# Patient Record
Sex: Male | Born: 1950 | Race: White | Hispanic: No | Marital: Married | Smoking: Current every day smoker
Health system: Southern US, Community
[De-identification: ages and names within clinical notes are randomized; demographics above are authoritative.]

## PROBLEM LIST (undated history)

## (undated) DIAGNOSIS — I219 Acute myocardial infarction, unspecified: Secondary | ICD-10-CM

## (undated) DIAGNOSIS — I639 Cerebral infarction, unspecified: Secondary | ICD-10-CM

## (undated) DIAGNOSIS — I779 Disorder of arteries and arterioles, unspecified: Secondary | ICD-10-CM

## (undated) DIAGNOSIS — I10 Essential (primary) hypertension: Secondary | ICD-10-CM

## (undated) DIAGNOSIS — G4733 Obstructive sleep apnea (adult) (pediatric): Secondary | ICD-10-CM

## (undated) DIAGNOSIS — N200 Calculus of kidney: Secondary | ICD-10-CM

## (undated) DIAGNOSIS — M549 Dorsalgia, unspecified: Secondary | ICD-10-CM

## (undated) DIAGNOSIS — G629 Polyneuropathy, unspecified: Secondary | ICD-10-CM

## (undated) DIAGNOSIS — I4892 Unspecified atrial flutter: Secondary | ICD-10-CM

## (undated) DIAGNOSIS — G8929 Other chronic pain: Secondary | ICD-10-CM

## (undated) DIAGNOSIS — I739 Peripheral vascular disease, unspecified: Secondary | ICD-10-CM

## (undated) DIAGNOSIS — S069XAA Unspecified intracranial injury with loss of consciousness status unknown, initial encounter: Secondary | ICD-10-CM

## (undated) DIAGNOSIS — E785 Hyperlipidemia, unspecified: Secondary | ICD-10-CM

## (undated) DIAGNOSIS — S329XXA Fracture of unspecified parts of lumbosacral spine and pelvis, initial encounter for closed fracture: Secondary | ICD-10-CM

## (undated) DIAGNOSIS — S065X9A Traumatic subdural hemorrhage with loss of consciousness of unspecified duration, initial encounter: Secondary | ICD-10-CM

## (undated) DIAGNOSIS — S42309A Unspecified fracture of shaft of humerus, unspecified arm, initial encounter for closed fracture: Secondary | ICD-10-CM

## (undated) DIAGNOSIS — S069X9A Unspecified intracranial injury with loss of consciousness of unspecified duration, initial encounter: Secondary | ICD-10-CM

## (undated) DIAGNOSIS — S065XAA Traumatic subdural hemorrhage with loss of consciousness status unknown, initial encounter: Secondary | ICD-10-CM

## (undated) DIAGNOSIS — I2510 Atherosclerotic heart disease of native coronary artery without angina pectoris: Secondary | ICD-10-CM

## (undated) HISTORY — DX: Atherosclerotic heart disease of native coronary artery without angina pectoris: I25.10

## (undated) HISTORY — DX: Unspecified atrial flutter: I48.92

## (undated) HISTORY — DX: Acute myocardial infarction, unspecified: I21.9

---

## 2003-07-11 ENCOUNTER — Encounter: Payer: Self-pay | Admitting: *Deleted

## 2003-07-11 ENCOUNTER — Observation Stay (HOSPITAL_COMMUNITY): Admission: EM | Admit: 2003-07-11 | Discharge: 2003-07-12 | Payer: Self-pay | Admitting: Emergency Medicine

## 2003-07-12 ENCOUNTER — Encounter: Payer: Self-pay | Admitting: Family Medicine

## 2003-07-20 ENCOUNTER — Encounter: Payer: Self-pay | Admitting: General Surgery

## 2003-07-20 ENCOUNTER — Ambulatory Visit (HOSPITAL_COMMUNITY): Admission: RE | Admit: 2003-07-20 | Discharge: 2003-07-20 | Payer: Self-pay | Admitting: General Surgery

## 2006-01-14 ENCOUNTER — Ambulatory Visit (HOSPITAL_COMMUNITY): Admission: RE | Admit: 2006-01-14 | Discharge: 2006-01-14 | Payer: Self-pay | Admitting: General Surgery

## 2006-02-10 ENCOUNTER — Emergency Department (HOSPITAL_COMMUNITY): Admission: EM | Admit: 2006-02-10 | Discharge: 2006-02-10 | Payer: Self-pay | Admitting: Emergency Medicine

## 2006-08-26 ENCOUNTER — Ambulatory Visit (HOSPITAL_COMMUNITY): Admission: RE | Admit: 2006-08-26 | Discharge: 2006-08-26 | Payer: Self-pay | Admitting: Family Medicine

## 2006-10-25 ENCOUNTER — Emergency Department (HOSPITAL_COMMUNITY): Admission: EM | Admit: 2006-10-25 | Discharge: 2006-10-25 | Payer: Self-pay | Admitting: *Deleted

## 2006-10-31 ENCOUNTER — Inpatient Hospital Stay (HOSPITAL_COMMUNITY): Admission: EM | Admit: 2006-10-31 | Discharge: 2006-11-05 | Payer: Self-pay | Admitting: Emergency Medicine

## 2006-12-22 ENCOUNTER — Ambulatory Visit (HOSPITAL_COMMUNITY): Admission: RE | Admit: 2006-12-22 | Discharge: 2006-12-22 | Payer: Self-pay | Admitting: Internal Medicine

## 2007-01-29 ENCOUNTER — Emergency Department (HOSPITAL_COMMUNITY): Admission: EM | Admit: 2007-01-29 | Discharge: 2007-01-29 | Payer: Self-pay | Admitting: Emergency Medicine

## 2007-05-11 ENCOUNTER — Ambulatory Visit (HOSPITAL_COMMUNITY): Admission: RE | Admit: 2007-05-11 | Discharge: 2007-05-11 | Payer: Self-pay | Admitting: Internal Medicine

## 2007-06-08 ENCOUNTER — Ambulatory Visit (HOSPITAL_COMMUNITY): Admission: RE | Admit: 2007-06-08 | Discharge: 2007-06-08 | Payer: Self-pay | Admitting: Urology

## 2007-06-15 ENCOUNTER — Ambulatory Visit (HOSPITAL_COMMUNITY): Admission: RE | Admit: 2007-06-15 | Discharge: 2007-06-15 | Payer: Self-pay | Admitting: Urology

## 2008-03-27 ENCOUNTER — Ambulatory Visit (HOSPITAL_COMMUNITY): Admission: RE | Admit: 2008-03-27 | Discharge: 2008-03-27 | Payer: Self-pay | Admitting: Urology

## 2008-04-27 ENCOUNTER — Encounter
Admission: RE | Admit: 2008-04-27 | Discharge: 2008-05-29 | Payer: Self-pay | Admitting: Physical Medicine & Rehabilitation

## 2008-04-30 ENCOUNTER — Ambulatory Visit: Payer: Self-pay | Admitting: Physical Medicine & Rehabilitation

## 2008-05-29 ENCOUNTER — Ambulatory Visit: Payer: Self-pay | Admitting: Physical Medicine & Rehabilitation

## 2008-07-23 ENCOUNTER — Encounter
Admission: RE | Admit: 2008-07-23 | Discharge: 2008-10-21 | Payer: Self-pay | Admitting: Physical Medicine & Rehabilitation

## 2008-07-24 ENCOUNTER — Ambulatory Visit: Payer: Self-pay | Admitting: Physical Medicine & Rehabilitation

## 2008-08-20 ENCOUNTER — Ambulatory Visit: Payer: Self-pay | Admitting: Physical Medicine & Rehabilitation

## 2008-09-24 ENCOUNTER — Ambulatory Visit: Payer: Self-pay | Admitting: Physical Medicine & Rehabilitation

## 2008-11-15 ENCOUNTER — Encounter
Admission: RE | Admit: 2008-11-15 | Discharge: 2009-02-13 | Payer: Self-pay | Admitting: Physical Medicine & Rehabilitation

## 2008-11-19 ENCOUNTER — Ambulatory Visit: Payer: Self-pay | Admitting: Physical Medicine & Rehabilitation

## 2009-01-02 ENCOUNTER — Ambulatory Visit: Payer: Self-pay | Admitting: Physical Medicine & Rehabilitation

## 2009-02-01 ENCOUNTER — Ambulatory Visit: Payer: Self-pay | Admitting: Physical Medicine & Rehabilitation

## 2009-02-19 ENCOUNTER — Encounter
Admission: RE | Admit: 2009-02-19 | Discharge: 2009-05-20 | Payer: Self-pay | Admitting: Physical Medicine & Rehabilitation

## 2009-03-01 ENCOUNTER — Ambulatory Visit: Payer: Self-pay | Admitting: Physical Medicine & Rehabilitation

## 2009-03-29 ENCOUNTER — Ambulatory Visit: Payer: Self-pay | Admitting: Physical Medicine & Rehabilitation

## 2009-04-26 ENCOUNTER — Ambulatory Visit: Payer: Self-pay | Admitting: Physical Medicine & Rehabilitation

## 2009-05-14 ENCOUNTER — Encounter
Admission: RE | Admit: 2009-05-14 | Discharge: 2009-08-12 | Payer: Self-pay | Admitting: Physical Medicine & Rehabilitation

## 2009-05-29 ENCOUNTER — Ambulatory Visit: Payer: Self-pay | Admitting: Physical Medicine & Rehabilitation

## 2009-06-26 ENCOUNTER — Ambulatory Visit: Payer: Self-pay | Admitting: Physical Medicine & Rehabilitation

## 2009-07-26 ENCOUNTER — Ambulatory Visit: Payer: Self-pay | Admitting: Physical Medicine & Rehabilitation

## 2010-06-18 ENCOUNTER — Ambulatory Visit: Payer: Self-pay | Admitting: Vascular Surgery

## 2010-11-02 ENCOUNTER — Encounter: Payer: Self-pay | Admitting: Family Medicine

## 2011-01-28 ENCOUNTER — Other Ambulatory Visit (HOSPITAL_COMMUNITY): Payer: Self-pay | Admitting: Urology

## 2011-01-28 ENCOUNTER — Ambulatory Visit (HOSPITAL_COMMUNITY)
Admission: RE | Admit: 2011-01-28 | Discharge: 2011-01-28 | Disposition: A | Discharge status: Home / Self Care | Payer: Medicaid Other | Source: Ambulatory Visit | Attending: Urology | Admitting: Urology

## 2011-01-28 DIAGNOSIS — N201 Calculus of ureter: Secondary | ICD-10-CM

## 2011-01-28 DIAGNOSIS — R109 Unspecified abdominal pain: Secondary | ICD-10-CM | POA: Insufficient documentation

## 2011-01-28 DIAGNOSIS — Z87442 Personal history of urinary calculi: Secondary | ICD-10-CM | POA: Insufficient documentation

## 2011-02-24 NOTE — Assessment & Plan Note (Signed)
[name] is here regarding his chronic gait deficits and left-sided  pain.  We started Keppra and Flector patches.  He reported swelling and  stopped both of them.  He also states he has problems swallowing Keppra.  He brought his bottle with him today and the pharmacist mistakenly  prescribed 750 mg instead of the 250 I wrote for.  He also reports some  muscle spasms with the medication and received Valium from his family  doctor.  Currently, he is rating his pain 7-9/10 and describes it as  sharp, burning, stabbing, aching, tingling, and constant.  Pain  interferes with general activity, relations with others, and enjoyment  of life on a moderate to severe level.  Sleep is poor.   REVIEW OF SYSTEMS:  Notable for multiple issues, all listed in the  health and history section of the chart.  Mood has been stable.  He has  had no recent falls.   SOCIAL HISTORY:  The patient lives alone.  He is still smoking a pack of  cigarettes per day.  His ex-wife is with him here today.   PHYSICAL EXAMINATION:  VITAL SIGNS:  Blood pressure is 130/71, pulse 73,  respiratory rate 18, and sating 96% on room air.  GENERAL:  The patient is pleasant, alert and oriented x3.  He is alert  and appropriate today.  He walks with a shuffling gait.  He has  difficulty advancing the left leg.  He has ongoing tightness of the knee  and ankle with tone rated at 2/4.  Strength was 1-2/5 at the knee and  ankle.  He is 2/5 at the hip, perhaps.  EXTREMITIES:  Right lower extremity is 4-4+/5.  Left wrist and hand tone  was 2-3/4.  He has some tightness as well of his shoulder.  Proximal  muscle movement in the shoulders is 4/5.  He had decreased distal  movement in the left.  Balance is poor.  HEART:  Regular.  CHEST:  Clear.  ABDOMEN:  Soft and nontender.   The patient continues to lack insight and awareness for the most part.  He likes to joke about his issues and make excuses for problems he is   experiencing.   ASSESSMENT:  1. Persistent left lower extremity pain most consistent with      neurological/neuropathic pain from his strokes and brain injury      plus or minus related to his back.  He also has peripheral      neuropathy.  2. Spastic left hemiparesis.  3. Gait disorder.  4. Obesity.  5. Anxiety disorder.  6. Poor safety judgment.   PLAN:  1. We will increase his Neurontin which he restarted to 100 mg b.i.d.  2. Begin slow titration of Baclofen going up to 10 mg b.i.d. after 3      weeks.  3. Send him for PT and OT at [facility] to work on balance,      strengthening, range of motion, spasticity management, and edema      control.  4. Consider Botox injections.  5. I will see him back in about 2 months.      Ranelle Oyster, M.D.  Electronically Signed     ZTS/MedQ  D:  05/29/2008 11:15:30  T:  05/30/2008 03:09:46  Job #:  [id number]   cc:   Prescott Parma  Fax: [phone]

## 2011-02-24 NOTE — Assessment & Plan Note (Signed)
[name] is back regarding his left side spasticity and low back pain.  He  had good results with Botox and he has had increased use of his left  upper extremity.  His left arm pain seems to be diminished.  He is  complaining of spasms down his left leg and pain down his right buttock  going down the leg.  He has had epidural injections through [facility], but states that these were not helpful.  I do not have  details of his most recent lumbar film.  The patient rates his pain a 7-  8/10.  He is still taking 6 or 7 Norco a day for pain.  The patient  states the pain is constant and interferes with his general activity and  enjoyment of life on a moderate-to-severe level.  Pain is usually worse  with walking, bending, sitting, some time standing.  He uses a cane for  balance.  He has a left shoulder harness for his arm support.   REVIEW OF SYSTEMS:  Notable for numbness, tremor, tingling, trouble  walking, weight gain, night sweats, high sugars, limb swelling,  shortness of breath, sleep apnea.  Full review is in the written health  and history section in the chart.   SOCIAL HISTORY:  The patient is married, but living alone.  He smokes  about 1 pack a day.   PHYSICAL EXAMINATION:  VITAL SIGNS:  Blood pressure 111/63, pulse is  106, respiratory rate 20, sating 96% on room air.  GENERAL:  The patient is pleasant, alert, and oriented x3.  Speech is  slightly dysarthric.  MUSCULOSKELETAL:  Pain over palpation in the right piriformis area  worsened with hip range of motion as well as direct pressure sitting on  the area.  The hip flexion caused some discomfort.  He is able to bend  in the seated position to about 30-40 degrees before pain set in.  Symptoms are overall fairly vague there.  Left upper extremity  definitely had improved from a spasticity standpoint with 1-2/5 tone at  the fingers and wrist, 1-1+/4 tone at the left elbow.  Tone in the left  lower extremity  remains 1-2/4.  Strength in the left upper extremity is  2+/5 proximal to 1+/5 distally.  Left lower extremity is 2-3/5.  HEART:  Regular.  CHEST:  Clear.  ABDOMEN:  Soft, nontender.  Remains is a extremely abdominally obese.  He is 1+ extremity edema in the left lower extremity today.   ASSESSMENT:  1. Spastic left-sided hemiparesis.  2. Peripheral neuropathy.  3. Chronic low back pain with questionable degenerative disk disease      and spondylosis.  4. Questionable right piriformis syndrome.  5. Gait disorder.  6. Obesity.  7. Anxiety.   PLAN:  1. Continue low-dose Neurontin.  2. We will start low-dose fentanyl patch 12 mcg q.72 h.  3. I feel that his Norco 10/325, #180 today.  I explained to him that      he will need to decrease the number.  He is using each month.  That      is the purpose of starting on the patch to get better baseline pain      control.  4. The patient is not anxious to pursue any injections to the prior      elevation of his blood sugars in the past.  5. I will see the patient back in about a month for Botox injections  to the left biceps, FDP, and FDS muscles totaling 300 units.      Ranelle Oyster, M.D.  Electronically Signed     ZTS/MedQ  D:  11/19/2008 14:45:24  T:  11/20/2008 04:03:24  Job #:  [id number]   cc:   Prescott Parma  Fax: [phone]

## 2011-02-24 NOTE — Procedures (Signed)
NAMEJHALEN, [name] NO.:  [id number]   MEDICAL RECORD NO.:  [id number]           PATIENT TYPE:   LOCATION:                                 FACILITY:   PHYSICIAN:  Ranelle Oyster, M.D.DATE OF BIRTH:  [birth year]   DATE OF PROCEDURE:  DATE OF DISCHARGE:                               OPERATIVE REPORT   PROCEDURE:  Botox injection, diagnostic code is 342.12, spastic  hemiparesis, nondominant limb.   After extensive discussion and description of procedure, benefits, and  side effects, the patient agreed to have Botox injection today.  He  signed informed consent.  We utilized EMG guidance to inject 3 different  muscles today, the left FDS, left FDP, and biceps brachii.  We used  approximately 100 units of botulinum toxin A in each muscle.  Each 100  unit was diluted in 1 mL of preservative-free normal saline.  The  patient tolerated without issues.  He was given post-injection  instructions.  We will send him to outpatient OT to work on aggressive  left upper extremity range of motion, modality, splinting, etc.   He can continue with Norco for breakthrough pain as well as Neurontin at  night.  Incidentally, he had recent kidney stone problems, which are  slowly improving.   I will see him back in 2 months' time.      Ranelle Oyster, M.D.  Electronically Signed     ZTS/MEDQ  D:  09/24/2008 15:26:47  T:  09/25/2008 06:05:07  Job:  [id number]   cc:   Prescott Parma  Fax: [phone]

## 2011-02-24 NOTE — Procedures (Signed)
CAROTID DUPLEX EXAM   INDICATION:  Carotid artery disease.   HISTORY:  Diabetes:  Yes.  Cardiac:  No.  Hypertension:  Yes.  Smoking:  Yes.  Previous Surgery:  No.  CV History:  Asymptomatic.  Amaurosis Fugax , Paresthesias , Hemiparesis                                       RIGHT             LEFT  Brachial systolic pressure:                           162  Brachial Doppler waveforms:                           Within normal  limits  Vertebral direction of flow:                          Antegrade  DUPLEX VELOCITIES (cm/sec)  CCA peak systolic                                     174  ECA peak systolic                                     276  ICA peak systolic                                     143  ICA end diastolic                                     51  PLAQUE MORPHOLOGY:                                    Heterogenous  PLAQUE AMOUNT:                                        Moderate  PLAQUE LOCATION:                                      CCA, ICA, ECA   IMPRESSION:  1. Known right internal carotid artery occlusion.  2. Left internal carotid artery velocities show evidence of a 40% to      59% stenosis.  3. Left common carotid artery stenosis noted.   ___________________________________________  Di Kindle. Edilia Bo, M.D.   EM/MEDQ  D:  06/18/2010  T:  06/18/2010  Job:  [id number]

## 2011-02-24 NOTE — Assessment & Plan Note (Signed)
[name] is back regarding his chronic gait deficit and left-sided pain.  He is unable to tolerate the baclofen as he stated they increased his  spasms rather than decrease them.  He did not go to outpatient therapy  due to transportation issues and kidney stone.  He started the  Neurontin at 100 mg at bedtime and it seems to have helped his burning  pain.  He rates the pain at 7/10 on average.  He frequently complains of  a lot of pain in the left shoulder as well as the hip and leg after a  fall.  Pain interferes with general activity, relations with others,  enjoyment of life in a mild-to-severe level.  Sleep is fair now.   REVIEW OF SYSTEMS:  Notable for numbness and spasms in the left side,  tingling, night sweats, urinary retention, wheezing, and cough with  recent bronchitis apparently.  Other pertinent positives are above and  full review is in the written health and history section in the chart.   SOCIAL HISTORY:  The patient is married but lives separately and smokes  half a pack of cigarettes per day.   PHYSICAL EXAMINATION:  VITAL SIGNS:  Blood pressure is 111/68, pulse is  75, respiratory rate 18 and he is saturating 97% on room air.  The patient walks with shuffling gait and is somewhat unstable.  Left  leg is difficult to advance.  It has continued tightness in left knee  and ankle, rated at 2/4.  Left wrist and elbow also 2-3/4.  Strength is  1/2 in the left knee and ankle and 2- to 2 at the hip with flexion.  Left upper extremity is 1-2/5 strength.  The patient had pain with  rotational movements and impingement maneuvers in the left shoulder  today.  Speech remains dysarthric.  The patient has poor insight and  awareness overall.  He can be a little bit irritable at times.  HEART:  Regular.  CHEST:  Clear.  ABDOMEN:  Soft and nontender.  EXTREMITIES:  The patient had bruises down the left side and leg today.   ASSESSMENT:  1. Persistent left lower extremity pain and  spasticity related to      prior strokes and brain injury.  2. Peripheral neuropathy.  3. Dysplastic left hemiparesis.  4. Gait disorder.  5. Obesity.  6. Anxiety disorder.   PLAN:  1. Continue Neurontin 100 mg nightly.  2. Stop baclofen.  3. We will set the patient up for Botox injections in left biceps,      pronator teres, and flexor digitorum sublimis and flexor digitorum      profundus.  4. After an informed consent, we injected the left shoulder via      lateral approach, 40 mg Kenalog and 3 mL of 1% lidocaine.  5. We look at home health therapies after Botox injections.  6. I gave him 120 of Norco 10/325 today.      Ranelle Oyster, M.D.  Electronically Signed     ZTS/MedQ  D:  07/24/2008 12:56:02  T:  07/25/2008 03:34:57  Job #:  [id number]   cc:   Prescott Parma  Fax: [phone]

## 2011-02-24 NOTE — Assessment & Plan Note (Signed)
OFFICE VISIT   [name], [name]  DOB:  1950/12/01                                       06/18/2010  CHART#:[id number]   This patient has a known right internal carotid artery occlusion and we  have been following a moderate left carotid stenosis.  I have not seen  him since February of 2007.  He had a previous right hemispheric stroke  associated with significant left-sided weakness.  He has had no new  change in his symptoms.  He continues to have weakness on the left upper  extremity and left lower extremity.  He is ambulatory with a cane.  He  has had no expressive or receptive aphasia, no amaurosis fugax.   His past medical history is significant for adult onset diabetes,  hypertension and hypercholesterolemia.  He denies any history of  previous myocardial infarction, history of congestive heart failure or  history of COPD.   SOCIAL HISTORY:  He is separated.  He does smoke a pack per day of  cigarettes or less.   REVIEW OF SYSTEMS:  CARDIOVASCULAR:  He has had no chest pain, chest  pressure, palpitations or arrhythmias.  He does admit to some  claudication.  He has had no history of DVT.  PULMONARY:  He does have a history of sleep apnea and is on CPAP.  GU:  He does have some urinary frequency and nocturia.  MUSCULOSKELETAL:  He does have joint pain and muscle pain.   PHYSICAL EXAMINATION:  General:  This is a pleasant 60 year old  gentleman who appears his stated age.  Vital signs:  Blood pressure is  134/65, heart rate is 77 and temperature is 97.8.  Lungs:  Are clear  bilaterally to auscultation without rales, rhonchi or wheezing.  Cardiovascular:  I do not detect any carotid bruits.  He has a regular  rate and rhythm.  He has no significant peripheral edema.  Both feet are  warm and well-perfused.  Abdomen:  Soft with normal pitched bowel  sounds.  Neurologic:  He has profound left upper extremity weakness and  also left lower extremity  weakness but he is ambulatory with a brace on  the left leg.  Skin:  There are no ulcers or rashes.   I have independently interpreted his carotid duplex scan today which  shows a 40%-59% left carotid stenosis.  I compared the velocities to his  previous study from 2 years ago and there has been no significant  change.   Will continue to obtain yearly followup duplex scans.  I plan on seeing  him back in 2 years.  He understands we would not consider left carotid  endarterectomy unless the stenosis progressed to greater than 80%.  He  does know to continue taking his aspirin.     Di Kindle. Edilia Bo, M.D.  Electronically Signed   CSD/MEDQ  D:  06/18/2010  T:  06/19/2010  Job:  [id number]

## 2011-02-24 NOTE — Assessment & Plan Note (Signed)
[name] is back regarding his chronic back and left-sided pain as related  to his neurological disorders.  He decided that he does not want Botox.  He has spoken to many friends and read the information about the  medication and now has concerns about side effects.  He has had recent  surgery for kidney stones.  He has been doing some therapy at home and  is moving better.  He is requesting 8 Norco per day.  Left shoulder  injection may have helped him at last visit.  He rates his pain 7/10 in  general, describes as constant aching.  Pain interferes with his general  activity, relations with others, and enjoyment of life on a moderate  level.   REVIEW OF SYSTEMS:  This patient reports multiple issues such as  numbness, tingling, some weight gain, urinary retention, lower extremity  swelling, occasional dizziness, and spasms.  Full review is in the  written health and history section.   SOCIAL HISTORY:  The patient is married, but lives separately.  He is  smoking 1/2-1 pack of cigarettes per day.   PHYSICAL EXAMINATION:  VITAL SIGNS:  Blood pressure is 136/70, pulse 81,  respiratory rate 20, and he is sating 94% on room air.  GENERAL:  The patient is pleasant, alert and oriented x3.  The patient  is generally alert.  His speech is dysarthric.  He walks with fair  balance although he tends to rotate the left leg externally to help  clear it.  He has his left lower upright AFO in place.  Left upper  extremity is moving a bit better with 2/5 strength proximal to 1/5 to  1+/5 distally.  Tone is generally 2-3/4 in the left upper extremity.  It  is 1-2 in the left lower extremity.  The patient has poor insight and  awareness.  He can still be quite irritable and cranky at times although  he is a bit more pleasant overall today.  HEART:  Regular.  CHEST:  Clear.  ABDOMEN:  Soft and nontender.  EXTREMITIES:  1+ edema in the left lower extremity.   ASSESSMENT:  1. Persistent left lower  extremity pain spasticities related to stroke      and brain injury.  2. Peripheral neuropathy.  3. Low back pain.  4. Gait disorder.  5. Obesity.  6. Anxiety disorder.   PLAN:  1. Continue Neurontin 100 mg nightly.  2. Discussed Botox injections today.  The patient is still hesitant to      go forward with these.  In lieu of this, we will try some tendon      stretching at home.  He would like to go to outpatient therapy, but      wants to have his kidney stones resolved first before proceeding.  3. Agreed to write for 6 Norco 10/325 per day.  We will try to slowly      wean him down.  4. Overall, options are limited due to patient's intolerance and      refusal to use particular treatments and medications.  We will do      the best we can with conservative measures.  I will see him back in      2 months.      Ranelle Oyster, M.D.  Electronically Signed     ZTS/MedQ  D:  08/20/2008 13:14:40  T:  08/21/2008 02:14:06  Job #:  [id number]   cc:   Prescott Parma  Fax:  [phone] 

## 2011-02-24 NOTE — H&P (Signed)
[name], [name]                ACCOUNT NO.:  [id number]   MEDICAL RECORD NO.:  [id number]          PATIENT TYPE:  AMB   LOCATION:  DAY                           FACILITY:  [facility]   PHYSICIAN:  Dennie Maizes, M.D.   DATE OF BIRTH:  04-17-1951   DATE OF ADMISSION:  06/08/2007  DATE OF DISCHARGE:  LH                              HISTORY & PHYSICAL   CHIEF COMPLAINT:  Left lower quadrant abdominal pain, large left renal  calculus.   HISTORY OF PRESENT ILLNESS:  This 60 year old male was referred to me  from the emergency room at [facility].  He had left lower  quadrant abdominal pain and left flank pain about three months ago.  He  also had mild hematuria.  A noncontrast CT scan of the abdomen and  pelvis revealed a 9 mm sized left renal calculus located in the  ureteropelvic junction with obstruction.  Moderate left hydronephrosis  also noted.  Multiple nonobstructing renal calculi are noted in the left  kidney.  The largest stone measured about 13 mm in size.  Patient has  undergone ESWL of the left obstructing ureteral calculus.  The stone was  fragmented, and he has passed stone fragments.  He also has passed  several stones from the left kidney.  He still has a 13 x 9 mm sized  stone in the left kidney.  He is brought to the hospital day for  extracorporeal shock wave lithotripsy of the large left renal calculus.  The patient denied having any fevers, chills, sweating, difficulty,  adverse hematuria at present.  Urinary frequency x3-4 and nocturia x3-4.   PAST MEDICAL HISTORY:  1. History of recurrent ureterolithiasis, status post ESWL of left      ureterolithiasis in April, 2008.  2. Diabetes mellitus.  3. Hypertension.  4. Hypercholesterolemia.  5. Status post CVA x3 in 1999.  6. History of pelvic fracture.   MEDICATIONS:  1. Hydrocodone with APAP p.r.n. pain.  2. Diltiazem 240 mg p.o. daily.  3. Xanax 1 mg 1 p.o. 3 times a day.  4. Glucophage 500 mg 1 p.o.  daily.  5. Crestor 1 p.o. daily.  6. Actos 40 mg 1 p.o. daily.  7. Nexium 40 mg 1 p.o. daily.  8. Paxil 10 mg 1 p.o. daily.  9. Plavix 75 mg 1 p.o. daily, which had been discontinued for the      lithotripsy.   ALLERGIES:  MORPHINE, FLOMAX.   PHYSICAL EXAMINATION:  HEENT:  Normal.  Patient has left hemiparesis.  LUNGS:  Clear to auscultation.  HEART:  Regular rate and rhythm.  No murmurs.  ABDOMEN:  Soft.  No palpable flank mass or costovertebral angle  tenderness.  The liver is not palpable.  Penis and testes are normal.  RECTAL:  Prostate 40 gm size.   IMPRESSION:  Left renal calculus with left flank pain.   PLAN:  Extracorporeal shock wave lithotripsy to the left renal calculus  with  iv sedation in the hospital.  I informed the patient and his  family regarding the diagnosis, operative details, alternative  treatments, outcome, possible risks and complications.  He has agreed  for the procedure to be done.  Dennie Maizes, M.D.  Electronically Signed     SK/MEDQ  D:  06/08/2007  T:  06/08/2007  Job:  [id number]   cc:   Prescott Parma  Fax: [phone]

## 2011-02-24 NOTE — Assessment & Plan Note (Signed)
[name] is back regarding his spastic left hemiparesis and pain syndrome.  He has done well with the 15 mg of Opana ER, but for whatever reason he  was switched back to the 10.  I think it may have been an error by my  nursing staff which I did not catch before one of his nursing visits.  He liked the 15 and was doing better with this.  He is able to use a bit  less hydrocodone as well.  He rates his pain 7-8/10.  He is working on  some stretching at home but not over aggressively.   REVIEW OF SYSTEMS:  Notable for numbness, trouble walking, muscle  spasms, weight loss, occasional sugar increases, and limb swelling in  left leg.  Full 14-point review is in the written health and history  section.  Other pertinent positives are above.   SOCIAL HISTORY:  Unchanged.  He is separated, but wife lives with him in  his house.  He smokes a pack of cigarettes per day.   PHYSICAL EXAMINATION:  Blood pressure is 131/72, pulse is 76,  respiratory rate 18.  He is sating 96% on room air.  The patient is  pleasant, alert and oriented x3.  He is jovial today.  Speech is  intelligible but dysarthric still.  Strength is still 1-2/5 proximal  greater than distal in the left upper extremity where it is 2-3/5 left  lower extremity.  He still has tone at 1+ to 2 in the left upper and 1/4  left lower today.  Reflexes are 3+.  There is trace edema seen in the  left arm and trace to 1+ left leg today.  Heart is regular rate.  Chest  is clear.  Abdomen is soft, nontender.  Cognitively, he is intact.  The  patient's left AFOs is in place.   ASSESSMENT:  1. Spastic left hemiparesis.  2. Peripheral neuropathy.  3. Chronic low back pain related to spondylosis and disk disease.  4. Right piriformis and greater trochanter syndrome.  5. Gait disorder.  6. Obesity.   PLAN:  1. Resume Opana ER at 15 mg q.12 h.  2. To reduced Norco to 10/325 one q.4 h. p.r.n. #150.  3. Encourage stretching and better health  hygiene.  4. We will see him back in 3 months with me, in 1 month in the nursing      clinic.      Ranelle Oyster, M.D.  Electronically Signed     ZTS/MedQ  D:  05/29/2009 11:19:16  T:  05/30/2009 00:42:33  Job #:  [id number]

## 2011-02-24 NOTE — Procedures (Signed)
NAMEJORDIN, [name]                ACCOUNT NO.:  [id number]   MEDICAL RECORD NO.:  [id number]          PATIENT TYPE:  REC   LOCATION:  [facility]                          FACILITY:  [facility]   PHYSICIAN:  Ranelle Oyster, M.D.DATE OF BIRTH:  Jun 20, 1951   DATE OF PROCEDURE:  01/02/2009  DATE OF DISCHARGE:                               OPERATIVE REPORT   PROCEDURE:  Botox injection, diagnostic code 342.12.   DESCRIPTION OF PROCEDURE:  After informed consent, preparation of skin  with isopropyl alcohol.  We utilized EMG stimulation to localize the FDP  and FDS musculature as well as the biceps.  The patient had substantial  involuntary activity of these muscle groups and we injected a  approximately 150 units into the forearm flexor bed, as well as 150  units into the biceps bed.  The patient tolerated well without side  effects and agreed for therapy in about 10-14 days.   PLAN:  1. Refill Norco 10/325 #180 and we started OPANA ER 5 mg q.12 h.      discussing potential side effects.  2. I will see him back in about 2 months with 1 month in Nurse Clinic.      Ranelle Oyster, M.D.  Electronically Signed     ZTS/MEDQ  D:  01/02/2009 13:22:46  T:  01/03/2009 02:24:05  Job:  [id number]   cc:   Prescott Parma  Fax: [phone]

## 2011-02-24 NOTE — Assessment & Plan Note (Signed)
[name] is back regarding his multiple pain issues.  His left arm has  responded somewhat to the Botox.  However, he is not actively stretching  his arm on a regular basis.  His right leg and hip given him some  problems with pain radiating down the leg.  It seems to bother him more  when he sits for a prolonged periods of time.  He rates his pain as 7/10  today, described it as aching.  He is on the Opana ER 10 mg q.12 h.  currently with the Norco for breakthrough pain.  He is still using 6 of  these a day.  He does feel that he is using less somehow.   REVIEW OF SYSTEMS:  Notable for numbness, confusion, loss of taste and  dizziness, weight gain with sugars, limb swelling still on the left arm  and leg.  In fact, he is seeing Dr. Lajoyce Corners regarding his left foot in the  next week or two.  Full review is in the written health and history  section of the chart.   SOCIAL HISTORY:  Unchanged.  The patient's wife is with him today, but  he is separated.  He is still smoking a pack of cigarettes per day.   PHYSICAL EXAMINATION:  VITAL SIGNS:  Blood pressure is 123/73, pulse is  61, respiratory rate 18, he is sating 95% on room air.  GENERAL:  The patient is pleasant, alert, and oriented x3.  Affect is  generally appropriate.  Speech is clear today.  Strength is stable in  the left upper extremity at 2+/5 proximally and 1/5 distally.  Tone  underlying is 1-2/4 still, but closer to 1.  Left lower extremity  strength is 2-3/5 with some tone at 1/4 underlying.  NEURO:  Sensory exam is unchanged.  Cranial nerve exam is stable.  HEART:  Regular.  CHEST:  Clear.  ABDOMEN:  Soft, nontender.  EXTREMITIES:  He has 1+ edema in the left lower extremity and trace  edema in the left upper extremity today.  Right hip is painful with  palpation of the greater trochanter just posteriorly.  The patient also  has pain there with straight leg raising and rotation of the hip.  The  patient walks with his left AFO  and straight cane and is fairly stable.  He needs extra time to stand from a sitting position.   ASSESSMENT:  1. Spastic left hemiparesis.  2. Peripheral neuropathy.  3. Chronic low back pain related to spondylosis and disk disease.  4. Right piriformis/greater trochanter syndrome.  5. Gait disorder.  6. Obesity.   PLAN:  1. We will stay with Opana ER, but increase to 15 mg q.12 h.  2. I want to see his Norco continue to decrease, I wanted to see him      down to 3-4 a day in the near future for breakthrough pain.  3. He needs to stay after his exercises for stretching, particularly      in the left upper extremities if he hopes to maintain some of the      progress he has made.  4. Recommended ice and stretching to the right hip as well.  I gave      the patient samples of Flector patches.  Could      consider injection to greater trochanter, but they are concerned      with elevating his sugars obviously.  5. I will see him back in 2 months with  52-month nurse followup.      Ranelle Oyster, M.D.  Electronically Signed     ZTS/MedQ  D:  03/29/2009 12:01:53  T:  03/30/2009 02:59:37  Job #:  [id number]   cc:   Prescott Parma  Fax: [phone]

## 2011-02-24 NOTE — H&P (Signed)
[name], [name]                ACCOUNT NO.:  [id number]   MEDICAL RECORD NO.:  [id number]          PATIENT TYPE:  AMB   LOCATION:  DAY                           FACILITY:  [facility]   PHYSICIAN:  Dennie Maizes, M.D.   DATE OF BIRTH:  1950-11-25   DATE OF ADMISSION:  05/10/2007  DATE OF DISCHARGE:  LH                              HISTORY & PHYSICAL   CHIEF COMPLAINT:  Left lower quadrant abdominal pain, large left renal  calculus.   HISTORY OF PRESENT ILLNESS:  This 60 year old male is referred to me  from the ER at [facility].  He had left lower quadrant  abdominal pain and left flank pain about 3 months ago.  He also had mild  hematuria.  A noncontrast CT scan of the abdomen and pelvis revealed a 9-  mm-size left renal calculus located in the ureterovesical junction with  obstruction.  Moderate left hydronephrosis was noted.  Multiple  nonobstructive renal stones were noted in the left kidney; the largest  one measured about 13 mm in size.   The patient has undergone ESL of the left obstructing ureteral calculus.  Stone was fragmented and he has passed the stone fragments.  He also has  passed several stones from the left kidney, but he still has the 13 x 9-  mm stone in the left kidney.  He is brought to the [facility] today  for extracorporeal shock wave lithotripsy of the large left renal  calculus.   The patient denied having any fevers, chills, voiding difficulty or  gross hematuria present.  He has urinary frequency x3-4 and nocturia x3-  4.   PAST MEDICAL HISTORY:  1. Status post ESL of left ureteral calculus in April 2008.  2. Diabetes mellitus.  3. Hypertension.  4. Hypercholesterolemia.  5. Status post CVA x2 in 1999.  6. History of pelvic fracture.   MEDICATIONS:  1. Hydrocodone with APAP.  2. Diltiazem 240 mg one p.o. daily.  3. Xanax 1 mg one p.o. t.i.d.  4. Glucophage 500 mg one p.o. daily.  5. Crestor one p.o. daily.  6. Actos 40 mg one p.o.  daily.  7. Nexium 40 mg one p.o. daily.  8. Ticlid 150 mg one p.o. daily.  9. Paxil 10 mg one p.o. daily.  10.Plavix 75 mg one p.o. daily, which has been discontinued for the      lithotripsy.   ALLERGIES:  MORPHINE and FLOMAX.   FAMILY HISTORY:  Positive for diabetes mellitus.   PHYSICAL EXAMINATION:  HEENT:  Normal.  The patient has left  hemiparesis.  LUNGS:  Clear to auscultation.  HEART:  Regular rate and rhythm, no murmurs.  ABDOMEN:  Soft.  No palpable flank mass or costovertebral angle  tenderness.  Bladder is not palpable.  GU:  Penis and testicles are normal.  RECTAL:  Forty-gram-size benign prostate.   IMPRESSION:  Large left renal calculus with left flank pain.   PLAN:  Extracorporeal shock wave lithotripsy of left renal calculus with  [facility]  I have informed the patient and  his  family regarding the diagnosis, operative details, alternative  treatment, the outcome, possible risks and complications and he has  agreed for the procedure to be done.      Dennie Maizes, M.D.  Electronically Signed     SK/MEDQ  D:  05/11/2007  T:  05/11/2007  Job:  [id number]   cc:   Dr. Louretta Shorten   [facility]
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
## 2011-02-27 NOTE — H&P (Signed)
[name], [name]                ACCOUNT NO.:  [id number]   MEDICAL RECORD NO.:  [id number]          PATIENT TYPE:  INP   LOCATION:  A314                          FACILITY:  [facility]   PHYSICIAN:  Kingsley Callander. Ouida Sills, MD       DATE OF BIRTH:  Jun 14, 1951   DATE OF ADMISSION:  10/31/2006  DATE OF DISCHARGE:  LH                              HISTORY & PHYSICAL   CHIEF COMPLAINT:  Left hip pain.   HISTORY OF PRESENT ILLNESS:  This patient is an 60 year old white male  patient of Dr. Delbert Harness who fell last Monday and has had pain in the  left hip since that time.  He was evaluated initially and found to have  no fracture by x-ray.  He has not been able to walk though and came back  to the emergency room by ambulance because of increased difficulty with  ambulation and with pain.  A CT scan was obtained which revealed  avascular necrosis of the femoral head.  No fracture was identified.   PAST MEDICAL HISTORY:  1. Stroke.  2. Hypertension.  3. Diabetes for 19 years.  4. Closed head injury in 2004.  5. Hyperlipidemia.  6. Left ankle surgery.   MEDICATIONS:  1. Hydrocodone.  2. Acetaminophen 10/325, typically four per day.  3. Cardizem CD 240 mg daily.  4. Xanax 1 mg t.i.d.  5. Glucophage 500 mg daily.  6. Flomax 0.4 mg every other day.  7. Crestor 10 mg daily.  8. Actos 45 mg daily.  9. Plavix 75 mg daily.  10.Triglide 160 mg daily.   ALLERGIES:  MORPHINE.   SOCIAL HISTORY:  He does not smoke, drink, or use drugs.   FAMILY HISTORY:  His mother is alive and has diabetes.  His father died  of a self-inflicted gunshot wound.   REVIEW OF SYSTEMS:  He has been constipated.  He denied vomiting, chest  pain, abdominal pain.  He has intermittent nocturia and urinary  frequency which he self-adjusts his Flomax dose for.   PHYSICAL EXAMINATION:  VITAL SIGNS:  Temperature 98.2, blood pressure  122/74, pulse 67, respirations 24, oxygen saturation 99%.  GENERAL:  Alert.  He does not appear  uncomfortable.  HEENT:  No scleral icterus.  Pharynx is unremarkable.  NECK:  No JVD or thyromegaly.  LUNGS:  Clear.  HEART:  Regular with no murmurs.  ABDOMEN:  Overweight, nontender.  No hepatosplenomegaly.  EXTREMITIES:  He has marked tenderness with minimal internal or external  rotation of the left hip.  He has very limited flexion and extension of  the hip.  No cyanosis, clubbing or edema.  NEUROLOGIC:  He has a partial left hemiparesis and some mild slurring of  his speech.  SKIN:  Intact.   LABORATORY DATA:  White count 6.8, hemoglobin 13.7, platelets 197,000.  Sodium 138, potassium 3.8, bicarb 27, glucose 111, BUN 16, creatinine  0.9, calcium 8.7.   An x-ray of the left hip has revealed no fracture.  A CT scan reveals  avascular necrosis of the left femoral head.  A CT scan  of the brain  revealed an old right middle cerebral artery infarct, an old right basal  ganglia infarct, and a probable old fracture in the medial wall of the  left orbit.   IMPRESSION:  1. Avascular necrosis of the left femoral head.  He will require      hospitalization for further management.  An orthopedic consultation      will be obtained.  2. Diabetes.  Continue metformin and Actos.  3. Hypertension.  Continue diltiazem.  4. History of stroke.  Continue Plavix.      Kingsley Callander. Ouida Sills, MD  Electronically Signed     ROF/MEDQ  D:  11/01/2006  T:  11/01/2006  Job:  [id number]   cc:   Melvyn Novas, MD  Fax: [phone]
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
## 2011-02-27 NOTE — Consult Note (Signed)
[name], [name]                ACCOUNT NO.:  [id number]   MEDICAL RECORD NO.:  [id number]          PATIENT TYPE:  INP   LOCATION:  A314                          FACILITY:  [facility]   PHYSICIAN:  J. Darreld Mclean, M.D. DATE OF BIRTH:  06-25-1951   DATE OF CONSULTATION:  11/02/2006  DATE OF DISCHARGE:                                 CONSULTATION   The patient's MRI shows fractured superior and inferior pubic rami on  the left.  This accounts for his pain.  He does have focal avascular  necrosis of the hip on the left post abductor muscle strain.   PLAN:  Continue his medication.  Continue his PT/OT with walker.  He can  go home when he is able to ambulate.  Very slow progress yesterday with  PT.  He has had a stroke, and it has affected his ambulation.   I can see him in the office approximately a week after discharge.  Call  and make arrangements.           ______________________________  J. Darreld Mclean, M.D.     JWK/MEDQ  D:  11/02/2006  T:  11/02/2006  Job:  [id number]

## 2011-02-27 NOTE — Discharge Summary (Signed)
NAMECORDELRO, GAUTREAU NO.:  [id number]   MEDICAL RECORD NO.:  [id number]          PATIENT TYPE:  INP   LOCATION:  A314                          [facility]ACILITY:  [facility]   PHYSICIAN:  Melvyn Novas, MDDATE OF BIRTH:  16-Jan-1951   DATE OF ADMISSION:  DATE OF DISCHARGE:  LH                               DISCHARGE SUMMARY   The patient is a 60 year old white male who has a history of  hypertension status post CVA, type 2 diabetes, hyperlipidemia and left  ankle surgery with chronic pain, who was admitted complaining of a 5-day  history of left hip pain. An MRI was done considering cervical  radiculopathy; LS spine showed no significant cause for this lesion. X-  rays of his hip were also done which were essentially negative. The  patient presented 3 days later to the ER and an MRI was performed of his  hip which showed fracture of the superior and inferior pubic rami on the  left which probably accounted for his pain, and a focal area of  avascular necrosis of the left hip with a left posterior abductor muscle  strain. The patient was admitted, given IV analgesia, also given  prophylaxis for DVT. Diet was adjusted. He was continued on his  diabetic, hypertensive and antilipidemic medicines. He was seen in  consultation with orthopedic surgery who recommended rehab short term  stay at [facility] center. Patient and wife agreed. Risks and  benefit of proposed plan explained to them.   DISCHARGE MEDICATIONS:  1. Vicodin 7.5/500 x4 daily p.r.n.  2. Cardizem CD 240.  3. Xanax 1 mg p.o. t.i.d.  4. Glucophage 500 mg b.i.d.  5. Flomax 0.4 daily.  6. Crestor 10 mg daily.  7. Actos 45 mg daily.  8. Plavix 75 mg daily.  9. Triad 160 mg daily.   The patient should probably have long term anticoagulation with  Coumadin. Will defer to the attending physician, depending on patient's  level of mobility or immobility in the rehab phase.      Melvyn Novas, MD  Electronically Signed     RMD/MEDQ  D:  11/04/2006  T:  11/04/2006  Job:  [id number]

## 2011-06-17 ENCOUNTER — Other Ambulatory Visit (INDEPENDENT_AMBULATORY_CARE_PROVIDER_SITE_OTHER): Payer: Medicaid Other | Admitting: Vascular Surgery

## 2011-06-17 DIAGNOSIS — I6529 Occlusion and stenosis of unspecified carotid artery: Secondary | ICD-10-CM

## 2011-06-24 ENCOUNTER — Encounter: Payer: Self-pay | Admitting: Vascular Surgery

## 2011-06-24 NOTE — Procedures (Unsigned)
CAROTID DUPLEX EXAM  INDICATION:  Follow up carotid stenosis.  HISTORY: Diabetes:  Yes. Cardiac:  No. Hypertension:  Yes. Smoking:  Currently. Previous Surgery:  No. CV History:  History of CVA with residual left-sided weakness, no new symptoms today. Amaurosis Fugax No, Paresthesias Yes, Hemiparesis Yes.                                      RIGHT             LEFT Brachial systolic pressure:         128               158 Brachial Doppler waveforms:         WNL               WNL Vertebral direction of flow:        Not evaluated     Antegrade DUPLEX VELOCITIES (cm/sec) CCA peak systolic                                     169 ECA peak systolic                                     207 ICA peak systolic                                     112 ICA end diastolic                                     41 PLAQUE MORPHOLOGY:                                    Heterogenous PLAQUE AMOUNT:                                        Moderate PLAQUE LOCATION:                                      CCA/ICA/ECA  IMPRESSION: 1. Right carotid artery system not evaluated on today's examination     with known history of right internal carotid artery occlusion. 2. Left common carotid artery disease present. 3. Left external carotid artery stenosis present. 4. Left internal carotid artery stenosis in the 40% to 59% range. 5. Essentially unchanged since the previous study on 06/18/2010.  ___________________________________________ Di Kindle. Edilia Bo, M.D.  SH/MEDQ  D:  06/17/2011  T:  06/17/2011  Job:  [id number]

## 2011-07-02 ENCOUNTER — Other Ambulatory Visit: Payer: Self-pay | Admitting: Vascular Surgery

## 2011-07-02 DIAGNOSIS — I6529 Occlusion and stenosis of unspecified carotid artery: Secondary | ICD-10-CM

## 2011-07-08 ENCOUNTER — Encounter: Payer: Self-pay | Admitting: Cardiology

## 2011-07-24 LAB — BASIC METABOLIC PANEL
BUN: 14
Chloride: 107
Creatinine, Ser: 0.95
GFR calc non Af Amer: >60
Potassium: 4.1
Sodium: 138

## 2011-07-27 LAB — BASIC METABOLIC PANEL
Calcium: 8.5
Potassium: 3.7

## 2011-07-30 ENCOUNTER — Ambulatory Visit (HOSPITAL_COMMUNITY)
Admission: RE | Admit: 2011-07-30 | Discharge: 2011-07-30 | Disposition: A | Discharge status: Home / Self Care | Payer: Medicaid Other | Source: Ambulatory Visit | Attending: Urology | Admitting: Urology

## 2011-07-30 ENCOUNTER — Other Ambulatory Visit (HOSPITAL_COMMUNITY): Payer: Self-pay | Admitting: Urology

## 2011-07-30 DIAGNOSIS — N200 Calculus of kidney: Secondary | ICD-10-CM

## 2011-07-30 DIAGNOSIS — R109 Unspecified abdominal pain: Secondary | ICD-10-CM | POA: Insufficient documentation

## 2011-10-23 ENCOUNTER — Encounter: Payer: Self-pay | Admitting: Cardiology

## 2011-12-29 ENCOUNTER — Other Ambulatory Visit: Payer: Medicaid Other

## 2012-03-11 ENCOUNTER — Emergency Department (HOSPITAL_COMMUNITY): Payer: Medicaid Other

## 2012-03-11 ENCOUNTER — Encounter (HOSPITAL_COMMUNITY): Payer: Self-pay | Admitting: *Deleted

## 2012-03-11 ENCOUNTER — Emergency Department (HOSPITAL_COMMUNITY)
Admission: EM | Admit: 2012-03-11 | Discharge: 2012-03-11 | Disposition: A | Discharge status: Home / Self Care | Payer: Medicaid Other | Attending: Emergency Medicine | Admitting: Emergency Medicine

## 2012-03-11 DIAGNOSIS — M25519 Pain in unspecified shoulder: Secondary | ICD-10-CM | POA: Insufficient documentation

## 2012-03-11 DIAGNOSIS — F17200 Nicotine dependence, unspecified, uncomplicated: Secondary | ICD-10-CM | POA: Insufficient documentation

## 2012-03-11 DIAGNOSIS — I10 Essential (primary) hypertension: Secondary | ICD-10-CM | POA: Insufficient documentation

## 2012-03-11 DIAGNOSIS — S0100XA Unspecified open wound of scalp, initial encounter: Secondary | ICD-10-CM | POA: Insufficient documentation

## 2012-03-11 DIAGNOSIS — S40019A Contusion of unspecified shoulder, initial encounter: Secondary | ICD-10-CM | POA: Insufficient documentation

## 2012-03-11 DIAGNOSIS — E119 Type 2 diabetes mellitus without complications: Secondary | ICD-10-CM | POA: Insufficient documentation

## 2012-03-11 DIAGNOSIS — S40012A Contusion of left shoulder, initial encounter: Secondary | ICD-10-CM

## 2012-03-11 DIAGNOSIS — S0101XA Laceration without foreign body of scalp, initial encounter: Secondary | ICD-10-CM

## 2012-03-11 HISTORY — DX: Cerebral infarction, unspecified: I63.9

## 2012-03-11 HISTORY — DX: Essential (primary) hypertension: I10

## 2012-03-11 MED ORDER — LIDOCAINE HCL (PF) 2 % IJ SOLN
10.0000 mL | Freq: Once | INTRAMUSCULAR | Status: AC
  Administered 2012-03-11: 10 mL

## 2012-03-11 MED ORDER — LIDOCAINE HCL (PF) 2 % IJ SOLN
INTRAMUSCULAR | Status: AC
  Administered 2012-03-11: 10 mL
  Filled 2012-03-11: qty 10

## 2012-03-11 MED ORDER — HYDROCODONE-ACETAMINOPHEN 5-325 MG PO TABS
1.0000 | ORAL_TABLET | Freq: Once | ORAL | Status: AC
  Administered 2012-03-11: 1 via ORAL
  Filled 2012-03-11: qty 1

## 2012-03-11 MED ORDER — TETANUS-DIPHTH-ACELL PERTUSSIS 5-2.5-18.5 LF-MCG/0.5 IM SUSP
0.5000 mL | Freq: Once | INTRAMUSCULAR | Status: AC
  Administered 2012-03-11: 0.5 mL via INTRAMUSCULAR
  Filled 2012-03-11: qty 0.5

## 2012-03-11 NOTE — ED Notes (Signed)
Pt in xray

## 2012-03-11 NOTE — Discharge Instructions (Signed)
Staples will need to be removed in 7-10 days. Continue to take your regular medicines.

## 2012-03-11 NOTE — ED Notes (Signed)
Pain med given for body aches
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
## 2012-03-11 NOTE — ED Notes (Signed)
Tet given to R deltoid, not Left

## 2012-03-12 HISTORY — PX: FRACTURE SURGERY: SHX138

## 2012-05-29 ENCOUNTER — Encounter (HOSPITAL_COMMUNITY): Payer: Self-pay | Admitting: *Deleted

## 2012-05-29 ENCOUNTER — Emergency Department (HOSPITAL_COMMUNITY): Payer: Medicaid Other

## 2012-05-29 ENCOUNTER — Emergency Department (HOSPITAL_COMMUNITY)
Admission: EM | Admit: 2012-05-29 | Discharge: 2012-05-29 | Disposition: A | Discharge status: Home / Self Care | Payer: Medicaid Other | Attending: Emergency Medicine | Admitting: Emergency Medicine

## 2012-05-29 DIAGNOSIS — M545 Low back pain, unspecified: Secondary | ICD-10-CM | POA: Insufficient documentation

## 2012-05-29 DIAGNOSIS — E119 Type 2 diabetes mellitus without complications: Secondary | ICD-10-CM | POA: Insufficient documentation

## 2012-05-29 DIAGNOSIS — R209 Unspecified disturbances of skin sensation: Secondary | ICD-10-CM | POA: Insufficient documentation

## 2012-05-29 DIAGNOSIS — I10 Essential (primary) hypertension: Secondary | ICD-10-CM | POA: Insufficient documentation

## 2012-05-29 DIAGNOSIS — Z79899 Other long term (current) drug therapy: Secondary | ICD-10-CM | POA: Insufficient documentation

## 2012-05-29 DIAGNOSIS — R109 Unspecified abdominal pain: Secondary | ICD-10-CM | POA: Insufficient documentation

## 2012-05-29 DIAGNOSIS — F17200 Nicotine dependence, unspecified, uncomplicated: Secondary | ICD-10-CM | POA: Insufficient documentation

## 2012-05-29 DIAGNOSIS — Z8673 Personal history of transient ischemic attack (TIA), and cerebral infarction without residual deficits: Secondary | ICD-10-CM | POA: Insufficient documentation

## 2012-05-29 LAB — COMPREHENSIVE METABOLIC PANEL
AST: 13 U/L (ref 0–37)
Albumin: 3.8 g/dL (ref 3.5–5.2)
BUN: 7 mg/dL (ref 6–23)
CO2: 26 mEq/L (ref 19–32)
Calcium: 9.6 mg/dL (ref 8.4–10.5)
Creatinine, Ser: 0.7 mg/dL (ref 0.50–1.35)
GFR calc non Af Amer: >90 mL/min (ref >90)

## 2012-05-29 LAB — CBC WITH DIFFERENTIAL/PLATELET
Basophils Absolute: 0 10*3/uL (ref 0.0–0.1)
Basophils Relative: 0 % (ref 0–1)
Eosinophils Relative: 2 % (ref 0–5)
HCT: 43.5 % (ref 39.0–52.0)
MCHC: 32.9 g/dL (ref 30.0–36.0)
MCV: 96.5 fL (ref 78.0–100.0)
Monocytes Absolute: 0.9 10*3/uL (ref 0.1–1.0)
Monocytes Relative: 6 % (ref 3–12)
RDW: 14.8 % (ref 11.5–15.5)

## 2012-05-29 LAB — URINALYSIS, ROUTINE W REFLEX MICROSCOPIC
Glucose, UA: NEGATIVE mg/dL
Leukocytes, UA: NEGATIVE
Nitrite: NEGATIVE
Protein, ur: NEGATIVE mg/dL

## 2012-05-29 LAB — LIPASE, BLOOD: Lipase: 18 U/L (ref 11–59)

## 2012-05-29 MED ORDER — HYDROCODONE-ACETAMINOPHEN 10-325 MG PO TABS: 1.0000 | ORAL_TABLET | Freq: Four times a day (QID) | ORAL | Status: AC | PRN

## 2012-05-29 MED ORDER — IOHEXOL 300 MG/ML  SOLN
100.0000 mL | Freq: Once | INTRAMUSCULAR | Status: AC | PRN
  Administered 2012-05-29: 100 mL via INTRAVENOUS

## 2012-05-29 MED ORDER — SODIUM CHLORIDE 0.9 % IV SOLN
INTRAVENOUS | Status: DC
  Administered 2012-05-29: 20:00:00 via INTRAVENOUS

## 2012-05-29 NOTE — ED Notes (Signed)
Pt reporting falling about 1 week ago and hurting back.  Also reports that about that time he began having abdominal pain.  Reports sharp pain in abdomen, "when I try to sit up".  Denies any nausea or vomiting at present time.  No distress noted.

## 2012-05-29 NOTE — ED Notes (Signed)
Pt c/o lower abdominal pain and lower back pain since falling 1 week ago. Denies nausea, vomiting or diarrhea.

## 2012-05-29 NOTE — ED Notes (Signed)
Discharge instructions given and reviewed with patient.  Prescription given for Hydrocodone; effects and use explained.  Patient verbalized understanding of sedating effects; states he currently uses this medication for chronic pain.  Patient discharge home in good condition via wheelchair.  Family member accompanied discharge to drive home.

## 2012-05-29 NOTE — ED Provider Notes (Signed)
History  This chart was scribed for [name] Cooper III, MD by Ladona Ridgel Day. This patient was seen in room APA11/APA11 and the patient's care was started at 1716.   CSN: [id number]  Arrival date & time 05/29/12  1716   First MD Initiated Contact with Patient 05/29/12 1844      Chief Complaint  Patient presents with  . Abdominal Pain   Patient is a 61 y.o. male presenting with abdominal pain. The history is provided by the patient. No language interpreter was used.  Abdominal Pain The primary symptoms of the illness include abdominal pain. The primary symptoms of the illness do not include fever, shortness of breath, nausea, vomiting or diarrhea. The current episode started more than 2 days ago. The onset of the illness was gradual. The problem has been gradually worsening.  The patient has not had a change in bowel habit. Risk factors for an acute abdominal problem include being elderly. Additional symptoms associated with the illness include back pain. Symptoms associated with the illness do not include chills.   [name] is a 61 y.o. male who presents to the Emergency Department complaining of constant lower back and lower abdominal pain since he fell one week ago. He fell on a hardwood floor and also has a prior right ankle injury that was operated on 2 months ago and now has a walking boot on. He experiences intermittent right leg numbness and has had two previous CVAs. He denies any emesis, abnormal BMs, or other injuries/illnesses at this time. He is a diabetic who uses metformin injections. Has had no abdominal operations and is allergic to morphine and flomax.   Past Medical History  Diagnosis Date  . Diabetes mellitus   . Hypertension   . Stroke     Past Surgical History  Procedure Date  . Fracture surgery     History reviewed. No pertinent family history.  History  Substance Use Topics  . Smoking status: Current Everyday Smoker -- 1.0 packs/day    Types: Cigarettes    . Smokeless tobacco: Not on file  . Alcohol Use: No      Review of Systems  Constitutional: Negative for fever and chills.  HENT: Negative for congestion.   Respiratory: Negative for chest tightness and shortness of breath.   Cardiovascular: Negative for chest pain.  Gastrointestinal: Positive for abdominal pain. Negative for nausea, vomiting and diarrhea.  Genitourinary: Negative for difficulty urinating.  Musculoskeletal: Positive for back pain.  Skin: Negative for color change.  Neurological: Negative for weakness.  All other systems reviewed and are negative.    Allergies  Other; Flomax; and Morphine and related  Home Medications   Current Outpatient Rx  Name Route Sig Dispense Refill  . ALPRAZOLAM ER 1 MG PO TB24 Oral Take 1 mg by mouth 4 (four) times daily as needed. For anxiety    . DICLOFENAC SODIUM 1 % TD GEL Topical Apply 1 application topically 2 (two) times daily. Apply to left shoulder    . DILTIAZEM HCL ER COATED BEADS 120 MG PO CP24 Oral Take 120 mg by mouth daily.    Marland Kitchen ESOMEPRAZOLE MAGNESIUM 40 MG PO CPDR Oral Take 40 mg by mouth daily before breakfast.    . GABAPENTIN 100 MG PO CAPS Oral Take 100 mg by mouth 3 (three) times daily.    Marland Kitchen HYDROCODONE-ACETAMINOPHEN 10-500 MG PO TABS Oral Take 1 tablet by mouth every 6 (six) hours as needed. pain    . LORATADINE 10 MG  PO TABS Oral Take 10 mg by mouth daily.    Marland Kitchen METFORMIN HCL 1000 MG PO TABS Oral Take 1,000 mg by mouth 2 (two) times daily with a meal.    . PAROXETINE HCL 10 MG PO TABS Oral Take 10 mg by mouth every morning.    Marland Kitchen ROPINIROLE HCL 0.25 MG PO TABS Oral Take 0.25 mg by mouth at bedtime.    Marland Kitchen SIMVASTATIN 40 MG PO TABS Oral Take 40 mg by mouth every evening.      Triage Vitals: BP 100/57  Pulse 63  Temp 97.9 F (36.6 C) (Oral)  Resp 18  Ht 5\' 9"  (1.753 m)  Wt 276 lb (125.193 kg)  BMI 40.76 kg/m2  SpO2 98%  Physical Exam  Nursing note and vitals reviewed. Constitutional: He is oriented to  person, place, and time. He appears well-developed and well-nourished. No distress.  HENT:  Head: Normocephalic and atraumatic.  Eyes: EOM are normal.  Neck: Neck supple. No tracheal deviation present.  Cardiovascular: Normal rate.   Pulmonary/Chest: Effort normal. No respiratory distress.  Musculoskeletal: Normal range of motion.  Neurological: He is alert and oriented to person, place, and time.  Skin: Skin is warm and dry.  Psychiatric: He has a normal mood and affect. His behavior is normal.    ED Course  Procedures (including critical care time) DIAGNOSTIC STUDIES: Oxygen Saturation is 98% on room air, normal by my interpretation.    COORDINATION OF CARE: At 720 PM Discussed treatment plan with patient which includes IV fluids, abdominal CT, blood work, and UA. Patient agrees.   1040 PM Discussed normal lab/urine tests as well as normal abdominal X-ray. Discusses that he is cleared to go home and patient agrees/understands.   Results for orders placed during the hospital encounter of 05/29/12  CBC WITH DIFFERENTIAL      Component Value Range   WBC 13.3 (*) 4.0 - 10.5 K/uL   RBC 4.51  4.22 - 5.81 MIL/uL   Hemoglobin 14.3  13.0 - 17.0 g/dL   HCT 11.9  14.7 - 82.9 %   MCV 96.5  78.0 - 100.0 fL   MCH 31.7  26.0 - 34.0 pg   MCHC 32.9  30.0 - 36.0 g/dL   RDW 56.2  13.0 - 86.5 %   Platelets 216  150 - 400 K/uL   Neutrophils Relative 55  43 - 77 %   Neutro Abs 7.3  1.7 - 7.7 K/uL   Lymphocytes Relative 37  12 - 46 %   Lymphs Abs 5.0 (*) 0.7 - 4.0 K/uL   Monocytes Relative 6  3 - 12 %   Monocytes Absolute 0.9  0.1 - 1.0 K/uL   Eosinophils Relative 2  0 - 5 %   Eosinophils Absolute 0.2  0.0 - 0.7 K/uL   Basophils Relative 0  0 - 1 %   Basophils Absolute 0.0  0.0 - 0.1 K/uL  COMPREHENSIVE METABOLIC PANEL      Component Value Range   Sodium 139  135 - 145 mEq/L   Potassium 4.1  3.5 - 5.1 mEq/L   Chloride 102  96 - 112 mEq/L   CO2 26  19 - 32 mEq/L   Glucose, Bld 94  70 -  99 mg/dL   BUN 7  6 - 23 mg/dL   Creatinine, Ser 7.84  0.50 - 1.35 mg/dL   Calcium 9.6  8.4 - 69.6 mg/dL   Total Protein 7.8  6.0 - 8.3 g/dL  Albumin 3.8  3.5 - 5.2 g/dL   AST 13  0 - 37 U/L   ALT 10  0 - 53 U/L   Alkaline Phosphatase 89  39 - 117 U/L   Total Bilirubin 0.3  0.3 - 1.2 mg/dL   GFR calc non Af Amer >90  >90 mL/min   GFR calc Af Amer >90  >90 mL/min  LIPASE, BLOOD      Component Value Range   Lipase 18  11 - 59 U/L  URINALYSIS, ROUTINE W REFLEX MICROSCOPIC      Component Value Range   Color, Urine YELLOW  YELLOW   APPearance CLEAR  CLEAR   Specific Gravity, Urine <1.005 (*) 1.005 - 1.030   pH 6.0  5.0 - 8.0   Glucose, UA NEGATIVE  NEGATIVE mg/dL   Hgb urine dipstick NEGATIVE  NEGATIVE   Bilirubin Urine NEGATIVE  NEGATIVE   Ketones, ur NEGATIVE  NEGATIVE mg/dL   Protein, ur NEGATIVE  NEGATIVE mg/dL   Urobilinogen, UA 0.2  0.0 - 1.0 mg/dL   Nitrite NEGATIVE  NEGATIVE   Leukocytes, UA NEGATIVE  NEGATIVE   Ct Abdomen Pelvis W Contrast  05/29/2012  *RADIOLOGY REPORT*  Clinical Data: Abdominal pain  CT ABDOMEN AND PELVIS WITH CONTRAST  Technique:  Multidetector CT imaging of the abdomen and pelvis was performed following the standard protocol during bolus administration of intravenous contrast.  Contrast: OMNIPAQUE IOHEXOL 300 MG/ML  SOLN  Comparison: CT abdomen 08/31/2011  Findings: Lung bases are clear aside from mild scarring in the lingula.  Liver gallbladder and bile ducts are normal.  Pancreas and spleen are normal.  Dilated extrarenal pelvis on the left is unchanged. No hydronephrosis.  Cortical renal cysts bilaterally are unchanged from the prior study.  No kidney stone.  Negative for bowel obstruction or bowel thickening.  Appendix is normal.  Negative for bowel thickening.  No free fluid or adenopathy.  IMPRESSION: No acute abnormality and no change from the prior study.  Original Report Authenticated By: Camelia Phenes, M.D.    Lab workup is essentially  negative.  He has chronic pain, Rx for hydrocodone-acetaminophen 10/325.  F/U with Dr. Felecia Shelling.    1. Abdominal  pain, other specified site   2. Low back pain      I personally performed the services described in this documentation, which was scribed in my presence. The recorded information has been reviewed and considered.  Osvaldo Human, MD      [name] Cooper III, MD 05/29/12 2252

## 2012-06-01 ENCOUNTER — Inpatient Hospital Stay (HOSPITAL_COMMUNITY)
Admission: EM | Admit: 2012-06-01 | Discharge: 2012-06-07 | DRG: 313 | Disposition: A | Discharge status: Skilled Nursing Facility | Payer: Medicaid Other | Attending: Internal Medicine | Admitting: Internal Medicine

## 2012-06-01 ENCOUNTER — Emergency Department (HOSPITAL_COMMUNITY): Payer: Medicaid Other

## 2012-06-01 ENCOUNTER — Encounter (HOSPITAL_COMMUNITY): Payer: Self-pay | Admitting: *Deleted

## 2012-06-01 DIAGNOSIS — I69928 Other speech and language deficits following unspecified cerebrovascular disease: Secondary | ICD-10-CM

## 2012-06-01 DIAGNOSIS — Z7902 Long term (current) use of antithrombotics/antiplatelets: Secondary | ICD-10-CM

## 2012-06-01 DIAGNOSIS — Z888 Allergy status to other drugs, medicaments and biological substances status: Secondary | ICD-10-CM

## 2012-06-01 DIAGNOSIS — G8929 Other chronic pain: Secondary | ICD-10-CM | POA: Diagnosis present

## 2012-06-01 DIAGNOSIS — I6529 Occlusion and stenosis of unspecified carotid artery: Secondary | ICD-10-CM | POA: Diagnosis present

## 2012-06-01 DIAGNOSIS — R0789 Other chest pain: Principal | ICD-10-CM

## 2012-06-01 DIAGNOSIS — R9431 Abnormal electrocardiogram [ECG] [EKG]: Secondary | ICD-10-CM

## 2012-06-01 DIAGNOSIS — I658 Occlusion and stenosis of other precerebral arteries: Secondary | ICD-10-CM | POA: Diagnosis present

## 2012-06-01 DIAGNOSIS — R404 Transient alteration of awareness: Secondary | ICD-10-CM | POA: Diagnosis not present

## 2012-06-01 DIAGNOSIS — Z885 Allergy status to narcotic agent status: Secondary | ICD-10-CM

## 2012-06-01 DIAGNOSIS — E785 Hyperlipidemia, unspecified: Secondary | ICD-10-CM | POA: Diagnosis present

## 2012-06-01 DIAGNOSIS — E1159 Type 2 diabetes mellitus with other circulatory complications: Secondary | ICD-10-CM

## 2012-06-01 DIAGNOSIS — E1149 Type 2 diabetes mellitus with other diabetic neurological complication: Secondary | ICD-10-CM | POA: Diagnosis present

## 2012-06-01 DIAGNOSIS — Z87442 Personal history of urinary calculi: Secondary | ICD-10-CM

## 2012-06-01 DIAGNOSIS — I639 Cerebral infarction, unspecified: Secondary | ICD-10-CM

## 2012-06-01 DIAGNOSIS — Z9181 History of falling: Secondary | ICD-10-CM

## 2012-06-01 DIAGNOSIS — I959 Hypotension, unspecified: Secondary | ICD-10-CM

## 2012-06-01 DIAGNOSIS — M62838 Other muscle spasm: Secondary | ICD-10-CM | POA: Diagnosis present

## 2012-06-01 DIAGNOSIS — W19XXXA Unspecified fall, initial encounter: Secondary | ICD-10-CM

## 2012-06-01 DIAGNOSIS — R443 Hallucinations, unspecified: Secondary | ICD-10-CM | POA: Diagnosis not present

## 2012-06-01 DIAGNOSIS — M549 Dorsalgia, unspecified: Secondary | ICD-10-CM | POA: Diagnosis present

## 2012-06-01 DIAGNOSIS — I10 Essential (primary) hypertension: Secondary | ICD-10-CM

## 2012-06-01 DIAGNOSIS — Z6841 Body Mass Index (BMI) 40.0 and over, adult: Secondary | ICD-10-CM

## 2012-06-01 DIAGNOSIS — R269 Unspecified abnormalities of gait and mobility: Secondary | ICD-10-CM | POA: Diagnosis present

## 2012-06-01 DIAGNOSIS — I69998 Other sequelae following unspecified cerebrovascular disease: Secondary | ICD-10-CM

## 2012-06-01 DIAGNOSIS — I739 Peripheral vascular disease, unspecified: Secondary | ICD-10-CM | POA: Diagnosis present

## 2012-06-01 DIAGNOSIS — F17200 Nicotine dependence, unspecified, uncomplicated: Secondary | ICD-10-CM | POA: Diagnosis present

## 2012-06-01 DIAGNOSIS — E1142 Type 2 diabetes mellitus with diabetic polyneuropathy: Secondary | ICD-10-CM | POA: Diagnosis present

## 2012-06-01 DIAGNOSIS — E669 Obesity, unspecified: Secondary | ICD-10-CM | POA: Diagnosis present

## 2012-06-01 HISTORY — DX: Dorsalgia, unspecified: M54.9

## 2012-06-01 HISTORY — DX: Polyneuropathy, unspecified: G62.9

## 2012-06-01 HISTORY — DX: Calculus of kidney: N20.0

## 2012-06-01 HISTORY — DX: Other chronic pain: G89.29

## 2012-06-01 HISTORY — DX: Hyperlipidemia, unspecified: E78.5

## 2012-06-01 LAB — CBC WITH DIFFERENTIAL/PLATELET
Basophils Absolute: 0 10*3/uL (ref 0.0–0.1)
Basophils Relative: 0 % (ref 0–1)
Eosinophils Absolute: 0.2 10*3/uL (ref 0.0–0.7)
Hemoglobin: 12.7 g/dL — ABNORMAL LOW (ref 13.0–17.0)
MCH: 31.4 pg (ref 26.0–34.0)
MCHC: 33.4 g/dL (ref 30.0–36.0)
Monocytes Relative: 7 % (ref 3–12)
Neutro Abs: 6.2 10*3/uL (ref 1.7–7.7)
Neutrophils Relative %: 58 % (ref 43–77)
Platelets: 202 10*3/uL (ref 150–400)
RDW: 14.5 % (ref 11.5–15.5)

## 2012-06-01 LAB — URINALYSIS, ROUTINE W REFLEX MICROSCOPIC
Bilirubin Urine: NEGATIVE
Leukocytes, UA: NEGATIVE
Nitrite: NEGATIVE
Specific Gravity, Urine: 1.02 (ref 1.005–1.030)
Urobilinogen, UA: 0.2 mg/dL (ref 0.0–1.0)
pH: 5.5 (ref 5.0–8.0)

## 2012-06-01 LAB — BASIC METABOLIC PANEL
Chloride: 104 mEq/L (ref 96–112)
GFR calc Af Amer: >90 mL/min (ref >90)
GFR calc non Af Amer: >90 mL/min (ref >90)
Potassium: 4 mEq/L (ref 3.5–5.1)
Sodium: 138 mEq/L (ref 135–145)

## 2012-06-01 LAB — CARDIAC PANEL(CRET KIN+CKTOT+MB+TROPI)
Relative Index: INVALID (ref 0.0–2.5)
Total CK: 56 U/L (ref 7–232)

## 2012-06-01 LAB — TROPONIN I: Troponin I: <0.3 ng/mL (ref <0.30)

## 2012-06-01 MED ORDER — ROPINIROLE HCL 0.25 MG PO TABS
0.2500 mg | ORAL_TABLET | Freq: Every day | ORAL | Status: DC
  Administered 2012-06-01 – 2012-06-06 (×6): 0.25 mg via ORAL
  Filled 2012-06-01 (×7): qty 1

## 2012-06-01 MED ORDER — ENOXAPARIN SODIUM 40 MG/0.4ML ~~LOC~~ SOLN: 40.0000 mg | SUBCUTANEOUS | Status: DC

## 2012-06-01 MED ORDER — PANTOPRAZOLE SODIUM 40 MG PO TBEC
40.0000 mg | DELAYED_RELEASE_TABLET | Freq: Every day | ORAL | Status: DC
  Administered 2012-06-02 – 2012-06-07 (×6): 40 mg via ORAL
  Filled 2012-06-01 (×6): qty 1

## 2012-06-01 MED ORDER — DOCUSATE SODIUM 100 MG PO CAPS
200.0000 mg | ORAL_CAPSULE | Freq: Every day | ORAL | Status: DC
  Administered 2012-06-02 – 2012-06-07 (×6): 200 mg via ORAL
  Filled 2012-06-01: qty 1
  Filled 2012-06-01: qty 2
  Filled 2012-06-01: qty 1
  Filled 2012-06-01 (×4): qty 2

## 2012-06-01 MED ORDER — HYDROCODONE-ACETAMINOPHEN 10-325 MG PO TABS
1.0000 | ORAL_TABLET | Freq: Four times a day (QID) | ORAL | Status: DC | PRN
  Administered 2012-06-01 – 2012-06-04 (×9): 1 via ORAL
  Filled 2012-06-01 (×9): qty 1

## 2012-06-01 MED ORDER — VITAMIN D (ERGOCALCIFEROL) 1.25 MG (50000 UNIT) PO CAPS
50000.0000 [IU] | ORAL_CAPSULE | ORAL | Status: DC
  Administered 2012-06-02: 50000 [IU] via ORAL
  Filled 2012-06-01: qty 1

## 2012-06-01 MED ORDER — ENOXAPARIN SODIUM 60 MG/0.6ML ~~LOC~~ SOLN
60.0000 mg | SUBCUTANEOUS | Status: DC
  Administered 2012-06-01 – 2012-06-06 (×6): 60 mg via SUBCUTANEOUS
  Filled 2012-06-01 (×6): qty 0.6

## 2012-06-01 MED ORDER — FINASTERIDE 5 MG PO TABS
5.0000 mg | ORAL_TABLET | Freq: Every day | ORAL | Status: DC
  Administered 2012-06-02 – 2012-06-07 (×6): 5 mg via ORAL
  Filled 2012-06-01 (×7): qty 1

## 2012-06-01 MED ORDER — SODIUM CHLORIDE 0.9 % IV SOLN: INTRAVENOUS | Status: AC

## 2012-06-01 MED ORDER — SODIUM CHLORIDE 0.9 % IV SOLN
INTRAVENOUS | Status: DC
  Administered 2012-06-01 – 2012-06-03 (×4): via INTRAVENOUS

## 2012-06-01 MED ORDER — ONDANSETRON HCL 4 MG/2ML IJ SOLN: 4.0000 mg | Freq: Three times a day (TID) | INTRAMUSCULAR | Status: AC | PRN

## 2012-06-01 MED ORDER — GABAPENTIN 100 MG PO CAPS
100.0000 mg | ORAL_CAPSULE | Freq: Three times a day (TID) | ORAL | Status: DC
Start: 2012-06-01 — End: 2012-06-07
  Administered 2012-06-01 – 2012-06-07 (×17): 100 mg via ORAL
  Filled 2012-06-01 (×17): qty 1

## 2012-06-01 MED ORDER — METFORMIN HCL 500 MG PO TABS
2000.0000 mg | ORAL_TABLET | Freq: Two times a day (BID) | ORAL | Status: DC
  Administered 2012-06-02 – 2012-06-07 (×11): 2000 mg via ORAL
  Filled 2012-06-01 (×11): qty 4

## 2012-06-01 MED ORDER — SODIUM CHLORIDE 0.9 % IV BOLUS (SEPSIS)
250.0000 mL | Freq: Once | INTRAVENOUS | Status: AC
  Administered 2012-06-01: 250 mL via INTRAVENOUS

## 2012-06-01 MED ORDER — SIMVASTATIN 20 MG PO TABS: 40.0000 mg | ORAL_TABLET | Freq: Every evening | ORAL | Status: DC

## 2012-06-01 MED ORDER — PAROXETINE HCL 20 MG PO TABS
20.0000 mg | ORAL_TABLET | Freq: Every day | ORAL | Status: DC
  Administered 2012-06-02 – 2012-06-07 (×6): 20 mg via ORAL
  Filled 2012-06-01 (×6): qty 1

## 2012-06-01 MED ORDER — LORATADINE 10 MG PO TABS
10.0000 mg | ORAL_TABLET | Freq: Every day | ORAL | Status: DC
  Administered 2012-06-02 – 2012-06-07 (×6): 10 mg via ORAL
  Filled 2012-06-01 (×6): qty 1

## 2012-06-01 MED ORDER — DILTIAZEM HCL ER COATED BEADS 120 MG PO CP24
120.0000 mg | ORAL_CAPSULE | Freq: Every day | ORAL | Status: DC
  Administered 2012-06-02 – 2012-06-07 (×6): 120 mg via ORAL
  Filled 2012-06-01 (×6): qty 1

## 2012-06-01 NOTE — ED Notes (Signed)
Pt arrived via ems from home dt c/o left sided rib pain. Pt states he got up this am and fell to the floor on his left side. Pt states his legs gave out from under him.

## 2012-06-01 NOTE — ED Notes (Signed)
Patient states he is ready to go home at this time. RN made aware.

## 2012-06-01 NOTE — ED Notes (Signed)
Lab at bedside

## 2012-06-01 NOTE — ED Notes (Signed)
Patient is getting upset about the wait to be admitted. Patient was told as soon as report was called he would be admitted.

## 2012-06-01 NOTE — ED Provider Notes (Signed)
History     CSN: [id number]  Arrival date & time 06/01/12  1333   First MD Initiated Contact with Patient 06/01/12 1343      Chief Complaint  Patient presents with  . Chest Pain    HPI Pt was seen at 1345.  Per pt, c/o gradual onset and worsening of constant generalized weakness and fatigue since yesterday.  Pt states today he got up from bed "and next thing I knew I was on the floor." States he fell onto the floor on his left side and is c/o left sided CP.  Pt endorses he "falls a lot" since his previous CVA.  Pt does not believe he had a syncopal episode.  Denies prodromal symptoms before fall, no palpitations, no SOB/cough, no abd pain, no N/V/D, no new focal motor weakness or tingling/numbness in extremities.       Past Medical History  Diagnosis Date  . Diabetes mellitus   . Hypertension   . Stroke   . Hyperlipidemia   . Internal carotid artery occlusion     right  . Left carotid artery stenosis   . Peripheral neuropathy   . Chronic back pain   . Kidney stones     Past Surgical History  Procedure Date  . Fracture surgery     History  Substance Use Topics  . Smoking status: Current Everyday Smoker -- 1.0 packs/day    Types: Cigarettes  . Smokeless tobacco: Not on file  . Alcohol Use: No    Review of Systems ROS: Statement: All systems negative except as marked or noted in the HPI; Constitutional: Negative for fever and chills. ; ; Eyes: Negative for eye pain, redness and discharge. ; ; ENMT: Negative for ear pain, hoarseness, nasal congestion, sinus pressure and sore throat. ; ; Cardiovascular: +CP. Negative for palpitations, diaphoresis, dyspnea and peripheral edema. ; ; Respiratory: Negative for cough, wheezing and stridor. ; ; Gastrointestinal: Negative for nausea, vomiting, diarrhea, abdominal pain, blood in stool, hematemesis, jaundice and rectal bleeding. . ; ; Genitourinary: Negative for dysuria, flank pain and hematuria. ; ; Musculoskeletal: Negative for back  pain and neck pain. Negative for swelling and trauma.; ; Skin: Negative for pruritus, rash, abrasions, blisters, bruising and skin lesion.; ; Neuro: +generalized weakness. Negative for headache, lightheadedness and neck stiffness. Negative for altered level of consciousness , altered mental status, extremity weakness, paresthesias, involuntary movement, seizure and syncope.     Allergies  Other; Flomax; and Morphine and related  Home Medications   Current Outpatient Rx  Name Route Sig Dispense Refill  . ALPRAZOLAM ER 1 MG PO TB24 Oral Take 1 mg by mouth 5 (five) times daily. For anxiety    . CLOPIDOGREL BISULFATE 75 MG PO TABS Oral Take 75 mg by mouth daily.    Marland Kitchen DICLOFENAC SODIUM 1 % TD GEL Topical Apply 1 application topically 2 (two) times daily. Apply to left shoulder    . DILTIAZEM HCL ER COATED BEADS 120 MG PO CP24 Oral Take 120 mg by mouth daily.    Marland Kitchen DOCUSATE SODIUM 100 MG PO CAPS Oral Take 200 mg by mouth daily.    Marland Kitchen ESOMEPRAZOLE MAGNESIUM 40 MG PO CPDR Oral Take 40 mg by mouth daily before breakfast.    . FINASTERIDE 5 MG PO TABS Oral Take 5 mg by mouth daily.    . FUROSEMIDE 40 MG PO TABS Oral Take 40 mg by mouth daily.    Marland Kitchen GABAPENTIN 100 MG PO CAPS Oral Take 100  mg by mouth 3 (three) times daily.    Marland Kitchen HYDROCODONE-ACETAMINOPHEN 10-325 MG PO TABS Oral Take 1 tablet by mouth every 6 (six) hours as needed for pain. 30 tablet 0  . LORATADINE 10 MG PO TABS Oral Take 10 mg by mouth daily.    Marland Kitchen METFORMIN HCL 1000 MG PO TABS Oral Take 2,000 mg by mouth 2 (two) times daily with a meal.     . PAROXETINE HCL 20 MG PO TABS Oral Take 20 mg by mouth daily.    Marland Kitchen ROPINIROLE HCL 0.25 MG PO TABS Oral Take 0.25 mg by mouth at bedtime.    Marland Kitchen VITAMIN D (ERGOCALCIFEROL) 50000 UNITS PO CAPS Oral Take 50,000 Units by mouth every 7 (seven) days. Provider told patient he did not need to take in the summer    . SIMVASTATIN 40 MG PO TABS Oral Take 40 mg by mouth every evening.      BP 112/64  Pulse 77   Temp 98 F (36.7 C) (Oral)  Resp 19  Ht 5' 8.5" (1.74 m)  Wt 276 lb (125.193 kg)  BMI 41.36 kg/m2  SpO2 95%  Physical Exam 1350: Physical examination:  Nursing notes reviewed; Vital signs and O2 SAT reviewed;  Constitutional: Well developed, Well nourished, disheveled appearing. In no acute distress; Head:  Normocephalic, atraumatic; Eyes: EOMI, PERRL, No scleral icterus; ENMT: Mouth and pharynx normal, Mucous membranes dry; Neck: Supple, Full range of motion, No lymphadenopathy; Cardiovascular: Regular rate and rhythm, No gallop; Respiratory: Breath sounds clear & equal bilaterally, No wheezes.  Speaking full sentences with ease, Normal respiratory effort/excursion; Chest: Nontender, Movement normal; Abdomen: Soft, Nontender, Nondistended, Normal bowel sounds;; Extremities: Pulses normal, No tenderness, RLE in walking boot, LLE in metal brace.; Neuro: AA&Ox3, Major CN grossly intact.  Speech slurred.  No facial droop.  +LUE and LLE paresis per hx previous CVA.; Skin: Color normal, Warm, Dry.   ED Course  Procedures    MDM  MDM Reviewed: nursing note, vitals and previous chart Reviewed previous: ECG Interpretation: ECG, labs, x-ray and CT scan    Date: 06/01/2012  Rate: 73  Rhythm: normal sinus rhythm  QRS Axis: normal  Intervals: normal  ST/T Wave abnormalities: ST depressions inferiorly and ST depressions laterally  Conduction Disutrbances:none  Narrative Interpretation:   Old EKG Reviewed: changes noted; new ST depressions compared to previous EKG dated 05/04/2007.   Results for orders placed during the hospital encounter of 06/01/12  TROPONIN I      Component Value Range   Troponin I <0.30  <0.30 ng/mL  CBC WITH DIFFERENTIAL      Component Value Range   WBC 10.7 (*) 4.0 - 10.5 K/uL   RBC 4.05 (*) 4.22 - 5.81 MIL/uL   Hemoglobin 12.7 (*) 13.0 - 17.0 g/dL   HCT 16.1 (*) 09.6 - 04.5 %   MCV 93.8  78.0 - 100.0 fL   MCH 31.4  26.0 - 34.0 pg   MCHC 33.4  30.0 - 36.0 g/dL     RDW 40.9  81.1 - 91.4 %   Platelets 202  150 - 400 K/uL   Neutrophils Relative 58  43 - 77 %   Neutro Abs 6.2  1.7 - 7.7 K/uL   Lymphocytes Relative 34  12 - 46 %   Lymphs Abs 3.6  0.7 - 4.0 K/uL   Monocytes Relative 7  3 - 12 %   Monocytes Absolute 0.7  0.1 - 1.0 K/uL   Eosinophils Relative 2  0 - 5 %   Eosinophils Absolute 0.2  0.0 - 0.7 K/uL   Basophils Relative 0  0 - 1 %   Basophils Absolute 0.0  0.0 - 0.1 K/uL  BASIC METABOLIC PANEL      Component Value Range   Sodium 138  135 - 145 mEq/L   Potassium 4.0  3.5 - 5.1 mEq/L   Chloride 104  96 - 112 mEq/L   CO2 25  19 - 32 mEq/L   Glucose, Bld 74  70 - 99 mg/dL   BUN 7  6 - 23 mg/dL   Creatinine, Ser 1.61  0.50 - 1.35 mg/dL   Calcium 9.2  8.4 - 09.6 mg/dL   GFR calc non Af Amer >90  >90 mL/min   GFR calc Af Amer >90  >90 mL/min  URINALYSIS, ROUTINE W REFLEX MICROSCOPIC      Component Value Range   Color, Urine YELLOW  YELLOW   APPearance CLEAR  CLEAR   Specific Gravity, Urine 1.020  1.005 - 1.030   pH 5.5  5.0 - 8.0   Glucose, UA NEGATIVE  NEGATIVE mg/dL   Hgb urine dipstick NEGATIVE  NEGATIVE   Bilirubin Urine NEGATIVE  NEGATIVE   Ketones, ur NEGATIVE  NEGATIVE mg/dL   Protein, ur NEGATIVE  NEGATIVE mg/dL   Urobilinogen, UA 0.2  0.0 - 1.0 mg/dL   Nitrite NEGATIVE  NEGATIVE   Leukocytes, UA NEGATIVE  NEGATIVE   Dg Ribs Unilateral W/chest Left 06/01/2012  *RADIOLOGY REPORT*  Clinical Data: Left anterior chest pain status post fall.  History of stroke.  LEFT RIBS AND CHEST - 3+ VIEW  Comparison: Chest radiographs 03/14/2012 and 06/10/2011.  Findings: There is mild cortical irregularity of several of the inferior left ribs anteriorly, most consistent with old healed fractures.  No definite acute displaced rib fracture is demonstrated.  There is no pleural effusion or pneumothorax.  Heart size and mediastinal contours are stable.  The lungs are clear. Left arm contractures noted.  IMPRESSION: Suspected old rib fractures on the  left.  No definite acute rib fracture, pleural effusion or pneumothorax.   Original Report Authenticated By: Gerrianne Scale, M.D.    Ct Head Wo Contrast 06/01/2012  *RADIOLOGY REPORT*  Clinical Data: Status post fall.  History of stroke.  CT HEAD WITHOUT CONTRAST  Technique:  Contiguous axial images were obtained from the base of the skull through the vertex without contrast.  Comparison: Head CT 03/14/2012 and 03/11/2012.  Findings: There is stable chronic encephalomalacia within the right MCA distribution with associated ex vacuo ventricular dilatation. There is no evidence of acute intracranial hemorrhage, mass lesion, brain edema or extra-axial fluid collection.  There is no evidence of acute infarct.  Mild nasal bone deformity appears stable.  There is no evidence of acute calvarial fracture.  Visualized paranasal sinuses and mastoid air cells are clear.  Intracranial vascular calcifications are noted.  IMPRESSION:  1.  No acute intracranial or calvarial findings. 2.  Stable remote right MCA infarct.   Original Report Authenticated By: Gerrianne Scale, M.D.      1530:  EKG changed from previous.  Troponin negative.  +orthostatic.  Question if pt is able to care for himself/family can care for him safely at home.  Dx testing d/w pt.  Questions answered.  Verb understanding, agreeable to admit.  T/C to Dr. Felecia Shelling, case discussed, including:  HPI, pertinent PM/SHx, VS/PE, dx testing, ED course and treatment:  Agreeable to observation admit, requests to  write temporary orders, obtain tele bed.   Laray Anger, DO 06/02/12 2128

## 2012-06-02 LAB — URINE CULTURE

## 2012-06-02 LAB — CARDIAC PANEL(CRET KIN+CKTOT+MB+TROPI)
CK, MB: 1.9 ng/mL (ref 0.3–4.0)
CK, MB: 1.8 ng/mL (ref 0.3–4.0)
Relative Index: INVALID (ref 0.0–2.5)
Total CK: 46 U/L (ref 7–232)
Troponin I: <0.3 ng/mL (ref <0.30)

## 2012-06-02 MED ORDER — ATORVASTATIN CALCIUM 20 MG PO TABS
20.0000 mg | ORAL_TABLET | Freq: Every day | ORAL | Status: DC
  Administered 2012-06-02 – 2012-06-06 (×5): 20 mg via ORAL
  Filled 2012-06-02 (×5): qty 1

## 2012-06-02 NOTE — Clinical Social Work Note (Signed)
Patient information submitted for PAS[facility] screen for ALF placement, [facility] assigned, will require face to face evaluation.  Clovis Cao Clinical Social Worker [phone])
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
## 2012-06-02 NOTE — Clinical Social Work Note (Signed)
CSW received call from pt's wife requesting that he be placed at "[address] [facility]). CSW informed her that pt has requested for family to not have information regarding his care. She is aware that he will have to be placed at the appropriate level of care and it is up to him to notify family if he chooses to. She was concerned that pt would miss his court date if family was not aware of where he went and she wanted to arrange transport for this. Pt's wife was reminded once again that pt has the responsibility to contact who he chooses and no further information would be discussed.   Derenda Fennel, [place] [phone]
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
## 2012-06-02 NOTE — Care Management Note (Signed)
    Page 1 of 2   06/07/2012     10:45:05 AM   CARE MANAGEMENT NOTE 06/07/2012  Patient:  [name], [name]   Account Number:  [id number]  Date Initiated:  06/02/2012  Documentation initiated by:  Sharrie Rothman  Subjective/Objective Assessment:   Pt admitted from home with CP. Pt is currently estranged from his wife and is living with his son, niece, and her boyfriend. Per PT pt needs ALF at discharge. Pt has a wheelchair and hospital bed at the home he shared with his wife.     Action/Plan:   CSW is aware of PT recommendations and is actively searching for an ALF bed. CM will follow for any HH needs.   Anticipated DC Date:  06/03/2012   Anticipated DC Plan:  ASSISTED LIVING / REST HOME  In-house referral  Clinical Social Worker      DC Planning Services  CM consult      Choice offered to / List presented to:             Status of service:  Completed, signed off Medicare Important Message given?   (If response is "NO", the following Medicare IM given date fields will be blank) Date Medicare IM given:   Date Additional Medicare IM given:    Discharge Disposition:  ASSISTED LIVING  Per UR Regulation:    If discussed at Long Length of Stay Meetings, dates discussed:   06/07/2012    Comments:  06/07/12 1036 Arlyss Queen, RN BSN CM Pt discharged to [facility]F today. CSW will arrange discharge to facility. Pt will receive TB skin test prior to discharge and nurse at facility will read in 48 hours. No CM/HH needs at this time.  06/06/12 1148 Arlyss Queen, RN BSN CM CM spoke with pts niece [name],, who pt was living with prior pt hospitalization, about possibility of returning to her house at discharge. Pts niece stated that she is disabled and unable to provide the level of care that the pt needs. Aurther Loft stated that pt needs total care and she just cannot provide that at this time. She would like the pt to go get rehab and then possibly return to her house at  discharge. CSW is actively searching for beds and pt is level 2 pasarr. Pt is ready for discharge once bed found and level 2 pasarr available. 06/02/12 1525 Arlyss Queen, RN BSN CM

## 2012-06-02 NOTE — Consult Note (Signed)
ANTICOAGULATION CONSULT NOTE - Initial Consult  Pharmacy Consult for Lovenox Indication: VTE prophylaxis  Allergies  Allergen Reactions  . Other Anaphylaxis    Muscle relaxers  . Flomax (Tamsulosin Hcl) Other (See Comments)    Patient cant remember  . Morphine And Related Itching   Patient Measurements: Height: 5' 8.5" (174 cm) Weight: 279 lb (126.554 kg) IBW/kg (Calculated) : 69.55   Vital Signs: Temp: 97.8 F (36.6 C) (08/21 2309) Temp src: Oral (08/21 2309) BP: 149/81 mmHg (08/21 2309) Pulse Rate: 97  (08/21 2309)  Labs:  Basename 06/02/12 0200 06/01/12 1906 06/01/12 1427  HGB -- -- 12.7*  HCT -- -- 38.0*  PLT -- -- 202  APTT -- -- --  LABPROT -- -- --  INR -- -- --  HEPARINUNFRC -- -- --  CREATININE -- -- 0.66  CKTOTAL 46 56 --  CKMB 1.9 2.2 --  TROPONINI <0.30 <0.30 <0.30    Estimated Creatinine Clearance: 128.3 ml/min (by C-G formula based on Cr of 0.66).  Medical History: Past Medical History  Diagnosis Date  . Diabetes mellitus   . Hypertension   . Stroke   . Hyperlipidemia   . Internal carotid artery occlusion     right  . Left carotid artery stenosis   . Peripheral neuropathy   . Chronic back pain   . Kidney stones    Medications:  Scheduled:    . sodium chloride   Intravenous STAT  . diltiazem  120 mg Oral Daily  . docusate sodium  200 mg Oral Daily  . enoxaparin (LOVENOX) injection  60 mg Subcutaneous Q24H  . finasteride  5 mg Oral Daily  . gabapentin  100 mg Oral TID  . loratadine  10 mg Oral Daily  . metFORMIN  2,000 mg Oral BID WC  . pantoprazole  40 mg Oral Daily  . PARoxetine  20 mg Oral Daily  . rOPINIRole  0.25 mg Oral QHS  . simvastatin  40 mg Oral QPM  . sodium chloride  250 mL Intravenous Once  . Vitamin D (Ergocalciferol)  50,000 Units Oral Q7 days  . DISCONTD: enoxaparin (LOVENOX) injection  40 mg Subcutaneous Q24H   Assessment: 61yo obese male with good renal fxn.  Goal of Therapy:  VTE prophylaxis Monitor  platelets by anticoagulation protocol: Yes   Plan: Lovenox 0.5mg /Kg sq q24hrs CBC per protocol  Valrie Hart A 06/02/2012,8:29 AM

## 2012-06-02 NOTE — Progress Notes (Signed)
Pt observed having delusional thoughts, states, " I just want to die." Told social worker, " you can just take me outside and shoot me with a gun." States, " I have my burial site already."  MD notified, new orders for ACT consult. Will continue to monitor.

## 2012-06-02 NOTE — BH Assessment (Signed)
Assessment Note   [name] is an 61 y.o. male. Patient has history of several strokes which has lead to a decrease in his ability to independently care for himself. He has also had an increase in falls which has lead to increased hospital visits. Patient made a statement of "You might as well take me out back and shot me like a horse." to staff today. Patient stated that it just made since to him but that he was not suicidal and has never been. He states that it was a response to his feeling useless and his loss of independence. He has been very independent all his live and this is a major adjustment for him having to need help with just going to the bathroom.  Patient denies suicidality, homicidality and Auditory and visual hallucination. He has no prior history of prior or of hospitalization. Patient does not meet criteria for inpatient psychiatric hospitalization and will continue services with Social Work in placement in skilled nursing facility.  Axis I: Adjustment Disorder NOS Axis II: Deferred Axis III:  Past Medical History  Diagnosis Date  . Diabetes mellitus   . Hypertension   . Stroke   . Hyperlipidemia   . Internal carotid artery occlusion     right  . Left carotid artery stenosis   . Peripheral neuropathy   . Chronic back pain   . Kidney stones    Axis IV: housing problems, other psychosocial or environmental problems, problems related to social environment and problems with primary support group Axis V: 61-70 mild symptoms  Past Medical History:  Past Medical History  Diagnosis Date  . Diabetes mellitus   . Hypertension   . Stroke   . Hyperlipidemia   . Internal carotid artery occlusion     right  . Left carotid artery stenosis   . Peripheral neuropathy   . Chronic back pain   . Kidney stones     Past Surgical History  Procedure Date  . Fracture surgery     Family History: History reviewed. No pertinent family history.  Social History:  reports that  he has been smoking Cigarettes.  He has been smoking about 1 pack per day. He does not have any smokeless tobacco history on file. He reports that he does not drink alcohol or use illicit drugs.  Additional Social History:  Alcohol / Drug Use History of alcohol / drug use?: No history of alcohol / drug abuse  CIWA: CIWA-Ar BP: 149/81 mmHg Pulse Rate: 97  COWS:    Allergies:  Allergies  Allergen Reactions  . Other Anaphylaxis    Muscle relaxers  . Flomax (Tamsulosin Hcl) Other (See Comments)    Patient cant remember  . Morphine And Related Itching    Home Medications:  Medications Prior to Admission  Medication Sig Dispense Refill  . ALPRAZolam (XANAX XR) 1 MG 24 hr tablet Take 1 mg by mouth 5 (five) times daily. For anxiety      . clopidogrel (PLAVIX) 75 MG tablet Take 75 mg by mouth daily.      . diclofenac sodium (VOLTAREN) 1 % GEL Apply 1 application topically 2 (two) times daily. Apply to left shoulder      . diltiazem (CARDIZEM CD) 120 MG 24 hr capsule Take 120 mg by mouth daily.      Marland Kitchen docusate sodium (COLACE) 100 MG capsule Take 200 mg by mouth daily.      Marland Kitchen esomeprazole (NEXIUM) 40 MG capsule Take 40 mg by  mouth daily before breakfast.      . finasteride (PROSCAR) 5 MG tablet Take 5 mg by mouth daily.      . furosemide (LASIX) 40 MG tablet Take 40 mg by mouth daily.      Marland Kitchen gabapentin (NEURONTIN) 100 MG capsule Take 100 mg by mouth 3 (three) times daily.      Marland Kitchen HYDROcodone-acetaminophen (NORCO) 10-325 MG per tablet Take 1 tablet by mouth every 6 (six) hours as needed for pain.  30 tablet  0  . loratadine (CLARITIN) 10 MG tablet Take 10 mg by mouth daily.      . metFORMIN (GLUCOPHAGE) 1000 MG tablet Take 2,000 mg by mouth 2 (two) times daily with a meal.       . PARoxetine (PAXIL) 20 MG tablet Take 20 mg by mouth daily.      Marland Kitchen rOPINIRole (REQUIP) 0.25 MG tablet Take 0.25 mg by mouth at bedtime.      . Vitamin D, Ergocalciferol, (DRISDOL) 50000 UNITS CAPS Take 50,000 Units  by mouth every 7 (seven) days. Provider told patient he did not need to take in the summer      . simvastatin (ZOCOR) 40 MG tablet Take 40 mg by mouth every evening.        OB/GYN Status:  No LMP for male patient.  General Assessment Data Location of Assessment: [facility] ACT Assessment: Yes Living Arrangements: Other relatives;Children Can pt return to current living arrangement?: No Admission Status: Voluntary Is patient capable of signing voluntary admission?: Yes Transfer from: Acute Hospital Referral Source: MD  Education Status Is patient currently in school?: No  Risk to self Suicidal Ideation: No Suicidal Intent: No Is patient at risk for suicide?: No Suicidal Plan?: No Access to Means: No What has been your use of drugs/alcohol within the last 12 months?:  (Denies) Previous Attempts/Gestures: No How many times?:  (None) Other Self Harm Risks:  (None identified) Triggers for Past Attempts:  (None) Intentional Self Injurious Behavior: None Family Suicide History: No Recent stressful life event(s):  (Recurrent falls) Persecutory voices/beliefs?: No Depression: No Depression Symptoms:  (None identified) Substance abuse history and/or treatment for substance abuse?: No Suicide prevention information given to non-admitted patients: Not applicable  Risk to Others Homicidal Ideation: No Thoughts of Harm to Others: No Current Homicidal Intent: No Current Homicidal Plan: No Access to Homicidal Means: No History of harm to others?: No Assessment of Violence: None Noted Does patient have access to weapons?: No Criminal Charges Pending?: No Does patient have a court date: Yes Court Date:  (Technical sales engineer)  Psychosis Hallucinations: None noted Delusions: None noted  Mental Status Report Appear/Hygiene:  (WNL) Eye Contact: Good Motor Activity: Unsteady Speech: Logical/coherent Level of Consciousness: Alert Mood:  (WNL) Affect: Appropriate to circumstance Anxiety  Level: None Thought Processes: Coherent;Relevant Judgement: Unimpaired Orientation: Person;Place;Time;Situation Obsessive Compulsive Thoughts/Behaviors: None  Cognitive Functioning Concentration: Normal Memory: Recent Intact;Remote Intact IQ: Average Insight: Good Impulse Control: Good Appetite: Fair Sleep: No Change Total Hours of Sleep:  (4-6 hours broken) Vegetative Symptoms: None  ADLScreening [facility] Assessment Services) Patient's cognitive ability adequate to safely complete daily activities?: Yes Patient able to express need for assistance with ADLs?: Yes Independently performs ADLs?: No  Abuse/Neglect [facility]) Physical Abuse: Denies Verbal Abuse: Denies Sexual Abuse: Denies  Prior Inpatient Therapy Prior Inpatient Therapy: No  Prior Outpatient Therapy Prior Outpatient Therapy: No  ADL Screening (condition at time of admission) Patient's cognitive ability adequate to safely complete daily activities?: Yes Patient able  to express need for assistance with ADLs?: Yes Independently performs ADLs?: No Communication: Independent Dressing (OT): Needs assistance Is this a change from baseline?: Change from baseline, expected to last <3days Grooming: Needs assistance Is this a change from baseline?: Change from baseline, expected to last <3 days Feeding: Independent Bathing: Needs assistance Is this a change from baseline?: Change from baseline, expected to last >3 days Toileting: Needs assistance Is this a change from baseline?: Change from baseline, expected to last <3 days In/Out Bed: Needs assistance Is this a change from baseline?: Change from baseline, expected to last <3 days Walks in Home: Independent Is this a change from baseline?: Pre-admission baseline Weakness of Legs: Both Weakness of Arms/Hands: Both  Home Assistive Devices/Equipment Home Assistive Devices/Equipment: Cane (specify quad or straight) (braces x2 BLE diabetic shoes)  Therapy Consults  (therapy consults require a physician order) PT Evaluation Needed: Yes (Comment) OT Evalulation Needed: No SLP Evaluation Needed: No Abuse/Neglect Assessment (Assessment to be complete while patient is alone) Physical Abuse: Denies Verbal Abuse: Denies Sexual Abuse: Denies Exploitation of patient/patient's resources: Denies Self-Neglect: Denies Values / Beliefs Cultural Requests During Hospitalization: None Spiritual Requests During Hospitalization: None Consults Spiritual Care Consult Needed: No Social Work Consult Needed: No Merchant navy officer (For Healthcare) Advance Directive: Patient does not have advance directive Pre-existing out of facility DNR order (yellow form or pink MOST form): No Nutrition Screen- [facility] Adult/[facility] Patient's home diet: Carb modified Have you recently lost weight without trying?: No Have you been eating poorly because of a decreased appetite?: No Malnutrition Screening Tool Score: 0   Additional Information 1:1 In Past 12 Months?: No CIRT Risk: No Elopement Risk: No Does patient have medical clearance?: Yes     Disposition:  Disposition Disposition of Patient: Other dispositions Other disposition(s):  (Skilled Nursing Facility)  On Site Evaluation by:   Reviewed with Physician:     Rudi Coco 06/02/2012 1:06 PM

## 2012-06-02 NOTE — Progress Notes (Signed)
Subjective: Feels better today. His chest pain is less. He is resting.  Objective: Vital signs in last 24 hours: Temp:  [97.5 F (36.4 C)-98 F (36.7 C)] 97.8 F (36.6 C) (08/21 2309) Pulse Rate:  [63-97] 97  (08/21 2309) Resp:  [18-20] 20  (08/21 2309) BP: (104-149)/(48-81) 149/81 mmHg (08/21 2309) SpO2:  [93 %-100 %] 93 % (08/21 2309) Weight:  [125.193 kg (276 lb)-126.554 kg (279 lb)] 126.554 kg (279 lb) (08/21 1825) Weight change:  Last BM Date: 05/29/12  Intake/Output from previous day: 08/21 0701 - 08/22 0700 In: 1000 [I.V.:1000] Out: 2300 [Urine:2300]  PHYSICAL EXAM General appearance: alert and no distress Resp: clear to auscultation bilaterally Cardio: S1, S2 normal GI: soft, non-tender; bowel sounds normal; no masses,  no organomegaly Extremities: extremities normal, atraumatic, no cyanosis or edema  Lab Results:    @labtest @ [facility] No results found for this basename: PHART,PCO2,PO2ART,TCO2,HCO3 in the last 72 hours CULTURES No results found for this or any previous visit (from the past 240 hour(s)). Studies/Results: Dg Ribs Unilateral W/chest Left  06/01/2012  *RADIOLOGY REPORT*  Clinical Data: Left anterior chest pain status post fall.  History of stroke.  LEFT RIBS AND CHEST - 3+ VIEW  Comparison: Chest radiographs 03/14/2012 and 06/10/2011.  Findings: There is mild cortical irregularity of several of the inferior left ribs anteriorly, most consistent with old healed fractures.  No definite acute displaced rib fracture is demonstrated.  There is no pleural effusion or pneumothorax.  Heart size and mediastinal contours are stable.  The lungs are clear. Left arm contractures noted.  IMPRESSION: Suspected old rib fractures on the left.  No definite acute rib fracture, pleural effusion or pneumothorax.   Original Report Authenticated By: Gerrianne Scale, M.D.    Ct Head Wo Contrast  06/01/2012  *RADIOLOGY REPORT*  Clinical Data: Status post fall.  History of stroke.   CT HEAD WITHOUT CONTRAST  Technique:  Contiguous axial images were obtained from the base of the skull through the vertex without contrast.  Comparison: Head CT 03/14/2012 and 03/11/2012.  Findings: There is stable chronic encephalomalacia within the right MCA distribution with associated ex vacuo ventricular dilatation. There is no evidence of acute intracranial hemorrhage, mass lesion, brain edema or extra-axial fluid collection.  There is no evidence of acute infarct.  Mild nasal bone deformity appears stable.  There is no evidence of acute calvarial fracture.  Visualized paranasal sinuses and mastoid air cells are clear.  Intracranial vascular calcifications are noted.  IMPRESSION:  1.  No acute intracranial or calvarial findings. 2.  Stable remote right MCA infarct.   Original Report Authenticated By: Gerrianne Scale, M.D.     Medications: I have reviewed the patient's current medications.  Assesment: 1. Chest pain  2.Recurrent fall  3. S/P CVA  4. DM type II  5. Hypertension  6. Hyperlipedemia  Active Problems:  * No active hospital problems. *     Plan: Cardiology consult Physical therapy Continue telemetry Diuretics on hold.    LOS: 1 day   Steffani Dionisio 06/02/2012, 7:58 AM

## 2012-06-02 NOTE — Progress Notes (Signed)
Pt's spouse [name] called concerned about pt, unable to give spouse information due to pt request. Took number from [place] and explained social worker will call her.

## 2012-06-02 NOTE — Progress Notes (Signed)
UR Chart Review Completed  

## 2012-06-02 NOTE — Clinical Social Work Placement (Signed)
    Clinical Social Work Department CLINICAL SOCIAL WORK PLACEMENT NOTE 06/07/2012  Patient:  [name], [name]  Account Number:  [id number] Admit date:  06/01/2012  Clinical Social Worker:  Santa Genera, CLINICAL SOCIAL WORKER  Date/time:  06/02/2012 12:00 N  Clinical Social Work is seeking post-discharge placement for this patient at the following level of care:   ASSISTED LIVING/REST HOME   (*CSW will update this form in Epic as items are completed)   06/02/2012  Patient/family provided with [facility] Department of Clinical Social Work's list of facilities offering this level of care within the geographic area requested by the patient (or if unable, by the patient's family).  06/02/2012  Patient/family informed of their freedom to choose among providers that offer the needed level of care, that participate in Medicare, Medicaid or managed care program needed by the patient, have an available bed and are willing to accept the patient.    Patient/family informed of [facility]' ownership interest in [facility], as well as of the fact that they are under no obligation to receive care at this facility.  PASARR submitted to EDS on 06/02/2012 PASARR number received from EDS on   FL2 transmitted to all facilities in geographic area requested by pt/family on  06/02/2012 FL2 transmitted to all facilities within larger geographic area on   Patient informed that his/her managed care company has contracts with or will negotiate with  certain facilities, including the following:     Patient/family informed of bed offers received:  06/03/2012 Patient chooses bed at [facility] Physician recommends and patient chooses bed at    Patient to be transferred to [facility] on  06/07/2012 Patient to be transferred to facility by Facility transport Zenaida Niece  The following physician request were entered in Epic:   Additional Comments: Level II PASARR  received for ALF.  MUST ID [id number]  [facility] Tracking form given to facility to complete within 5 days as instructed.  Clovis Cao Clinical Social Worker [phone])

## 2012-06-02 NOTE — H&P (Signed)
[name] MRN: [id number] DOB/AGE: 60/04/1951 60 y.o. Primary Care Physician:Marykate Heuberger, MD Admit date: 06/01/2012 Chief Complaint:  Chest pain and recurrent fall HPI:  This is a 61 years old male patient with history of multiple medical illness who came to ER with above complaint. He was seen in Er several times due to accidental fall. He has unstable gait due to his previous stroke. He is ambulating with cane. Patient had previous rib fracture. He is complaining of lt side chest pain. His EKG showed new ST depression but his cardiac enzyme were negative. He was admitted for further evaluation.  Past Medical History  Diagnosis Date  . Diabetes mellitus   . Hypertension   . Stroke   . Hyperlipidemia   . Internal carotid artery occlusion     right  . Left carotid artery stenosis   . Peripheral neuropathy   . Chronic back pain   . Kidney stones    Past Surgical History  Procedure Date  . Fracture surgery         History reviewed. No pertinent family history.  Social History:  reports that he has been smoking Cigarettes.  He has been smoking about 1 pack per day. He does not have any smokeless tobacco history on file. He reports that he does not drink alcohol or use illicit drugs.   Allergies:  Allergies  Allergen Reactions  . Other Anaphylaxis    Muscle relaxers  . Flomax (Tamsulosin Hcl) Other (See Comments)    Patient cant remember  . Morphine And Related Itching    Medications Prior to Admission  Medication Sig Dispense Refill  . ALPRAZolam (XANAX XR) 1 MG 24 hr tablet Take 1 mg by mouth 5 (five) times daily. For anxiety      . clopidogrel (PLAVIX) 75 MG tablet Take 75 mg by mouth daily.      . diclofenac sodium (VOLTAREN) 1 % GEL Apply 1 application topically 2 (two) times daily. Apply to left shoulder      . diltiazem (CARDIZEM CD) 120 MG 24 hr capsule Take 120 mg by mouth daily.      Marland Kitchen docusate sodium (COLACE) 100 MG capsule Take 200 mg by mouth daily.       Marland Kitchen esomeprazole (NEXIUM) 40 MG capsule Take 40 mg by mouth daily before breakfast.      . finasteride (PROSCAR) 5 MG tablet Take 5 mg by mouth daily.      . furosemide (LASIX) 40 MG tablet Take 40 mg by mouth daily.      Marland Kitchen gabapentin (NEURONTIN) 100 MG capsule Take 100 mg by mouth 3 (three) times daily.      Marland Kitchen HYDROcodone-acetaminophen (NORCO) 10-325 MG per tablet Take 1 tablet by mouth every 6 (six) hours as needed for pain.  30 tablet  0  . loratadine (CLARITIN) 10 MG tablet Take 10 mg by mouth daily.      . metFORMIN (GLUCOPHAGE) 1000 MG tablet Take 2,000 mg by mouth 2 (two) times daily with a meal.       . PARoxetine (PAXIL) 20 MG tablet Take 20 mg by mouth daily.      Marland Kitchen rOPINIRole (REQUIP) 0.25 MG tablet Take 0.25 mg by mouth at bedtime.      . Vitamin D, Ergocalciferol, (DRISDOL) 50000 UNITS CAPS Take 50,000 Units by mouth every 7 (seven) days. Provider told patient he did not need to take in the summer      . simvastatin (ZOCOR) 40 MG tablet  Take 40 mg by mouth every evening.           ZOX:WRUEA from the symptoms mentioned above,there are no other symptoms referable to all systems reviewed.  Physical Exam: Blood pressure 149/81, pulse 97, temperature 97.8 F (36.6 C), temperature source Oral, resp. rate 20, height 5' 8.5" (1.74 m), weight 126.554 kg (279 lb), SpO2 93.00%. HE ENT - pupils are equal and reactive Respiratory- decreased air entry, bilateral rhonchi CVS- S1 & S2 heard regular Abdome- soft and lax, bowel sound + Ext- no leg edema     Basename 06/01/12 1427  WBC 10.7*  NEUTROABS 6.2  HGB 12.7*  HCT 38.0*  MCV 93.8  PLT 202    Basename 06/01/12 1427  NA 138  K 4.0  CL 104  CO2 25  GLUCOSE 74  BUN 7  CREATININE 0.66  CALCIUM 9.2  MG --  lablast2(ast:2,ALT:2,alkphos:2,bilitot:2,prot:2,albumin:2)@    No results found for this or any previous visit (from the past 240 hour(s)).   Dg Ribs Unilateral W/chest Left  06/01/2012  *RADIOLOGY REPORT*   Clinical Data: Left anterior chest pain status post fall.  History of stroke.  LEFT RIBS AND CHEST - 3+ VIEW  Comparison: Chest radiographs 03/14/2012 and 06/10/2011.  Findings: There is mild cortical irregularity of several of the inferior left ribs anteriorly, most consistent with old healed fractures.  No definite acute displaced rib fracture is demonstrated.  There is no pleural effusion or pneumothorax.  Heart size and mediastinal contours are stable.  The lungs are clear. Left arm contractures noted.  IMPRESSION: Suspected old rib fractures on the left.  No definite acute rib fracture, pleural effusion or pneumothorax.   Original Report Authenticated By: Gerrianne Scale, M.D.    Ct Head Wo Contrast  06/01/2012  *RADIOLOGY REPORT*  Clinical Data: Status post fall.  History of stroke.  CT HEAD WITHOUT CONTRAST  Technique:  Contiguous axial images were obtained from the base of the skull through the vertex without contrast.  Comparison: Head CT 03/14/2012 and 03/11/2012.  Findings: There is stable chronic encephalomalacia within the right MCA distribution with associated ex vacuo ventricular dilatation. There is no evidence of acute intracranial hemorrhage, mass lesion, brain edema or extra-axial fluid collection.  There is no evidence of acute infarct.  Mild nasal bone deformity appears stable.  There is no evidence of acute calvarial fracture.  Visualized paranasal sinuses and mastoid air cells are clear.  Intracranial vascular calcifications are noted.  IMPRESSION:  1.  No acute intracranial or calvarial findings. 2.  Stable remote right MCA infarct.   Original Report Authenticated By: Gerrianne Scale, M.D.    Ct Abdomen Pelvis W Contrast  05/29/2012  *RADIOLOGY REPORT*  Clinical Data: Abdominal pain  CT ABDOMEN AND PELVIS WITH CONTRAST  Technique:  Multidetector CT imaging of the abdomen and pelvis was performed following the standard protocol during bolus administration of intravenous contrast.   Contrast: OMNIPAQUE IOHEXOL 300 MG/ML  SOLN  Comparison: CT abdomen 08/31/2011  Findings: Lung bases are clear aside from mild scarring in the lingula.  Liver gallbladder and bile ducts are normal.  Pancreas and spleen are normal.  Dilated extrarenal pelvis on the left is unchanged. No hydronephrosis.  Cortical renal cysts bilaterally are unchanged from the prior study.  No kidney stone.  Negative for bowel obstruction or bowel thickening.  Appendix is normal.  Negative for bowel thickening.  No free fluid or adenopathy.  IMPRESSION: No acute abnormality and no change from  the prior study.  Original Report Authenticated By: Camelia Phenes, M.D.   Impression: 1. Chest pain 2.Recurrent fall 3. S/P CVA 4. DM type II 5. Hypertension 6. Hyperlipedemia Active Problems:  * No active hospital problems. *      Plan: 1. Serial Ekg and cardiac enzyme 2. Monitor Orthostatic B/p  3. Cardiology consult 4. Physical therapy 5. Continue current treatment.     Anwar Sakata Pager [phone]  06/02/2012, 7:42 AM

## 2012-06-03 ENCOUNTER — Encounter (HOSPITAL_COMMUNITY): Payer: Self-pay | Admitting: Adult Health

## 2012-06-03 DIAGNOSIS — I639 Cerebral infarction, unspecified: Secondary | ICD-10-CM

## 2012-06-03 DIAGNOSIS — E1159 Type 2 diabetes mellitus with other circulatory complications: Secondary | ICD-10-CM

## 2012-06-03 DIAGNOSIS — R0789 Other chest pain: Principal | ICD-10-CM

## 2012-06-03 DIAGNOSIS — I517 Cardiomegaly: Secondary | ICD-10-CM

## 2012-06-03 DIAGNOSIS — I10 Essential (primary) hypertension: Secondary | ICD-10-CM

## 2012-06-03 DIAGNOSIS — R9431 Abnormal electrocardiogram [ECG] [EKG]: Secondary | ICD-10-CM

## 2012-06-03 LAB — CBC
HCT: 36.4 % — ABNORMAL LOW (ref 39.0–52.0)
MCH: 31.2 pg (ref 26.0–34.0)
MCHC: 33.2 g/dL (ref 30.0–36.0)
RDW: 14.4 % (ref 11.5–15.5)

## 2012-06-03 LAB — BASIC METABOLIC PANEL
BUN: 6 mg/dL (ref 6–23)
Creatinine, Ser: 0.6 mg/dL (ref 0.50–1.35)
GFR calc Af Amer: >90 mL/min (ref >90)
GFR calc non Af Amer: >90 mL/min (ref >90)
Glucose, Bld: 119 mg/dL — ABNORMAL HIGH (ref 70–99)

## 2012-06-03 MED ORDER — LISINOPRIL 5 MG PO TABS
2.5000 mg | ORAL_TABLET | Freq: Every day | ORAL | Status: DC
  Administered 2012-06-03 – 2012-06-07 (×5): 2.5 mg via ORAL
  Filled 2012-06-03 (×5): qty 1

## 2012-06-03 MED ORDER — NAPROXEN 250 MG PO TABS
500.0000 mg | ORAL_TABLET | Freq: Two times a day (BID) | ORAL | Status: DC
  Administered 2012-06-03 – 2012-06-06 (×7): 500 mg via ORAL
  Filled 2012-06-03 (×4): qty 2
  Filled 2012-06-03 (×2): qty 1
  Filled 2012-06-03 (×3): qty 2

## 2012-06-03 NOTE — Consult Note (Signed)
CARDIOLOGY CONSULT NOTE  Patient ID: [name] MRN: [id number] DOB/AGE: [birth year] 60 y.o.  Admit date: 06/01/2012 Referring Physician: Tonny Bollman, MD Primary Cardiologist: [name] Reason for Consultation: Chest Pain Active Problems:  CVA (cerebral infarction)  Atypical chest pain  Diabetes mellitus  Hypertension  HPI: Mr. [name] is a 61 year old obese patient of Dr. Felecia [name] we are asked to see secondary to left-sided chest pain after falling at home. Patient states that the pain has been unrelenting lasting for 3 days worse with inspiration and coughing. The patient states that he was walking back from using the bathroom and lost his step and fell. No presyncope or LOC. He began having trouble breathing and called EMS who brought him to the emergency room. He has a history of carotid artery disease, CVA, hypertension, diabetes, and a peripheral neuropathy. He has a brace he wears on his left leg for stabilization. On arrival,  the patient's blood pressure is 112/64 heart rate 77 he was afebrile. Chest x-ray did not reveal any rib fractures or pleural effusion. He had a CT scan of his head which showed no acute intracranial or calvarial findings. His cardiac enzymes are negative x4. EKG reveals sinus rhythm with frequent PACs with no acute ischemia.     The patient has slurred speech related to his CVA but is able to communicate. He has contractures of his left arm as well. Review of notes also states that he has been having hallucinations, and behavioral issues during this hospitalization. He states he has never been treated for any heart disease or having had a history of same. He states he had treadmill test many years ago that apparently was normal and does not remember who did that test. We are asked for recommendations concerning his chest pain.  Review of systems complete and found to be negative unless listed above   Past Medical History  Diagnosis Date    . Diabetes mellitus   . Hypertension   . Stroke   . Hyperlipidemia   . Internal carotid artery occlusion     right  . Left carotid artery stenosis   . Peripheral neuropathy   . Chronic back pain   . Kidney stones     Family History: This has been asked of patient: Has no idea of family history. Parents deceased.   History   Social History  . Marital Status: Married    Spouse Name: N/A    Number of Children: N/A  . Years of Education: N/A   Occupational History  . Not on file.   Social History Main Topics  . Smoking status: Current Everyday Smoker -- 1.0 packs/day    Types: Cigarettes  . Smokeless tobacco: Not on file  . Alcohol Use: No  . Drug Use: No  . Sexually Active:    Other Topics Concern  . Not on file   Social History Narrative   Lives with [name], nieces and nephew. Unemployed    Past Surgical History  Procedure Date  . Fracture surgery      Prescriptions prior to admission  Medication Sig Dispense Refill  . ALPRAZolam (XANAX XR) 1 MG 24 hr tablet Take 1 mg by mouth 5 (five) times daily. For anxiety      . clopidogrel (PLAVIX) 75 MG tablet Take 75 mg by mouth daily.      . diclofenac sodium (VOLTAREN) 1 % GEL Apply 1 application topically 2 (two) times daily. Apply to left shoulder      .  diltiazem (CARDIZEM CD) 120 MG 24 hr capsule Take 120 mg by mouth daily.      Marland Kitchen docusate sodium (COLACE) 100 MG capsule Take 200 mg by mouth daily.      Marland Kitchen esomeprazole (NEXIUM) 40 MG capsule Take 40 mg by mouth daily before breakfast.      . finasteride (PROSCAR) 5 MG tablet Take 5 mg by mouth daily.      . furosemide (LASIX) 40 MG tablet Take 40 mg by mouth daily.      Marland Kitchen gabapentin (NEURONTIN) 100 MG capsule Take 100 mg by mouth 3 (three) times daily.      Marland Kitchen HYDROcodone-acetaminophen (NORCO) 10-325 MG per tablet Take 1 tablet by mouth every 6 (six) hours as needed for pain.  30 tablet  0  . loratadine (CLARITIN) 10 MG tablet Take 10 mg by mouth daily.      .  metFORMIN (GLUCOPHAGE) 1000 MG tablet Take 2,000 mg by mouth 2 (two) times daily with a meal.       . PARoxetine (PAXIL) 20 MG tablet Take 20 mg by mouth daily.      Marland Kitchen rOPINIRole (REQUIP) 0.25 MG tablet Take 0.25 mg by mouth at bedtime.      . Vitamin D, Ergocalciferol, (DRISDOL) 50000 UNITS CAPS Take 50,000 Units by mouth every 7 (seven) days. Provider told patient he did not need to take in the summer      . simvastatin (ZOCOR) 40 MG tablet Take 40 mg by mouth every evening.        Physical Exam: Blood pressure 131/83, pulse 62, temperature 97.5 F (36.4 C), temperature source Oral, resp. rate 20, height 5' 8.5" (1.74 m), weight 279 lb (126.554 kg), SpO2 100.00%.    General: Well developed, well nourished, in no acute distress, obese complaining of left-sided chest pain. Head: Eyes PERRLA, No xanthomas.   Normal cephalic and atraumatic. Edentulous  Lungs: Clear bilaterally to auscultation and percussion occasional crackles in the bases but poor inspiratory effort secondary to chest discomfort.  3 cm bruise over the left lateral chest where pain is centered Heart: HRRR S1 S2, without MRG.  Pulses are 2+ & equal.            No carotid bruit. No JVD.  No abdominal bruits. No femoral bruits. Abdomen: Bowel sounds are positive, abdomen soft and non-tender without masses or hernia's noted. Msk:  Back normal, left arm contracted, diminished strength in the left leg, right ankle with new well-healed suture scar. Generalized deconditioning.  Extremities: No clubbing, cyanosis or edema.  DP +1 Neuro: Alert and oriented X 3. Slurred speech with difficult expression of thought.  Psych:  Good affect, responds appropriately  Labs:   Lab Results  Component Value Date   WBC 8.8 06/03/2012   HGB 12.1* 06/03/2012   HCT 36.4* 06/03/2012   MCV 93.8 06/03/2012   PLT 188 06/03/2012     Lab 06/03/12 0443 05/29/12 1935  NA 140 --  K 3.6 --  CL 106 --  CO2 25 --  BUN 6 --  CREATININE 0.60 --  CALCIUM 8.7  --  PROT -- 7.8  BILITOT -- 0.3  ALKPHOS -- 89  ALT -- 10  AST -- 13  GLUCOSE 119* --  Radiology: Dg Ribs Unilateral W/chest Left 06/01/12-IMPRESSION: Suspected old rib fractures on the left.  No definite acute rib fracture, pleural effusion or pneumothorax.  Ct Head Wo Contrast- No acute intracranial or calvarial findings. 2.  Stable remote right  MCA infarct.    EKG: Normal sinus rhythm, poor lead voltage, rate 69 beats per minute.  ASSESSMENT AND PLAN:   1. Atypical chest pain: The patient fell after walking back from the bathroom landing on his left side. The pain is reproducible with palpation, taking deep breaths, or movement of the trunk. Cardiac enzymes are negative, EKG shows no evidence of ACS. He does have multiple cardiovascular risk factors to include PAD, diabetes, hypertension, hypercholesterolemia, and ongoing tobacco abuse. Will have echocardiogram completed for LV function. He may benefit from an outpatient stress Myoview for prognostic purposes. Anti-inflammatories are recommended for musculoskeletal pain.  2. Hypertension: Blood pressure is well-controlled on current medication regimen. Despite complaints of recurrent pain on the left side blood pressure has not been elevated. He should continue on diltiazem as directed. Recommendation for ACE inhibitor in the setting of diabetes and peripheral arterial disease for cardiorenal protection. Creatinine 0.66. Will evaluate echocardiogram for LV function, with recommendations for changes in medication should his ejection fraction be diminished.  3. Hypercholesterolemia: He is currently on atorvastatin 20 mg daily.  Bettey Mare. Lyman Bishop NP [facility] 06/03/2012, 8:19 AM  Cardiology Attending Patient interviewed and examined. Discussed with Joni Reining, NP.  Above note annotated and modified based upon my findings.  Apparent musculoskeletal pain secondary to trauma. CT scan of the chest could be performed to rule out  a nondisplaced rib fracture, but this would not change therapy.  Nonsteroidal added to current management. No diagnostic testing is necessary.  Algodones Bing, MD 06/03/2012, 3:52 PM

## 2012-06-03 NOTE — Progress Notes (Signed)
Physical Therapy Treatment Patient Details Name: [name] MRN: [id number] DOB: 03/07/1951 Today's Date: 06/03/2012 Time: 4098-1191 PT Time Calculation (min): 35 min  PT Assessment / Plan / Recommendation Comments on Treatment Session  Pt is very cooperative but does not seem to understand just how unsafe and impaired his mobility is.He states that his CAM walker can come off in 1 week.  If this is true, hopefully his gait might be more stable.  His ankle will, however, be weak and therefore he may never have the stability he needs for safe gait.    Follow Up Recommendations       Barriers to Discharge        Equipment Recommendations       Recommendations for Other Services    Frequency     Plan Discharge plan remains appropriate;Frequency remains appropriate    Precautions / Restrictions Restrictions Weight Bearing Restrictions: No   Pertinent Vitals/Pain     Mobility  Bed Mobility Rolling Right: 5: Supervision Right Sidelying to Sit: 4: Min assist Sitting - Scoot to Edge of Bed: 4: Min guard Details for Bed Mobility Assistance: Pt states that he would transfer better if his rib pain was better Transfers Stand Pivot Transfers: 3: Mod assist Details for Transfer Assistance: Pt is unsafe with transfer as he tends to "throw" his trunk laterally in order to transfer rather than actual weight bearing on LEs...he was instructed in correct technique Ambulation/Gait Ambulation/Gait Assistance: Not tested (comment)    Exercises     PT Diagnosis:    PT Problem List:   PT Treatment Interventions:     PT Goals Acute Rehab PT Goals Pt will go Supine/Side to Sit: with modified independence;with HOB 0 degrees PT Goal: Supine/Side to Sit - Progress: Updated due to goal met Pt will Transfer Bed to Chair/Chair to Bed: with min assist PT Transfer Goal: Bed to Chair/Chair to Bed - Progress: Goal set today  Visit Information  Last PT Received On: 06/03/12    Subjective Data  Subjective: I feel about the same   Cognition       Balance     End of Session PT - End of Session Equipment Utilized During Treatment: Gait belt Activity Tolerance: Patient tolerated treatment well;Patient limited by pain Patient left: in bed;with chair alarm set;with call bell/phone within reach Nurse Communication: Mobility status   GP     [name] 06/03/2012, 11:47 AM

## 2012-06-03 NOTE — Clinical Social Work Note (Signed)
Patient informed of ALF bed offers made by [facility] ([place]), [facility] [place]).  Patient wanted to contact his niece as he had "already paid my month's rent" and felt he should be able to return there.  Assisted patient in contacting niece and her husband Perlie Gold.  Neither answered phone when called from CSW's phone and patient phone.  Patient stated that he would choose a facility in [place], [place] "if I had to go somewhere."  CSW still waiting on PASARR screener, but abovementioned facilities are willing to accept patient at discharge.  Clovis Cao Clinical Social Worker [phone])

## 2012-06-03 NOTE — Progress Notes (Signed)
UR Chart Review Completed  

## 2012-06-03 NOTE — Progress Notes (Signed)
*  PRELIMINARY RESULTS* Echocardiogram 2D Echocardiogram has been performed.  [name] Jane 06/03/2012, 11:12 AM 

## 2012-06-03 NOTE — Progress Notes (Signed)
Subjective Nursing team reports that he is confused and hallucinating. Behavioral consult has been requested. He is still complaining  Of chest discomfort. Objective: Vital signs in last 24 hours: Temp:  [97.4 F (36.3 C)-98 F (36.7 C)] 97.5 F (36.4 C) (08/23 0523) Pulse Rate:  [57-62] 62  (08/23 0523) Resp:  [20] 20  (08/23 0523) BP: (103-131)/(56-83) 131/83 mmHg (08/23 0523) SpO2:  [96 %-100 %] 100 % (08/23 0523) Weight change:  Last BM Date: 05/29/12  Intake/Output from previous day: 08/22 0701 - 08/23 0700 In: 682.5 [P.O.:360; I.V.:322.5] Out: 1850 [Urine:1850]  PHYSICAL EXAM General appearance: alert and no distress Resp: clear to auscultation bilaterally Cardio: S1, S2 normal GI: soft, non-tender; bowel sounds normal; no masses,  no organomegaly Extremities: extremities normal, atraumatic, no cyanosis or edema  Lab Results:    @labtest @ [facility] No results found for this basename: PHART,PCO2,PO2ART,TCO2,HCO3 in the last 72 hours CULTURES Recent Results (from the past 240 hour(s))  URINE CULTURE     Status: Normal   Collection Time   06/01/12  2:34 PM      Component Value Range Status Comment   Specimen Description URINE, CLEAN CATCH   Final    Special Requests NONE   Final    Culture  Setup Time 06/02/2012 01:54   Final    Colony Count 2,000 COLONIES/ML   Final    Culture INSIGNIFICANT GROWTH   Final    Report Status 06/02/2012 FINAL   Final    Studies/Results: Dg Ribs Unilateral W/chest Left  06/01/2012  *RADIOLOGY REPORT*  Clinical Data: Left anterior chest pain status post fall.  History of stroke.  LEFT RIBS AND CHEST - 3+ VIEW  Comparison: Chest radiographs 03/14/2012 and 06/10/2011.  Findings: There is mild cortical irregularity of several of the inferior left ribs anteriorly, most consistent with old healed fractures.  No definite acute displaced rib fracture is demonstrated.  There is no pleural effusion or pneumothorax.  Heart size and mediastinal contours  are stable.  The lungs are clear. Left arm contractures noted.  IMPRESSION: Suspected old rib fractures on the left.  No definite acute rib fracture, pleural effusion or pneumothorax.   Original Report Authenticated By: Gerrianne Scale, M.D.    Ct Head Wo Contrast  06/01/2012  *RADIOLOGY REPORT*  Clinical Data: Status post fall.  History of stroke.  CT HEAD WITHOUT CONTRAST  Technique:  Contiguous axial images were obtained from the base of the skull through the vertex without contrast.  Comparison: Head CT 03/14/2012 and 03/11/2012.  Findings: There is stable chronic encephalomalacia within the right MCA distribution with associated ex vacuo ventricular dilatation. There is no evidence of acute intracranial hemorrhage, mass lesion, brain edema or extra-axial fluid collection.  There is no evidence of acute infarct.  Mild nasal bone deformity appears stable.  There is no evidence of acute calvarial fracture.  Visualized paranasal sinuses and mastoid air cells are clear.  Intracranial vascular calcifications are noted.  IMPRESSION:  1.  No acute intracranial or calvarial findings. 2.  Stable remote right MCA infarct.   Original Report Authenticated By: Gerrianne Scale, M.D.     Medications: I have reviewed the patient's current medications.  Assesment: 1. Chest pain  2.Recurrent fall  3. S/P CVA  4. DM type II  5. Hypertension  6. Hyperlipidemia 7. Confusion and hallucinatio   Active Problems:  * No active hospital problems. *     Plan: Cardiology consult pending Behavioral medicine consult Physical therapy Continue  telemetry Diuretics on hold.    LOS: 2 days   Lateya Dauria 06/03/2012, 7:39 AM

## 2012-06-04 LAB — GLUCOSE, CAPILLARY: Glucose-Capillary: 181 mg/dL — ABNORMAL HIGH (ref 70–99)

## 2012-06-04 MED ORDER — HYDROCODONE-ACETAMINOPHEN 10-325 MG PO TABS
1.0000 | ORAL_TABLET | ORAL | Status: DC | PRN
  Administered 2012-06-04 – 2012-06-07 (×17): 1 via ORAL
  Filled 2012-06-04 (×17): qty 1

## 2012-06-04 NOTE — Progress Notes (Signed)
Subjective Patient feels better today. He is less confused. Urine culture is negative and no sign of sepsis. No fever or chills. Objective: Vital signs in last 24 hours: Temp:  [97.5 F (36.4 C)-98.1 F (36.7 C)] 97.5 F (36.4 C) (08/24 0443) Pulse Rate:  [70-76] 74  (08/24 0443) Resp:  [18-20] 20  (08/24 0443) BP: (126-135)/(70-82) 126/70 mmHg (08/24 0443) SpO2:  [92 %-95 %] 92 % (08/24 0443) Weight change:  Last BM Date: 06/02/12  Intake/Output from previous day: 08/23 0701 - 08/24 0700 In: 887.7 [P.O.:440; I.V.:447.7] Out: 1375 [Urine:1375]  PHYSICAL EXAM General appearance: alert and no distress Resp: clear to auscultation bilaterally Cardio: S1, S2 normal GI: soft, non-tender; bowel sounds normal; no masses,  no organomegaly Extremities: extremities normal, atraumatic, no cyanosis or edema  Lab Results:    @labtest @ [facility] No results found for this basename: PHART,PCO2,PO2ART,TCO2,HCO3 in the last 72 hours CULTURES Recent Results (from the past 240 hour(s))  URINE CULTURE     Status: Normal   Collection Time   06/01/12  2:34 PM      Component Value Range Status Comment   Specimen Description URINE, CLEAN CATCH   Final    Special Requests NONE   Final    Culture  Setup Time 06/02/2012 01:54   Final    Colony Count 2,000 COLONIES/ML   Final    Culture INSIGNIFICANT GROWTH   Final    Report Status 06/02/2012 FINAL   Final    Studies/Results: No results found.  Medications: I have reviewed the patient's current medications.  Assesment: 1. Chest pain  Probably non cardiac 2. Delirium and hallucination has improved 3.Recurrent fall  4. S/P CVA  5. DM type II     Active Problems:  CVA (cerebral infarction)  Atypical chest pain  Diabetes mellitus  Hypertension    Plan: Cardiology consult appreciated We will continue to monitor his mental status Patient may need placement    LOS: 3 days   Candler Ginsberg 06/04/2012, 7:59 AM

## 2012-06-05 LAB — GLUCOSE, CAPILLARY: Glucose-Capillary: 128 mg/dL — ABNORMAL HIGH (ref 70–99)

## 2012-06-05 MED ORDER — SODIUM CHLORIDE 0.9 % IJ SOLN
INTRAMUSCULAR | Status: AC
  Administered 2012-06-05: 10 mL
  Filled 2012-06-05: qty 3

## 2012-06-05 NOTE — Progress Notes (Signed)
Received a call from the Operator asking CSW to contact "Verdis Koval" at [phone].  Contacted Mrs. Cederberg and was informed that she is Pt's wife.  Mrs. Makarewicz was asking about Pt's d/c plans whereupon CSW reiterated CSW Kara's stance of releasing Pt's information.  Mrs. Utz continued to ask about Pt and CSW suggested that she contact Pt directly.  Mrs. Mazo stated that she had Pt's number.  CSW thanked Mrs. Mccleese for her time.  Providence Crosby, LCSWA Clinical Social Work [phone]

## 2012-06-05 NOTE — Progress Notes (Signed)
Subjective Patient feels better. His appetite is improving. No new complaint Objective: Vital signs in last 24 hours: Temp:  [97.3 F (36.3 C)-97.8 F (36.6 C)] 97.8 F (36.6 C) (08/25 1442) Pulse Rate:  [68-78] 68  (08/25 1442) Resp:  [18-20] 18  (08/25 1442) BP: (112-117)/(71-77) 117/71 mmHg (08/25 1442) SpO2:  [95 %-96 %] 96 % (08/25 1442) Weight change:  Last BM Date: 06/04/12  Intake/Output from previous day: 08/24 0701 - 08/25 0700 In: 732.5 [P.O.:620; I.V.:112.5] Out: 1575 [Urine:1575]  PHYSICAL EXAM General appearance: alert and no distress Resp: clear to auscultation bilaterally Cardio: S1, S2 normal GI: soft, non-tender; bowel sounds normal; no masses,  no organomegaly Extremities: extremities normal, atraumatic, no cyanosis or edema  Lab Results:    @labtest @ [facility] No results found for this basename: PHART,PCO2,PO2ART,TCO2,HCO3 in the last 72 hours CULTURES Recent Results (from the past 240 hour(s))  URINE CULTURE     Status: Normal   Collection Time   06/01/12  2:34 PM      Component Value Range Status Comment   Specimen Description URINE, CLEAN CATCH   Final    Special Requests NONE   Final    Culture  Setup Time 06/02/2012 01:54   Final    Colony Count 2,000 COLONIES/ML   Final    Culture INSIGNIFICANT GROWTH   Final    Report Status 06/02/2012 FINAL   Final    Studies/Results: No results found.  Medications: I have reviewed the patient's current medications.  Assessment:  Active Problems:  CVA (cerebral infarction)  Atypical chest pain  Diabetes mellitus  Hypertension delirium - improving  Plan: Medications reviewed Will continue to monitor his mental status Continue supportive care.    LOS: 4 days   Evie Croston 06/05/2012, 7:12 PM

## 2012-06-06 LAB — CBC
HCT: 39.7 % (ref 39.0–52.0)
Hemoglobin: 13.3 g/dL (ref 13.0–17.0)
MCH: 31.1 pg (ref 26.0–34.0)
MCHC: 33.5 g/dL (ref 30.0–36.0)
RDW: 14.1 % (ref 11.5–15.5)

## 2012-06-06 NOTE — Clinical Social Work Note (Signed)
Per RN CM, family is not able to meet patient's needs and will not take him back into their home at discharge.  Presented bed offers to patient, who chose [facility] in [place].  Spoke w Reggie, Production designer, theatre/television/film at [facility], who stated facility is willing to accept patient and can transport patient to their facility at discharge.  Patient will be ready to go when ALF Level 2 PASARR number is issued.  Clovis Cao Clinical Social Worker [phone])

## 2012-06-06 NOTE — Clinical Social Work Note (Signed)
[name], PASARR screener, called and says she will be here to complete screen 11:30 AM today.  Requested updated progress notes, packet prepared and placed in [facility] for her review.  Clovis Cao Clinical Social Worker [phone])

## 2012-06-06 NOTE — Discharge Summary (Signed)
Physician Discharge Summary  Patient ID: [name] MRN: [id number] DOB/AGE: 1951/03/12 61 y.o. Primary Care Physician:Bryor Rami, MD Admit date: 06/01/2012 Discharge date: 06/06/2012    Discharge Diagnoses:   Active Problems:  CVA (cerebral infarction)  Atypical chest pain  Diabetes mellitus  Hypertension Delirium  Medication List  As of 06/06/2012  7:33 AM   TAKE these medications         ALPRAZolam 1 MG 24 hr tablet   Commonly known as: XANAX XR   Take 1 mg by mouth 5 (five) times daily. For anxiety      clopidogrel 75 MG tablet   Commonly known as: PLAVIX   Take 75 mg by mouth daily.      diclofenac sodium 1 % Gel   Commonly known as: VOLTAREN   Apply 1 application topically 2 (two) times daily. Apply to left shoulder      diltiazem 120 MG 24 hr capsule   Commonly known as: CARDIZEM CD   Take 120 mg by mouth daily.      docusate sodium 100 MG capsule   Commonly known as: COLACE   Take 200 mg by mouth daily.      esomeprazole 40 MG capsule   Commonly known as: NEXIUM   Take 40 mg by mouth daily before breakfast.      finasteride 5 MG tablet   Commonly known as: PROSCAR   Take 5 mg by mouth daily.      furosemide 40 MG tablet   Commonly known as: LASIX   Take 40 mg by mouth daily.      gabapentin 100 MG capsule   Commonly known as: NEURONTIN   Take 100 mg by mouth 3 (three) times daily.      HYDROcodone-acetaminophen 10-325 MG per tablet   Commonly known as: NORCO   Take 1 tablet by mouth every 6 (six) hours as needed for pain.      loratadine 10 MG tablet   Commonly known as: CLARITIN   Take 10 mg by mouth daily.      metFORMIN 1000 MG tablet   Commonly known as: GLUCOPHAGE   Take 2,000 mg by mouth 2 (two) times daily with a meal.      PARoxetine 20 MG tablet   Commonly known as: PAXIL   Take 20 mg by mouth daily.      rOPINIRole 0.25 MG tablet   Commonly known as: REQUIP   Take 0.25 mg by mouth at bedtime.      simvastatin 40 MG  tablet   Commonly known as: ZOCOR   Take 40 mg by mouth every evening.      Vitamin D (Ergocalciferol) 50000 UNITS Caps   Commonly known as: DRISDOL   Take 50,000 Units by mouth every 7 (seven) days. Provider told patient he did not need to take in the summer            Discharged Condition: stable    Consults: Cardiology consult  Significant Diagnostic Studies: Dg Ribs Unilateral W/chest Left  06/01/2012  *RADIOLOGY REPORT*  Clinical Data: Left anterior chest pain status post fall.  History of stroke.  LEFT RIBS AND CHEST - 3+ VIEW  Comparison: Chest radiographs 03/14/2012 and 06/10/2011.  Findings: There is mild cortical irregularity of several of the inferior left ribs anteriorly, most consistent with old healed fractures.  No definite acute displaced rib fracture is demonstrated.  There is no pleural effusion or pneumothorax.  Heart size and mediastinal contours are  stable.  The lungs are clear. Left arm contractures noted.  IMPRESSION: Suspected old rib fractures on the left.  No definite acute rib fracture, pleural effusion or pneumothorax.   Original Report Authenticated By: Gerrianne Scale, M.D.    Ct Head Wo Contrast  06/01/2012  *RADIOLOGY REPORT*  Clinical Data: Status post fall.  History of stroke.  CT HEAD WITHOUT CONTRAST  Technique:  Contiguous axial images were obtained from the base of the skull through the vertex without contrast.  Comparison: Head CT 03/14/2012 and 03/11/2012.  Findings: There is stable chronic encephalomalacia within the right MCA distribution with associated ex vacuo ventricular dilatation. There is no evidence of acute intracranial hemorrhage, mass lesion, brain edema or extra-axial fluid collection.  There is no evidence of acute infarct.  Mild nasal bone deformity appears stable.  There is no evidence of acute calvarial fracture.  Visualized paranasal sinuses and mastoid air cells are clear.  Intracranial vascular calcifications are noted.   IMPRESSION:  1.  No acute intracranial or calvarial findings. 2.  Stable remote right MCA infarct.   Original Report Authenticated By: Gerrianne Scale, M.D.    Ct Abdomen Pelvis W Contrast  05/29/2012  *RADIOLOGY REPORT*  Clinical Data: Abdominal pain  CT ABDOMEN AND PELVIS WITH CONTRAST  Technique:  Multidetector CT imaging of the abdomen and pelvis was performed following the standard protocol during bolus administration of intravenous contrast.  Contrast: OMNIPAQUE IOHEXOL 300 MG/ML  SOLN  Comparison: CT abdomen 08/31/2011  Findings: Lung bases are clear aside from mild scarring in the lingula.  Liver gallbladder and bile ducts are normal.  Pancreas and spleen are normal.  Dilated extrarenal pelvis on the left is unchanged. No hydronephrosis.  Cortical renal cysts bilaterally are unchanged from the prior study.  No kidney stone.  Negative for bowel obstruction or bowel thickening.  Appendix is normal.  Negative for bowel thickening.  No free fluid or adenopathy.  IMPRESSION: No acute abnormality and no change from the prior study.  Original Report Authenticated By: Camelia Phenes, M.D.    Lab Results: Basic Metabolic Panel: No results found for this basename: NA:2,K:2,CL:2,CO2:2,GLUCOSE:2,BUN:2,CREATININE:2,CALCIUM:2,MG:2,PHOS:2 in the last 72 hours Liver Function Tests: No results found for this basename: AST:2,ALT:2,ALKPHOS:2,BILITOT:2,PROT:2,ALBUMIN:2 in the last 72 hours   CBC:  Basename 06/06/12 0457  WBC 10.0  NEUTROABS --  HGB 13.3  HCT 39.7  MCV 93.0  PLT 227    Recent Results (from the past 240 hour(s))  URINE CULTURE     Status: Normal   Collection Time   06/01/12  2:34 PM      Component Value Range Status Comment   Specimen Description URINE, CLEAN CATCH   Final    Special Requests NONE   Final    Culture  Setup Time 06/02/2012 01:54   Final    Colony Count 2,000 COLONIES/ML   Final    Culture INSIGNIFICANT GROWTH   Final    Report Status 06/02/2012 FINAL    Final      Hospital Course:  This is 61 years old male patient with history of multiple medical illness admitted due to chest pain. He has history of recurrent fall. He was admitted under telemetry and serial EKG and cardiac enzyme was done. Cardiology consult was done and the chest pain was determined to be noncardiac. During the hospital stay he had acute delirium which gradually improved. Patient is planned to be placed in nursing home.  Discharge Exam: Blood pressure 116/69, pulse 87,  temperature 97.5 F (36.4 C), temperature source Oral, resp. rate 20, height 5' 8.5" (1.74 m), weight 126.554 kg (279 lb), SpO2 92.00%.   Disposition: stable      Signed: Carl Butner   06/06/2012, 7:33 AM

## 2012-06-06 NOTE — Consult Note (Signed)
ANTICOAGULATION CONSULT NOTE  Pharmacy Consult for Lovenox Indication: VTE prophylaxis  Allergies  Allergen Reactions  . Other Anaphylaxis    Muscle relaxers  . Flomax (Tamsulosin Hcl) Other (See Comments)    Patient cant remember  . Morphine And Related Itching   Patient Measurements: Height: 5' 8.5" (174 cm) Weight: 279 lb (126.554 kg) IBW/kg (Calculated) : 69.55   Vital Signs: Temp: 97.5 F (36.4 C) (08/26 0500) Temp src: Oral (08/26 0500) BP: 116/69 mmHg (08/26 0500) Pulse Rate: 87  (08/26 0500)  Labs:  Basename 06/06/12 0457  HGB 13.3  HCT 39.7  PLT 227  APTT --  LABPROT --  INR --  HEPARINUNFRC --  CREATININE --  CKTOTAL --  CKMB --  TROPONINI --    Estimated Creatinine Clearance: 128.3 ml/min (by C-G formula based on Cr of 0.6).  Medical History: Past Medical History  Diagnosis Date  . Diabetes mellitus   . Hypertension   . Stroke   . Hyperlipidemia   . Internal carotid artery occlusion     right  . Left carotid artery stenosis   . Peripheral neuropathy   . Chronic back pain   . Kidney stones    Medications:  Scheduled:     . atorvastatin  20 mg Oral q1800  . diltiazem  120 mg Oral Daily  . docusate sodium  200 mg Oral Daily  . enoxaparin (LOVENOX) injection  60 mg Subcutaneous Q24H  . finasteride  5 mg Oral Daily  . gabapentin  100 mg Oral TID  . lisinopril  2.5 mg Oral Daily  . loratadine  10 mg Oral Daily  . metFORMIN  2,000 mg Oral BID WC  . naproxen  500 mg Oral BID WC  . pantoprazole  40 mg Oral Daily  . PARoxetine  20 mg Oral Daily  . rOPINIRole  0.25 mg Oral QHS  . sodium chloride      . Vitamin D (Ergocalciferol)  50,000 Units Oral Q7 days   Assessment: 61yo obese male with good renal fxn. Renal function stable and CBC improved since admission.   Goal of Therapy:  VTE prophylaxis Monitor platelets by anticoagulation protocol: Yes   Plan: Lovenox 0.5mg /Kg sq q24hrs CBC per protocol Pharmacy to sign off- Please  re-consult as needed.   Elson Clan 06/06/2012,10:31 AM

## 2012-06-07 LAB — GLUCOSE, CAPILLARY: Glucose-Capillary: 174 mg/dL — ABNORMAL HIGH (ref 70–99)

## 2012-06-07 MED ORDER — TUBERCULIN PPD 5 UNIT/0.1ML ID SOLN
5.0000 [IU] | Freq: Once | INTRADERMAL | Status: AC
  Administered 2012-06-07: 5 [IU] via INTRADERMAL
  Filled 2012-06-07: qty 0.1

## 2012-06-07 NOTE — Progress Notes (Signed)
Subjective Patient is resting. He is doing better/ No new complaint. He is waiting for placement Objective: Vital signs in last 24 hours: Temp:  [97.7 F (36.5 C)-98.1 F (36.7 C)] 97.7 F (36.5 C) (08/27 0604) Pulse Rate:  [77-103] 88  (08/27 0604) Resp:  [20-24] 20  (08/27 0604) BP: (119-155)/(71-80) 119/80 mmHg (08/27 0604) SpO2:  [95 %-99 %] 95 % (08/27 0604) Weight change:  Last BM Date: 06/06/12  Intake/Output from previous day: 08/26 0701 - 08/27 0700 In: 965.3 [P.O.:300; I.V.:665.3] Out: 1300 [Urine:1300]  PHYSICAL EXAM General appearance: alert and no distress Resp: clear to auscultation bilaterally Cardio: S1, S2 normal GI: soft, non-tender; bowel sounds normal; no masses,  no organomegaly Extremities: extremities normal, atraumatic, no cyanosis or edema  Lab Results:    @labtest @ [facility] No results found for this basename: PHART,PCO2,PO2ART,TCO2,HCO3 in the last 72 hours CULTURES Recent Results (from the past 240 hour(s))  URINE CULTURE     Status: Normal   Collection Time   06/01/12  2:34 PM      Component Value Range Status Comment   Specimen Description URINE, CLEAN CATCH   Final    Special Requests NONE   Final    Culture  Setup Time 06/02/2012 01:54   Final    Colony Count 2,000 COLONIES/ML   Final    Culture INSIGNIFICANT GROWTH   Final    Report Status 06/02/2012 FINAL   Final    Studies/Results: No results found.  Medications: I have reviewed the patient's current medications.  Assessment:  Active Problems:  CVA (cerebral infarction)  Atypical chest pain  Diabetes mellitus  Hypertension delirium - improving  Plan: Medications reviewed Discharge to nursing home as soon as placement is available. Continue supportive care.    LOS: 6 days   Kimbely Whiteaker 06/07/2012, 7:35 AM

## 2012-06-07 NOTE — Clinical Social Work Note (Signed)
Level 2 PASARR received, [facility] agreeable to patient admitting.  Will come to [facility] to transport to facility.  Discharge packet prepared and placed for facility to pick up.    Clovis Cao Clinical Social Worker [phone])

## 2012-06-07 NOTE — Progress Notes (Signed)
Patient received TB test to R anterior FA - circled with black marker.  T be read by nurse at [facility].  Report called and given to Darel Hong at facility.  Meds from home in blue bag at bedside to be transported with patient.  Patient stable to discharge when facility arrives to pick up.

## 2012-06-07 NOTE — Progress Notes (Signed)
Pts transportation arrived to transport pt back to ALF. Pt stable for discharge. Taken out via w/c by staff at this time.

## 2012-06-10 ENCOUNTER — Emergency Department (HOSPITAL_COMMUNITY)
Admission: EM | Admit: 2012-06-10 | Discharge: 2012-06-10 | Disposition: A | Discharge status: Home / Self Care | Payer: Medicaid Other | Attending: Emergency Medicine | Admitting: Emergency Medicine

## 2012-06-10 ENCOUNTER — Emergency Department (HOSPITAL_COMMUNITY): Payer: Medicaid Other

## 2012-06-10 ENCOUNTER — Encounter (HOSPITAL_COMMUNITY): Payer: Self-pay | Admitting: Emergency Medicine

## 2012-06-10 DIAGNOSIS — W19XXXA Unspecified fall, initial encounter: Secondary | ICD-10-CM | POA: Insufficient documentation

## 2012-06-10 DIAGNOSIS — F17200 Nicotine dependence, unspecified, uncomplicated: Secondary | ICD-10-CM | POA: Insufficient documentation

## 2012-06-10 DIAGNOSIS — E876 Hypokalemia: Secondary | ICD-10-CM

## 2012-06-10 DIAGNOSIS — S2239XA Fracture of one rib, unspecified side, initial encounter for closed fracture: Secondary | ICD-10-CM

## 2012-06-10 DIAGNOSIS — R0602 Shortness of breath: Secondary | ICD-10-CM | POA: Insufficient documentation

## 2012-06-10 DIAGNOSIS — E119 Type 2 diabetes mellitus without complications: Secondary | ICD-10-CM | POA: Insufficient documentation

## 2012-06-10 DIAGNOSIS — Z79899 Other long term (current) drug therapy: Secondary | ICD-10-CM | POA: Insufficient documentation

## 2012-06-10 DIAGNOSIS — Z8673 Personal history of transient ischemic attack (TIA), and cerebral infarction without residual deficits: Secondary | ICD-10-CM | POA: Insufficient documentation

## 2012-06-10 DIAGNOSIS — R0789 Other chest pain: Secondary | ICD-10-CM

## 2012-06-10 DIAGNOSIS — G8929 Other chronic pain: Secondary | ICD-10-CM | POA: Insufficient documentation

## 2012-06-10 DIAGNOSIS — S2249XA Multiple fractures of ribs, unspecified side, initial encounter for closed fracture: Secondary | ICD-10-CM | POA: Insufficient documentation

## 2012-06-10 DIAGNOSIS — I10 Essential (primary) hypertension: Secondary | ICD-10-CM | POA: Insufficient documentation

## 2012-06-10 LAB — CBC WITH DIFFERENTIAL/PLATELET
Basophils Relative: 1 % (ref 0–1)
Eosinophils Absolute: 0.1 10*3/uL (ref 0.0–0.7)
MCH: 31.3 pg (ref 26.0–34.0)
MCHC: 33.6 g/dL (ref 30.0–36.0)
Neutrophils Relative %: 61 % (ref 43–77)
Platelets: 243 10*3/uL (ref 150–400)
RDW: 14.4 % (ref 11.5–15.5)

## 2012-06-10 LAB — BASIC METABOLIC PANEL
GFR calc Af Amer: >90 mL/min (ref >90)
GFR calc non Af Amer: >90 mL/min (ref >90)
Potassium: 2.9 mEq/L — ABNORMAL LOW (ref 3.5–5.1)
Sodium: 142 mEq/L (ref 135–145)

## 2012-06-10 MED ORDER — HYDROCODONE-ACETAMINOPHEN 5-325 MG PO TABS: 1.0000 | ORAL_TABLET | Freq: Four times a day (QID) | ORAL | Status: AC | PRN

## 2012-06-10 MED ORDER — POTASSIUM CHLORIDE CRYS ER 20 MEQ PO TBCR
40.0000 meq | EXTENDED_RELEASE_TABLET | Freq: Once | ORAL | Status: AC
  Administered 2012-06-10: 40 meq via ORAL
  Filled 2012-06-10: qty 2

## 2012-06-10 MED ORDER — POTASSIUM CHLORIDE CRYS ER 20 MEQ PO TBCR: 20.0000 meq | EXTENDED_RELEASE_TABLET | Freq: Two times a day (BID) | ORAL | Status: DC

## 2012-06-10 MED ORDER — HYDROCODONE-ACETAMINOPHEN 5-325 MG PO TABS
1.0000 | ORAL_TABLET | Freq: Once | ORAL | Status: AC
  Administered 2012-06-10: 1 via ORAL
  Filled 2012-06-10: qty 1

## 2012-06-10 NOTE — ED Notes (Signed)
Patient does not need anything at this time. 

## 2012-06-10 NOTE — ED Notes (Signed)
[facility] notified of discharge. They stated someone would be here shortly to pick him up. Pt stated to me that he has not been receiving his pain meds at facility. I spoke with his nurse about this and she stated that he can have meds every 6 hours and for me to look at [facility]. I did look at [facility] but it is only signed off daily, not how often per day the patient has gotten the medications. Explained to pt that he needs to talk to administrator at facility if he is not getting his medication mas ordered.

## 2012-06-10 NOTE — ED Notes (Signed)
Pt presents via EMS from [facility], states he had some shortness of breath that woke him from sleep. Pt now denies SOB but does complain of dizziness. Also notes pain to L hip and L shoulder. Was seen 3 days ago for a fall at [facility]

## 2012-06-10 NOTE — ED Notes (Signed)
Pt states he woke up this morning with his nose stopped up and dizziness. States difficulty breathing. No distress noted. Pt states "they have been cleaning the floors and closed the door"

## 2012-06-10 NOTE — ED Provider Notes (Signed)
History    This chart was scribed for Shelda Jakes, MD, MD by Smitty Pluck. The patient was seen in room [facility]06 and the patient's care was started at 7:22AM.   CSN: [id number]  Arrival date & time 06/10/12  4098   None     Chief Complaint  Patient presents with  . Shortness of Breath    (Consider location/radiation/quality/duration/timing/severity/associated sxs/prior treatment) Patient is a 61 y.o. male presenting with shortness of breath. The history is provided by the patient.  Shortness of Breath  The current episode started today. The onset was sudden. The problem occurs frequently. The problem has been unchanged. The problem is moderate. Nothing relieves the symptoms. The symptoms are aggravated by activity. Associated symptoms include shortness of breath. There was no intake of a foreign body. He was not exposed to toxic fumes.   [name] is a 61 y.o. male who presents to the Emergency Department complaining of moderate SOB onset this AM and lateral left chest wall pain due to having broken ribs from fall 2 days ago. Pt reports that the SOB woke him up from his sleep. He reports that movement aggravates the pain. Pain is rated at 10/10. He uses O2 while he is sleeping. He reports having nausea. Pt denies abdominal pain, neck pain, back pain and vomiting.  Past Medical History  Diagnosis Date  . Diabetes mellitus   . Hypertension   . Stroke   . Hyperlipidemia   . Internal carotid artery occlusion     right  . Left carotid artery stenosis   . Peripheral neuropathy   . Chronic back pain   . Kidney stones     Past Surgical History  Procedure Date  . Fracture surgery     No family history on file.  History  Substance Use Topics  . Smoking status: Current Everyday Smoker -- 1.0 packs/day    Types: Cigarettes  . Smokeless tobacco: Not on file  . Alcohol Use: No      Review of Systems  Respiratory: Positive for shortness of breath.   All other systems  reviewed and are negative.  10 Systems reviewed and all are negative for acute change except as noted in the HPI.    Allergies  Other; Flomax; and Morphine and related  Home Medications   Current Outpatient Rx  Name Route Sig Dispense Refill  . ALPRAZOLAM ER 1 MG PO TB24 Oral Take 1 mg by mouth 5 (five) times daily. For anxiety    . CLOPIDOGREL BISULFATE 75 MG PO TABS Oral Take 75 mg by mouth daily.    Marland Kitchen DICLOFENAC SODIUM 1 % TD GEL Topical Apply 1 application topically 2 (two) times daily. Apply to left shoulder    . DILTIAZEM HCL ER COATED BEADS 120 MG PO CP24 Oral Take 120 mg by mouth daily.    Marland Kitchen DOCUSATE SODIUM 100 MG PO CAPS Oral Take 200 mg by mouth daily.    Marland Kitchen ESOMEPRAZOLE MAGNESIUM 40 MG PO CPDR Oral Take 40 mg by mouth daily before breakfast.    . FINASTERIDE 5 MG PO TABS Oral Take 5 mg by mouth daily.    . FUROSEMIDE 40 MG PO TABS Oral Take 40 mg by mouth daily.    Marland Kitchen GABAPENTIN 100 MG PO CAPS Oral Take 100 mg by mouth 3 (three) times daily.    Marland Kitchen HYDROCODONE-ACETAMINOPHEN 5-325 MG PO TABS Oral Take 1-2 tablets by mouth every 6 (six) hours as needed for pain. 10 tablet  0  . LORATADINE 10 MG PO TABS Oral Take 10 mg by mouth daily.    Marland Kitchen METFORMIN HCL 1000 MG PO TABS Oral Take 2,000 mg by mouth 2 (two) times daily with a meal.     . PAROXETINE HCL 20 MG PO TABS Oral Take 20 mg by mouth daily.    Marland Kitchen POTASSIUM CHLORIDE CRYS ER 20 MEQ PO TBCR Oral Take 1 tablet (20 mEq total) by mouth 2 (two) times daily. 10 tablet 0  . ROPINIROLE HCL 0.25 MG PO TABS Oral Take 0.25 mg by mouth at bedtime.    Marland Kitchen SIMVASTATIN 40 MG PO TABS Oral Take 40 mg by mouth every evening.    Marland Kitchen VITAMIN D (ERGOCALCIFEROL) 50000 UNITS PO CAPS Oral Take 50,000 Units by mouth every 7 (seven) days. Provider told patient he did not need to take in the summer      BP 124/80  Pulse 80  Temp 98.3 F (36.8 C)  Resp 20  Ht 5\' 9"  (1.753 m)  Wt 276 lb (125.193 kg)  BMI 40.76 kg/m2  SpO2 98%  Physical Exam  Nursing  note and vitals reviewed. Constitutional: He is oriented to person, place, and time. He appears well-developed and well-nourished. No distress.  HENT:  Head: Normocephalic and atraumatic.  Neck: Normal range of motion. Neck supple.  Cardiovascular: Normal rate, regular rhythm and normal heart sounds.   No murmur heard. Pulmonary/Chest: Effort normal and breath sounds normal. No respiratory distress. He has no wheezes. He has no rales.  Abdominal: Bowel sounds are normal. He exhibits no distension. There is no tenderness.  Musculoskeletal:       Walking boot on right leg and brace on left leg  Weakness in left arm due to previous stroke    Lymphadenopathy:    He has no cervical adenopathy.  Neurological: He is alert and oriented to person, place, and time.  Skin: Skin is warm and dry.  Psychiatric: He has a normal mood and affect. His behavior is normal.    ED Course  Procedures (including critical care time) DIAGNOSTIC STUDIES: Oxygen Saturation is 97% on room air, normal by my interpretation.    COORDINATION OF CARE: 8:45AM Ordered:   Medications  HYDROcodone-acetaminophen (NORCO/VICODIN) 5-325 MG per tablet 1 tablet (not administered)  HYDROcodone-acetaminophen (NORCO/VICODIN) 5-325 MG per tablet (not administered)  potassium chloride SA (K-DUR,KLOR-CON) 20 MEQ tablet (not administered)  potassium chloride SA (K-DUR,KLOR-CON) CR tablet 40 mEq (40 mEq Oral Given 06/10/12 0851)      Labs Reviewed  CBC WITH DIFFERENTIAL - Abnormal; Notable for the following:    WBC 10.8 (*)     RBC 4.19 (*)     All other components within normal limits  BASIC METABOLIC PANEL - Abnormal; Notable for the following:    Potassium 2.9 (*)     Glucose, Bld 137 (*)     All other components within normal limits  TROPONIN I   Dg Ribs Unilateral W/chest Left  06/10/2012  *RADIOLOGY REPORT*  Clinical Data: Shortness of breath, fall, last chest pain  LEFT RIBS AND CHEST - 3+ VIEW  Comparison:  06/01/2012  Findings: Lungs are essentially clear. No pleural effusion or pneumothorax.  Cardiomediastinal silhouette is within normal limits.  Minimally displaced left posterior 6th and lateral 9th rib fractures.  IMPRESSION: Minimally displaced left posterior 6th and lateral 9th rib fractures.   Original Report Authenticated By: Charline Bills, M.D.     Date: 06/10/2012  Rate: 93  Rhythm: normal  sinus rhythm and premature atrial contractions (PAC)  QRS Axis: normal  Intervals: normal  ST/T Wave abnormalities: nonspecific ST/T changes  Conduction Disutrbances:none  Narrative Interpretation:   Old EKG Reviewed: none available  Results for orders placed during the hospital encounter of 06/10/12  CBC WITH DIFFERENTIAL      Component Value Range   WBC 10.8 (*) 4.0 - 10.5 K/uL   RBC 4.19 (*) 4.22 - 5.81 MIL/uL   Hemoglobin 13.1  13.0 - 17.0 g/dL   HCT 16.1  09.6 - 04.5 %   MCV 93.1  78.0 - 100.0 fL   MCH 31.3  26.0 - 34.0 pg   MCHC 33.6  30.0 - 36.0 g/dL   RDW 40.9  81.1 - 91.4 %   Platelets 243  150 - 400 K/uL   Neutrophils Relative 61  43 - 77 %   Neutro Abs 6.5  1.7 - 7.7 K/uL   Lymphocytes Relative 29  12 - 46 %   Lymphs Abs 3.1  0.7 - 4.0 K/uL   Monocytes Relative 9  3 - 12 %   Monocytes Absolute 1.0  0.1 - 1.0 K/uL   Eosinophils Relative 1  0 - 5 %   Eosinophils Absolute 0.1  0.0 - 0.7 K/uL   Basophils Relative 1  0 - 1 %   Basophils Absolute 0.1  0.0 - 0.1 K/uL  BASIC METABOLIC PANEL      Component Value Range   Sodium 142  135 - 145 mEq/L   Potassium 2.9 (*) 3.5 - 5.1 mEq/L   Chloride 106  96 - 112 mEq/L   CO2 25  19 - 32 mEq/L   Glucose, Bld 137 (*) 70 - 99 mg/dL   BUN 11  6 - 23 mg/dL   Creatinine, Ser 7.82  0.50 - 1.35 mg/dL   Calcium 9.0  8.4 - 95.6 mg/dL   GFR calc non Af Amer >90  >90 mL/min   GFR calc Af Amer >90  >90 mL/min  TROPONIN I      Component Value Range   Troponin I <0.30  <0.30 ng/mL     1. Rib fractures   2. Hypokalemia   3. Chest wall  pain       MDM  Patient's hypokalemia treated in the emergency department with 40 mEq of potassium orally. Patient does not have any renal insufficiencies. Patient's chest pain is most likely related to the left-sided rib fracture from his fall a few days ago. EKG without acute changes chest x-ray without evidence of pneumothorax or pneumonia troponin was negative. Patient's pain is located where the rib fractures are. Patient states he has not been receiving any pain medicine. Patient is in no acute distress nontoxic oxygen saturations have been in the upper 90s.   I personally performed the services described in this documentation, which was scribed in my presence. The recorded information has been reviewed and considered.        Shelda Jakes, MD 06/10/12 [phone]

## 2012-06-10 NOTE — ED Notes (Signed)
Patient states he does not need anything at this time. 

## 2012-06-20 ENCOUNTER — Emergency Department (HOSPITAL_COMMUNITY): Payer: Medicaid Other

## 2012-06-20 ENCOUNTER — Encounter (HOSPITAL_COMMUNITY): Payer: Self-pay | Admitting: Emergency Medicine

## 2012-06-20 ENCOUNTER — Emergency Department (HOSPITAL_COMMUNITY)
Admission: EM | Admit: 2012-06-20 | Discharge: 2012-06-20 | Disposition: A | Discharge status: Home / Self Care | Payer: Medicaid Other | Attending: Emergency Medicine | Admitting: Emergency Medicine

## 2012-06-20 DIAGNOSIS — Z888 Allergy status to other drugs, medicaments and biological substances status: Secondary | ICD-10-CM | POA: Insufficient documentation

## 2012-06-20 DIAGNOSIS — I10 Essential (primary) hypertension: Secondary | ICD-10-CM | POA: Insufficient documentation

## 2012-06-20 DIAGNOSIS — Z794 Long term (current) use of insulin: Secondary | ICD-10-CM | POA: Insufficient documentation

## 2012-06-20 DIAGNOSIS — M25559 Pain in unspecified hip: Secondary | ICD-10-CM | POA: Insufficient documentation

## 2012-06-20 DIAGNOSIS — G8929 Other chronic pain: Secondary | ICD-10-CM | POA: Insufficient documentation

## 2012-06-20 DIAGNOSIS — M549 Dorsalgia, unspecified: Secondary | ICD-10-CM | POA: Insufficient documentation

## 2012-06-20 DIAGNOSIS — M25552 Pain in left hip: Secondary | ICD-10-CM

## 2012-06-20 DIAGNOSIS — Z8673 Personal history of transient ischemic attack (TIA), and cerebral infarction without residual deficits: Secondary | ICD-10-CM | POA: Insufficient documentation

## 2012-06-20 DIAGNOSIS — Z885 Allergy status to narcotic agent status: Secondary | ICD-10-CM | POA: Insufficient documentation

## 2012-06-20 DIAGNOSIS — E119 Type 2 diabetes mellitus without complications: Secondary | ICD-10-CM | POA: Insufficient documentation

## 2012-06-20 DIAGNOSIS — F17200 Nicotine dependence, unspecified, uncomplicated: Secondary | ICD-10-CM | POA: Insufficient documentation

## 2012-06-20 MED ORDER — HYDROCODONE-ACETAMINOPHEN 5-325 MG PO TABS
2.0000 | ORAL_TABLET | Freq: Once | ORAL | Status: AC
  Administered 2012-06-20: 2 via ORAL
  Filled 2012-06-20: qty 2

## 2012-06-20 MED ORDER — HYDROCODONE-ACETAMINOPHEN 5-325 MG PO TABS: 1.0000 | ORAL_TABLET | ORAL | Status: AC | PRN

## 2012-06-20 NOTE — ED Notes (Signed)
Per EMS, pt woke up with lower back pain, no fall. Previous stroke with left sided weakness.

## 2012-06-20 NOTE — ED Provider Notes (Signed)
History   This chart was scribed for [name] Hutching, MD by Gerlean Ren. This patient was seen in room APA09/APA09 and the patient's care was started at 12:14PM.   CSN: [id number]  Arrival date & time 06/20/12  4098   First MD Initiated Contact with Patient 06/20/12 1117      Chief Complaint  Patient presents with  . Back Pain    (Consider location/radiation/quality/duration/timing/severity/associated sxs/prior treatment) Patient is a 61 y.o. male presenting with back pain. The history is provided by the patient. No language interpreter was used.  Back Pain   [name] is a 61 y.o. male who presents to the Emergency Department complaining of sudden onset, gradually worsening, constant, non-radiating left hip pain beginning this morning without any reported fall or injury.  Pt has h/o DM, CVA, HTN, and chronic back pain.  Pt is a current everyday smoker (1pack/day) but denies alcohol use.   Past Medical History  Diagnosis Date  . Diabetes mellitus   . Hypertension   . Stroke   . Hyperlipidemia   . Internal carotid artery occlusion     right  . Left carotid artery stenosis   . Peripheral neuropathy   . Chronic back pain   . Kidney stones     Past Surgical History  Procedure Date  . Fracture surgery     History reviewed. No pertinent family history.  History  Substance Use Topics  . Smoking status: Current Everyday Smoker -- 1.0 packs/day    Types: Cigarettes  . Smokeless tobacco: Not on file  . Alcohol Use: No      Review of Systems  Musculoskeletal: Positive for back pain.  All other systems reviewed and are negative.    Allergies  Other; Flomax; and Morphine and related  Home Medications   Current Outpatient Rx  Name Route Sig Dispense Refill  . CLOPIDOGREL BISULFATE 75 MG PO TABS Oral Take 75 mg by mouth daily.    Marland Kitchen DICLOFENAC SODIUM 1 % TD GEL Topical Apply 1 application topically 2 (two) times daily. Apply to left shoulder    . DILTIAZEM HCL ER  COATED BEADS 120 MG PO CP24 Oral Take 120 mg by mouth daily.    Marland Kitchen DOCUSATE SODIUM 100 MG PO CAPS Oral Take 200 mg by mouth daily.    Marland Kitchen ESOMEPRAZOLE MAGNESIUM 40 MG PO CPDR Oral Take 40 mg by mouth daily before breakfast.    . FINASTERIDE 5 MG PO TABS Oral Take 5 mg by mouth daily.    . FUROSEMIDE 40 MG PO TABS Oral Take 40 mg by mouth daily.    Marland Kitchen GABAPENTIN 100 MG PO CAPS Oral Take 100 mg by mouth 3 (three) times daily.    Marland Kitchen HYDROCODONE-ACETAMINOPHEN 5-325 MG PO TABS Oral Take 1-2 tablets by mouth every 6 (six) hours as needed for pain. 10 tablet 0  . INSULIN ASPART 100 UNIT/ML [place] SOLN Subcutaneous Inject 10 Units into the skin 3 (three) times daily before meals.    . INSULIN GLARGINE 100 UNIT/ML [place] SOLN Subcutaneous Inject 30 Units into the skin at bedtime.    Marland Kitchen LORATADINE 10 MG PO TABS Oral Take 10 mg by mouth daily.    Marland Kitchen METFORMIN HCL 1000 MG PO TABS Oral Take 2,000 mg by mouth 2 (two) times daily with a meal.     . PAROXETINE HCL 20 MG PO TABS Oral Take 20 mg by mouth daily.    Marland Kitchen POTASSIUM CHLORIDE CRYS ER 20 MEQ PO TBCR  Oral Take 1 tablet (20 mEq total) by mouth 2 (two) times daily. 10 tablet 0  . ROPINIROLE HCL 0.25 MG PO TABS Oral Take 0.25 mg by mouth at bedtime.    Marland Kitchen SIMVASTATIN 40 MG PO TABS Oral Take 40 mg by mouth every evening.    Marland Kitchen VITAMIN D (ERGOCALCIFEROL) 50000 UNITS PO CAPS Oral Take 50,000 Units by mouth every 7 (seven) days. Takes on Monday      BP 131/81  Pulse 94  Temp 97.8 F (36.6 C) (Oral)  Resp 20  Ht 5\' 9"  (1.753 m)  Wt 276 lb (125.193 kg)  BMI 40.76 kg/m2  SpO2 96%  Physical Exam  Nursing note and vitals reviewed. Constitutional: He is oriented to person, place, and time. He appears well-developed and well-nourished.  HENT:  Head: Normocephalic and atraumatic.  Eyes: Conjunctivae and EOM are normal. Pupils are equal, round, and reactive to light.  Neck: Normal range of motion. Neck supple.  Cardiovascular: Normal rate, regular rhythm and normal heart  sounds.   Pulmonary/Chest: Effort normal and breath sounds normal.  Abdominal: Soft. Bowel sounds are normal.  Musculoskeletal: Normal range of motion.       Left lateral hip tenderness to palpation.   Left hip pain worsened when raising leg.  Neurological: He is alert and oriented to person, place, and time.  Skin: Skin is warm and dry.  Psychiatric: He has a normal mood and affect.    ED Course  Procedures (including critical care time) DIAGNOSTIC STUDIES: Oxygen Saturation is 96% on room air, adequate by my interpretation.    COORDINATION OF CARE: 11:33AM- ORdered left hip XR and pain meds.     Labs Reviewed - No data to display No results found.   No diagnosis found.  Dg Hip Complete Left  06/20/2012  *RADIOLOGY REPORT*  Clinical Data: Left hip pain.  No known injury.  LEFT HIP - COMPLETE 2+ VIEW  Comparison: None.  Findings: Mild symmetric degenerative changes in the hips bilaterally with early spurring. No acute bony abnormality. Specifically, no fracture, subluxation, or dislocation.  Soft tissues are intact.  Vascular calcifications noted.  IMPRESSION: No acute bony abnormality.   Original Report Authenticated By: Cyndie Chime, M.D.      MDM  X-ray of left hip shows no fracture. Patient is not normally ambulatory. Can follow up with primary care Dr.     I personally performed the services described in this documentation, which was scribed in my presence. The recorded information has been reviewed and considered.   [name] Hutching, MD 06/20/12 1425

## 2012-06-20 NOTE — ED Notes (Signed)
In to d/c. Pt states does not have any pain meds at [facility]. Called PF to check if any pain pills left. States they will call me back. Advised to also send ride due to pt being d/c.

## 2012-06-20 NOTE — ED Notes (Signed)
Upon getting out of bed this morning, patient reports he "got a catch in my back".  He was able to wash off and get dressed, but then had to lie back down d/t pain.

## 2012-06-20 NOTE — ED Notes (Signed)
Pt only had 2 hydrocodones left per staff [place].

## 2012-09-28 ENCOUNTER — Encounter: Payer: Self-pay | Admitting: Vascular Surgery

## 2012-10-26 ENCOUNTER — Encounter: Payer: Self-pay | Admitting: Neurosurgery

## 2012-10-27 ENCOUNTER — Ambulatory Visit (INDEPENDENT_AMBULATORY_CARE_PROVIDER_SITE_OTHER): Payer: Medicaid Other | Admitting: Neurosurgery

## 2012-10-27 ENCOUNTER — Encounter: Payer: Self-pay | Admitting: Neurosurgery

## 2012-10-27 ENCOUNTER — Ambulatory Visit (INDEPENDENT_AMBULATORY_CARE_PROVIDER_SITE_OTHER): Payer: Medicaid Other | Admitting: *Deleted

## 2012-10-27 VITALS — BP 130/67 | HR 63 | Resp 16 | Ht 66.0 in | Wt 246.0 lb

## 2012-10-27 DIAGNOSIS — I6529 Occlusion and stenosis of unspecified carotid artery: Secondary | ICD-10-CM

## 2012-10-27 NOTE — Progress Notes (Signed)
[facility] Carotid Office Note  CC: Carotid surveillance Referring Physician: Edilia Bo  History of Present Illness: 62 year old male patient of Dr. Edilia Bo followed for left carotid stenosis with a known right ICA occlusion. The patient denies any signs or symptoms of CVA, TIA, amaurosis fugax or patient has had a previous right hemispheric stroke with significant residual left-sided weakness. The patient denies any new CVA or recent medical problems.  Past Medical History  Diagnosis Date  . Diabetes mellitus   . Hypertension   . Stroke   . Hyperlipidemia   . Internal carotid artery occlusion     right  . Left carotid artery stenosis   . Peripheral neuropathy   . Chronic back pain   . Kidney stones     ROS: [x]  Positive   [ ]  Denies    General: [ ]  Weight loss, [ ]  Fever, [ ]  chills Neurologic: [ ]  Dizziness, [ ]  Blackouts, [ ]  Seizure [ ]  Stroke, [ ]  "Mini stroke", [ ]  Slurred speech, [ ]  Temporary blindness; [ ]  weakness in arms or legs, [ ]  Hoarseness Cardiac: [ ]  Chest pain/pressure, [ ]  Shortness of breath at rest [ ]  Shortness of breath with exertion, [ ]  Atrial fibrillation or irregular heartbeat Vascular: [ ]  Pain in legs with walking, [ ]  Pain in legs at rest, [ ]  Pain in legs at night,  [ ]  Non-healing ulcer, [ ]  Blood clot in vein/DVT,   Pulmonary: [ ]  Home oxygen, [ ]  Productive cough, [ ]  Coughing up blood, [ ]  Asthma,  [ ]  Wheezing Musculoskeletal:  [ ]  Arthritis, [ ]  Low back pain, [ ]  Joint pain Hematologic: [ ]  Easy Bruising, [ ]  Anemia; [ ]  Hepatitis Gastrointestinal: [ ]  Blood in stool, [ ]  Gastroesophageal Reflux/heartburn, [ ]  Trouble swallowing Urinary: [ ]  chronic Kidney disease, [ ]  on HD - [ ]  MWF or [ ]  TTHS, [ ]  Burning with urination, [ ]  Difficulty urinating Skin: [ ]  Rashes, [ ]  Wounds Psychological: [ ]  Anxiety, [ ]  Depression   Social History History  Substance Use Topics  . Smoking status: Current Every Day  Smoker -- 1.0 packs/day    Types: Cigarettes  . Smokeless tobacco: Never Used  . Alcohol Use: No    Family History History reviewed. No pertinent family history.  Allergies  Allergen Reactions  . Other Anaphylaxis    Muscle relaxers  . Flomax (Tamsulosin Hcl) Other (See Comments)    Patient cant remember  . Morphine And Related Itching    Current Outpatient Prescriptions  Medication Sig Dispense Refill  . clopidogrel (PLAVIX) 75 MG tablet Take 75 mg by mouth daily.      . diclofenac sodium (VOLTAREN) 1 % GEL Apply 1 application topically 2 (two) times daily. Apply to left shoulder      . diltiazem (CARDIZEM CD) 120 MG 24 hr capsule Take 120 mg by mouth daily.      Marland Kitchen docusate sodium (COLACE) 100 MG capsule Take 200 mg by mouth daily.      Marland Kitchen esomeprazole (NEXIUM) 40 MG capsule Take 40 mg by mouth daily before breakfast.      . finasteride (PROSCAR) 5 MG tablet Take 5 mg by mouth daily.      . furosemide (LASIX) 40 MG tablet Take 40 mg by mouth daily.      Marland Kitchen gabapentin (NEURONTIN) 100 MG capsule Take 100 mg by mouth 3 (three) times daily.      Marland Kitchen  Hydrocodone-Acetaminophen 10-325 MG/15ML SOLN Take 5 mg by mouth.      . insulin aspart (NOVOLOG) 100 UNIT/ML injection Inject 10 Units into the skin 3 (three) times daily before meals.      . insulin detemir (LEVEMIR FLEXPEN) 100 UNIT/ML injection Inject 100 Units into the skin at bedtime.      . insulin glargine (LANTUS) 100 UNIT/ML injection Inject 30 Units into the skin at bedtime.      Marland Kitchen loratadine (CLARITIN) 10 MG tablet Take 10 mg by mouth daily.      . metFORMIN (GLUCOPHAGE) 1000 MG tablet Take 2,000 mg by mouth 2 (two) times daily with a meal.       . omeprazole (PRILOSEC) 40 MG capsule Take 40 mg by mouth daily.      Marland Kitchen PARoxetine (PAXIL) 20 MG tablet Take 20 mg by mouth daily.      . potassium chloride SA (K-DUR,KLOR-CON) 20 MEQ tablet Take 1 tablet (20 mEq total) by mouth 2 (two) times daily.  10 tablet  0  . rOPINIRole (REQUIP)  0.25 MG tablet Take 0.25 mg by mouth at bedtime.      . simvastatin (ZOCOR) 40 MG tablet Take 40 mg by mouth every evening.      . Vitamin D, Ergocalciferol, (DRISDOL) 50000 UNITS CAPS Take 50,000 Units by mouth every 7 (seven) days. Takes on Monday        Physical Examination  Filed Vitals:   10/27/12 1233  BP: 130/67  Pulse: 63  Resp: 16    Body mass index is 39.71 kg/(m^2).  General:  WDWN in NAD Gait: Normal HEENT: WNL Eyes: Pupils equal Pulmonary: normal non-labored breathing , without Rales, rhonchi,  wheezing Cardiac: RRR, without  Murmurs, rubs or gallops; Abdomen: soft, NT, no masses Skin: no rashes, ulcers noted  Vascular Exam Pulses: 2+ right radial pulse Carotid bruits: Left carotid pulse to auscultation with a known right occlusion Extremities without ischemic changes, no Gangrene , no cellulitis; no open wounds;  Musculoskeletal: no muscle wasting or atrophy   Neurologic: A&O X 3; Appropriate Affect ; SENSATION: normal; MOTOR FUNCTION:  moving all extremities equally. Speech is fluent/normal  Non-Invasive Vascular Imaging CAROTID DUPLEX 10/27/2012  Right ICA 0ccluded stenosis Left ICA 20 - 39 % stenosis   ASSESSMENT/PLAN: Unchanged carotid duplex from previous exam, the patient will followup in one year with repeat carotid duplex. The patient's questions were encouraged and answered, he is in agreement with this plan.  Lauree Chandler ANP   Clinic MD: Darrick Penna

## 2012-10-28 NOTE — Addendum Note (Signed)
Addended by: Sharee Pimple on: 10/28/2012 08:20 AM   Modules accepted: Orders

## 2012-12-12 DIAGNOSIS — I219 Acute myocardial infarction, unspecified: Secondary | ICD-10-CM

## 2012-12-12 HISTORY — DX: Acute myocardial infarction, unspecified: I21.9

## 2013-01-02 ENCOUNTER — Encounter: Payer: Self-pay | Admitting: Physician Assistant

## 2013-01-02 ENCOUNTER — Inpatient Hospital Stay (HOSPITAL_COMMUNITY)
Admission: AD | Admit: 2013-01-02 | Discharge: 2013-01-19 | DRG: 234 | Disposition: A | Discharge status: Skilled Nursing Facility | Payer: Medicaid Other | Source: Other Acute Inpatient Hospital | Attending: Cardiothoracic Surgery | Admitting: Cardiothoracic Surgery

## 2013-01-02 DIAGNOSIS — I2589 Other forms of chronic ischemic heart disease: Secondary | ICD-10-CM | POA: Diagnosis present

## 2013-01-02 DIAGNOSIS — Y33XXXS Other specified events, undetermined intent, sequela: Secondary | ICD-10-CM

## 2013-01-02 DIAGNOSIS — I252 Old myocardial infarction: Secondary | ICD-10-CM | POA: Diagnosis present

## 2013-01-02 DIAGNOSIS — Z87820 Personal history of traumatic brain injury: Secondary | ICD-10-CM

## 2013-01-02 DIAGNOSIS — Z9181 History of falling: Secondary | ICD-10-CM

## 2013-01-02 DIAGNOSIS — Z888 Allergy status to other drugs, medicaments and biological substances status: Secondary | ICD-10-CM

## 2013-01-02 DIAGNOSIS — D62 Acute posthemorrhagic anemia: Secondary | ICD-10-CM | POA: Diagnosis not present

## 2013-01-02 DIAGNOSIS — Z6841 Body Mass Index (BMI) 40.0 and over, adult: Secondary | ICD-10-CM

## 2013-01-02 DIAGNOSIS — R509 Fever, unspecified: Secondary | ICD-10-CM | POA: Diagnosis not present

## 2013-01-02 DIAGNOSIS — E785 Hyperlipidemia, unspecified: Secondary | ICD-10-CM | POA: Diagnosis present

## 2013-01-02 DIAGNOSIS — I10 Essential (primary) hypertension: Secondary | ICD-10-CM

## 2013-01-02 DIAGNOSIS — S069X9S Unspecified intracranial injury with loss of consciousness of unspecified duration, sequela: Secondary | ICD-10-CM

## 2013-01-02 DIAGNOSIS — Z885 Allergy status to narcotic agent status: Secondary | ICD-10-CM

## 2013-01-02 DIAGNOSIS — L02419 Cutaneous abscess of limb, unspecified: Secondary | ICD-10-CM | POA: Diagnosis not present

## 2013-01-02 DIAGNOSIS — I517 Cardiomegaly: Secondary | ICD-10-CM | POA: Diagnosis present

## 2013-01-02 DIAGNOSIS — G4733 Obstructive sleep apnea (adult) (pediatric): Secondary | ICD-10-CM | POA: Diagnosis present

## 2013-01-02 DIAGNOSIS — I214 Non-ST elevation (NSTEMI) myocardial infarction: Principal | ICD-10-CM

## 2013-01-02 DIAGNOSIS — Z794 Long term (current) use of insulin: Secondary | ICD-10-CM

## 2013-01-02 DIAGNOSIS — M549 Dorsalgia, unspecified: Secondary | ICD-10-CM | POA: Diagnosis present

## 2013-01-02 DIAGNOSIS — I639 Cerebral infarction, unspecified: Secondary | ICD-10-CM

## 2013-01-02 DIAGNOSIS — Z8249 Family history of ischemic heart disease and other diseases of the circulatory system: Secondary | ICD-10-CM

## 2013-01-02 DIAGNOSIS — I519 Heart disease, unspecified: Secondary | ICD-10-CM | POA: Diagnosis present

## 2013-01-02 DIAGNOSIS — G8929 Other chronic pain: Secondary | ICD-10-CM | POA: Diagnosis present

## 2013-01-02 DIAGNOSIS — I2510 Atherosclerotic heart disease of native coronary artery without angina pectoris: Secondary | ICD-10-CM | POA: Diagnosis present

## 2013-01-02 DIAGNOSIS — R05 Cough: Secondary | ICD-10-CM | POA: Diagnosis not present

## 2013-01-02 DIAGNOSIS — E8779 Other fluid overload: Secondary | ICD-10-CM | POA: Diagnosis not present

## 2013-01-02 DIAGNOSIS — I4892 Unspecified atrial flutter: Secondary | ICD-10-CM | POA: Diagnosis present

## 2013-01-02 DIAGNOSIS — D696 Thrombocytopenia, unspecified: Secondary | ICD-10-CM | POA: Diagnosis not present

## 2013-01-02 DIAGNOSIS — Z79899 Other long term (current) drug therapy: Secondary | ICD-10-CM

## 2013-01-02 DIAGNOSIS — S069XAS Unspecified intracranial injury with loss of consciousness status unknown, sequela: Secondary | ICD-10-CM

## 2013-01-02 DIAGNOSIS — G609 Hereditary and idiopathic neuropathy, unspecified: Secondary | ICD-10-CM | POA: Diagnosis present

## 2013-01-02 DIAGNOSIS — I2582 Chronic total occlusion of coronary artery: Secondary | ICD-10-CM | POA: Diagnosis present

## 2013-01-02 DIAGNOSIS — F17200 Nicotine dependence, unspecified, uncomplicated: Secondary | ICD-10-CM | POA: Diagnosis present

## 2013-01-02 DIAGNOSIS — E1159 Type 2 diabetes mellitus with other circulatory complications: Secondary | ICD-10-CM | POA: Diagnosis present

## 2013-01-02 DIAGNOSIS — Z951 Presence of aortocoronary bypass graft: Secondary | ICD-10-CM

## 2013-01-02 DIAGNOSIS — R059 Cough, unspecified: Secondary | ICD-10-CM | POA: Diagnosis not present

## 2013-01-02 DIAGNOSIS — I779 Disorder of arteries and arterioles, unspecified: Secondary | ICD-10-CM | POA: Diagnosis present

## 2013-01-02 DIAGNOSIS — I498 Other specified cardiac arrhythmias: Secondary | ICD-10-CM | POA: Diagnosis not present

## 2013-01-02 DIAGNOSIS — I69959 Hemiplegia and hemiparesis following unspecified cerebrovascular disease affecting unspecified side: Secondary | ICD-10-CM

## 2013-01-02 DIAGNOSIS — Z833 Family history of diabetes mellitus: Secondary | ICD-10-CM

## 2013-01-02 DIAGNOSIS — E119 Type 2 diabetes mellitus without complications: Secondary | ICD-10-CM | POA: Diagnosis present

## 2013-01-02 HISTORY — DX: Traumatic subdural hemorrhage with loss of consciousness status unknown, initial encounter: S06.5XAA

## 2013-01-02 HISTORY — DX: Disorder of arteries and arterioles, unspecified: I77.9

## 2013-01-02 HISTORY — DX: Peripheral vascular disease, unspecified: I73.9

## 2013-01-02 HISTORY — DX: Unspecified intracranial injury with loss of consciousness status unknown, initial encounter: S06.9XAA

## 2013-01-02 HISTORY — DX: Fracture of unspecified parts of lumbosacral spine and pelvis, initial encounter for closed fracture: S32.9XXA

## 2013-01-02 HISTORY — DX: Unspecified intracranial injury with loss of consciousness of unspecified duration, initial encounter: S06.9X9A

## 2013-01-02 HISTORY — DX: Traumatic subdural hemorrhage with loss of consciousness of unspecified duration, initial encounter: S06.5X9A

## 2013-01-02 HISTORY — DX: Obstructive sleep apnea (adult) (pediatric): G47.33

## 2013-01-02 LAB — LIPID PANEL
HDL: 49 mg/dL (ref >39)
LDL Cholesterol: 120 mg/dL — ABNORMAL HIGH (ref 0–99)

## 2013-01-02 LAB — TROPONIN I
Troponin I: 12.62 ng/mL (ref <0.30)
Troponin I: >20 ng/mL (ref <0.30)

## 2013-01-02 LAB — HEPARIN LEVEL (UNFRACTIONATED): Heparin Unfractionated: 0.14 [IU]/mL — ABNORMAL LOW (ref 0.30–0.70)

## 2013-01-02 LAB — GLUCOSE, CAPILLARY
Glucose-Capillary: 96 mg/dL (ref 70–99)
Glucose-Capillary: 148 mg/dL — ABNORMAL HIGH (ref 70–99)

## 2013-01-02 LAB — CBC
Hemoglobin: 12.7 g/dL — ABNORMAL LOW (ref 13.0–17.0)
MCH: 30 pg (ref 26.0–34.0)
RBC: 4.24 MIL/uL (ref 4.22–5.81)
WBC: 10.9 10*3/uL — ABNORMAL HIGH (ref 4.0–10.5)

## 2013-01-02 LAB — HEMOGLOBIN A1C: Hgb A1c MFr Bld: 6.7 % — ABNORMAL HIGH (ref <5.7)

## 2013-01-02 MED ORDER — DILTIAZEM HCL 100 MG IV SOLR
5.0000 mg/h | INTRAVENOUS | Status: DC
  Administered 2013-01-02: 5 mg/h via INTRAVENOUS
  Filled 2013-01-02: qty 100

## 2013-01-02 MED ORDER — PAROXETINE HCL 20 MG PO TABS
20.0000 mg | ORAL_TABLET | Freq: Every day | ORAL | Status: DC
  Administered 2013-01-02 – 2013-01-19 (×17): 20 mg via ORAL
  Filled 2013-01-02 (×18): qty 1

## 2013-01-02 MED ORDER — METOPROLOL TARTRATE 25 MG PO TABS
25.0000 mg | ORAL_TABLET | Freq: Two times a day (BID) | ORAL | Status: DC
  Administered 2013-01-02 – 2013-01-05 (×8): 25 mg via ORAL
  Filled 2013-01-02 (×10): qty 1

## 2013-01-02 MED ORDER — POTASSIUM CHLORIDE CRYS ER 20 MEQ PO TBCR
20.0000 meq | EXTENDED_RELEASE_TABLET | Freq: Every day | ORAL | Status: DC
  Administered 2013-01-02 – 2013-01-08 (×7): 20 meq via ORAL
  Filled 2013-01-02 (×8): qty 1

## 2013-01-02 MED ORDER — GABAPENTIN 100 MG PO CAPS
100.0000 mg | ORAL_CAPSULE | Freq: Three times a day (TID) | ORAL | Status: DC
  Administered 2013-01-02 – 2013-01-19 (×48): 100 mg via ORAL
  Filled 2013-01-02 (×54): qty 1

## 2013-01-02 MED ORDER — ATORVASTATIN CALCIUM 20 MG PO TABS
20.0000 mg | ORAL_TABLET | Freq: Every day | ORAL | Status: DC
  Administered 2013-01-02: 20 mg via ORAL
  Filled 2013-01-02 (×2): qty 1

## 2013-01-02 MED ORDER — NITROGLYCERIN 0.4 MG SL SUBL: 0.4000 mg | SUBLINGUAL_TABLET | SUBLINGUAL | Status: DC | PRN

## 2013-01-02 MED ORDER — SODIUM CHLORIDE 0.9 % IJ SOLN: 3.0000 mL | Freq: Two times a day (BID) | INTRAMUSCULAR | Status: DC

## 2013-01-02 MED ORDER — ALPRAZOLAM 0.25 MG PO TABS
0.2500 mg | ORAL_TABLET | Freq: Two times a day (BID) | ORAL | Status: DC | PRN
  Administered 2013-01-05 – 2013-01-08 (×4): 0.25 mg via ORAL
  Filled 2013-01-02 (×4): qty 1

## 2013-01-02 MED ORDER — SODIUM CHLORIDE 0.9 % IJ SOLN
3.0000 mL | Freq: Two times a day (BID) | INTRAMUSCULAR | Status: DC
  Administered 2013-01-02 – 2013-01-08 (×4): 3 mL via INTRAVENOUS

## 2013-01-02 MED ORDER — ONDANSETRON HCL 4 MG/2ML IJ SOLN: 4.0000 mg | Freq: Four times a day (QID) | INTRAMUSCULAR | Status: DC | PRN

## 2013-01-02 MED ORDER — ACETAMINOPHEN 325 MG PO TABS: 650.0000 mg | ORAL_TABLET | ORAL | Status: DC | PRN

## 2013-01-02 MED ORDER — CLOPIDOGREL BISULFATE 75 MG PO TABS
75.0000 mg | ORAL_TABLET | Freq: Every day | ORAL | Status: DC
  Administered 2013-01-02: 75 mg via ORAL
  Filled 2013-01-02: qty 1

## 2013-01-02 MED ORDER — SODIUM CHLORIDE 0.9 % IV SOLN: 250.0000 mL | INTRAVENOUS | Status: DC | PRN

## 2013-01-02 MED ORDER — FINASTERIDE 5 MG PO TABS
5.0000 mg | ORAL_TABLET | Freq: Every day | ORAL | Status: DC
  Administered 2013-01-02 – 2013-01-19 (×17): 5 mg via ORAL
  Filled 2013-01-02 (×18): qty 1

## 2013-01-02 MED ORDER — ASPIRIN EC 81 MG PO TBEC
81.0000 mg | DELAYED_RELEASE_TABLET | Freq: Every day | ORAL | Status: DC
  Administered 2013-01-04 – 2013-01-08 (×4): 81 mg via ORAL
  Filled 2013-01-02 (×6): qty 1

## 2013-01-02 MED ORDER — HYDROCODONE-ACETAMINOPHEN 5-325 MG PO TABS
1.0000 | ORAL_TABLET | Freq: Four times a day (QID) | ORAL | Status: DC | PRN
  Administered 2013-01-02 – 2013-01-08 (×18): 2 via ORAL
  Filled 2013-01-02 (×19): qty 2

## 2013-01-02 MED ORDER — HEPARIN BOLUS VIA INFUSION
2500.0000 [IU] | Freq: Once | INTRAVENOUS | Status: AC
  Administered 2013-01-02: 2500 [IU] via INTRAVENOUS
  Filled 2013-01-02: qty 2500

## 2013-01-02 MED ORDER — LORATADINE 10 MG PO TABS
10.0000 mg | ORAL_TABLET | Freq: Every day | ORAL | Status: DC
  Administered 2013-01-02 – 2013-01-19 (×17): 10 mg via ORAL
  Filled 2013-01-02 (×18): qty 1

## 2013-01-02 MED ORDER — SODIUM CHLORIDE 0.9 % IJ SOLN: 3.0000 mL | INTRAMUSCULAR | Status: DC | PRN

## 2013-01-02 MED ORDER — HEPARIN (PORCINE) IN NACL 100-0.45 UNIT/ML-% IJ SOLN
1500.0000 [IU]/h | INTRAMUSCULAR | Status: DC
  Administered 2013-01-02 (×2): 1200 [IU]/h via INTRAVENOUS
  Filled 2013-01-02 (×2): qty 250

## 2013-01-02 MED ORDER — ASPIRIN 81 MG PO CHEW
324.0000 mg | CHEWABLE_TABLET | ORAL | Status: AC
  Administered 2013-01-03: 324 mg via ORAL
  Filled 2013-01-02: qty 4

## 2013-01-02 MED ORDER — DOCUSATE SODIUM 100 MG PO CAPS
200.0000 mg | ORAL_CAPSULE | Freq: Every day | ORAL | Status: DC
  Administered 2013-01-02 – 2013-01-08 (×5): 200 mg via ORAL
  Filled 2013-01-02 (×8): qty 2

## 2013-01-02 MED ORDER — PANTOPRAZOLE SODIUM 40 MG PO TBEC
40.0000 mg | DELAYED_RELEASE_TABLET | Freq: Every day | ORAL | Status: DC
  Administered 2013-01-02 – 2013-01-08 (×7): 40 mg via ORAL
  Filled 2013-01-02 (×6): qty 1

## 2013-01-02 MED ORDER — INSULIN ASPART 100 UNIT/ML ~~LOC~~ SOLN
0.0000 [IU] | Freq: Three times a day (TID) | SUBCUTANEOUS | Status: DC
  Administered 2013-01-02 – 2013-01-07 (×3): 2 [IU] via SUBCUTANEOUS

## 2013-01-02 MED ORDER — DIAZEPAM 5 MG PO TABS
5.0000 mg | ORAL_TABLET | ORAL | Status: AC
  Administered 2013-01-03: 5 mg via ORAL
  Filled 2013-01-02: qty 1

## 2013-01-02 MED ORDER — ROPINIROLE HCL 0.25 MG PO TABS
0.2500 mg | ORAL_TABLET | Freq: Every day | ORAL | Status: DC
  Administered 2013-01-02 – 2013-01-18 (×16): 0.25 mg via ORAL
  Filled 2013-01-02 (×18): qty 1

## 2013-01-02 MED ORDER — SODIUM CHLORIDE 0.9 % IV SOLN
250.0000 mL | INTRAVENOUS | Status: DC | PRN
  Administered 2013-01-06: 250 mL via INTRAVENOUS

## 2013-01-02 MED ORDER — SIMVASTATIN 40 MG PO TABS: 40.0000 mg | ORAL_TABLET | Freq: Every evening | ORAL | Status: DC

## 2013-01-02 MED ORDER — DICLOFENAC SODIUM 1 % TD GEL
1.0000 "application " | Freq: Two times a day (BID) | TRANSDERMAL | Status: DC
  Administered 2013-01-02 – 2013-01-08 (×13): 1 via TOPICAL
  Filled 2013-01-02: qty 100

## 2013-01-02 MED FILL — Heparin Sodium (Porcine) 100 Unt/ML in Sodium Chloride 0.45%: INTRAMUSCULAR | Qty: 250 | Status: AC

## 2013-01-02 NOTE — Progress Notes (Addendum)
ANTICOAGULATION CONSULT NOTE - Initial Consult  Pharmacy Consult for heparin  Indication: chest pain/ACS  Allergies  Allergen Reactions  . Other Anaphylaxis    Muscle relaxers  . Flomax (Tamsulosin Hcl) Other (See Comments)    Patient cant remember  . Morphine And Related Itching    Patient Measurements: Height: 5\' 7"  (170.2 cm) Weight: 264 lb (119.75 kg) IBW/kg (Calculated) : 66.1 Heparin Dosing Weight: 93.8kg  Vital Signs: Temp: 97.6 F (36.4 C) (03/24 1357) Temp src: Oral (03/24 1357) BP: 104/54 mmHg (03/24 1357) Pulse Rate: 58 (03/24 1357)  Labs:  Recent Labs  01/02/13 1040 01/02/13 1641  HGB 12.7*  --   HCT 37.9*  --   PLT 187  --   HEPARINUNFRC  --  0.14*  TROPONINI 12.62*  --     Estimated Creatinine Clearance: 121.7 ml/min (by C-G formula based on Cr of 0.69).  Medications:  Heparin infusion 1200 units/hr  Assessment: 62 year old male transferred from [facility] for chest pain/ACS. Asa given and heparin started at [facility] ~0600. Initial heparin level is low at 0.14. No bleeding and no problems with the IV line per RN.   Goal of Therapy:  Heparin level 0.3-0.7 units/ml Monitor platelets by anticoagulation protocol: Yes   Plan:  1. Heparin bolus 2500 units IV x 1 2. Increase heparin gtt to 1500 units/hr 3. Check a 6 hour heparin level 4. Continue daily heparin level and CBC  Lysle Pearl, PharmD, BCPS Pager # [phone] 01/02/2013 5:31 PM   Addendum:  RN called back to say that after further investigation, pt stated that the heparin line was out and leaking. Pt does not know how long it was out. Will continue previous dosing since we don't know how long the heparin was out.   Plan: 1. Resume heparin gtt at 1200 units/hr 2. Check a 6 hour heparin level  Lysle Pearl, PharmD, BCPS Pager # [phone] 01/02/2013 5:46 PM

## 2013-01-02 NOTE — Progress Notes (Signed)
Loss of IV access during heparin rate increase and bolus.  PharmD notified for dosing instructions and IV team notified to obtain a new site.  Will continue to monitor. [place], Mitzi Hansen

## 2013-01-02 NOTE — Progress Notes (Signed)
Utilization review completed.  
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
## 2013-01-02 NOTE — Progress Notes (Signed)
Inpatient Diabetes Program Recommendations  AACE/ADA: New Consensus Statement on Inpatient Glycemic Control (2013)  Target Ranges:  Prepandial:   less than 140 mg/dL      Peak postprandial:   less than 180 mg/dL (1-2 hours)      Critically ill patients:  140 - 180 mg/dL    Received referral for this patient for assistance with clarification of home insulin regimen.  Called patient's endocrinologist Dr. Fransico Him in [place] to clarify patient's home insulin.  Per Dr. Fransico Him, patient was last seen in his office on 03/10.    Patient is supposed to be taking the following DM medications at home: Levemir 20 units QHS Metformin 1000 mg bid  Called PA, Hurman Horn to relay this information.  May want to add Levemir to patient's in-hospital regimen.   Will follow. Ambrose Finland RN, MSN, CDE Diabetes Coordinator Inpatient Diabetes Program [phone]

## 2013-01-02 NOTE — Progress Notes (Signed)
CRITICAL VALUE ALERT  Critical value received:  Trop 12.62  Date of notification:  01/02/2013  Time of notification:  1230  Critical value read back:yes  Nurse who received alert:  Prince Rome, RN   MD notified (1st page):  Si Gaul., PA  Time of first page:  1235  MD notified (2nd page):  Time of second page:  Responding MD:  Si Gaul., PA  Time MD responded:  [phone]

## 2013-01-03 ENCOUNTER — Other Ambulatory Visit: Payer: Self-pay | Admitting: *Deleted

## 2013-01-03 ENCOUNTER — Encounter (HOSPITAL_COMMUNITY)
Admission: AD | Disposition: A | Discharge status: Skilled Nursing Facility | Payer: Self-pay | Source: Other Acute Inpatient Hospital | Attending: Cardiothoracic Surgery

## 2013-01-03 ENCOUNTER — Observation Stay (HOSPITAL_COMMUNITY): Admit: 2013-01-03 | Payer: Self-pay | Admitting: Cardiovascular Disease

## 2013-01-03 ENCOUNTER — Inpatient Hospital Stay (HOSPITAL_COMMUNITY): Payer: Medicaid Other

## 2013-01-03 DIAGNOSIS — I2510 Atherosclerotic heart disease of native coronary artery without angina pectoris: Secondary | ICD-10-CM

## 2013-01-03 DIAGNOSIS — Z01810 Encounter for preprocedural cardiovascular examination: Secondary | ICD-10-CM

## 2013-01-03 HISTORY — PX: LEFT HEART CATHETERIZATION WITH CORONARY ANGIOGRAM: SHX5451

## 2013-01-03 LAB — GLUCOSE, CAPILLARY: Glucose-Capillary: 136 mg/dL — ABNORMAL HIGH (ref 70–99)

## 2013-01-03 LAB — BASIC METABOLIC PANEL
BUN: 18 mg/dL (ref 6–23)
Calcium: 8.9 mg/dL (ref 8.4–10.5)
Chloride: 105 mEq/L (ref 96–112)
Creatinine, Ser: 0.81 mg/dL (ref 0.50–1.35)
GFR calc Af Amer: >90 mL/min (ref >90)

## 2013-01-03 LAB — PROTIME-INR: Prothrombin Time: 13.4 seconds (ref 11.6–15.2)

## 2013-01-03 LAB — HEPARIN LEVEL (UNFRACTIONATED): Heparin Unfractionated: 0.16 IU/mL — ABNORMAL LOW (ref 0.30–0.70)

## 2013-01-03 SURGERY — LEFT HEART CATHETERIZATION WITH CORONARY ANGIOGRAM
Anesthesia: LOCAL

## 2013-01-03 SURGERY — Surgical Case
Anesthesia: *Unknown

## 2013-01-03 MED ORDER — DILTIAZEM HCL 30 MG PO TABS
30.0000 mg | ORAL_TABLET | Freq: Four times a day (QID) | ORAL | Status: DC
  Administered 2013-01-03: 30 mg via ORAL
  Filled 2013-01-03 (×8): qty 1

## 2013-01-03 MED ORDER — HEPARIN BOLUS VIA INFUSION
2000.0000 [IU] | Freq: Once | INTRAVENOUS | Status: AC
  Administered 2013-01-03: 2000 [IU] via INTRAVENOUS
  Filled 2013-01-03: qty 2000

## 2013-01-03 MED ORDER — MIDAZOLAM HCL 2 MG/2ML IJ SOLN
INTRAMUSCULAR | Status: AC
  Filled 2013-01-03: qty 2

## 2013-01-03 MED ORDER — FENTANYL CITRATE 0.05 MG/ML IJ SOLN
INTRAMUSCULAR | Status: AC
  Filled 2013-01-03: qty 2

## 2013-01-03 MED ORDER — HEPARIN (PORCINE) IN NACL 100-0.45 UNIT/ML-% IJ SOLN
1650.0000 [IU]/h | INTRAMUSCULAR | Status: DC
  Administered 2013-01-03: 1500 [IU]/h via INTRAVENOUS
  Administered 2013-01-05 – 2013-01-08 (×5): 1650 [IU]/h via INTRAVENOUS
  Filled 2013-01-03 (×13): qty 250

## 2013-01-03 MED ORDER — ATORVASTATIN CALCIUM 80 MG PO TABS
80.0000 mg | ORAL_TABLET | Freq: Every day | ORAL | Status: DC
  Administered 2013-01-03 – 2013-01-08 (×5): 80 mg via ORAL
  Filled 2013-01-03 (×7): qty 1

## 2013-01-03 MED ORDER — SODIUM CHLORIDE 0.9 % IV SOLN: INTRAVENOUS | Status: AC

## 2013-01-03 MED ORDER — HEPARIN (PORCINE) IN NACL 2-0.9 UNIT/ML-% IJ SOLN
INTRAMUSCULAR | Status: AC
  Filled 2013-01-03: qty 1000

## 2013-01-03 MED ORDER — ALBUTEROL SULFATE (5 MG/ML) 0.5% IN NEBU
2.5000 mg | INHALATION_SOLUTION | Freq: Once | RESPIRATORY_TRACT | Status: AC
  Administered 2013-01-03: 2.5 mg via RESPIRATORY_TRACT

## 2013-01-03 MED ORDER — LIDOCAINE HCL (PF) 1 % IJ SOLN
INTRAMUSCULAR | Status: AC
  Filled 2013-01-03: qty 30

## 2013-01-03 NOTE — Progress Notes (Signed)
ANTICOAGULATION CONSULT NOTE  Pharmacy Consult for heparin  Indication: chest pain/ACS  Patient Measurements: Height: 5\' 7"  (170.2 cm) Weight: 263 lb 6.4 oz (119.477 kg) IBW/kg (Calculated) : 66.1 Heparin Dosing Weight: 93.8kg  Vital Signs: Temp: 98.4 F (36.9 C) (03/25 2000) Temp src: Oral (03/25 2000) BP: 131/58 mmHg (03/25 2000) Pulse Rate: 56 (03/25 2000)  Labs:  Recent Labs  01/02/13 1040 01/02/13 1641 01/02/13 2341 01/03/13 0005 01/03/13 0435 01/03/13 2210  HGB 12.7*  --   --   --   --   --   HCT 37.9*  --   --   --   --   --   PLT 187  --   --   --   --   --   LABPROT  --   --   --   --  13.4  --   INR  --   --   --   --  1.03  --   HEPARINUNFRC  --  0.14*  --  0.16*  --  0.20*  CREATININE  --   --   --   --  0.81  --   TROPONINI 12.62* >20.00* 12.20*  --   --   --     Estimated Creatinine Clearance: 120 ml/min (by C-G formula based on Cr of 0.81).  Assessment: 62 year old male with 3v CAD s/p cath for heparin.  Goal of Therapy:  Heparin level 0.3-0.7 units/ml Monitor platelets by anticoagulation protocol: Yes   Plan:  Increase heparin 1650 units/hr Follow-up am labs.  Geannie Risen, PharmD, BCPS  01/03/2013 11:35 PM

## 2013-01-03 NOTE — Interval H&P Note (Signed)
History and Physical Interval Note:  01/03/2013 7:36 AM  [name]  has presented today for cardiac cath with the diagnosis of CHEST PAIN  The various methods of treatment have been discussed with the patient and family. After consideration of risks, benefits and other options for treatment, the patient has consented to  Procedure(s): LEFT HEART CATHETERIZATION WITH CORONARY ANGIOGRAM (N/A) as a surgical intervention .  The patient's history has been reviewed, patient examined, no change in status, stable for surgery.  I have reviewed the patient's chart and labs.  Questions were answered to the patient's satisfaction.     MCALHANY,CHRISTOPHER

## 2013-01-03 NOTE — Progress Notes (Signed)
ANTICOAGULATION CONSULT NOTE  Pharmacy Consult for heparin  Indication: chest pain/ACS  Allergies  Allergen Reactions  . Other Anaphylaxis    Muscle relaxers; back on Cyclobenzaprine since 12/21/12.  . Morphine And Related Itching    Patient Measurements: Height: 5\' 7"  (170.2 cm) Weight: 264 lb (119.75 kg) IBW/kg (Calculated) : 66.1 Heparin Dosing Weight: 93.8kg  Vital Signs: Temp: 98.5 F (36.9 C) (03/24 2134) Temp src: Oral (03/24 2134) BP: 122/61 mmHg (03/24 2134) Pulse Rate: 59 (03/24 2134)  Labs:  Recent Labs  01/02/13 1040 01/02/13 1641 01/02/13 2341 01/03/13 0005  HGB 12.7*  --   --   --   HCT 37.9*  --   --   --   PLT 187  --   --   --   HEPARINUNFRC  --  0.14*  --  0.16*  TROPONINI 12.62* >20.00* 12.20*  --     Estimated Creatinine Clearance: 121.7 ml/min (by C-G formula based on Cr of 0.69).  Assessment: 62 year old male with chest pain for heparin Goal of Therapy:  Heparin level 0.3-0.7 units/ml Monitor platelets by anticoagulation protocol: Yes   Plan:  Heparin 2000 units IV bolus, then increase heparin 1500 units/hr  F/U after cath  Geannie Risen, PharmD, BCPS

## 2013-01-03 NOTE — Progress Notes (Signed)
VASCULAR LAB PRELIMINARY  PRELIMINARY  PRELIMINARY  PRELIMINARY  Pre-op Cardiac Surgery  Carotid Findings:  Carotid completed at another facility. Cardiac office called to say new study not required. Results in EPIC  Upper Extremity Right Left  Brachial Pressures 149 Triphasic Unable to obtain due to severe contracture from previous stroke   Radial Waveforms Triphasic   Ulnar Waveforms Triphasic   Palmar Arch (Allen's Test) Normal    Findings:  Unable to evaluate left side due to severe contracture of the arm post stroke. The right Doppler waveforms remained normal with both radial and ulnar compressions.    Lower  Extremity Right Left  Dorsalis Pedis 113 Monophasic 74 Monophasic      Posterior Tibial 131 Monophasic 48 Monophasic  Ankle/Brachial Indices 0.88 0.50    Findings:  Right - ABI indicates a mild reduction in arterial flow with abnormal Doppler waveforms. Cannot rule out false elevation of pressures                   Left - ABI indicates a moderate to severe reduction in arterial flow. Difficult to evaluate the posterior tibial artery probably secondary to previous extensive surgery requiring medal plates and multiple screws.  Keylin Ferryman, RVS 01/03/2013, 4:12 PM

## 2013-01-03 NOTE — Progress Notes (Signed)
  Echocardiogram 2D Echocardiogram has been performed.  [name] 01/03/2013, 4:13 PM

## 2013-01-03 NOTE — CV Procedure (Addendum)
   Cardiac Catheterization Operative Report  [name] [id number] 3/25/20148:16 AM FANTA,TESFAYE, MD  Procedure Performed:  1. Left Heart Catheterization 2. Selective Coronary Angiography 3. Left ventricular angiogram  Operator: Verne Carrow, MD  Indication:  62 yo WM with history of obesity, DM, HTN, HLD, tobacco abuse and strong family history of CAD admitted with chest pain and found to have a NSTEMI (Troponin over 20). No current chest pain. Cardiac cath to define extent CAD.                                     Procedure Details: The risks, benefits, complications, treatment options, and expected outcomes were discussed with the patient. The patient and/or family concurred with the proposed plan, giving informed consent. The patient was brought to the cath lab after IV hydration was begun and oral premedication was given. The patient was further sedated with Versed and Fentanyl. The right groin was prepped and draped in the usual manner. Using the modified Seldinger access technique, a 5 French sheath was placed in the right femoral artery. Standard diagnostic catheters were used to perform selective coronary angiography. A pigtail catheter was used to perform a left ventricular angiogram.  There were no immediate complications. The patient was taken to the recovery area in stable condition.   Hemodynamic Findings: Central aortic pressure: 144/73 Left ventricular pressure: 135/18/28  Angiographic Findings:  Left main: Distal 70-80% stenosis.  Left Anterior Descending Artery: Large caliber vessel that courses to the apex. The proximal vessel has 40% stenosis. The mid vessel has a 90% stenosis just before a large septal perforating branch. The remainder of the vessel has mild diffuse disease. The diagonal is moderate in caliber with ostial 90% stenosis.   Circumflex Artery: Moderate to large caliber vessel with 100% proximal occlusion just after the takeoff of a large  caliber first obtuse marginal branch. The distal Circumflex and the second obtuse marginal branch fills from right to left collaterals. The first obtuse marginal/intermediate branch is moderate in caliber and has proximal 60% stenosis.   Right Coronary Artery: Large, dominant vessel with 100% proximal occlusion. The mid and distal vessel fills from right to right bridging collaterals. The PDA is seen to fill from antegrade flow and from left to right collaterals.   Left Ventricular Angiogram: LVEF 35% with severe hypokinesis of the inferior wall.   Impression: 1. 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  2. Moderate LV systolic dysfunction/ischemic cardiomyopathy 3. NSTEMI  Recommendations: He is presenting with a NSTEMI and has surgical coronary artery disease. Will consult CT surgery for possible CABG. He is on Plavix chronically. Will hold Plavix today and resume heparin drip 8 hours post sheath pull. He will need Plavix washout before bypass. Will check P2Y12 today.        Complications:  None. The patient tolerated the procedure well.

## 2013-01-03 NOTE — Progress Notes (Signed)
ANTICOAGULATION CONSULT NOTE  Pharmacy Consult for heparin  Indication: chest pain/ACS  Patient Measurements: Height: 5\' 7"  (170.2 cm) Weight: 263 lb 6.4 oz (119.477 kg) IBW/kg (Calculated) : 66.1 Heparin Dosing Weight: 93.8kg  Vital Signs: Temp: 98.5 F (36.9 C) (03/25 0508) Temp src: Oral (03/25 0508) BP: 116/67 mmHg (03/25 0508) Pulse Rate: 65 (03/25 0722)  Labs:  Recent Labs  01/02/13 1040 01/02/13 1641 01/02/13 2341 01/03/13 0005 01/03/13 0435  HGB 12.7*  --   --   --   --   HCT 37.9*  --   --   --   --   PLT 187  --   --   --   --   LABPROT  --   --   --   --  13.4  INR  --   --   --   --  1.03  HEPARINUNFRC  --  0.14*  --  0.16*  --   CREATININE  --   --   --   --  0.81  TROPONINI 12.62* >20.00* 12.20*  --   --     Estimated Creatinine Clearance: 120 ml/min (by C-G formula based on Cr of 0.81).  Assessment: 62 year old male with chest pain for heparin. Patient now s/p cath found to have severe 3v CAD with distal left main stenosis. He is on chronic plavix which is now being stopped and p2y12 is being checked. Orders to reinitiate heparin this afternoon.  Goal of Therapy:  Heparin level 0.3-0.7 units/ml Monitor platelets by anticoagulation protocol: Yes   Plan:  Restart heparin at 1500 units/hr - recheck 6 hour level  F/U CVTS consult recommendation  Sheppard Coil PharmD., BCPS Clinical Pharmacist Pager [phone] 01/03/2013 10:54 AM

## 2013-01-03 NOTE — Progress Notes (Signed)
PFT completed at bedside. Unconfirmed report placed in Progress Notes of [facility].

## 2013-01-04 DIAGNOSIS — E119 Type 2 diabetes mellitus without complications: Secondary | ICD-10-CM

## 2013-01-04 DIAGNOSIS — I10 Essential (primary) hypertension: Secondary | ICD-10-CM

## 2013-01-04 DIAGNOSIS — I2510 Atherosclerotic heart disease of native coronary artery without angina pectoris: Secondary | ICD-10-CM

## 2013-01-04 DIAGNOSIS — I6350 Cerebral infarction due to unspecified occlusion or stenosis of unspecified cerebral artery: Secondary | ICD-10-CM

## 2013-01-04 LAB — SURGICAL PCR SCREEN
MRSA, PCR: NEGATIVE
Staphylococcus aureus: NEGATIVE

## 2013-01-04 LAB — BASIC METABOLIC PANEL
BUN: 17 mg/dL (ref 6–23)
Calcium: 8.7 mg/dL (ref 8.4–10.5)
GFR calc non Af Amer: >90 mL/min (ref >90)
Glucose, Bld: 132 mg/dL — ABNORMAL HIGH (ref 70–99)

## 2013-01-04 LAB — GLUCOSE, CAPILLARY

## 2013-01-04 LAB — CBC
HCT: 33.2 % — ABNORMAL LOW (ref 39.0–52.0)
Hemoglobin: 11.2 g/dL — ABNORMAL LOW (ref 13.0–17.0)
MCH: 30.9 pg (ref 26.0–34.0)
MCHC: 33.7 g/dL (ref 30.0–36.0)

## 2013-01-04 MED ORDER — DILTIAZEM HCL ER COATED BEADS 120 MG PO CP24
120.0000 mg | ORAL_CAPSULE | Freq: Every day | ORAL | Status: DC
  Administered 2013-01-04 – 2013-01-07 (×3): 120 mg via ORAL
  Filled 2013-01-04 (×6): qty 1

## 2013-01-04 MED ORDER — CHLORHEXIDINE GLUCONATE CLOTH 2 % EX PADS
6.0000 | MEDICATED_PAD | Freq: Every day | CUTANEOUS | Status: DC
  Administered 2013-01-04 – 2013-01-09 (×4): 6 via TOPICAL

## 2013-01-04 NOTE — Progress Notes (Signed)
    SUBJECTIVE: No chest pain or SOB.   BP 123/61  Pulse 51  Temp(Src) 97.9 F (36.6 C) (Oral)  Resp 20  Ht 5\' 7"  (1.702 m)  Wt 264 lb 4.8 oz (119.886 kg)  BMI 41.39 kg/m2  SpO2 99%  Intake/Output Summary (Last 24 hours) at 01/04/13 0735 Last data filed at 01/04/13 2130  Gross per 24 hour  Intake 935.99 ml  Output    825 ml  Net 110.99 ml    PHYSICAL EXAM General: Well developed, well nourished, in no acute distress. Alert and oriented x 3.  Psych:  Good affect, responds appropriately Neck: No JVD. No masses noted.  Lungs: Clear bilaterally with no wheezes or rhonci noted.  Heart: RRR with no murmurs noted. Abdomen: Bowel sounds are present. Soft, non-tender.  Extremities: No lower extremity edema. Left arm contracture.   LABS: Basic Metabolic Panel:  Recent Labs  86/57/84 1040 01/03/13 0435  NA  --  141  K  --  4.0  CL  --  105  CO2  --  25  GLUCOSE  --  110*  BUN  --  18  CREATININE  --  0.81  CALCIUM  --  8.9  MG 1.8  --    CBC:  Recent Labs  01/02/13 1040  WBC 10.9*  HGB 12.7*  HCT 37.9*  MCV 89.4  PLT 187   Cardiac Enzymes:  Recent Labs  01/02/13 1040 01/02/13 1641 01/02/13 2341  TROPONINI 12.62* >20.00* 12.20*   Fasting Lipid Panel:  Recent Labs  01/02/13 1040  CHOL 188  HDL 49  LDLCALC 120*  TRIG 95  CHOLHDL 3.8    Current Meds: . aspirin EC  81 mg Oral Daily  . atorvastatin  80 mg Oral q1800  . diclofenac sodium  1 application Topical BID  . diltiazem  30 mg Oral Q6H  . docusate sodium  200 mg Oral Daily  . finasteride  5 mg Oral Daily  . gabapentin  100 mg Oral TID  . insulin aspart  0-15 Units Subcutaneous TID WC  . loratadine  10 mg Oral Daily  . metoprolol tartrate  25 mg Oral BID  . pantoprazole  40 mg Oral Daily  . PARoxetine  20 mg Oral Daily  . potassium chloride SA  20 mEq Oral Daily  . rOPINIRole  0.25 mg Oral QHS  . sodium chloride  3 mL Intravenous Q12H     ASSESSMENT AND PLAN: 62 y.o. male with no  prior cardiac history with PMHx of HTN, dyslipidemia, DM2, ongoing tobacco abuse, h/o CVA, TBI (w/ L-sided hemiparesis), carotid artery disease, obesity and family h/o CAD who was transferred from [facility] hospital to [facility] 01/02/13 with rapid atrial tachycardia and NSTEMI. He was admitted by Dr. Olga Millers. 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   1. CAD/NSTEMI: Severe triple vessel CAD/severe distal left main disease. He will need surgical revascularization.He is being medically managed with ASA, IV heparin, beta blocker, statin. CT surgery consultation is pending for CABG. P2Y12 pending. He has been on Plavix chronically secondary to CVA. His Plavix has been held for possible CABG.   2. Atrial flutter: Maintaining NSR. Will change Cardizem to long acting. Continue beta blocker.   3. DM: SSI.   4. HTN: BP controlled.   5. Tobacco abuse: Smoking cessation advised.    MCALHANY,CHRISTOPHER  3/26/20147:35 AM

## 2013-01-04 NOTE — Progress Notes (Signed)
CARDIAC REHAB PHASE I   PRE:  Rate/Rhythm: 69 SR with PACs (irregular)    BP: sitting 144/64    SaO2: 96 2L, 96 RA  MODE:  Ambulation: 36 ft   POST:  Rate/Rhythm: 78    BP: sitting 129/61     SaO2: 93 RA  Pt sts he only walks in house. Uses electric w/c o/w. Has brace for left leg but declined to put it on to walk. Used his cane, assist x2 with GB. Pt is very unsteady and very impulsive. Exerted with walking but seemed determined. Denied CP. VSS. To bed after walk. Pt has difficulty moving in general. Will wait on discussing sternal precautions. Did discuss IS use. Pt seemed to struggle to understand. ? Thought there should be medicine in IS. Pt not appropriate for CR services at this time. More appropriate for PT.  [id number]  Harriet Masson CES, ACSM 01/04/2013 11:15 AM
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
## 2013-01-04 NOTE — Evaluation (Signed)
Physical Therapy Evaluation Patient Details Name: [name] MRN: [id number] DOB: 22-May-1951 Today's Date: 01/04/2013 Time: 0865-7846 PT Time Calculation (min): 31 min  PT Assessment / Plan / Recommendation Clinical Impression  pt adm with CP, underwent CATH.  Now scheduled for CABG ?3/31.  Pt is deconditioned and can benefit from PT to recondition  and help prepare for CABG    PT Assessment  Patient needs continued PT services    Follow Up Recommendations  Home health PT;Supervision for mobility/OOB    Does the patient have the potential to tolerate intense rehabilitation      Barriers to Discharge        Equipment Recommendations  None recommended by PT;Other (comment) (TBD)    Recommendations for Other Services     Frequency Min 3X/week    Precautions / Restrictions Precautions Precautions: Fall   Pertinent Vitals/Pain Sats on RA  At rest or amb. B/w 96-98%,  EHR70-80's, HR at rest 57 bpm.  Mild dyspnea only.      Mobility  Bed Mobility Bed Mobility: Supine to Sit;Sitting - Scoot to Edge of Bed Supine to Sit: 4: Min assist;HOB flat Sitting - Scoot to [place] of Bed: 5: Supervision Details for Bed Mobility Assistance: struggle to sit up from flat bed.  vc's to discuss better ways to get up. Transfers Transfers: Sit to Stand;Stand to Sit Sit to Stand: 4: Min guard;With upper extremity assist;From bed Stand to Sit: 4: Min guard;With upper extremity assist;To bed Details for Transfer Assistance: generally safe to stand Ambulation/Gait Ambulation/Gait Assistance: 4: Min guard Ambulation Distance (Feet): 140 Feet Assistive device: Straight cane Ambulation/Gait Assistance Details: Hemiparetic gait on L with minimal instability stemming mostly from the L ankle (double upright AFO not stabilizing fully Stairs: No    Exercises     PT Diagnosis: Other (comment);Hemiplegia non-dominant side (decr activity tolerance, )  PT Problem List: Decreased strength;Decreased  activity tolerance;Decreased balance PT Treatment Interventions: DME instruction;Gait training;Functional mobility training;Therapeutic activities;Balance training;Patient/family education   PT Goals Acute Rehab PT Goals PT Goal Formulation: With patient Time For Goal Achievement: 01/18/13 Potential to Achieve Goals: Good Pt will go Supine/Side to Sit: with supervision PT Goal: Supine/Side to Sit - Progress: Goal set today Pt will go Stand to Sit: with modified independence PT Goal: Stand to Sit - Progress: Goal set today Pt will Transfer Bed to Chair/Chair to Bed: with modified independence PT Transfer Goal: Bed to Chair/Chair to Bed - Progress: Goal set today Pt will Ambulate: >150 feet;with supervision;with least restrictive assistive device PT Goal: Ambulate - Progress: Goal set today  Visit Information  Last PT Received On: 01/04/13 Assistance Needed: +1    Subjective Data  Subjective: I really need that brace on my foot....it needs the other piece put on. Patient Stated Goal: Get through my surgery and get back home   Prior Functioning  Home Living Lives With: Spouse Available Help at Discharge: Family Type of Home: House Home Access: Level entry Home Layout: One level Bathroom Shower/Tub: Health visitor: Standard Home Adaptive Equipment: Paediatric nurse without back;Straight cane Prior Function Level of Independence: Needs assistance Needs Assistance: Bathing;Dressing;Grooming;Light Housekeeping;Meal Prep Driving: No Communication Communication: No difficulties    Cognition  Cognition Overall Cognitive Status: Appears within functional limits for tasks assessed/performed Arousal/Alertness: Awake/alert Orientation Level: Appears intact for tasks assessed Behavior During Session: [facility] for tasks performed    Extremity/Trunk Assessment Right Upper Extremity Assessment RUE ROM/Strength/Tone: Within functional levels Left Upper Extremity Assessment LUE  ROM/Strength/Tone:  Deficits LUE ROM/Strength/Tone Deficits: paretic Right Lower Extremity Assessment RLE ROM/Strength/Tone: Within functional levels RLE Sensation: WFL - Light Touch RLE Coordination: WFL - gross/fine motor Left Lower Extremity Assessment LLE ROM/Strength/Tone: WFL for tasks assessed   Balance Balance Balance Assessed: Yes Static Sitting Balance Static Sitting - Balance Support: Feet supported;No upper extremity supported Static Sitting - Level of Assistance: 5: Stand by assistance Static Standing Balance Static Standing - Balance Support: During functional activity;Right upper extremity supported Static Standing - Level of Assistance: Other (comment) (min guard Assist)  End of Session PT - End of Session Activity Tolerance: Patient tolerated treatment well Patient left: with family/visitor present;with call bell/phone within reach;in bed ( ) Nurse Communication: Mobility status  GP     Carolle Ishii, Eliseo Gum 01/04/2013, 4:35 PM 01/04/2013  Beaver Bing, PT [phone] (pager)

## 2013-01-04 NOTE — Progress Notes (Signed)
ANTICOAGULATION CONSULT NOTE  Pharmacy Consult for heparin  Indication: chest pain/ACS  Patient Measurements: Height: 5\' 7"  (170.2 cm) Weight: 264 lb 4.8 oz (119.886 kg) IBW/kg (Calculated) : 66.1 Heparin Dosing Weight: 93.8kg  Vital Signs: Temp: 97.6 F (36.4 C) (03/26 1100) Temp src: Oral (03/26 1100) BP: 130/71 mmHg (03/26 1100) Pulse Rate: 63 (03/26 1100)  Labs:  Recent Labs  01/02/13 1040  01/02/13 1641 01/02/13 2341 01/03/13 0005 01/03/13 0435 01/03/13 2210 01/04/13 0805 01/04/13 0817  HGB 12.7*  --   --   --   --   --   --  11.2*  --   HCT 37.9*  --   --   --   --   --   --  33.2*  --   PLT 187  --   --   --   --   --   --  160  --   LABPROT  --   --   --   --   --  13.4  --   --   --   INR  --   --   --   --   --  1.03  --   --   --   HEPARINUNFRC  --   < > 0.14*  --  0.16*  --  0.20*  --  0.37  CREATININE  --   --   --   --   --  0.81  --  0.78  --   TROPONINI 12.62*  --  >20.00* 12.20*  --   --   --   --   --   < > = values in this interval not displayed.  Estimated Creatinine Clearance: 121.7 ml/min (by C-G formula based on Cr of 0.78).  Assessment: 62 year old male with 3v CAD s/p cath for heparin. Heparin now at goal (0.3). No bleeding noted. Plan for revascularization after plavix washout.   P2y12 - 223  Goal of Therapy:  Heparin level 0.3-0.7 units/ml Monitor platelets by anticoagulation protocol: Yes   Plan:  Continue heparin 1650 units/hr Follow-up am labs.  Sheppard Coil PharmD., BCPS Clinical Pharmacist Pager [phone] 01/04/2013 12:32 PM

## 2013-01-05 ENCOUNTER — Inpatient Hospital Stay (HOSPITAL_COMMUNITY): Payer: Medicaid Other

## 2013-01-05 LAB — CBC
HCT: 34.5 % — ABNORMAL LOW (ref 39.0–52.0)
MCV: 89.4 fL (ref 78.0–100.0)
RBC: 3.86 MIL/uL — ABNORMAL LOW (ref 4.22–5.81)
WBC: 8.8 10*3/uL (ref 4.0–10.5)

## 2013-01-05 LAB — GLUCOSE, CAPILLARY
Glucose-Capillary: 92 mg/dL (ref 70–99)
Glucose-Capillary: 101 mg/dL — ABNORMAL HIGH (ref 70–99)
Glucose-Capillary: 164 mg/dL — ABNORMAL HIGH (ref 70–99)

## 2013-01-05 LAB — BASIC METABOLIC PANEL
BUN: 13 mg/dL (ref 6–23)
CO2: 23 mEq/L (ref 19–32)
Chloride: 106 mEq/L (ref 96–112)
Creatinine, Ser: 0.72 mg/dL (ref 0.50–1.35)

## 2013-01-05 NOTE — Progress Notes (Signed)
Utilization review completed.  

## 2013-01-05 NOTE — Progress Notes (Signed)
SUBJECTIVE: No chest pain. No SOB  BP 140/66  Pulse 48  Temp(Src) 98.3 F (36.8 C) (Oral)  Resp 16  Ht 5\' 7"  (1.702 m)  Wt 258 lb 11.2 oz (117.346 kg)  BMI 40.51 kg/m2  SpO2 100%  Intake/Output Summary (Last 24 hours) at 01/05/13 4098 Last data filed at 01/05/13 0433  Gross per 24 hour  Intake    240 ml  Output   1300 ml  Net  -1060 ml    PHYSICAL EXAM General: Well developed, well nourished, in no acute distress. Alert and oriented x 3.  Psych:  Good affect, responds appropriately Neck: No JVD. No masses noted.  Lungs: Clear bilaterally with no wheezes or rhonci noted.  Heart: RRR with no murmurs noted. Abdomen: Bowel sounds are present. Soft, non-tender.  Extremities: No lower extremity edema. Left arm contracture.   LABS: Basic Metabolic Panel:  Recent Labs  11/91/47 1040  01/04/13 0805 01/05/13 0453  NA  --   < > 139 140  K  --   < > 3.7 3.9  CL  --   < > 104 106  CO2  --   < > 25 23  GLUCOSE  --   < > 132* 102*  BUN  --   < > 17 13  CREATININE  --   < > 0.78 0.72  CALCIUM  --   < > 8.7 8.9  MG 1.8  --   --   --   < > = values in this interval not displayed. CBC:  Recent Labs  01/04/13 0805 01/05/13 0453  WBC 9.8 8.8  HGB 11.2* 11.7*  HCT 33.2* 34.5*  MCV 91.7 89.4  PLT 160 160   Cardiac Enzymes:  Recent Labs  01/02/13 1040 01/02/13 1641 01/02/13 2341  TROPONINI 12.62* >20.00* 12.20*   Fasting Lipid Panel:  Recent Labs  01/02/13 1040  CHOL 188  HDL 49  LDLCALC 120*  TRIG 95  CHOLHDL 3.8    Current Meds: . aspirin EC  81 mg Oral Daily  . atorvastatin  80 mg Oral q1800  . Chlorhexidine Gluconate Cloth  6 each Topical Q0600  . diclofenac sodium  1 application Topical BID  . diltiazem  120 mg Oral Daily  . docusate sodium  200 mg Oral Daily  . finasteride  5 mg Oral Daily  . gabapentin  100 mg Oral TID  . insulin aspart  0-15 Units Subcutaneous TID WC  . loratadine  10 mg Oral Daily  . metoprolol tartrate  25 mg Oral BID   . pantoprazole  40 mg Oral Daily  . PARoxetine  20 mg Oral Daily  . potassium chloride SA  20 mEq Oral Daily  . rOPINIRole  0.25 mg Oral QHS  . sodium chloride  3 mL Intravenous Q12H     ASSESSMENT AND PLAN: 62 y.o. male with no prior cardiac history with PMHx of HTN, dyslipidemia, DM2, ongoing tobacco abuse, h/o CVA, TBI (w/ L-sided hemiparesis), carotid artery disease, obesity and family h/o CAD who was transferred from [facility] hospital to [facility] 01/02/13 with rapid atrial tachycardia and NSTEMI. He was admitted by Dr. Olga Millers. 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 He is awaiting CABG.   1. CAD/NSTEMI: Severe triple vessel CAD/severe distal left main disease. He has been seen by Dr. Tyrone Sage with CT surgery. Plans for CABG on Monday March 31st after Plavix washout. He is being medically managed with  ASA, IV heparin, beta blocker, statin.  He has been on Plavix chronically secondary to CVA. His Plavix has been held for CABG.   2. Atrial flutter: Maintaining NSR with some ectopy. Continue Cardizem CD 120 mg po Qdaily.  Continue beta blocker.   3. DM: SSI.   4. HTN: BP controlled.   5. Tobacco abuse: Smoking cessation advised.     Annistyn Depass  3/27/20148:07 AM

## 2013-01-05 NOTE — Progress Notes (Signed)
ANTICOAGULATION CONSULT NOTE - Follow Up Consult  Pharmacy Consult for Heparin  Indication: 3VCAD  Allergies  Allergen Reactions  . Other Anaphylaxis    Muscle relaxers; back on Cyclobenzaprine since 12/21/12.  . Morphine And Related Itching    Patient Measurements: Height: 5\' 7"  (170.2 cm) Weight: 258 lb 11.2 oz (117.346 kg) IBW/kg (Calculated) : 66.1 Heparin Dosing Weight:    Vital Signs: Temp: 98.2 F (36.8 C) (03/27 0800) Temp src: Oral (03/27 0800) BP: 130/66 mmHg (03/27 0800) Pulse Rate: 66 (03/27 0800)  Labs:  Recent Labs  01/02/13 1040 01/02/13 1641 01/02/13 2341  01/03/13 0435 01/03/13 2210 01/04/13 0805 01/04/13 0817 01/05/13 0453  HGB 12.7*  --   --   --   --   --  11.2*  --  11.7*  HCT 37.9*  --   --   --   --   --  33.2*  --  34.5*  PLT 187  --   --   --   --   --  160  --  160  LABPROT  --   --   --   --  13.4  --   --   --   --   INR  --   --   --   --  1.03  --   --   --   --   HEPARINUNFRC  --  0.14*  --   < >  --  0.20*  --  0.37 0.53  CREATININE  --   --   --   --  0.81  --  0.78  --  0.72  TROPONINI 12.62* >20.00* 12.20*  --   --   --   --   --   --   < > = values in this interval not displayed.  Estimated Creatinine Clearance: 120.3 ml/min (by C-G formula based on Cr of 0.72).   Assessment::Trx from [facility] for chest pain.  Anticoagulation: NSTEMI, afib with 3VCAD + LM dz. Heparin until CABG. Heparin level 0.53 in goal. CBC stable.  Cardiovascular: HTN, HLD with NSTEMI, Afib -plavix stopped p2y12-223 (01/04/2013)  Meds: ASA 81mg , Lipitor, diltiazem, metoprolol, K+  Endocrinology: DM, CBGs 92-135 on SSI  Neuro: h/o CVA. BI (w/ L-sided hemiparesis), carotid artery disease on Neurontin, Paxil, Requip  GI: Docusate, PPI  Pulm: Ongoing tobacco  Nephrology: Scr 0.72, Proscar  Hematology / Oncology:CBC stable  PTA Medication Issues:linagliptan, metformin, levemir -holding  Best Practices:heparin  Goal of Therapy:  Heparin level 0.3-0.7  units/ml Monitor platelets by anticoagulation protocol: Yes   Plan:  Continue plavix washout for CABG 3/31. Heparin 1650 units/hr to continue   Zully Frane S. Merilynn Finland, PharmD, BCPS Clinical Staff Pharmacist Pager [phone]  Misty Stanley Stillinger 01/05/2013,10:21 AM

## 2013-01-05 NOTE — Progress Notes (Addendum)
Physical Therapy Treatment Patient Details Name: [name] MRN: [id number] DOB: April 11, 1951 Today's Date: 01/05/2013 Time: 3086-5784 PT Time Calculation (min): 21 min  PT Assessment / Plan / Recommendation Comments on Treatment Session  Pt's wife present initially and donned his Lt AFO for pt. Pt with moderate dyspnea with longer distance ambulation, however this did not prevent pt from carrying on a conversation. Instructed to ambulate in hall with nursing assist at least 3x/day until surgery.     Follow Up Recommendations  Other (comment): to be determined after CABG     Does the patient have the potential to tolerate intense rehabilitation     Barriers to Discharge        Equipment Recommendations  None recommended by PT;Other (comment)    Recommendations for Other Services    Frequency Min 3X/week   Plan Other (comment) (d/c plan to be set after CABG)    Precautions / Restrictions Precautions Precautions: Fall Required Braces or Orthoses: Other Brace/Splint Other Brace/Splint: Lt double upright AFO   Pertinent Vitals/Pain deinied pain SaO2 97% RA with HR 78 at rest SaO2 >96% on RA with walking; HRmax 102    Mobility  Bed Mobility Bed Mobility: Rolling Left;Left Sidelying to Sit;Sitting - Scoot to [place] of Bed Rolling Left: 6: Modified independent (Device/Increase time) Left Sidelying to Sit: 5: Supervision;HOB flat Sitting - Scoot to Edge of Bed: 5: Supervision Details for Bed Mobility Assistance: incr effort due to inability to use LUE (previous CVA); pt impulsive and not attending to his IV in RUE or listening to instruction  Transfers Transfers: Sit to Stand;Stand to Sit Sit to Stand: 5: Supervision Stand to Sit: 4: Min assist Details for Transfer Assistance: came to standing without imbalance and waited for cane to be handed to him; on return to sit (after walk) pt sat prematurely, while turning with loss of balance posteriorly and flop onto bed; repeated  transfer with minguard Ambulation/Gait Ambulation/Gait Assistance: 4: Min guard Ambulation Distance (Feet): 350 Feet Assistive device: Straight cane Ambulation/Gait Assistance Details: Hemiparetic gait on L with incr Rt lean to assist with advancing LLE; AFO adjustment made by orthotist and now working properly.    Exercises     PT Diagnosis:    PT Problem List:   PT Treatment Interventions:     PT Goals Acute Rehab PT Goals Pt will go Supine/Side to Sit: with supervision PT Goal: Supine/Side to Sit - Progress: Partly met Pt will go Stand to Sit: with modified independence PT Goal: Stand to Sit - Progress: Progressing toward goal Pt will Ambulate: >150 feet;with supervision;with least restrictive assistive device PT Goal: Ambulate - Progress: Partly met  Visit Information  Last PT Received On: 01/05/13 Assistance Needed: +1    Subjective Data      Cognition  Cognition Overall Cognitive Status: Impaired Area of Impairment: Safety/judgement Arousal/Alertness: Awake/alert Behavior During Session: WFL for tasks performed Safety/Judgement: Impulsive    Balance  Static Sitting Balance Static Sitting - Balance Support: No upper extremity supported;Feet supported Static Sitting - Level of Assistance: 7: Independent Static Standing Balance Static Standing - Balance Support: No upper extremity supported Static Standing - Level of Assistance: 5: Stand by assistance  End of Session PT - End of Session Equipment Utilized During Treatment: Gait belt;Left ankle foot orthosis Activity Tolerance: Patient tolerated treatment well Patient left: in bed;with call bell/phone within reach;with bed alarm set;with nursing in room Nurse Communication: Mobility status   GP     [name] 01/05/2013, 11:51  AM  01/05/2013 Veda Canning, PT Pager: [phone]

## 2013-01-06 LAB — CBC
Platelets: 165 10*3/uL (ref 150–400)
RBC: 3.94 MIL/uL — ABNORMAL LOW (ref 4.22–5.81)
WBC: 8.5 10*3/uL (ref 4.0–10.5)

## 2013-01-06 LAB — GLUCOSE, CAPILLARY: Glucose-Capillary: 172 mg/dL — ABNORMAL HIGH (ref 70–99)

## 2013-01-06 LAB — HEPARIN LEVEL (UNFRACTIONATED): Heparin Unfractionated: 0.33 IU/mL (ref 0.30–0.70)

## 2013-01-06 MED ORDER — ASPIRIN 81 MG PO CHEW
CHEWABLE_TABLET | ORAL | Status: AC
  Administered 2013-01-06: 81 mg
  Filled 2013-01-06: qty 1

## 2013-01-06 MED ORDER — METOPROLOL TARTRATE 12.5 MG HALF TABLET
12.5000 mg | ORAL_TABLET | Freq: Two times a day (BID) | ORAL | Status: DC
  Administered 2013-01-06 – 2013-01-08 (×5): 12.5 mg via ORAL
  Filled 2013-01-06 (×8): qty 1

## 2013-01-06 NOTE — Progress Notes (Signed)
CARDIAC REHAB PHASE I   PRE:  Rate/Rhythm: 52SB  BP:  Supine:   Sitting: 120/80  Standing:    SaO2: 95%RA  MODE:  Ambulation: 140 ft   POST:  Rate/Rhythm: 71  BP:  Supine:   Sitting: 160/70  Standing:    SaO2: 97%RA 1046-1116 Pt pt's leg brace and shoe put on . Used gait belt and asst x 2 with cane. Pt walked 140 ft, very tired by end of walk. To chair until we made his bed as it was dirty. Then assisted pt to bed and put alarm on. Denied CP. Will keep as asst x 2 for [place].   Luetta Nutting, RN BSN  01/06/2013 11:12 AM

## 2013-01-06 NOTE — Progress Notes (Signed)
PT Cancellation Note  Patient Details Name: [name] MRN: [id number] DOB: 1950-10-29   Cancelled Treatment:    Reason Treat Not Completed: Pt politely declined. He was sleeping on arrival and reports nursing helped him walk earlier.   Exa Bomba 01/06/2013, 4:32 PM  01/06/2013 Veda Canning, PT Pager: [phone]

## 2013-01-06 NOTE — Progress Notes (Signed)
ANTICOAGULATION CONSULT NOTE - Follow Up Consult  Pharmacy Consult for Heparin  Indication: 3VCAD  Allergies  Allergen Reactions  . Other Anaphylaxis    Muscle relaxers; back on Cyclobenzaprine since 12/21/12.  . Morphine And Related Itching    Patient Measurements: Height: 5\' 7"  (170.2 cm) Weight: 258 lb 11.2 oz (117.346 kg) IBW/kg (Calculated) : 66.1   Vital Signs: Temp: 97.5 F (36.4 C) (03/28 0500) Temp src: Oral (03/28 0500) BP: 125/70 mmHg (03/28 0500) Pulse Rate: 54 (03/28 0500)  Labs:  Recent Labs  01/04/13 0805 01/04/13 0817 01/05/13 0453 01/06/13 0433  HGB 11.2*  --  11.7* 12.0*  HCT 33.2*  --  34.5* 35.1*  PLT 160  --  160 165  HEPARINUNFRC  --  0.37 0.53 0.33  CREATININE 0.78  --  0.72  --     Estimated Creatinine Clearance: 120.3 ml/min (by C-G formula based on Cr of 0.72).   Assessment::Trx from [facility] for chest pain.  Patient with triple vessel CAD undergoing plavix washout for CABG planned for Monday 3/31. CBC is stable and heparin gtt continues to be at goal. No bleeding issues noted.  Goal of Therapy:  Heparin level 0.3-0.7 units/ml Monitor platelets by anticoagulation protocol: Yes   Plan:  Continue plavix washout for CABG 3/31. Heparin 1650 units/hr to continue  Sheppard Coil PharmD., BCPS Clinical Pharmacist Pager [phone] 01/06/2013 8:46 AM

## 2013-01-06 NOTE — Progress Notes (Signed)
    SUBJECTIVE: No chest pain or SOB. NO events.   Tele: sinus brady  BP 125/70  Pulse 54  Temp(Src) 97.5 F (36.4 C) (Oral)  Resp 20  Ht 5\' 7"  (1.702 m)  Wt 258 lb 11.2 oz (117.346 kg)  BMI 40.51 kg/m2  SpO2 98%  Intake/Output Summary (Last 24 hours) at 01/06/13 4098 Last data filed at 01/06/13 0500  Gross per 24 hour  Intake    360 ml  Output    600 ml  Net   -240 ml    PHYSICAL EXAM General: Well developed, well nourished, in no acute distress. Alert and oriented x 3.  Psych:  Good affect, responds appropriately Neck: No JVD. No masses noted.  Lungs: Clear bilaterally with no wheezes or rhonci noted.  Heart: Regular, brady with no murmurs noted. Abdomen: Bowel sounds are present. Soft, non-tender.  Extremities: No lower extremity edema.   LABS: Basic Metabolic Panel:  Recent Labs  11/91/47 0805 01/05/13 0453  NA 139 140  K 3.7 3.9  CL 104 106  CO2 25 23  GLUCOSE 132* 102*  BUN 17 13  CREATININE 0.78 0.72  CALCIUM 8.7 8.9   CBC:  Recent Labs  01/05/13 0453 01/06/13 0433  WBC 8.8 8.5  HGB 11.7* 12.0*  HCT 34.5* 35.1*  MCV 89.4 89.1  PLT 160 165   Current Meds: . aspirin EC  81 mg Oral Daily  . atorvastatin  80 mg Oral q1800  . Chlorhexidine Gluconate Cloth  6 each Topical Q0600  . diclofenac sodium  1 application Topical BID  . diltiazem  120 mg Oral Daily  . docusate sodium  200 mg Oral Daily  . finasteride  5 mg Oral Daily  . gabapentin  100 mg Oral TID  . insulin aspart  0-15 Units Subcutaneous TID WC  . loratadine  10 mg Oral Daily  . metoprolol tartrate  25 mg Oral BID  . pantoprazole  40 mg Oral Daily  . PARoxetine  20 mg Oral Daily  . potassium chloride SA  20 mEq Oral Daily  . rOPINIRole  0.25 mg Oral QHS  . sodium chloride  3 mL Intravenous Q12H     ASSESSMENT AND PLAN: 62 y.o. male with no prior cardiac history with PMHx of HTN, dyslipidemia, DM2, ongoing tobacco abuse, h/o CVA, TBI (w/ L-sided hemiparesis), carotid artery  disease, obesity and family h/o CAD who was transferred from [facility] hospital to [facility] 01/02/13 with rapid atrial tachycardia and NSTEMI. He was admitted by Dr. Olga Millers. 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 He is awaiting CABG on Monday March 31st.   1. CAD/NSTEMI: Severe triple vessel CAD/severe distal left main disease. He has been seen by Dr. Tyrone Sage with CT surgery. Plans for CABG on Monday March 31st after Plavix washout. He is being medically managed with ASA, IV heparin, beta blocker, statin. He has been on Plavix chronically secondary to CVA. His Plavix has been held for CABG.   2. Atrial flutter: Maintaining sinus brady. Continue Cardizem CD 120 mg po Qdaily. Will lower metoprolol to 12.5 mg po BID with bradycardia.    3. DM: SSI.   4. HTN: BP controlled.   5. Tobacco abuse: Smoking cessation advised.     [name]  3/28/20147:14 AM

## 2013-01-07 DIAGNOSIS — I2510 Atherosclerotic heart disease of native coronary artery without angina pectoris: Secondary | ICD-10-CM

## 2013-01-07 LAB — BASIC METABOLIC PANEL
CO2: 21 mEq/L (ref 19–32)
Chloride: 106 mEq/L (ref 96–112)
Creatinine, Ser: 0.76 mg/dL (ref 0.50–1.35)

## 2013-01-07 LAB — GLUCOSE, CAPILLARY
Glucose-Capillary: 114 mg/dL — ABNORMAL HIGH (ref 70–99)
Glucose-Capillary: 113 mg/dL — ABNORMAL HIGH (ref 70–99)
Glucose-Capillary: 174 mg/dL — ABNORMAL HIGH (ref 70–99)

## 2013-01-07 LAB — CBC
Hemoglobin: 11.7 g/dL — ABNORMAL LOW (ref 13.0–17.0)
MCH: 30.6 pg (ref 26.0–34.0)
MCV: 89 fL (ref 78.0–100.0)
RBC: 3.82 MIL/uL — ABNORMAL LOW (ref 4.22–5.81)

## 2013-01-07 LAB — HEPARIN LEVEL (UNFRACTIONATED): Heparin Unfractionated: 0.54 IU/mL (ref 0.30–0.70)

## 2013-01-07 NOTE — Progress Notes (Signed)
ANTICOAGULATION CONSULT NOTE - Follow Up Consult  Pharmacy Consult for Heparin  Indication: 3VCAD  Allergies  Allergen Reactions  . Other Anaphylaxis    Muscle relaxers; back on Cyclobenzaprine since 12/21/12.  . Morphine And Related Itching    Patient Measurements: Height: 5\' 7"  (170.2 cm) Weight: 256 lb 9.9 oz (116.4 kg) IBW/kg (Calculated) : 66.1   Vital Signs: Temp: 98.1 F (36.7 C) (03/29 0523) Temp src: Oral (03/29 0523) BP: 145/77 mmHg (03/29 0523) Pulse Rate: 57 (03/29 0523)  Labs:  Recent Labs  01/05/13 0453 01/06/13 0433 01/07/13 0500  HGB 11.7* 12.0* 11.7*  HCT 34.5* 35.1* 34.0*  PLT 160 165 167  HEPARINUNFRC 0.53 0.33 0.54  CREATININE 0.72  --   --     Estimated Creatinine Clearance: 119.7 ml/min (by C-G formula based on Cr of 0.72).   Assessment::Trx from [facility] for chest pain.  Patient with triple vessel CAD undergoing plavix washout for CABG planned for Monday 3/31. CBC is stable and heparin gtt fluctuates but continues to be at goal. No bleeding issues noted.  Goal of Therapy:  Heparin level 0.3-0.7 units/ml Monitor platelets by anticoagulation protocol: Yes   Plan:  Continue plavix washout for CABG 3/31. Heparin 1650 units/hr to continue  Sheppard Coil PharmD., BCPS Clinical Pharmacist Pager [phone] 01/07/2013 8:12 AM

## 2013-01-07 NOTE — Progress Notes (Signed)
Patient ID: [name], male   DOB: [birth year], 62 y.o.   MRN: [id number]                   [address]            [place] [address]          [id number]     4 Days Post-Op Procedure(s) (LRB): LEFT HEART CATHETERIZATION WITH CORONARY ANGIOGRAM (N/A)  LOS: 5 days   Subjective: Feels better, no chest pain today  Objective: Vital signs in last 24 hours: Patient Vitals for the past 24 hrs:  BP Temp Temp src Pulse Resp SpO2 Weight  01/07/13 0523 145/77 mmHg 98.1 F (36.7 C) Oral 57 16 95 % 256 lb 9.9 oz (116.4 kg)  01/06/13 2156 135/75 mmHg 98.4 F (36.9 C) Oral 56 16 96 % -  01/06/13 1410 134/66 mmHg 98.4 F (36.9 C) Oral 54 20 98 % -    Filed Weights   01/05/13 0000 01/05/13 0500 01/07/13 0523  Weight: 258 lb 4.8 oz (117.164 kg) 258 lb 11.2 oz (117.346 kg) 256 lb 9.9 oz (116.4 kg)    Hemodynamic parameters for last 24 hours:    Intake/Output from previous day: 03/28 0701 - 03/29 0700 In: 858 [P.O.:600; I.V.:198] Out: 300 [Urine:300] Intake/Output this shift: Total I/O In: 240 [P.O.:240] Out: -   Scheduled Meds: . aspirin EC  81 mg Oral Daily  . atorvastatin  80 mg Oral q1800  . Chlorhexidine Gluconate Cloth  6 each Topical Q0600  . diclofenac sodium  1 application Topical BID  . diltiazem  120 mg Oral Daily  . docusate sodium  200 mg Oral Daily  . finasteride  5 mg Oral Daily  . gabapentin  100 mg Oral TID  . insulin aspart  0-15 Units Subcutaneous TID WC  . loratadine  10 mg Oral Daily  . metoprolol tartrate  12.5 mg Oral BID  . pantoprazole  40 mg Oral Daily  . PARoxetine  20 mg Oral Daily  . potassium chloride SA  20 mEq Oral Daily  . rOPINIRole  0.25 mg Oral QHS  . sodium chloride  3 mL Intravenous Q12H   Continuous Infusions: . heparin 1,650 Units/hr (01/07/13 1030)   PRN Meds:.sodium chloride, acetaminophen, ALPRAZolam, HYDROcodone-acetaminophen, nitroGLYCERIN, ondansetron (ZOFRAN) IV, sodium chloride  General appearance: alert  and cooperative Neurologic: intact except known previous right hemispheric stroke affecting the left arm and left leg Heart: regular rate and rhythm, S1, S2 normal, no murmur, click, rub or gallop and normal apical impulse Lungs: clear to auscultation bilaterally Abdomen: soft, non-tender; bowel sounds normal; no masses,  no organomegaly Extremities: extremities normal, atraumatic, no cyanosis or edema and No lower extremity edema the patient's left arm is severely contracted in a flexed position .  Lab Results: CBC: Recent Labs  01/06/13 0433 01/07/13 0500  WBC 8.5 8.7  HGB 12.0* 11.7*  HCT 35.1* 34.0*  PLT 165 167   BMET:  Recent Labs  01/05/13 0453 01/07/13 0500  NA 140 139  K 3.9 3.7  CL 106 106  CO2 23 21  GLUCOSE 102* 109*  BUN 13 13  CREATININE 0.72 0.76  CALCIUM 8.9 8.6    PT/INR: No results found for this basename: LABPROT, INR,  in the last 72 hours   Radiology Dg Chest 2 View  01/05/2013  *RADIOLOGY REPORT*  Clinical Data: 62 year old male preoperative study for CABG. Hypertension.  CHEST - 2 VIEW  Comparison:  [facility] 01/02/2013 and earlier.  Findings: AP and lateral views of the chest. Stable cardiomegaly and mediastinal contours.  Visualized tracheal air column is within normal limits.  Stable lung parenchyma.  No pneumothorax, pulmonary edema, pleural effusion or confluent pulmonary opacity. No acute osseous abnormality identified.  IMPRESSION: No acute cardiopulmonary abnormality.   Original Report Authenticated By: Erskine Speed, M.D.      Assessment/Plan: S/P Procedure(s) (LRB): LEFT HEART CATHETERIZATION WITH CORONARY ANGIOGRAM (N/A) Plan to proceed with coronary bypass grafting March 31. The goals risks and alternatives of the planned surgical procedure CABG  have been discussed with the patient in detail. The risks of the procedure including death, infection, stroke, myocardial infarction, bleeding, blood transfusion have all been  discussed specifically.  I have quoted KESEAN SERVISS a 4 % of perioperative mortality and a complication rate as high as 30 %. The patient's questions have been answered.JERIMEY BURRIDGE is willing  to proceed with the planned procedure.   Delight Ovens MD 01/07/2013 1:31 PM

## 2013-01-07 NOTE — Progress Notes (Signed)
Was notified by lab that the P2Y12 for Plavix cannont be sent to be tested until Monday to the lab in [place]. The test was able to be processed in our lab but not anymore. This test was ordered by Dr. Tyrone Sage for surgery on Monday. Dr. Tyrone Sage was notified.

## 2013-01-07 NOTE — Progress Notes (Signed)
SUBJECTIVE:  No chest pain.  No SOB   PHYSICAL EXAM Filed Vitals:   01/06/13 1024 01/06/13 1410 01/06/13 2156 01/07/13 0523  BP: 123/52 134/66 135/75 145/77  Pulse: 50 54 56 57  Temp:  98.4 F (36.9 C) 98.4 F (36.9 C) 98.1 F (36.7 C)  TempSrc:  Oral Oral Oral  Resp:  20 16 16   Height:      Weight:    256 lb 9.9 oz (116.4 kg)  SpO2:  98% 96% 95%   General:  No distress Lungs:  Clear Heart:  RRR Abdomen:  Positive bowel sounds, no rebound no guarding Extremities:  No edema  LABS: Lab Results  Component Value Date   CKTOTAL 46 06/02/2012   CKMB 1.8 06/02/2012   TROPONINI 12.20* 01/02/2013   Results for orders placed during the hospital encounter of 01/02/13 (from the past 24 hour(s))  GLUCOSE, CAPILLARY     Status: Abnormal   Collection Time    01/06/13  4:41 PM      Result Value Range   Glucose-Capillary 129 (*) 70 - 99 mg/dL  GLUCOSE, CAPILLARY     Status: Abnormal   Collection Time    01/06/13  9:55 PM      Result Value Range   Glucose-Capillary 172 (*) 70 - 99 mg/dL  CBC     Status: Abnormal   Collection Time    01/07/13  5:00 AM      Result Value Range   WBC 8.7  4.0 - 10.5 K/uL   RBC 3.82 (*) 4.22 - 5.81 MIL/uL   Hemoglobin 11.7 (*) 13.0 - 17.0 g/dL   HCT 09.8 (*) 11.9 - 14.7 %   MCV 89.0  78.0 - 100.0 fL   MCH 30.6  26.0 - 34.0 pg   MCHC 34.4  30.0 - 36.0 g/dL   RDW 82.9  56.2 - 13.0 %   Platelets 167  150 - 400 K/uL  HEPARIN LEVEL (UNFRACTIONATED)     Status: None   Collection Time    01/07/13  5:00 AM      Result Value Range   Heparin Unfractionated 0.54  0.30 - 0.70 IU/mL  BASIC METABOLIC PANEL     Status: Abnormal   Collection Time    01/07/13  5:00 AM      Result Value Range   Sodium 139  135 - 145 mEq/L   Potassium 3.7  3.5 - 5.1 mEq/L   Chloride 106  96 - 112 mEq/L   CO2 21  19 - 32 mEq/L   Glucose, Bld 109 (*) 70 - 99 mg/dL   BUN 13  6 - 23 mg/dL   Creatinine, Ser 8.65  0.50 - 1.35 mg/dL   Calcium 8.6  8.4 - 78.4 mg/dL   GFR calc  non Af Amer >90  >90 mL/min   GFR calc Af Amer >90  >90 mL/min  GLUCOSE, CAPILLARY     Status: Abnormal   Collection Time    01/07/13  7:49 AM      Result Value Range   Glucose-Capillary 114 (*) 70 - 99 mg/dL  GLUCOSE, CAPILLARY     Status: Abnormal   Collection Time    01/07/13 11:43 AM      Result Value Range   Glucose-Capillary 139 (*) 70 - 99 mg/dL    Intake/Output Summary (Last 24 hours) at 01/07/13 1201 Last data filed at 01/07/13 0800  Gross per 24 hour  Intake    738 ml  Output      0 ml  Net    738 ml    ASSESSMENT AND PLAN:  CAD/NSTEMI:  CABG on Monday.  Continue current meds.   Atrial flutter:  NSR with rate in the 50s.  I will place hold orders on the Cardizem.   DM:  Blood sugars OK.    Continue current SSI.    HTN:  Continue meds with hold orders as above.     Fayrene Fearing [facility] 01/07/2013 12:01 PM

## 2013-01-08 LAB — COMPREHENSIVE METABOLIC PANEL
ALT: 25 U/L (ref 0–53)
AST: 19 U/L (ref 0–37)
Albumin: 3.4 g/dL — ABNORMAL LOW (ref 3.5–5.2)
Alkaline Phosphatase: 71 U/L (ref 39–117)
BUN: 12 mg/dL (ref 6–23)
CO2: 23 mEq/L (ref 19–32)
Calcium: 8.7 mg/dL (ref 8.4–10.5)
Chloride: 104 mEq/L (ref 96–112)
Creatinine, Ser: 0.78 mg/dL (ref 0.50–1.35)
GFR calc Af Amer: >90 mL/min (ref >90)
GFR calc non Af Amer: >90 mL/min (ref >90)
Glucose, Bld: 110 mg/dL — ABNORMAL HIGH (ref 70–99)
Potassium: 4.1 mEq/L (ref 3.5–5.1)
Sodium: 138 mEq/L (ref 135–145)
Total Bilirubin: 0.2 mg/dL — ABNORMAL LOW (ref 0.3–1.2)
Total Protein: 7.2 g/dL (ref 6.0–8.3)

## 2013-01-08 LAB — URINALYSIS, ROUTINE W REFLEX MICROSCOPIC
Bilirubin Urine: NEGATIVE
Glucose, UA: NEGATIVE mg/dL
Hgb urine dipstick: NEGATIVE
Ketones, ur: NEGATIVE mg/dL
Leukocytes, UA: NEGATIVE
Nitrite: NEGATIVE
Protein, ur: NEGATIVE mg/dL
Specific Gravity, Urine: 1.011 (ref 1.005–1.030)
Urobilinogen, UA: 1 mg/dL (ref 0.0–1.0)
pH: 6 (ref 5.0–8.0)

## 2013-01-08 LAB — GLUCOSE, CAPILLARY
Glucose-Capillary: 105 mg/dL — ABNORMAL HIGH (ref 70–99)
Glucose-Capillary: 151 mg/dL — ABNORMAL HIGH (ref 70–99)

## 2013-01-08 LAB — TYPE AND SCREEN
ABO/RH(D): B POS
Antibody Screen: NEGATIVE

## 2013-01-08 LAB — CBC
MCH: 30.4 pg (ref 26.0–34.0)
MCV: 91.6 fL (ref 78.0–100.0)
Platelets: 188 10*3/uL (ref 150–400)
RBC: 4.15 MIL/uL — ABNORMAL LOW (ref 4.22–5.81)
RDW: 14.2 % (ref 11.5–15.5)
WBC: 9.8 10*3/uL (ref 4.0–10.5)

## 2013-01-08 LAB — HEPARIN LEVEL (UNFRACTIONATED): Heparin Unfractionated: 0.53 IU/mL (ref 0.30–0.70)

## 2013-01-08 MED ORDER — TEMAZEPAM 15 MG PO CAPS: 15.0000 mg | ORAL_CAPSULE | Freq: Once | ORAL | Status: AC | PRN

## 2013-01-08 MED ORDER — DEXTROSE 5 % IV SOLN
1.5000 g | INTRAVENOUS | Status: AC
  Administered 2013-01-09: .75 g via INTRAVENOUS
  Administered 2013-01-09: 1.5 g via INTRAVENOUS
  Filled 2013-01-08: qty 1.5

## 2013-01-08 MED ORDER — CHLORHEXIDINE GLUCONATE 4 % EX LIQD
60.0000 mL | Freq: Once | CUTANEOUS | Status: AC
  Administered 2013-01-09: 4 via TOPICAL
  Filled 2013-01-08: qty 60

## 2013-01-08 MED ORDER — MAGNESIUM SULFATE 50 % IJ SOLN
40.0000 meq | INTRAMUSCULAR | Status: DC
  Filled 2013-01-08: qty 10

## 2013-01-08 MED ORDER — PLASMA-LYTE 148 IV SOLN
INTRAVENOUS | Status: AC
  Administered 2013-01-09: 10:00:00
  Filled 2013-01-08: qty 2.5

## 2013-01-08 MED ORDER — SODIUM CHLORIDE 0.9 % IV SOLN
INTRAVENOUS | Status: AC
  Administered 2013-01-09: 14 mL/h via INTRAVENOUS
  Administered 2013-01-09: 13:00:00 via INTRAVENOUS
  Filled 2013-01-08: qty 40

## 2013-01-08 MED ORDER — METOPROLOL TARTRATE 12.5 MG HALF TABLET
12.5000 mg | ORAL_TABLET | Freq: Once | ORAL | Status: AC
  Administered 2013-01-09: 12.5 mg via ORAL
  Filled 2013-01-08: qty 1

## 2013-01-08 MED ORDER — DEXMEDETOMIDINE HCL IN NACL 400 MCG/100ML IV SOLN
0.1000 ug/kg/h | INTRAVENOUS | Status: AC
  Administered 2013-01-09: 0.2 ug/kg/h via INTRAVENOUS
  Filled 2013-01-08: qty 100

## 2013-01-08 MED ORDER — EPINEPHRINE HCL 1 MG/ML IJ SOLN
0.5000 ug/min | INTRAMUSCULAR | Status: DC
  Filled 2013-01-08: qty 4

## 2013-01-08 MED ORDER — BISACODYL 5 MG PO TBEC
5.0000 mg | DELAYED_RELEASE_TABLET | Freq: Once | ORAL | Status: DC
  Filled 2013-01-08: qty 1

## 2013-01-08 MED ORDER — CHLORHEXIDINE GLUCONATE 4 % EX LIQD
60.0000 mL | Freq: Once | CUTANEOUS | Status: AC
  Administered 2013-01-08: 4 via TOPICAL
  Filled 2013-01-08: qty 60

## 2013-01-08 MED ORDER — DEXTROSE 5 % IV SOLN
750.0000 mg | INTRAVENOUS | Status: DC
  Filled 2013-01-08 (×2): qty 750

## 2013-01-08 MED ORDER — DOPAMINE-DEXTROSE 3.2-5 MG/ML-% IV SOLN
2.0000 ug/kg/min | INTRAVENOUS | Status: AC
  Administered 2013-01-09: 3 ug/kg/min via INTRAVENOUS
  Filled 2013-01-08: qty 250

## 2013-01-08 MED ORDER — SODIUM CHLORIDE 0.9 % IV SOLN
INTRAVENOUS | Status: AC
  Administered 2013-01-09: 1 [IU]/h via INTRAVENOUS
  Filled 2013-01-08: qty 1

## 2013-01-08 MED ORDER — VANCOMYCIN HCL 10 G IV SOLR
1500.0000 mg | INTRAVENOUS | Status: AC
  Administered 2013-01-09: 1500 mg via INTRAVENOUS
  Filled 2013-01-08: qty 1500

## 2013-01-08 MED ORDER — POTASSIUM CHLORIDE 2 MEQ/ML IV SOLN
80.0000 meq | INTRAVENOUS | Status: DC
  Filled 2013-01-08: qty 40

## 2013-01-08 MED ORDER — PHENYLEPHRINE HCL 10 MG/ML IJ SOLN
30.0000 ug/min | INTRAVENOUS | Status: DC
  Administered 2013-01-09: 65 ug/min via INTRAVENOUS
  Filled 2013-01-08: qty 2

## 2013-01-08 MED ORDER — NITROGLYCERIN IN D5W 200-5 MCG/ML-% IV SOLN
2.0000 ug/min | INTRAVENOUS | Status: AC
  Administered 2013-01-09: 3 ug/min via INTRAVENOUS
  Filled 2013-01-08: qty 250

## 2013-01-08 NOTE — Progress Notes (Signed)
ANTICOAGULATION CONSULT NOTE - Follow Up Consult  Pharmacy Consult for Heparin  Indication: 3VCAD  Allergies  Allergen Reactions  . Other Anaphylaxis    Muscle relaxers; back on Cyclobenzaprine since 12/21/12.  . Morphine And Related Itching    Patient Measurements: Height: 5\' 7"  (170.2 cm) Weight: 258 lb 3.2 oz (117.119 kg) IBW/kg (Calculated) : 66.1   Vital Signs: Temp: 97.5 F (36.4 C) (03/30 0500) Temp src: Oral (03/29 2100) BP: 148/73 mmHg (03/30 0500) Pulse Rate: 58 (03/30 0500)  Labs:  Recent Labs  01/06/13 0433 01/07/13 0500 01/08/13 0505  HGB 12.0* 11.7* 12.6*  HCT 35.1* 34.0* 38.0*  PLT 165 167 188  HEPARINUNFRC 0.33 0.54 0.53  CREATININE  --  0.76 0.78    Estimated Creatinine Clearance: 120.1 ml/min (by C-G formula based on Cr of 0.78).   Assessment::Trx from [facility] for chest pain.  Patient with triple vessel CAD undergoing plavix washout for CABG planned for Monday 3/31. CBC is stable and heparin gtt stabilizing and at goal. No bleeding issues noted. CBC stable  Goal of Therapy:  Heparin level 0.3-0.7 units/ml Monitor platelets by anticoagulation protocol: Yes   Plan:  Continue plavix washout for CABG 3/31. Heparin 1650 units/hr to continue  Sheppard Coil PharmD., BCPS Clinical Pharmacist Pager [phone] 01/08/2013 7:09 AM

## 2013-01-08 NOTE — Progress Notes (Signed)
Patient ID: [name], male   DOB: 1950-12-29, 62 y.o.   MRN: [id number]                   [address]            [place] [address]          [phone]     5 Days Post-Op Procedure(s) (LRB): LEFT HEART CATHETERIZATION WITH CORONARY ANGIOGRAM (N/A)  LOS: 6 days   Subjective: No chest pain , patient says ready for surgery  Objective: Vital signs in last 24 hours: Patient Vitals for the past 24 hrs:  BP Temp Temp src Pulse Resp SpO2 Weight  01/08/13 1344 117/52 mmHg 98.4 F (36.9 C) Oral 54 16 97 % -  01/08/13 0500 148/73 mmHg 97.5 F (36.4 C) - 58 18 95 % 258 lb 3.2 oz (117.119 kg)  01/07/13 2100 117/65 mmHg 97.6 F (36.4 C) Oral 60 18 94 % -    Filed Weights   01/05/13 0500 01/07/13 0523 01/08/13 0500  Weight: 258 lb 11.2 oz (117.346 kg) 256 lb 9.9 oz (116.4 kg) 258 lb 3.2 oz (117.119 kg)    Hemodynamic parameters for last 24 hours:    Intake/Output from previous day: 03/29 0701 - 03/30 0700 In: 1317.4 [P.O.:480; I.V.:837.4] Out: 700 [Urine:700] Intake/Output this shift: Total I/O In: 360 [P.O.:360] Out: -   Scheduled Meds: . [START ON 01/09/2013] aminocaproic acid (AMICAR) for OHS   Intravenous To OR  . aspirin EC  81 mg Oral Daily  . atorvastatin  80 mg Oral q1800  . bisacodyl  5 mg Oral Once  . [START ON 01/09/2013] cefUROXime (ZINACEF)  IV  1.5 g Intravenous To OR  . [START ON 01/09/2013] cefUROXime (ZINACEF)  IV  750 mg Intravenous To OR  . chlorhexidine  60 mL Topical Once  . [START ON 01/09/2013] chlorhexidine  60 mL Topical Once  . Chlorhexidine Gluconate Cloth  6 each Topical Q0600  . [START ON 01/09/2013] dexmedetomidine  0.1-0.7 mcg/kg/hr Intravenous To OR  . diclofenac sodium  1 application Topical BID  . diltiazem  120 mg Oral Daily  . docusate sodium  200 mg Oral Daily  . [START ON 01/09/2013] DOPamine  2-20 mcg/kg/min Intravenous To OR  . [START ON 01/09/2013] epinephrine  0.5-20 mcg/min Intravenous To OR  . finasteride  5 mg  Oral Daily  . gabapentin  100 mg Oral TID  . [START ON 01/09/2013] heparin-papaverine-plasmalyte irrigation   Irrigation To OR  . insulin aspart  0-15 Units Subcutaneous TID WC  . [START ON 01/09/2013] insulin (NOVOLIN-R) infusion   Intravenous To OR  . loratadine  10 mg Oral Daily  . [START ON 01/09/2013] magnesium sulfate  40 mEq Other To OR  . metoprolol tartrate  12.5 mg Oral BID  . [START ON 01/09/2013] metoprolol tartrate  12.5 mg Oral Once  . [START ON 01/09/2013] nitroGLYCERIN  2-200 mcg/min Intravenous To OR  . pantoprazole  40 mg Oral Daily  . PARoxetine  20 mg Oral Daily  . [START ON 01/09/2013] phenylephrine (NEO-SYNEPHRINE) Adult infusion  30-200 mcg/min Intravenous To OR  . [START ON 01/09/2013] potassium chloride  80 mEq Other To OR  . potassium chloride SA  20 mEq Oral Daily  . rOPINIRole  0.25 mg Oral QHS  . sodium chloride  3 mL Intravenous Q12H  . [START ON 01/09/2013] vancomycin  1,500 mg Intravenous To OR   Continuous Infusions: . heparin 1,650 Units/hr (  01/08/13 0600)   PRN Meds:.sodium chloride, acetaminophen, ALPRAZolam, HYDROcodone-acetaminophen, nitroGLYCERIN, ondansetron (ZOFRAN) IV, sodium chloride, temazepam  .  Lab Results: CBC: Recent Labs  01/07/13 0500 01/08/13 0505  WBC 8.7 9.8  HGB 11.7* 12.6*  HCT 34.0* 38.0*  PLT 167 188   BMET:  Recent Labs  01/07/13 0500 01/08/13 0505  NA 139 138  K 3.7 4.1  CL 106 104  CO2 21 23  GLUCOSE 109* 110*  BUN 13 12  CREATININE 0.76 0.78  CALCIUM 8.6 8.7    PT/INR: No results found for this basename: LABPROT, INR,  in the last 72 hours   Radiology No results found.   Assessment/Plan: S/P Procedure(s) (LRB): LEFT HEART CATHETERIZATION WITH CORONARY ANGIOGRAM (N/A) Plan to proceed with coronary bypass grafting in am.  The goals risks and alternatives of the planned surgical procedure CABG have been discussed with the patient in detail. The risks of the procedure including death, infection, stroke,  myocardial infarction, bleeding, blood transfusion have all been discussed specifically. I have quoted [name] a 4 % of perioperative mortality and a complication rate as high as 30 %. The patient's questions have been answered.[name] is willing to proceed with the planned procedure.    Delight Ovens MD 01/08/2013 6:33 PM

## 2013-01-08 NOTE — Progress Notes (Signed)
Primary cardiologist: Dr. Olga Millers  Subjective:   No chest pain or palpitations.   Objective:   Temp:  [97.5 F (36.4 C)-98.2 F (36.8 C)] 97.5 F (36.4 C) (03/30 0500) Pulse Rate:  [58-69] 58 (03/30 0500) Resp:  [16-18] 18 (03/30 0500) BP: (104-148)/(65-73) 148/73 mmHg (03/30 0500) SpO2:  [94 %-95 %] 95 % (03/30 0500) Weight:  [258 lb 3.2 oz (117.119 kg)] 258 lb 3.2 oz (117.119 kg) (03/30 0500) Last BM Date: 01/05/13  Filed Weights   01/05/13 0500 01/07/13 0523 01/08/13 0500  Weight: 258 lb 11.2 oz (117.346 kg) 256 lb 9.9 oz (116.4 kg) 258 lb 3.2 oz (117.119 kg)    Intake/Output Summary (Last 24 hours) at 01/08/13 1039 Last data filed at 01/08/13 0600  Gross per 24 hour  Intake 1077.42 ml  Output    700 ml  Net 377.42 ml   Telemetry: Heart rate 50-60s.  Exam:  General: NAD.  Lungs: Diminished, nonlabored.  Cardiac: Irregular, no gallop.  Extremities: No pitting.  Lab Results:  Basic Metabolic Panel:  Recent Labs Lab 01/02/13 1040  01/05/13 0453 01/07/13 0500 01/08/13 0505  NA  --   < > 140 139 138  K  --   < > 3.9 3.7 4.1  CL  --   < > 106 106 104  CO2  --   < > 23 21 23   GLUCOSE  --   < > 102* 109* 110*  BUN  --   < > 13 13 12   CREATININE  --   < > 0.72 0.76 0.78  CALCIUM  --   < > 8.9 8.6 8.7  MG 1.8  --   --   --   --   < > = values in this interval not displayed.  Liver Function Tests:  Recent Labs Lab 01/08/13 0505  AST 19  ALT 25  ALKPHOS 71  BILITOT 0.2*  PROT 7.2  ALBUMIN 3.4*    CBC:  Recent Labs Lab 01/06/13 0433 01/07/13 0500 01/08/13 0505  WBC 8.5 8.7 9.8  HGB 12.0* 11.7* 12.6*  HCT 35.1* 34.0* 38.0*  MCV 89.1 89.0 91.6  PLT 165 167 188    Cardiac Enzymes:  Recent Labs Lab 01/02/13 1040 01/02/13 1641 01/02/13 2341  TROPONINI 12.62* >20.00* 12.20*    Medications:   Scheduled Medications: . aspirin EC  81 mg Oral Daily  . atorvastatin  80 mg Oral q1800  . chlorhexidine  60 mL Topical Once  .  Chlorhexidine Gluconate Cloth  6 each Topical Q0600  . diclofenac sodium  1 application Topical BID  . diltiazem  120 mg Oral Daily  . docusate sodium  200 mg Oral Daily  . finasteride  5 mg Oral Daily  . gabapentin  100 mg Oral TID  . insulin aspart  0-15 Units Subcutaneous TID WC  . loratadine  10 mg Oral Daily  . metoprolol tartrate  12.5 mg Oral BID  . pantoprazole  40 mg Oral Daily  . PARoxetine  20 mg Oral Daily  . potassium chloride SA  20 mEq Oral Daily  . rOPINIRole  0.25 mg Oral QHS  . sodium chloride  3 mL Intravenous Q12H     Infusions: . heparin 1,650 Units/hr (01/08/13 0600)     PRN Medications:  sodium chloride, acetaminophen, ALPRAZolam, HYDROcodone-acetaminophen, nitroGLYCERIN, ondansetron (ZOFRAN) IV, sodium chloride, temazepam   Assessment:   1. Multivessel CAD for CABG on Monday. No recurrent chest pain.  2. NSTEMI.  3. Atrial flutter, on Cardizem CD.  4. History of stroke, Plavix held for CABG.  5. HTN.   Plan/Discussion:    Patient is for CABG with Dr. Tyrone Sage tomorrow. No change in current medical regimen.    Jonelle Sidle, M.D., F.A.C.C.

## 2013-01-09 ENCOUNTER — Encounter (HOSPITAL_COMMUNITY)
Admission: AD | Disposition: A | Discharge status: Skilled Nursing Facility | Payer: Self-pay | Source: Other Acute Inpatient Hospital | Attending: Cardiothoracic Surgery

## 2013-01-09 ENCOUNTER — Encounter (HOSPITAL_COMMUNITY): Payer: Self-pay | Admitting: Certified Registered Nurse Anesthetist

## 2013-01-09 ENCOUNTER — Inpatient Hospital Stay (HOSPITAL_COMMUNITY): Payer: Medicaid Other | Admitting: Certified Registered Nurse Anesthetist

## 2013-01-09 ENCOUNTER — Inpatient Hospital Stay (HOSPITAL_COMMUNITY): Payer: Medicaid Other

## 2013-01-09 DIAGNOSIS — I2510 Atherosclerotic heart disease of native coronary artery without angina pectoris: Secondary | ICD-10-CM

## 2013-01-09 HISTORY — PX: CORONARY ARTERY BYPASS GRAFT: SHX141

## 2013-01-09 HISTORY — PX: INTRAOPERATIVE TRANSESOPHAGEAL ECHOCARDIOGRAM: SHX5062

## 2013-01-09 LAB — GLUCOSE, CAPILLARY
Glucose-Capillary: 187 mg/dL — ABNORMAL HIGH (ref 70–99)
Glucose-Capillary: 138 mg/dL — ABNORMAL HIGH (ref 70–99)
Glucose-Capillary: 166 mg/dL — ABNORMAL HIGH (ref 70–99)

## 2013-01-09 LAB — BLOOD GAS, ARTERIAL
Acid-base deficit: 1.6 mmol/L (ref 0.0–2.0)
Bicarbonate: 22.8 mEq/L (ref 20.0–24.0)
Drawn by: 28701
FIO2: 0.21 %
O2 Saturation: 93.5 %
Patient temperature: 98.6
TCO2: 24.1 mmol/L (ref 0–100)
pCO2 arterial: 40.2 mmHg (ref 35.0–45.0)
pH, Arterial: 7.373 (ref 7.350–7.450)
pO2, Arterial: 69.6 mmHg — ABNORMAL LOW (ref 80.0–100.0)

## 2013-01-09 LAB — POCT I-STAT 3, ART BLOOD GAS (G3+)
Acid-Base Excess: 1 mmol/L (ref 0.0–2.0)
Acid-base deficit: 3 mmol/L — ABNORMAL HIGH (ref 0.0–2.0)
Acid-base deficit: 4 mmol/L — ABNORMAL HIGH (ref 0.0–2.0)
Acid-base deficit: 4 mmol/L — ABNORMAL HIGH (ref 0.0–2.0)
Bicarbonate: 26 meq/L — ABNORMAL HIGH (ref 20.0–24.0)
Bicarbonate: 21.5 mEq/L (ref 20.0–24.0)
Bicarbonate: 20.8 mEq/L (ref 20.0–24.0)
O2 Saturation: 100 %
O2 Saturation: 92 %
Patient temperature: 35.3
Patient temperature: 36.5
Patient temperature: 98
TCO2: 27 mmol/L (ref 0–100)
TCO2: 23 mmol/L (ref 0–100)
TCO2: 22 mmol/L (ref 0–100)
pCO2 arterial: 42.1 mmHg (ref 35.0–45.0)
pH, Arterial: 7.399 (ref 7.350–7.450)
pH, Arterial: 7.369 (ref 7.350–7.450)
pH, Arterial: 7.359 (ref 7.350–7.450)
pO2, Arterial: 463 mmHg — ABNORMAL HIGH (ref 80.0–100.0)
pO2, Arterial: 117 mmHg — ABNORMAL HIGH (ref 80.0–100.0)
pO2, Arterial: 75 mmHg — ABNORMAL LOW (ref 80.0–100.0)
pO2, Arterial: 64 mmHg — ABNORMAL LOW (ref 80.0–100.0)

## 2013-01-09 LAB — CBC
HCT: 20.1 % — ABNORMAL LOW (ref 39.0–52.0)
HCT: 27.6 % — ABNORMAL LOW (ref 39.0–52.0)
Hemoglobin: 7 g/dL — ABNORMAL LOW (ref 13.0–17.0)
Hemoglobin: 9.7 g/dL — ABNORMAL LOW (ref 13.0–17.0)
MCH: 30.7 pg (ref 26.0–34.0)
MCH: 30.4 pg (ref 26.0–34.0)
MCHC: 34.8 g/dL (ref 30.0–36.0)
MCHC: 35.1 g/dL (ref 30.0–36.0)
MCV: 86.5 fL (ref 78.0–100.0)
Platelets: 157 10*3/uL (ref 150–400)
RBC: 2.28 MIL/uL — ABNORMAL LOW (ref 4.22–5.81)
RBC: 3.19 MIL/uL — ABNORMAL LOW (ref 4.22–5.81)
RDW: 13.8 % (ref 11.5–15.5)
WBC: 17.1 10*3/uL — ABNORMAL HIGH (ref 4.0–10.5)

## 2013-01-09 LAB — POCT I-STAT, CHEM 8
BUN: 9 mg/dL (ref 6–23)
Calcium, Ion: 1.16 mmol/L (ref 1.13–1.30)
Creatinine, Ser: 0.8 mg/dL (ref 0.50–1.35)
TCO2: 22 mmol/L (ref 0–100)

## 2013-01-09 LAB — POCT I-STAT 4, (NA,K, GLUC, HGB,HCT)
Glucose, Bld: 102 mg/dL — ABNORMAL HIGH (ref 70–99)
Glucose, Bld: 102 mg/dL — ABNORMAL HIGH (ref 70–99)
Glucose, Bld: 177 mg/dL — ABNORMAL HIGH (ref 70–99)
Glucose, Bld: 211 mg/dL — ABNORMAL HIGH (ref 70–99)
Glucose, Bld: 184 mg/dL — ABNORMAL HIGH (ref 70–99)
HCT: 31 % — ABNORMAL LOW (ref 39.0–52.0)
HCT: 29 % — ABNORMAL LOW (ref 39.0–52.0)
HCT: 28 % — ABNORMAL LOW (ref 39.0–52.0)
HCT: 27 % — ABNORMAL LOW (ref 39.0–52.0)
HCT: 32 % — ABNORMAL LOW (ref 39.0–52.0)
Hemoglobin: 10.5 g/dL — ABNORMAL LOW (ref 13.0–17.0)
Hemoglobin: 9.9 g/dL — ABNORMAL LOW (ref 13.0–17.0)
Hemoglobin: 9.5 g/dL — ABNORMAL LOW (ref 13.0–17.0)
Hemoglobin: 9.2 g/dL — ABNORMAL LOW (ref 13.0–17.0)
Hemoglobin: 10.9 g/dL — ABNORMAL LOW (ref 13.0–17.0)
Potassium: 4.1 meq/L (ref 3.5–5.1)
Potassium: 4.2 meq/L (ref 3.5–5.1)
Potassium: 3.7 mEq/L (ref 3.5–5.1)
Potassium: 3.3 meq/L — ABNORMAL LOW (ref 3.5–5.1)
Sodium: 140 meq/L (ref 135–145)
Sodium: 139 meq/L (ref 135–145)
Sodium: 139 mEq/L (ref 135–145)
Sodium: 139 meq/L (ref 135–145)
Sodium: 139 mEq/L (ref 135–145)

## 2013-01-09 LAB — MAGNESIUM
Magnesium: 2 mg/dL (ref 1.5–2.5)
Magnesium: 2.4 mg/dL (ref 1.5–2.5)

## 2013-01-09 LAB — CREATININE, SERUM
Creatinine, Ser: 0.73 mg/dL (ref 0.50–1.35)
GFR calc Af Amer: >90 mL/min (ref >90)
GFR calc non Af Amer: >90 mL/min (ref >90)

## 2013-01-09 LAB — PROTIME-INR
INR: 1.59 — ABNORMAL HIGH (ref 0.00–1.49)
Prothrombin Time: 18.5 seconds — ABNORMAL HIGH (ref 11.6–15.2)

## 2013-01-09 LAB — PLATELET COUNT: Platelets: 129 10*3/uL — ABNORMAL LOW (ref 150–400)

## 2013-01-09 LAB — HEMOGLOBIN AND HEMATOCRIT, BLOOD
HCT: 25.8 % — ABNORMAL LOW (ref 39.0–52.0)
Hemoglobin: 9.1 g/dL — ABNORMAL LOW (ref 13.0–17.0)

## 2013-01-09 LAB — TSH: TSH: 1.657 u[IU]/mL (ref 0.350–4.500)

## 2013-01-09 SURGERY — CORONARY ARTERY BYPASS GRAFTING (CABG)
Anesthesia: General | Site: Throat | Wound class: Clean

## 2013-01-09 MED ORDER — DOPAMINE-DEXTROSE 3.2-5 MG/ML-% IV SOLN: 0.0000 ug/kg/min | INTRAVENOUS | Status: DC

## 2013-01-09 MED ORDER — SODIUM CHLORIDE 0.9 % IV SOLN
INTRAVENOUS | Status: DC
  Administered 2013-01-11: 17:00:00 via INTRAVENOUS

## 2013-01-09 MED ORDER — LACTATED RINGERS IV SOLN
INTRAVENOUS | Status: DC | PRN
  Administered 2013-01-09 (×2): via INTRAVENOUS

## 2013-01-09 MED ORDER — PANTOPRAZOLE SODIUM 40 MG PO TBEC
40.0000 mg | DELAYED_RELEASE_TABLET | Freq: Every day | ORAL | Status: DC
  Administered 2013-01-11: 40 mg via ORAL
  Filled 2013-01-09: qty 1

## 2013-01-09 MED ORDER — MAGNESIUM SULFATE 40 MG/ML IJ SOLN
4.0000 g | Freq: Once | INTRAMUSCULAR | Status: AC
  Administered 2013-01-09: 4 g via INTRAVENOUS
  Filled 2013-01-09: qty 100

## 2013-01-09 MED ORDER — 0.9 % SODIUM CHLORIDE (POUR BTL) OPTIME
TOPICAL | Status: DC | PRN
  Administered 2013-01-09: 5000 mL

## 2013-01-09 MED ORDER — ATORVASTATIN CALCIUM 20 MG PO TABS
20.0000 mg | ORAL_TABLET | Freq: Every day | ORAL | Status: DC
  Administered 2013-01-10 – 2013-01-18 (×9): 20 mg via ORAL
  Filled 2013-01-09 (×11): qty 1

## 2013-01-09 MED ORDER — LACTATED RINGERS IV SOLN
INTRAVENOUS | Status: DC
  Administered 2013-01-09: 15:00:00 via INTRAVENOUS

## 2013-01-09 MED ORDER — SODIUM CHLORIDE 0.9 % IJ SOLN
OROMUCOSAL | Status: DC | PRN
  Administered 2013-01-09: 10:00:00 via TOPICAL

## 2013-01-09 MED ORDER — METOPROLOL TARTRATE 12.5 MG HALF TABLET
12.5000 mg | ORAL_TABLET | Freq: Two times a day (BID) | ORAL | Status: DC
  Administered 2013-01-10 (×2): 12.5 mg via ORAL
  Filled 2013-01-09 (×5): qty 1

## 2013-01-09 MED ORDER — PROTAMINE SULFATE 10 MG/ML IV SOLN
INTRAVENOUS | Status: DC | PRN
  Administered 2013-01-09 (×3): 50 mg via INTRAVENOUS
  Administered 2013-01-09: 20 mg via INTRAVENOUS
  Administered 2013-01-09 (×4): 50 mg via INTRAVENOUS

## 2013-01-09 MED ORDER — BISACODYL 5 MG PO TBEC
10.0000 mg | DELAYED_RELEASE_TABLET | Freq: Every day | ORAL | Status: DC
  Administered 2013-01-10 – 2013-01-11 (×2): 10 mg via ORAL
  Filled 2013-01-09 (×2): qty 2

## 2013-01-09 MED ORDER — AMINOCAPROIC ACID 250 MG/ML IV SOLN
INTRAVENOUS | Status: DC | PRN
  Administered 2013-01-09: 5 g via INTRAVENOUS

## 2013-01-09 MED ORDER — SODIUM CHLORIDE 0.9 % IV SOLN
5.0000 g | Freq: Once | INTRAVENOUS | Status: DC
  Administered 2013-01-09: 5 g via INTRAVENOUS
  Filled 2013-01-09: qty 20

## 2013-01-09 MED ORDER — MICROFIBRILLAR COLL HEMOSTAT EX PADS
MEDICATED_PAD | CUTANEOUS | Status: DC | PRN
  Administered 2013-01-09: 1 via TOPICAL

## 2013-01-09 MED ORDER — ONDANSETRON HCL 4 MG/2ML IJ SOLN: 4.0000 mg | Freq: Four times a day (QID) | INTRAMUSCULAR | Status: DC | PRN

## 2013-01-09 MED ORDER — HYDROMORPHONE HCL PF 1 MG/ML IJ SOLN: 0.2500 mg | INTRAMUSCULAR | Status: DC | PRN

## 2013-01-09 MED ORDER — PROPOFOL 10 MG/ML IV BOLUS
INTRAVENOUS | Status: DC | PRN
  Administered 2013-01-09: 110 mg via INTRAVENOUS

## 2013-01-09 MED ORDER — ACETAMINOPHEN 500 MG PO TABS
1000.0000 mg | ORAL_TABLET | Freq: Four times a day (QID) | ORAL | Status: DC
  Administered 2013-01-10 – 2013-01-12 (×9): 1000 mg via ORAL
  Filled 2013-01-09 (×13): qty 2

## 2013-01-09 MED ORDER — ASPIRIN EC 325 MG PO TBEC
325.0000 mg | DELAYED_RELEASE_TABLET | Freq: Every day | ORAL | Status: DC
  Administered 2013-01-10 – 2013-01-11 (×2): 325 mg via ORAL
  Filled 2013-01-09 (×3): qty 1

## 2013-01-09 MED ORDER — OXYCODONE HCL 5 MG/5ML PO SOLN: 5.0000 mg | Freq: Once | ORAL | Status: DC | PRN

## 2013-01-09 MED ORDER — ALBUMIN HUMAN 5 % IV SOLN
250.0000 mL | INTRAVENOUS | Status: AC | PRN
  Administered 2013-01-09 (×2): 250 mL via INTRAVENOUS

## 2013-01-09 MED ORDER — FENTANYL CITRATE 0.05 MG/ML IJ SOLN
INTRAMUSCULAR | Status: DC | PRN
  Administered 2013-01-09: 350 ug via INTRAVENOUS
  Administered 2013-01-09 (×3): 100 ug via INTRAVENOUS
  Administered 2013-01-09: 250 ug via INTRAVENOUS
  Administered 2013-01-09: 50 ug via INTRAVENOUS
  Administered 2013-01-09: 100 ug via INTRAVENOUS
  Administered 2013-01-09: 150 ug via INTRAVENOUS
  Administered 2013-01-09: 200 ug via INTRAVENOUS

## 2013-01-09 MED ORDER — ACETAMINOPHEN 160 MG/5ML PO SOLN: 975.0000 mg | Freq: Four times a day (QID) | ORAL | Status: DC

## 2013-01-09 MED ORDER — GLYCOPYRROLATE 0.2 MG/ML IJ SOLN
INTRAMUSCULAR | Status: DC | PRN
  Administered 2013-01-09: 0.2 mg via INTRAVENOUS

## 2013-01-09 MED ORDER — PLASMA-LYTE 148 IV SOLN
INTRAVENOUS | Status: DC
  Filled 2013-01-09: qty 2.5

## 2013-01-09 MED ORDER — SODIUM CHLORIDE 0.9 % IV SOLN
INTRAVENOUS | Status: DC
  Administered 2013-01-10: 2.7 [IU]/h via INTRAVENOUS
  Administered 2013-01-10: 1.2 [IU]/h via INTRAVENOUS
  Filled 2013-01-09 (×4): qty 1

## 2013-01-09 MED ORDER — HEPARIN SODIUM (PORCINE) 1000 UNIT/ML IJ SOLN
INTRAMUSCULAR | Status: DC | PRN
  Administered 2013-01-09: 20000 [IU] via INTRAVENOUS
  Administered 2013-01-09: 8000 [IU] via INTRAVENOUS
  Administered 2013-01-09: 15000 [IU] via INTRAVENOUS

## 2013-01-09 MED ORDER — METOPROLOL TARTRATE 25 MG/10 ML ORAL SUSPENSION
12.5000 mg | Freq: Two times a day (BID) | ORAL | Status: DC
  Filled 2013-01-09 (×5): qty 5

## 2013-01-09 MED ORDER — FAMOTIDINE IN NACL 20-0.9 MG/50ML-% IV SOLN
20.0000 mg | Freq: Two times a day (BID) | INTRAVENOUS | Status: AC
  Administered 2013-01-09 – 2013-01-10 (×2): 20 mg via INTRAVENOUS
  Filled 2013-01-09: qty 50

## 2013-01-09 MED ORDER — SODIUM CHLORIDE 0.9 % IV SOLN
10.0000 mg | INTRAVENOUS | Status: DC | PRN
  Administered 2013-01-09: 5 ug/min via INTRAVENOUS

## 2013-01-09 MED ORDER — POTASSIUM CHLORIDE 10 MEQ/50ML IV SOLN
10.0000 meq | INTRAVENOUS | Status: AC
  Administered 2013-01-09 (×3): 10 meq via INTRAVENOUS

## 2013-01-09 MED ORDER — ASPIRIN 81 MG PO CHEW: 324.0000 mg | CHEWABLE_TABLET | Freq: Every day | ORAL | Status: DC

## 2013-01-09 MED ORDER — LACTATED RINGERS IV SOLN
INTRAVENOUS | Status: DC | PRN
  Administered 2013-01-09: 08:00:00 via INTRAVENOUS

## 2013-01-09 MED ORDER — INSULIN REGULAR BOLUS VIA INFUSION
0.0000 [IU] | Freq: Three times a day (TID) | INTRAVENOUS | Status: DC
  Administered 2013-01-10: 3 [IU] via INTRAVENOUS
  Administered 2013-01-10: 1 [IU] via INTRAVENOUS
  Filled 2013-01-09: qty 10

## 2013-01-09 MED ORDER — MILRINONE IN DEXTROSE 20 MG/100ML IV SOLN
0.1250 ug/kg/min | INTRAVENOUS | Status: DC
  Administered 2013-01-09: 0.3 ug/kg/min via INTRAVENOUS
  Administered 2013-01-10: 0.375 ug/kg/min via INTRAVENOUS
  Filled 2013-01-09 (×2): qty 100

## 2013-01-09 MED ORDER — SODIUM CHLORIDE 0.45 % IV SOLN
INTRAVENOUS | Status: DC
  Administered 2013-01-09: 15:00:00 via INTRAVENOUS

## 2013-01-09 MED ORDER — DOCUSATE SODIUM 100 MG PO CAPS
200.0000 mg | ORAL_CAPSULE | Freq: Every day | ORAL | Status: DC
  Administered 2013-01-10 – 2013-01-11 (×2): 200 mg via ORAL
  Filled 2013-01-09 (×2): qty 2

## 2013-01-09 MED ORDER — MIDAZOLAM HCL 5 MG/5ML IJ SOLN
INTRAMUSCULAR | Status: DC | PRN
  Administered 2013-01-09: 3 mg via INTRAVENOUS
  Administered 2013-01-09: 2 mg via INTRAVENOUS
  Administered 2013-01-09: 4 mg via INTRAVENOUS
  Administered 2013-01-09: 3 mg via INTRAVENOUS
  Administered 2013-01-09: 2 mg via INTRAVENOUS

## 2013-01-09 MED ORDER — SODIUM CHLORIDE 0.9 % IV SOLN
INTRAVENOUS | Status: DC | PRN
  Administered 2013-01-09: 13:00:00 via INTRAVENOUS

## 2013-01-09 MED ORDER — FENTANYL CITRATE 0.05 MG/ML IJ SOLN
50.0000 ug | INTRAMUSCULAR | Status: DC | PRN
  Administered 2013-01-09: 100 ug via INTRAVENOUS
  Administered 2013-01-10 (×3): 50 ug via INTRAVENOUS
  Filled 2013-01-09 (×2): qty 2

## 2013-01-09 MED ORDER — MILRINONE IN DEXTROSE 20 MG/100ML IV SOLN
0.1250 ug/kg/min | INTRAVENOUS | Status: AC
  Administered 2013-01-09: .3 ug/kg/min via INTRAVENOUS
  Filled 2013-01-09: qty 100

## 2013-01-09 MED ORDER — LACTATED RINGERS IV SOLN
INTRAVENOUS | Status: DC | PRN
  Administered 2013-01-09 (×3): via INTRAVENOUS

## 2013-01-09 MED ORDER — BISACODYL 10 MG RE SUPP: 10.0000 mg | Freq: Every day | RECTAL | Status: DC

## 2013-01-09 MED ORDER — ALBUMIN HUMAN 5 % IV SOLN
INTRAVENOUS | Status: DC | PRN
  Administered 2013-01-09: 14:00:00 via INTRAVENOUS

## 2013-01-09 MED ORDER — DOPAMINE-DEXTROSE 3.2-5 MG/ML-% IV SOLN
3.0000 ug/kg/min | INTRAVENOUS | Status: DC
  Administered 2013-01-09: 3 ug/kg/min via INTRAVENOUS

## 2013-01-09 MED ORDER — DEXMEDETOMIDINE HCL IN NACL 400 MCG/100ML IV SOLN
0.1000 ug/kg/h | INTRAVENOUS | Status: AC
  Filled 2013-01-09 (×4): qty 100

## 2013-01-09 MED ORDER — PHENYLEPHRINE HCL 10 MG/ML IJ SOLN
0.0000 ug/min | INTRAVENOUS | Status: DC
  Administered 2013-01-09 (×2): 65 ug/min via INTRAVENOUS
  Filled 2013-01-09 (×5): qty 2

## 2013-01-09 MED ORDER — SODIUM CHLORIDE 0.9 % IV SOLN: 250.0000 mL | INTRAVENOUS | Status: DC

## 2013-01-09 MED ORDER — ACETAMINOPHEN 10 MG/ML IV SOLN
1000.0000 mg | Freq: Once | INTRAVENOUS | Status: AC
  Administered 2013-01-09: 1000 mg via INTRAVENOUS
  Filled 2013-01-09 (×2): qty 100

## 2013-01-09 MED ORDER — SODIUM CHLORIDE 0.9 % IJ SOLN
3.0000 mL | Freq: Two times a day (BID) | INTRAMUSCULAR | Status: DC
Start: 2013-01-10 — End: 2013-01-12
  Administered 2013-01-10: 3 mL via INTRAVENOUS

## 2013-01-09 MED ORDER — OXYCODONE HCL 5 MG PO TABS
5.0000 mg | ORAL_TABLET | ORAL | Status: DC | PRN
  Administered 2013-01-09: 5 mg via ORAL
  Administered 2013-01-10 (×2): 10 mg via ORAL
  Administered 2013-01-10 (×3): 5 mg via ORAL
  Administered 2013-01-11 – 2013-01-12 (×5): 10 mg via ORAL
  Filled 2013-01-09: qty 1
  Filled 2013-01-09 (×5): qty 2
  Filled 2013-01-09: qty 1
  Filled 2013-01-09: qty 2
  Filled 2013-01-09 (×2): qty 1
  Filled 2013-01-09: qty 2

## 2013-01-09 MED ORDER — VECURONIUM BROMIDE 10 MG IV SOLR
INTRAVENOUS | Status: DC | PRN
  Administered 2013-01-09 (×2): 5 mg via INTRAVENOUS
  Administered 2013-01-09 (×2): 10 mg via INTRAVENOUS

## 2013-01-09 MED ORDER — ROCURONIUM BROMIDE 100 MG/10ML IV SOLN
INTRAVENOUS | Status: DC | PRN
  Administered 2013-01-09: 50 mg via INTRAVENOUS

## 2013-01-09 MED ORDER — LACTATED RINGERS IV SOLN: 500.0000 mL | Freq: Once | INTRAVENOUS | Status: AC | PRN

## 2013-01-09 MED ORDER — AMIODARONE HCL IN DEXTROSE 360-4.14 MG/200ML-% IV SOLN
60.0000 mg/h | INTRAVENOUS | Status: AC
  Administered 2013-01-09: 60 mg/h via INTRAVENOUS
  Filled 2013-01-09: qty 200

## 2013-01-09 MED ORDER — NITROGLYCERIN IN D5W 200-5 MCG/ML-% IV SOLN: 0.0000 ug/min | INTRAVENOUS | Status: DC

## 2013-01-09 MED ORDER — ONDANSETRON HCL 4 MG/2ML IJ SOLN: 4.0000 mg | Freq: Once | INTRAMUSCULAR | Status: DC | PRN

## 2013-01-09 MED ORDER — SODIUM CHLORIDE 0.9 % IJ SOLN: 3.0000 mL | INTRAMUSCULAR | Status: DC | PRN

## 2013-01-09 MED ORDER — METOCLOPRAMIDE HCL 5 MG/ML IJ SOLN
10.0000 mg | Freq: Four times a day (QID) | INTRAMUSCULAR | Status: AC
  Administered 2013-01-09 – 2013-01-10 (×4): 10 mg via INTRAVENOUS
  Filled 2013-01-09 (×4): qty 2

## 2013-01-09 MED ORDER — OXYCODONE HCL 5 MG PO TABS: 5.0000 mg | ORAL_TABLET | Freq: Once | ORAL | Status: DC | PRN

## 2013-01-09 MED ORDER — LEVALBUTEROL HCL 0.63 MG/3ML IN NEBU
0.6300 mg | INHALATION_SOLUTION | Freq: Three times a day (TID) | RESPIRATORY_TRACT | Status: DC
  Administered 2013-01-09 – 2013-01-11 (×6): 0.63 mg via RESPIRATORY_TRACT
  Filled 2013-01-09 (×14): qty 3

## 2013-01-09 MED ORDER — DEXMEDETOMIDINE HCL IN NACL 200 MCG/50ML IV SOLN
0.1000 ug/kg/h | INTRAVENOUS | Status: DC
  Administered 2013-01-09: 0.7 ug/kg/h via INTRAVENOUS
  Filled 2013-01-09: qty 50

## 2013-01-09 MED ORDER — EPHEDRINE SULFATE 50 MG/ML IJ SOLN
INTRAMUSCULAR | Status: DC | PRN
  Administered 2013-01-09 (×2): 5 mg via INTRAVENOUS

## 2013-01-09 MED ORDER — VANCOMYCIN HCL IN DEXTROSE 1-5 GM/200ML-% IV SOLN
1000.0000 mg | Freq: Once | INTRAVENOUS | Status: AC
  Administered 2013-01-09: 1000 mg via INTRAVENOUS
  Filled 2013-01-09: qty 200

## 2013-01-09 MED ORDER — AMIODARONE HCL IN DEXTROSE 360-4.14 MG/200ML-% IV SOLN
30.0000 mg/h | INTRAVENOUS | Status: DC
  Filled 2013-01-09 (×3): qty 200

## 2013-01-09 MED ORDER — DEXTROSE 5 % IV SOLN
1.5000 g | Freq: Two times a day (BID) | INTRAVENOUS | Status: AC
  Administered 2013-01-10 – 2013-01-11 (×4): 1.5 g via INTRAVENOUS
  Filled 2013-01-09 (×4): qty 1.5

## 2013-01-09 MED ORDER — MIDAZOLAM HCL 2 MG/2ML IJ SOLN: 2.0000 mg | INTRAMUSCULAR | Status: DC | PRN

## 2013-01-09 MED ORDER — METOPROLOL TARTRATE 1 MG/ML IV SOLN: 2.5000 mg | INTRAVENOUS | Status: DC | PRN

## 2013-01-09 SURGICAL SUPPLY — 115 items
ADH SKN CLS APL DERMABOND .7 (GAUZE/BANDAGES/DRESSINGS) ×4
ATTRACTOMAT 16X20 MAGNETIC DRP (DRAPES) ×3 IMPLANT
BAG DECANTER FOR FLEXI CONT (MISCELLANEOUS) ×3 IMPLANT
BANDAGE ELASTIC 4 VELCRO ST LF (GAUZE/BANDAGES/DRESSINGS) ×1 IMPLANT
BANDAGE ELASTIC 6 VELCRO ST LF (GAUZE/BANDAGES/DRESSINGS) ×2 IMPLANT
BANDAGE GAUZE ELAST BULKY 4 IN (GAUZE/BANDAGES/DRESSINGS) ×2 IMPLANT
BLADE STERNUM SYSTEM 6 (BLADE) ×3 IMPLANT
BLADE SURG 11 STRL SS (BLADE) ×1 IMPLANT
BLADE SURG ROTATE 9660 (MISCELLANEOUS) IMPLANT
CANISTER SUCTION 2500CC (MISCELLANEOUS) ×3 IMPLANT
CANN PRFSN .5XCNCT 15X34-48 (MISCELLANEOUS) ×2
CANNULA AORTIC HI-FLOW 6.5M20F (CANNULA) ×3 IMPLANT
CANNULA PRFSN .5XCNCT 15X34-48 (MISCELLANEOUS) ×2 IMPLANT
CANNULA VEN 2 STAGE (MISCELLANEOUS) ×3
CATH CPB KIT GERHARDT (MISCELLANEOUS) ×3 IMPLANT
CATH THORACIC 28FR (CATHETERS) ×3 IMPLANT
CATH THORACIC 36FR (CATHETERS) IMPLANT
CATH THORACIC 36FR RT ANG (CATHETERS) IMPLANT
CLIP RETRACTION 3.0MM CORONARY (MISCELLANEOUS) ×1 IMPLANT
CLIP TI MEDIUM 24 (CLIP) IMPLANT
CLIP TI WIDE RED SMALL 24 (CLIP) IMPLANT
CLOTH BEACON ORANGE TIMEOUT ST (SAFETY) ×3 IMPLANT
COVER SURGICAL LIGHT HANDLE (MISCELLANEOUS) ×3 IMPLANT
CRADLE DONUT ADULT HEAD (MISCELLANEOUS) ×3 IMPLANT
DERMABOND ADVANCED (GAUZE/BANDAGES/DRESSINGS) ×2
DERMABOND ADVANCED .7 DNX12 (GAUZE/BANDAGES/DRESSINGS) ×2 IMPLANT
DRAIN CHANNEL 28F RND 3/8 FF (WOUND CARE) ×3 IMPLANT
DRAPE CARDIOVASCULAR INCISE (DRAPES) ×3
DRAPE SLUSH/WARMER DISC (DRAPES) ×3 IMPLANT
DRAPE SRG 135X102X78XABS (DRAPES) ×2 IMPLANT
DRSG COVADERM 4X14 (GAUZE/BANDAGES/DRESSINGS) ×1 IMPLANT
ELECT BLADE 4.0 EZ CLEAN MEGAD (MISCELLANEOUS) ×3
ELECT REM PT RETURN 9FT ADLT (ELECTROSURGICAL) ×6
ELECTRODE BLDE 4.0 EZ CLN MEGD (MISCELLANEOUS) ×2 IMPLANT
ELECTRODE REM PT RTRN 9FT ADLT (ELECTROSURGICAL) ×4 IMPLANT
GLOVE BIO SURGEON STRL SZ 6 (GLOVE) ×4 IMPLANT
GLOVE BIO SURGEON STRL SZ 6.5 (GLOVE) ×23 IMPLANT
GLOVE BIO SURGEON STRL SZ7 (GLOVE) IMPLANT
GLOVE BIO SURGEON STRL SZ7.5 (GLOVE) IMPLANT
GLOVE BIOGEL PI IND STRL 6 (GLOVE) ×4 IMPLANT
GLOVE BIOGEL PI IND STRL 6.5 (GLOVE) ×4 IMPLANT
GLOVE BIOGEL PI IND STRL 7.0 (GLOVE) IMPLANT
GLOVE BIOGEL PI INDICATOR 6 (GLOVE) ×4
GLOVE BIOGEL PI INDICATOR 6.5 (GLOVE) ×4
GLOVE BIOGEL PI INDICATOR 7.0 (GLOVE)
GOWN STRL NON-REIN LRG LVL3 (GOWN DISPOSABLE) ×18 IMPLANT
HEMOSTAT POWDER SURGIFOAM 1G (HEMOSTASIS) ×9 IMPLANT
HEMOSTAT SURGICEL 2X14 (HEMOSTASIS) ×3 IMPLANT
INSERT FOGARTY 61MM (MISCELLANEOUS) ×1 IMPLANT
INSERT FOGARTY XLG (MISCELLANEOUS) ×2 IMPLANT
KIT BASIN OR (CUSTOM PROCEDURE TRAY) ×3 IMPLANT
KIT ROOM TURNOVER OR (KITS) ×3 IMPLANT
KIT SUCTION CATH 14FR (SUCTIONS) ×6 IMPLANT
KIT VASOVIEW W/TROCAR VH 2000 (KITS) ×3 IMPLANT
LEAD PACING MYOCARDI (MISCELLANEOUS) ×3 IMPLANT
MARKER GRAFT CORONARY BYPASS (MISCELLANEOUS) ×9 IMPLANT
MATRIX HEMOSTAT SURGIFLO (HEMOSTASIS) ×1 IMPLANT
NS IRRIG 1000ML POUR BTL (IV SOLUTION) ×15 IMPLANT
PACK OPEN HEART (CUSTOM PROCEDURE TRAY) ×3 IMPLANT
PAD ARMBOARD 7.5X6 YLW CONV (MISCELLANEOUS) ×6 IMPLANT
PENCIL BUTTON HOLSTER BLD 10FT (ELECTRODE) ×3 IMPLANT
PUNCH AORTIC ROT 5.0MM RCL 50 (MISCELLANEOUS) ×1 IMPLANT
PUNCH AORTIC ROTATE 4.0MM (MISCELLANEOUS) IMPLANT
PUNCH AORTIC ROTATE 4.5MM 8IN (MISCELLANEOUS) IMPLANT
PUNCH AORTIC ROTATE 5MM 8IN (MISCELLANEOUS) IMPLANT
SET CARDIOPLEGIA MPS 5001102 (MISCELLANEOUS) ×1 IMPLANT
SOLUTION ANTI FOG 6CC (MISCELLANEOUS) ×2 IMPLANT
SPONGE GAUZE 4X4 12PLY (GAUZE/BANDAGES/DRESSINGS) ×6 IMPLANT
SPONGE LAP 18X18 X RAY DECT (DISPOSABLE) ×9 IMPLANT
SPONGE LAP 4X18 X RAY DECT (DISPOSABLE) IMPLANT
SUT BONE WAX W31G (SUTURE) ×3 IMPLANT
SUT MNCRL AB 4-0 PS2 18 (SUTURE) ×4 IMPLANT
SUT PROLENE 3 0 SH DA (SUTURE) IMPLANT
SUT PROLENE 3 0 SH1 36 (SUTURE) ×5 IMPLANT
SUT PROLENE 4 0 RB 1 (SUTURE)
SUT PROLENE 4 0 SH DA (SUTURE) IMPLANT
SUT PROLENE 4 0 TF (SUTURE) ×6 IMPLANT
SUT PROLENE 4-0 RB1 .5 CRCL 36 (SUTURE) IMPLANT
SUT PROLENE 5 0 C 1 36 (SUTURE) IMPLANT
SUT PROLENE 6 0 C 1 30 (SUTURE) IMPLANT
SUT PROLENE 6 0 CC (SUTURE) ×10 IMPLANT
SUT PROLENE 7 0 BV 1 (SUTURE) IMPLANT
SUT PROLENE 7 0 BV1 MDA (SUTURE) ×15 IMPLANT
SUT PROLENE 7.0 RB 3 (SUTURE) IMPLANT
SUT PROLENE 8 0 BV175 6 (SUTURE) ×20 IMPLANT
SUT SILK  1 MH (SUTURE)
SUT SILK 1 MH (SUTURE) IMPLANT
SUT SILK 2 0 SH CR/8 (SUTURE) IMPLANT
SUT SILK 2 0SH CR/8 30 (SUTURE) ×2 IMPLANT
SUT SILK 3 0 SH CR/8 (SUTURE) IMPLANT
SUT STEEL 6MS V (SUTURE) ×3 IMPLANT
SUT STEEL STERNAL CCS#1 18IN (SUTURE) IMPLANT
SUT STEEL SZ 6 DBL 3X14 BALL (SUTURE) ×3 IMPLANT
SUT VIC AB 1 CTX 18 (SUTURE) ×6 IMPLANT
SUT VIC AB 1 CTX 36 (SUTURE)
SUT VIC AB 1 CTX36XBRD ANBCTR (SUTURE) IMPLANT
SUT VIC AB 2-0 CT1 27 (SUTURE)
SUT VIC AB 2-0 CT1 36 (SUTURE) ×1 IMPLANT
SUT VIC AB 2-0 CT1 TAPERPNT 27 (SUTURE) IMPLANT
SUT VIC AB 2-0 CTX 27 (SUTURE) IMPLANT
SUT VIC AB 3-0 SH 27 (SUTURE) ×6
SUT VIC AB 3-0 SH 27X BRD (SUTURE) ×4 IMPLANT
SUT VIC AB 3-0 X1 27 (SUTURE) IMPLANT
SUT VICRYL 4-0 PS2 18IN ABS (SUTURE) IMPLANT
SUTURE E-PAK OPEN HEART (SUTURE) ×3 IMPLANT
SYSTEM SAHARA CHEST DRAIN ATS (WOUND CARE) ×3 IMPLANT
TAPE CLOTH SURG 4X10 WHT LF (GAUZE/BANDAGES/DRESSINGS) ×1 IMPLANT
TOWEL OR 17X24 6PK STRL BLUE (TOWEL DISPOSABLE) ×6 IMPLANT
TOWEL OR 17X26 10 PK STRL BLUE (TOWEL DISPOSABLE) ×6 IMPLANT
TRAY FOLEY IC TEMP SENS 14FR (CATHETERS) ×3 IMPLANT
TUBE FEEDING 8FR 16IN STR KANG (MISCELLANEOUS) ×3 IMPLANT
TUBE SUCT INTRACARD DLP 20F (MISCELLANEOUS) ×3 IMPLANT
TUBING INSUFFLATION 10FT LAP (TUBING) ×3 IMPLANT
UNDERPAD 30X30 INCONTINENT (UNDERPADS AND DIAPERS) ×3 IMPLANT
WATER STERILE IRR 1000ML POUR (IV SOLUTION) ×6 IMPLANT

## 2013-01-09 NOTE — OR Nursing (Signed)
Second call to SICU, spoke with charge RN.

## 2013-01-09 NOTE — Anesthesia Procedure Notes (Addendum)
Procedure Name: Intubation Date/Time: 01/09/2013 8:05 AM Performed by: Margaree Mackintosh Pre-anesthesia Checklist: Patient identified, Timeout performed, Emergency Drugs available, Suction available and Patient being monitored Patient Re-evaluated:Patient Re-evaluated prior to inductionOxygen Delivery Method: Circle system utilized Preoxygenation: Pre-oxygenation with 100% oxygen Intubation Type: IV induction Ventilation: Mask ventilation without difficulty and Oral airway inserted - appropriate to patient size Laryngoscope Size: Mac and 3 Grade View: Grade I Tube type: Oral Tube size: 8.0 mm Number of attempts: 1 Airway Equipment and Method: Stylet Placement Confirmation: ETT inserted through vocal cords under direct vision,  positive ETCO2 and breath sounds checked- equal and bilateral Secured at: 23 cm Tube secured with: Tape Dental Injury: Teeth and Oropharynx as per pre-operative assessment

## 2013-01-09 NOTE — Progress Notes (Signed)
S/p CABG x 5  Just extubated  BP 100/54  Pulse 90  Temp(Src) 97.7 F (36.5 C) (Core (Comment))  Resp 17  Ht 5\' 7"  (1.702 m)  Wt 261 lb 8 oz (118.616 kg)  BMI 40.95 kg/m2  SpO2 94%   Intake/Output Summary (Last 24 hours) at 01/09/13 1913 Last data filed at 01/09/13 1800  Gross per 24 hour  Intake 6783.64 ml  Output   3565 ml  Net 3218.64 ml    Doing well early postop

## 2013-01-09 NOTE — Progress Notes (Signed)
Respiratory  Note- extubated to xopenex treatment.

## 2013-01-09 NOTE — Progress Notes (Signed)
  Amiodarone Drug - Drug Interaction Consult Note  Recommendations:  Amiodarone is metabolized by the cytochrome P450 system and therefore has the potential to cause many drug interactions. Amiodarone has an average plasma half-life of 50 days (range 20 to 100 days).   There is potential for drug interactions to occur several weeks or months after stopping treatment and the onset of drug interactions may be slow after initiating amiodarone.   [x]  Statins: Increased risk of myopathy. Simvastatin- restrict dose to 20mg  daily. Other statins: counsel patients to report any muscle pain or weakness immediately.  []  Anticoagulants: Amiodarone can increase anticoagulant effect. Consider warfarin dose reduction. Patients should be monitored closely and the dose of anticoagulant altered accordingly, remembering that amiodarone levels take several weeks to stabilize.  []  Antiepileptics: Amiodarone can increase plasma concentration of phenytoin, the dose should be reduced. Note that small changes in phenytoin dose can result in large changes in levels. Monitor patient and counsel on signs of toxicity.  [x]  Beta blockers: increased risk of bradycardia, AV block and myocardial depression. Sotalol - avoid concomitant use.  []   Calcium channel blockers (diltiazem and verapamil): increased risk of bradycardia, AV block and myocardial depression.  []   Cyclosporine: Amiodarone increases levels of cyclosporine. Reduced dose of cyclosporine is recommended.  []  Digoxin dose should be halved when amiodarone is started.  []  Diuretics: increased risk of cardiotoxicity if hypokalemia occurs.  [x]  Oral hypoglycemic agents (glyburide, glipizide, glimepiride): increased risk of hypoglycemia. Patient's glucose levels should be monitored closely when initiating amiodarone therapy.   [x]  Drugs that prolong the QT interval:  Torsades de pointes risk may be increased with concurrent use - avoid if possible.  Monitor QTc,  also keep magnesium/potassium WNL if concurrent therapy can't be avoided. Marland Kitchen Antibiotics: e.g. fluoroquinolones, erythromycin. . Antiarrhythmics: e.g. quinidine, procainamide, disopyramide, sotalol. . Antipsychotics: e.g. phenothiazines, haloperidol.  . Lithium, tricyclic antidepressants, and methadone.  Thank You,  [name]  01/09/2013 2:58 PM

## 2013-01-09 NOTE — Anesthesia Preprocedure Evaluation (Signed)
Anesthesia Evaluation  Patient identified by MRN, date of birth, ID band Patient awake    Airway Mallampati: II TM Distance: >3 FB     Dental  (+) Edentulous Upper and Edentulous Lower   Pulmonary  breath sounds clear to auscultation        Cardiovascular hypertension, Pt. on medications Rhythm:Regular Rate:Normal     Neuro/Psych    GI/Hepatic   Endo/Other  diabetes, Well Controlled, Type 2, Insulin Dependent and Oral Hypoglycemic AgentsMorbid obesity  Renal/GU      Musculoskeletal   Abdominal   Peds  Hematology   Anesthesia Other Findings   Reproductive/Obstetrics                           Anesthesia Physical Anesthesia Plan  ASA: IV  Anesthesia Plan: General   Post-op Pain Management:    Induction: Intravenous  Airway Management Planned: Oral ETT  Additional Equipment: Arterial line, PA Cath and TEE  Intra-op Plan:   Post-operative Plan: Post-operative intubation/ventilation  Informed Consent: I have reviewed the patients History and Physical, chart, labs and discussed the procedure including the risks, benefits and alternatives for the proposed anesthesia with the patient or authorized representative who has indicated his/her understanding and acceptance.   Dental advisory given  Plan Discussed with: CRNA, Anesthesiologist and Surgeon  Anesthesia Plan Comments:         Anesthesia Quick Evaluation

## 2013-01-09 NOTE — Transfer of Care (Signed)
Immediate Anesthesia Transfer of Care Note  Patient: [name]  Procedure(s) Performed: Procedure(s): CORONARY ARTERY BYPASS GRAFTING (CABG) (N/A) INTRAOPERATIVE TRANSESOPHAGEAL ECHOCARDIOGRAM (N/A)  Patient Location: ICU 2302  Anesthesia Type:General  Level of Consciousness: sedated  Airway & Oxygen Therapy: Patient remains intubated per anesthesia plan and Patient placed on Ventilator (see vital sign flow sheet for setting)  Post-op Assessment: Post -op Vital signs reviewed and stable and report to ICU RN 2302  Post vital signs: Reviewed and stable  Complications: No apparent anesthesia complications

## 2013-01-09 NOTE — Brief Op Note (Addendum)
01/02/2013 - 01/09/2013  12:19 PM  PATIENT:  [name]  62 y.o. male  PRE-OPERATIVE DIAGNOSIS:  1.NSTEMI 2.CAD (LEFT MAIN INCLUDED)  POST-OPERATIVE DIAGNOSIS:  1.NSTEMI 2.CAD (LEFT MAIN INCLUDED)   PROCEDURE:  INTRAOPERATIVE TRANSESOPHAGEAL ECHOCARDIOGRAM, CORONARY ARTERY BYPASS GRAFTING (CABG) x 5 (LIMA to LAD, SVG SEQUENTIALLY to DIAGONAL and OM1, SVG SEQUENTIALLY to DISTAL CIRCUMFLEX and RCA) with EVH from the RIGHT THIGH and LOWER LEG   SURGEON:  Surgeon(s) and Role:    * Delight Ovens, MD - Primary  PHYSICIAN ASSISTANT: Doree Fudge PA-C   ANESTHESIA:   general  EBL:  Total I/O In: 1000 [I.V.:1000] Out: 400 [Urine:400]  DRAINS: Chest Tube(s) in the Mediastinal and Pleural spaces   COUNTS CORRECT:  YES  DICTATION: .Dragon Dictation  PLAN OF CARE: Admit to inpatient   PATIENT DISPOSITION:  ICU - intubated and hemodynamically stable.   Delay start of Pharmacological VTE agent (>24hrs) due to surgical blood loss or risk of bleeding: yes  PRE OP WEIGHT: 119 kg

## 2013-01-09 NOTE — OR Nursing (Signed)
First call made to SICU, spoke with charge RN.

## 2013-01-09 NOTE — Procedures (Signed)
Extubation Procedure Note  Patient Details:   Name: [name] DOB: 05/22/1951 MRN: [id number]   Airway Documentation:  Airway 8 mm (Active)  Secured at (cm) 24 cm 01/09/2013  4:00 PM  Measured From Lips 01/09/2013  4:00 PM  Secured Location Right 01/09/2013  4:00 PM  Secured By Pink Tape 01/09/2013  4:00 PM  Cuff Pressure (cm H2O) 24 cm H2O 01/09/2013  2:20 PM  Site Condition Dry 01/09/2013  4:00 PM    Evaluation  O2 sats: stable throughout Complications: No apparent complications Patient did tolerate procedure well. Bilateral Breath Sounds: Clear;Other (Comment) (scattered coarse throughout)   Yes [id number] [id number] Cuff leak positive    Newt Lukes 01/09/2013, 6:58 PM

## 2013-01-09 NOTE — Progress Notes (Signed)
Respiratory therapy note- sp02 upon arrival 90%. Ventilator recruitment maneuver  Performed and now sp02 100%

## 2013-01-09 NOTE — Anesthesia Postprocedure Evaluation (Signed)
  Anesthesia Post-op Note  Patient: [name]  Procedure(s) Performed: Procedure(s): CORONARY ARTERY BYPASS GRAFTING (CABG) (N/A) INTRAOPERATIVE TRANSESOPHAGEAL ECHOCARDIOGRAM (N/A)  Patient Location: SICU  Anesthesia Type:General  Level of Consciousness: sedated and Patient remains intubated per anesthesia plan  Airway and Oxygen Therapy: Patient remains intubated per anesthesia plan and Patient placed on Ventilator (see vital sign flow sheet for setting)  Post-op Pain: none  Post-op Assessment: Post-op Vital signs reviewed  Post-op Vital Signs: Reviewed  Complications: No apparent anesthesia complications

## 2013-01-09 NOTE — Preoperative (Signed)
Beta Blockers   Reason not to administer Beta Blockers:Not Applicable 

## 2013-01-10 ENCOUNTER — Encounter (HOSPITAL_COMMUNITY): Payer: Self-pay | Admitting: Cardiothoracic Surgery

## 2013-01-10 ENCOUNTER — Inpatient Hospital Stay (HOSPITAL_COMMUNITY): Payer: Medicaid Other

## 2013-01-10 LAB — CBC
HCT: 27.2 % — ABNORMAL LOW (ref 39.0–52.0)
HCT: 25.5 % — ABNORMAL LOW (ref 39.0–52.0)
Hemoglobin: 8.9 g/dL — ABNORMAL LOW (ref 13.0–17.0)
MCH: 30.8 pg (ref 26.0–34.0)
MCHC: 35.3 g/dL (ref 30.0–36.0)
MCV: 87.2 fL (ref 78.0–100.0)
Platelets: 147 10*3/uL — ABNORMAL LOW (ref 150–400)
RDW: 14 % (ref 11.5–15.5)
RDW: 14.2 % (ref 11.5–15.5)
WBC: 17.3 10*3/uL — ABNORMAL HIGH (ref 4.0–10.5)

## 2013-01-10 LAB — GLUCOSE, CAPILLARY
Glucose-Capillary: 131 mg/dL — ABNORMAL HIGH (ref 70–99)
Glucose-Capillary: 134 mg/dL — ABNORMAL HIGH (ref 70–99)
Glucose-Capillary: 123 mg/dL — ABNORMAL HIGH (ref 70–99)
Glucose-Capillary: 121 mg/dL — ABNORMAL HIGH (ref 70–99)
Glucose-Capillary: 112 mg/dL — ABNORMAL HIGH (ref 70–99)
Glucose-Capillary: 90 mg/dL (ref 70–99)
Glucose-Capillary: 114 mg/dL — ABNORMAL HIGH (ref 70–99)
Glucose-Capillary: 165 mg/dL — ABNORMAL HIGH (ref 70–99)

## 2013-01-10 LAB — MAGNESIUM: Magnesium: 2.1 mg/dL (ref 1.5–2.5)

## 2013-01-10 LAB — CREATININE, SERUM
GFR calc Af Amer: >90 mL/min (ref >90)
GFR calc non Af Amer: >90 mL/min (ref >90)

## 2013-01-10 LAB — BASIC METABOLIC PANEL
BUN: 12 mg/dL (ref 6–23)
Calcium: 8 mg/dL — ABNORMAL LOW (ref 8.4–10.5)
Chloride: 104 mEq/L (ref 96–112)
Creatinine, Ser: 0.83 mg/dL (ref 0.50–1.35)
GFR calc Af Amer: >90 mL/min (ref >90)
GFR calc non Af Amer: >90 mL/min (ref >90)

## 2013-01-10 LAB — POCT I-STAT, CHEM 8
BUN: 13 mg/dL (ref 6–23)
Calcium, Ion: 1.11 mmol/L — ABNORMAL LOW (ref 1.13–1.30)
Chloride: 104 mEq/L (ref 96–112)
Creatinine, Ser: 0.9 mg/dL (ref 0.50–1.35)
Glucose, Bld: 168 mg/dL — ABNORMAL HIGH (ref 70–99)
TCO2: 19 mmol/L (ref 0–100)

## 2013-01-10 MED ORDER — INSULIN ASPART 100 UNIT/ML ~~LOC~~ SOLN
0.0000 [IU] | SUBCUTANEOUS | Status: DC
  Administered 2013-01-10: 4 [IU] via SUBCUTANEOUS

## 2013-01-10 MED ORDER — AMIODARONE HCL 200 MG PO TABS
200.0000 mg | ORAL_TABLET | Freq: Two times a day (BID) | ORAL | Status: DC
  Administered 2013-01-10 – 2013-01-19 (×19): 200 mg via ORAL
  Filled 2013-01-10 (×20): qty 1

## 2013-01-10 MED ORDER — INSULIN DETEMIR 100 UNIT/ML ~~LOC~~ SOLN
20.0000 [IU] | Freq: Every day | SUBCUTANEOUS | Status: DC
  Administered 2013-01-10: 20 [IU] via SUBCUTANEOUS
  Filled 2013-01-10 (×2): qty 0.2

## 2013-01-10 MED ORDER — POTASSIUM CHLORIDE 10 MEQ/50ML IV SOLN
10.0000 meq | INTRAVENOUS | Status: AC | PRN
  Administered 2013-01-10 (×2): 10 meq via INTRAVENOUS

## 2013-01-10 MED ORDER — ENOXAPARIN SODIUM 30 MG/0.3ML ~~LOC~~ SOLN
30.0000 mg | Freq: Every day | SUBCUTANEOUS | Status: DC
  Administered 2013-01-10 – 2013-01-18 (×9): 30 mg via SUBCUTANEOUS
  Filled 2013-01-10 (×10): qty 0.3

## 2013-01-10 MED ORDER — FUROSEMIDE 10 MG/ML IJ SOLN
40.0000 mg | Freq: Once | INTRAMUSCULAR | Status: AC
  Administered 2013-01-10: 40 mg via INTRAVENOUS
  Filled 2013-01-10: qty 4

## 2013-01-10 MED ORDER — AMIODARONE HCL IN DEXTROSE 360-4.14 MG/200ML-% IV SOLN
INTRAVENOUS | Status: AC
  Administered 2013-01-10: 30 mg/h via INTRAVENOUS
  Filled 2013-01-10: qty 200

## 2013-01-10 MED ORDER — POTASSIUM CHLORIDE 10 MEQ/50ML IV SOLN
INTRAVENOUS | Status: AC
  Administered 2013-01-10: 10 meq via INTRAVENOUS
  Filled 2013-01-10: qty 150

## 2013-01-10 MED FILL — Magnesium Sulfate Inj 50%: INTRAMUSCULAR | Qty: 10 | Status: AC

## 2013-01-10 MED FILL — Potassium Chloride Inj 2 mEq/ML: INTRAVENOUS | Qty: 40 | Status: AC

## 2013-01-10 NOTE — Op Note (Signed)
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  SURGICAL PROCEDURE:  Coronary artery bypass grafting x5 with the left internal mammary to the left anterior descending coronary artery, sequential reverse saphenous vein graft to the diagonal coronary artery and first obtuse marginal coronary artery, reverse saphenous vein graft sequentially to the distal right coronary artery and distal circumflex with right leg and thigh and calf endo vein harvesting.  SURGEON:  Sheliah Plane, MD  FIRST ASSISTANT:  [name] Fudge, PA  BRIEF HISTORY:  The patient is a 62 year old male with a previous history of carotid occlusion and stroke with significant physical limitations including severe contracture of his left arm.  The patient was admitted with shortness of breath and acute onset of atrial fibrillation.  He was stabilized medically, underwent cardiac catheterization by Dr. Clifton [name] which demonstrated 70% left main, 80% obstructions of the diagonal, first obtuse marginal.  The distal circumflex was totally occluded and filled by collaterals.  The right was also totally occluded, filled by collaterals.  His left ventricular function was depressed with ejection fraction of approximately 30-35%. The patient had been on Plavix because of the previous stroke.  In spite of his physical limitations with the left main obstruction and severe 3- vessel disease, it was felt that high risk coronary artery bypass grafting was indicated for preservation of life and LV  function. Risks and options were discussed with the patient and his family in detail.  He agreed and signed informed consent.  DESCRIPTION OF PROCEDURE:  With Swan-Ganz and arterial line monitors in place, the patient underwent general endotracheal anesthesia without incident.  Dr. Ivin [name] placed a TEE probe, which showed depressed LV function as noted but without significant mitral regurgitation.  At the time of surgery, the patient was in sinus rhythm.  The skin of the chest and legs were prepped with Betadine and draped in usual sterile manner. After the patient was intubated, the left arm, which could be straightened some and was padded, positioned to prevent any further neurologic injury.  We then proceeded with right thigh and calf endo vein harvesting and obtained 2 usable portions of saphenous vein for bypass.  Vein was somewhat large but was felt of good quality.  Median sternotomy was performed.  Left internal mammary artery was dissected down as pedicle graft.  The distal artery was divided, had good free flow.  Pericardium was opened.  As noted, the patient's overall LV function was depressed, especially inferiorly.  He was systemically heparinized.  The ascending aorta was cannulated.  The right atrium was cannulated.  An aortic root vent cardioplegia needle was introduced into the ascending aorta.  The patient was placed on cardiopulmonary bypass 2.4 liters/minute/sq. m.  Sites of anastomosis were selected and dissected out  of the epicardium.  The patient's body temperature was cooled to 32 degrees.  Aortic crossclamp was applied, 500 mL of cold blood potassium cardioplegia was administered with diastolic arrest of the heart.  Myocardial septal temperatures was monitored throughout the crossclamp.  Sites of anastomosis were selected.  The patient's body temperature was cooled to 32 degrees.  Aortic crossclamp was applied, 500 mL of cold blood potassium cardioplegia was  administered with diastolic arrest of the heart.  Myocardial septal temperature was monitored throughout the crossclamp.  Attention was turned first to the distal right coronary artery which was a small vessel.  Admitted a 1 mm probe.  The vessel was 1.3 mm in size.  A diamond-type, side-to-side anastomosis was carried out with a running 7-0 Prolene.  Distal extent of the same vein was then carried to the much larger totally occluded distal circumflex, which was the predominant supplier of the inferior wall of the vessel.  Admitted a 1.5 mm probe.  Using a running 7-0 Prolene, distal anastomosis was performed.  The heart was then elevated and the largest branch of the diagonal was opened and admitted a 1 mm probe.  The vessel was approximately 1.3 mm in size but very thin walled.  Using a running 8-0 Prolene, side-to-side anastomosis was carried out.  Distal extent of same vein was then carried to the distal first obtuse marginal.  The proximal portion of this vessel was intramyocardial.  Vessel was of reasonable size, 1.3-1.4 mm in size. Using running 8-0 Prolene, distal anastomosis was performed.  Attention was then turned to the mid left anterior descending coronary artery which was opened and admitted a 1.5 mm probe.  Using running 8-0 Prolene, left internal mammary artery was anastomosed to the left anterior descending coronary artery.  With release of the [name] bulldog on the mammary artery, there was rise in myocardial septal temperature.  Bulldog was placed back on the mammary artery.  With the crossclamp still in place, 2 punch aortotomies were performed.  Each of the 2 vein grafts were anastomosed to the ascending aorta.  Air was evacuated from the grafts and ascending aorta.  The bulldog was removed from the mammary artery with prompt rise in myocardial septal temperature.  The aortic crossclamp was then removed with a total crossclamp time of 102 minutes.  The patient  spontaneously converted to a sinus rhythm in the 70s.  He was atrially paced, increased the rate. Sites of anastomosis were inspected, free of bleeding.  With the patient on milrinone and dopamine infusion, he was then ventilated and weaned from cardiopulmonary bypass without difficulty.  He remained hemodynamically stable.  He was decannulated in usual fashion. Protamine sulfate was administered with operative field hemostatic. Atrial and ventricular pacing wires had been applied.  A left pleural tube and a Blake mediastinal drain were left in place.  Pericardium was loosely reapproximated.  Sternum was closed with #6 stainless steel wire.  Fascia closed with interrupted 0 Vicryl, running 3-0 Vicryl in subcutaneous tissue, and 4-0 subcuticular stitch in skin edges.  Dry dressings were applied.  Sponge and needle count was reported as correct at completion of procedure.  The patient tolerated the procedure without obvious complication and was transferred to Surgical Intensive Care Unit for further postop care.  The patient did not require any blood bank blood products during the operative procedure.     Sheliah Plane, MD     EG/MEDQ  D:  01/10/2013  T:  01/10/2013  Job:  [id number]  cc:   Verne Carrow, MD
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
## 2013-01-10 NOTE — Progress Notes (Signed)
Have educated [name] several times through out the night about not pulling himself around in the bed by yanking on the side rails. Explained the importance of protecting the integrity of his chest incision. He states that he understands, but continues to pull on side rails at times. Will continue to educated him as needed. Thresa Ross RN

## 2013-01-10 NOTE — Progress Notes (Signed)
2000 CBG 184. Restarted insulin drip per protocol. Thresa Ross RN

## 2013-01-10 NOTE — Progress Notes (Addendum)
T. CTS p.m. Rounds  Patient examined and record reviewed.Hemodynamics stable,labs satisfactory but blood sugar greater than 180, will resume insulin drip. Patient had stable day.Continue current care. VAN TRIGT III,Jeno Calleros 01/10/2013

## 2013-01-10 NOTE — Progress Notes (Signed)
Patient ID: [name], male   DOB: 1951/08/08, 62 y.o.   MRN: [id number] TCTS DAILY PROGRESS NOTE                   [address]            [name] [address]          [phone]      1 Day Post-Op Procedure(s) (LRB): CORONARY ARTERY BYPASS GRAFTING (CABG) (N/A) INTRAOPERATIVE TRANSESOPHAGEAL ECHOCARDIOGRAM (N/A)  Total Length of Stay:  LOS: 8 days   Subjective: Extubated, neuro at base line  Objective: Vital signs in last 24 hours: Temp:  [95.7 F (35.4 C)-101.5 F (38.6 C)] 101.5 F (38.6 C) (04/01 0700) Pulse Rate:  [82-114] 105 (04/01 0745) Cardiac Rhythm:  [-] Sinus tachycardia (04/01 0700) Resp:  [12-23] 21 (04/01 0745) BP: (92-126)/(39-64) 116/53 mmHg (04/01 0745) SpO2:  [88 %-100 %] 96 % (04/01 0745) Arterial Line BP: (76-113)/(56-72) 103/57 mmHg (04/01 0700) FiO2 (%):  [40 %-50 %] 40 % (03/31 1820) Weight:  [266 lb 9.6 oz (120.929 kg)] 266 lb 9.6 oz (120.929 kg) (04/01 0412)  Filed Weights   01/08/13 0500 01/09/13 0517 01/10/13 0412  Weight: 258 lb 3.2 oz (117.119 kg) 261 lb 8 oz (118.616 kg) 266 lb 9.6 oz (120.929 kg)    Weight change: 5 lb 1.6 oz (2.313 kg)   Hemodynamic parameters for last 24 hours: PAP: (32-52)/(16-32) 32/17 mmHg CO:  [5.5 L/min-9.6 L/min] 9.6 L/min CI:  [2.4 L/min/m2-4.2 L/min/m2] 4.2 L/min/m2  Intake/Output from previous day: 03/31 0701 - 04/01 0700 In: 9540.1 [P.O.:60; I.V.:7500.1; Blood:500; NG/GT:30; IV Piggyback:1450] Out: 4825 [Urine:3305; Emesis/NG output:20; Blood:1000; Chest Tube:500]  Intake/Output this shift:    Current Meds: Scheduled Meds: . acetaminophen  1,000 mg Oral Q6H   Or  . acetaminophen (TYLENOL) oral liquid 160 mg/5 mL  975 mg Per Tube Q6H  . aspirin EC  325 mg Oral Daily   Or  . aspirin  324 mg Per Tube Daily  . atorvastatin  20 mg Oral QHS  . bisacodyl  10 mg Oral Daily   Or  . bisacodyl  10 mg Rectal Daily  . cefUROXime (ZINACEF)  IV  1.5 g Intravenous Q12H  .  dexmedetomidine  0.1-0.7 mcg/kg/hr Intravenous To OR  . docusate sodium  200 mg Oral Daily  . famotidine (PEPCID) IV  20 mg Intravenous Q12H  . finasteride  5 mg Oral Daily  . gabapentin  100 mg Oral TID  . insulin regular  0-10 Units Intravenous TID WC  . levalbuterol  0.63 mg Nebulization TID  . loratadine  10 mg Oral Daily  . metoCLOPramide (REGLAN) injection  10 mg Intravenous Q6H  . metoprolol tartrate  12.5 mg Oral BID   Or  . metoprolol tartrate  12.5 mg Per Tube BID  . [START ON 01/11/2013] pantoprazole  40 mg Oral Daily  . PARoxetine  20 mg Oral Daily  . rOPINIRole  0.25 mg Oral QHS  . sodium chloride  3 mL Intravenous Q12H   Continuous Infusions: . sodium chloride 20 mL/hr at 01/09/13 2000  . sodium chloride    . sodium chloride    . amiodarone (NEXTERONE PREMIX) 360 mg/200 mL dextrose 30 mg/hr (01/09/13 2100)  . dexmedetomidine Stopped (01/09/13 1730)  . DOPamine 3 mcg/kg/min (01/09/13 2000)  . insulin (NOVOLIN-R) infusion 6.3 Units/hr (01/10/13 0626)  . lactated ringers 40 mL/hr at 01/09/13 2000  . milrinone 0.375 mcg/kg/min (01/10/13 0238)  .  nitroGLYCERIN    . phenylephrine (NEO-SYNEPHRINE) Adult infusion 55 mcg/min (01/10/13 0635)   PRN Meds:.albumin human, fentaNYL, metoprolol, midazolam, ondansetron (ZOFRAN) IV, oxyCODONE, potassium chloride, sodium chloride  General appearance: alert and cooperative Neurologic: intact Heart: regular rate and rhythm, S1, S2 normal, no murmur, click, rub or gallop Lungs: diminished breath sounds bilaterally Abdomen: soft, non-tender; bowel sounds normal; no masses,  no organomegaly Extremities: extremities normal, atraumatic, no cyanosis or edema, Homans sign is negative, no sign of DVT and left arm ok Wound: sternum stable  Lab Results: CBC: Recent Labs  01/09/13 2000 01/09/13 2019 01/10/13 0355  WBC 17.1*  --  18.1*  HGB 9.7* 9.5* 9.6*  HCT 27.6* 28.0* 27.2*  PLT 157  --  147*   BMET:  Recent Labs  01/08/13 0505   01/09/13 2019 01/10/13 0355  NA 138  < > 140 137  K 4.1  < > 3.8 3.6  CL 104  --  106 104  CO2 23  --   --  22  GLUCOSE 110*  < > 170* 118*  BUN 12  --  9 12  CREATININE 0.78  < > 0.80 0.83  CALCIUM 8.7  --   --  8.0*  < > = values in this interval not displayed.  PT/INR:  Recent Labs  01/09/13 1440  LABPROT 18.5*  INR 1.59*   Radiology: Dg Chest Portable 1 View  01/09/2013  *RADIOLOGY REPORT*  Clinical Data: Postop CABG  PORTABLE CHEST - 1 VIEW  Comparison: 01/05/2013  Findings: 1456 hours.  Endotracheal tube tip is approximately 2.8 cm above the base of the carina.  The right IJ pulmonary artery catheter tip is in the right main pulmonary artery.  NG tube tip overlies the proximal stomach. Drainage tube overlies the left heart. The cardiopericardial silhouette is enlarged.  Left base atelectasis noted.  No overt pulmonary edema.  IMPRESSION: Cardiomegaly with low volumes of mild vascular congestion.   Original Report Authenticated By: [name], M.D.      Assessment/Plan: S/P Procedure(s) (LRB): CORONARY ARTERY BYPASS GRAFTING (CABG) (N/A) INTRAOPERATIVE TRANSESOPHAGEAL ECHOCARDIOGRAM (N/A) Mobilize Diuresis Diabetes control d/c tubes/lines See progression orders Expected Acute  Blood - loss Anemia      [name] B 01/10/2013 7:53 AM

## 2013-01-10 NOTE — Progress Notes (Addendum)
Clinical Social Work Department CLINICAL SOCIAL WORK PLACEMENT NOTE 01/10/2013  Patient:  [name], [name]  Account Number:  [id number] Admit date:  01/02/2013  Clinical Social Worker:  Salomon Fick, LCSW  Date/time:  01/10/2013 04:00 PM  Clinical Social Work is seeking post-discharge placement for this patient at the following level of care:   SKILLED NURSING   (*CSW will update this form in Epic as items are completed)   01/10/2013  Patient/family provided with [facility] Department of Clinical Social Work's list of facilities offering this level of care within the geographic area requested by the patient (or if unable, by the patient's family).  01/10/2013  Patient/family informed of their freedom to choose among providers that offer the needed level of care, that participate in Medicare, Medicaid or managed care program needed by the patient, have an available bed and are willing to accept the patient.  01/10/2013  Patient/family informed of [facility]' ownership interest in [facility], as well as of the fact that they are under no obligation to receive care at this facility.  PASARR submitted to EDS on 01/10/2013 PASARR number received from EDS on 01/10/2013  FL2 transmitted to all facilities in geographic area requested by pt/family on  01/16/2013 FL2 transmitted to all facilities within larger geographic area on 01/16/2013 [id number] A   Patient informed that his/her managed care company has contracts with or will negotiate with  certain facilities, including the following:     Patient/family informed of bed offers received:  01/16/13 Patient chooses bed at [facility] Physician recommends and patient chooses bed at    Patient to be transferred to [facility] on   Patient to be transferred to facility by   The following physician request were entered in Epic:   Additional Comments:

## 2013-01-10 NOTE — Evaluation (Signed)
Physical Therapy Evaluation Patient Details Name: [name] MRN: [id number] DOB: [birth year] Today's Date: 01/10/2013 Time: 4098-1191 PT Time Calculation (min): 32 min  PT Assessment / Plan / Recommendation Clinical Impression  Pt is a 62 y.o. male s/p CABGx5. Patient with increased deficits in functional mobility at this time secondary to pain, weakness, deconditioning, sternal precautions and other comorbidities (prior cva and TBI). Patient with significant difficulty maintaining compliance with sternal precautions, will need continuous education and training on transfers and mobility without UE use.  Pt will benefit from skilled PT in an acute setting with recommendation of continued skilled PT upon discharge pending progress.  Will continue to see to address deficits and maximize education regarding mobility and precautions.    PT Assessment  Patient needs continued PT services    Follow Up Recommendations  CIR (If not CIR appropriate then pt may need SNF)    Does the patient have the potential to tolerate intense rehabilitation      Barriers to Discharge        Equipment Recommendations  None recommended by PT;Other (comment)    Recommendations for Other Services OT consult   Frequency Min 4X/week    Precautions / Restrictions Precautions Precautions: Sternal;Fall Restrictions Weight Bearing Restrictions: Yes (pt needs continued reminder for sternal precaution )   Pertinent Vitals/Pain 10/10; Elevated HR (110s at rest 120s to 130s with activity; BP 116/71; 93/61 post standing trial; 113/69 with rest; 107/52 during second standing trial)       Mobility  Bed Mobility Bed Mobility: Not assessed Transfers Transfers: Sit to Stand;Stand to Sit Sit to Stand: 1: +2 Total assist;From chair/3-in-1 Sit to Stand: Patient Percentage: 40% Stand to Sit: 1: +2 Total assist;To chair/3-in-1 Stand to Sit: Patient Percentage: 60% Details for Transfer Assistance: Max VCs for sternal  precaution, pt non-compliant; still trying to pull on therapy hands to stand despite being directed to hug pillow. Performed transfer x3; Ambulation/Gait Ambulation/Gait Assistance: Not tested (comment) (pt not appropriate to ambulate at this time)    Exercises General Exercises - Lower Extremity Hip Flexion/Marching: AROM;Strengthening;Both;5 reps;Standing (+2 max assist while performing; pt with difficulty balancing)   PT Diagnosis: Difficulty walking;Abnormality of gait;Generalized weakness;Acute pain  PT Problem List: Decreased strength;Decreased range of motion;Decreased activity tolerance;Decreased balance;Decreased mobility;Decreased coordination;Decreased safety awareness;Decreased knowledge of precautions;Cardiopulmonary status limiting activity;Impaired sensation;Impaired tone;Pain PT Treatment Interventions: DME instruction;Gait training;Functional mobility training;Therapeutic activities;Balance training;Patient/family education   PT Goals Acute Rehab PT Goals PT Goal Formulation: With patient Time For Goal Achievement: 01/18/13 Potential to Achieve Goals: Good Pt will go Supine/Side to Sit: with supervision;Other (comment) (With compliance to sternal precautions) PT Goal: Supine/Side to Sit - Progress: Goal set today Pt will go Stand to Sit: with modified independence PT Goal: Stand to Sit - Progress: Goal set today Pt will Transfer Bed to Chair/Chair to Bed: with modified independence PT Transfer Goal: Bed to Chair/Chair to Bed - Progress: Goal set today Pt will Ambulate: >150 feet;with supervision;with least restrictive assistive device PT Goal: Ambulate - Progress: Goal set today  Visit Information  Last PT Received On: 01/10/13 Assistance Needed: +2    Subjective Data  Subjective: I really need to sit up Patient Stated Goal: to go home   Prior Functioning  Home Living Lives With: Spouse Available Help at Discharge: Family Type of Home: House Home Access: Level  entry Home Layout: One level Bathroom Shower/Tub: Health visitor: Standard Home Adaptive Equipment: Shower chair without back;Straight cane Prior Function Level of Independence:  Needs assistance Needs Assistance: Bathing;Dressing;Grooming;Light Housekeeping;Meal Prep Driving: No    Cognition  Cognition Overall Cognitive Status: Impaired Area of Impairment: Safety/judgement Arousal/Alertness: Awake/alert Behavior During Session: WFL for tasks performed Safety/Judgement: Impulsive Cognition - Other Comments: h/o TBI and CVA    Extremity/Trunk Assessment Right Upper Extremity Assessment RUE ROM/Strength/Tone: Within functional levels Left Upper Extremity Assessment LUE ROM/Strength/Tone: Deficits LUE ROM/Strength/Tone Deficits: paretic Right Lower Extremity Assessment RLE ROM/Strength/Tone: Within functional levels RLE Sensation: WFL - Light Touch RLE Coordination: WFL - gross/fine motor Left Lower Extremity Assessment LLE ROM/Strength/Tone: WFL for tasks assessed   Balance Static Sitting Balance Static Sitting - Balance Support: Feet supported;No upper extremity supported Static Sitting - Level of Assistance: 3: Mod assist Static Sitting - Comment/# of Minutes: Pt with difficulty maintaining upright, posterior lean requires assist to maintain  End of Session PT - End of Session Equipment Utilized During Treatment: Gait belt Activity Tolerance: Patient limited by fatigue;Patient limited by pain Patient left: in chair;with call bell/phone within reach;with nursing in room Nurse Communication: Mobility status  GP     Fabio Asa 01/10/2013, 3:41 PM Charlotte Crumb, PT DPT  [phone]

## 2013-01-10 NOTE — Progress Notes (Signed)
Clinical Social Work Department BRIEF PSYCHOSOCIAL ASSESSMENT 01/10/2013  Patient:  [name], [name]     Account Number:  [id number]     Admit date:  01/02/2013  Clinical Social Worker:  Madaline Guthrie  Date/Time:  01/10/2013 03:57 PM  Referred by:  Care Management  Date Referred:  01/10/2013 Referred for  SNF Placement   Other Referral:   Interview type:  Patient Other interview type:    PSYCHOSOCIAL DATA Living Status:  WIFE Admitted from facility:   Level of care:   Primary support name:  Birdena Crandall Primary support relationship to patient:  SIBLING Degree of support available:   Pt lives at home with wife who will help him once discharged from SNF    CURRENT CONCERNS Current Concerns  None Noted   Other Concerns:    SOCIAL WORK ASSESSMENT / PLAN CSW met with pt who was laying in bed. Prior to hospitalization, patient was living at home independently with his wife in [place]. CSW talked to patient about needing SNF before returning home. Pt understood and agreed, but requested that he not be sent to [facility]. CSW talked to patient about the SNF process and will present with bed offers once received.   Assessment/plan status:  Psychosocial Support/Ongoing Assessment of Needs Other assessment/ plan:   SNF placement   Information/referral to community resources:    PATIENTS/FAMILYS RESPONSE TO PLAN OF CARE: Pt agreed that he would need SNF before going home to his wife.

## 2013-01-11 ENCOUNTER — Inpatient Hospital Stay (HOSPITAL_COMMUNITY): Payer: Medicaid Other

## 2013-01-11 LAB — BASIC METABOLIC PANEL
BUN: 15 mg/dL (ref 6–23)
CO2: 23 mEq/L (ref 19–32)
Calcium: 7.5 mg/dL — ABNORMAL LOW (ref 8.4–10.5)
Chloride: 105 mEq/L (ref 96–112)
Creatinine, Ser: 0.92 mg/dL (ref 0.50–1.35)
GFR calc Af Amer: >90 mL/min (ref >90)
GFR calc non Af Amer: 90 mL/min — ABNORMAL LOW (ref >90)
Glucose, Bld: 98 mg/dL (ref 70–99)
Potassium: 3.9 mEq/L (ref 3.5–5.1)
Sodium: 136 mEq/L (ref 135–145)

## 2013-01-11 LAB — CBC
HCT: 22.5 % — ABNORMAL LOW (ref 39.0–52.0)
Hemoglobin: 8 g/dL — ABNORMAL LOW (ref 13.0–17.0)
MCH: 31.3 pg (ref 26.0–34.0)
MCHC: 35.6 g/dL (ref 30.0–36.0)
MCV: 87.9 fL (ref 78.0–100.0)
Platelets: 99 10*3/uL — ABNORMAL LOW (ref 150–400)
RBC: 2.56 MIL/uL — ABNORMAL LOW (ref 4.22–5.81)
RDW: 14.9 % (ref 11.5–15.5)
WBC: 13.3 10*3/uL — ABNORMAL HIGH (ref 4.0–10.5)

## 2013-01-11 LAB — GLUCOSE, CAPILLARY
Glucose-Capillary: 97 mg/dL (ref 70–99)
Glucose-Capillary: 86 mg/dL (ref 70–99)
Glucose-Capillary: 91 mg/dL (ref 70–99)
Glucose-Capillary: 123 mg/dL — ABNORMAL HIGH (ref 70–99)
Glucose-Capillary: 146 mg/dL — ABNORMAL HIGH (ref 70–99)
Glucose-Capillary: 135 mg/dL — ABNORMAL HIGH (ref 70–99)

## 2013-01-11 MED ORDER — FUROSEMIDE 10 MG/ML IJ SOLN: 40.0000 mg | Freq: Once | INTRAMUSCULAR | Status: DC

## 2013-01-11 MED ORDER — FUROSEMIDE 10 MG/ML IJ SOLN
40.0000 mg | Freq: Once | INTRAMUSCULAR | Status: AC
  Administered 2013-01-11: 40 mg via INTRAVENOUS
  Filled 2013-01-11: qty 4

## 2013-01-11 MED ORDER — INSULIN ASPART 100 UNIT/ML ~~LOC~~ SOLN
0.0000 [IU] | SUBCUTANEOUS | Status: AC
  Administered 2013-01-11: 2 [IU] via SUBCUTANEOUS

## 2013-01-11 MED ORDER — INSULIN ASPART 100 UNIT/ML ~~LOC~~ SOLN
0.0000 [IU] | SUBCUTANEOUS | Status: DC
  Administered 2013-01-11 (×3): 2 [IU] via SUBCUTANEOUS

## 2013-01-11 MED ORDER — METOPROLOL TARTRATE 12.5 MG HALF TABLET
12.5000 mg | ORAL_TABLET | Freq: Two times a day (BID) | ORAL | Status: DC
  Administered 2013-01-11: 12.5 mg via ORAL
  Filled 2013-01-11 (×4): qty 1

## 2013-01-11 MED ORDER — POTASSIUM CHLORIDE CRYS ER 20 MEQ PO TBCR: 20.0000 meq | EXTENDED_RELEASE_TABLET | Freq: Once | ORAL | Status: DC

## 2013-01-11 MED ORDER — METOPROLOL TARTRATE 25 MG/10 ML ORAL SUSPENSION
25.0000 mg | Freq: Two times a day (BID) | ORAL | Status: DC
  Administered 2013-01-11: 25 mg
  Filled 2013-01-11 (×4): qty 10

## 2013-01-11 MED ORDER — INSULIN DETEMIR 100 UNIT/ML ~~LOC~~ SOLN
22.0000 [IU] | Freq: Every day | SUBCUTANEOUS | Status: DC
  Administered 2013-01-11 – 2013-01-17 (×7): 22 [IU] via SUBCUTANEOUS
  Filled 2013-01-11 (×9): qty 0.22

## 2013-01-11 MED ORDER — POLYSACCHARIDE IRON COMPLEX 150 MG PO CAPS
150.0000 mg | ORAL_CAPSULE | Freq: Every day | ORAL | Status: DC
  Administered 2013-01-11 – 2013-01-19 (×9): 150 mg via ORAL
  Filled 2013-01-11 (×9): qty 1

## 2013-01-11 MED ORDER — POTASSIUM CHLORIDE CRYS ER 20 MEQ PO TBCR
30.0000 meq | EXTENDED_RELEASE_TABLET | Freq: Once | ORAL | Status: AC
  Administered 2013-01-11: 30 meq via ORAL
  Filled 2013-01-11: qty 1

## 2013-01-11 MED ORDER — POTASSIUM CHLORIDE CRYS ER 20 MEQ PO TBCR
20.0000 meq | EXTENDED_RELEASE_TABLET | Freq: Once | ORAL | Status: AC
  Administered 2013-01-11: 20 meq via ORAL
  Filled 2013-01-11: qty 1

## 2013-01-11 MED FILL — Sodium Chloride IV Soln 0.9%: INTRAVENOUS | Qty: 1000 | Status: AC

## 2013-01-11 MED FILL — Heparin Sodium (Porcine) Inj 1000 Unit/ML: INTRAMUSCULAR | Qty: 20 | Status: AC

## 2013-01-11 MED FILL — Heparin Sodium (Porcine) Inj 1000 Unit/ML: INTRAMUSCULAR | Qty: 60 | Status: AC

## 2013-01-11 MED FILL — Sodium Bicarbonate IV Soln 8.4%: INTRAVENOUS | Qty: 50 | Status: AC

## 2013-01-11 MED FILL — Lidocaine HCl IV Inj 20 MG/ML: INTRAVENOUS | Qty: 5 | Status: AC

## 2013-01-11 MED FILL — Sodium Chloride Irrigation Soln 0.9%: Qty: 3000 | Status: AC

## 2013-01-11 MED FILL — Electrolyte-R (PH 7.4) Solution: INTRAVENOUS | Qty: 4000 | Status: AC

## 2013-01-11 MED FILL — Mannitol IV Soln 20%: INTRAVENOUS | Qty: 500 | Status: AC

## 2013-01-11 NOTE — Progress Notes (Signed)
Physical Therapy Treatment Patient Details Name: [name] MRN: [id number] DOB: Sep 12, 1951 Today's Date: 01/11/2013 Time: 4696-2952 PT Time Calculation (min): 29 min  PT Assessment / Plan / Recommendation Comments on Treatment Session  Pt demonstrates modest improvements in activity tolerance and functional mobility today.  Pt still requiring +2 assist for pre-gait and gait activities. Pt vitals monitored, HR began to increase with fatigue as indicated. Will continue to see and progress activity as tolerated.     Follow Up Recommendations  CIR (If not CIR appropriate then pt may need SNF)     Does the patient have the potential to tolerate intense rehabilitation     Barriers to Discharge        Equipment Recommendations  None recommended by PT;Other (comment)    Recommendations for Other Services OT consult  Frequency Min 3X/week   Plan Discharge plan remains appropriate    Precautions / Restrictions Precautions Precautions: Sternal;Fall Restrictions Weight Bearing Restrictions: Yes (pt needs continued reminder for sternal precaution )   Pertinent Vitals/Pain 8/10 pain; patient pre medicated; Vitals pre activity 119/85 97% HR92; during standing 119/68 93% HR101; Highest HR with fatigue 127    Mobility  Bed Mobility Bed Mobility: Sit to Supine Sit to Supine: 3: Mod assist Details for Bed Mobility Assistance: Continued VCs for sternal precautions; pt with poor control and impulsivity with mobility Transfers Transfers: Sit to Stand;Stand to Sit Sit to Stand: 1: +2 Total assist;From bed;From chair/3-in-1 Sit to Stand: Patient Percentage: 70% Stand to Sit: 1: +2 Total assist;To bed;To chair/3-in-1 Stand to Sit: Patient Percentage: 80% Details for Transfer Assistance: Max VCs for sternal precaution, pt non-compliant; still trying to pull on therapy hands to stand despite being directed to hug pillow. Performed transfer x2 from varying surfaces; chair and  bed Ambulation/Gait Ambulation/Gait Assistance: 1: +2 Total assist Ambulation/Gait: Patient Percentage: 70% Ambulation Distance (Feet): 7 Feet Assistive device: 2 person hand held assist Ambulation/Gait Assistance Details: Pt did not have KAFO brace during session. Pt with increased tone LLE creating posterior push back. Patient with slow staggering shuffle and narrow bos during pivot.  Gait Pattern: Decreased weight shift to left;Shuffle;Narrow base of support General Gait Details: PT very unsteady; requires bilateral assist for stability Stairs: No    Exercises Total Joint Exercises Straight Leg Raises: AROM;Strengthening;Both;5 reps General Exercises - Lower Extremity Hip Flexion/Marching:  (+2 max assist while performing; pt with difficulty balancing)   PT Diagnosis:    PT Problem List:   PT Treatment Interventions:     PT Goals Acute Rehab PT Goals PT Goal Formulation: With patient Time For Goal Achievement: 01/18/13 Potential to Achieve Goals: Good Pt will go Supine/Side to Sit: with supervision;Other (comment) (With compliance to sternal precautions) PT Goal: Supine/Side to Sit - Progress: Progressing toward goal Pt will go Stand to Sit: with modified independence PT Goal: Stand to Sit - Progress: Progressing toward goal Pt will Transfer Bed to Chair/Chair to Bed: with modified independence Pt will Ambulate: >150 feet;with supervision;with least restrictive assistive device PT Goal: Ambulate - Progress: Progressing toward goal  Visit Information  Last PT Received On: 01/11/13 Assistance Needed: +2    Subjective Data  Subjective: I wanna get back to bed Patient Stated Goal: to go home   Cognition  Cognition Overall Cognitive Status: Impaired Area of Impairment: Safety/judgement Arousal/Alertness: Awake/alert Behavior During Session: WFL for tasks performed Safety/Judgement: Impulsive Cognition - Other Comments: h/o TBI and CVA    Balance  Static Sitting  Balance Static Sitting -  Balance Support: Feet supported;No upper extremity supported Static Sitting - Level of Assistance: 3: Mod assist (Assist initially, able to maintain w/o assist for 10 seconds) Static Sitting - Comment/# of Minutes: VC,s for trunk flexion and upright posture. Pt with posterior lean requiring assist to maintain, once trunk set, patient able to maintain balance seated for approx 10 seconds before fatigue. Static Standing Balance Static Standing - Balance Support: Bilateral upper extremity supported;During functional activity Static Standing - Level of Assistance: 1: +2 Total assist Static Standing - Comment/# of Minutes: 2 minutes x2 with weight shifting activities  End of Session PT - End of Session Equipment Utilized During Treatment: Gait belt Activity Tolerance: Patient limited by fatigue;Patient limited by pain Patient left: in chair;with call bell/phone within reach;with nursing in room Nurse Communication: Mobility status   GP     Fabio Asa 01/11/2013, 9:41 AM Charlotte Crumb, PT DPT  [phone]

## 2013-01-11 NOTE — Progress Notes (Signed)
Patient ID: [name], male   DOB: 1951/01/12, 62 y.o.   MRN: [id number]                   [address]            [place] [address]          [phone]     2 Days Post-Op Procedure(s) (LRB): CORONARY ARTERY BYPASS GRAFTING (CABG) (N/A) INTRAOPERATIVE TRANSESOPHAGEAL ECHOCARDIOGRAM (N/A)  Total Length of Stay:  LOS: 9 days  BP 125/80  Pulse 92  Temp(Src) 98.4 F (36.9 C) (Oral)  Resp 21  Ht 5\' 7"  (1.702 m)  Wt 273 lb 13 oz (124.2 kg)  BMI 42.87 kg/m2  SpO2 91%  .Intake/Output     04/01 0701 - 04/02 0700 04/02 0701 - 04/03 0700   P.O. 800 120   I.V. (mL/kg) 1114 (9) 20 (0.2)   Blood     NG/GT     IV Piggyback 250 50   Total Intake(mL/kg) 2164 (17.4) 190 (1.5)   Urine (mL/kg/hr) 1280 (0.4) 2010 (1.6)   Emesis/NG output     Blood     Chest Tube 250 (0.1) 30 (0)   Total Output 1530 2040   Net +634 -1850          . sodium chloride 20 mL/hr at 01/10/13 1800  . sodium chloride 20 mL/hr at 01/11/13 1644  . sodium chloride    . dexmedetomidine Stopped (01/09/13 1730)  . DOPamine Stopped (01/10/13 1730)  . insulin (NOVOLIN-R) infusion 0.5 Units/hr (01/11/13 0300)  . lactated ringers Stopped (01/10/13 1800)  . nitroGLYCERIN    . phenylephrine (NEO-SYNEPHRINE) Adult infusion Stopped (01/10/13 1330)     Lab Results  Component Value Date   WBC 13.3* 01/11/2013   HGB 8.0* 01/11/2013   HCT 22.5* 01/11/2013   PLT 99* 01/11/2013   GLUCOSE 98 01/11/2013   CHOL 188 01/02/2013   TRIG 95 01/02/2013   HDL 49 01/02/2013   LDLCALC 120* 01/02/2013   ALT 25 01/08/2013   AST 19 01/08/2013   NA 136 01/11/2013   K 3.9 01/11/2013   CL 105 01/11/2013   CREATININE 0.92 01/11/2013   BUN 15 01/11/2013   CO2 23 01/11/2013   TSH 1.657 01/09/2013   INR 1.59* 01/09/2013   HGBA1C 6.7* 01/02/2013   Good uop with  Lasix this afternoon Working with PT  Delight Ovens MD  Beeper [phone] Office [phone] 01/11/2013 4:59 PM
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
## 2013-01-11 NOTE — Progress Notes (Signed)
Pt refused cpap, no distress noted, RN aware.

## 2013-01-11 NOTE — Progress Notes (Signed)
Rehab Admissions Coordinator Note:  Patient was screened by Trish Mage for appropriateness for an Inpatient Acute Rehab Consult.  At this time, we are recommending Skilled Nursing Facility.  Very limited bed availability on inpatient rehab at this time.  Trish Mage 01/11/2013, 8:47 AM  I can be reached at [phone].

## 2013-01-12 ENCOUNTER — Inpatient Hospital Stay (HOSPITAL_COMMUNITY): Payer: Medicaid Other

## 2013-01-12 LAB — BASIC METABOLIC PANEL
BUN: 16 mg/dL (ref 6–23)
CO2: 26 mEq/L (ref 19–32)
Calcium: 8.2 mg/dL — ABNORMAL LOW (ref 8.4–10.5)
Chloride: 104 mEq/L (ref 96–112)
Creatinine, Ser: 0.83 mg/dL (ref 0.50–1.35)
GFR calc Af Amer: >90 mL/min (ref >90)
GFR calc non Af Amer: >90 mL/min (ref >90)
Glucose, Bld: 120 mg/dL — ABNORMAL HIGH (ref 70–99)
Potassium: 3.8 mEq/L (ref 3.5–5.1)
Sodium: 137 mEq/L (ref 135–145)

## 2013-01-12 LAB — GLUCOSE, CAPILLARY
Glucose-Capillary: 99 mg/dL (ref 70–99)
Glucose-Capillary: 107 mg/dL — ABNORMAL HIGH (ref 70–99)
Glucose-Capillary: 124 mg/dL — ABNORMAL HIGH (ref 70–99)

## 2013-01-12 LAB — CBC
Hemoglobin: 7.8 g/dL — ABNORMAL LOW (ref 13.0–17.0)
RBC: 2.54 MIL/uL — ABNORMAL LOW (ref 4.22–5.81)
WBC: 14.5 10*3/uL — ABNORMAL HIGH (ref 4.0–10.5)

## 2013-01-12 MED ORDER — ASPIRIN EC 325 MG PO TBEC
325.0000 mg | DELAYED_RELEASE_TABLET | Freq: Every day | ORAL | Status: DC
  Administered 2013-01-12 – 2013-01-19 (×8): 325 mg via ORAL
  Filled 2013-01-12 (×8): qty 1

## 2013-01-12 MED ORDER — LINAGLIPTIN 5 MG PO TABS
5.0000 mg | ORAL_TABLET | Freq: Every day | ORAL | Status: DC
  Administered 2013-01-12 – 2013-01-19 (×8): 5 mg via ORAL
  Filled 2013-01-12 (×8): qty 1

## 2013-01-12 MED ORDER — FOLIC ACID 1 MG PO TABS
1.0000 mg | ORAL_TABLET | Freq: Every day | ORAL | Status: DC
  Administered 2013-01-12 – 2013-01-19 (×8): 1 mg via ORAL
  Filled 2013-01-12 (×8): qty 1

## 2013-01-12 MED ORDER — OXYCODONE HCL 5 MG PO TABS
5.0000 mg | ORAL_TABLET | ORAL | Status: DC | PRN
  Administered 2013-01-12: 10 mg via ORAL
  Administered 2013-01-13: 5 mg via ORAL
  Administered 2013-01-13: 10 mg via ORAL
  Administered 2013-01-13 – 2013-01-14 (×2): 5 mg via ORAL
  Administered 2013-01-14 – 2013-01-19 (×21): 10 mg via ORAL
  Filled 2013-01-12 (×6): qty 2
  Filled 2013-01-12: qty 1
  Filled 2013-01-12: qty 2
  Filled 2013-01-12: qty 1
  Filled 2013-01-12 (×3): qty 2
  Filled 2013-01-12: qty 1
  Filled 2013-01-12 (×2): qty 2
  Filled 2013-01-12: qty 1
  Filled 2013-01-12 (×11): qty 2

## 2013-01-12 MED ORDER — GUAIFENESIN ER 600 MG PO TB12
600.0000 mg | ORAL_TABLET | Freq: Two times a day (BID) | ORAL | Status: DC | PRN
  Filled 2013-01-12: qty 1

## 2013-01-12 MED ORDER — SODIUM CHLORIDE 0.9 % IJ SOLN
3.0000 mL | Freq: Two times a day (BID) | INTRAMUSCULAR | Status: DC
  Administered 2013-01-12 – 2013-01-18 (×11): 3 mL via INTRAVENOUS

## 2013-01-12 MED ORDER — INSULIN ASPART 100 UNIT/ML ~~LOC~~ SOLN
0.0000 [IU] | Freq: Three times a day (TID) | SUBCUTANEOUS | Status: DC
  Administered 2013-01-12 – 2013-01-13 (×3): 2 [IU] via SUBCUTANEOUS
  Administered 2013-01-14: 12:00:00 via SUBCUTANEOUS
  Administered 2013-01-14: 2 [IU] via SUBCUTANEOUS
  Administered 2013-01-14: 4 [IU] via SUBCUTANEOUS
  Administered 2013-01-14: 2 [IU] via SUBCUTANEOUS
  Administered 2013-01-15: 13:00:00 via SUBCUTANEOUS
  Administered 2013-01-15 – 2013-01-19 (×8): 2 [IU] via SUBCUTANEOUS

## 2013-01-12 MED ORDER — METOPROLOL TARTRATE 12.5 MG HALF TABLET
12.5000 mg | ORAL_TABLET | Freq: Two times a day (BID) | ORAL | Status: DC
  Administered 2013-01-12: 12.5 mg via ORAL
  Filled 2013-01-12 (×4): qty 1

## 2013-01-12 MED ORDER — POTASSIUM CHLORIDE CRYS ER 20 MEQ PO TBCR
20.0000 meq | EXTENDED_RELEASE_TABLET | Freq: Every day | ORAL | Status: DC
  Administered 2013-01-12 – 2013-01-13 (×2): 20 meq via ORAL
  Filled 2013-01-12 (×3): qty 1

## 2013-01-12 MED ORDER — TAMSULOSIN HCL 0.4 MG PO CAPS
0.4000 mg | ORAL_CAPSULE | Freq: Every day | ORAL | Status: DC
  Administered 2013-01-12 – 2013-01-19 (×8): 0.4 mg via ORAL
  Filled 2013-01-12 (×8): qty 1

## 2013-01-12 MED ORDER — SODIUM CHLORIDE 0.9 % IV SOLN: 250.0000 mL | INTRAVENOUS | Status: DC | PRN

## 2013-01-12 MED ORDER — ALPRAZOLAM 0.5 MG PO TABS
1.0000 mg | ORAL_TABLET | Freq: Two times a day (BID) | ORAL | Status: DC | PRN
  Administered 2013-01-12 – 2013-01-19 (×9): 1 mg via ORAL
  Filled 2013-01-12 (×9): qty 2

## 2013-01-12 MED ORDER — LISINOPRIL 2.5 MG PO TABS
2.5000 mg | ORAL_TABLET | Freq: Every day | ORAL | Status: DC
  Administered 2013-01-12: 2.5 mg via ORAL
  Filled 2013-01-12 (×2): qty 1

## 2013-01-12 MED ORDER — DOCUSATE SODIUM 100 MG PO CAPS
200.0000 mg | ORAL_CAPSULE | Freq: Every day | ORAL | Status: DC
  Administered 2013-01-12 – 2013-01-15 (×4): 200 mg via ORAL
  Filled 2013-01-12 (×8): qty 2

## 2013-01-12 MED ORDER — FUROSEMIDE 40 MG PO TABS
40.0000 mg | ORAL_TABLET | Freq: Every day | ORAL | Status: AC
  Administered 2013-01-12 – 2013-01-14 (×3): 40 mg via ORAL
  Filled 2013-01-12 (×3): qty 1

## 2013-01-12 MED ORDER — BISACODYL 10 MG RE SUPP: 10.0000 mg | Freq: Every day | RECTAL | Status: DC | PRN

## 2013-01-12 MED ORDER — SODIUM CHLORIDE 0.9 % IJ SOLN: 3.0000 mL | INTRAMUSCULAR | Status: DC | PRN

## 2013-01-12 MED ORDER — MOVING RIGHT ALONG BOOK
Freq: Once | Status: AC
  Administered 2013-01-12: 08:00:00
  Filled 2013-01-12: qty 1

## 2013-01-12 MED ORDER — BISACODYL 5 MG PO TBEC
10.0000 mg | DELAYED_RELEASE_TABLET | Freq: Every day | ORAL | Status: DC | PRN
  Administered 2013-01-15: 10 mg via ORAL
  Filled 2013-01-12: qty 2

## 2013-01-12 MED ORDER — TRAMADOL HCL 50 MG PO TABS
50.0000 mg | ORAL_TABLET | ORAL | Status: DC | PRN
  Administered 2013-01-12 – 2013-01-19 (×3): 100 mg via ORAL
  Filled 2013-01-12 (×3): qty 2

## 2013-01-12 MED ORDER — METFORMIN HCL 500 MG PO TABS: 2000.0000 mg | ORAL_TABLET | Freq: Two times a day (BID) | ORAL | Status: DC
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
## 2013-01-12 NOTE — Progress Notes (Signed)
Pt ambulated twice, once with PT and once with cardiac rehab today. Explained to patient that he needs to walk three times a day. Agreeable to walk for the third time before he goes to sleep. Wife at bedside. [name]

## 2013-01-12 NOTE — Progress Notes (Signed)
CARDIAC REHAB PHASE I   PRE:  Rate/Rhythm: 87 SR    BP: sitting 112/64    SaO2: 96 1L  MODE:  Ambulation: 28 ft   POST:  Rate/Rhythm: 122 ST    BP: sitting 110/66     SaO2: 97 2L  Pt agreeable to walk. Wanted to get up on his left side, which is his bad arm. Ended up pushing right arm against bedrail. Used cane to walk increased distance in hall, assist x2 with gait belt. Exhausted after walk. Made to go to other side of bed so he could get to bed easier going down on right shoulder/arm. Will try pt on other side of bed tomorrow. Very gruff but agreeable. To bed for nap. To get up for dinner although he sts he will not be eating.  [id number]   Elissa Lovett [place] CES, ACSM 01/12/2013 2:53 PM

## 2013-01-12 NOTE — Progress Notes (Signed)
Pt was reordered metformin from his home [facility]. Dose was noted to be 2g bid which is way beyond the max recommended dose. I confirmed it with his pharmacy. I called Dr. Letitia Neri office to ask about the dose. His office told me to dc metformin. Dr. Tyrone Sage ok with dc metformin.

## 2013-01-12 NOTE — Progress Notes (Signed)
Patient ID: [name], male   DOB: 1950-11-09, 62 y.o.   MRN: [id number] TCTS DAILY PROGRESS NOTE                   [address]            [place] [address]          [phone]      3 Days Post-Op Procedure(s) (LRB): CORONARY ARTERY BYPASS GRAFTING (CABG) (N/A) INTRAOPERATIVE TRANSESOPHAGEAL ECHOCARDIOGRAM (N/A)  Total Length of Stay:  LOS: 10 days   Subjective: Says breathing treatment make him feel bad Good effort with mobiltiy  Objective: Vital signs in last 24 hours: Temp:  [97.3 F (36.3 C)-98.6 F (37 C)] 97.3 F (36.3 C) (04/03 0352) Pulse Rate:  [63-105] 80 (04/03 0700) Cardiac Rhythm:  [-] Normal sinus rhythm (04/03 0700) Resp:  [13-24] 16 (04/03 0700) BP: (107-141)/(52-97) 139/70 mmHg (04/03 0700) SpO2:  [91 %-100 %] 99 % (04/03 0700) Weight:  [273 lb 5.9 oz (124 kg)] 273 lb 5.9 oz (124 kg) (04/03 0635)  Filed Weights   01/10/13 0412 01/11/13 0500 01/12/13 0635  Weight: 266 lb 9.6 oz (120.929 kg) 273 lb 13 oz (124.2 kg) 273 lb 5.9 oz (124 kg)    Weight change: -7.1 oz (-0.2 kg)   Hemodynamic parameters for last 24 hours:    Intake/Output from previous day: 04/02 0701 - 04/03 0700 In: 1010 [P.O.:480; I.V.:480; IV Piggyback:50] Out: 3940 [Urine:3910; Chest Tube:30]  Intake/Output this shift:    Current Meds: Scheduled Meds: . acetaminophen  1,000 mg Oral Q6H   Or  . acetaminophen (TYLENOL) oral liquid 160 mg/5 mL  975 mg Per Tube Q6H  . amiodarone  200 mg Oral BID  . aspirin EC  325 mg Oral Daily   Or  . aspirin  324 mg Per Tube Daily  . atorvastatin  20 mg Oral QHS  . bisacodyl  10 mg Oral Daily   Or  . bisacodyl  10 mg Rectal Daily  . docusate sodium  200 mg Oral Daily  . enoxaparin (LOVENOX) injection  30 mg Subcutaneous QHS  . finasteride  5 mg Oral Daily  . gabapentin  100 mg Oral TID  . insulin aspart  0-24 Units Subcutaneous Q4H  . insulin detemir  22 Units Subcutaneous Q1200  . insulin regular  0-10 Units  Intravenous TID WC  . iron polysaccharides  150 mg Oral Daily  . levalbuterol  0.63 mg Nebulization TID  . lisinopril  2.5 mg Oral Daily  . loratadine  10 mg Oral Daily  . metoprolol tartrate  25 mg Per Tube BID   Or  . metoprolol tartrate  12.5 mg Oral BID  . pantoprazole  40 mg Oral Daily  . PARoxetine  20 mg Oral Daily  . rOPINIRole  0.25 mg Oral QHS  . sodium chloride  3 mL Intravenous Q12H   Continuous Infusions: . sodium chloride Stopped (01/11/13 1644)  . sodium chloride 20 mL/hr at 01/11/13 1644  . sodium chloride    . dexmedetomidine Stopped (01/09/13 1730)  . DOPamine Stopped (01/10/13 1730)  . insulin (NOVOLIN-R) infusion 0.5 Units/hr (01/11/13 0300)  . lactated ringers Stopped (01/10/13 1800)  . nitroGLYCERIN    . phenylephrine (NEO-SYNEPHRINE) Adult infusion Stopped (01/10/13 1330)   PRN Meds:.metoprolol, midazolam, ondansetron (ZOFRAN) IV, oxyCODONE, sodium chloride  General appearance: alert, cooperative, appears older than stated age and no distress Neurologic: intact Heart: regular rate and rhythm, S1, S2  normal, no murmur, click, rub or gallop Lungs: clear to auscultation bilaterally Abdomen: soft, non-tender; bowel sounds normal; no masses,  no organomegaly Extremities: extremities normal, atraumatic, no cyanosis or edema and Homans sign is negative, no sign of DVT Wound: sternum stable  Lab Results: CBC: Recent Labs  01/11/13 0405 01/12/13 0350  WBC 13.3* 14.5*  HGB 8.0* 7.8*  HCT 22.5* 22.6*  PLT 99* 119*   BMET:  Recent Labs  01/11/13 0405 01/12/13 0350  NA 136 137  K 3.9 3.8  CL 105 104  CO2 23 26  GLUCOSE 98 120*  BUN 15 16  CREATININE 0.92 0.83  CALCIUM 7.5* 8.2*    PT/INR:  Recent Labs  01/09/13 1440  LABPROT 18.5*  INR 1.59*   Radiology: Dg Chest Port 1 View  01/12/2013  *RADIOLOGY REPORT*  Clinical Data: Postop.  Check chest tube.  PORTABLE CHEST - 1 VIEW  Comparison: 01/11/2013  Findings: Stable appearance of postoperative  changes in the mediastinum.  Left chest tube is not visualized but may be obscured by the left hemidiaphragm.  Right central venous catheter is unchanged in position.  Shallow inspiration with bilateral basilar atelectasis.  Cardiac enlargement.  Pulmonary vascularity is normal for technique.  No pneumothorax.  IMPRESSION: Cardiac enlargement.  Bilateral basilar atelectasis.  Left chest tube is not identified but may be obscured by left hemidiaphragm.   Original Report Authenticated By: Burman Nieves, M.D.    Dg Chest Port 1 View  01/11/2013  *RADIOLOGY REPORT*  Clinical Data: status post cardiac surgery  PORTABLE CHEST - 1 VIEW  Comparison: 01/10/2013  Findings: Status post median sternotomy and CABG procedure.  There is a right IJ catheter with tip in the SVC.  There is a left-sided chest tube in place.  No pneumothorax noted.  Heart size is enlarged.  No pleural effusion or edema.  Lung volumes are low.  IMPRESSION:  1.  Left chest tube in place without pneumothorax. 2.  Cardiac enlargement.  No heart failure.   Original Report Authenticated By: Signa Kell, M.D.      Assessment/Plan: S/P Procedure(s) (LRB): CORONARY ARTERY BYPASS GRAFTING (CABG) (N/A) INTRAOPERATIVE TRANSESOPHAGEAL ECHOCARDIOGRAM (N/A) Mobilize Diuresis Diabetes control Plan for transfer to step-down: see transfer orders On iron folate , avoid transfusion if poss, no blood so far    Mahmoud Blazejewski B 01/12/2013 7:53 AM

## 2013-01-12 NOTE — Plan of Care (Signed)
Problem: Phase I Progression Outcomes Goal: Initial discharge plan identified Outcome: Completed/Met Date Met:  01/12/13 Patient is going to rehab.

## 2013-01-12 NOTE — Progress Notes (Signed)
Physical Therapy Treatment Patient Details Name: [name] MRN: [id number] DOB: December 10, 1950 Today's Date: 01/12/2013 Time: 4098-1191 PT Time Calculation (min): 24 min  PT Assessment / Plan / Recommendation Comments on Treatment Session  Pt with some modest improvements in activity today.  Patient able to increase ambulation using SPC and assist. Patient still impulsive with cues to control movement and direct to task. Patient with poor O2 saturations upon exersion.  78% with 3 liters to rebound to 96%. Place on 2 liters at rest post activity.  Pt with very poor compliance with sternal precautions despite max VCs. Handout provided again and patient reeudcated on importance of following precautions. Will continue to benefit from acute PT to address deficits and facilitate d/c to next level of care.    Follow Up Recommendations  CIR (If not CIR appropriate then pt may need SNF)           Equipment Recommendations  None recommended by PT;Other (comment)    Recommendations for Other Services OT consult  Frequency Min 3X/week   Plan Discharge plan remains appropriate    Precautions / Restrictions Precautions Precautions: Sternal;Fall Restrictions Weight Bearing Restrictions: Yes (pt needs continued reminder for sternal precaution )   Pertinent Vitals/Pain 7/10; NAD    Mobility  Bed Mobility Bed Mobility: Supine to Sit;Sitting - Scoot to Edge of Bed Supine to Sit: 1: +2 Total assist Supine to Sit: Patient Percentage: 40% Sitting - Scoot to Edge of Bed: 3: Mod assist Details for Bed Mobility Assistance: Continued VCs for sternal precautions; pt with poor control and impulsivity with mobility Transfers Transfers: Sit to Stand;Stand to Sit Sit to Stand: 1: +2 Total assist;From bed;From chair/3-in-1 Sit to Stand: Patient Percentage: 70% Stand to Sit: 1: +2 Total assist;To bed;To chair/3-in-1 Stand to Sit: Patient Percentage: 80% Details for Transfer Assistance: Max VCs for sternal  precaution, pt non-compliant; still trying to pull on therapy hands to stand despite being directed to hug pillow. Performed transfer from bed; cane provided once standing with VCs not to push wight through cane Ambulation/Gait Ambulation/Gait Assistance: 1: +2 Total assist Ambulation/Gait: Patient Percentage: 70% Ambulation Distance (Feet): 14 Feet Assistive device: Straight cane;1 person hand held assist Ambulation/Gait Assistance Details: Pt did have Kafo and shoe. Pt with improvements in ability today but still very impulsive and varies with level of assist. Gait Pattern: Decreased weight shift to left;Shuffle;Narrow base of support General Gait Details: Pt destuartion with exertion on rm air; recommend 3 liters O2 for ambulation Stairs: No    Exercises Total Joint Exercises Ankle Circles/Pumps: AROM;Both;20 reps Straight Leg Raises: AROM;Strengthening;Both;10 reps;Supine Long Arc Quad: AROM;Both;10 reps     PT Goals Acute Rehab PT Goals PT Goal Formulation: With patient Time For Goal Achievement: 01/18/13 Potential to Achieve Goals: Good Pt will go Supine/Side to Sit: with supervision;Other (comment) (With compliance to sternal precautions) PT Goal: Supine/Side to Sit - Progress: Progressing toward goal Pt will go Stand to Sit: with modified independence PT Goal: Stand to Sit - Progress: Progressing toward goal Pt will Transfer Bed to Chair/Chair to Bed: with modified independence PT Transfer Goal: Bed to Chair/Chair to Bed - Progress: Progressing toward goal Pt will Ambulate: >150 feet;with supervision;with least restrictive assistive device PT Goal: Ambulate - Progress: Progressing toward goal  Visit Information  Last PT Received On: 01/12/13 Assistance Needed: +2    Subjective Data  Subjective: pt just had BM Patient Stated Goal: to go home   Cognition  Cognition Overall Cognitive Status: Impaired Area  of Impairment: Safety/judgement Arousal/Alertness:  Awake/alert Behavior During Session: WFL for tasks performed Safety/Judgement: Impulsive Cognition - Other Comments: h/o TBI and CVA    Balance  Static Sitting Balance Static Sitting - Balance Support: Feet supported;No upper extremity supported Static Sitting - Level of Assistance: 5: Stand by assistance (Improvements in trunk control today) Static Sitting - Comment/# of Minutes: VCs to maintain sternal precautions Static Standing Balance Static Standing - Balance Support: Bilateral upper extremity supported;During functional activity Static Standing - Level of Assistance: 3: Mod assist Static Standing - Comment/# of Minutes: 4 minutes  End of Session PT - End of Session Equipment Utilized During Treatment: Gait belt Activity Tolerance: Patient limited by fatigue;Patient limited by pain Patient left: in chair;with call bell/phone within reach;with nursing in room (wheel chair) Nurse Communication: Mobility status   GP     Fabio Asa 01/12/2013, 11:04 AM Charlotte Crumb, PT DPT  [phone]

## 2013-01-13 ENCOUNTER — Inpatient Hospital Stay (HOSPITAL_COMMUNITY): Payer: Medicaid Other

## 2013-01-13 LAB — GLUCOSE, CAPILLARY
Glucose-Capillary: 149 mg/dL — ABNORMAL HIGH (ref 70–99)
Glucose-Capillary: 119 mg/dL — ABNORMAL HIGH (ref 70–99)

## 2013-01-13 LAB — CBC
HCT: 21.9 % — ABNORMAL LOW (ref 39.0–52.0)
Hemoglobin: 7.7 g/dL — ABNORMAL LOW (ref 13.0–17.0)
MCH: 31.7 pg (ref 26.0–34.0)
MCHC: 35.2 g/dL (ref 30.0–36.0)
MCV: 90.1 fL (ref 78.0–100.0)
Platelets: 138 10*3/uL — ABNORMAL LOW (ref 150–400)
RBC: 2.43 MIL/uL — ABNORMAL LOW (ref 4.22–5.81)
RDW: 14.9 % (ref 11.5–15.5)
WBC: 10.8 10*3/uL — ABNORMAL HIGH (ref 4.0–10.5)

## 2013-01-13 LAB — BASIC METABOLIC PANEL
BUN: 14 mg/dL (ref 6–23)
CO2: 27 mEq/L (ref 19–32)
Calcium: 8.3 mg/dL — ABNORMAL LOW (ref 8.4–10.5)
Chloride: 102 mEq/L (ref 96–112)
Creatinine, Ser: 0.83 mg/dL (ref 0.50–1.35)
GFR calc Af Amer: >90 mL/min (ref >90)
GFR calc non Af Amer: >90 mL/min (ref >90)
Glucose, Bld: 96 mg/dL (ref 70–99)
Potassium: 3.7 mEq/L (ref 3.5–5.1)
Sodium: 137 mEq/L (ref 135–145)

## 2013-01-13 LAB — PREPARE RBC (CROSSMATCH)

## 2013-01-13 MED ORDER — POTASSIUM CHLORIDE CRYS ER 20 MEQ PO TBCR
30.0000 meq | EXTENDED_RELEASE_TABLET | Freq: Once | ORAL | Status: AC
  Administered 2013-01-13: 30 meq via ORAL
  Filled 2013-01-13: qty 1

## 2013-01-13 MED ORDER — FUROSEMIDE 10 MG/ML IJ SOLN: 20.0000 mg | Freq: Once | INTRAMUSCULAR | Status: DC

## 2013-01-13 MED ORDER — METOPROLOL TARTRATE 25 MG PO TABS
25.0000 mg | ORAL_TABLET | Freq: Two times a day (BID) | ORAL | Status: DC
  Administered 2013-01-13: 25 mg via ORAL
  Filled 2013-01-13 (×2): qty 1

## 2013-01-13 MED ORDER — LISINOPRIL 2.5 MG PO TABS
2.5000 mg | ORAL_TABLET | Freq: Every day | ORAL | Status: DC
  Administered 2013-01-14 – 2013-01-19 (×6): 2.5 mg via ORAL
  Filled 2013-01-13 (×6): qty 1

## 2013-01-13 MED ORDER — METOPROLOL TARTRATE 12.5 MG HALF TABLET
12.5000 mg | ORAL_TABLET | Freq: Two times a day (BID) | ORAL | Status: DC
  Filled 2013-01-13 (×3): qty 1

## 2013-01-13 NOTE — Progress Notes (Addendum)
                   [address]            [name] [address]          [phone]      4 Days Post-Op Procedure(s) (LRB): CORONARY ARTERY BYPASS GRAFTING (CABG) (N/A) INTRAOPERATIVE TRANSESOPHAGEAL ECHOCARDIOGRAM (N/A)  Subjective: Patient eating breakfast and has no complaints.  Objective: Vital signs in last 24 hours: Temp:  [97.7 F (36.5 C)-99 F (37.2 C)] 98.2 F (36.8 C) (04/04 0409) Pulse Rate:  [73-91] 91 (04/04 0409) Cardiac Rhythm:  [-] Normal sinus rhythm (04/04 0409) Resp:  [14-19] 18 (04/04 0409) BP: (90-144)/(51-76) 110/55 mmHg (04/04 0409) SpO2:  [96 %-99 %] 96 % (04/04 0409) Weight:  [122.6 kg (270 lb 4.5 oz)] 122.6 kg (270 lb 4.5 oz) (04/04 0409)  Pre op weight 119 kg Current Weight  01/13/13 122.6 kg (270 lb 4.5 oz)      Intake/Output from previous day: 04/03 0701 - 04/04 0700 In: 580 [P.O.:560; I.V.:20] Out: 826 [Urine:825; Stool:1]   Physical Exam:  Cardiovascular: Tachycardic, no murmurs, gallops, or rubs. Pulmonary:Diminshed at bases; no rales, wheezes, or rhonchi. Abdomen: Soft, non tender, bowel sounds present. Extremities: Bilateral lower extremity edema. Wounds: Clean and dry.  No erythema or signs of infection.  Lab Results: CBC: Recent Labs  01/12/13 0350 01/13/13 0540  WBC 14.5* 10.8*  HGB 7.8* 7.7*  HCT 22.6* 21.9*  PLT 119* 138*   BMET:  Recent Labs  01/12/13 0350 01/13/13 0540  NA 137 137  K 3.8 3.7  CL 104 102  CO2 26 27  GLUCOSE 120* 96  BUN 16 14  CREATININE 0.83 0.83  CALCIUM 8.2* 8.3*    PT/INR:  Lab Results  Component Value Date   INR 1.59* 01/09/2013   INR 1.03 01/03/2013   ABG:  INR: Will add last result for INR, ABG once components are confirmed Will add last 4 CBG results once components are confirmed  Assessment/Plan:  1. CV - s/p NSTEMI.ST. On Amiodarone 200 bid, Lisinopril 2.5 daily, and Lopressor 12.5 bid. Will increase Lopressor to 25 bid and hold Lisinopril until BP  improves 2.  Pulmonary - Wean oxygen (on 1 liter via ) as tolerates.Encourage incentive spirometer 3. Volume Overload - On Lasix 40 daily 4.  Acute blood loss anemia - H and H decreased to 7.7 and 21.9. On Niferex and folic acid. Likely needs transfusion 5.Supplement potassium 6.DM-CBGs 109/152/79. Continue Tradjenta 5 daily 7.Thrombocytopenia-platelets up to 138,000 8.Continue with cardiac rehab  ZIMMERMAN,[name] 01/13/2013,7:21 AM  HCT lower today, will transfuse I have seen and examined [name] and agree with the above assessment  and plan.  Delight Ovens MD [facility] [phone] Office [phone] 01/13/2013 5:14 PM

## 2013-01-13 NOTE — Progress Notes (Signed)
Report given to RN and patient and belongings transferred to room 2012. Wife at bedside. [name]

## 2013-01-13 NOTE — Progress Notes (Signed)
Blood transfusion complete and bp 84/60. Dr. Tyrone Sage at bedside and new orders received. Iv lasix held due to low BP. Pt sleepy but has been complaining of always having interrupted sleep here in the hospital. Otherwise asymptomatic. Will cont to monitor

## 2013-01-13 NOTE — Care Management Note (Signed)
    Page 1 of 2   01/19/2013     2:31:25 PM   CARE MANAGEMENT NOTE 01/19/2013  Patient:  [name], [name]   Account Number:  [id number]  Date Initiated:  01/02/2013  Documentation initiated by:  Donn Pierini  Subjective/Objective Assessment:   Pt admitted with NSTEMI- s/p cath with 3VD     Action/Plan:   PTA pt lived at home with spouse- [facility] to follow for potential d/c needs   Anticipated DC Date:  01/13/2013   Anticipated DC Plan:  SKILLED NURSING FACILITY      DC Planning Services  CM consult      Choice offered to / List presented to:             Status of service:  Completed, signed off Medicare Important Message given?   (If response is "NO", the following Medicare IM given date fields will be blank) Date Medicare IM given:   Date Additional Medicare IM given:    Discharge Disposition:  SKILLED NURSING FACILITY  Per UR Regulation:  Reviewed for med. necessity/level of care/duration of stay  If discussed at Long Length of Stay Meetings, dates discussed:    Comments:  ContactJoyice Faster [phone]                 Birdena Crandall Sister [phone]  01/19/13 Othella Slappey,RN,BSN [phone] PT DISCHARGING TO [facility] TODAY, PER CSW ARRANGEMENTS.  01/18/13 Zarion Oliff,RN,BSN [phone] PER CSW, BED AVAILABLE FOR PT AT [facility] WHEN MEDICALLY STABLE.  01/14/12 Anjel Pardo,RN,BSN [phone] CSW CONT TO FOLLOW FOR LIKELY SNF PLACEMENT AT DC.   01/05/13- 1400- Donn Pierini RN, BSN [phone] Pt with NSTEMI- 3VD- plan is for CABG on Monday 01/09/13- PT/OT to follow post op for d/c needs and planning- NCM to cont. to follow post op- pt may need ST-SNF for rehab post op vs HH.

## 2013-01-13 NOTE — Progress Notes (Signed)
Notified Donielle, PA that pts bp 82/50 and temp prior to administering blood 99.6. No new orders. Will transfuse as ordered and closely monitor

## 2013-01-13 NOTE — Plan of Care (Signed)
Problem: Phase III Progression Outcomes Goal: Transfer to PCTU/Telemetry POD Outcome: Completed/Met Date Met:  01/13/13 Pt transferred to room 2016 on 01/12/13.

## 2013-01-13 NOTE — Clinical Social Work Note (Signed)
CSW following for possible SNF placement. No SNF beds available at this time d/t with barrier being Pt's insurance. CSW will f/u on Monday to resend out Pt's info with updated clinical info.   Frederico Hamman, LCSW  [phone]

## 2013-01-13 NOTE — Progress Notes (Signed)
CARDIAC REHAB PHASE I   PRE:  Rate/Rhythm: 107ST  BP:  Supine: 96/56  Sitting:   Standing:    SaO2: 94-95%RA  MODE:  Ambulation: 60 ft   POST:  Rate/Rhythm: 154, 129 with rest   BP:  Supine: 129/64  Sitting:   Standing:    SaO2: 95%RA 0925-1000 Put shoe and brace on prior to walk. Used gait belt to help stabilize pt. Encouraged pt to walk slowly as he gets impulsive and fast. Pt walked 60 ft on RA with asst x 2 , gait belt,and his cane. Pt's HR to 154 during walk and as he became fatigued,his left leg did not want to cooperate. Had to stop and take a rest while pt tried to get control of his leg. Pulled recliner up to pt to sit in room as he was exhausted. HR to 129 with rest. Pt stated that was too far for him. High risk for fall.   Luetta Nutting, RN BSN  01/13/2013 9:55 AM

## 2013-01-14 LAB — TYPE AND SCREEN
ABO/RH(D): B POS
Antibody Screen: NEGATIVE
Unit division: 0
Unit division: 0

## 2013-01-14 LAB — BASIC METABOLIC PANEL
BUN: 13 mg/dL (ref 6–23)
CO2: 26 mEq/L (ref 19–32)
Calcium: 8.2 mg/dL — ABNORMAL LOW (ref 8.4–10.5)
Chloride: 102 mEq/L (ref 96–112)
Creatinine, Ser: 0.85 mg/dL (ref 0.50–1.35)
GFR calc Af Amer: >90 mL/min (ref >90)
GFR calc non Af Amer: >90 mL/min (ref >90)
Glucose, Bld: 136 mg/dL — ABNORMAL HIGH (ref 70–99)
Potassium: 3.4 mEq/L — ABNORMAL LOW (ref 3.5–5.1)
Sodium: 136 mEq/L (ref 135–145)

## 2013-01-14 LAB — CBC
HCT: 27.9 % — ABNORMAL LOW (ref 39.0–52.0)
MCH: 29.7 pg (ref 26.0–34.0)
MCV: 87.2 fL (ref 78.0–100.0)
Platelets: 169 10*3/uL (ref 150–400)
RBC: 3.2 MIL/uL — ABNORMAL LOW (ref 4.22–5.81)

## 2013-01-14 LAB — GLUCOSE, CAPILLARY
Glucose-Capillary: 134 mg/dL — ABNORMAL HIGH (ref 70–99)
Glucose-Capillary: 132 mg/dL — ABNORMAL HIGH (ref 70–99)

## 2013-01-14 MED ORDER — METOPROLOL TARTRATE 25 MG PO TABS
25.0000 mg | ORAL_TABLET | Freq: Two times a day (BID) | ORAL | Status: DC
  Administered 2013-01-14 – 2013-01-19 (×5): 25 mg via ORAL
  Filled 2013-01-14 (×12): qty 1

## 2013-01-14 MED ORDER — LEVALBUTEROL HCL 0.63 MG/3ML IN NEBU
0.6300 mg | INHALATION_SOLUTION | Freq: Three times a day (TID) | RESPIRATORY_TRACT | Status: DC
  Filled 2013-01-14 (×2): qty 3

## 2013-01-14 MED ORDER — POTASSIUM CHLORIDE CRYS ER 20 MEQ PO TBCR
40.0000 meq | EXTENDED_RELEASE_TABLET | Freq: Once | ORAL | Status: AC
  Administered 2013-01-14: 40 meq via ORAL
  Filled 2013-01-14: qty 2

## 2013-01-14 MED ORDER — LEVALBUTEROL TARTRATE 45 MCG/ACT IN AERO
2.0000 | INHALATION_SPRAY | Freq: Three times a day (TID) | RESPIRATORY_TRACT | Status: DC
  Administered 2013-01-14 – 2013-01-15 (×3): 2 via RESPIRATORY_TRACT
  Filled 2013-01-14: qty 15

## 2013-01-14 NOTE — Progress Notes (Signed)
CARDIAC REHAB PHASE I   PRE:  Rate/Rhythm: 94 NSR  BP:  Supine:   Sitting: 118/68  Standing:    SaO2: 93%ra  MODE:  Ambulation: unable to ambulate   POST:  Rate/Rhythem: 95  BP:  Supine:   Sitting: 118/60  Standing:    SaO2: 97%  1500-1518  Pt unable to ambulate.  Pt unable to gain steady balance upon standing, gait belt, boot X2 full assist. Pt to chair.  Call light in reach.    Tayler Heiden, [place]
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
## 2013-01-14 NOTE — Progress Notes (Addendum)
[address]       [place] [address]             [phone]    5 Days Post-Op  Procedure(s) (LRB): CORONARY ARTERY BYPASS GRAFTING (CABG) (N/A) INTRAOPERATIVE TRANSESOPHAGEAL ECHOCARDIOGRAM (N/A) Subjective:  feels ok, in good spirits   Objective  Telemetry sinus rhythm  Temp:  [97.7 F (36.5 C)-99.9 F (37.7 C)] 98.1 F (36.7 C) (04/05 0434) Pulse Rate:  [72-120] 93 (04/05 0434) Resp:  [16-22] 17 (04/05 0434) BP: (82-129)/(50-78) 129/58 mmHg (04/05 0434) SpO2:  [93 %-100 %] 93 % (04/05 0434) Weight:  [269 lb 13.5 oz (122.4 kg)] 269 lb 13.5 oz (122.4 kg) (04/05 0434)   Intake/Output Summary (Last 24 hours) at 01/14/13 0814 Last data filed at 01/14/13 0645  Gross per 24 hour  Intake  844.5 ml  Output    825 ml  Net   19.5 ml       General appearance: alert, cooperative and no distress Heart: regular rate and rhythm Lungs: coarse with exp wheeze Abdomen: soft, nontender Extremities: minor edema Wound: incisions healing well  Lab Results:  Recent Labs  01/13/13 0540 01/14/13 0502  NA 137 136  K 3.7 3.4*  CL 102 102  CO2 27 26  GLUCOSE 96 136*  BUN 14 13  CREATININE 0.83 0.85  CALCIUM 8.3* 8.2*   No results found for this basename: AST, ALT, ALKPHOS, BILITOT, PROT, ALBUMIN,  in the last 72 hours No results found for this basename: LIPASE, AMYLASE,  in the last 72 hours  Recent Labs  01/13/13 0540 01/14/13 0502  WBC 10.8* 7.8  HGB 7.7* 9.5*  HCT 21.9* 27.9*  MCV 90.1 87.2  PLT 138* 169   No results found for this basename: CKTOTAL, CKMB, TROPONINI,  in the last 72 hours No components found with this basename: POCBNP,  No results found for this basename: DDIMER,  in the last 72 hours No results found for this basename: HGBA1C,  in the last 72 hours No results found for this basename: CHOL, HDL, LDLCALC, TRIG, CHOLHDL,  in the last 72 hours No results found for this basename: TSH, T4TOTAL, FREET3, T3FREE, THYROIDAB,  in the  last 72 hours No results found for this basename: VITAMINB12, FOLATE, FERRITIN, TIBC, IRON, RETICCTPCT,  in the last 72 hours  Medications: Scheduled . amiodarone  200 mg Oral BID  . aspirin EC  325 mg Oral Daily  . atorvastatin  20 mg Oral QHS  . docusate sodium  200 mg Oral Daily  . enoxaparin (LOVENOX) injection  30 mg Subcutaneous QHS  . finasteride  5 mg Oral Daily  . folic acid  1 mg Oral Daily  . furosemide  40 mg Oral Daily  . gabapentin  100 mg Oral TID  . insulin aspart  0-24 Units Subcutaneous TID AC & HS  . insulin detemir  22 Units Subcutaneous Q1200  . iron polysaccharides  150 mg Oral Daily  . linagliptin  5 mg Oral Daily  . lisinopril  2.5 mg Oral Daily  . loratadine  10 mg Oral Daily  . metoprolol tartrate  12.5 mg Oral BID  . PARoxetine  20 mg Oral Daily  . potassium chloride  20 mEq Oral Daily  . rOPINIRole  0.25 mg Oral QHS  . sodium chloride  3 mL Intravenous Q12H  . tamsulosin  0.4 mg Oral Daily     Radiology/Studies:  Dg Chest 2 View  01/13/2013  *RADIOLOGY REPORT*  Clinical Data: Follow up post CABG  CHEST - 2 VIEW  Comparison: Portable chest x-ray of 01/12/2013  Findings: The venous sheath has been removed from the IVC.  No pneumothorax is seen.  The lungs are relatively well aerated. Cardiomegaly is stable.  IMPRESSION: No active lung disease.  Stable cardiomegaly.   Original Report Authenticated By: Dwyane Dee, M.D.     INR: Will add last result for INR, ABG once components are confirmed Will add last 4 CBG results once components are confirmed  Assessment/Plan: S/P Procedure(s) (LRB): CORONARY ARTERY BYPASS GRAFTING (CABG) (N/A) INTRAOPERATIVE TRANSESOPHAGEAL ECHOCARDIOGRAM (N/A)  1 conts to make progress  2 hemodynamically stable on current rx, cont gentle diuresis 3 H.H improved after transfusion 4 platelets improved 5 cbg's good control 6 replace K+ 7 will add xopenex, push pulm toilet as able 8 push rehab as able- will need snf , social  work involved  LOS: 12 days    GOLD,WAYNE E 4/5/20148:14 AM  patient examined and medical record reviewed,agree with above note. VAN TRIGT III,Kolbee Stallman 01/14/2013

## 2013-01-15 ENCOUNTER — Inpatient Hospital Stay (HOSPITAL_COMMUNITY): Payer: Medicaid Other

## 2013-01-15 LAB — CBC WITH DIFFERENTIAL/PLATELET
Basophils Absolute: 0 10*3/uL (ref 0.0–0.1)
Basophils Relative: 0 % (ref 0–1)
Eosinophils Absolute: 0 10*3/uL (ref 0.0–0.7)
Eosinophils Relative: 0 % (ref 0–5)
HCT: 27.5 % — ABNORMAL LOW (ref 39.0–52.0)
Hemoglobin: 9.4 g/dL — ABNORMAL LOW (ref 13.0–17.0)
Lymphocytes Relative: 9 % — ABNORMAL LOW (ref 12–46)
Lymphs Abs: 0.9 10*3/uL (ref 0.7–4.0)
MCH: 29.7 pg (ref 26.0–34.0)
MCHC: 34.2 g/dL (ref 30.0–36.0)
MCV: 86.8 fL (ref 78.0–100.0)
Monocytes Absolute: 1.3 10*3/uL — ABNORMAL HIGH (ref 0.1–1.0)
Monocytes Relative: 12 % (ref 3–12)
Neutro Abs: 8 10*3/uL — ABNORMAL HIGH (ref 1.7–7.7)
Neutrophils Relative %: 78 % — ABNORMAL HIGH (ref 43–77)
Platelets: 220 10*3/uL (ref 150–400)
RBC: 3.17 MIL/uL — ABNORMAL LOW (ref 4.22–5.81)
RDW: 15.8 % — ABNORMAL HIGH (ref 11.5–15.5)
WBC: 10.2 10*3/uL (ref 4.0–10.5)

## 2013-01-15 LAB — URINALYSIS, ROUTINE W REFLEX MICROSCOPIC
Bilirubin Urine: NEGATIVE
Glucose, UA: NEGATIVE mg/dL
Hgb urine dipstick: NEGATIVE
Ketones, ur: NEGATIVE mg/dL
Nitrite: NEGATIVE
Specific Gravity, Urine: 1.015 (ref 1.005–1.030)
pH: 5.5 (ref 5.0–8.0)

## 2013-01-15 LAB — GLUCOSE, CAPILLARY
Glucose-Capillary: 174 mg/dL — ABNORMAL HIGH (ref 70–99)
Glucose-Capillary: 121 mg/dL — ABNORMAL HIGH (ref 70–99)
Glucose-Capillary: 84 mg/dL (ref 70–99)

## 2013-01-15 MED ORDER — CEFAZOLIN SODIUM 1-5 GM-% IV SOLN
1.0000 g | Freq: Three times a day (TID) | INTRAVENOUS | Status: DC
  Administered 2013-01-15 – 2013-01-17 (×7): 1 g via INTRAVENOUS
  Filled 2013-01-15 (×9): qty 50

## 2013-01-15 MED ORDER — ACETAMINOPHEN 500 MG PO TABS
1000.0000 mg | ORAL_TABLET | Freq: Four times a day (QID) | ORAL | Status: AC | PRN
  Administered 2013-01-15: 1000 mg via ORAL
  Filled 2013-01-15: qty 2

## 2013-01-15 MED ORDER — FUROSEMIDE 40 MG PO TABS
40.0000 mg | ORAL_TABLET | Freq: Every day | ORAL | Status: DC
  Administered 2013-01-15 – 2013-01-19 (×5): 40 mg via ORAL
  Filled 2013-01-15 (×5): qty 1

## 2013-01-15 MED ORDER — POTASSIUM CHLORIDE CRYS ER 20 MEQ PO TBCR
20.0000 meq | EXTENDED_RELEASE_TABLET | Freq: Every day | ORAL | Status: DC
  Administered 2013-01-15 – 2013-01-19 (×5): 20 meq via ORAL
  Filled 2013-01-15 (×5): qty 1

## 2013-01-15 NOTE — Progress Notes (Addendum)
[address]       [place] [address]             [phone]    6 Days Post-Op  Procedure(s) (LRB): CORONARY ARTERY BYPASS GRAFTING (CABG) (N/A) INTRAOPERATIVE TRANSESOPHAGEAL ECHOCARDIOGRAM (N/A) Subjective: Feels poorly as a general statement. Feels sweaty/hot  Objective  Telemetry sinus rhythm  Temp:  [99.1 F (37.3 C)-103 F (39.4 C)] 103 F (39.4 C) (04/06 0408) Pulse Rate:  [95-111] 98 (04/06 0448) Resp:  [18] 18 (04/06 0408) BP: (92-112)/(59-68) 100/64 mmHg (04/06 0448) SpO2:  [91 %-96 %] 96 % (04/06 0408) Weight:  [270 lb 8.1 oz (122.7 kg)] 270 lb 8.1 oz (122.7 kg) (04/06 0448)   Intake/Output Summary (Last 24 hours) at 01/15/13 0808 Last data filed at 01/14/13 1800  Gross per 24 hour  Intake    480 ml  Output    900 ml  Net   -420 ml       General appearance: alert, cooperative, fatigued and no distress Heart: regular rate and rhythm Lungs: dim in bases Abdomen: mod dist., soft, nontender Extremities: + mild LE edema, + right LE pretibial erethema Wound: incisions healing well  Lab Results:  Recent Labs  01/13/13 0540 01/14/13 0502  NA 137 136  K 3.7 3.4*  CL 102 102  CO2 27 26  GLUCOSE 96 136*  BUN 14 13  CREATININE 0.83 0.85  CALCIUM 8.3* 8.2*   No results found for this basename: AST, ALT, ALKPHOS, BILITOT, PROT, ALBUMIN,  in the last 72 hours No results found for this basename: LIPASE, AMYLASE,  in the last 72 hours  Recent Labs  01/14/13 0502 01/15/13 0535  WBC 7.8 10.2  NEUTROABS  --  8.0*  HGB 9.5* 9.4*  HCT 27.9* 27.5*  MCV 87.2 86.8  PLT 169 220   No results found for this basename: CKTOTAL, CKMB, TROPONINI,  in the last 72 hours No components found with this basename: POCBNP,  No results found for this basename: DDIMER,  in the last 72 hours No results found for this basename: HGBA1C,  in the last 72 hours No results found for this basename: CHOL, HDL, LDLCALC, TRIG, CHOLHDL,  in the last 72 hours No  results found for this basename: TSH, T4TOTAL, FREET3, T3FREE, THYROIDAB,  in the last 72 hours No results found for this basename: VITAMINB12, FOLATE, FERRITIN, TIBC, IRON, RETICCTPCT,  in the last 72 hours  Medications: Scheduled . amiodarone  200 mg Oral BID  . aspirin EC  325 mg Oral Daily  . atorvastatin  20 mg Oral QHS  . docusate sodium  200 mg Oral Daily  . enoxaparin (LOVENOX) injection  30 mg Subcutaneous QHS  . finasteride  5 mg Oral Daily  . folic acid  1 mg Oral Daily  . gabapentin  100 mg Oral TID  . insulin aspart  0-24 Units Subcutaneous TID AC & HS  . insulin detemir  22 Units Subcutaneous Q1200  . iron polysaccharides  150 mg Oral Daily  . levalbuterol  2 puff Inhalation Q8H  . linagliptin  5 mg Oral Daily  . lisinopril  2.5 mg Oral Daily  . loratadine  10 mg Oral Daily  . metoprolol tartrate  25 mg Oral BID  . PARoxetine  20 mg Oral Daily  . rOPINIRole  0.25 mg Oral QHS  . sodium chloride  3 mL Intravenous Q12H  . tamsulosin  0.4 mg Oral Daily     Radiology/Studies:  Dg Chest 2 View  01/15/2013  *RADIOLOGY REPORT*  Clinical Data: Fever  CHEST - 2 VIEW  Comparison: 01/13/2013  Findings: Previous CABG.  Stable cardiomegaly.  Mild perihilar interstitial edema, slightly increased since previous exam. Persistent small effusions, left greater than right.  Left retrocardiac consolidation / atelectasis slightly increased.  IMPRESSION:  1.  Slight increase in interstitial edema with stable cardiomegaly and small effusions.   Original Report Authenticated By: D. Andria Rhein, MD    Dg Chest 2 View  01/13/2013  *RADIOLOGY REPORT*  Clinical Data: Follow up post CABG  CHEST - 2 VIEW  Comparison: Portable chest x-ray of 01/12/2013  Findings: The venous sheath has been removed from the IVC.  No pneumothorax is seen.  The lungs are relatively well aerated. Cardiomegaly is stable.  IMPRESSION: No active lung disease.  Stable cardiomegaly.   Original Report Authenticated By: Dwyane Dee,  M.D.     INR: Will add last result for INR, ABG once components are confirmed Will add last 4 CBG results once components are confirmed  Assessment/Plan: S/P Procedure(s) (LRB): CORONARY ARTERY BYPASS GRAFTING (CABG) (N/A) INTRAOPERATIVE TRANSESOPHAGEAL ECHOCARDIOGRAM (N/A)  1 Febrile- poss source early right LE cellulitis, will add IV ancef for now , check UA/C+S also 2 push rehab as able- he is very inactive, not doing pulm toilet well 3 cont diuresis, replace K+- cardiology ordered 4 sugars controlled 5 rhythm sinus, Qtc 455    LOS: 13 days    GOLD,WAYNE E 4/6/20148:08 AM  Fever last night with erythema and tenderness of right leg pretibial tissue from [facility]. White count normal  Patient also has rattly cough a chest x-ray fairly clear Minimal drainage from the lower part of sternal incision looks low risk Continued IV Ancef, followup blood cultures and urine culture

## 2013-01-15 NOTE — Progress Notes (Signed)
0430: pt temp is 103 called dr.van trigt orders were given via terry wagnor rn.
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
## 2013-01-16 LAB — BASIC METABOLIC PANEL
BUN: 12 mg/dL (ref 6–23)
CO2: 26 mEq/L (ref 19–32)
Chloride: 100 mEq/L (ref 96–112)
Creatinine, Ser: 0.8 mg/dL (ref 0.50–1.35)
GFR calc Af Amer: >90 mL/min (ref >90)
Potassium: 3.4 mEq/L — ABNORMAL LOW (ref 3.5–5.1)

## 2013-01-16 LAB — URINALYSIS, ROUTINE W REFLEX MICROSCOPIC
Bilirubin Urine: NEGATIVE
Leukocytes, UA: NEGATIVE
Nitrite: NEGATIVE
Specific Gravity, Urine: 1.025 (ref 1.005–1.030)
Urobilinogen, UA: 1 mg/dL (ref 0.0–1.0)
pH: 5.5 (ref 5.0–8.0)

## 2013-01-16 LAB — GLUCOSE, CAPILLARY
Glucose-Capillary: 100 mg/dL — ABNORMAL HIGH (ref 70–99)
Glucose-Capillary: 109 mg/dL — ABNORMAL HIGH (ref 70–99)

## 2013-01-16 MED ORDER — LEVALBUTEROL TARTRATE 45 MCG/ACT IN AERO
2.0000 | INHALATION_SPRAY | Freq: Three times a day (TID) | RESPIRATORY_TRACT | Status: DC
  Administered 2013-01-16 – 2013-01-18 (×7): 2 via RESPIRATORY_TRACT

## 2013-01-16 MED ORDER — POTASSIUM CHLORIDE CRYS ER 20 MEQ PO TBCR
40.0000 meq | EXTENDED_RELEASE_TABLET | Freq: Once | ORAL | Status: AC
  Administered 2013-01-16: 40 meq via ORAL
  Filled 2013-01-16: qty 2

## 2013-01-16 MED ORDER — POTASSIUM CHLORIDE CRYS ER 20 MEQ PO TBCR
20.0000 meq | EXTENDED_RELEASE_TABLET | Freq: Once | ORAL | Status: AC
  Administered 2013-01-16: 20 meq via ORAL
  Filled 2013-01-16: qty 1

## 2013-01-16 NOTE — Progress Notes (Addendum)
Pt remains with no bed offers at this time. Covering CSW has updated pt FL2/clinicals and resubmitted them to facilities.   *CSW visited pt room and spoke with pt and wife [name] [phone]) and informed them that [facility] is considering placement at this moment. CSW to remain following and will await decision from [facility] in [place].  Theresia Bough, MSW, [facility] [phone]

## 2013-01-16 NOTE — Progress Notes (Signed)
Physical Therapy Treatment Patient Details Name: [name] MRN: [id number] DOB: 1951-06-18 Today's Date: 01/16/2013 Time: 4098-1191 PT Time Calculation (min): 25 min  PT Assessment / Plan / Recommendation Comments on Treatment Session  Pt very loud and demanding today. Cooperative with activity but aggitated. Pt with multiple bouts of coughing during session, sounds congested, continued to encourage upright positioning in chair but patient refused. Pt demanding to get back to bed. Patient extremely impulsive with all activities. WIll continue to work with patient to progress ambulation and activity as tolerated. Rec continued rehab upon discharge.    Follow Up Recommendations  CIR (If not CIR appropriate then pt may need SNF)     Does the patient have the potential to tolerate intense rehabilitation     Barriers to Discharge        Equipment Recommendations  None recommended by PT;Other (comment)    Recommendations for Other Services OT consult  Frequency Min 3X/week   Plan Discharge plan remains appropriate    Precautions / Restrictions Precautions Precautions: Sternal;Fall Restrictions Weight Bearing Restrictions: Yes (pt needs continued reminder for sternal precaution )   Pertinent Vitals/Pain 8/10    Mobility  Bed Mobility Bed Mobility: Sit to Supine;Scooting to HOB Sit to Supine: 3: Mod assist Scooting to [facility]: 3: Mod assist Details for Bed Mobility Assistance: VC,s for control; pt very impulsive and mildly agitated this am Transfers Transfers: Sit to Stand;Stand to Sit Sit to Stand: 1: +2 Total assist;From bed;From chair/3-in-1 Sit to Stand: Patient Percentage: 70% Stand to Sit: 1: +2 Total assist;To bed;To chair/3-in-1 Stand to Sit: Patient Percentage: 80% Details for Transfer Assistance: Max Cues for sternal precautions. Max cues for control with no carryover, patient not very cooperative or calm this am. Pt very demanding and extremely impulsive with transfers,  throwing himself into bed. Ambulation/Gait Ambulation/Gait Assistance: 1: +2 Total assist Ambulation/Gait: Patient Percentage: 70% Ambulation Distance (Feet): 52 Feet Assistive device: Straight cane;1 person hand held assist Ambulation/Gait Assistance Details: Pt with hurried impulsive gait causing unsteadiness with ambulation. Patient with staggering gait and bilateral support and assist to guard patient and control balance. Gait Pattern: Decreased weight shift to left;Shuffle;Narrow base of support Gait velocity: impulsively fast and unsteady General Gait Details: Pt with max cues to direct to task and focus on walking, pt very loud and irratated during all activity but willing to perform with goal of getting back in bed. Stairs: No    Exercises Total Joint Exercises Ankle Circles/Pumps: AROM;Both;20 reps Straight Leg Raises: AROM;Strengthening;Both;10 reps;Supine Long Arc Quad: AROM;Both;10 reps General Exercises - Lower Extremity Hip Flexion/Marching: AROM;Both;5 reps;Seated (Seated EOB after 2nd transfer to bed)     PT Goals Acute Rehab PT Goals PT Goal Formulation: With patient Time For Goal Achievement: 01/18/13 Potential to Achieve Goals: Good Pt will go Supine/Side to Sit: with supervision;Other (comment) (With compliance to sternal precautions) PT Goal: Supine/Side to Sit - Progress: Progressing toward goal Pt will go Stand to Sit: with modified independence PT Goal: Stand to Sit - Progress: Progressing toward goal Pt will Transfer Bed to Chair/Chair to Bed: with modified independence PT Transfer Goal: Bed to Chair/Chair to Bed - Progress: Progressing toward goal Pt will Ambulate: >150 feet;with supervision;with least restrictive assistive device PT Goal: Ambulate - Progress: Progressing toward goal  Visit Information  Last PT Received On: 01/16/13 Assistance Needed: +2    Subjective Data  Subjective: Put me back in bed Patient Stated Goal: to go home    Cognition  Cognition  Overall Cognitive Status: Impaired Area of Impairment: Safety/judgement Arousal/Alertness: Awake/alert Behavior During Session: WFL for tasks performed Safety/Judgement: Impulsive Cognition - Other Comments: h/o TBI and CVA    Balance  Static Sitting Balance Static Sitting - Balance Support: Feet supported;No upper extremity supported Static Sitting - Level of Assistance: 5: Stand by assistance (Improvements in trunk control today) Static Sitting - Comment/# of Minutes: Pt with coughing spell prior to activity seated edge of chair 6 minutes with verbal cues to help calm patient  Static Standing Balance Static Standing - Balance Support: Bilateral upper extremity supported;During functional activity Static Standing - Level of Assistance: 3: Mod assist Static Standing - Comment/# of Minutes: Pt standing to apply arm brace, patient with Max VCs to calm irriation during standing activity  End of Session PT - End of Session Equipment Utilized During Treatment: Gait belt Activity Tolerance: Patient limited by fatigue;Patient limited by pain Patient left: in bed;with call bell/phone within reach;with family/visitor present Nurse Communication: Mobility status   GP     Fabio Asa 01/16/2013, 10:32 AM Charlotte Crumb, PT DPT  [phone]

## 2013-01-16 NOTE — Progress Notes (Signed)
CARDIAC REHAB PHASE I   PRE:  Rate/Rhythm: 89SR  BP:  Supine: 120/70  Sitting:   Standing:    SaO2: 92%RA  MODE:  Ambulation: 32 outside of room  ft   POST:  Rate/Rhythm: 123  BP:  Supine:   Sitting: 120/70  Standing:    SaO2: 91-92%RA 0810-0835 Pt walked 32 ft outside of door with gait belt, cane, and asst x 2.  Cleaned pt up from stool prior to walk. Pt c/o pain right leg. To recliner after walk. Sounds congested. Encouraged IS and flutter valve. Call bell in reach and wife in room. PT to see today also.   Luetta Nutting, RN BSN  01/16/2013 8:36 AM   [phone]

## 2013-01-16 NOTE — Progress Notes (Addendum)
                   [address]            [place] [address]          [phone]      7 Days Post-Op Procedure(s) (LRB): CORONARY ARTERY BYPASS GRAFTING (CABG) (N/A) INTRAOPERATIVE TRANSESOPHAGEAL ECHOCARDIOGRAM (N/A)  Subjective: Patient   Objective: Vital signs in last 24 hours: Temp:  [97.9 F (36.6 C)-99.1 F (37.3 C)] 98.9 F (37.2 C) (04/07 0348) Pulse Rate:  [81-87] 84 (04/07 0348) Cardiac Rhythm:  [-] Normal sinus rhythm (04/06 1945) Resp:  [18] 18 (04/07 0348) BP: (89-98)/(47-68) 98/68 mmHg (04/07 0415) SpO2:  [92 %-94 %] 93 % (04/07 0348) Weight:  [121 kg (266 lb 12.1 oz)] 121 kg (266 lb 12.1 oz) (04/07 0348)  Pre op weight 119 kg Current Weight  01/16/13 121 kg (266 lb 12.1 oz)        Physical Exam:  Cardiovascular: RRR Pulmonary:Diminshed at bases; no rales, wheezes, or rhonchi. Abdomen: Soft, non tender, bowel sounds present. Extremities: Bilateral lower extremity edema L>R Wounds: Clean and dry.  No erythema or signs of infection. Erythema right lower shin area. Ecchymosis right calf and thigh Lab Results: CBC:  Recent Labs  01/14/13 0502 01/15/13 0535  WBC 7.8 10.2  HGB 9.5* 9.4*  HCT 27.9* 27.5*  PLT 169 220   BMET:   Recent Labs  01/14/13 0502 01/16/13 0553  NA 136 136  K 3.4* 3.4*  CL 102 100  CO2 26 26  GLUCOSE 136* 94  BUN 13 12  CREATININE 0.85 0.80  CALCIUM 8.2* 8.2*    PT/INR:  Lab Results  Component Value Date   INR 1.59* 01/09/2013   INR 1.03 01/03/2013   ABG:  INR: Will add last result for INR, ABG once components are confirmed Will add last 4 CBG results once components are confirmed  Assessment/Plan:  1. CV - s/p NSTEMI.ST. On Amiodarone 200 bid, and Lopressor 25 bid. SBP in the hight 90's. Monitor as may need to decrease Lopressor. 2.  Pulmonary - Wean oxygen (on 1 liter via Stacy) as tolerates.Encourage incentive spirometer 3. Volume Overload - On Lasix 40 daily 4.  Acute blood loss anemia  - Last H and H stable at 9.4 and 27.5. Had transfusion on Friday. On Niferex and folic acid.  5.Supplement potassium 6.DM-CBGs 140/84/141. Continue Tradjenta 5 daily 7.Had fever to 103 over weekend. WBC normal. On Ancef.Has tenderness over right tibia (below EVH site-likely hematoma/cellulitis). UA was negative for leukocytes.   ZIMMERMAN,DONIELLE MPA-C 01/16/2013,7:48 AM  patient examined and medical record reviewed,agree with above note. VAN TRIGT III,PETER 01/16/2013

## 2013-01-16 NOTE — Progress Notes (Signed)
Clinical Child psychotherapist (CSW) informed pt that [facility] has offered placement. Pt and wife agreeable and appreciative.  Theresia Bough, MSW, [facility] [phone]

## 2013-01-16 NOTE — Progress Notes (Signed)
Removed epicardial wires (three intact).  Frequent vitals documented.  Pt tolerated procedure well.  Pt resting in room with call bell within reach.  Bed rest for one hour.  No ectopic beats noted.  Will continue to monitor. Continue to encourage pt to use incentive spirometer and flutter valve.  Pt and wife state he is using but has never been able to expectorate phlegm. Thomas Hoff

## 2013-01-17 LAB — URINE CULTURE

## 2013-01-17 LAB — GLUCOSE, CAPILLARY
Glucose-Capillary: 130 mg/dL — ABNORMAL HIGH (ref 70–99)
Glucose-Capillary: 104 mg/dL — ABNORMAL HIGH (ref 70–99)
Glucose-Capillary: 130 mg/dL — ABNORMAL HIGH (ref 70–99)

## 2013-01-17 MED ORDER — POTASSIUM CHLORIDE CRYS ER 20 MEQ PO TBCR
20.0000 meq | EXTENDED_RELEASE_TABLET | Freq: Once | ORAL | Status: AC
  Administered 2013-01-17: 20 meq via ORAL

## 2013-01-17 MED ORDER — VANCOMYCIN HCL 10 G IV SOLR
1250.0000 mg | Freq: Two times a day (BID) | INTRAVENOUS | Status: DC
  Administered 2013-01-17 – 2013-01-19 (×5): 1250 mg via INTRAVENOUS
  Filled 2013-01-17 (×6): qty 1250

## 2013-01-17 NOTE — Progress Notes (Addendum)
[address]       [place] [address]             [phone]    8 Days Post-Op  Procedure(s) (LRB): CORONARY ARTERY BYPASS GRAFTING (CABG) (N/A) INTRAOPERATIVE TRANSESOPHAGEAL ECHOCARDIOGRAM (N/A) Subjective: Feels ok, right leg is sore  Objective  Telemetry sinus rhythm  Temp:  [97.4 F (36.3 C)-100.5 F (38.1 C)] 97.4 F (36.3 C) (04/08 0446) Pulse Rate:  [82-99] 82 (04/08 0446) Resp:  [20-21] 20 (04/08 0446) BP: (90-123)/(62-84) 98/71 mmHg (04/08 0446) SpO2:  [93 %-96 %] 93 % (04/08 0446) Weight:  [263 lb 14.3 oz (119.7 kg)] 263 lb 14.3 oz (119.7 kg) (04/08 0446)   Intake/Output Summary (Last 24 hours) at 01/17/13 0825 Last data filed at 01/17/13 0600  Gross per 24 hour  Intake    580 ml  Output      0 ml  Net    580 ml       General appearance: alert, cooperative and no distress Heart: regular rate and rhythm Lungs: some upper airway congestion, improves with cough Abdomen: soft, nontender Extremities: mild edema Wound: some pretibial erethema, stable  Lab Results:  Recent Labs  01/16/13 0553  NA 136  K 3.4*  CL 100  CO2 26  GLUCOSE 94  BUN 12  CREATININE 0.80  CALCIUM 8.2*   No results found for this basename: AST, ALT, ALKPHOS, BILITOT, PROT, ALBUMIN,  in the last 72 hours No results found for this basename: LIPASE, AMYLASE,  in the last 72 hours  Recent Labs  01/15/13 0535  WBC 10.2  NEUTROABS 8.0*  HGB 9.4*  HCT 27.5*  MCV 86.8  PLT 220   No results found for this basename: CKTOTAL, CKMB, TROPONINI,  in the last 72 hours No components found with this basename: POCBNP,  No results found for this basename: DDIMER,  in the last 72 hours No results found for this basename: HGBA1C,  in the last 72 hours No results found for this basename: CHOL, HDL, LDLCALC, TRIG, CHOLHDL,  in the last 72 hours No results found for this basename: TSH, T4TOTAL, FREET3, T3FREE, THYROIDAB,  in the last 72 hours No results found for this  basename: VITAMINB12, FOLATE, FERRITIN, TIBC, IRON, RETICCTPCT,  in the last 72 hours  Medications: Scheduled . amiodarone  200 mg Oral BID  . aspirin EC  325 mg Oral Daily  . atorvastatin  20 mg Oral QHS  .  ceFAZolin (ANCEF) IV  1 g Intravenous Q8H  . docusate sodium  200 mg Oral Daily  . enoxaparin (LOVENOX) injection  30 mg Subcutaneous QHS  . finasteride  5 mg Oral Daily  . folic acid  1 mg Oral Daily  . furosemide  40 mg Oral Daily  . gabapentin  100 mg Oral TID  . insulin aspart  0-24 Units Subcutaneous TID AC & HS  . insulin detemir  22 Units Subcutaneous Q1200  . iron polysaccharides  150 mg Oral Daily  . levalbuterol  2 puff Inhalation TID  . linagliptin  5 mg Oral Daily  . lisinopril  2.5 mg Oral Daily  . loratadine  10 mg Oral Daily  . metoprolol tartrate  25 mg Oral BID  . PARoxetine  20 mg Oral Daily  . potassium chloride  20 mEq Oral Daily  . rOPINIRole  0.25 mg Oral QHS  . sodium chloride  3 mL Intravenous Q12H  . tamsulosin  0.4 mg Oral Daily  Radiology/Studies:  No results found.  INR: Will add last result for INR, ABG once components are confirmed Will add last 4 CBG results once components are confirmed  Assessment/Plan: S/P Procedure(s) (LRB): CORONARY ARTERY BYPASS GRAFTING (CABG) (N/A) INTRAOPERATIVE TRANSESOPHAGEAL ECHOCARDIOGRAM (N/A)  1 low grade temp, tmax 100.5 on ancef for cellulitis 2 push rehab/pulmm toilet 3 sinus rhythm 4 gentle diuresis, replace K+ 5 urine culture pend, blood cultures pending cbg's ok control  LOS: 15 days    GOLD,WAYNE E 4/8/20148:25 AM  Patient seen and examined. Agree with above Persistent low grade temps and cellulitis RLE, will change from ancef to [facility]

## 2013-01-17 NOTE — Progress Notes (Signed)
PATIENT HAS A REDDEN AREA ON HIS RIGHT LOWER LEG WARM TO TOUCH AND TENDER TO TOUCH. WIFE STATES IT WAS NOT THERE BEFORE. PATIENT IS ON ANTIBIOTIC.

## 2013-01-17 NOTE — Progress Notes (Signed)
PT. Placed on CPAP via nasal mask, auto titrate settings (min 5.0  Max 18.0) with 2 lpm O2 bleed in.  Pt. Tolerating well. RN aware.
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
## 2013-01-17 NOTE — Progress Notes (Signed)
CARDIAC REHAB PHASE I   PRE:  Rate/Rhythm: 93 SR    BP: lying 88/66, sitting 110/70    SaO2: 94 RA  MODE:  Ambulation: 60 ft   POST:  Rate/Rhythm: 126 ST    BP: sitting 114/70     SaO2: 94 RA  BP low in bed but better on EOB. Pt is progressing with his walking but continues to struggle with correct mechanics and sternal precautions. Pt stood without gait belt and was very unsteady and also put weight on right arm. Pt also insists on turning 270 degrees to sit in chair instead of 90 degrees. Then plops into chair, uncontrolled. HR up to 126 ST walking. Pt c/o left leg "pulling" down. Also c/o left finger/arm feeling weak. Could not tell if pt was forgetting about stroke (confused?) or if he felt his left side was weaker than normal. Used cane and gait belt, assist x2 to walk. [id number]  Harriet Masson CES, ACSM 01/17/2013 9:31 AM

## 2013-01-18 LAB — CBC
HCT: 29.3 % — ABNORMAL LOW (ref 39.0–52.0)
Hemoglobin: 9.9 g/dL — ABNORMAL LOW (ref 13.0–17.0)
MCH: 29.8 pg (ref 26.0–34.0)
MCHC: 33.8 g/dL (ref 30.0–36.0)
MCV: 88.3 fL (ref 78.0–100.0)
Platelets: 259 10*3/uL (ref 150–400)
RBC: 3.32 MIL/uL — ABNORMAL LOW (ref 4.22–5.81)
RDW: 15.3 % (ref 11.5–15.5)
WBC: 6.3 10*3/uL (ref 4.0–10.5)

## 2013-01-18 LAB — BASIC METABOLIC PANEL
BUN: 9 mg/dL (ref 6–23)
CO2: 28 mEq/L (ref 19–32)
Calcium: 8 mg/dL — ABNORMAL LOW (ref 8.4–10.5)
Chloride: 103 mEq/L (ref 96–112)
Creatinine, Ser: 0.78 mg/dL (ref 0.50–1.35)
GFR calc Af Amer: >90 mL/min (ref >90)
GFR calc non Af Amer: >90 mL/min (ref >90)
Glucose, Bld: 98 mg/dL (ref 70–99)
Potassium: 4 mEq/L (ref 3.5–5.1)
Sodium: 139 mEq/L (ref 135–145)

## 2013-01-18 LAB — GLUCOSE, CAPILLARY
Glucose-Capillary: 91 mg/dL (ref 70–99)
Glucose-Capillary: 119 mg/dL — ABNORMAL HIGH (ref 70–99)
Glucose-Capillary: 80 mg/dL (ref 70–99)

## 2013-01-18 MED ORDER — LEVALBUTEROL TARTRATE 45 MCG/ACT IN AERO
2.0000 | INHALATION_SPRAY | Freq: Two times a day (BID) | RESPIRATORY_TRACT | Status: DC
  Administered 2013-01-18 – 2013-01-19 (×2): 2 via RESPIRATORY_TRACT
  Filled 2013-01-18: qty 15

## 2013-01-18 NOTE — Progress Notes (Signed)
9 Days Post-Op Procedure(s) (LRB): CORONARY ARTERY BYPASS GRAFTING (CABG) (N/A) INTRAOPERATIVE TRANSESOPHAGEAL ECHOCARDIOGRAM (N/A) Subjective:  Mr. Labonte states his leg is feeling much better.   Objective: Vital signs in last 24 hours: Temp:  [97.6 F (36.4 C)-99.1 F (37.3 C)] 98.1 F (36.7 C) (04/09 0407) Pulse Rate:  [78-100] 78 (04/09 0407) Cardiac Rhythm:  [-] Normal sinus rhythm (04/08 2000) Resp:  [18-20] 20 (04/09 0407) BP: (108-123)/(66-97) 108/66 mmHg (04/09 0407) SpO2:  [85 %-100 %] 100 % (04/09 0407) Weight:  [262 lb 12.6 oz (119.2 kg)] 262 lb 12.6 oz (119.2 kg) (04/09 0500)  Intake/Output from previous day: 04/08 0701 - 04/09 0700 In: 800 [P.O.:600; IV Piggyback:200] Out: 1150 [Urine:1150]  General appearance: alert, cooperative and no distress Heart: regular rate and rhythm Lungs: clear to auscultation bilaterally Abdomen: soft, non-tender; bowel sounds normal; no masses,  no organomegaly Extremities: edema trace Wound: clean and dry, erythema improved  Lab Results:  Recent Labs  01/18/13 0600  WBC 6.3  HGB 9.9*  HCT 29.3*  PLT 259   BMET:  Recent Labs  01/16/13 0553 01/18/13 0600  NA 136 139  K 3.4* 4.0  CL 100 103  CO2 26 28  GLUCOSE 94 98  BUN 12 9  CREATININE 0.80 0.78  CALCIUM 8.2* 8.0*    PT/INR: No results found for this basename: LABPROT, INR,  in the last 72 hours ABG    Component Value Date/Time   PHART 7.366 01/09/2013 2018   HCO3 20.8 01/09/2013 2018   TCO2 19 01/10/2013 1713   ACIDBASEDEF 4.0* 01/09/2013 2018   O2SAT 88.0 01/09/2013 2018   CBG (last 3)   Recent Labs  01/17/13 1614 01/17/13 2049 01/18/13 0621  GLUCAP 104* 130* 91    Assessment/Plan: S/P Procedure(s) (LRB): CORONARY ARTERY BYPASS GRAFTING (CABG) (N/A) INTRAOPERATIVE TRANSESOPHAGEAL ECHOCARDIOGRAM (N/A)  1. CV- NSR rate and pressure controlled on Lopressor, Lisinopril, Amiodarone 2. RLE cellulitis- improved, fever resolved on IV Vancomycin 3.  Renal- creatinine WNL, Lytes ok, volume status stable, continue diuresis 4. DM- CBGs controlled 5. Deconditioning- needs SNF at discharge 6. Dispo- cellulitis improving, need to attempt and wean oxygen, cultures are negative- possibly ready for SNF in next 24-48 hours need to decide on ABX regimen for discharge   LOS: 16 days    Lochlyn Zullo 01/18/2013

## 2013-01-18 NOTE — Progress Notes (Signed)
Physical Therapy Treatment Patient Details Name: [name] MRN: [id number] DOB: 16-Oct-1950 Today's Date: 01/18/2013 Time: 7829-5621 PT Time Calculation (min): 39 min  PT Assessment / Plan / Recommendation Comments on Treatment Session  Pt mobility improving.     Follow Up Recommendations  CIR     Does the patient have the potential to tolerate intense rehabilitation     Barriers to Discharge        Equipment Recommendations  None recommended by PT;Other (comment)    Recommendations for Other Services OT consult  Frequency Min 3X/week   Plan Discharge plan remains appropriate    Precautions / Restrictions Precautions Precautions: Sternal;Fall Required Braces or Orthoses: Other Brace/Splint Other Brace/Splint: Lt double upright AFO Restrictions Weight Bearing Restrictions: Yes   Pertinent Vitals/Pain 8/10 pain in chest and back.  Pt medicated just prior to start of session. No c/o pain at end of session.     01/18/13 1146 01/18/13 1213  Vital Signs  Pulse Rate 88 96  Pulse Rate Source Dinamap;Right Dinamap  BP ! 85/59 mmHg 115/79 mmHg  BP Location Right arm Right arm  BP Method Automatic Automatic  Patient Position, if appropriate Lying Sitting     Mobility  Bed Mobility Bed Mobility: Supine to Sit;Sitting - Scoot to Edge of Bed Rolling Left: Not tested (comment) Left Sidelying to Sit: Not tested (comment) Supine to Sit: 3: Mod assist;HOB flat Supine to Sit: Patient Percentage: 70% Sitting - Scoot to Edge of Bed: 4: Min assist Sit to Supine: Not Tested (comment) Scooting to [facility]: Not tested (comment) Details for Bed Mobility Assistance: Assist to raise trunk from bed.   Transfers Transfers: Sit to Stand;Stand to Sit Sit to Stand: 4: Min assist;From bed;With upper extremity assist Sit to Stand: Patient Percentage: 80% Stand to Sit: 4: Min assist;To chair/3-in-1;With upper extremity assist Stand to Sit: Patient Percentage: 80% Details for Transfer  Assistance: cues for sternal precautions, assist for controlled descent to chair, assist to raise hips from bed.  Ambulation/Gait Ambulation/Gait Assistance: 4: Min assist Ambulation/Gait: Patient Percentage: 80% Ambulation Distance (Feet): 100 Feet Assistive device: Straight cane;1 person hand held assist Ambulation/Gait Assistance Details: Pt c/o fatigue resting several times while ambulating.  Pt's gait and balance improved and AFO appears to be stabilizing foot/ankle better.   Gait Pattern: Decreased weight shift to left;Shuffle;Narrow base of support Gait velocity: impulsively fast and unsteady General Gait Details: Pt with max cues to direct to task and focus on walking, pt very loud and irratated during all activity but willing to perform with goal of getting back in bed. Stairs: No    Exercises     PT Diagnosis:    PT Problem List:   PT Treatment Interventions:     PT Goals Acute Rehab PT Goals PT Goal Formulation: With patient Time For Goal Achievement: 01/18/13 Potential to Achieve Goals: Good Pt will go Supine/Side to Sit: with supervision;Other (comment) PT Goal: Supine/Side to Sit - Progress: Progressing toward goal Pt will go Stand to Sit: with modified independence PT Goal: Stand to Sit - Progress: Progressing toward goal Pt will Transfer Bed to Chair/Chair to Bed: with modified independence PT Transfer Goal: Bed to Chair/Chair to Bed - Progress: Progressing toward goal Pt will Ambulate: >150 feet;with supervision;with least restrictive assistive device PT Goal: Ambulate - Progress: Progressing toward goal  Visit Information  Last PT Received On: 01/18/13    Subjective Data  Subjective: No new c/o   Patient Stated Goal: to go home  Cognition  Cognition Overall Cognitive Status: Appears within functional limits for tasks assessed/performed Arousal/Alertness: Awake/alert Orientation Level: Appears intact for tasks assessed Behavior During Session: [facility] for  tasks performed Safety/Judgement: Impulsive Cognition - Other Comments: h/o TBI and CVA    Balance  Static Sitting Balance Static Sitting - Balance Support: Feet supported;No upper extremity supported Static Sitting - Level of Assistance: 6: Modified independent (Device/Increase time) Static Standing Balance Static Standing - Balance Support: Right upper extremity supported Static Standing - Level of Assistance: 4: Min assist Static Standing - Comment/# of Minutes: 2 minutes with mild a-p sway.    End of Session PT - End of Session Equipment Utilized During Treatment: Gait belt Activity Tolerance: Patient limited by fatigue;Patient limited by pain Patient left: in chair;with call bell/phone within reach Nurse Communication: Mobility status   GP     Josseline Reddin 01/18/2013, 12:51 PM

## 2013-01-18 NOTE — Progress Notes (Signed)
1300  Pt just walked with PT and is tired now. We will follow up tomorrow.Luetta Nutting RNBSN

## 2013-01-18 NOTE — Progress Notes (Signed)
Nurse did not administer the patient's levemir because patient did not eat any of his dinner, and refused to eat a good portion of his snack. Pt CBG was 80. The nurse felt the patient's CBG would bottom out if levemir was administered. Harmon Pier

## 2013-01-19 DIAGNOSIS — Z951 Presence of aortocoronary bypass graft: Secondary | ICD-10-CM

## 2013-01-19 LAB — GLUCOSE, CAPILLARY: Glucose-Capillary: 131 mg/dL — ABNORMAL HIGH (ref 70–99)

## 2013-01-19 MED ORDER — ASPIRIN 325 MG PO TBEC: 325.0000 mg | DELAYED_RELEASE_TABLET | Freq: Every day | ORAL | Status: DC

## 2013-01-19 MED ORDER — AMIODARONE HCL 200 MG PO TABS: 200.0000 mg | ORAL_TABLET | Freq: Two times a day (BID) | ORAL | Status: DC

## 2013-01-19 MED ORDER — POTASSIUM CHLORIDE CRYS ER 20 MEQ PO TBCR: 20.0000 meq | EXTENDED_RELEASE_TABLET | Freq: Every day | ORAL | Status: DC

## 2013-01-19 MED ORDER — CEPHALEXIN 500 MG PO CAPS: 500.0000 mg | ORAL_CAPSULE | Freq: Four times a day (QID) | ORAL | Status: DC

## 2013-01-19 MED ORDER — METOPROLOL TARTRATE 25 MG PO TABS: 25.0000 mg | ORAL_TABLET | Freq: Two times a day (BID) | ORAL | Status: DC

## 2013-01-19 MED ORDER — TRAMADOL HCL 50 MG PO TABS: 50.0000 mg | ORAL_TABLET | ORAL | Status: DC | PRN

## 2013-01-19 MED ORDER — LEVALBUTEROL TARTRATE 45 MCG/ACT IN AERO: 2.0000 | INHALATION_SPRAY | Freq: Two times a day (BID) | RESPIRATORY_TRACT | Status: DC

## 2013-01-19 MED ORDER — FUROSEMIDE 40 MG PO TABS: 40.0000 mg | ORAL_TABLET | Freq: Every day | ORAL | Status: DC

## 2013-01-19 MED ORDER — OXYCODONE HCL 5 MG PO TABS: 5.0000 mg | ORAL_TABLET | ORAL | Status: DC | PRN

## 2013-01-19 MED ORDER — LISINOPRIL 2.5 MG PO TABS: 2.5000 mg | ORAL_TABLET | Freq: Every day | ORAL | Status: DC

## 2013-01-19 MED ORDER — GUAIFENESIN ER 600 MG PO TB12
1200.0000 mg | ORAL_TABLET | Freq: Two times a day (BID) | ORAL | Status: DC
  Administered 2013-01-19: 1200 mg via ORAL
  Filled 2013-01-19 (×2): qty 2

## 2013-01-19 MED ORDER — FINASTERIDE 5 MG PO TABS: 5.0000 mg | ORAL_TABLET | Freq: Every day | ORAL | Status: DC

## 2013-01-19 MED ORDER — POLYSACCHARIDE IRON COMPLEX 150 MG PO CAPS: 150.0000 mg | ORAL_CAPSULE | Freq: Every day | ORAL | Status: DC

## 2013-01-19 MED ORDER — DSS 100 MG PO CAPS: 200.0000 mg | ORAL_CAPSULE | Freq: Two times a day (BID) | ORAL | Status: DC | PRN

## 2013-01-19 NOTE — Progress Notes (Signed)
Pt O2 sats 92% on room air while at rest.

## 2013-01-19 NOTE — Progress Notes (Signed)
Discharge instructions given to pt and called wife informing her that pt was being transferred to [facility] today. Removed pt's chest tube sutures, placed steri strips and benzoin over site. Pt is stable for discharge with EMS. Called report to [facility].

## 2013-01-19 NOTE — Discharge Summary (Signed)
Physician Discharge Summary  Patient ID: [name] MRN: [id number] DOB/AGE: 1950/10/15 62 y.o.  Admit date: 01/02/2013 Discharge date: 01/19/2013  Admission Diagnoses:  Patient Active Problem List  Diagnosis  . CVA (cerebral infarction)  . Atypical chest pain  . Diabetes mellitus  . Hypertension  . Occlusion and stenosis of carotid artery without mention of cerebral infarction  . Atrial flutter  . Acute myocardial infarction, subendocardial infarction, initial episode of care   Discharge Diagnoses:   Patient Active Problem List  Diagnosis  . CVA (cerebral infarction)  . Atypical chest pain  . Diabetes mellitus  . Hypertension  . Occlusion and stenosis of carotid artery without mention of cerebral infarction  . Atrial flutter  . Acute myocardial infarction, subendocardial infarction, initial episode of care  . S/P CABG x 5   Discharged Condition: good  History of Present Illness:   [name] is a 62 yo morbidly obese white male with known history of Hypertension, Dyslipidemia, Type 2 Diabetes Mellitus, H/O CVA, Traumatic Brain injury, CAD, and nicotine abuse.  The patient developed sharp substernal chest pain with radiation down his left arm.  This last for approximately 3-4 hours with associated shortness of breath, nausea, and diaphoresis.   The patient states he took Norco and his PPI neither of which provided him any relief.  He presented to the [facility] Emergency Department at which time EKG was obtained and showed the patient to be suffering  Atrial Tachycardia vs Atrial Flutter.  His Troponin level was elevated.  He was placed on Heparin and Cardizem drip.  He was also give full dose ASA and transferred to [facility] for further evaluation.   Hospital Course:   Upon arrival he was admitted by the Cardiology service.  The patient's chest pain had resolved and he was medically stable.  He was taken for cardiac catheterization which revealed a reduced EF of  35% and severe three vessel CAD with Left Main Involvement.  It was felt he would benefit from coronary bypass and TCTS was consulted.  The patient was evaluated by Dr. Tyrone Sage at which time it was felt he would benefit from CABG procedure.  The risks and benefits of the procedure were explained to the patient and his wife and he was agreeable to proceed.  He was on Plavix prior to admission and surgery would need to be delayed until Plavix would be out of his system.  He remained chest pain free prior to his surgery.  He was taken to the operating room on 01/09/2013.  He underwent CABG x 5 utilizing LIMA to LAD, Sequential SVG to Diagonal and OM1, Sequential SVG to Distal Circumflex and RCA.  He also underwent Endoscopic Saphenous vein harvest from the right leg.  He tolerated the procedure well and was taken to the SICU in stable condition.  The patient was extubated the evening of surgery.  During his stay in the ICU the patient was weaned off all cardiac drips.   His chest tubes and arterial lines were removed without difficulty.  The patient's blood sugars were elevated and he was treated with insulin drip.  He suffered from hypervolemia and was treated with aggressive IV diuresis.  The patient was transfused for expected blood loss anemia.  Once medically stable the patient was transferred to the step down unit in stable condition.  Patient developed fever and overall did not feel well.  His Right Lower extremity appeared to be cellulitic.  He was started on IV  Ancef and Urine and Blood Cultures were obtained.  The patient continued to remain Febrile on Ancef.  Therefore, this was discontinued and he was placed on Vancomycin with improvement of RLE cellulitis and resolution of fever.  He will be placed on oral antibiotics at discharge.  Urine and Blood culture results were both negative.  The patient's pacing wires were removed without difficulty.  The patient continues to make progress.  He is deconditioned  and has continually worked with PT throughout his hospital stay.  It is felt the patient would benefit from SNF placement.  He is medically stable at this time and is ready for discharge once SNF bed is available.  He will need to follow up with Dr. Tyrone Sage in 3 weeks with a CXR.  He will also need to follow up with Dr. Jens Som in 2-4 weeks.      Consults: cardiology  Significant Diagnostic Studies: angiography:   Hemodynamic Findings:  Central aortic pressure: 144/73  Left ventricular pressure: 135/18/28  Angiographic Findings:  Left main: Distal 70-80% stenosis.  Left Anterior Descending Artery: Large caliber vessel that courses to the apex. The proximal vessel has 40% stenosis. The mid vessel has a 90% stenosis just before a large septal perforating branch. The remainder of the vessel has mild diffuse disease. The diagonal is moderate in caliber with ostial 90% stenosis.  Circumflex Artery: Moderate to large caliber vessel with 100% proximal occlusion just after the takeoff of a large caliber first obtuse marginal branch. The distal Circumflex and the second obtuse marginal branch fills from right to left collaterals. The first obtuse marginal/intermediate branch is moderate in caliber and has proximal 60% stenosis.  Right Coronary Artery: Large, dominant vessel with 100% proximal occlusion. The mid and distal vessel fills from right to right bridging collaterals. The PDA is seen to fill from antegrade flow and from left to right collaterals.  Left Ventricular Angiogram: LVEF 35% with severe hypokinesis of the inferior wall.  Treatments: surgery:   Coronary artery bypass grafting x5 with the left  internal mammary to the left anterior descending coronary artery,  sequential reverse saphenous vein graft to the diagonal coronary artery  and first obtuse marginal coronary artery, reverse saphenous vein graft  sequentially to the distal right coronary artery and distal circumflex  with right  leg and thigh and calf endo vein harvesting.  Disposition: SNF   Future Appointments Provider Department Dept Phone   11/01/2013 11:00 AM [facility] [phone]   11/01/2013 11:40 AM Evern Bio, NP [facility] [phone]     Discharge Medications:     Medication List    STOP taking these medications       cyclobenzaprine 10 MG tablet  Commonly known as:  FLEXERIL     diltiazem 120 MG 24 hr capsule  Commonly known as:  CARDIZEM CD     HYDROcodone-acetaminophen 5-325 MG per tablet  Commonly known as:  NORCO/VICODIN     metFORMIN 1000 MG tablet  Commonly known as:  GLUCOPHAGE      TAKE these medications       ALPRAZolam 1 MG tablet  Commonly known as:  XANAX  Take 1 mg by mouth daily.     amiodarone 200 MG tablet  Commonly known as:  PACERONE  Take 1 tablet (200 mg total) by mouth 2 (two) times daily.     aspirin 325 MG EC tablet  Take 1 tablet (325 mg total)  by mouth daily.     atorvastatin 20 MG tablet  Commonly known as:  LIPITOR  Take 20 mg by mouth at bedtime.     cephALEXin 500 MG capsule  Commonly known as:  KEFLEX  Take 1 capsule (500 mg total) by mouth 4 (four) times daily. For 10 Days     diclofenac sodium 1 % Gel  Commonly known as:  VOLTAREN  Apply 1 application topically 2 (two) times daily. Apply to left shoulder     dicyclomine 10 MG capsule  Commonly known as:  BENTYL  Take 10 mg by mouth 4 (four) times daily -  before meals and at bedtime.     DSS 100 MG Caps  Take 200 mg by mouth 2 (two) times daily as needed for constipation.     esomeprazole 20 MG packet  Commonly known as:  NEXIUM  Take 20 mg by mouth daily.     finasteride 5 MG tablet  Commonly known as:  PROSCAR  Take 1 tablet (5 mg total) by mouth daily.     furosemide 40 MG tablet  Commonly known as:  LASIX  Take 1 tablet (40 mg total) by mouth daily. For 7 Days     gabapentin 100 MG capsule   Commonly known as:  NEURONTIN  Take 100 mg by mouth 3 (three) times daily.     GLUCOSAMINE PO  Take 1 capsule by mouth daily.     insulin aspart 100 UNIT/ML injection  Commonly known as:  novoLOG  Inject 5 Units into the skin 3 (three) times daily before meals.     insulin detemir 100 UNIT/ML injection  Commonly known as:  LEVEMIR  Inject 20 Units into the skin at bedtime.     iron polysaccharides 150 MG capsule  Commonly known as:  NIFEREX  Take 1 capsule (150 mg total) by mouth daily.     levalbuterol 45 MCG/ACT inhaler  Commonly known as:  XOPENEX HFA  Inhale 2 puffs into the lungs 2 (two) times daily.     linagliptin 5 MG Tabs tablet  Commonly known as:  TRADJENTA  Take 5 mg by mouth daily.     lisinopril 2.5 MG tablet  Commonly known as:  PRINIVIL,ZESTRIL  Take 1 tablet (2.5 mg total) by mouth daily.     loratadine 10 MG tablet  Commonly known as:  CLARITIN  Take 10 mg by mouth daily.     metoprolol tartrate 25 MG tablet  Commonly known as:  LOPRESSOR  Take 1 tablet (25 mg total) by mouth 2 (two) times daily.     oxyCODONE 5 MG immediate release tablet  Commonly known as:  Oxy IR/ROXICODONE  Take 1-2 tablets (5-10 mg total) by mouth every 4 (four) hours as needed.     PARoxetine 20 MG tablet  Commonly known as:  PAXIL  Take 20 mg by mouth daily.     potassium chloride SA 20 MEQ tablet  Commonly known as:  K-DUR,KLOR-CON  Take 1 tablet (20 mEq total) by mouth daily. For 7 Days     rOPINIRole 0.25 MG tablet  Commonly known as:  REQUIP  Take 0.25 mg by mouth 2 (two) times daily.     tamsulosin 0.4 MG Caps  Commonly known as:  FLOMAX  Take 0.4 mg by mouth daily.     traMADol 50 MG tablet  Commonly known as:  ULTRAM  Take 1-2 tablets (50-100 mg total) by mouth every 4 (four) hours as needed.  The patient has been discharged on:   1.Beta Blocker:  Yes [ x  ]                              No   [   ]                              If No,  reason:  2.Ace Inhibitor/ARB: Yes [ x  ]                                     No  [    ]                                     If No, reason:  3.Statin:   Yes [  x ]                  No  [   ]                  If No, reason:  4.Ecasa:  Yes  [  x ]                  No   [   ]                  If No, reason:     Signed: Sony Schlarb 01/19/2013, 8:53 AM

## 2013-01-19 NOTE — Progress Notes (Signed)
Clinical Child psychotherapist (CSW) informed that pt is ready for discharge today. CSW has prepared and placed pt dc packet in pt shadow chart. Pt remains agreeable and pt wife has been notified. CSW has given RN the facility contact number. CSW has contacted [facility]. No additional CSW needs, CSW signing off.  Theresia Bough, MSW, Theresia Majors [phone]

## 2013-01-19 NOTE — Progress Notes (Addendum)
10 Days Post-Op Procedure(s) (LRB): CORONARY ARTERY BYPASS GRAFTING (CABG) (N/A) INTRAOPERATIVE TRANSESOPHAGEAL ECHOCARDIOGRAM (N/A) Subjective:  [name] has no new complaints this morning.  He states his leg continues to feel better.   Objective: Vital signs in last 24 hours: Temp:  [97.9 F (36.6 C)-98.6 F (37 C)] 98.1 F (36.7 C) (04/10 0454) Pulse Rate:  [75-96] 75 (04/10 0454) Cardiac Rhythm:  [-] Normal sinus rhythm (04/09 1947) Resp:  [18-20] 20 (04/10 0454) BP: (85-115)/(59-79) 90/62 mmHg (04/10 0454) SpO2:  [94 %-100 %] 94 % (04/10 0454) Weight:  [263 lb 3.7 oz (119.4 kg)] 263 lb 3.7 oz (119.4 kg) (04/10 0454)  Intake/Output from previous day: 04/09 0701 - 04/10 0700 In: 243 [P.O.:240; I.V.:3] Out: 1050 [Urine:1050]  General appearance: alert, cooperative and no distress Neurologic: intact Heart: regular rate and rhythm Lungs: congestion, clears with cough Abdomen: soft, non-tender; bowel sounds normal; no masses,  no organomegaly Extremities: edema trace Wound: clean and dry, RLE ecchymotic  Lab Results:  Recent Labs  01/18/13 0600  WBC 6.3  HGB 9.9*  HCT 29.3*  PLT 259   BMET:  Recent Labs  01/18/13 0600  NA 139  K 4.0  CL 103  CO2 28  GLUCOSE 98  BUN 9  CREATININE 0.78  CALCIUM 8.0*    PT/INR: No results found for this basename: LABPROT, INR,  in the last 72 hours ABG    Component Value Date/Time   PHART 7.366 01/09/2013 2018   HCO3 20.8 01/09/2013 2018   TCO2 19 01/10/2013 1713   ACIDBASEDEF 4.0* 01/09/2013 2018   O2SAT 88.0 01/09/2013 2018   CBG (last 3)   Recent Labs  01/18/13 1624 01/18/13 2048 01/19/13 0606  GLUCAP 154* 80 111*    Assessment/Plan: S/P Procedure(s) (LRB): CORONARY ARTERY BYPASS GRAFTING (CABG) (N/A) INTRAOPERATIVE TRANSESOPHAGEAL ECHOCARDIOGRAM (N/A)  1. CV- NSR rate and pressure controlled on Lopressor, Lisinopril, Amiodarone 2. RLE Cellulitis- improved, afebrile continue Vancomycin 3. Cough- will order  Mucinex to help expectorate sputum 4. DM- CBGs controlled 5. Deconditioning- needs SNF 6. Dispo- patient weaned off oxygen, medically stable at this time.  Will clarify ABX with staff, plan for d/c today if SNF bed available   LOS: 17 days    [name] 01/19/2013  PAtient seen and examined. Agree with above To SNF today- would plan for another week of IV antibiotics and follow up with Dr. Tyrone Sage next week

## 2013-01-19 NOTE — Progress Notes (Signed)
Discussed watching diet, smoking cessation, sternal precautions and IS. Wife not able to be present for education. Pt agreed to not smoke. N/a for ex. [id number] Ethelda Chick CES, ACSM 1:07 PM 01/19/2013

## 2013-01-21 LAB — CULTURE, BLOOD (ROUTINE X 2)
Culture: NO GROWTH
Culture: NO GROWTH

## 2013-02-02 ENCOUNTER — Other Ambulatory Visit: Payer: Self-pay | Admitting: *Deleted

## 2013-02-02 DIAGNOSIS — I2510 Atherosclerotic heart disease of native coronary artery without angina pectoris: Secondary | ICD-10-CM

## 2013-02-06 ENCOUNTER — Ambulatory Visit: Payer: Self-pay

## 2013-02-09 ENCOUNTER — Emergency Department (HOSPITAL_COMMUNITY)
Admission: EM | Admit: 2013-02-09 | Discharge: 2013-02-09 | Disposition: A | Discharge status: Home / Self Care | Payer: Medicaid Other | Attending: Emergency Medicine | Admitting: Emergency Medicine

## 2013-02-09 ENCOUNTER — Emergency Department (HOSPITAL_COMMUNITY): Payer: Medicaid Other

## 2013-02-09 ENCOUNTER — Encounter (HOSPITAL_COMMUNITY): Payer: Self-pay | Admitting: *Deleted

## 2013-02-09 DIAGNOSIS — Z8781 Personal history of (healed) traumatic fracture: Secondary | ICD-10-CM | POA: Insufficient documentation

## 2013-02-09 DIAGNOSIS — R109 Unspecified abdominal pain: Secondary | ICD-10-CM

## 2013-02-09 DIAGNOSIS — Y939 Activity, unspecified: Secondary | ICD-10-CM | POA: Insufficient documentation

## 2013-02-09 DIAGNOSIS — E119 Type 2 diabetes mellitus without complications: Secondary | ICD-10-CM | POA: Insufficient documentation

## 2013-02-09 DIAGNOSIS — I2510 Atherosclerotic heart disease of native coronary artery without angina pectoris: Secondary | ICD-10-CM | POA: Insufficient documentation

## 2013-02-09 DIAGNOSIS — S6980XA Other specified injuries of unspecified wrist, hand and finger(s), initial encounter: Secondary | ICD-10-CM | POA: Insufficient documentation

## 2013-02-09 DIAGNOSIS — I10 Essential (primary) hypertension: Secondary | ICD-10-CM | POA: Insufficient documentation

## 2013-02-09 DIAGNOSIS — M549 Dorsalgia, unspecified: Secondary | ICD-10-CM | POA: Insufficient documentation

## 2013-02-09 DIAGNOSIS — W19XXXA Unspecified fall, initial encounter: Secondary | ICD-10-CM

## 2013-02-09 DIAGNOSIS — Z7982 Long term (current) use of aspirin: Secondary | ICD-10-CM | POA: Insufficient documentation

## 2013-02-09 DIAGNOSIS — F17200 Nicotine dependence, unspecified, uncomplicated: Secondary | ICD-10-CM | POA: Insufficient documentation

## 2013-02-09 DIAGNOSIS — E785 Hyperlipidemia, unspecified: Secondary | ICD-10-CM | POA: Insufficient documentation

## 2013-02-09 DIAGNOSIS — Z8669 Personal history of other diseases of the nervous system and sense organs: Secondary | ICD-10-CM | POA: Insufficient documentation

## 2013-02-09 DIAGNOSIS — Z79899 Other long term (current) drug therapy: Secondary | ICD-10-CM | POA: Insufficient documentation

## 2013-02-09 DIAGNOSIS — G8929 Other chronic pain: Secondary | ICD-10-CM | POA: Insufficient documentation

## 2013-02-09 DIAGNOSIS — G4733 Obstructive sleep apnea (adult) (pediatric): Secondary | ICD-10-CM | POA: Insufficient documentation

## 2013-02-09 DIAGNOSIS — S6990XA Unspecified injury of unspecified wrist, hand and finger(s), initial encounter: Secondary | ICD-10-CM | POA: Insufficient documentation

## 2013-02-09 DIAGNOSIS — Z794 Long term (current) use of insulin: Secondary | ICD-10-CM | POA: Insufficient documentation

## 2013-02-09 DIAGNOSIS — S298XXA Other specified injuries of thorax, initial encounter: Secondary | ICD-10-CM | POA: Insufficient documentation

## 2013-02-09 DIAGNOSIS — S3981XA Other specified injuries of abdomen, initial encounter: Secondary | ICD-10-CM | POA: Insufficient documentation

## 2013-02-09 DIAGNOSIS — Z951 Presence of aortocoronary bypass graft: Secondary | ICD-10-CM | POA: Insufficient documentation

## 2013-02-09 DIAGNOSIS — Z87828 Personal history of other (healed) physical injury and trauma: Secondary | ICD-10-CM | POA: Insufficient documentation

## 2013-02-09 DIAGNOSIS — Z87820 Personal history of traumatic brain injury: Secondary | ICD-10-CM | POA: Insufficient documentation

## 2013-02-09 DIAGNOSIS — Y929 Unspecified place or not applicable: Secondary | ICD-10-CM | POA: Insufficient documentation

## 2013-02-09 DIAGNOSIS — Z87442 Personal history of urinary calculi: Secondary | ICD-10-CM | POA: Insufficient documentation

## 2013-02-09 DIAGNOSIS — Z8673 Personal history of transient ischemic attack (TIA), and cerebral infarction without residual deficits: Secondary | ICD-10-CM | POA: Insufficient documentation

## 2013-02-09 MED ORDER — ONDANSETRON HCL 4 MG/2ML IJ SOLN
4.0000 mg | Freq: Once | INTRAMUSCULAR | Status: AC
  Administered 2013-02-09: 4 mg via INTRAVENOUS
  Filled 2013-02-09: qty 2

## 2013-02-09 MED ORDER — HYDROMORPHONE HCL PF 1 MG/ML IJ SOLN
1.0000 mg | Freq: Once | INTRAMUSCULAR | Status: AC
  Administered 2013-02-09: 1 mg via INTRAVENOUS
  Filled 2013-02-09: qty 1
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
## 2013-02-13 ENCOUNTER — Ambulatory Visit (INDEPENDENT_AMBULATORY_CARE_PROVIDER_SITE_OTHER): Payer: Self-pay | Admitting: Physician Assistant

## 2013-02-13 ENCOUNTER — Ambulatory Visit
Admission: RE | Admit: 2013-02-13 | Discharge: 2013-02-13 | Disposition: A | Discharge status: Home / Self Care | Payer: Medicaid Other | Source: Ambulatory Visit | Attending: Cardiothoracic Surgery | Admitting: Cardiothoracic Surgery

## 2013-02-13 VITALS — BP 116/68 | HR 81 | Resp 18 | Ht 66.0 in | Wt 264.0 lb

## 2013-02-13 DIAGNOSIS — I6350 Cerebral infarction due to unspecified occlusion or stenosis of unspecified cerebral artery: Secondary | ICD-10-CM

## 2013-02-13 DIAGNOSIS — I639 Cerebral infarction, unspecified: Secondary | ICD-10-CM

## 2013-02-13 DIAGNOSIS — I2510 Atherosclerotic heart disease of native coronary artery without angina pectoris: Secondary | ICD-10-CM

## 2013-02-13 DIAGNOSIS — Z951 Presence of aortocoronary bypass graft: Secondary | ICD-10-CM

## 2013-02-13 MED ORDER — TRAMADOL HCL 50 MG PO TABS: 50.0000 mg | ORAL_TABLET | Freq: Four times a day (QID) | ORAL | Status: DC | PRN

## 2013-02-13 MED ORDER — INSULIN DETEMIR 100 UNIT/ML ~~LOC~~ SOLN: 20.0000 [IU] | Freq: Every day | SUBCUTANEOUS | Status: DC

## 2013-02-13 MED ORDER — ROPINIROLE HCL 0.25 MG PO TABS: 0.2500 mg | ORAL_TABLET | Freq: Two times a day (BID) | ORAL | Status: DC

## 2013-02-13 MED ORDER — FINASTERIDE 5 MG PO TABS: 5.0000 mg | ORAL_TABLET | Freq: Every day | ORAL | Status: DC

## 2013-02-13 MED ORDER — LORATADINE 10 MG PO TABS: 10.0000 mg | ORAL_TABLET | Freq: Every day | ORAL | Status: DC

## 2013-02-13 MED ORDER — ATORVASTATIN CALCIUM 20 MG PO TABS: 20.0000 mg | ORAL_TABLET | Freq: Every day | ORAL | Status: DC

## 2013-02-13 MED ORDER — INSULIN LISPRO 100 UNIT/ML (KWIKPEN): 5.0000 [IU] | PEN_INJECTOR | Freq: Three times a day (TID) | SUBCUTANEOUS | Status: DC

## 2013-02-13 MED ORDER — ESOMEPRAZOLE MAGNESIUM 20 MG PO PACK: 20.0000 mg | PACK | Freq: Every day | ORAL | Status: DC

## 2013-02-13 MED ORDER — LISINOPRIL 2.5 MG PO TABS: 2.5000 mg | ORAL_TABLET | Freq: Every day | ORAL | Status: DC

## 2013-02-13 MED ORDER — GABAPENTIN 100 MG PO CAPS: 100.0000 mg | ORAL_CAPSULE | Freq: Three times a day (TID) | ORAL | Status: DC

## 2013-02-13 MED ORDER — PAROXETINE HCL 20 MG PO TABS: 20.0000 mg | ORAL_TABLET | Freq: Every day | ORAL | Status: DC

## 2013-02-13 MED ORDER — METOPROLOL TARTRATE 25 MG PO TABS: 25.0000 mg | ORAL_TABLET | Freq: Two times a day (BID) | ORAL | Status: DC
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
## 2013-02-21 ENCOUNTER — Other Ambulatory Visit: Payer: Self-pay | Admitting: *Deleted

## 2013-02-21 DIAGNOSIS — G8918 Other acute postprocedural pain: Secondary | ICD-10-CM

## 2013-02-21 MED ORDER — HYDROCODONE-ACETAMINOPHEN 7.5-325 MG PO TABS: 1.0000 | ORAL_TABLET | Freq: Four times a day (QID) | ORAL | Status: DC | PRN

## 2013-02-22 ENCOUNTER — Encounter: Payer: Self-pay | Admitting: Cardiology

## 2013-02-22 ENCOUNTER — Ambulatory Visit (INDEPENDENT_AMBULATORY_CARE_PROVIDER_SITE_OTHER): Payer: Medicaid Other | Admitting: Cardiology

## 2013-02-22 VITALS — BP 126/70 | HR 64 | Wt 257.0 lb

## 2013-02-22 DIAGNOSIS — I255 Ischemic cardiomyopathy: Secondary | ICD-10-CM

## 2013-02-22 DIAGNOSIS — E785 Hyperlipidemia, unspecified: Secondary | ICD-10-CM

## 2013-02-22 DIAGNOSIS — I2510 Atherosclerotic heart disease of native coronary artery without angina pectoris: Secondary | ICD-10-CM | POA: Insufficient documentation

## 2013-02-22 DIAGNOSIS — I2589 Other forms of chronic ischemic heart disease: Secondary | ICD-10-CM

## 2013-02-22 DIAGNOSIS — E782 Mixed hyperlipidemia: Secondary | ICD-10-CM | POA: Insufficient documentation

## 2013-02-22 DIAGNOSIS — I10 Essential (primary) hypertension: Secondary | ICD-10-CM

## 2013-02-22 DIAGNOSIS — I4892 Unspecified atrial flutter: Secondary | ICD-10-CM

## 2013-02-22 MED ORDER — METOPROLOL SUCCINATE ER 50 MG PO TB24: 50.0000 mg | ORAL_TABLET | Freq: Every day | ORAL | Status: DC

## 2013-02-22 MED ORDER — AMIODARONE HCL 200 MG PO TABS: 200.0000 mg | ORAL_TABLET | Freq: Every day | ORAL | Status: DC

## 2013-02-22 NOTE — Assessment & Plan Note (Signed)
Continue aspirin and statin. 
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
## 2013-02-22 NOTE — Assessment & Plan Note (Signed)
Continue statin. Check lipids and liver when he comes for his echocardiogram in 3 months.

## 2013-02-22 NOTE — Patient Instructions (Signed)
Your physician recommends that you schedule a follow-up appointment in: AFTER TESTING (ECHO)  STOP LOPRESSOR  START METOPROLOL SUCC 50 MG ONCE DAILY  REFERRAL TO EP FOR ATRIAL FLUTTER ABLATION  Your physician has requested that you have an echocardiogram. Echocardiography is a painless test that uses sound waves to create images of your heart. It provides your doctor with information about the size and shape of your heart and how well your heart's chambers and valves are working. This procedure takes approximately one hour. There are no restrictions for this procedure.IN 3 MONTHS   DECREASE AMIODARONE TO 200 MG ONCE DAILY  Your physician recommends that you return for lab work in: FASTING WITH ECHO
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
## 2013-02-22 NOTE — Assessment & Plan Note (Signed)
Continue present medications. 

## 2013-02-22 NOTE — Assessment & Plan Note (Signed)
Continue ACE inhibitor. Change Lopressor to Toprol 50 mg daily. Repeat echocardiogram in 3 months to see if LV function improved following revascularization.

## 2013-02-23 ENCOUNTER — Other Ambulatory Visit: Payer: Self-pay

## 2013-02-23 DIAGNOSIS — I4892 Unspecified atrial flutter: Secondary | ICD-10-CM

## 2013-02-23 DIAGNOSIS — E785 Hyperlipidemia, unspecified: Secondary | ICD-10-CM

## 2013-02-23 DIAGNOSIS — I2510 Atherosclerotic heart disease of native coronary artery without angina pectoris: Secondary | ICD-10-CM

## 2013-02-23 DIAGNOSIS — I10 Essential (primary) hypertension: Secondary | ICD-10-CM

## 2013-02-23 DIAGNOSIS — I255 Ischemic cardiomyopathy: Secondary | ICD-10-CM

## 2013-02-23 MED ORDER — AMIODARONE HCL 200 MG PO TABS: 200.0000 mg | ORAL_TABLET | Freq: Every day | ORAL | Status: DC

## 2013-03-09 ENCOUNTER — Encounter: Payer: Self-pay | Admitting: Cardiology

## 2013-03-15 ENCOUNTER — Ambulatory Visit (INDEPENDENT_AMBULATORY_CARE_PROVIDER_SITE_OTHER): Payer: Medicaid Other | Admitting: Internal Medicine

## 2013-03-15 ENCOUNTER — Encounter: Payer: Self-pay | Admitting: Internal Medicine

## 2013-03-15 VITALS — BP 123/68 | HR 63 | Ht 66.0 in | Wt 264.0 lb

## 2013-03-15 DIAGNOSIS — I4892 Unspecified atrial flutter: Secondary | ICD-10-CM

## 2013-03-15 NOTE — Patient Instructions (Addendum)
Your physician recommends that you continue on your current medications as directed. Please refer to the Current Medication list given to you today.     
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
## 2013-03-15 NOTE — Assessment & Plan Note (Addendum)
Pt with an isolated episode of atrial flutter referred for ablation.  I am concernted that in the context of hsi prior CVA that it would be hard to jsutify not using anticoagulation and as such a symptomatic recurrence might be reasoanable prior to proceeding.  alternatively we could do RFCA and then consider the use of an Implantable loop recorder to clarfiy the presence of atrial fibrillation and the need for anticoagulation which i would probably favor anyway.  A loop recorder without ablation to clarify the absence of atrial fib and the presence of aflutter might be another approach i will discuss this with Dr Marsa Aris  i also would probably favo the use of apixoban instead of the asa, but would look to input from neuro, but again the presence of afib would inform this issue as well

## 2013-03-16 ENCOUNTER — Ambulatory Visit (INDEPENDENT_AMBULATORY_CARE_PROVIDER_SITE_OTHER): Payer: Self-pay | Admitting: Cardiothoracic Surgery

## 2013-03-16 ENCOUNTER — Encounter: Payer: Self-pay | Admitting: Cardiothoracic Surgery

## 2013-03-16 VITALS — BP 107/72 | HR 72 | Resp 20 | Ht 66.0 in | Wt 264.0 lb

## 2013-03-16 DIAGNOSIS — I2510 Atherosclerotic heart disease of native coronary artery without angina pectoris: Secondary | ICD-10-CM

## 2013-03-16 DIAGNOSIS — Z951 Presence of aortocoronary bypass graft: Secondary | ICD-10-CM

## 2013-03-16 MED ORDER — HYDROCODONE-ACETAMINOPHEN 5-325 MG PO TABS: 1.0000 | ORAL_TABLET | Freq: Four times a day (QID) | ORAL | Status: DC | PRN

## 2013-03-16 NOTE — Progress Notes (Signed)
[address]       [place] [address]             [phone]                  EMARION TORAL [facility] Medical Record #[id number] Date of Birth: 1951-09-18  Avon Gully, MD Avon Gully, MD  Chief Complaint:   PostOp Follow Up Visit  01/09/2013  PREOPERATIVE DIAGNOSIS: Coronary occlusive disease with left main  obstruction.  POSTOPERATIVE DIAGNOSIS: Coronary occlusive disease with left main  obstruction.  SURGICAL PROCEDURE: Coronary artery bypass grafting x5 with the left  internal mammary to the left anterior descending coronary artery,  sequential reverse saphenous vein graft to the diagonal coronary artery  and first obtuse marginal coronary artery, reverse saphenous vein graft  sequentially to the distal right coronary artery and distal circumflex  with right leg and thigh and calf endo vein harvesting.      History of Present Illness:      Doing well post op. Now living at home. Has been back to see Cardiology and considering ablation for history a flutter. Still smoking but less    History  Smoking status  . Current Every Day Smoker -- 1.00 packs/day for 47 years  . Types: Cigarettes  Smokeless tobacco  . Never Used       Allergies  Allergen Reactions  . Other Anaphylaxis    Muscle relaxers; back on Cyclobenzaprine since 12/21/12.  Marland Kitchen Flomax (Tamsulosin Hcl) Swelling    Swelling around the mouth with rash on face  . Morphine And Related Itching    Current Outpatient Prescriptions  Medication Sig Dispense Refill  . amiodarone (PACERONE) 200 MG tablet Take 1 tablet (200 mg total) by mouth daily.  60 tablet  1  . aspirin EC 325 MG EC tablet Take 1 tablet (325 mg total) by mouth daily.  30 tablet    . atorvastatin (LIPITOR) 20 MG tablet Take 1 tablet (20 mg total) by mouth at bedtime.  30 tablet  0  . diclofenac sodium (VOLTAREN) 1 % GEL Apply 1 application topically 2 (two) times daily. Apply to left shoulder      .  Dicyclomine HCl (BENTYL PO) Take 4 tablets by mouth daily.      Marland Kitchen esomeprazole (NEXIUM) 20 MG packet Take 20 mg by mouth daily.  30 each  0  . finasteride (PROSCAR) 5 MG tablet Take 1 tablet (5 mg total) by mouth daily.  30 tablet  0  . gabapentin (NEURONTIN) 100 MG capsule Take 1 capsule (100 mg total) by mouth 3 (three) times daily.  90 capsule  0  . GLUCOSAMINE PO Take 1 capsule by mouth daily.      Marland Kitchen HYDROcodone-acetaminophen (NORCO/VICODIN) 5-325 MG per tablet Take 1 tablet by mouth every 6 (six) hours as needed for pain.      Marland Kitchen insulin aspart (NOVOLOG) 100 UNIT/ML injection Inject 5 Units into the skin 3 (three) times daily before meals.      . insulin detemir (LEVEMIR) 100 UNIT/ML injection Inject 0.2 mLs (20 Units total) into the skin at bedtime.  10 mL  0  . insulin lispro (HUMALOG KWIKPEN) 100 unit/mL SOLN Inject 5 Units into the skin 3 (three) times daily with meals.  3 mL  0  . iron polysaccharides (NIFEREX) 150 MG capsule Take 150 mg by mouth 2 (two) times daily.      . Linagliptin (TRADJENTA PO) Take 1  tablet by mouth daily.      Marland Kitchen lisinopril (PRINIVIL,ZESTRIL) 2.5 MG tablet Take 1 tablet (2.5 mg total) by mouth daily.  30 tablet  0  . loratadine (CLARITIN) 10 MG tablet Take 1 tablet (10 mg total) by mouth daily.  30 tablet  0  . metoprolol succinate (TOPROL-XL) 50 MG 24 hr tablet Take 1 tablet (50 mg total) by mouth daily. Take with or immediately following a meal.  30 tablet  12  . PARoxetine (PAXIL) 20 MG tablet Take 1 tablet (20 mg total) by mouth daily.  30 tablet  0  . rOPINIRole (REQUIP) 0.25 MG tablet Take 1 tablet (0.25 mg total) by mouth 2 (two) times daily.  60 tablet  0   No current facility-administered medications for this visit.       Physical Exam: BP 107/72  Pulse 72  Resp 20  Ht 5\' 6"  (1.676 m)  Wt 264 lb (119.75 kg)  BMI 42.63 kg/m2  SpO2 96%  General appearance: alert and appears older than stated age Neurologic: intact Heart: regular rate and  rhythm, S1, S2 normal, no murmur, click, rub or gallop and normal apical impulse Lungs: clear to auscultation bilaterally and normal percussion bilaterally Abdomen: soft, non-tender; bowel sounds normal; no masses,  no organomegaly Extremities: extremities normal, atraumatic, no cyanosis or edema and rt leg incision healing well Wound: sternum stable healing well  Diagnostic Studies & Laboratory data:         Recent Radiology Findings:  none  Recent Labs: Lab Results  Component Value Date   WBC 6.3 01/18/2013   HGB 9.9* 01/18/2013   HCT 29.3* 01/18/2013   PLT 259 01/18/2013   GLUCOSE 98 01/18/2013   CHOL 188 01/02/2013   TRIG 95 01/02/2013   HDL 49 01/02/2013   LDLCALC 120* 01/02/2013   ALT 25 01/08/2013   AST 19 01/08/2013   NA 139 01/18/2013   K 4.0 01/18/2013   CL 103 01/18/2013   CREATININE 0.78 01/18/2013   BUN 9 01/18/2013   CO2 28 01/18/2013   TSH 1.657 01/09/2013   INR 1.59* 01/09/2013   HGBA1C 6.7* 01/02/2013      Assessment / Plan:   Stable post op CABG, limited by pre op history of stoke. Not on coumadin because of risks of falling , had been on Plavix in the past due to stroke, not on now Will leave anticoagulation therapy to EP and Cardiology Will see back PRN        Reuel Lamadrid B 03/16/2013 1:33 PM

## 2013-03-20 ENCOUNTER — Other Ambulatory Visit: Payer: Self-pay | Admitting: *Deleted

## 2013-03-20 MED ORDER — ATORVASTATIN CALCIUM 20 MG PO TABS: 20.0000 mg | ORAL_TABLET | Freq: Every day | ORAL | Status: DC

## 2013-05-10 ENCOUNTER — Telehealth: Payer: Self-pay | Admitting: Internal Medicine

## 2013-05-10 DIAGNOSIS — E7800 Pure hypercholesterolemia, unspecified: Secondary | ICD-10-CM

## 2013-05-10 NOTE — Telephone Encounter (Signed)
New Prob  Pt wife wants to know if the lab test we want done can be faxed to [facility] at this number [phone]. She said pt has to go there on the 8th for another provider.

## 2013-05-10 NOTE — Telephone Encounter (Signed)
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

## 2013-05-19 LAB — HEPATIC FUNCTION PANEL
ALT: 14 U/L (ref 0–53)
AST: 15 U/L (ref 0–37)
Albumin: 3.8 g/dL (ref 3.5–5.2)
Alkaline Phosphatase: 72 U/L (ref 39–117)
Total Bilirubin: 0.4 mg/dL (ref 0.3–1.2)

## 2013-05-25 ENCOUNTER — Encounter: Payer: Self-pay | Admitting: Internal Medicine

## 2013-05-26 ENCOUNTER — Other Ambulatory Visit: Payer: Medicaid Other

## 2013-05-26 ENCOUNTER — Other Ambulatory Visit (HOSPITAL_COMMUNITY): Payer: Medicaid Other

## 2013-05-26 ENCOUNTER — Ambulatory Visit (HOSPITAL_COMMUNITY): Payer: Medicaid Other | Attending: Cardiology | Admitting: Radiology

## 2013-05-26 DIAGNOSIS — I10 Essential (primary) hypertension: Secondary | ICD-10-CM

## 2013-05-26 DIAGNOSIS — I428 Other cardiomyopathies: Secondary | ICD-10-CM | POA: Insufficient documentation

## 2013-05-26 DIAGNOSIS — I4892 Unspecified atrial flutter: Secondary | ICD-10-CM

## 2013-05-26 DIAGNOSIS — I255 Ischemic cardiomyopathy: Secondary | ICD-10-CM

## 2013-05-26 DIAGNOSIS — E785 Hyperlipidemia, unspecified: Secondary | ICD-10-CM

## 2013-05-26 DIAGNOSIS — I2510 Atherosclerotic heart disease of native coronary artery without angina pectoris: Secondary | ICD-10-CM | POA: Insufficient documentation

## 2013-05-26 DIAGNOSIS — E119 Type 2 diabetes mellitus without complications: Secondary | ICD-10-CM | POA: Insufficient documentation

## 2013-05-26 DIAGNOSIS — I4891 Unspecified atrial fibrillation: Secondary | ICD-10-CM | POA: Insufficient documentation

## 2013-05-26 NOTE — Progress Notes (Addendum)
Echocardiogram performed. Echo report faxed 

## 2013-06-21 ENCOUNTER — Encounter: Payer: Self-pay | Admitting: Family Medicine

## 2013-06-21 ENCOUNTER — Telehealth: Payer: Self-pay | Admitting: Family Medicine

## 2013-06-21 ENCOUNTER — Ambulatory Visit (INDEPENDENT_AMBULATORY_CARE_PROVIDER_SITE_OTHER): Payer: Medicaid Other | Admitting: Family Medicine

## 2013-06-21 VITALS — BP 102/70 | Ht 67.0 in | Wt 293.6 lb

## 2013-06-21 DIAGNOSIS — N400 Enlarged prostate without lower urinary tract symptoms: Secondary | ICD-10-CM

## 2013-06-21 DIAGNOSIS — I10 Essential (primary) hypertension: Secondary | ICD-10-CM | POA: Insufficient documentation

## 2013-06-21 DIAGNOSIS — E785 Hyperlipidemia, unspecified: Secondary | ICD-10-CM | POA: Insufficient documentation

## 2013-06-21 DIAGNOSIS — I25810 Atherosclerosis of coronary artery bypass graft(s) without angina pectoris: Secondary | ICD-10-CM

## 2013-06-21 DIAGNOSIS — R3915 Urgency of urination: Secondary | ICD-10-CM

## 2013-06-21 DIAGNOSIS — I2510 Atherosclerotic heart disease of native coronary artery without angina pectoris: Secondary | ICD-10-CM | POA: Insufficient documentation

## 2013-06-21 DIAGNOSIS — K219 Gastro-esophageal reflux disease without esophagitis: Secondary | ICD-10-CM

## 2013-06-21 NOTE — Telephone Encounter (Signed)
Pt ref'd for Pain Management:  He was discharged from Dr. Gerilyn Pilgrim in 2011 for loud & disruptive behavior and abnormal drug screen. Was dismissed from Dr. Riley Kill in [place] in 2013 and dismissed from Dr. Felecia Shelling in 2014 in [place]. I have not been able to find anyone that will take him due to this track record and the abnormal drug screen.  Please advise.

## 2013-06-21 NOTE — Progress Notes (Signed)
Subjective:    Patient ID: [name], male    DOB: [birth year], 62 y.o.   MRN: [id number]  HPI Comments: [name] is a 62 y.o WM here to establish care. He was seeing Dr. Felecia Shelling for 3 months but doesn't care to continue going there. He is here as a new patient with multiple medical problems.  He has had a MI and bypass surgery. He's had a stroke and has DM.  Last lab work in August was all good and noted. Last colonoscopy was 2 years ago. He had one polyp and asked to come back in 5 years. The patient sees Dr. Fransico Him for Endocrinology for his Diabetes. He use to see Dr. Felecia Shelling in [facility] but no longer goes to that clinic.  He has a hx of OA of bilateral hips and has chronic pain. He's mainly here for referral to pain management. He says Dr. Felecia Shelling attempted to refer him to pain clinic but he says this has been 9 months ago.   PMH: CAD, CVA, HLD, CHF, HTN, A. Flutter that's resolved and didn't need ablation. On Amiodarone. Medications: See list Surgeries: CABG x 5, right ankle after a fall Allergies: flomax, morphine Social: No Etoh, quit during 1999, does smoke 10-15 cigarettes a day, No illicit drug use. Lives with [place] with wife. Has a 49 y.o son.  Family: 2 brothers and one sister. One brother 61 y.o who has CAD and has had stent and CABG. One brother died of DM and alcohol use.      Review of Systems  Constitutional: Negative for activity change, appetite change and unexpected weight change.  HENT: Negative for ear pain, congestion, sore throat and sinus pressure.   Eyes: Negative for visual disturbance.  Respiratory: Negative for cough, chest tightness, shortness of breath and wheezing.   Cardiovascular: Negative for chest pain and palpitations.  Gastrointestinal: Negative for nausea, vomiting, abdominal pain, diarrhea and constipation.  Endocrine: Negative for cold intolerance, heat intolerance, polydipsia and polyuria.  Genitourinary: Negative for dysuria.  Musculoskeletal:  Positive for back pain and gait problem.       Chronic back and hip pain  Neurological: Negative for dizziness, numbness and headaches.  Psychiatric/Behavioral: Negative for behavioral problems, sleep disturbance, dysphoric mood and decreased concentration.       Objective:   Physical Exam  Nursing note and vitals reviewed. Constitutional: He is oriented to person, place, and time.  Obesed WM in NAD  HENT:  Head: Normocephalic and atraumatic.  Right Ear: External ear normal.  Left Ear: External ear normal.  Nose: Nose normal.  Mouth/Throat: Oropharynx is clear and moist.  Eyes: Pupils are equal, round, and reactive to light.  Neck: Normal range of motion.  Cardiovascular: Normal rate and normal heart sounds.   Pulmonary/Chest: Effort normal and breath sounds normal.  Musculoskeletal:  Left lower extremity in brace  Left upper extremity in sling  Neurological: He is alert and oriented to person, place, and time.  Skin: Skin is warm and dry.  Psychiatric: He has a normal mood and affect. His behavior is normal. Thought content normal.       Assessment & Plan:  [name] was seen today for new patient.  Diagnoses and associated orders for this visit:  CAD (coronary artery disease) of artery bypass graft  HLD (hyperlipidemia)  HTN (hypertension)  GERD (gastroesophageal reflux disease)  BPH (benign prostatic hyperplasia)  Urinary urgency  Osteoarthritis of Bilateral hips.  To continue current regimen. -After attempt to refer  to pain management, the patient has been declined by multiple providers in [place] and [facility] due to disruptive behavior and an abnormal drug screen.  -I have just received his medical records with the appt and will review to get further information.  -He understands that no narcotic medications will be prescribed by our office.  -Will manage other medical conditions such as BPH, HLD, HTN. He's up to date with chronic maintenance lab  work and these have been reviewed. Last labs were done from previous provider in Aug 2014.  -Will follow up in 3 months. He has gotten Pneumovax and next one due after the age of 79. He will return the end of the month for flu vaccine. He's had colonoscopy. PSA not indicated per guidelines according to the USPSTF.

## 2013-06-29 ENCOUNTER — Telehealth: Payer: Self-pay

## 2013-06-29 NOTE — Telephone Encounter (Signed)
Patient's wife called to verify her husband med list and to get pain med. I looked at the surgeon last office note and went over his list with.

## 2013-07-24 ENCOUNTER — Ambulatory Visit: Payer: Medicaid Other | Admitting: Family Medicine

## 2013-07-28 ENCOUNTER — Ambulatory Visit (INDEPENDENT_AMBULATORY_CARE_PROVIDER_SITE_OTHER): Payer: Medicaid Other | Admitting: Family Medicine

## 2013-07-28 ENCOUNTER — Encounter: Payer: Self-pay | Admitting: Family Medicine

## 2013-07-28 VITALS — BP 110/78 | Temp 97.6°F | Wt 287.5 lb

## 2013-07-28 DIAGNOSIS — G2581 Restless legs syndrome: Secondary | ICD-10-CM

## 2013-07-28 DIAGNOSIS — J309 Allergic rhinitis, unspecified: Secondary | ICD-10-CM | POA: Insufficient documentation

## 2013-07-28 DIAGNOSIS — E1143 Type 2 diabetes mellitus with diabetic autonomic (poly)neuropathy: Secondary | ICD-10-CM

## 2013-07-28 DIAGNOSIS — J029 Acute pharyngitis, unspecified: Secondary | ICD-10-CM | POA: Insufficient documentation

## 2013-07-28 DIAGNOSIS — G909 Disorder of the autonomic nervous system, unspecified: Secondary | ICD-10-CM

## 2013-07-28 DIAGNOSIS — E1149 Type 2 diabetes mellitus with other diabetic neurological complication: Secondary | ICD-10-CM

## 2013-07-28 DIAGNOSIS — I8310 Varicose veins of unspecified lower extremity with inflammation: Secondary | ICD-10-CM

## 2013-07-28 DIAGNOSIS — N400 Enlarged prostate without lower urinary tract symptoms: Secondary | ICD-10-CM

## 2013-07-28 DIAGNOSIS — I8311 Varicose veins of right lower extremity with inflammation: Secondary | ICD-10-CM

## 2013-07-28 NOTE — Progress Notes (Signed)
Subjective:    Patient ID: [name], male    DOB: 1951/05/01, 62 y.o.   MRN: [id number]  HPI Comments: [name] is a 62 y.o WM here with complaints of sore throat. He says he thinks it's because of his allergies. He has been on claritin which hasn't helped any with his sore throat.   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 He use to get botox injections but stopped doing this since he felt like it didn't help with his pain. He also has CHF, A. Flutter, HTN, DM, BPH.   He request pain medication today. He use to have a PCP who he states sent a referral to Pain clinic of which he is yet to hear back from. I also sent a referral in during his initial visit with  Me but he was denied due to past behavior with staff.   He also had a question about skin color changes to his RLE.  It's been there for years. Doesn't cause him any pain or pruritis. He just wanted to know what it came from. He does report occasional swelling.  He is on requip for RLS and says his legs hurt at night. He has to get up and move around, which tends to improve his pain and feeling like something is crawling on him. He says his requip now doesn't really help him. He takes it BID. This was prescribed to him by previous physician.  He request refills on his requip, neurontin, proscar, lipitor, lisinopril, and claritin.    Sore Throat  This is a chronic problem. The current episode started more than 1 year ago (2 WEEKS). The problem has been waxing and waning. There has been no fever. The patient is experiencing no pain. Pertinent negatives include no abdominal pain, coughing, diarrhea, drooling, ear discharge, ear pain, headaches, hoarse voice, plugged ear sensation, shortness of breath, stridor, swollen glands, trouble swallowing or vomiting. He has tried NSAIDs for the symptoms. The treatment provided mild relief.       Review of Systems  Constitutional: Negative for activity change, appetite change, fatigue and unexpected weight change.  HENT: Positive for sore throat. Negative for drooling, ear discharge, ear pain, hoarse voice, postnasal drip, rhinorrhea, sinus pressure and trouble swallowing.   Respiratory: Negative for cough, chest tightness, shortness of breath, wheezing and stridor.   Gastrointestinal: Negative for nausea, vomiting, abdominal pain, diarrhea and constipation.  Genitourinary: Negative for dysuria, frequency, hematuria and difficulty urinating.  Musculoskeletal: Negative for back pain, joint swelling and neck stiffness.  Skin: Negative for color change and wound.  Neurological: Positive for weakness. Negative for dizziness, numbness and headaches.       Chronic weakness from CVA   Psychiatric/Behavioral: Negative for behavioral problems, sleep disturbance and decreased concentration. The patient is not nervous/anxious.        Objective:   Physical Exam  Vitals reviewed. Constitutional: He is oriented to person, place, and time. He appears well-developed and well-nourished.  HENT:  Head: Normocephalic and atraumatic.  Right Ear: External ear normal.  Left Ear: External ear normal.  Post nasal drainage  Boggy nasal turbinates R>L  Eyes: Conjunctivae are normal. Pupils are equal, round, and reactive to light.  Cardiovascular: Normal rate, regular rhythm and normal heart sounds.   Pulmonary/Chest: Effort normal and breath sounds normal. No respiratory distress. He has no wheezes. He exhibits no tenderness.  Abdominal: Soft. Bowel sounds are normal. He exhibits no distension. There is no tenderness. There is no guarding.  Neurological: He is alert and oriented to person, place, and time.  Skin: Skin is warm and dry.  Psychiatric: He has a normal mood and affect. His behavior is normal. Thought content normal.       Assessment & Plan:  [name] was seen today for sore  throat.  Diagnoses and associated orders for this visit:  Sore throat  Allergic rhinitis  Stasis dermatitis, right  BPH (benign prostatic hyperplasia)  Peripheral autonomic neuropathy due to diabetes mellitus - gabapentin (NEURONTIN) 100 MG capsule; Take 1 capsule (100 mg total) by mouth 3 (three) times daily.  RLS (restless legs syndrome)  Sore throat likely due to allergic rhinitis. Will send in zyrtec instead and see if this works. He didn't want the nose sprays.   Will refill requip, neurontin, and proscar.  Return in 1 month for follow up of DM, HTN, and routine labs.

## 2013-07-31 ENCOUNTER — Telehealth: Payer: Self-pay | Admitting: *Deleted

## 2013-07-31 DIAGNOSIS — G2581 Restless legs syndrome: Secondary | ICD-10-CM | POA: Insufficient documentation

## 2013-07-31 DIAGNOSIS — I872 Venous insufficiency (chronic) (peripheral): Secondary | ICD-10-CM | POA: Insufficient documentation

## 2013-07-31 DIAGNOSIS — E1143 Type 2 diabetes mellitus with diabetic autonomic (poly)neuropathy: Secondary | ICD-10-CM | POA: Insufficient documentation

## 2013-07-31 MED ORDER — ROPINIROLE HCL 0.5 MG PO TABS: 0.5000 mg | ORAL_TABLET | Freq: Every evening | ORAL | Status: DC | PRN

## 2013-07-31 MED ORDER — GABAPENTIN 100 MG PO CAPS: 100.0000 mg | ORAL_CAPSULE | Freq: Three times a day (TID) | ORAL | Status: DC

## 2013-07-31 MED ORDER — CETIRIZINE HCL 10 MG PO TABS
10.0000 mg | ORAL_TABLET | Freq: Every day | ORAL | Status: DC
Start: 2013-07-31 — End: 2013-08-21

## 2013-07-31 MED ORDER — FINASTERIDE 5 MG PO TABS: 5.0000 mg | ORAL_TABLET | Freq: Every day | ORAL | Status: DC

## 2013-07-31 NOTE — Telephone Encounter (Signed)
Refill on his lipitor, lisinopril,   [facility]

## 2013-07-31 NOTE — Telephone Encounter (Signed)
She stated that he hasn't seen cardiology in a while and that you had agreed to write them??

## 2013-07-31 NOTE — Telephone Encounter (Signed)
He is getting this from Cardiology.

## 2013-07-31 NOTE — Patient Instructions (Signed)
Allergic Rhinitis Allergic rhinitis is when the mucous membranes in the nose respond to allergens. Allergens are particles in the air that cause your body to have an allergic reaction. This causes you to release allergic antibodies. Through a chain of events, these eventually cause you to release histamine into the blood stream (hence the use of antihistamines). Although meant to be protective to the body, it is this release that causes your discomfort, such as frequent sneezing, congestion and an itchy runny nose.  CAUSES  The pollen allergens may come from grasses, trees, and weeds. This is seasonal allergic rhinitis, or "hay fever." Other allergens cause year-round allergic rhinitis (perennial allergic rhinitis) such as house dust mite allergen, pet dander and mold spores.  SYMPTOMS   Nasal stuffiness (congestion).  Runny, itchy nose with sneezing and tearing of the eyes.  There is often an itching of the mouth, eyes and ears. It cannot be cured, but it can be controlled with medications. DIAGNOSIS  If you are unable to determine the offending allergen, skin or blood testing may find it. TREATMENT   Avoid the allergen.  Medications and allergy shots (immunotherapy) can help.  Hay fever may often be treated with antihistamines in pill or nasal spray forms. Antihistamines block the effects of histamine. There are over-the-counter medicines that may help with nasal congestion and swelling around the eyes. Check with your caregiver before taking or giving this medicine. If the treatment above does not work, there are many new medications your caregiver can prescribe. Stronger medications may be used if initial measures are ineffective. Desensitizing injections can be used if medications and avoidance fails. Desensitization is when a patient is given ongoing shots until the body becomes less sensitive to the allergen. Make sure you follow up with your caregiver if problems continue. SEEK MEDICAL  CARE IF:   You develop fever (more than 100.5 F (38.1 C).  You develop a cough that does not stop easily (persistent).  You have shortness of breath.  You start wheezing.  Symptoms interfere with normal daily activities. Document Released: 06/23/2001 Document Revised: 12/21/2011 Document Reviewed: 01/02/2009 Exit[employer] Patient Information 2014 [employer].  

## 2013-08-02 ENCOUNTER — Telehealth: Payer: Self-pay | Admitting: *Deleted

## 2013-08-02 ENCOUNTER — Other Ambulatory Visit: Payer: Self-pay | Admitting: Family Medicine

## 2013-08-02 DIAGNOSIS — I639 Cerebral infarction, unspecified: Secondary | ICD-10-CM

## 2013-08-02 DIAGNOSIS — E119 Type 2 diabetes mellitus without complications: Secondary | ICD-10-CM

## 2013-08-02 DIAGNOSIS — E785 Hyperlipidemia, unspecified: Secondary | ICD-10-CM

## 2013-08-02 DIAGNOSIS — I2510 Atherosclerotic heart disease of native coronary artery without angina pectoris: Secondary | ICD-10-CM

## 2013-08-02 MED ORDER — ATORVASTATIN CALCIUM 20 MG PO TABS: 20.0000 mg | ORAL_TABLET | Freq: Every day | ORAL | Status: DC

## 2013-08-02 MED ORDER — LISINOPRIL 2.5 MG PO TABS: 2.5000 mg | ORAL_TABLET | Freq: Every day | ORAL | Status: DC

## 2013-08-02 NOTE — Telephone Encounter (Signed)
Will send in 2 months worth of medications and patient to keep scheduled appt in Dec for Diabetes follow up and lab work. Thanks.

## 2013-08-02 NOTE — Telephone Encounter (Signed)
Patients wife called to make [place] aware that her husband now has a pcp but was requesting that we refill  Lisinopril and Lipitor as Dr Jens Som originally prescribed them. She stated that his Lipitor is 10mg  but we last filled 20mg  in June with no refills, so I am unsure who has been filling this for him. Please advise. Thanks, MI

## 2013-08-02 NOTE — Progress Notes (Signed)
Wife called office and stated that the patient no longer sees Cardiology?  He does have appt for vascular follow up study of carotid stenosis and hx of CVA.  This appt is in January. Will refill Lipitor and Lisinopril and patient has agreed to keep appt in December for follow up of Diabetes, HTN, etc and routine lab work to check renal function, liver function, A1c for diabetes, with fasting lipids.

## 2013-08-03 NOTE — Telephone Encounter (Signed)
Wife informed

## 2013-08-03 NOTE — Telephone Encounter (Signed)
Medications have been refilled by PCP.

## 2013-08-21 ENCOUNTER — Other Ambulatory Visit: Payer: Self-pay | Admitting: Family Medicine

## 2013-08-23 ENCOUNTER — Telehealth: Payer: Self-pay | Admitting: *Deleted

## 2013-08-23 NOTE — Telephone Encounter (Signed)
Pt called and asked about refills on medication. Stated that [facility] requested refills on Zyrtec, Requip and Neurontin. Informed pt that refills were approved on 08/21/2013 and sent to [facility]. Pt appreciative.

## 2013-08-31 ENCOUNTER — Other Ambulatory Visit: Payer: Self-pay | Admitting: *Deleted

## 2013-08-31 DIAGNOSIS — N400 Enlarged prostate without lower urinary tract symptoms: Secondary | ICD-10-CM

## 2013-08-31 MED ORDER — FINASTERIDE 5 MG PO TABS: 5.0000 mg | ORAL_TABLET | Freq: Every day | ORAL | Status: DC

## 2013-09-12 ENCOUNTER — Telehealth: Payer: Self-pay | Admitting: *Deleted

## 2013-09-12 NOTE — Telephone Encounter (Signed)
Woman called and left VM stating that pt needed a refill on his pain medication that Dr. Felecia Shelling handled for him and she wanted to know if you would supply this refill. She stated that it was supposed to be handled by another MD but pt was d/c from office after he refused to take Botox injections. Will route to MD.

## 2013-09-12 NOTE — Telephone Encounter (Signed)
Called [name] back regarding [name]'s pain medicines. She called Dr. Riley Kill office today to plead with them to get him into the pain clinic. A pain clinic referral was sent to me today and this was sent back in regards to his pain clinic referral needs. I explained to wife regarding the pain medicine policy here and that we will not refill this.   She then asked about refills on Toviaz. I explained to her that this wasn't on his medication list. I told her that we will call [facility] and verify this medicine before refilling. She voiced understanding and was appreciative.

## 2013-09-13 ENCOUNTER — Telehealth: Payer: Self-pay | Admitting: *Deleted

## 2013-09-13 NOTE — Telephone Encounter (Signed)
Burna Mortimer called and left VM asking about pt Toviaz. Nurse called pharmacy to check to see if that medication has been given previously. Dr. Felecia Shelling gave an order for Toviaz 4mg  QD in August with three refills. Will route to MD for refill approval.

## 2013-09-14 ENCOUNTER — Other Ambulatory Visit: Payer: Self-pay | Admitting: Family Medicine

## 2013-09-18 ENCOUNTER — Other Ambulatory Visit: Payer: Self-pay | Admitting: Family Medicine

## 2013-09-20 ENCOUNTER — Ambulatory Visit: Payer: Medicaid Other | Admitting: Family Medicine

## 2013-09-26 ENCOUNTER — Encounter: Payer: Self-pay | Admitting: Family Medicine

## 2013-09-26 ENCOUNTER — Ambulatory Visit (INDEPENDENT_AMBULATORY_CARE_PROVIDER_SITE_OTHER): Payer: Medicaid Other | Admitting: Family Medicine

## 2013-09-26 ENCOUNTER — Other Ambulatory Visit: Payer: Self-pay | Admitting: Family Medicine

## 2013-09-26 VITALS — BP 132/80 | HR 82 | Temp 98.4°F | Resp 24 | Wt 285.0 lb

## 2013-09-26 DIAGNOSIS — I4892 Unspecified atrial flutter: Secondary | ICD-10-CM

## 2013-09-26 DIAGNOSIS — N400 Enlarged prostate without lower urinary tract symptoms: Secondary | ICD-10-CM

## 2013-09-26 DIAGNOSIS — G4700 Insomnia, unspecified: Secondary | ICD-10-CM

## 2013-09-26 DIAGNOSIS — IMO0001 Reserved for inherently not codable concepts without codable children: Secondary | ICD-10-CM

## 2013-09-26 DIAGNOSIS — D509 Iron deficiency anemia, unspecified: Secondary | ICD-10-CM | POA: Insufficient documentation

## 2013-09-26 DIAGNOSIS — K5900 Constipation, unspecified: Secondary | ICD-10-CM

## 2013-09-26 DIAGNOSIS — E785 Hyperlipidemia, unspecified: Secondary | ICD-10-CM

## 2013-09-26 DIAGNOSIS — Z5181 Encounter for therapeutic drug level monitoring: Secondary | ICD-10-CM

## 2013-09-26 DIAGNOSIS — I25810 Atherosclerosis of coronary artery bypass graft(s) without angina pectoris: Secondary | ICD-10-CM

## 2013-09-26 DIAGNOSIS — E119 Type 2 diabetes mellitus without complications: Secondary | ICD-10-CM

## 2013-09-26 MED ORDER — MIRTAZAPINE 15 MG PO TABS: 15.0000 mg | ORAL_TABLET | Freq: Every day | ORAL | Status: DC
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
## 2013-09-27 ENCOUNTER — Telehealth: Payer: Self-pay | Admitting: Family Medicine

## 2013-09-27 ENCOUNTER — Encounter: Payer: Self-pay | Admitting: Family Medicine

## 2013-09-27 LAB — CBC WITH DIFFERENTIAL/PLATELET
HCT: 43.7 % (ref 39.0–52.0)
Hemoglobin: 14.7 g/dL (ref 13.0–17.0)
Lymphocytes Relative: 37 % (ref 12–46)
MCHC: 33.6 g/dL (ref 30.0–36.0)
MCV: 93.4 fL (ref 78.0–100.0)
Monocytes Absolute: 1.1 10*3/uL — ABNORMAL HIGH (ref 0.1–1.0)
Monocytes Relative: 9 % (ref 3–12)
Neutro Abs: 6 10*3/uL (ref 1.7–7.7)
Neutrophils Relative %: 49 % (ref 43–77)
RBC: 4.68 MIL/uL (ref 4.22–5.81)
WBC: 12 10*3/uL — ABNORMAL HIGH (ref 4.0–10.5)

## 2013-09-27 LAB — COMPREHENSIVE METABOLIC PANEL
ALT: 13 U/L (ref 0–53)
AST: 16 U/L (ref 0–37)
Albumin: 4 g/dL (ref 3.5–5.2)
BUN: 17 mg/dL (ref 6–23)
Calcium: 8.9 mg/dL (ref 8.4–10.5)
Chloride: 100 mEq/L (ref 96–112)
Glucose, Bld: 113 mg/dL — ABNORMAL HIGH (ref 70–99)
Potassium: 4.5 mEq/L (ref 3.5–5.3)
Sodium: 136 mEq/L (ref 135–145)
Total Bilirubin: 0.4 mg/dL (ref 0.3–1.2)
Total Protein: 7.3 g/dL (ref 6.0–8.3)

## 2013-09-27 LAB — PSA: PSA: 0.29 ng/mL (ref <=4.00)

## 2013-09-27 LAB — CK: Total CK: 42 U/L (ref 7–232)

## 2013-09-27 LAB — LIPID PANEL
Cholesterol: 167 mg/dL (ref 0–200)
Total CHOL/HDL Ratio: 4.9 Ratio
Triglycerides: 182 mg/dL — ABNORMAL HIGH (ref <150)
VLDL: 36 mg/dL (ref 0–40)

## 2013-09-27 NOTE — Telephone Encounter (Signed)
Have attempted to contact patient and Jay Haskew regarding labs drawn. No answer but left message. Will also send letter in mail explaining results. WBC up a little bit which may be secondary to sinus drainage. No fever reported but will follow up on this as well. If patient or Burna Mortimer calls back, i would like to speak to them.

## 2013-09-28 ENCOUNTER — Telehealth: Payer: Self-pay | Admitting: Family Medicine

## 2013-09-28 NOTE — Telephone Encounter (Signed)
What medication is he needing? I just sent refills on 12/4 and some of his medicines were discontinued. I have sent the cream in but looks like he should be good with everything else.

## 2013-09-28 NOTE — Telephone Encounter (Signed)
Wife called asking why refills were denied, is really needing the gel for his shoulder pain.

## 2013-09-28 NOTE — Telephone Encounter (Signed)
I refused all refills because I was waiting for MD to go over medication list. Will route to MD for what meds I can refill

## 2013-10-10 ENCOUNTER — Encounter: Payer: Self-pay | Admitting: Family Medicine

## 2013-10-10 ENCOUNTER — Ambulatory Visit (INDEPENDENT_AMBULATORY_CARE_PROVIDER_SITE_OTHER): Payer: Medicaid Other | Admitting: Family Medicine

## 2013-10-10 VITALS — BP 132/88 | HR 73 | Temp 98.6°F | Resp 24

## 2013-10-10 DIAGNOSIS — S92909A Unspecified fracture of unspecified foot, initial encounter for closed fracture: Secondary | ICD-10-CM | POA: Insufficient documentation

## 2013-10-10 DIAGNOSIS — R5381 Other malaise: Secondary | ICD-10-CM

## 2013-10-10 DIAGNOSIS — M62838 Other muscle spasm: Secondary | ICD-10-CM

## 2013-10-10 DIAGNOSIS — S92902A Unspecified fracture of left foot, initial encounter for closed fracture: Secondary | ICD-10-CM

## 2013-10-10 DIAGNOSIS — Z8673 Personal history of transient ischemic attack (TIA), and cerebral infarction without residual deficits: Secondary | ICD-10-CM

## 2013-10-10 DIAGNOSIS — M62422 Contracture of muscle, left upper arm: Secondary | ICD-10-CM

## 2013-10-10 DIAGNOSIS — K5900 Constipation, unspecified: Secondary | ICD-10-CM

## 2013-10-10 DIAGNOSIS — M159 Polyosteoarthritis, unspecified: Secondary | ICD-10-CM

## 2013-10-10 MED ORDER — DICLOFENAC SODIUM 1 % TD GEL: TRANSDERMAL | Status: DC
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
## 2013-10-13 ENCOUNTER — Other Ambulatory Visit: Payer: Self-pay | Admitting: Family Medicine

## 2013-10-16 ENCOUNTER — Other Ambulatory Visit: Payer: Self-pay | Admitting: Family Medicine

## 2013-10-16 NOTE — Telephone Encounter (Signed)
The Kenalog he shouldn't need. Was for an acute issue. The Tradjenta should be written by his endocrinologist. May refill the rest

## 2013-10-16 NOTE — Telephone Encounter (Signed)
Dr. Lorra Hals, can you please look over these refills? I know you were going through his medications and did not want to refill anything that you did not want refilled.

## 2013-10-20 ENCOUNTER — Other Ambulatory Visit: Payer: Self-pay | Admitting: Family Medicine

## 2013-10-31 ENCOUNTER — Encounter: Payer: Self-pay | Admitting: Family

## 2013-11-01 ENCOUNTER — Ambulatory Visit: Payer: Self-pay | Admitting: Family

## 2013-11-01 ENCOUNTER — Other Ambulatory Visit (HOSPITAL_COMMUNITY): Payer: Self-pay

## 2013-11-01 ENCOUNTER — Ambulatory Visit: Payer: Medicaid Other | Admitting: Neurosurgery

## 2013-11-01 ENCOUNTER — Other Ambulatory Visit: Payer: Medicaid Other

## 2013-11-02 ENCOUNTER — Telehealth: Payer: Self-pay | Admitting: *Deleted

## 2013-11-02 NOTE — Telephone Encounter (Signed)
He has been referred to pain management and multiple providers have denied him.  Where is Dr. Orland Dec?

## 2013-11-02 NOTE — Telephone Encounter (Signed)
Areatha Keas from [facility] called and left VM requesting that pt have referral to Dr. Orland Dec for pain management. Will route to MD.

## 2013-11-03 ENCOUNTER — Other Ambulatory Visit: Payer: Self-pay | Admitting: Family Medicine

## 2013-11-03 DIAGNOSIS — G894 Chronic pain syndrome: Secondary | ICD-10-CM

## 2013-11-03 NOTE — Telephone Encounter (Signed)
Order in, thanks!

## 2013-11-03 NOTE — Telephone Encounter (Signed)
He is located in [place], [place]

## 2013-11-03 NOTE — Telephone Encounter (Signed)
Thank you :)

## 2013-11-06 ENCOUNTER — Emergency Department (HOSPITAL_COMMUNITY)
Admission: EM | Admit: 2013-11-06 | Discharge: 2013-11-06 | Disposition: A | Discharge status: Home / Self Care | Payer: Medicaid Other | Attending: Emergency Medicine | Admitting: Emergency Medicine

## 2013-11-06 ENCOUNTER — Emergency Department (HOSPITAL_COMMUNITY): Payer: Medicaid Other

## 2013-11-06 ENCOUNTER — Telehealth: Payer: Self-pay | Admitting: *Deleted

## 2013-11-06 ENCOUNTER — Encounter (HOSPITAL_COMMUNITY): Payer: Self-pay | Admitting: Emergency Medicine

## 2013-11-06 DIAGNOSIS — S8263XA Displaced fracture of lateral malleolus of unspecified fibula, initial encounter for closed fracture: Secondary | ICD-10-CM | POA: Insufficient documentation

## 2013-11-06 DIAGNOSIS — Z794 Long term (current) use of insulin: Secondary | ICD-10-CM | POA: Insufficient documentation

## 2013-11-06 DIAGNOSIS — E785 Hyperlipidemia, unspecified: Secondary | ICD-10-CM | POA: Insufficient documentation

## 2013-11-06 DIAGNOSIS — I4892 Unspecified atrial flutter: Secondary | ICD-10-CM | POA: Insufficient documentation

## 2013-11-06 DIAGNOSIS — Z87442 Personal history of urinary calculi: Secondary | ICD-10-CM | POA: Insufficient documentation

## 2013-11-06 DIAGNOSIS — Z79899 Other long term (current) drug therapy: Secondary | ICD-10-CM | POA: Insufficient documentation

## 2013-11-06 DIAGNOSIS — Y939 Activity, unspecified: Secondary | ICD-10-CM | POA: Insufficient documentation

## 2013-11-06 DIAGNOSIS — Z7982 Long term (current) use of aspirin: Secondary | ICD-10-CM | POA: Insufficient documentation

## 2013-11-06 DIAGNOSIS — G609 Hereditary and idiopathic neuropathy, unspecified: Secondary | ICD-10-CM | POA: Insufficient documentation

## 2013-11-06 DIAGNOSIS — IMO0002 Reserved for concepts with insufficient information to code with codable children: Secondary | ICD-10-CM | POA: Insufficient documentation

## 2013-11-06 DIAGNOSIS — S92309A Fracture of unspecified metatarsal bone(s), unspecified foot, initial encounter for closed fracture: Secondary | ICD-10-CM | POA: Insufficient documentation

## 2013-11-06 DIAGNOSIS — Z951 Presence of aortocoronary bypass graft: Secondary | ICD-10-CM | POA: Insufficient documentation

## 2013-11-06 DIAGNOSIS — Y929 Unspecified place or not applicable: Secondary | ICD-10-CM | POA: Insufficient documentation

## 2013-11-06 DIAGNOSIS — G8929 Other chronic pain: Secondary | ICD-10-CM | POA: Insufficient documentation

## 2013-11-06 DIAGNOSIS — Z993 Dependence on wheelchair: Secondary | ICD-10-CM | POA: Insufficient documentation

## 2013-11-06 DIAGNOSIS — Z791 Long term (current) use of non-steroidal anti-inflammatories (NSAID): Secondary | ICD-10-CM | POA: Insufficient documentation

## 2013-11-06 DIAGNOSIS — I2510 Atherosclerotic heart disease of native coronary artery without angina pectoris: Secondary | ICD-10-CM | POA: Insufficient documentation

## 2013-11-06 DIAGNOSIS — Z8673 Personal history of transient ischemic attack (TIA), and cerebral infarction without residual deficits: Secondary | ICD-10-CM | POA: Insufficient documentation

## 2013-11-06 DIAGNOSIS — I10 Essential (primary) hypertension: Secondary | ICD-10-CM | POA: Insufficient documentation

## 2013-11-06 DIAGNOSIS — E119 Type 2 diabetes mellitus without complications: Secondary | ICD-10-CM | POA: Insufficient documentation

## 2013-11-06 DIAGNOSIS — Z87820 Personal history of traumatic brain injury: Secondary | ICD-10-CM | POA: Insufficient documentation

## 2013-11-06 DIAGNOSIS — S8253XA Displaced fracture of medial malleolus of unspecified tibia, initial encounter for closed fracture: Secondary | ICD-10-CM | POA: Insufficient documentation

## 2013-11-06 DIAGNOSIS — F17200 Nicotine dependence, unspecified, uncomplicated: Secondary | ICD-10-CM | POA: Insufficient documentation

## 2013-11-06 DIAGNOSIS — W010XXA Fall on same level from slipping, tripping and stumbling without subsequent striking against object, initial encounter: Secondary | ICD-10-CM | POA: Insufficient documentation

## 2013-11-06 DIAGNOSIS — S92901A Unspecified fracture of right foot, initial encounter for closed fracture: Secondary | ICD-10-CM

## 2013-11-06 MED ORDER — HYDROCODONE-ACETAMINOPHEN 5-325 MG PO TABS: 1.0000 | ORAL_TABLET | Freq: Four times a day (QID) | ORAL | Status: DC | PRN

## 2013-11-06 NOTE — ED Notes (Signed)
Discharge instructions given and reviewed with patient.  Prescription given for Vicodin; effects and use explained.  Patient verbalized understanding to follow up with Dr. Luna Glasgow and keep foot elevated.  Patient discharged home in good condition.  Brother to drive home.

## 2013-11-06 NOTE — Telephone Encounter (Signed)
[name] called and left VM stating that pt had received a letter from Dr. Doren Custard with [facility] stating that it was time for his [place] of carotid artery due to it being blocked and she wanted to know if she should schedule Korea with him or somewhere else. Nurse returned call and advised her to stay with MD who has dealt with him before and knows his Hx. She was understanding and appreciative.

## 2013-11-06 NOTE — ED Notes (Signed)
Pt c/o bilateral feet pain since fall yesterday, states he missed step yesterday, denies hitting head yesterday with fall, pt also has neuropathy pain to bilateral feet as well but states this pain is new since fall
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
## 2013-11-06 NOTE — ED Notes (Signed)
Reports falling two days ago. Reports bil feet pain post fall. Also c/o left shoulder pain.  Pt has multiple complaints in triage.

## 2013-11-06 NOTE — Discharge Instructions (Signed)
Follow up with dr. Luna Glasgow this week.  Keep foot elevated

## 2013-11-08 ENCOUNTER — Telehealth: Payer: Self-pay | Admitting: Family Medicine

## 2013-11-08 NOTE — Telephone Encounter (Signed)
I went back through my notes and she did not leave a name or number. The number she left was for pt and she said she was calling from [place]

## 2013-11-08 NOTE — Telephone Encounter (Signed)
I will try to do that, I wasn't able to find the [facility] nurses phone number this morning.

## 2013-11-08 NOTE — Telephone Encounter (Signed)
°        °    °    °  JUST AN FYI Dr Lorra Hals,  I had tried to get Mr. Gonzales in every Pain Management in [facility] and a couple in [place].  Where he has been dismissed for irate behavior I wasn't able to find anyone to take him.  His wife was going to work on finding someone, last I heard she already had him an appt to take care of the pain management. Dr. Jorge Mandril office would not see him.

## 2013-11-08 NOTE — Telephone Encounter (Signed)
Thanks for letting me know this. Please see phone note from 11/02/13 and if we could contact this [facility] nurse that contacted April regarding the pain management referral in order to let them know this.  Thanks.

## 2013-11-09 NOTE — Telephone Encounter (Signed)
Ok that's fine. If she happens to call us again, please relay this message; that Mr Mcfadden has been denied by a number of pain management offices in town as well as surrounding cities. Thanks.

## 2013-11-09 NOTE — Telephone Encounter (Signed)
I will.

## 2013-11-12 ENCOUNTER — Emergency Department (HOSPITAL_COMMUNITY): Payer: Medicaid Other

## 2013-11-12 ENCOUNTER — Emergency Department (HOSPITAL_COMMUNITY)
Admission: EM | Admit: 2013-11-12 | Discharge: 2013-11-12 | Disposition: A | Discharge status: Home / Self Care | Payer: Medicaid Other | Attending: Emergency Medicine | Admitting: Emergency Medicine

## 2013-11-12 ENCOUNTER — Encounter (HOSPITAL_COMMUNITY): Payer: Self-pay | Admitting: Emergency Medicine

## 2013-11-12 DIAGNOSIS — Z794 Long term (current) use of insulin: Secondary | ICD-10-CM | POA: Insufficient documentation

## 2013-11-12 DIAGNOSIS — G8929 Other chronic pain: Secondary | ICD-10-CM | POA: Insufficient documentation

## 2013-11-12 DIAGNOSIS — I69959 Hemiplegia and hemiparesis following unspecified cerebrovascular disease affecting unspecified side: Secondary | ICD-10-CM | POA: Insufficient documentation

## 2013-11-12 DIAGNOSIS — M542 Cervicalgia: Secondary | ICD-10-CM

## 2013-11-12 DIAGNOSIS — I2510 Atherosclerotic heart disease of native coronary artery without angina pectoris: Secondary | ICD-10-CM | POA: Insufficient documentation

## 2013-11-12 DIAGNOSIS — S0993XA Unspecified injury of face, initial encounter: Secondary | ICD-10-CM | POA: Insufficient documentation

## 2013-11-12 DIAGNOSIS — Z951 Presence of aortocoronary bypass graft: Secondary | ICD-10-CM | POA: Insufficient documentation

## 2013-11-12 DIAGNOSIS — E119 Type 2 diabetes mellitus without complications: Secondary | ICD-10-CM | POA: Insufficient documentation

## 2013-11-12 DIAGNOSIS — IMO0002 Reserved for concepts with insufficient information to code with codable children: Secondary | ICD-10-CM | POA: Insufficient documentation

## 2013-11-12 DIAGNOSIS — S46909A Unspecified injury of unspecified muscle, fascia and tendon at shoulder and upper arm level, unspecified arm, initial encounter: Secondary | ICD-10-CM | POA: Insufficient documentation

## 2013-11-12 DIAGNOSIS — Z8781 Personal history of (healed) traumatic fracture: Secondary | ICD-10-CM | POA: Insufficient documentation

## 2013-11-12 DIAGNOSIS — Z87442 Personal history of urinary calculi: Secondary | ICD-10-CM | POA: Insufficient documentation

## 2013-11-12 DIAGNOSIS — Y9389 Activity, other specified: Secondary | ICD-10-CM | POA: Insufficient documentation

## 2013-11-12 DIAGNOSIS — M25511 Pain in right shoulder: Secondary | ICD-10-CM

## 2013-11-12 DIAGNOSIS — S4980XA Other specified injuries of shoulder and upper arm, unspecified arm, initial encounter: Secondary | ICD-10-CM | POA: Insufficient documentation

## 2013-11-12 DIAGNOSIS — W1809XA Striking against other object with subsequent fall, initial encounter: Secondary | ICD-10-CM | POA: Insufficient documentation

## 2013-11-12 DIAGNOSIS — Z791 Long term (current) use of non-steroidal anti-inflammatories (NSAID): Secondary | ICD-10-CM | POA: Insufficient documentation

## 2013-11-12 DIAGNOSIS — Z79899 Other long term (current) drug therapy: Secondary | ICD-10-CM | POA: Insufficient documentation

## 2013-11-12 DIAGNOSIS — Z87820 Personal history of traumatic brain injury: Secondary | ICD-10-CM | POA: Insufficient documentation

## 2013-11-12 DIAGNOSIS — F17200 Nicotine dependence, unspecified, uncomplicated: Secondary | ICD-10-CM | POA: Insufficient documentation

## 2013-11-12 DIAGNOSIS — S199XXA Unspecified injury of neck, initial encounter: Secondary | ICD-10-CM

## 2013-11-12 DIAGNOSIS — Z7982 Long term (current) use of aspirin: Secondary | ICD-10-CM | POA: Insufficient documentation

## 2013-11-12 DIAGNOSIS — W19XXXA Unspecified fall, initial encounter: Secondary | ICD-10-CM

## 2013-11-12 DIAGNOSIS — Y92009 Unspecified place in unspecified non-institutional (private) residence as the place of occurrence of the external cause: Secondary | ICD-10-CM | POA: Insufficient documentation

## 2013-11-12 DIAGNOSIS — E785 Hyperlipidemia, unspecified: Secondary | ICD-10-CM | POA: Insufficient documentation

## 2013-11-12 DIAGNOSIS — I10 Essential (primary) hypertension: Secondary | ICD-10-CM | POA: Insufficient documentation

## 2013-11-12 DIAGNOSIS — I4892 Unspecified atrial flutter: Secondary | ICD-10-CM | POA: Insufficient documentation

## 2013-11-12 DIAGNOSIS — G609 Hereditary and idiopathic neuropathy, unspecified: Secondary | ICD-10-CM | POA: Insufficient documentation

## 2013-11-12 HISTORY — DX: Other chronic pain: G89.29

## 2013-11-12 MED ORDER — METHOCARBAMOL 750 MG PO TABS: ORAL_TABLET | ORAL | Status: DC

## 2013-11-12 NOTE — Discharge Instructions (Signed)
Ice packs to the injured areas. Take the robaxin with acetaminophen 650 mg 3 times a day for pain. Return to the ED for any problems on the head injury sheet or if you feel worse.

## 2013-11-12 NOTE — ED Notes (Signed)
Pt states he lost his balance and fell. C-Collar in place

## 2013-11-12 NOTE — ED Notes (Signed)
Patient given discharge instruction, verbalized understand. Patient wheelchair out of the department.  

## 2013-11-12 NOTE — ED Notes (Signed)
Pt fell while at home. Pain to right shoulder. Neck pain after moving pt on scene, no tenderness per EMS. Pt is drowsy on arrival.
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
## 2013-11-14 ENCOUNTER — Other Ambulatory Visit: Payer: Self-pay | Admitting: Family Medicine

## 2013-11-21 ENCOUNTER — Ambulatory Visit: Payer: Medicaid Other | Admitting: Family Medicine

## 2013-12-06 ENCOUNTER — Telehealth: Payer: Self-pay | Admitting: *Deleted

## 2013-12-06 NOTE — Telephone Encounter (Signed)
This patient no longer comes here and the wife called and stated they have found another PCP. This is why this wasn't seen back; I'm no longer his PCP

## 2013-12-06 NOTE — Telephone Encounter (Signed)
Tiffany from [facility] stated that she faxed a Plan of Care to office on 11/09/2013 and has never received anything back. She stated that she really needed it filled out and faxed back ASAP. Will refax another if needed

## 2013-12-06 NOTE — Telephone Encounter (Signed)
[facility], Thanks. I wasn't aware of his transfer.

## 2013-12-26 ENCOUNTER — Encounter: Payer: Self-pay | Admitting: Family

## 2013-12-27 ENCOUNTER — Encounter: Payer: Self-pay | Admitting: Family

## 2013-12-27 ENCOUNTER — Ambulatory Visit (INDEPENDENT_AMBULATORY_CARE_PROVIDER_SITE_OTHER): Payer: Medicaid Other | Admitting: Family

## 2013-12-27 ENCOUNTER — Ambulatory Visit (HOSPITAL_COMMUNITY)
Admission: RE | Admit: 2013-12-27 | Discharge: 2013-12-27 | Disposition: A | Discharge status: Home / Self Care | Payer: Medicaid Other | Source: Ambulatory Visit | Attending: Vascular Surgery | Admitting: Vascular Surgery

## 2013-12-27 VITALS — BP 143/80 | HR 78 | Resp 16 | Ht 66.0 in | Wt 284.6 lb

## 2013-12-27 DIAGNOSIS — Z48812 Encounter for surgical aftercare following surgery on the circulatory system: Secondary | ICD-10-CM

## 2013-12-27 DIAGNOSIS — I6529 Occlusion and stenosis of unspecified carotid artery: Secondary | ICD-10-CM | POA: Insufficient documentation
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
## 2013-12-27 NOTE — Patient Instructions (Signed)
Stroke Prevention Some medical conditions and behaviors are associated with an increased chance of having a stroke. You may prevent a stroke by making healthy choices and managing medical conditions. HOW CAN I REDUCE MY RISK OF HAVING A STROKE?   Stay physically active. Get at least 30 minutes of activity on most or all days.  Do not smoke. It may also be helpful to avoid exposure to secondhand smoke.  Limit alcohol use. Moderate alcohol use is considered to be:  No more than 2 drinks per day for men.  No more than 1 drink per day for nonpregnant women.  Eat healthy foods. This involves  Eating 5 or more servings of fruits and vegetables a day.  Following a diet that addresses high blood pressure (hypertension), high cholesterol, diabetes, or obesity.  Manage your cholesterol levels.  A diet low in saturated fat, trans fat, and cholesterol and high in fiber may control cholesterol levels.  Take any prescribed medicines to control cholesterol as directed by your health care provider.  Manage your diabetes.  A controlled-carbohydrate, controlled-sugar diet is recommended to manage diabetes.  Take any prescribed medicines to control diabetes as directed by your health care provider.  Control your hypertension.  A low-salt (sodium), low-saturated fat, low-trans fat, and low-cholesterol diet is recommended to manage hypertension.  Take any prescribed medicines to control hypertension as directed by your health care provider.  Maintain a healthy weight.  A reduced-calorie, low-sodium, low-saturated fat, low-trans fat, low-cholesterol diet is recommended to manage weight.  Stop drug abuse.  Avoid taking birth control pills.  Talk to your health care provider about the risks of taking birth control pills if you are over 35 years old, smoke, get migraines, or have ever had a blood clot.  Get evaluated for sleep disorders (sleep apnea).  Talk to your health care provider about  getting a sleep evaluation if you snore a lot or have excessive sleepiness.  Take medicines as directed by your health care provider.  For some people, aspirin or blood thinners (anticoagulants) are helpful in reducing the risk of forming abnormal blood clots that can lead to stroke. If you have the irregular heart rhythm of atrial fibrillation, you should be on a blood thinner unless there is a good reason you cannot take them.  Understand all your medicine instructions.  Make sure that other other conditions (such as anemia or atherosclerosis) are addressed. SEEK IMMEDIATE MEDICAL CARE IF:   You have sudden weakness or numbness of the face, arm, or leg, especially on one side of the body.  Your face or eyelid droops to one side.  You have sudden confusion.  You have trouble speaking (aphasia) or understanding.  You have sudden trouble seeing in one or both eyes.  You have sudden trouble walking.  You have dizziness.  You have a loss of balance or coordination.  You have a sudden, severe headache with no known cause.  You have new chest pain or an irregular heartbeat. Any of these symptoms may represent a serious problem that is an emergency. Do not wait to see if the symptoms will go away. Get medical help at once. Call your local emergency services  (911 in [place] Do not drive yourself to the hospital. Document Released: 11/05/2004 Document Revised: 07/19/2013 Document Reviewed: 03/31/2013 Exit[employer] Patient Information 2014 [employer].   Smoking Cessation Quitting smoking is important to your health and has many advantages. However, it is not always easy to quit since nicotine is a   very addictive drug. Often times, people try 3 times or more before being able to quit. This document explains the best ways for you to prepare to quit smoking. Quitting takes hard work and a lot of effort, but you can do it. ADVANTAGES OF QUITTING SMOKING  You will live longer, feel better,  and live better.  Your body will feel the impact of quitting smoking almost immediately.  Within 20 minutes, blood pressure decreases. Your pulse returns to its normal level.  After 8 hours, carbon monoxide levels in the blood return to normal. Your oxygen level increases.  After 24 hours, the chance of having a heart attack starts to decrease. Your breath, hair, and body stop smelling like smoke.  After 48 hours, damaged nerve endings begin to recover. Your sense of taste and smell improve.  After 72 hours, the body is virtually free of nicotine. Your bronchial tubes relax and breathing becomes easier.  After 2 to 12 weeks, lungs can hold more air. Exercise becomes easier and circulation improves.  The risk of having a heart attack, stroke, cancer, or lung disease is greatly reduced.  After 1 year, the risk of coronary heart disease is cut in half.  After 5 years, the risk of stroke falls to the same as a nonsmoker.  After 10 years, the risk of lung cancer is cut in half and the risk of other cancers decreases significantly.  After 15 years, the risk of coronary heart disease drops, usually to the level of a nonsmoker.  If you are pregnant, quitting smoking will improve your chances of having a healthy baby.  The people you live with, especially any children, will be healthier.  You will have extra money to spend on things other than cigarettes. QUESTIONS TO THINK ABOUT BEFORE ATTEMPTING TO QUIT You may want to talk about your answers with your caregiver.  Why do you want to quit?  If you tried to quit in the past, what helped and what did not?  What will be the most difficult situations for you after you quit? How will you plan to handle them?  Who can help you through the tough times? Your family? Friends? A caregiver?  What pleasures do you get from smoking? What ways can you still get pleasure if you quit? Here are some questions to ask your caregiver:  How can you  help me to be successful at quitting?  What medicine do you think would be best for me and how should I take it?  What should I do if I need more help?  What is smoking withdrawal like? How can I get information on withdrawal? GET READY  Set a quit date.  Change your environment by getting rid of all cigarettes, ashtrays, matches, and lighters in your home, car, or work. Do not let people smoke in your home.  Review your past attempts to quit. Think about what worked and what did not. GET SUPPORT AND ENCOURAGEMENT You have a better chance of being successful if you have help. You can get support in many ways.  Tell your family, friends, and co-workers that you are going to quit and need their support. Ask them not to smoke around you.  Get individual, group, or telephone counseling and support. Programs are available at local hospitals and health centers. Call your local health department for information about programs in your area.  Spiritual beliefs and practices may help some smokers quit.  Download a "quit meter" on your computer   to keep track of quit statistics, such as how long you have gone without smoking, cigarettes not smoked, and money saved.  Get a self-help book about quitting smoking and staying off of tobacco. LEARN NEW SKILLS AND BEHAVIORS  Distract yourself from urges to smoke. Talk to someone, go for a walk, or occupy your time with a task.  Change your normal routine. Take a different route to work. Drink tea instead of coffee. Eat breakfast in a different place.  Reduce your stress. Take a hot bath, exercise, or read a book.  Plan something enjoyable to do every day. Reward yourself for not smoking.  Explore interactive web-based programs that specialize in helping you quit. GET MEDICINE AND USE IT CORRECTLY Medicines can help you stop smoking and decrease the urge to smoke. Combining medicine with the above behavioral methods and support can greatly increase  your chances of successfully quitting smoking.  Nicotine replacement therapy helps deliver nicotine to your body without the negative effects and risks of smoking. Nicotine replacement therapy includes nicotine gum, lozenges, inhalers, nasal sprays, and skin patches. Some may be available over-the-counter and others require a prescription.  Antidepressant medicine helps people abstain from smoking, but how this works is unknown. This medicine is available by prescription.  Nicotinic receptor partial agonist medicine simulates the effect of nicotine in your brain. This medicine is available by prescription. Ask your caregiver for advice about which medicines to use and how to use them based on your health history. Your caregiver will tell you what side effects to look out for if you choose to be on a medicine or therapy. Carefully read the information on the package. Do not use any other product containing nicotine while using a nicotine replacement product.  RELAPSE OR DIFFICULT SITUATIONS Most relapses occur within the first 3 months after quitting. Do not be discouraged if you start smoking again. Remember, most people try several times before finally quitting. You may have symptoms of withdrawal because your body is used to nicotine. You may crave cigarettes, be irritable, feel very hungry, cough often, get headaches, or have difficulty concentrating. The withdrawal symptoms are only temporary. They are strongest when you first quit, but they will go away within 10 14 days. To reduce the chances of relapse, try to:  Avoid drinking alcohol. Drinking lowers your chances of successfully quitting.  Reduce the amount of caffeine you consume. Once you quit smoking, the amount of caffeine in your body increases and can give you symptoms, such as a rapid heartbeat, sweating, and anxiety.  Avoid smokers because they can make you want to smoke.  Do not let weight gain distract you. Many smokers will gain  weight when they quit, usually less than 10 pounds. Eat a healthy diet and stay active. You can always lose the weight gained after you quit.  Find ways to improve your mood other than smoking. FOR MORE INFORMATION  www.smokefree.gov  Document Released: 09/22/2001 Document Revised: 03/29/2012 Document Reviewed: 01/07/2012 [employer] Patient Information 2014 [employer].  

## 2013-12-28 NOTE — Addendum Note (Signed)
Addended by: Dorthula Rue L on: 12/28/2013 11:44 AM   Modules accepted: Orders

## 2014-02-19 IMAGING — CR DG CHEST 1V PORT
1 series · 1 of 1 positions shown · non-contrast
Comparison: January 09, 2013.

CLINICAL DATA: Status post coronary artery bypass graft

PORTABLE CHEST - 1 VIEW

[AP]
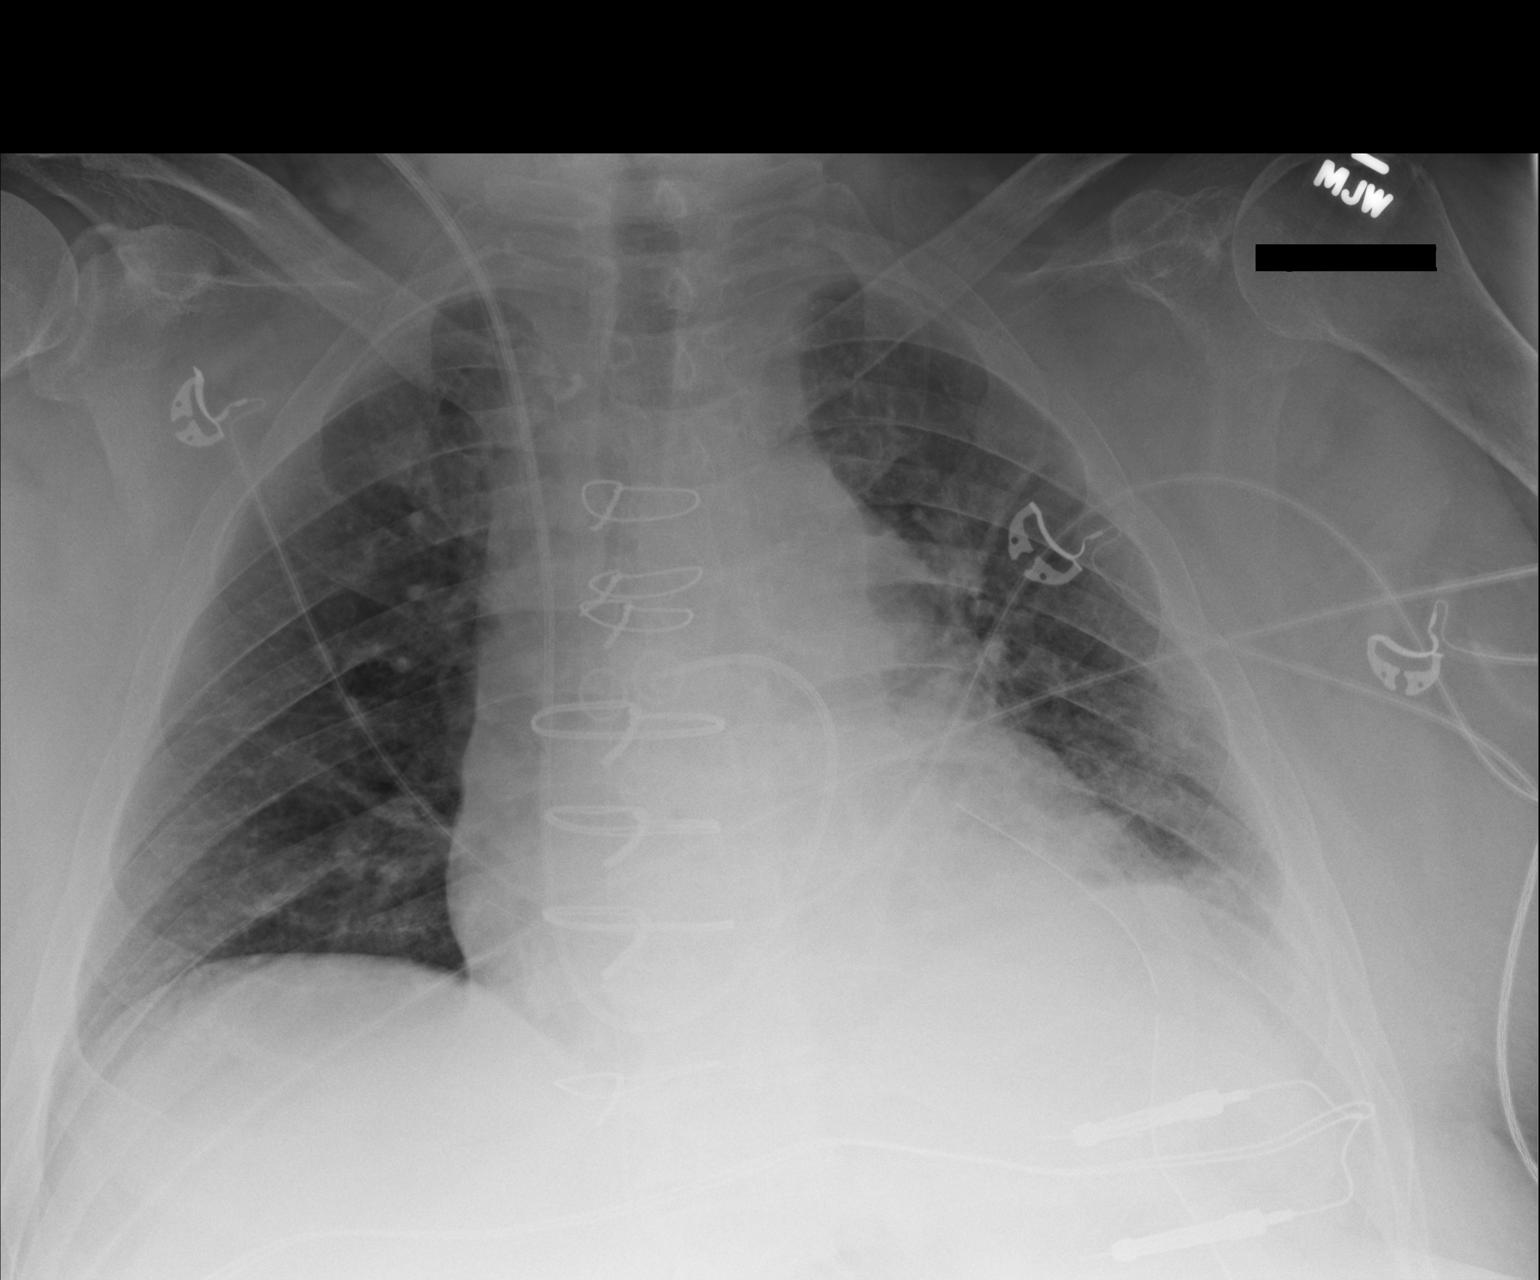

[1 of 1 positions shown; findings below may reference images not displayed]

FINDINGS: Sternotomy wires are noted.  Endotracheal tube has been
removed.  Right internal jugular Swan-Ganz catheter is unchanged in
position with tip directed into right pulmonary artery.  No
pneumothorax is noted.  Left-sided drainage catheter projected over
the heart is unchanged in position.
IMPRESSION: Endotracheal tube has been removed.  No pneumothorax is noted.

## 2014-02-21 IMAGING — CR DG CHEST 1V PORT
2 series · 2 of 2 positions shown · non-contrast
Comparison: 01/11/2013

CLINICAL DATA: Postop.  Check chest tube.

PORTABLE CHEST - 1 VIEW

[AP (1 of 2)]
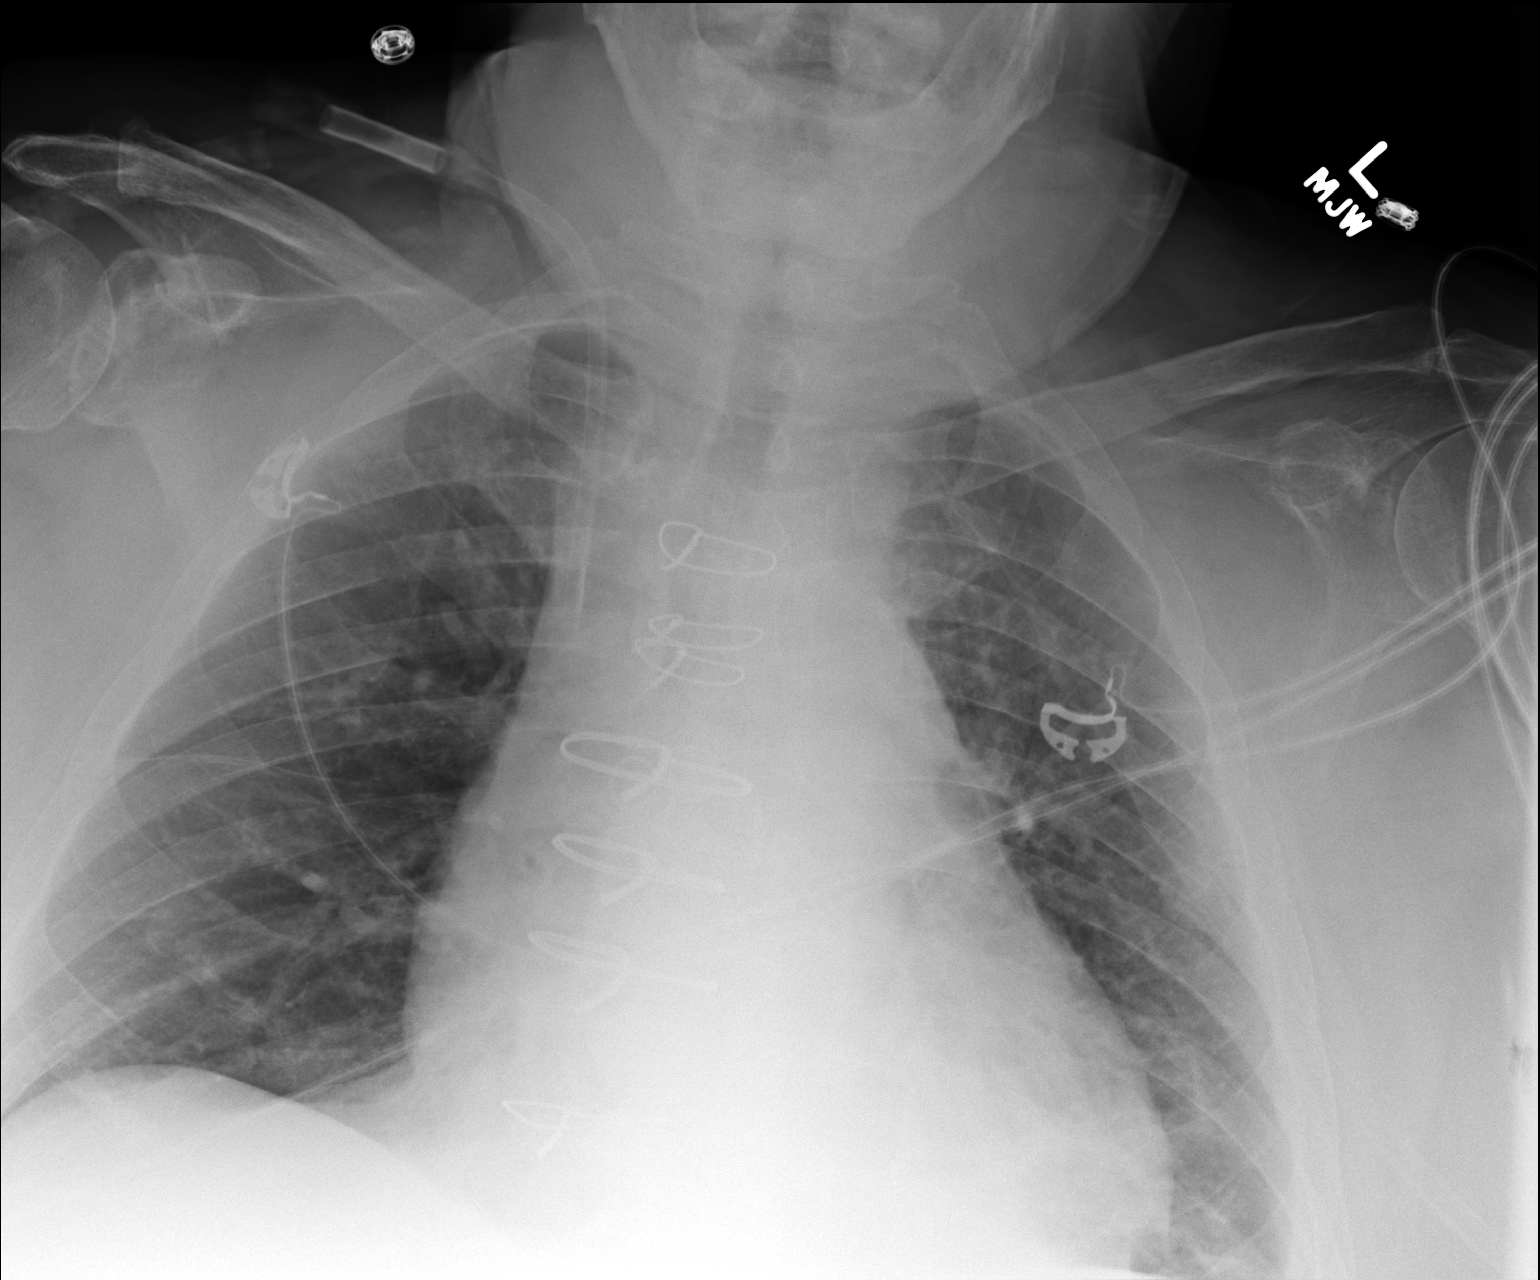

[AP (2 of 2)]
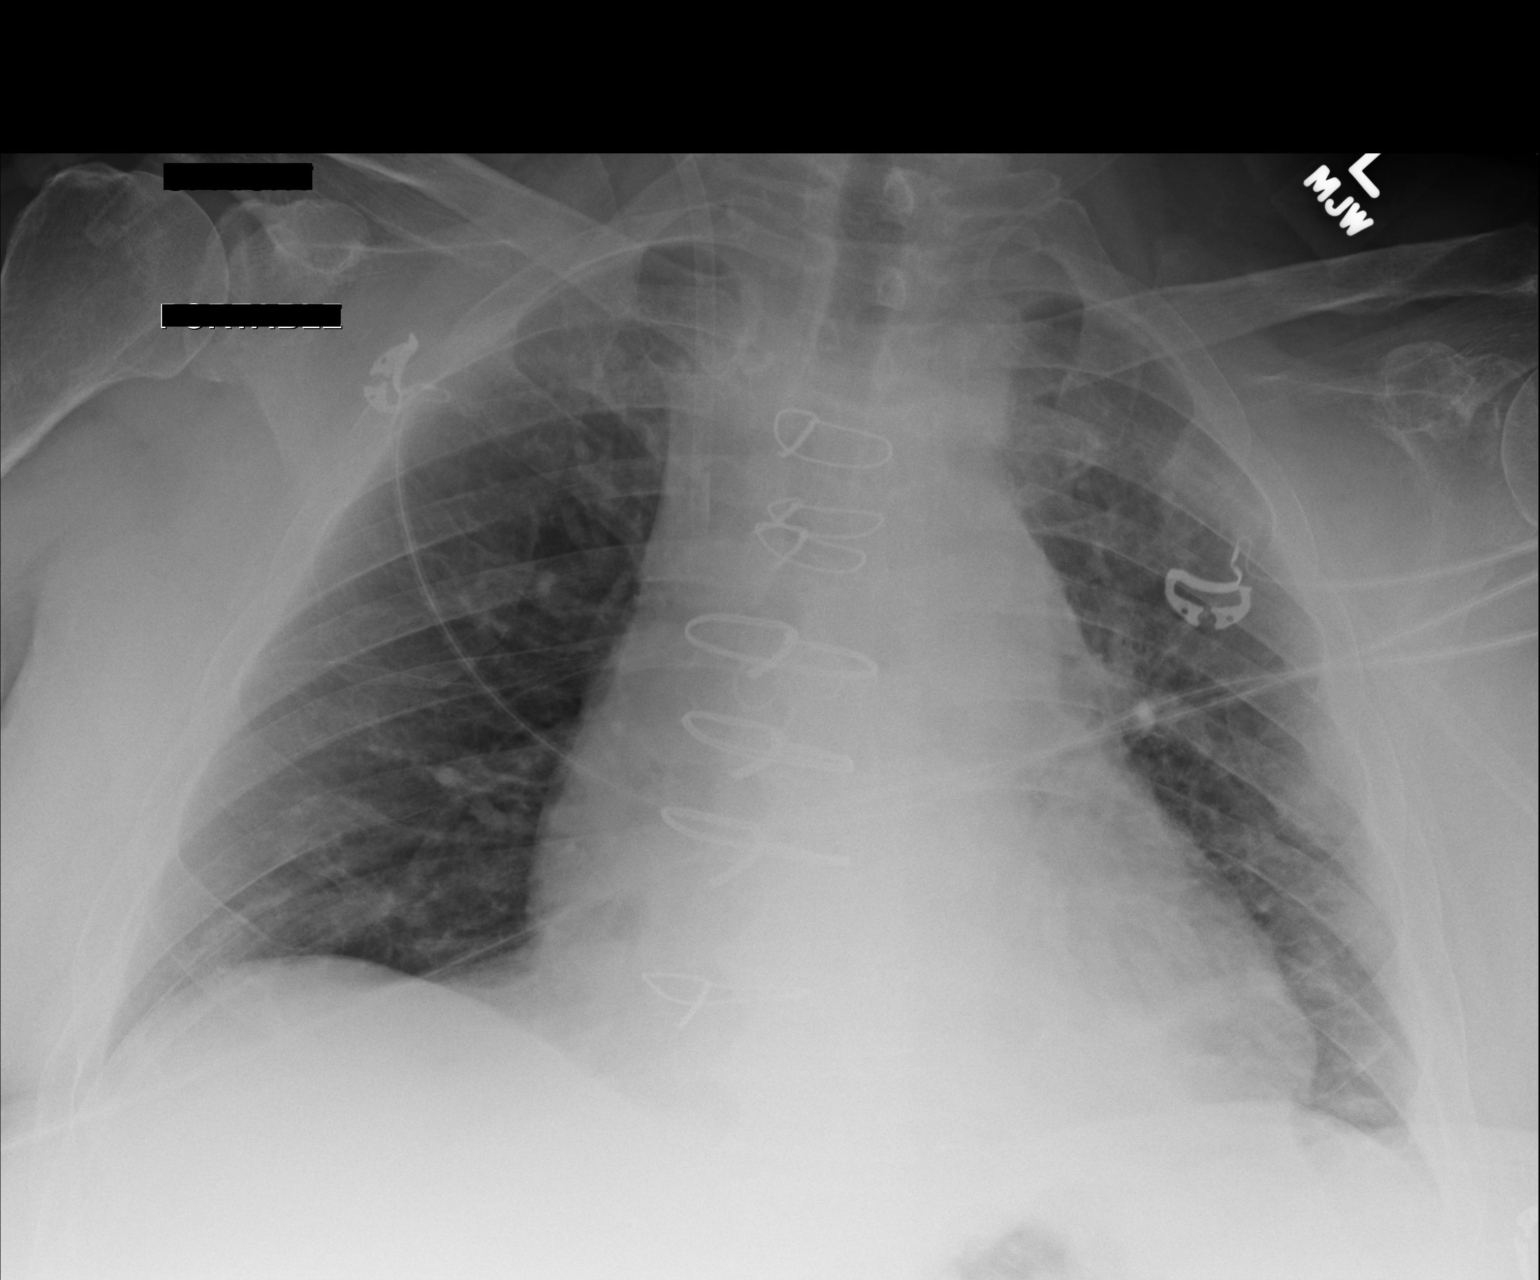

[2 of 2 positions shown; findings below may reference images not displayed]

FINDINGS: Stable appearance of postoperative changes in the
mediastinum.  Left chest tube is not visualized but may be obscured
by the left hemidiaphragm.  Right central venous catheter is
unchanged in position.  Shallow inspiration with bilateral basilar
atelectasis.  Cardiac enlargement.  Pulmonary vascularity is normal
for technique.  No pneumothorax.
IMPRESSION: Cardiac enlargement.  Bilateral basilar atelectasis.  Left chest
tube is not identified but may be obscured by left hemidiaphragm.

## 2014-02-22 IMAGING — CR DG CHEST 2V
2 series · 2 of 2 positions shown · non-contrast
Comparison: Portable chest x-ray of 01/12/2013

CLINICAL DATA: Follow up post CABG

CHEST - 2 VIEW

[w chest lat]
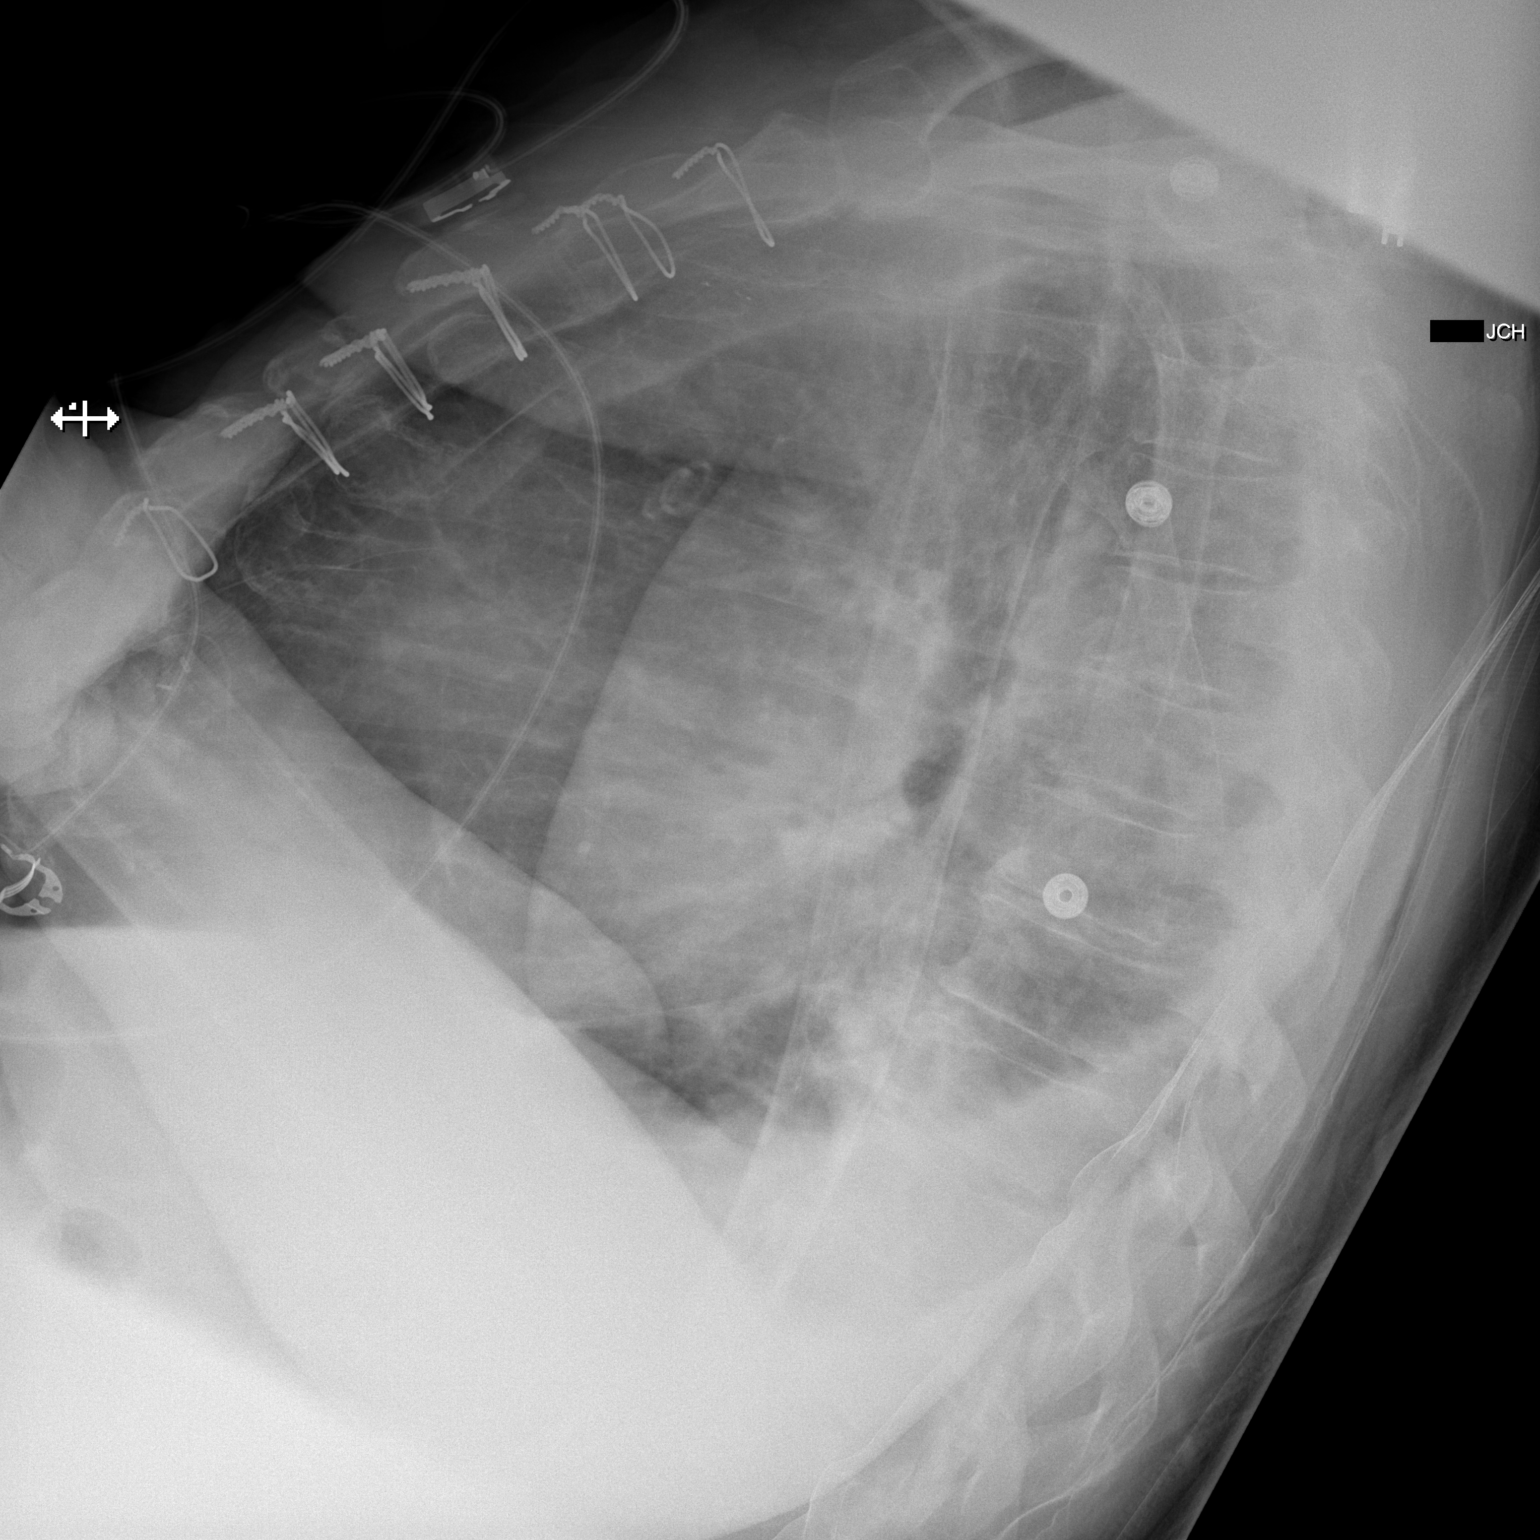

[x chest ap]
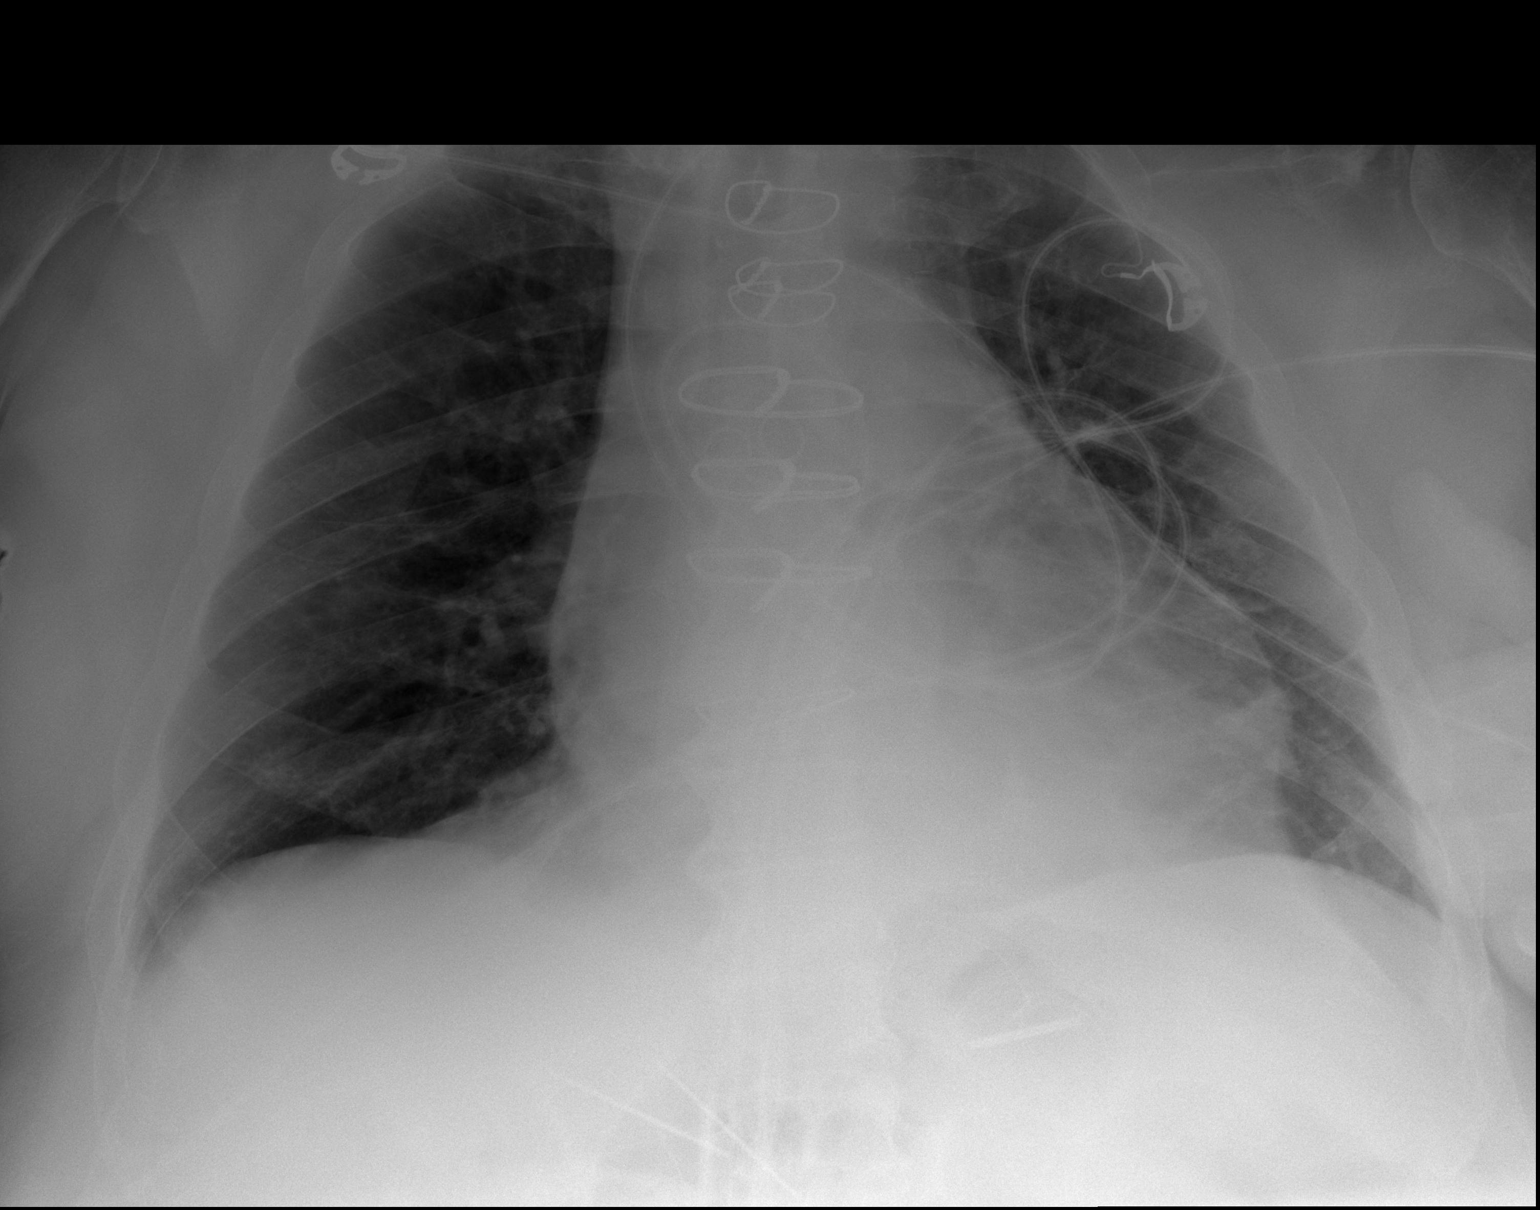

[2 of 2 positions shown; findings below may reference images not displayed]

FINDINGS: The venous sheath has been removed from the IVC.  No
pneumothorax is seen.  The lungs are relatively well aerated.
Cardiomegaly is stable.
IMPRESSION: No active lung disease.  Stable cardiomegaly.

## 2014-03-08 ENCOUNTER — Other Ambulatory Visit: Payer: Self-pay | Admitting: Cardiology

## 2014-03-12 ENCOUNTER — Other Ambulatory Visit: Payer: Self-pay | Admitting: Cardiology

## 2014-06-21 ENCOUNTER — Other Ambulatory Visit (HOSPITAL_COMMUNITY): Payer: Self-pay | Admitting: Nurse Practitioner

## 2014-06-21 ENCOUNTER — Ambulatory Visit (HOSPITAL_COMMUNITY)
Admission: RE | Admit: 2014-06-21 | Discharge: 2014-06-21 | Disposition: A | Discharge status: Home / Self Care | Payer: Medicaid Other | Source: Ambulatory Visit | Attending: Nurse Practitioner | Admitting: Nurse Practitioner

## 2014-06-21 DIAGNOSIS — M25572 Pain in left ankle and joints of left foot: Secondary | ICD-10-CM

## 2014-06-21 DIAGNOSIS — M7730 Calcaneal spur, unspecified foot: Secondary | ICD-10-CM | POA: Diagnosis not present

## 2014-06-21 DIAGNOSIS — M79609 Pain in unspecified limb: Secondary | ICD-10-CM | POA: Insufficient documentation

## 2014-06-21 DIAGNOSIS — M19079 Primary osteoarthritis, unspecified ankle and foot: Secondary | ICD-10-CM | POA: Insufficient documentation

## 2014-06-21 DIAGNOSIS — E119 Type 2 diabetes mellitus without complications: Secondary | ICD-10-CM | POA: Insufficient documentation

## 2014-06-30 ENCOUNTER — Emergency Department (HOSPITAL_COMMUNITY): Payer: Medicaid Other

## 2014-06-30 ENCOUNTER — Inpatient Hospital Stay (HOSPITAL_COMMUNITY)
Admission: EM | Admit: 2014-06-30 | Discharge: 2014-07-01 | DRG: 392 | Disposition: A | Discharge status: Home / Self Care | Payer: Medicaid Other | Attending: Family Medicine | Admitting: Family Medicine

## 2014-06-30 ENCOUNTER — Encounter (HOSPITAL_COMMUNITY): Payer: Self-pay | Admitting: Emergency Medicine

## 2014-06-30 DIAGNOSIS — K219 Gastro-esophageal reflux disease without esophagitis: Principal | ICD-10-CM | POA: Diagnosis present

## 2014-06-30 DIAGNOSIS — R0789 Other chest pain: Secondary | ICD-10-CM | POA: Diagnosis present

## 2014-06-30 DIAGNOSIS — I10 Essential (primary) hypertension: Secondary | ICD-10-CM | POA: Diagnosis present

## 2014-06-30 DIAGNOSIS — M549 Dorsalgia, unspecified: Secondary | ICD-10-CM | POA: Diagnosis present

## 2014-06-30 DIAGNOSIS — Z8673 Personal history of transient ischemic attack (TIA), and cerebral infarction without residual deficits: Secondary | ICD-10-CM | POA: Diagnosis not present

## 2014-06-30 DIAGNOSIS — I25810 Atherosclerosis of coronary artery bypass graft(s) without angina pectoris: Secondary | ICD-10-CM | POA: Diagnosis present

## 2014-06-30 DIAGNOSIS — I498 Other specified cardiac arrhythmias: Secondary | ICD-10-CM | POA: Diagnosis present

## 2014-06-30 DIAGNOSIS — E785 Hyperlipidemia, unspecified: Secondary | ICD-10-CM | POA: Diagnosis present

## 2014-06-30 DIAGNOSIS — Z9989 Dependence on other enabling machines and devices: Secondary | ICD-10-CM

## 2014-06-30 DIAGNOSIS — Z8249 Family history of ischemic heart disease and other diseases of the circulatory system: Secondary | ICD-10-CM

## 2014-06-30 DIAGNOSIS — I4891 Unspecified atrial fibrillation: Secondary | ICD-10-CM | POA: Diagnosis present

## 2014-06-30 DIAGNOSIS — R079 Chest pain, unspecified: Secondary | ICD-10-CM | POA: Diagnosis present

## 2014-06-30 DIAGNOSIS — I798 Other disorders of arteries, arterioles and capillaries in diseases classified elsewhere: Secondary | ICD-10-CM | POA: Diagnosis present

## 2014-06-30 DIAGNOSIS — Z23 Encounter for immunization: Secondary | ICD-10-CM | POA: Diagnosis not present

## 2014-06-30 DIAGNOSIS — G8929 Other chronic pain: Secondary | ICD-10-CM | POA: Diagnosis present

## 2014-06-30 DIAGNOSIS — I2510 Atherosclerotic heart disease of native coronary artery without angina pectoris: Secondary | ICD-10-CM | POA: Diagnosis present

## 2014-06-30 DIAGNOSIS — E1159 Type 2 diabetes mellitus with other circulatory complications: Secondary | ICD-10-CM | POA: Diagnosis present

## 2014-06-30 DIAGNOSIS — I252 Old myocardial infarction: Secondary | ICD-10-CM | POA: Diagnosis not present

## 2014-06-30 DIAGNOSIS — Z833 Family history of diabetes mellitus: Secondary | ICD-10-CM

## 2014-06-30 DIAGNOSIS — F17200 Nicotine dependence, unspecified, uncomplicated: Secondary | ICD-10-CM | POA: Diagnosis present

## 2014-06-30 DIAGNOSIS — G4733 Obstructive sleep apnea (adult) (pediatric): Secondary | ICD-10-CM | POA: Diagnosis present

## 2014-06-30 DIAGNOSIS — D72829 Elevated white blood cell count, unspecified: Secondary | ICD-10-CM | POA: Diagnosis present

## 2014-06-30 LAB — CBC WITH DIFFERENTIAL/PLATELET
Basophils Absolute: 0 10*3/uL (ref 0.0–0.1)
Basophils Relative: 0 % (ref 0–1)
EOS ABS: 0 10*3/uL (ref 0.0–0.7)
Eosinophils Relative: 0 % (ref 0–5)
HCT: 39.7 % (ref 39.0–52.0)
Hemoglobin: 13.4 g/dL (ref 13.0–17.0)
Lymphocytes Relative: 23 % (ref 12–46)
Lymphs Abs: 3.3 10*3/uL (ref 0.7–4.0)
MCH: 32.7 pg (ref 26.0–34.0)
MCHC: 33.8 g/dL (ref 30.0–36.0)
MCV: 96.8 fL (ref 78.0–100.0)
Monocytes Absolute: 1.1 10*3/uL — ABNORMAL HIGH (ref 0.1–1.0)
Monocytes Relative: 8 % (ref 3–12)
NEUTROS ABS: 9.9 10*3/uL — AB (ref 1.7–7.7)
Neutrophils Relative %: 69 % (ref 43–77)
PLATELETS: 196 10*3/uL (ref 150–400)
RBC: 4.1 MIL/uL — ABNORMAL LOW (ref 4.22–5.81)
RDW: 13.6 % (ref 11.5–15.5)
WBC: 14.3 10*3/uL — AB (ref 4.0–10.5)

## 2014-06-30 LAB — COMPREHENSIVE METABOLIC PANEL
ALK PHOS: 87 U/L (ref 39–117)
ALT: 13 U/L (ref 0–53)
AST: 12 U/L (ref 0–37)
Albumin: 3.3 g/dL — ABNORMAL LOW (ref 3.5–5.2)
Anion gap: 15 (ref 5–15)
BUN: 10 mg/dL (ref 6–23)
CO2: 26 meq/L (ref 19–32)
Calcium: 8.6 mg/dL (ref 8.4–10.5)
Chloride: 93 mEq/L — ABNORMAL LOW (ref 96–112)
Creatinine, Ser: 0.86 mg/dL (ref 0.50–1.35)
GFR calc Af Amer: >90 mL/min (ref >90)
GFR calc non Af Amer: >90 mL/min (ref >90)
GLUCOSE: 340 mg/dL — AB (ref 70–99)
POTASSIUM: 4.3 meq/L (ref 3.7–5.3)
SODIUM: 134 meq/L — AB (ref 137–147)
TOTAL PROTEIN: 7.3 g/dL (ref 6.0–8.3)
Total Bilirubin: 0.2 mg/dL — ABNORMAL LOW (ref 0.3–1.2)

## 2014-06-30 LAB — GLUCOSE, CAPILLARY
GLUCOSE-CAPILLARY: 226 mg/dL — AB (ref 70–99)
Glucose-Capillary: 299 mg/dL — ABNORMAL HIGH (ref 70–99)

## 2014-06-30 LAB — TROPONIN I
Troponin I: <0.3 ng/mL (ref <0.30)
Troponin I: <0.3 ng/mL (ref <0.30)

## 2014-06-30 MED ORDER — INFLUENZA VAC SPLIT QUAD 0.5 ML IM SUSY
0.5000 mL | PREFILLED_SYRINGE | INTRAMUSCULAR | Status: AC
  Administered 2014-07-01: 0.5 mL via INTRAMUSCULAR
  Filled 2014-06-30: qty 0.5

## 2014-06-30 MED ORDER — HYDROMORPHONE HCL 1 MG/ML IJ SOLN
0.5000 mg | INTRAMUSCULAR | Status: DC | PRN
  Administered 2014-06-30 – 2014-07-01 (×3): 0.5 mg via INTRAVENOUS
  Filled 2014-06-30 (×3): qty 1

## 2014-06-30 MED ORDER — AMIODARONE HCL 200 MG PO TABS
200.0000 mg | ORAL_TABLET | Freq: Every day | ORAL | Status: DC
  Administered 2014-07-01: 200 mg via ORAL
  Filled 2014-06-30: qty 1

## 2014-06-30 MED ORDER — LISINOPRIL 5 MG PO TABS
2.5000 mg | ORAL_TABLET | Freq: Every day | ORAL | Status: DC
  Administered 2014-07-01: 2.5 mg via ORAL
  Filled 2014-06-30: qty 1

## 2014-06-30 MED ORDER — ONDANSETRON HCL 4 MG/2ML IJ SOLN: 4.0000 mg | Freq: Four times a day (QID) | INTRAMUSCULAR | Status: DC | PRN

## 2014-06-30 MED ORDER — GABAPENTIN 300 MG PO CAPS
600.0000 mg | ORAL_CAPSULE | Freq: Three times a day (TID) | ORAL | Status: DC
  Administered 2014-06-30 – 2014-07-01 (×3): 600 mg via ORAL
  Filled 2014-06-30 (×3): qty 2

## 2014-06-30 MED ORDER — INSULIN ASPART 100 UNIT/ML ~~LOC~~ SOLN
0.0000 [IU] | Freq: Every day | SUBCUTANEOUS | Status: DC
  Administered 2014-06-30: 3 [IU] via SUBCUTANEOUS

## 2014-06-30 MED ORDER — NITROGLYCERIN 0.4 MG SL SUBL: 0.4000 mg | SUBLINGUAL_TABLET | SUBLINGUAL | Status: DC | PRN

## 2014-06-30 MED ORDER — POTASSIUM CHLORIDE CRYS ER 20 MEQ PO TBCR
20.0000 meq | EXTENDED_RELEASE_TABLET | Freq: Every day | ORAL | Status: DC
  Administered 2014-07-01: 20 meq via ORAL
  Filled 2014-06-30: qty 1

## 2014-06-30 MED ORDER — GI COCKTAIL ~~LOC~~
30.0000 mL | Freq: Four times a day (QID) | ORAL | Status: DC | PRN
  Administered 2014-06-30: 30 mL via ORAL
  Filled 2014-06-30: qty 30

## 2014-06-30 MED ORDER — ROPINIROLE HCL 0.25 MG PO TABS
0.5000 mg | ORAL_TABLET | Freq: Every day | ORAL | Status: DC
  Administered 2014-06-30: 0.5 mg via ORAL
  Filled 2014-06-30 (×2): qty 1

## 2014-06-30 MED ORDER — HYDROCODONE-ACETAMINOPHEN 7.5-325 MG PO TABS
1.0000 | ORAL_TABLET | ORAL | Status: DC | PRN
  Administered 2014-06-30 – 2014-07-01 (×2): 1 via ORAL
  Filled 2014-06-30 (×2): qty 1

## 2014-06-30 MED ORDER — ACETAMINOPHEN 325 MG PO TABS: 650.0000 mg | ORAL_TABLET | ORAL | Status: DC | PRN

## 2014-06-30 MED ORDER — ASPIRIN EC 325 MG PO TBEC
325.0000 mg | DELAYED_RELEASE_TABLET | Freq: Every day | ORAL | Status: DC
  Administered 2014-07-01: 325 mg via ORAL
  Filled 2014-06-30: qty 1

## 2014-06-30 MED ORDER — HEPARIN BOLUS VIA INFUSION
4000.0000 [IU] | Freq: Once | INTRAVENOUS | Status: DC
  Filled 2014-06-30: qty 4000

## 2014-06-30 MED ORDER — FUROSEMIDE 40 MG PO TABS
40.0000 mg | ORAL_TABLET | Freq: Every day | ORAL | Status: DC
  Administered 2014-07-01: 40 mg via ORAL
  Filled 2014-06-30: qty 1

## 2014-06-30 MED ORDER — FLUTICASONE PROPIONATE 50 MCG/ACT NA SUSP
2.0000 | Freq: Every day | NASAL | Status: DC
Start: 2014-07-01 — End: 2014-07-01
  Administered 2014-07-01: 2 via NASAL
  Filled 2014-06-30: qty 16

## 2014-06-30 MED ORDER — NITROGLYCERIN 2 % TD OINT
1.0000 [in_us] | TOPICAL_OINTMENT | Freq: Once | TRANSDERMAL | Status: AC
  Administered 2014-06-30: 1 [in_us] via TOPICAL
  Filled 2014-06-30: qty 1

## 2014-06-30 MED ORDER — ATORVASTATIN CALCIUM 20 MG PO TABS
20.0000 mg | ORAL_TABLET | Freq: Every day | ORAL | Status: DC
  Administered 2014-06-30: 20 mg via ORAL
  Filled 2014-06-30: qty 1

## 2014-06-30 MED ORDER — HEPARIN (PORCINE) IN NACL 100-0.45 UNIT/ML-% IJ SOLN: 1250.0000 [IU]/h | INTRAMUSCULAR | Status: DC

## 2014-06-30 MED ORDER — MIRTAZAPINE 30 MG PO TABS
15.0000 mg | ORAL_TABLET | Freq: Every day | ORAL | Status: DC
  Administered 2014-06-30: 15 mg via ORAL
  Filled 2014-06-30: qty 1

## 2014-06-30 MED ORDER — HEPARIN SODIUM (PORCINE) 5000 UNIT/ML IJ SOLN
5000.0000 [IU] | Freq: Three times a day (TID) | INTRAMUSCULAR | Status: DC
  Administered 2014-06-30 – 2014-07-01 (×3): 5000 [IU] via SUBCUTANEOUS
  Filled 2014-06-30 (×3): qty 1

## 2014-06-30 MED ORDER — METOPROLOL SUCCINATE ER 25 MG PO TB24
25.0000 mg | ORAL_TABLET | Freq: Every day | ORAL | Status: DC
Start: 2014-07-01 — End: 2014-07-01
  Administered 2014-07-01: 25 mg via ORAL
  Filled 2014-06-30: qty 1

## 2014-06-30 MED ORDER — INSULIN ASPART 100 UNIT/ML ~~LOC~~ SOLN
0.0000 [IU] | Freq: Three times a day (TID) | SUBCUTANEOUS | Status: DC
  Administered 2014-06-30: 5 [IU] via SUBCUTANEOUS
  Administered 2014-07-01: 11 [IU] via SUBCUTANEOUS
  Administered 2014-07-01: 5 [IU] via SUBCUTANEOUS

## 2014-06-30 MED ORDER — SENNA 8.6 MG PO TABS: 2.0000 | ORAL_TABLET | Freq: Every day | ORAL | Status: DC | PRN

## 2014-06-30 MED ORDER — INSULIN GLARGINE 100 UNIT/ML ~~LOC~~ SOLN
40.0000 [IU] | Freq: Every day | SUBCUTANEOUS | Status: DC
  Administered 2014-06-30: 40 [IU] via SUBCUTANEOUS
  Filled 2014-06-30 (×2): qty 0.4

## 2014-06-30 MED ORDER — FINASTERIDE 5 MG PO TABS
5.0000 mg | ORAL_TABLET | Freq: Every day | ORAL | Status: DC
Start: 2014-06-30 — End: 2014-07-01
  Administered 2014-06-30: 5 mg via ORAL
  Filled 2014-06-30 (×2): qty 1

## 2014-06-30 MED ORDER — PANTOPRAZOLE SODIUM 40 MG PO TBEC
40.0000 mg | DELAYED_RELEASE_TABLET | Freq: Every day | ORAL | Status: DC
  Administered 2014-07-01: 40 mg via ORAL
  Filled 2014-06-30: qty 1

## 2014-06-30 MED ORDER — VENLAFAXINE HCL ER 75 MG PO CP24
150.0000 mg | ORAL_CAPSULE | Freq: Two times a day (BID) | ORAL | Status: DC
  Administered 2014-07-01 (×2): 150 mg via ORAL
  Filled 2014-06-30 (×2): qty 2
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
## 2014-06-30 NOTE — Progress Notes (Signed)
Patient has refused to use one of our CPAP units and has declined to have his from home;patient also refused to wear O 2 at this time while at rest. Patient was made aware that is his stautus changes we will place him eith on CPAP or  a nasal cannula.
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
## 2014-06-30 NOTE — ED Notes (Signed)
See arrival note
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
## 2014-06-30 NOTE — ED Notes (Signed)
Informed wife of pt being admitted.

## 2014-07-01 DIAGNOSIS — K219 Gastro-esophageal reflux disease without esophagitis: Principal | ICD-10-CM

## 2014-07-01 LAB — TROPONIN I: Troponin I: <0.3 ng/mL (ref <0.30)

## 2014-07-01 LAB — GLUCOSE, CAPILLARY
GLUCOSE-CAPILLARY: 315 mg/dL — AB (ref 70–99)
GLUCOSE-CAPILLARY: 238 mg/dL — AB (ref 70–99)

## 2014-07-01 MED ORDER — NITROGLYCERIN 0.4 MG SL SUBL: 0.4000 mg | SUBLINGUAL_TABLET | SUBLINGUAL | Status: DC | PRN

## 2014-07-01 NOTE — Discharge Summary (Signed)
Physician Discharge Summary  [name] NWG:[id number] DOB: [birth year] DOA: 06/30/2014  PCP: [name] Simmer, MD  Admit date: 06/30/2014 Discharge date: 07/01/2014  Recommendations for Outpatient Follow-up:  1. Follow-up atypical chest pain, likely GERD, but see below 2. Leukocytosis of unclear significance. May relate ongoing smoking. Consider CBC as an outpatient.   Follow-up Information   Follow up with [name] Simmer, MD In 3 weeks.   Specialty:  Nurse Practitioner   Contact information:   [place] [address] [phone]       Follow up with Kirk Ruths, MD. Schedule an appointment as soon as possible for a visit in 1 week.   Specialty:  Cardiology   Contact information:   [address] [place] [address] [phone]      Discharge Diagnoses:  1. Atypical chest pain 2. GERD 3. Diabetes mellitus type 2 4. History of atrial flutter 5. Tobacco dependence.  Discharge Condition: improved Disposition: home  Diet recommendation: heart healthy, diabetic  Filed Weights   06/30/14 1428  Weight: 136.986 kg (302 lb)    History of present illness:  63 year old man presented with 2 day history of chest pain. Initial evaluation was unremarkable and patient referred for observation.  Hospital Course:  [name] ruled out with serial troponins. EKG nonacute. Telemetry was sinus rhythm, sinus tachycardia, possible brief SVT. Asymptomatic, no recurrent pain or shortness of breath. Mild upper abdominal pain which has resolved. Tolerating food. Plan discharge home. Consider outpatient GI evaluation if pain recurs. Recommend followup with his cardiologist as the patient was previously evaluated for a flutter.  1. Atypical chest pain, resolved. Likely nonspecific GI in nature, likely carried. Troponins negative. Telemetry sinus rhythm, with intermittent sinus tachycardia or brief SVT. Has been evaluated in past by Dr. Stanford Breed and  Dr. Caryl Comes for h/o aflutter, but patient failed to follow-up. Plan to continue amiodarone and BB. 2. GERD. 3. Diabetes mellitus type 2. 4. Modest leukocytosis, asymptomatic, no evidence of infection, f/u as outpatient 5. OSA 6. H/o CABG, MI 2014, h/o atrial flutter, isolated. 7. Tobacco dependence. Recommend cessation.  Consultants: none Procedures: none  Discharge Instructions  Discharge Instructions   Activity as tolerated - No restrictions    Complete by:  As directed      Diet - low sodium heart healthy    Complete by:  As directed      Diet Carb Modified    Complete by:  As directed      Discharge instructions    Complete by:  As directed   Call physician or seek immediate medical attention for chest pain, shortness of breath, vomiting or worsening of condition.          Current Discharge Medication List    START taking these medications   Details  nitroGLYCERIN (NITROSTAT) 0.4 MG SL tablet Place 1 tablet (0.4 mg total) under the tongue every 5 (five) minutes as needed for chest pain. Qty: 60 tablet, Refills: 0      CONTINUE these medications which have NOT CHANGED   Details  amiodarone (PACERONE) 200 MG tablet Take 200 mg by mouth daily.    aspirin EC 325 MG EC tablet Take 1 tablet (325 mg total) by mouth daily. Qty: 30 tablet    atorvastatin (LIPITOR) 20 MG tablet Take 20 mg by mouth at bedtime.    cetirizine (ZYRTEC) 10 MG tablet Take 10 mg by mouth daily.    ferrous sulfate 325 (65 FE) MG tablet Take  325 mg by mouth daily with breakfast.    FIBER PO Take 1 tablet by mouth 3 (three) times daily.    finasteride (PROSCAR) 5 MG tablet Take 5 mg by mouth daily.    fluticasone (FLONASE) 50 MCG/ACT nasal spray Place 2 sprays into both nostrils daily.    furosemide (LASIX) 40 MG tablet Take 40 mg by mouth daily.    gabapentin (NEURONTIN) 300 MG capsule Take 600 mg by mouth 3 (three) times daily.     HYDROcodone-acetaminophen (NORCO) 7.5-325 MG per tablet Take  1 tablet by mouth every 4 (four) hours as needed for moderate pain.    insulin glargine (LANTUS) 100 UNIT/ML injection Inject 40 Units into the skin at bedtime.     lisinopril (PRINIVIL,ZESTRIL) 2.5 MG tablet Take 2.5 mg by mouth daily.    metFORMIN (GLUCOPHAGE) 1000 MG tablet Take 1,000 mg by mouth 2 (two) times daily with a meal.    mirtazapine (REMERON) 15 MG tablet Take 1 tablet (15 mg total) by mouth at bedtime. Qty: 30 tablet, Refills: 0   Associated Diagnoses: Insomnia    omeprazole (PRILOSEC) 20 MG capsule Take 20 mg by mouth daily.    potassium chloride (MICRO-K) 10 MEQ CR capsule Take 20 mEq by mouth daily.    rOPINIRole (REQUIP) 0.5 MG tablet Take 0.5 mg by mouth at bedtime.    senna (SENOKOT) 8.6 MG tablet Take 2 tablets by mouth daily as needed for constipation.    TOPROL XL 50 MG 24 hr tablet TAKE 1 TABLET BY MOUTH DAILY WITH A MEAL OR IMMEDIATELY FOLLOWING A MEAL. Qty: 30 tablet, Refills: 6    venlafaxine XR (EFFEXOR-XR) 150 MG 24 hr capsule Take 150 mg by mouth 2 (two) times daily. 1 capsule in the morning and 1 capsule at noon.    VOLTAREN 1 % GEL APPLY TO JOINTS 4 TIMES A DAY, AS NEEDED FOR PAIN Qty: 100 g, Refills: 2       Allergies  Allergen Reactions  . Other Anaphylaxis    Muscle relaxers; back on Cyclobenzaprine since 12/21/12.   . Robaxin [Methocarbamol] Rash  . Flomax [Tamsulosin Hcl] Swelling    Swelling around the mouth with rash on face  . Morphine And Related Itching    The results of significant diagnostics from this hospitalization (including imaging, microbiology, ancillary and laboratory) are listed below for reference.    Significant Diagnostic Studies: Dg Chest Portable 1 View  06/30/2014   CLINICAL DATA:  Chest pain.  Hypertension.  Atrial fibrillation.  EXAM: PORTABLE CHEST - 1 VIEW  COMPARISON:  10/31/2013  FINDINGS: Prior CABG. Heart size within normal limits for projection. The lungs appear clear. No pleural effusion identified.   IMPRESSION: 1.  No significant abnormality identified.   Electronically Signed   By: Sherryl Barters M.D.   On: 06/30/2014 12:38   Labs: Basic Metabolic Panel:  Recent Labs Lab 06/30/14 1121  NA 134*  K 4.3  CL 93*  CO2 26  GLUCOSE 340*  BUN 10  CREATININE 0.86  CALCIUM 8.6   Liver Function Tests:  Recent Labs Lab 06/30/14 1121  AST 12  ALT 13  ALKPHOS 87  BILITOT 0.2*  PROT 7.3  ALBUMIN 3.3*   CBC:  Recent Labs Lab 06/30/14 1121  WBC 14.3*  NEUTROABS 9.9*  HGB 13.4  HCT 39.7  MCV 96.8  PLT 196   Cardiac Enzymes:  Recent Labs Lab 06/30/14 1121 06/30/14 2004 07/01/14 0111 07/01/14 0747  TROPONINI <0.30 <0.30 <0.30 <  0.30   CBG:  Recent Labs Lab 06/30/14 1647 06/30/14 2120 07/01/14 0736 07/01/14 1135  GLUCAP 226* 299* 315* 238*    Active Problems:   Type II or unspecified type diabetes mellitus with peripheral circulatory disorders, uncontrolled(250.72)   CAD (coronary artery disease) of artery bypass graft   Chest pain   OSA on CPAP   Time coordinating discharge: 25 minutes  Signed:  [name], MD [facility] 07/01/2014, 12:03 PM

## 2014-07-01 NOTE — Progress Notes (Signed)
Patient hasn't voided all night.  Did a bladder scan x 2, with no large amount of urine showing in both bladder scans.  Notified MD on call.  MD stated if bladder scan did not show more than 250cc, to not I & O cath.  Will try to get patient to spontaneously void later on.  Will pass on to daytime nurse.

## 2014-07-01 NOTE — Plan of Care (Signed)
Problem: Discharge Progression Outcomes Goal: Complications resolved/controlled Outcome: Completed/Met Date Met:  07/01/14 Continues to have abdominal pain

## 2014-07-01 NOTE — Progress Notes (Signed)
PROGRESS NOTE  [name] HDQ:[id number] DOB: [birth year] DOA: 06/30/2014 PCP: [name], MD  Summary: 63 year old man presented with 2 day history of chest pain. Initial evaluation was unremarkable and patient referred for observation.  Assessment/Plan: 1. Atypical chest pain, resolved. Likely nonspecific GI in nature, likely carried. Troponins negative. Telemetry sinus rhythm, with intermittent sinus tachycardia or brief SVT. Has been evaluated in past by Dr. Stanford Breed and Dr. Caryl Comes for h/o aflutter, but patient failed to follow-up. Plan to continue amiodarone and BB. 2. GERD. 3. Diabetes mellitus type 2. 4. OSA 5. H/o CABG, MI 2014, h/o atrial flutter, isolated.   Much better, no CP. Symptoms very atypical, likely GI. Plan discharge home with outpatient follow-up with cardiologist Dr. Stanford Breed.   [name] Hodgkins, MD  [facility]  Pager [phone] If 7PM-7AM, please contact night-coverage at [email or web address], password [facility] 07/01/2014, 11:40 AM  LOS: 1 day   Consultants:    Procedures:    Antibiotics:    HPI/Subjective: Refused CPAP last night. Difficulty voiding overnight, now resolved with large void. Reports intermittent voiding problems at home.  No chest pain, no SOB. Mild upper abd pain much improved. Eating well, had breakfast without pain. Has a h/o GERD.   Objective: Filed Vitals:   06/30/14 1428 06/30/14 2311 07/01/14 0525 07/01/14 1008  BP: 146/77 97/53 103/66 101/52  Pulse: 81 99 99 99  Temp: 98 F (36.7 C) 98.2 F (36.8 C) 97.9 F (36.6 C)   TempSrc: Oral Oral Oral   Resp: 20 15 21    Height: 5\' 9"  (1.753 m)     Weight: 136.986 kg (302 lb)     SpO2: 99% 95% 93%    No intake or output data in the 24 hours ending 07/01/14 1140   Filed Weights   06/30/14 1428  Weight: 136.986 kg (302 lb)    Exam:     Afebrile, VSS  General: appears calm, comfortable  Psych: speech fluent, clear  CV: RRR no mrg. No LE edema. Telemetry SR,  poor quality strips but several episodes of ST vs. SVT.  Respiratory: CTA bilaterally, no w/r/r. Normal resp effort  Abdomen: obese, soft, ntnd; no epigastric or RUQ pain with palpation  Data Reviewed:  troponins negative  CBG stable, 200s up to 315  Scheduled Meds: . amiodarone  200 mg Oral Daily  . aspirin EC  325 mg Oral Daily  . atorvastatin  20 mg Oral QHS  . finasteride  5 mg Oral QHS  . fluticasone  2 spray Each Nare Daily  . furosemide  40 mg Oral Daily  . gabapentin  600 mg Oral TID  . heparin subcutaneous  5,000 Units Subcutaneous 3 times per day  . Influenza vac split quadrivalent PF  0.5 mL Intramuscular Tomorrow-1000  . insulin aspart  0-15 Units Subcutaneous TID WC  . insulin aspart  0-5 Units Subcutaneous QHS  . insulin glargine  40 Units Subcutaneous QHS  . lisinopril  2.5 mg Oral Daily  . metoprolol succinate  25 mg Oral Daily  . mirtazapine  15 mg Oral QHS  . pantoprazole  40 mg Oral Daily  . potassium chloride  20 mEq Oral Daily  . rOPINIRole  0.5 mg Oral QHS  . venlafaxine XR  150 mg Oral BID WC   Continuous Infusions:   Active Problems:   Type II or unspecified type diabetes mellitus with peripheral circulatory disorders, uncontrolled(250.72)   CAD (coronary artery disease) of artery bypass graft   Chest pain   OSA  on CPAP

## 2014-07-03 ENCOUNTER — Telehealth: Payer: Self-pay | Admitting: Cardiology

## 2014-07-03 ENCOUNTER — Telehealth: Payer: Self-pay | Admitting: Family Medicine

## 2014-07-03 NOTE — Telephone Encounter (Signed)
Closed enounter °

## 2014-07-03 NOTE — Telephone Encounter (Signed)
Discussed with [facility] office, they will call patient with appointment <2 weeks.  [name], MD [facility] [phone]

## 2014-07-16 ENCOUNTER — Encounter: Payer: Self-pay | Admitting: Cardiology

## 2014-07-16 ENCOUNTER — Ambulatory Visit (INDEPENDENT_AMBULATORY_CARE_PROVIDER_SITE_OTHER): Payer: Medicaid Other | Admitting: Cardiology

## 2014-07-16 VITALS — BP 108/71 | HR 70 | Ht 66.0 in | Wt 291.6 lb

## 2014-07-16 DIAGNOSIS — R079 Chest pain, unspecified: Secondary | ICD-10-CM

## 2014-07-16 DIAGNOSIS — G4733 Obstructive sleep apnea (adult) (pediatric): Secondary | ICD-10-CM

## 2014-07-16 DIAGNOSIS — I25700 Atherosclerosis of coronary artery bypass graft(s), unspecified, with unstable angina pectoris: Secondary | ICD-10-CM

## 2014-07-16 DIAGNOSIS — Z720 Tobacco use: Secondary | ICD-10-CM

## 2014-07-16 DIAGNOSIS — Z9989 Dependence on other enabling machines and devices: Secondary | ICD-10-CM

## 2014-07-16 DIAGNOSIS — E785 Hyperlipidemia, unspecified: Secondary | ICD-10-CM

## 2014-07-16 DIAGNOSIS — I483 Typical atrial flutter: Secondary | ICD-10-CM

## 2014-07-16 DIAGNOSIS — F17200 Nicotine dependence, unspecified, uncomplicated: Secondary | ICD-10-CM

## 2014-07-16 NOTE — Assessment & Plan Note (Signed)
Chest pain across his chest he believes he was short of breath at the same time. Has not had a stress test since his bypass as he has done quite well until recently. Plan for life in my view he is unable to walk on treadmill secondary to history of right brain stroke with left-sided paralysis.  Has an appointment with Dr. Stanford Breed of followup November 2.

## 2014-07-16 NOTE — Assessment & Plan Note (Signed)
Still smokes 1 pd, discussed importance of stopping, he has no desire to stop.
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
## 2014-07-16 NOTE — Patient Instructions (Addendum)
Your physician has requested that you have a lexiscan myoview. For further information please visit [email or web address]. Please follow instruction sheet, as given.  Your physician discussed the hazards of tobacco use. Tobacco use cessation is recommended and techniques and options to help you quit were discussed. Please try to decrease your cigarette smoking to 10 per day to start.  Keep your scheduled appointment with Dr. Stanford Breed.

## 2014-07-16 NOTE — Assessment & Plan Note (Signed)
History of atrial flutter seen by Dr. Cleda Mccreedy. Currently on Amiodarone.  Rehospitalization may have had short burst of SVT per discharge summary. Cardiology didnot see the patient on this recent admission.

## 2014-07-16 NOTE — Assessment & Plan Note (Signed)
Lipid Panel     Component Value Date/Time   CHOL 167 09/26/2013 1132   TRIG 182* 09/26/2013 1132   HDL 34* 09/26/2013 1132   CHOLHDL 4.9 09/26/2013 1132   VLDL 36 09/26/2013 1132   LDLCALC 97 09/26/2013 1132

## 2014-07-16 NOTE — Assessment & Plan Note (Signed)
stable °

## 2014-07-17 ENCOUNTER — Other Ambulatory Visit (HOSPITAL_COMMUNITY): Payer: Self-pay | Admitting: Cardiology

## 2014-07-17 DIAGNOSIS — I25709 Atherosclerosis of coronary artery bypass graft(s), unspecified, with unspecified angina pectoris: Secondary | ICD-10-CM

## 2014-07-18 ENCOUNTER — Encounter (HOSPITAL_COMMUNITY): Payer: Self-pay | Admitting: *Deleted

## 2014-07-24 NOTE — Progress Notes (Signed)
UR chart review completed.  

## 2014-07-26 ENCOUNTER — Telehealth (HOSPITAL_COMMUNITY): Payer: Self-pay

## 2014-07-26 NOTE — Telephone Encounter (Signed)
Encounter complete. 

## 2014-07-27 NOTE — Telephone Encounter (Signed)
Encounter complete. 

## 2014-07-31 ENCOUNTER — Ambulatory Visit (HOSPITAL_COMMUNITY)
Admission: RE | Admit: 2014-07-31 | Discharge: 2014-07-31 | Disposition: A | Discharge status: Home / Self Care | Payer: Medicaid Other | Source: Ambulatory Visit | Attending: Cardiovascular Disease | Admitting: Cardiovascular Disease

## 2014-07-31 DIAGNOSIS — F17210 Nicotine dependence, cigarettes, uncomplicated: Secondary | ICD-10-CM | POA: Diagnosis not present

## 2014-07-31 DIAGNOSIS — I25709 Atherosclerosis of coronary artery bypass graft(s), unspecified, with unspecified angina pectoris: Secondary | ICD-10-CM

## 2014-07-31 DIAGNOSIS — R0602 Shortness of breath: Secondary | ICD-10-CM | POA: Diagnosis not present

## 2014-07-31 DIAGNOSIS — E669 Obesity, unspecified: Secondary | ICD-10-CM | POA: Diagnosis not present

## 2014-07-31 DIAGNOSIS — E785 Hyperlipidemia, unspecified: Secondary | ICD-10-CM | POA: Insufficient documentation

## 2014-07-31 DIAGNOSIS — I10 Essential (primary) hypertension: Secondary | ICD-10-CM | POA: Diagnosis not present

## 2014-07-31 DIAGNOSIS — I25700 Atherosclerosis of coronary artery bypass graft(s), unspecified, with unstable angina pectoris: Secondary | ICD-10-CM

## 2014-07-31 DIAGNOSIS — E119 Type 2 diabetes mellitus without complications: Secondary | ICD-10-CM | POA: Insufficient documentation

## 2014-07-31 DIAGNOSIS — I739 Peripheral vascular disease, unspecified: Secondary | ICD-10-CM | POA: Insufficient documentation

## 2014-07-31 DIAGNOSIS — R079 Chest pain, unspecified: Secondary | ICD-10-CM | POA: Insufficient documentation

## 2014-07-31 MED ORDER — AMINOPHYLLINE 25 MG/ML IV SOLN: 125.0000 mg | Freq: Once | INTRAVENOUS | Status: DC

## 2014-07-31 MED ORDER — TECHNETIUM TC 99M SESTAMIBI GENERIC - CARDIOLITE: 30.5000 | Freq: Once | INTRAVENOUS | Status: AC | PRN

## 2014-07-31 MED ORDER — REGADENOSON 0.4 MG/5ML IV SOLN: 0.4000 mg | Freq: Once | INTRAVENOUS | Status: DC

## 2014-07-31 NOTE — Procedures (Addendum)
[name] [address] [place] [address] [phone]  Cardiology Nuclear Med Study  [name] is a 63 y.o. male     MRN : [id number]     DOB: Aug 21, 1951  Procedure Date: 07/31/2014  Nuclear Med Background Indication for Stress Test:  [name] History:  CAD;MI;CABG X5-01/09/2013;No prior respiratory history reported;No prior NUC MPI for comparison;ECHO on 05/26/2013-AFIB/FLUTTER;LVEF=50-55% Cardiac Risk Factors: Carotid Disease, CVA, Family History - CAD, Hypertension, IDDM Type 2, Lipids, Obesity, PVD and Smoker  Symptoms:  Chest Pain, Dizziness, DOE, Fatigue and SOB   Nuclear Pre-Procedure Caffeine/Decaff Intake:  1:00am NPO After: 11AM   IV Site: R Forearm  IV 0.9% NS with Angio Cath:  22g  Chest Size (in):  56"  IV Started by: [name] Course, RN  Height: 5\' 6"  (1.676 m)  Cup Size: n/a  BMI:  Body mass index is 47.15 kg/(m^2). Weight:  292 lb (132.45 kg)   Tech Comments:  n/a    Nuclear Med Study 1 or 2 day study: 2 day  Stress Test Type:  [place] Provider:  Kirk Ruths, MD   Resting Radionuclide: Technetium 16m Sestamibi  Resting Radionuclide Dose: 30.5 mCi   Stress Radionuclide:  Technetium 31m Sestamibi  Stress Radionuclide Dose: 31.1 mCi           Stress Protocol Rest HR: 81 Stress HR: 85  Rest BP: 122/75 Stress BP: 113/55  Exercise Time (min): n/a METS: n/a   Predicted Max HR: 158 bpm % Max HR: 58.23 bpm Rate Pressure Product: 11224  Dose of Adenosine (mg):  n/a Dose of Lexiscan: 0.4 mg  Dose of Atropine (mg): n/a Dose of Dobutamine: n/a mcg/kg/min (at max HR)  Stress Test Technologist: Leane Para, CCT Nuclear Technologist: Otho Perl, CNMT   Rest Procedure:  Myocardial perfusion imaging was performed at rest 45 minutes following the intravenous administration of Technetium 53m Sestamibi. Stress Procedure:  The paitnet received IV  Lexiscan 0.4 mg over 15 seconds. Technetium 85m Sestamibi IV injected at 30 seconds.  The patient experienced wheezing and 125 mg Aminophylline IV was administered.  There were no significant changes with Lexiscan.  Quantitative spect images were obtained after a 45 minute delay.  QGS EDV:  64 ml QGS ESV:  37 ml LV Ejection Fraction: 42%     Rest ECG: NSR, diffuse ST depression  Stress ECG: No significant change from baseline ECG  QPS Raw Data Images:  Poor quality images. Unable to exclude left arm from image acquisition due to spastic hemiparesis. There is incomplete image acquisition due to contact between left arm and imaging equipment. Motion is also noted Stress Images:  images are uninterpretable Rest Images:   same Subtraction (SDS):    LV Wall Motion:  cannot be evaluated accurately. Suspect there is an apical wall motion abnormality  Impression Exercise Capacity:  Lexiscan with no exercise. BP Response:  Normal blood pressure response. Clinical Symptoms:  No significant symptoms noted. ECG Impression:  No significant ECG changes with Lexiscan. Comparison with Prior Nuclear Study: No images to compare   Overall Impression:  Uninterpretable study.   Gray Maugeri, MD  08/01/2014 1:20 PM

## 2014-08-01 ENCOUNTER — Ambulatory Visit (HOSPITAL_COMMUNITY)
Admission: RE | Admit: 2014-08-01 | Discharge: 2014-08-01 | Disposition: A | Discharge status: Home / Self Care | Payer: Medicaid Other | Source: Ambulatory Visit | Attending: Cardiovascular Disease | Admitting: Cardiovascular Disease

## 2014-08-01 DIAGNOSIS — I25709 Atherosclerosis of coronary artery bypass graft(s), unspecified, with unspecified angina pectoris: Secondary | ICD-10-CM | POA: Diagnosis not present

## 2014-08-01 DIAGNOSIS — Z951 Presence of aortocoronary bypass graft: Secondary | ICD-10-CM | POA: Insufficient documentation

## 2014-08-01 MED ORDER — TECHNETIUM TC 99M SESTAMIBI GENERIC - CARDIOLITE
30.0000 | Freq: Once | INTRAVENOUS | Status: AC | PRN
  Administered 2014-08-01: 30 via INTRAVENOUS
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
## 2014-08-13 ENCOUNTER — Ambulatory Visit (INDEPENDENT_AMBULATORY_CARE_PROVIDER_SITE_OTHER): Payer: Medicaid Other | Admitting: Cardiology

## 2014-08-13 ENCOUNTER — Encounter: Payer: Self-pay | Admitting: Cardiology

## 2014-08-13 VITALS — BP 100/60 | HR 60 | Ht 67.0 in | Wt 290.0 lb

## 2014-08-13 DIAGNOSIS — I4892 Unspecified atrial flutter: Secondary | ICD-10-CM

## 2014-08-13 DIAGNOSIS — Z720 Tobacco use: Secondary | ICD-10-CM

## 2014-08-13 DIAGNOSIS — I2510 Atherosclerotic heart disease of native coronary artery without angina pectoris: Secondary | ICD-10-CM

## 2014-08-13 DIAGNOSIS — E785 Hyperlipidemia, unspecified: Secondary | ICD-10-CM

## 2014-08-13 DIAGNOSIS — F17200 Nicotine dependence, unspecified, uncomplicated: Secondary | ICD-10-CM

## 2014-08-13 DIAGNOSIS — I10 Essential (primary) hypertension: Secondary | ICD-10-CM

## 2014-08-13 LAB — TSH: TSH: 3.958 u[IU]/mL (ref 0.350–4.500)

## 2014-08-13 NOTE — Patient Instructions (Signed)
Your physician wants you to follow-up in: [place] will receive a reminder letter in the mail two months in advance. If you don't receive a letter, please call our office to schedule the follow-up appointment.   Your physician recommends that you HAVE LAB WORK TODAY  A chest x-ray takes a picture of the organs and structures inside the chest, including the heart, lungs, and blood vessels. This test can show several things, including, whether the heart is enlarges; whether fluid is building up in the lungs; and whether pacemaker / defibrillator leads are still in place. [place]

## 2014-08-13 NOTE — Assessment & Plan Note (Signed)
Patient remains in sinus rhythm. Continue aspirin and amiodarone. I feel that his fall risk outweighs the benefit of anticoagulation. Check TSH and chest x-ray. Recent liver functions normal.

## 2014-08-13 NOTE — Assessment & Plan Note (Signed)
Patient counseled on discontinuing. 

## 2014-08-13 NOTE — Assessment & Plan Note (Signed)
Blood pressure controlled. Continue present medications. 

## 2014-08-13 NOTE — Assessment & Plan Note (Signed)
Continue aspirin and statin. 

## 2014-08-13 NOTE — Assessment & Plan Note (Signed)
Continue ACE inhibitor and beta blocker. 

## 2014-08-13 NOTE — Assessment & Plan Note (Signed)
Previous symptoms were extremely atypical.They last for 3 days continuously. Nuclear study uninterpretable. Plan medical therapy at this point.

## 2014-08-13 NOTE — Assessment & Plan Note (Signed)
Followed by vascular surgery.Continue aspirin and statin. 

## 2014-08-13 NOTE — Assessment & Plan Note (Signed)
Continue statin. 

## 2014-08-15 ENCOUNTER — Encounter (INDEPENDENT_AMBULATORY_CARE_PROVIDER_SITE_OTHER): Payer: Self-pay | Admitting: *Deleted

## 2014-08-20 ENCOUNTER — Emergency Department (HOSPITAL_COMMUNITY)
Admission: EM | Admit: 2014-08-20 | Discharge: 2014-08-20 | Disposition: A | Discharge status: Home / Self Care | Payer: Medicaid Other | Attending: Emergency Medicine | Admitting: Emergency Medicine

## 2014-08-20 ENCOUNTER — Emergency Department (HOSPITAL_COMMUNITY): Payer: Medicaid Other

## 2014-08-20 ENCOUNTER — Encounter (HOSPITAL_COMMUNITY): Payer: Self-pay | Admitting: *Deleted

## 2014-08-20 DIAGNOSIS — I2510 Atherosclerotic heart disease of native coronary artery without angina pectoris: Secondary | ICD-10-CM | POA: Diagnosis not present

## 2014-08-20 DIAGNOSIS — I252 Old myocardial infarction: Secondary | ICD-10-CM | POA: Insufficient documentation

## 2014-08-20 DIAGNOSIS — Z791 Long term (current) use of non-steroidal anti-inflammatories (NSAID): Secondary | ICD-10-CM | POA: Insufficient documentation

## 2014-08-20 DIAGNOSIS — Z79899 Other long term (current) drug therapy: Secondary | ICD-10-CM | POA: Diagnosis not present

## 2014-08-20 DIAGNOSIS — Z720 Tobacco use: Secondary | ICD-10-CM | POA: Insufficient documentation

## 2014-08-20 DIAGNOSIS — G8929 Other chronic pain: Secondary | ICD-10-CM | POA: Diagnosis not present

## 2014-08-20 DIAGNOSIS — R109 Unspecified abdominal pain: Secondary | ICD-10-CM | POA: Diagnosis present

## 2014-08-20 DIAGNOSIS — Z951 Presence of aortocoronary bypass graft: Secondary | ICD-10-CM | POA: Diagnosis not present

## 2014-08-20 DIAGNOSIS — Z7952 Long term (current) use of systemic steroids: Secondary | ICD-10-CM | POA: Diagnosis not present

## 2014-08-20 DIAGNOSIS — R1012 Left upper quadrant pain: Secondary | ICD-10-CM | POA: Diagnosis not present

## 2014-08-20 DIAGNOSIS — E119 Type 2 diabetes mellitus without complications: Secondary | ICD-10-CM | POA: Insufficient documentation

## 2014-08-20 DIAGNOSIS — I10 Essential (primary) hypertension: Secondary | ICD-10-CM | POA: Diagnosis not present

## 2014-08-20 DIAGNOSIS — Z8673 Personal history of transient ischemic attack (TIA), and cerebral infarction without residual deficits: Secondary | ICD-10-CM | POA: Insufficient documentation

## 2014-08-20 DIAGNOSIS — Z87828 Personal history of other (healed) physical injury and trauma: Secondary | ICD-10-CM | POA: Insufficient documentation

## 2014-08-20 DIAGNOSIS — Z87442 Personal history of urinary calculi: Secondary | ICD-10-CM | POA: Diagnosis not present

## 2014-08-20 DIAGNOSIS — Z794 Long term (current) use of insulin: Secondary | ICD-10-CM | POA: Insufficient documentation

## 2014-08-20 DIAGNOSIS — E785 Hyperlipidemia, unspecified: Secondary | ICD-10-CM | POA: Diagnosis not present

## 2014-08-20 DIAGNOSIS — Z7982 Long term (current) use of aspirin: Secondary | ICD-10-CM | POA: Insufficient documentation

## 2014-08-20 DIAGNOSIS — R52 Pain, unspecified: Secondary | ICD-10-CM

## 2014-08-20 LAB — COMPREHENSIVE METABOLIC PANEL
ALT: 11 U/L (ref 0–53)
ANION GAP: 14 (ref 5–15)
AST: 12 U/L (ref 0–37)
Albumin: 3.6 g/dL (ref 3.5–5.2)
Alkaline Phosphatase: 85 U/L (ref 39–117)
BUN: 19 mg/dL (ref 6–23)
CALCIUM: 9.6 mg/dL (ref 8.4–10.5)
CHLORIDE: 100 meq/L (ref 96–112)
CO2: 27 meq/L (ref 19–32)
Creatinine, Ser: 1 mg/dL (ref 0.50–1.35)
GFR calc Af Amer: >90 mL/min (ref >90)
GFR calc non Af Amer: 79 mL/min — ABNORMAL LOW (ref >90)
Glucose, Bld: 178 mg/dL — ABNORMAL HIGH (ref 70–99)
Potassium: 4.4 mEq/L (ref 3.7–5.3)
Sodium: 141 mEq/L (ref 137–147)
Total Protein: 7.6 g/dL (ref 6.0–8.3)

## 2014-08-20 LAB — CBC WITH DIFFERENTIAL/PLATELET
BASOS PCT: 0 % (ref 0–1)
Basophils Absolute: 0 10*3/uL (ref 0.0–0.1)
EOS PCT: 1 % (ref 0–5)
Eosinophils Absolute: 0.1 10*3/uL (ref 0.0–0.7)
HEMATOCRIT: 40.2 % (ref 39.0–52.0)
HEMOGLOBIN: 13.4 g/dL (ref 13.0–17.0)
Lymphocytes Relative: 28 % (ref 12–46)
Lymphs Abs: 3.1 10*3/uL (ref 0.7–4.0)
MCH: 32.6 pg (ref 26.0–34.0)
MCHC: 33.3 g/dL (ref 30.0–36.0)
MCV: 97.8 fL (ref 78.0–100.0)
MONO ABS: 0.9 10*3/uL (ref 0.1–1.0)
MONOS PCT: 8 % (ref 3–12)
NEUTROS ABS: 6.9 10*3/uL (ref 1.7–7.7)
Neutrophils Relative %: 63 % (ref 43–77)
Platelets: 216 10*3/uL (ref 150–400)
RBC: 4.11 MIL/uL — ABNORMAL LOW (ref 4.22–5.81)
RDW: 14.1 % (ref 11.5–15.5)
WBC: 11 10*3/uL — ABNORMAL HIGH (ref 4.0–10.5)

## 2014-08-20 LAB — LIPASE, BLOOD: Lipase: 30 U/L (ref 11–59)

## 2014-08-20 LAB — URINALYSIS, ROUTINE W REFLEX MICROSCOPIC
BILIRUBIN URINE: NEGATIVE
Glucose, UA: NEGATIVE mg/dL
Hgb urine dipstick: NEGATIVE
Ketones, ur: NEGATIVE mg/dL
Leukocytes, UA: NEGATIVE
Nitrite: NEGATIVE
PROTEIN: NEGATIVE mg/dL
Specific Gravity, Urine: 1.01 (ref 1.005–1.030)
UROBILINOGEN UA: 0.2 mg/dL (ref 0.0–1.0)
pH: 5 (ref 5.0–8.0)

## 2014-08-20 MED ORDER — IOHEXOL 300 MG/ML  SOLN
50.0000 mL | Freq: Once | INTRAMUSCULAR | Status: AC | PRN
  Administered 2014-08-20: 50 mL via ORAL

## 2014-08-20 MED ORDER — IOHEXOL 300 MG/ML  SOLN
100.0000 mL | Freq: Once | INTRAMUSCULAR | Status: AC | PRN
  Administered 2014-08-20: 100 mL via INTRAVENOUS

## 2014-08-20 NOTE — Discharge Instructions (Signed)
Follow up with your md next week. °

## 2014-08-20 NOTE — ED Notes (Signed)
Per EMS pt reports abdominal pain of 6 out of 10 x2 months that worsened today. Denies N/V/D. Denies SOB or chest pain.
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
## 2014-08-24 ENCOUNTER — Ambulatory Visit (HOSPITAL_COMMUNITY)
Admission: RE | Admit: 2014-08-24 | Discharge: 2014-08-24 | Disposition: A | Discharge status: Home / Self Care | Payer: Medicaid Other | Source: Ambulatory Visit | Attending: Cardiology | Admitting: Cardiology

## 2014-08-24 DIAGNOSIS — I10 Essential (primary) hypertension: Secondary | ICD-10-CM | POA: Diagnosis not present

## 2014-08-24 DIAGNOSIS — E119 Type 2 diabetes mellitus without complications: Secondary | ICD-10-CM | POA: Insufficient documentation

## 2014-08-24 DIAGNOSIS — R0602 Shortness of breath: Secondary | ICD-10-CM | POA: Insufficient documentation

## 2014-08-24 DIAGNOSIS — I4892 Unspecified atrial flutter: Secondary | ICD-10-CM

## 2014-08-24 DIAGNOSIS — I2510 Atherosclerotic heart disease of native coronary artery without angina pectoris: Secondary | ICD-10-CM | POA: Insufficient documentation

## 2014-08-24 DIAGNOSIS — R079 Chest pain, unspecified: Secondary | ICD-10-CM | POA: Diagnosis present

## 2014-09-04 ENCOUNTER — Encounter (INDEPENDENT_AMBULATORY_CARE_PROVIDER_SITE_OTHER): Payer: Self-pay | Admitting: Internal Medicine

## 2014-09-04 ENCOUNTER — Ambulatory Visit (INDEPENDENT_AMBULATORY_CARE_PROVIDER_SITE_OTHER): Payer: Medicaid Other | Admitting: Internal Medicine

## 2014-09-04 VITALS — BP 112/70 | HR 76 | Temp 97.7°F | Ht 67.0 in | Wt 290.1 lb

## 2014-09-04 DIAGNOSIS — R1013 Epigastric pain: Secondary | ICD-10-CM

## 2014-09-04 NOTE — Patient Instructions (Signed)
OV 1 yr. Continue the MIralax

## 2014-09-04 NOTE — Progress Notes (Signed)
   Subjective:    Patient ID: [name], male    DOB: 01-May-1951, 63 y.o.   MRN: [id number]  HPI Referred to our office by [name] for abdominal pain. The pain was located in his upper abdomen. He says he was hurting x 2 months.  He tells me he is not hurting now. He says he thinks he was constipated. Has been constipated off an on all his life.  A week ago he was constipated and drank a glass ? Miralax. He says he had a good BM. He says he is taking the MIralax daily now.  He is having a BM daily. No melena or BRRB.  He feels 100% better. Appetite is good. No weight loss. No abdominal pain.  No acid reflux.  No melena or BRRB.  He says his last colonoscopy was 3 yrs ago at [facility]. Normal except for one polyp.    CBC    Component Value Date/Time   WBC 11.0* 08/20/2014 1735   RBC 4.11* 08/20/2014 1735   HGB 13.4 08/20/2014 1735   HCT 40.2 08/20/2014 1735   PLT 216 08/20/2014 1735   MCV 97.8 08/20/2014 1735   MCH 32.6 08/20/2014 1735   MCHC 33.3 08/20/2014 1735   RDW 14.1 08/20/2014 1735   LYMPHSABS 3.1 08/20/2014 1735   MONOABS 0.9 08/20/2014 1735   EOSABS 0.1 08/20/2014 1735   BASOSABS 0.0 08/20/2014 1735   CMP Latest Ref Rng 08/20/2014 06/30/2014 09/26/2013  Glucose 70 - 99 mg/dL 178(H) 340(H) 113(H)  BUN 6 - 23 mg/dL 19 10 17   Creatinine 0.50 - 1.35 mg/dL 1.00 0.86 1.25  Sodium 137 - 147 mEq/L 141 134(L) 136  Potassium 3.7 - 5.3 mEq/L 4.4 4.3 4.5  Chloride 96 - 112 mEq/L 100 93(L) 100  CO2 19 - 32 mEq/L 27 26 23   Calcium 8.4 - 10.5 mg/dL 9.6 8.6 8.9  Total Protein 6.0 - 8.3 g/dL 7.6 7.3 7.3  Total Bilirubin 0.3 - 1.2 mg/dL <0.2(L) 0.2(L) 0.4  Alkaline Phos 39 - 117 U/L 85 87 89  AST 0 - 37 U/L 12 12 16   ALT 0 - 53 U/L 11 13 13        08/20/2014 CT abdomen/pelvis with CM: abdominal pain  IMPRESSION: 1. Subpleural ground-glass attenuation RIGHT middle lobe 6 mm pulmonary nodule. Initial follow-up by chest CT without contrast is recommended in 3 months to  confirm persistence. This recommendation follows the consensus statement: Recommendations for the Management of Subsolid Pulmonary Nodules Detected at CT: A Statement from the [place] as published in Radiology 2013; 266:304-317. 2. No acute abdominal abnormality.  Last colonoscopy:   Review of Systems Married. One child in good health.     Objective:   Physical Exam  Filed Vitals:   09/04/14 1113  Height: 5\' 7"  (1.702 m)  Weight: 290 lb 1.6 oz (131.588 kg)  Examined from Wheelchair.  Alert and oriented. Skin warm and dry. Oral mucosa is moist.   . Sclera anicteric, conjunctivae is pink. Thyroid not enlarged. No cervical lymphadenopathy. Lungs clear. Heart regular rate and rhythm.  Abdomen is soft. Bowel sounds are positive. No hepatomegaly. No abdominal masses felt. Abdomen obese No tenderness.  No edema to lower extremities.         Assessment & Plan:  Abdominal pain resolved at this time. Probably constipation. Patient is on chronic Narcotic therapy for chronic pain left side.

## 2014-09-20 ENCOUNTER — Encounter (HOSPITAL_COMMUNITY): Payer: Self-pay | Admitting: Cardiovascular Disease

## 2014-09-27 ENCOUNTER — Encounter (INDEPENDENT_AMBULATORY_CARE_PROVIDER_SITE_OTHER): Payer: Self-pay

## 2014-10-12 DIAGNOSIS — S42309A Unspecified fracture of shaft of humerus, unspecified arm, initial encounter for closed fracture: Secondary | ICD-10-CM

## 2014-10-12 HISTORY — DX: Unspecified fracture of shaft of humerus, unspecified arm, initial encounter for closed fracture: S42.309A

## 2014-10-30 ENCOUNTER — Other Ambulatory Visit: Payer: Self-pay | Admitting: Cardiology

## 2014-12-03 ENCOUNTER — Encounter (INDEPENDENT_AMBULATORY_CARE_PROVIDER_SITE_OTHER): Payer: Self-pay | Admitting: *Deleted

## 2014-12-11 ENCOUNTER — Other Ambulatory Visit: Payer: Self-pay

## 2014-12-11 DIAGNOSIS — T8189XA Other complications of procedures, not elsewhere classified, initial encounter: Secondary | ICD-10-CM

## 2014-12-11 DIAGNOSIS — I6523 Occlusion and stenosis of bilateral carotid arteries: Secondary | ICD-10-CM

## 2014-12-24 ENCOUNTER — Emergency Department (HOSPITAL_COMMUNITY)
Admission: EM | Admit: 2014-12-24 | Discharge: 2014-12-25 | Disposition: A | Discharge status: Home / Self Care | Payer: Medicaid Other | Attending: Emergency Medicine | Admitting: Emergency Medicine

## 2014-12-24 ENCOUNTER — Emergency Department (HOSPITAL_COMMUNITY): Payer: Medicaid Other

## 2014-12-24 ENCOUNTER — Encounter (HOSPITAL_COMMUNITY): Payer: Self-pay | Admitting: Emergency Medicine

## 2014-12-24 DIAGNOSIS — I4892 Unspecified atrial flutter: Secondary | ICD-10-CM | POA: Insufficient documentation

## 2014-12-24 DIAGNOSIS — S4992XA Unspecified injury of left shoulder and upper arm, initial encounter: Secondary | ICD-10-CM | POA: Diagnosis present

## 2014-12-24 DIAGNOSIS — W010XXA Fall on same level from slipping, tripping and stumbling without subsequent striking against object, initial encounter: Secondary | ICD-10-CM | POA: Diagnosis not present

## 2014-12-24 DIAGNOSIS — I2510 Atherosclerotic heart disease of native coronary artery without angina pectoris: Secondary | ICD-10-CM | POA: Insufficient documentation

## 2014-12-24 DIAGNOSIS — Y9289 Other specified places as the place of occurrence of the external cause: Secondary | ICD-10-CM | POA: Diagnosis not present

## 2014-12-24 DIAGNOSIS — Y9389 Activity, other specified: Secondary | ICD-10-CM | POA: Insufficient documentation

## 2014-12-24 DIAGNOSIS — S42292A Other displaced fracture of upper end of left humerus, initial encounter for closed fracture: Secondary | ICD-10-CM | POA: Diagnosis not present

## 2014-12-24 DIAGNOSIS — I252 Old myocardial infarction: Secondary | ICD-10-CM | POA: Insufficient documentation

## 2014-12-24 DIAGNOSIS — E119 Type 2 diabetes mellitus without complications: Secondary | ICD-10-CM | POA: Diagnosis not present

## 2014-12-24 DIAGNOSIS — Z87442 Personal history of urinary calculi: Secondary | ICD-10-CM | POA: Diagnosis not present

## 2014-12-24 DIAGNOSIS — Z720 Tobacco use: Secondary | ICD-10-CM | POA: Diagnosis not present

## 2014-12-24 DIAGNOSIS — I10 Essential (primary) hypertension: Secondary | ICD-10-CM | POA: Diagnosis not present

## 2014-12-24 DIAGNOSIS — G629 Polyneuropathy, unspecified: Secondary | ICD-10-CM | POA: Insufficient documentation

## 2014-12-24 DIAGNOSIS — G8929 Other chronic pain: Secondary | ICD-10-CM | POA: Insufficient documentation

## 2014-12-24 DIAGNOSIS — Z8673 Personal history of transient ischemic attack (TIA), and cerebral infarction without residual deficits: Secondary | ICD-10-CM | POA: Diagnosis not present

## 2014-12-24 DIAGNOSIS — Z791 Long term (current) use of non-steroidal anti-inflammatories (NSAID): Secondary | ICD-10-CM | POA: Diagnosis not present

## 2014-12-24 DIAGNOSIS — S42202A Unspecified fracture of upper end of left humerus, initial encounter for closed fracture: Secondary | ICD-10-CM

## 2014-12-24 DIAGNOSIS — Z951 Presence of aortocoronary bypass graft: Secondary | ICD-10-CM | POA: Diagnosis not present

## 2014-12-24 DIAGNOSIS — Z7951 Long term (current) use of inhaled steroids: Secondary | ICD-10-CM | POA: Insufficient documentation

## 2014-12-24 DIAGNOSIS — Y998 Other external cause status: Secondary | ICD-10-CM | POA: Diagnosis not present

## 2014-12-24 DIAGNOSIS — Z79899 Other long term (current) drug therapy: Secondary | ICD-10-CM | POA: Diagnosis not present

## 2014-12-24 DIAGNOSIS — Z794 Long term (current) use of insulin: Secondary | ICD-10-CM | POA: Insufficient documentation

## 2014-12-24 DIAGNOSIS — Z9889 Other specified postprocedural states: Secondary | ICD-10-CM | POA: Diagnosis not present

## 2014-12-24 MED ORDER — OXYCODONE-ACETAMINOPHEN 7.5-325 MG PO TABS: 1.0000 | ORAL_TABLET | ORAL | Status: DC | PRN

## 2014-12-24 MED ORDER — OXYCODONE-ACETAMINOPHEN 5-325 MG PO TABS
2.0000 | ORAL_TABLET | Freq: Once | ORAL | Status: AC
  Administered 2014-12-24: 2 via ORAL
  Filled 2014-12-24: qty 2
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
## 2014-12-24 NOTE — ED Notes (Signed)
Pt stumbled and fell on left shoulder. Per ems pt's left shoulder was deformed until they moved him to stretcher.

## 2014-12-24 NOTE — Discharge Instructions (Signed)
Humerus Fracture, Treated with Immobilization  The humerus is the large bone in your upper arm. You have a broken (fractured) humerus. These fractures are easily diagnosed with X-rays.  TREATMENT   Simple fractures which will heal without disability are treated with simple immobilization. Immobilization means you will wear a cast, splint, or sling. You have a fracture which will do well with immobilization. The fracture will heal well simply by being held in a good position until it is stable enough to begin range of motion exercises. Do not take part in activities which would further injure your arm.   HOME CARE INSTRUCTIONS    Put ice on the injured area.   Put ice in a plastic bag.   Place a towel between your skin and the bag.   Leave the ice on for 15-20 minutes, 03-04 times a day.   If you have a cast:   Do not scratch the skin under the cast using sharp or pointed objects.   Check the skin around the cast every day. You may put lotion on any red or sore areas.   Keep your cast dry and clean.   If you have a splint:   Wear the splint as directed.   Keep your splint dry and clean.   You may loosen the elastic around the splint if your fingers become numb, tingle, or turn cold or blue.   If you have a sling:   Wear the sling as directed.   Do not put pressure on any part of your cast or splint until it is fully hardened.   Your cast or splint can be protected during bathing with a plastic bag. Do not lower the cast or splint into water.   Only take over-the-counter or prescription medicines for pain, discomfort, or fever as directed by your caregiver.   Do range of motion exercises as instructed by your caregiver.   Follow up as directed by your caregiver. This is very important in order to avoid permanent injury or disability and chronic pain.  SEEK IMMEDIATE MEDICAL CARE IF:    Your skin or nails in the injured arm turn blue or gray.   Your arm feels cold or numb.   You develop severe  pain in the injured arm.   You are having problems with the medicines you were given.  MAKE SURE YOU:    Understand these instructions.   Will watch your condition.   Will get help right away if you are not doing well or get worse.  Document Released: 01/04/2001 Document Revised: 12/21/2011 Document Reviewed: 11/12/2010  Exit[employer] Patient Information 2015 [employer]. This information is not intended to replace advice given to you by your health care provider. Make sure you discuss any questions you have with your health care provider.

## 2014-12-25 ENCOUNTER — Telehealth: Payer: Self-pay | Admitting: Orthopedic Surgery

## 2014-12-25 NOTE — Telephone Encounter (Signed)
I followed up with primary care and authorization/referral is received.  Patient unable to get transportation today (12/25/14); requested Thursday, 12/27/14; scheduled.

## 2014-12-25 NOTE — Telephone Encounter (Signed)
Call from patient/wife for appointment following Emergency Room visit at [facility] for problem of humerus fracture; offered appointment for today, 12/25/14, pending referral from primary care, per insurance requirement.  Patient aware of status.

## 2014-12-26 ENCOUNTER — Other Ambulatory Visit (INDEPENDENT_AMBULATORY_CARE_PROVIDER_SITE_OTHER): Payer: Self-pay | Admitting: *Deleted

## 2014-12-26 ENCOUNTER — Encounter (INDEPENDENT_AMBULATORY_CARE_PROVIDER_SITE_OTHER): Payer: Self-pay | Admitting: *Deleted

## 2014-12-26 DIAGNOSIS — Z86010 Personal history of colonic polyps: Secondary | ICD-10-CM

## 2014-12-27 ENCOUNTER — Encounter: Payer: Self-pay | Admitting: Orthopedic Surgery

## 2014-12-27 ENCOUNTER — Ambulatory Visit (INDEPENDENT_AMBULATORY_CARE_PROVIDER_SITE_OTHER): Payer: Medicaid Other | Admitting: Orthopedic Surgery

## 2014-12-27 VITALS — BP 105/61 | Ht 67.0 in | Wt 290.0 lb

## 2014-12-27 DIAGNOSIS — S42202A Unspecified fracture of upper end of left humerus, initial encounter for closed fracture: Secondary | ICD-10-CM
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
## 2014-12-28 ENCOUNTER — Telehealth (INDEPENDENT_AMBULATORY_CARE_PROVIDER_SITE_OTHER): Payer: Self-pay | Admitting: *Deleted

## 2014-12-28 ENCOUNTER — Other Ambulatory Visit (HOSPITAL_COMMUNITY): Payer: Medicaid Other

## 2014-12-28 ENCOUNTER — Encounter (HOSPITAL_COMMUNITY): Payer: Medicaid Other

## 2014-12-28 NOTE — Telephone Encounter (Signed)
Referring MD/PCP: Ancil Boozer -- [facility]   Procedure: tcs w/ propofol  Patient needs to be prepped at hospital due to brace on leg--fall risk  Reason/Indication:  Hx polyps  Has patient had this procedure before?  Yes, 2011 -- scanned  If so, when, by whom and where?    Is there a family history of colon cancer?  no  Who?  What age when diagnosed?    Is patient diabetic?   yes      Does patient have prosthetic heart valve?  no  Do you have a pacemaker?  no  Has patient ever had endocarditis? no  Has patient had joint replacement within last 12 months?  no  Does patient tend to be constipated or take laxatives? yes  Is patient on Coumadin, Plavix and/or Aspirin? yes  Medications: amiodarone 200 mg daily, asa 325 mg daily, Lipitor 20 mg daily, zyrtec 10 mg daily, Proscar 5 mg daily, Flonase daily, lasix 40 mg daily, gabapentin 600 mg tid, Lantus insulin 40 units @ bedtime, lisinopril 2.5 mg daily, metformin 1000 mg bid, mirtazapine 15 mg @ bedtime, potassium 20 mg daily, ropinirole 0.5 mg @ bedtime, Toprol 50 mg daily, effexor 150 mg bid, voltaren gel up to 4 times a day (shoulder pain), percocet 7.5 mg qid  Allergies: morphine  Medication Adjustment: asa 2 days, decrease Lantus to 20 units @ bedtime evening before and hold metformin days before  Procedure date & time:

## 2014-12-31 ENCOUNTER — Telehealth: Payer: Self-pay | Admitting: Orthopedic Surgery

## 2014-12-31 NOTE — Telephone Encounter (Signed)
SORRY DOSE IS ALREADY TOO HIGH CANT GO UP

## 2014-12-31 NOTE — Telephone Encounter (Signed)
Routing to Dr Harrison 

## 2014-12-31 NOTE — Telephone Encounter (Signed)
Patients wife is calling stating that it is time for pain medication to be refilled, Patient states that the oxyCODONE-acetaminophen (PERCOCET) 7.5-325 MG per tablet [[id number]]  Is no longer working for the pain and he is wondering if there is something else that he could take, please advise?

## 2014-12-31 NOTE — Telephone Encounter (Signed)
She states this med is no longer working

## 2014-12-31 NOTE — Telephone Encounter (Signed)
refill 

## 2015-01-01 ENCOUNTER — Telehealth: Payer: Self-pay | Admitting: *Deleted

## 2015-01-01 ENCOUNTER — Other Ambulatory Visit (HOSPITAL_COMMUNITY): Payer: Self-pay | Admitting: Nurse Practitioner

## 2015-01-01 ENCOUNTER — Other Ambulatory Visit: Payer: Self-pay | Admitting: *Deleted

## 2015-01-01 DIAGNOSIS — R911 Solitary pulmonary nodule: Secondary | ICD-10-CM

## 2015-01-01 MED ORDER — OXYCODONE-ACETAMINOPHEN 7.5-325 MG PO TABS: 1.0000 | ORAL_TABLET | ORAL | Status: DC | PRN

## 2015-01-01 NOTE — Telephone Encounter (Signed)
Med refilled.

## 2015-01-01 NOTE — Telephone Encounter (Signed)
Patient picked up Rx

## 2015-01-02 ENCOUNTER — Emergency Department (HOSPITAL_COMMUNITY): Payer: Medicaid Other

## 2015-01-02 ENCOUNTER — Encounter: Payer: Medicaid Other | Admitting: Vascular Surgery

## 2015-01-02 ENCOUNTER — Other Ambulatory Visit (HOSPITAL_COMMUNITY): Payer: Medicaid Other

## 2015-01-02 ENCOUNTER — Encounter (HOSPITAL_COMMUNITY): Payer: Self-pay | Admitting: Emergency Medicine

## 2015-01-02 ENCOUNTER — Emergency Department (HOSPITAL_COMMUNITY)
Admission: EM | Admit: 2015-01-02 | Discharge: 2015-01-03 | Disposition: A | Discharge status: Home / Self Care | Payer: Medicaid Other | Attending: Emergency Medicine | Admitting: Emergency Medicine

## 2015-01-02 ENCOUNTER — Ambulatory Visit: Payer: Medicaid Other | Admitting: Family

## 2015-01-02 DIAGNOSIS — Y9289 Other specified places as the place of occurrence of the external cause: Secondary | ICD-10-CM | POA: Insufficient documentation

## 2015-01-02 DIAGNOSIS — I252 Old myocardial infarction: Secondary | ICD-10-CM | POA: Diagnosis not present

## 2015-01-02 DIAGNOSIS — Z7952 Long term (current) use of systemic steroids: Secondary | ICD-10-CM | POA: Diagnosis not present

## 2015-01-02 DIAGNOSIS — Z7982 Long term (current) use of aspirin: Secondary | ICD-10-CM | POA: Diagnosis not present

## 2015-01-02 DIAGNOSIS — I2510 Atherosclerotic heart disease of native coronary artery without angina pectoris: Secondary | ICD-10-CM | POA: Diagnosis not present

## 2015-01-02 DIAGNOSIS — Z794 Long term (current) use of insulin: Secondary | ICD-10-CM | POA: Diagnosis not present

## 2015-01-02 DIAGNOSIS — S7002XA Contusion of left hip, initial encounter: Secondary | ICD-10-CM | POA: Insufficient documentation

## 2015-01-02 DIAGNOSIS — S52502D Unspecified fracture of the lower end of left radius, subsequent encounter for closed fracture with routine healing: Secondary | ICD-10-CM | POA: Diagnosis not present

## 2015-01-02 DIAGNOSIS — W19XXXA Unspecified fall, initial encounter: Secondary | ICD-10-CM

## 2015-01-02 DIAGNOSIS — S50812A Abrasion of left forearm, initial encounter: Secondary | ICD-10-CM | POA: Insufficient documentation

## 2015-01-02 DIAGNOSIS — Y9389 Activity, other specified: Secondary | ICD-10-CM | POA: Insufficient documentation

## 2015-01-02 DIAGNOSIS — S42302D Unspecified fracture of shaft of humerus, left arm, subsequent encounter for fracture with routine healing: Secondary | ICD-10-CM

## 2015-01-02 DIAGNOSIS — E119 Type 2 diabetes mellitus without complications: Secondary | ICD-10-CM | POA: Diagnosis not present

## 2015-01-02 DIAGNOSIS — I10 Essential (primary) hypertension: Secondary | ICD-10-CM | POA: Insufficient documentation

## 2015-01-02 DIAGNOSIS — Z791 Long term (current) use of non-steroidal anti-inflammatories (NSAID): Secondary | ICD-10-CM | POA: Diagnosis not present

## 2015-01-02 DIAGNOSIS — Z720 Tobacco use: Secondary | ICD-10-CM | POA: Diagnosis not present

## 2015-01-02 DIAGNOSIS — Z8781 Personal history of (healed) traumatic fracture: Secondary | ICD-10-CM | POA: Insufficient documentation

## 2015-01-02 DIAGNOSIS — Z8673 Personal history of transient ischemic attack (TIA), and cerebral infarction without residual deficits: Secondary | ICD-10-CM | POA: Insufficient documentation

## 2015-01-02 DIAGNOSIS — Z87442 Personal history of urinary calculi: Secondary | ICD-10-CM | POA: Insufficient documentation

## 2015-01-02 DIAGNOSIS — W1839XD Other fall on same level, subsequent encounter: Secondary | ICD-10-CM | POA: Insufficient documentation

## 2015-01-02 DIAGNOSIS — G8929 Other chronic pain: Secondary | ICD-10-CM | POA: Diagnosis not present

## 2015-01-02 DIAGNOSIS — Y998 Other external cause status: Secondary | ICD-10-CM | POA: Diagnosis not present

## 2015-01-02 DIAGNOSIS — M79602 Pain in left arm: Secondary | ICD-10-CM | POA: Diagnosis present

## 2015-01-02 MED ORDER — OXYCODONE-ACETAMINOPHEN 5-325 MG PO TABS
2.0000 | ORAL_TABLET | Freq: Once | ORAL | Status: AC
  Administered 2015-01-02: 2 via ORAL
  Filled 2015-01-02: qty 2

## 2015-01-02 NOTE — ED Notes (Signed)
Patient complaining of left arm pain. Patient recently diagnosed with proximal humerus fracture.

## 2015-01-02 NOTE — Telephone Encounter (Signed)
Patient states, per [facility] pharmacist, Dominica Severin, authorization needed for prescription received for pain medication 01/01/15.  Please advise.
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
## 2015-01-02 NOTE — Telephone Encounter (Signed)
[facility] called and states patient gets regular Oxycodone 7.5 from Dr Luna Glasgow monthly  Filled #120 on 3/5  Advised to hold our script, patient should not run out until end of next week

## 2015-01-03 ENCOUNTER — Encounter: Payer: Self-pay | Admitting: Orthopedic Surgery

## 2015-01-03 ENCOUNTER — Ambulatory Visit (INDEPENDENT_AMBULATORY_CARE_PROVIDER_SITE_OTHER): Payer: Self-pay | Admitting: Orthopedic Surgery

## 2015-01-03 VITALS — BP 108/60 | Ht 67.0 in | Wt 290.0 lb

## 2015-01-03 DIAGNOSIS — S42202D Unspecified fracture of upper end of left humerus, subsequent encounter for fracture with routine healing: Secondary | ICD-10-CM

## 2015-01-03 DIAGNOSIS — S50812A Abrasion of left forearm, initial encounter: Secondary | ICD-10-CM

## 2015-01-03 MED ORDER — CEPHALEXIN 500 MG PO CAPS: 500.0000 mg | ORAL_CAPSULE | Freq: Two times a day (BID) | ORAL | Status: DC

## 2015-01-03 MED ORDER — OXYCODONE-ACETAMINOPHEN 5-325 MG PO TABS: 1.0000 | ORAL_TABLET | ORAL | Status: DC | PRN

## 2015-01-03 MED ORDER — OXYCODONE-ACETAMINOPHEN 10-325 MG PO TABS: 1.0000 | ORAL_TABLET | ORAL | Status: DC | PRN

## 2015-01-03 NOTE — Patient Instructions (Signed)
Call Dr Briscoe Burns office to make appt for next week to check his forearm wound  Start keflex 500 mg twice a day for wound on forearm  Come back here on the 7th as scheduled

## 2015-01-03 NOTE — Discharge Instructions (Signed)
Abrasions An abrasion is a cut or scrape of the skin. Abrasions do not go through all layers of the skin. HOME CARE  If a bandage (dressing) was put on your wound, change it as told by your doctor. If the bandage sticks, soak it off with warm.  Wash the area with water and soap 2 times a day. Rinse off the soap. Pat the area dry with a clean towel.  Put on medicated cream (ointment) as told by your doctor.  Change your bandage right away if it gets wet or dirty.  Only take medicine as told by your doctor.  See your doctor within 24-48 hours to get your wound checked.  Check your wound for redness, puffiness (swelling), or yellowish-white fluid (pus). GET HELP RIGHT AWAY IF:   You have more pain in the wound.  You have redness, swelling, or tenderness around the wound.  You have pus coming from the wound.  You have a fever or lasting symptoms for more than 2-3 days.  You have a fever and your symptoms suddenly get worse.  You have a bad smell coming from the wound or bandage. MAKE SURE YOU:   Understand these instructions.  Will watch your condition.  Will get help right away if you are not doing well or get worse. Document Released: 03/16/2008 Document Revised: 06/22/2012 Document Reviewed: 09/01/2011 [facility] Patient Information 2015 [place], [place]. This information is not intended to replace advice given to you by your health care provider. Make sure you discuss any questions you have with your health care provider.  Cast or Splint Care Casts and splints support injured limbs and keep bones from moving while they heal.  HOME CARE  Keep the cast or splint uncovered during the drying period.  A plaster cast can take 24 to 48 hours to dry.  A fiberglass cast will dry in less than 1 hour.  Do not rest the cast on anything harder than a pillow for 24 hours.  Do not put weight on your injured limb. Do not put pressure on the cast. Wait for your doctor's  approval.  Keep the cast or splint dry.  Cover the cast or splint with a plastic bag during baths or wet weather.  If you have a cast over your chest and belly (trunk), take sponge baths until the cast is taken off.  If your cast gets wet, dry it with a towel or blow dryer. Use the cool setting on the blow dryer.  Keep your cast or splint clean. Wash a dirty cast with a damp cloth.  Do not put any objects under your cast or splint.  Do not scratch the skin under the cast with an object. If itching is a problem, use a blow dryer on a cool setting over the itchy area.  Do not trim or cut your cast.  Do not take out the padding from inside your cast.  Exercise your joints near the cast as told by your doctor.  Raise (elevate) your injured limb on 1 or 2 pillows for the first 1 to 3 days. GET HELP IF:  Your cast or splint cracks.  Your cast or splint is too tight or too loose.  You itch badly under the cast.  Your cast gets wet or has a soft spot.  You have a bad smell coming from the cast.  You get an object stuck under the cast.  Your skin around the cast becomes red or sore.  You have  new or more pain after the cast is put on. GET HELP RIGHT AWAY IF:  You have fluid leaking through the cast.  You cannot move your fingers or toes.  Your fingers or toes turn blue or white or are cool, painful, or puffy (swollen).  You have tingling or lose feeling (numbness) around the injured area.  You have bad pain or pressure under the cast.  You have trouble breathing or have shortness of breath.  You have chest pain. Document Released: 01/28/2011 Document Revised: 05/31/2013 Document Reviewed: 04/06/2013 [facility] Patient Information 2015 [place], [place]. This information is not intended to replace advice given to you by your health care provider. Make sure you discuss any questions you have with your health care provider.

## 2015-01-03 NOTE — ED Provider Notes (Signed)
Follow up on x-rays. No acute fracture. Patient discharged with orthopedic follow-up.  Results for orders placed or performed during the hospital encounter of 08/20/14  CBC with Differential  Result Value Ref Range   WBC 11.0 (H) 4.0 - 10.5 K/uL   RBC 4.11 (L) 4.22 - 5.81 MIL/uL   Hemoglobin 13.4 13.0 - 17.0 g/dL   HCT 40.2 39.0 - 52.0 %   MCV 97.8 78.0 - 100.0 fL   MCH 32.6 26.0 - 34.0 pg   MCHC 33.3 30.0 - 36.0 g/dL   RDW 14.1 11.5 - 15.5 %   Platelets 216 150 - 400 K/uL   Neutrophils Relative % 63 43 - 77 %   Neutro Abs 6.9 1.7 - 7.7 K/uL   Lymphocytes Relative 28 12 - 46 %   Lymphs Abs 3.1 0.7 - 4.0 K/uL   Monocytes Relative 8 3 - 12 %   Monocytes Absolute 0.9 0.1 - 1.0 K/uL   Eosinophils Relative 1 0 - 5 %   Eosinophils Absolute 0.1 0.0 - 0.7 K/uL   Basophils Relative 0 0 - 1 %   Basophils Absolute 0.0 0.0 - 0.1 K/uL  Comprehensive metabolic panel  Result Value Ref Range   Sodium 141 137 - 147 mEq/L   Potassium 4.4 3.7 - 5.3 mEq/L   Chloride 100 96 - 112 mEq/L   CO2 27 19 - 32 mEq/L   Glucose, Bld 178 (H) 70 - 99 mg/dL   BUN 19 6 - 23 mg/dL   Creatinine, Ser 1.00 0.50 - 1.35 mg/dL   Calcium 9.6 8.4 - 10.5 mg/dL   Total Protein 7.6 6.0 - 8.3 g/dL   Albumin 3.6 3.5 - 5.2 g/dL   AST 12 0 - 37 U/L   ALT 11 0 - 53 U/L   Alkaline Phosphatase 85 39 - 117 U/L   Total Bilirubin <0.2 (L) 0.3 - 1.2 mg/dL   GFR calc non Af Amer 79 (L) >90 mL/min   GFR calc Af Amer >90 >90 mL/min   Anion gap 14 5 - 15  Lipase, blood  Result Value Ref Range   Lipase 30 11 - 59 U/L  Urinalysis, Routine w reflex microscopic  Result Value Ref Range   Color, Urine YELLOW YELLOW   APPearance CLEAR CLEAR   Specific Gravity, Urine 1.010 1.005 - 1.030   pH 5.0 5.0 - 8.0   Glucose, UA NEGATIVE NEGATIVE mg/dL   Hgb urine dipstick NEGATIVE NEGATIVE   Bilirubin Urine NEGATIVE NEGATIVE   Ketones, ur NEGATIVE NEGATIVE mg/dL   Protein, ur NEGATIVE NEGATIVE mg/dL   Urobilinogen, UA 0.2 0.0 - 1.0 mg/dL    Nitrite NEGATIVE NEGATIVE   Leukocytes, UA NEGATIVE NEGATIVE   Dg Forearm Left  01/03/2015   CLINICAL DATA:  Fall 10 days prior with left humerus fracture. Now with left forearm pain. History of prior CVA with left wrist contracture.  EXAM: LEFT FOREARM - 2 VIEW  COMPARISON:  Left wrist 04/05/08  FINDINGS: The bones are under mineralized. There is no acute fracture. Sequela of prior distal radius fracture with mild osseous remottling. There is soft tissue edema about the left forearm. Vascular calcifications are seen. No radiopaque foreign body.  IMPRESSION: 1. No acute fracture. 2. Sequela of prior distal radius fracture and bony under mineralization. 3. Soft tissue edema.   Electronically Signed   By: Jeb Levering M.D.   On: 01/03/2015 01:56   Dg Shoulder Left Port  12/24/2014   CLINICAL DATA:  Fall,  left shoulder pain  EXAM: LEFT SHOULDER - 1 VIEW  COMPARISON:  03/11/2012  FINDINGS: Comminuted proximal humeral fracture, displaced, with apex lateral angulation.  IMPRESSION: Comminuted proximal humeral fracture, as above.   Electronically Signed   By: Julian Hy M.D.   On: 12/24/2014 21:50   Dg Hip Unilat With Pelvis 2-3 Views Left  01/03/2015   CLINICAL DATA:  Status post fall 10 days ago, with persistent left hip pain. Initial encounter.  EXAM: LEFT HIP (WITH PELVIS) 2-3 VIEWS  COMPARISON:  CT of the abdomen and pelvis performed 08/20/2014  FINDINGS: There is no evidence of fracture or dislocation. Both femoral heads are seated normally within their respective acetabula. The proximal left femur appears intact. No significant degenerative change is appreciated. The sacroiliac joints are unremarkable in appearance.  The visualized bowel gas pattern is grossly unremarkable in appearance. Diffuse vascular calcifications are seen.  IMPRESSION: 1. No evidence of fracture or dislocation. 2. Diffuse vascular calcifications seen.   Electronically Signed   By: Garald Balding M.D.   On: 01/03/2015  01:57      Merryl Hacker, MD 01/03/15 [phone]

## 2015-01-03 NOTE — Progress Notes (Signed)
Fracture care follow-up  Chief Complaint  Patient presents with  . Fracture    Proximal humerus fracture left, wound developed on forearm    This patient has a proximal humerus fracture which is comminuted. He is deemed nonsurgical candidate based on his multiple medical problems. We placed him in a fracture brace and he presents back with an abrasion over the forearm.  It should be noted that he had a stroke affecting his left side in his left arm is in the flexed elbow flexed wrist thumb and palm deformity which is chronic and cannot be straightened.  This is leading to pressure from the end of the fracture brace which is causing abrasion it is approximately 8 mm long 3 mm wide and superficial there is no surrounding erythema  I changed the fracture brace I padded the area after placing a Xeroform gauze regular gauze Kling and Ace bandage and ABG pad I also placed an ABG pad in his axilla  I called the pharmacy to discuss his oxycodone prescriptions. He chronically takes 7.5 mg proximal 128 every 28 days. I increased his medicine to 10 mg. I also placed him on Keflex 500 mg twice a day for 14 days to prevent infection. It is a prophylactic measure  I will need him to be seen next week to check his forearm abrasion and then I will see him the week following with x-rays

## 2015-01-10 ENCOUNTER — Ambulatory Visit (HOSPITAL_COMMUNITY): Payer: Medicaid Other | Attending: Nurse Practitioner

## 2015-01-14 MED FILL — Oxycodone w/ Acetaminophen Tab 5-325 MG: ORAL | Qty: 6 | Status: AC

## 2015-01-17 ENCOUNTER — Ambulatory Visit (INDEPENDENT_AMBULATORY_CARE_PROVIDER_SITE_OTHER): Payer: Self-pay | Admitting: Orthopedic Surgery

## 2015-01-17 ENCOUNTER — Ambulatory Visit (INDEPENDENT_AMBULATORY_CARE_PROVIDER_SITE_OTHER): Payer: Medicaid Other

## 2015-01-17 VITALS — BP 99/55 | Ht 67.0 in | Wt 290.0 lb

## 2015-01-17 DIAGNOSIS — S42202D Unspecified fracture of upper end of left humerus, subsequent encounter for fracture with routine healing: Secondary | ICD-10-CM | POA: Diagnosis not present

## 2015-01-17 MED ORDER — OXYCODONE-ACETAMINOPHEN 10-325 MG PO TABS: 1.0000 | ORAL_TABLET | Freq: Every day | ORAL | Status: DC

## 2015-01-19 ENCOUNTER — Encounter: Payer: Self-pay | Admitting: Orthopedic Surgery

## 2015-01-19 NOTE — Progress Notes (Signed)
Patient ID: [name], male   DOB: 10-Aug-1951, 64 y.o.   MRN: [id number]  Chief Complaint  Patient presents with  . Follow-up    follow up + xray Left proximal humerus fx, DOI 12/24/14    This is a fracture care follow-up visit for this very difficult and very cantankerous 64 year old male with a left proximal humerus fracture which we are treating in a fracture brace/fracture cuff. He had some wound issues at the distal end of the cuff which were treated with 3 sizing, oral antibiotics, dressing changes and that has subsequently resolved. This will require continuous padding however.  The x-ray today shows that the fracture is in excellent alignment we are getting very good opposition and correction of the deformity of the proximal humerus  We would like to continue with our bracing and have him follow-up for his subsequent x-rays in several weeks.  He did ask that his pain medication be made 5 tablets per day. I think this is okay since he came here with that regimen for his Percocet. He has a chronic pain syndrome as well as narcotic tolerance.

## 2015-01-22 ENCOUNTER — Other Ambulatory Visit (HOSPITAL_COMMUNITY): Payer: Medicaid Other

## 2015-01-25 ENCOUNTER — Ambulatory Visit: Admit: 2015-01-25 | Payer: Self-pay | Admitting: Internal Medicine

## 2015-01-25 SURGERY — COLONOSCOPY WITH PROPOFOL
Anesthesia: Monitor Anesthesia Care

## 2015-02-11 ENCOUNTER — Encounter: Payer: Self-pay | Admitting: Physician Assistant

## 2015-02-11 ENCOUNTER — Ambulatory Visit (INDEPENDENT_AMBULATORY_CARE_PROVIDER_SITE_OTHER): Payer: Medicaid Other | Admitting: Physician Assistant

## 2015-02-11 VITALS — BP 104/76 | HR 69 | Ht 67.25 in | Wt 302.3 lb

## 2015-02-11 DIAGNOSIS — E785 Hyperlipidemia, unspecified: Secondary | ICD-10-CM

## 2015-02-11 DIAGNOSIS — I2510 Atherosclerotic heart disease of native coronary artery without angina pectoris: Secondary | ICD-10-CM

## 2015-02-11 DIAGNOSIS — Z720 Tobacco use: Secondary | ICD-10-CM

## 2015-02-11 DIAGNOSIS — I10 Essential (primary) hypertension: Secondary | ICD-10-CM

## 2015-02-11 DIAGNOSIS — Z9989 Dependence on other enabling machines and devices: Secondary | ICD-10-CM

## 2015-02-11 DIAGNOSIS — G4733 Obstructive sleep apnea (adult) (pediatric): Secondary | ICD-10-CM

## 2015-02-11 DIAGNOSIS — R600 Localized edema: Secondary | ICD-10-CM | POA: Insufficient documentation

## 2015-02-11 DIAGNOSIS — R0602 Shortness of breath: Secondary | ICD-10-CM | POA: Diagnosis not present

## 2015-02-11 DIAGNOSIS — Z79899 Other long term (current) drug therapy: Secondary | ICD-10-CM

## 2015-02-11 DIAGNOSIS — F17200 Nicotine dependence, unspecified, uncomplicated: Secondary | ICD-10-CM

## 2015-02-11 DIAGNOSIS — I4892 Unspecified atrial flutter: Secondary | ICD-10-CM

## 2015-02-11 LAB — BASIC METABOLIC PANEL
BUN: 14 mg/dL (ref 6–23)
CO2: 27 mEq/L (ref 19–32)
Calcium: 8.9 mg/dL (ref 8.4–10.5)
Chloride: 102 mEq/L (ref 96–112)
Creat: 0.96 mg/dL (ref 0.50–1.35)
Glucose, Bld: 151 mg/dL — ABNORMAL HIGH (ref 70–99)
Potassium: 5.1 mEq/L (ref 3.5–5.3)
SODIUM: 140 meq/L (ref 135–145)

## 2015-02-11 NOTE — Assessment & Plan Note (Signed)
This is probably related to diastolic heart failure. The patient does not appear to be diet compliant.   According to the weights in the computer has gained 12 pounds. Is considerable lower extremity edema and has not been taking his Lasix twice daily as indicated in the med list. I've asked  Him to start taking it twice daily. As well as weight himself every day and reduce his salt intake. We'll check a basic metabolic panel today and he'll follow-up in 2 weeks. His left lower extremity has  A slight pink tint to it he may be developing cellulitis. Follow-up Next visit

## 2015-02-11 NOTE — Assessment & Plan Note (Signed)
No complaints of angina. Aspirin and metoprolol

## 2015-02-11 NOTE — Assessment & Plan Note (Signed)
On a statin 

## 2015-02-11 NOTE — Assessment & Plan Note (Addendum)
Maintaining sinus rhythm with first-degree AV lock. Rate 69 bpm.    continue amiodarone
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
## 2015-02-11 NOTE — Patient Instructions (Signed)
INCREASE Furosemide to 40mg  twice a day.  Your physician recommends that you return for lab work in: Today at [facility].  Your physician recommends that you schedule a follow-up appointment in: 2-3 weeks with NP/PA.  Keep your feet elevated when sitting.    Your physician recommends that you weigh, daily, at the same time every day, and in the same amount of clothing. Please record your daily weights on the handout provided and bring it to your next appointment.   Low-Sodium Eating Plan Sodium raises blood pressure and causes water to be held in the body. Getting less sodium from food will help lower your blood pressure, reduce any swelling, and protect your heart, liver, and kidneys. We get sodium by adding salt (sodium chloride) to food. Most of our sodium comes from canned, boxed, and frozen foods. Restaurant foods, fast foods, and pizza are also very high in sodium. Even if you take medicine to lower your blood pressure or to reduce fluid in your body, getting less sodium from your food is important. WHAT IS MY PLAN? Most people should limit their sodium intake to 2,300 mg a day. Your health care provider recommends that you limit your sodium intake to ____1800______ a day.  WHAT DO I NEED TO KNOW ABOUT THIS EATING PLAN? For the low-sodium eating plan, you will follow these general guidelines:  Choose foods with a % Daily Value for sodium of less than 5% (as listed on the food label).   Use salt-free seasonings or herbs instead of table salt or sea salt.   Check with your health care provider or pharmacist before using salt substitutes.   Eat fresh foods.  Eat more vegetables and fruits.  Limit canned vegetables. If you do use them, rinse them well to decrease the sodium.   Limit cheese to 1 oz (28 g) per day.   Eat lower-sodium products, often labeled as "lower sodium" or "no salt added."  Avoid foods that contain monosodium glutamate (MSG). MSG is sometimes added to  [place] food and some canned foods.  Check food labels (Nutrition Facts labels) on foods to learn how much sodium is in one serving.  Eat more home-cooked food and less restaurant, buffet, and fast food.  When eating at a restaurant, ask that your food be prepared with less salt or none, if possible.  HOW DO I READ FOOD LABELS FOR SODIUM INFORMATION? The Nutrition Facts label lists the amount of sodium in one serving of the food. If you eat more than one serving, you must multiply the listed amount of sodium by the number of servings. Food labels may also identify foods as:  Sodium free--Less than 5 mg in a serving.  Very low sodium--35 mg or less in a serving.  Low sodium--140 mg or less in a serving.  Light in sodium--50% less sodium in a serving. For example, if a food that usually has 300 mg of sodium is changed to become light in sodium, it will have 150 mg of sodium.  Reduced sodium--25% less sodium in a serving. For example, if a food that usually has 400 mg of sodium is changed to reduced sodium, it will have 300 mg of sodium. WHAT FOODS CAN I EAT? Grains Low-sodium cereals, including oats, puffed wheat and rice, and shredded wheat cereals. Low-sodium crackers. Unsalted rice and pasta. Lower-sodium bread.  Vegetables Frozen or fresh vegetables. Low-sodium or reduced-sodium canned vegetables. Low-sodium or reduced-sodium tomato sauce and paste. Low-sodium or reduced-sodium tomato and vegetable juices.  Fruits Fresh, frozen, and canned fruit. Fruit juice.  Meat and Other Protein Products Low-sodium canned tuna and salmon. Fresh or frozen meat, poultry, seafood, and fish. Lamb. Unsalted nuts. Dried beans, peas, and lentils without added salt. Unsalted canned beans. Homemade soups without salt. Eggs.  Dairy Milk. Soy milk. Ricotta cheese. Low-sodium or reduced-sodium cheeses. Yogurt.  Condiments Fresh and dried herbs and spices. Salt-free seasonings. Onion and garlic  powders. Low-sodium varieties of mustard and ketchup. Lemon juice.  Fats and Oils Reduced-sodium salad dressings. Unsalted butter.  Other Unsalted popcorn and pretzels.  The items listed above may not be a complete list of recommended foods or beverages. Contact your dietitian for more options. WHAT FOODS ARE NOT RECOMMENDED? Grains Instant hot cereals. Bread stuffing, pancake, and biscuit mixes. Croutons. Seasoned rice or pasta mixes. Noodle soup cups. Boxed or frozen macaroni and cheese. Self-rising flour. Regular salted crackers. Vegetables Regular canned vegetables. Regular canned tomato sauce and paste. Regular tomato and vegetable juices. Frozen vegetables in sauces. Salted french fries. Olives. Angie Fava. Relishes. Sauerkraut. Salsa. Meat and Other Protein Products Salted, canned, smoked, spiced, or pickled meats, seafood, or fish. Bacon, ham, sausage, hot dogs, corned beef, chipped beef, and packaged luncheon meats. Salt pork. Jerky. Pickled herring. Anchovies, regular canned tuna, and sardines. Salted nuts. Dairy Processed cheese and cheese spreads. Cheese curds. Blue cheese and cottage cheese. Buttermilk.  Condiments Onion and garlic salt, seasoned salt, table salt, and sea salt. Canned and packaged gravies. Worcestershire sauce. Tartar sauce. Barbecue sauce. Teriyaki sauce. Soy sauce, including reduced sodium. Steak sauce. Fish sauce. Oyster sauce. Cocktail sauce. Horseradish. Regular ketchup and mustard. Meat flavorings and tenderizers. Bouillon cubes. Hot sauce. Tabasco sauce. Marinades. Taco seasonings. Relishes. Fats and Oils Regular salad dressings. Salted butter. Margarine. Ghee. Bacon fat.  Other Potato and tortilla chips. Corn chips and puffs. Salted popcorn and pretzels. Canned or dried soups. Pizza. Frozen entrees and pot pies.  The items listed above may not be a complete list of foods and beverages to avoid. Contact your dietitian for more information. Document  Released: 03/20/2002 Document Revised: 10/03/2013 Document Reviewed: 08/02/2013 [facility] Patient Information 2015 [place], [place]. This information is not intended to replace advice given to you by your health care provider. Make sure you discuss any questions you have with your health care provider.

## 2015-02-11 NOTE — Assessment & Plan Note (Signed)
Blood pressure is just a little bit on the low side. changes

## 2015-02-11 NOTE — Assessment & Plan Note (Signed)
Continues to smoke. Cessation advised.

## 2015-02-12 ENCOUNTER — Telehealth: Payer: Self-pay | Admitting: Cardiology

## 2015-02-12 MED ORDER — AMIODARONE HCL 200 MG PO TABS: 200.0000 mg | ORAL_TABLET | Freq: Every day | ORAL | Status: DC

## 2015-02-12 NOTE — Telephone Encounter (Signed)
Spoke with pt wife, aware meds sent to the pharmacy electronically

## 2015-02-12 NOTE — Telephone Encounter (Signed)
New message         Pt was on antibiotic Cephalexin 500mg  twice a day and started it January 03, 2015 and ended it on January 16, 2015    Pt wants to know if a new prescription for Amiodarone 200 mg can be sent to [facility] in [place].

## 2015-02-18 ENCOUNTER — Other Ambulatory Visit: Payer: Self-pay | Admitting: Orthopedic Surgery

## 2015-02-18 ENCOUNTER — Ambulatory Visit (HOSPITAL_COMMUNITY)
Admission: RE | Admit: 2015-02-18 | Discharge: 2015-02-18 | Disposition: A | Discharge status: Home / Self Care | Payer: Medicaid Other | Source: Ambulatory Visit | Attending: Orthopedic Surgery | Admitting: Orthopedic Surgery

## 2015-02-18 DIAGNOSIS — S43002D Unspecified subluxation of left shoulder joint, subsequent encounter: Secondary | ICD-10-CM | POA: Insufficient documentation

## 2015-02-18 DIAGNOSIS — X58XXXD Exposure to other specified factors, subsequent encounter: Secondary | ICD-10-CM | POA: Insufficient documentation

## 2015-02-18 DIAGNOSIS — S42202G Unspecified fracture of upper end of left humerus, subsequent encounter for fracture with delayed healing: Secondary | ICD-10-CM | POA: Insufficient documentation

## 2015-02-18 DIAGNOSIS — T148XXA Other injury of unspecified body region, initial encounter: Secondary | ICD-10-CM

## 2015-02-19 ENCOUNTER — Ambulatory Visit (INDEPENDENT_AMBULATORY_CARE_PROVIDER_SITE_OTHER): Payer: Self-pay | Admitting: Orthopedic Surgery

## 2015-02-19 ENCOUNTER — Encounter: Payer: Self-pay | Admitting: Vascular Surgery

## 2015-02-19 VITALS — BP 102/62 | Ht 67.25 in | Wt 302.3 lb

## 2015-02-19 DIAGNOSIS — S42202D Unspecified fracture of upper end of left humerus, subsequent encounter for fracture with routine healing: Secondary | ICD-10-CM

## 2015-02-19 MED ORDER — OXYCODONE-ACETAMINOPHEN 10-325 MG PO TABS: 1.0000 | ORAL_TABLET | ORAL | Status: DC | PRN

## 2015-02-19 NOTE — Progress Notes (Signed)
Patient ID: [name], male   DOB: 06-08-1951, 64 y.o.   MRN: [id number] Chief Complaint  Patient presents with  . Follow-up    4 week follow up Left humerus fx, DOI 12/24/14    Follow-up proximal humerus fracture left treated with fracture brace. Today's x-rays were done at [facility] and they sort of come off his AP x-ray both lateral x-ray shows excellent alignment and I'm fairly confident that nothing has changed and even if it had at this point were given except what we got his wound healed nicely over the elbow. When he comes back on June 14 for his 12 week film will take a humerus x-ray  We refilled his Percocet No. 150/ 10 mg up to 5 tablets daily

## 2015-02-20 ENCOUNTER — Emergency Department (HOSPITAL_COMMUNITY)
Admission: EM | Admit: 2015-02-20 | Discharge: 2015-02-20 | Disposition: A | Discharge status: Home / Self Care | Payer: Medicaid Other | Attending: Emergency Medicine | Admitting: Emergency Medicine

## 2015-02-20 ENCOUNTER — Encounter: Payer: Medicaid Other | Admitting: Vascular Surgery

## 2015-02-20 ENCOUNTER — Encounter (HOSPITAL_COMMUNITY): Payer: Self-pay | Admitting: Emergency Medicine

## 2015-02-20 ENCOUNTER — Emergency Department (HOSPITAL_COMMUNITY): Payer: Medicaid Other

## 2015-02-20 DIAGNOSIS — I10 Essential (primary) hypertension: Secondary | ICD-10-CM | POA: Diagnosis not present

## 2015-02-20 DIAGNOSIS — Z794 Long term (current) use of insulin: Secondary | ICD-10-CM | POA: Insufficient documentation

## 2015-02-20 DIAGNOSIS — M25561 Pain in right knee: Secondary | ICD-10-CM

## 2015-02-20 DIAGNOSIS — Z7951 Long term (current) use of inhaled steroids: Secondary | ICD-10-CM | POA: Diagnosis not present

## 2015-02-20 DIAGNOSIS — Y9289 Other specified places as the place of occurrence of the external cause: Secondary | ICD-10-CM | POA: Diagnosis not present

## 2015-02-20 DIAGNOSIS — Z7982 Long term (current) use of aspirin: Secondary | ICD-10-CM | POA: Diagnosis not present

## 2015-02-20 DIAGNOSIS — L97519 Non-pressure chronic ulcer of other part of right foot with unspecified severity: Secondary | ICD-10-CM

## 2015-02-20 DIAGNOSIS — S8991XA Unspecified injury of right lower leg, initial encounter: Secondary | ICD-10-CM | POA: Diagnosis not present

## 2015-02-20 DIAGNOSIS — Y9389 Activity, other specified: Secondary | ICD-10-CM | POA: Insufficient documentation

## 2015-02-20 DIAGNOSIS — Z8673 Personal history of transient ischemic attack (TIA), and cerebral infarction without residual deficits: Secondary | ICD-10-CM | POA: Insufficient documentation

## 2015-02-20 DIAGNOSIS — Z79899 Other long term (current) drug therapy: Secondary | ICD-10-CM | POA: Insufficient documentation

## 2015-02-20 DIAGNOSIS — I252 Old myocardial infarction: Secondary | ICD-10-CM | POA: Diagnosis not present

## 2015-02-20 DIAGNOSIS — G8929 Other chronic pain: Secondary | ICD-10-CM | POA: Diagnosis not present

## 2015-02-20 DIAGNOSIS — W19XXXA Unspecified fall, initial encounter: Secondary | ICD-10-CM

## 2015-02-20 DIAGNOSIS — I2510 Atherosclerotic heart disease of native coronary artery without angina pectoris: Secondary | ICD-10-CM | POA: Diagnosis not present

## 2015-02-20 DIAGNOSIS — L97419 Non-pressure chronic ulcer of right heel and midfoot with unspecified severity: Secondary | ICD-10-CM | POA: Diagnosis not present

## 2015-02-20 DIAGNOSIS — Y998 Other external cause status: Secondary | ICD-10-CM | POA: Diagnosis not present

## 2015-02-20 DIAGNOSIS — M25562 Pain in left knee: Secondary | ICD-10-CM

## 2015-02-20 DIAGNOSIS — Z87442 Personal history of urinary calculi: Secondary | ICD-10-CM | POA: Insufficient documentation

## 2015-02-20 DIAGNOSIS — W050XXA Fall from non-moving wheelchair, initial encounter: Secondary | ICD-10-CM | POA: Insufficient documentation

## 2015-02-20 DIAGNOSIS — E119 Type 2 diabetes mellitus without complications: Secondary | ICD-10-CM | POA: Diagnosis not present

## 2015-02-20 DIAGNOSIS — Z720 Tobacco use: Secondary | ICD-10-CM | POA: Insufficient documentation

## 2015-02-20 DIAGNOSIS — S80212A Abrasion, left knee, initial encounter: Secondary | ICD-10-CM | POA: Diagnosis not present

## 2015-02-20 DIAGNOSIS — S42292D Other displaced fracture of upper end of left humerus, subsequent encounter for fracture with routine healing: Secondary | ICD-10-CM | POA: Insufficient documentation

## 2015-02-20 DIAGNOSIS — S8992XA Unspecified injury of left lower leg, initial encounter: Secondary | ICD-10-CM | POA: Diagnosis present

## 2015-02-20 DIAGNOSIS — S42302D Unspecified fracture of shaft of humerus, left arm, subsequent encounter for fracture with routine healing: Secondary | ICD-10-CM

## 2015-02-20 MED ORDER — OXYCODONE-ACETAMINOPHEN 5-325 MG PO TABS
2.0000 | ORAL_TABLET | Freq: Once | ORAL | Status: AC
Start: 2015-02-20 — End: 2015-02-20
  Administered 2015-02-20: 2 via ORAL
  Filled 2015-02-20: qty 2
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
## 2015-02-20 NOTE — Discharge Instructions (Signed)
Follow-up with vascular and orthopedics as scheduled. If the knees do not give better over a few days that Dr. Aline Brochure from orthopedics evaluate them further. Today's x-rays without any bony abnormalities or fractures. The left humerus fracture is unchanged and healing appropriately. No evidence of any bony injuries to the knee. The right foot has no evidence of any fractures or any infection in the bone.

## 2015-02-20 NOTE — ED Notes (Signed)
Discharged instructions reviewed. States understanding. Assisted pt in wheelchair

## 2015-02-20 NOTE — ED Notes (Signed)
Pt states he was getting into a wheelchair [employer]i to go to the doctor today and fell.  [address] [address] on already broken left shoulder and is also c/o bilateral knee pain and bilateral foot pain.

## 2015-03-05 ENCOUNTER — Ambulatory Visit (INDEPENDENT_AMBULATORY_CARE_PROVIDER_SITE_OTHER): Payer: Medicaid Other | Admitting: Cardiology

## 2015-03-05 ENCOUNTER — Encounter: Payer: Self-pay | Admitting: Cardiology

## 2015-03-05 VITALS — BP 98/74 | HR 65 | Ht 67.0 in | Wt 302.5 lb

## 2015-03-05 DIAGNOSIS — I2510 Atherosclerotic heart disease of native coronary artery without angina pectoris: Secondary | ICD-10-CM

## 2015-03-05 DIAGNOSIS — L03116 Cellulitis of left lower limb: Secondary | ICD-10-CM | POA: Diagnosis not present

## 2015-03-05 DIAGNOSIS — I10 Essential (primary) hypertension: Secondary | ICD-10-CM

## 2015-03-05 DIAGNOSIS — R600 Localized edema: Secondary | ICD-10-CM

## 2015-03-05 DIAGNOSIS — E875 Hyperkalemia: Secondary | ICD-10-CM | POA: Diagnosis not present

## 2015-03-05 LAB — BASIC METABOLIC PANEL
BUN: 19 mg/dL (ref 6–23)
CALCIUM: 8.7 mg/dL (ref 8.4–10.5)
CO2: 28 mEq/L (ref 19–32)
Chloride: 99 mEq/L (ref 96–112)
Creat: 0.91 mg/dL (ref 0.50–1.35)
GLUCOSE: 198 mg/dL — AB (ref 70–99)
Potassium: 4.6 mEq/L (ref 3.5–5.3)
SODIUM: 138 meq/L (ref 135–145)

## 2015-03-05 MED ORDER — CEPHALEXIN 500 MG PO CAPS: 500.0000 mg | ORAL_CAPSULE | Freq: Two times a day (BID) | ORAL | Status: DC
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
## 2015-03-05 NOTE — Patient Instructions (Signed)
Take keflex 500 mg 1 tablet twice a day for 7 days  Lab today --BMP  CONTINUE TAKING LASIX ( FUROSEMIDE) AS WELL AS YOUR OTHER MEDICATIONS.  Your physician recommends that you schedule a follow-up appointment in 2-3 MONTHS  (NEXT AVAILABLE ) Dr Stanford Breed.

## 2015-03-06 ENCOUNTER — Telehealth: Payer: Self-pay | Admitting: *Deleted

## 2015-03-06 NOTE — Telephone Encounter (Signed)
-----   Message from Isaiah Serge, NP sent at 03/06/2015  9:04 AM EDT ----- Labs good.

## 2015-03-06 NOTE — Telephone Encounter (Signed)
Left message lab was good Any question call back

## 2015-03-07 ENCOUNTER — Telehealth: Payer: Self-pay | Admitting: Cardiology

## 2015-03-07 NOTE — Telephone Encounter (Signed)
Please call back,concerning his lab results.

## 2015-03-07 NOTE — Telephone Encounter (Signed)
Results reported to patient, he verbalized understanding of instructions. He is aware to take the Keflex and return call if results unimproved.

## 2015-03-20 ENCOUNTER — Emergency Department (HOSPITAL_COMMUNITY): Payer: Medicaid Other

## 2015-03-20 ENCOUNTER — Encounter (HOSPITAL_COMMUNITY): Payer: Self-pay

## 2015-03-20 ENCOUNTER — Emergency Department (HOSPITAL_COMMUNITY)
Admission: EM | Admit: 2015-03-20 | Discharge: 2015-03-20 | Disposition: A | Discharge status: Home / Self Care | Payer: Medicaid Other | Attending: Emergency Medicine | Admitting: Emergency Medicine

## 2015-03-20 DIAGNOSIS — G8929 Other chronic pain: Secondary | ICD-10-CM | POA: Insufficient documentation

## 2015-03-20 DIAGNOSIS — Z79899 Other long term (current) drug therapy: Secondary | ICD-10-CM | POA: Diagnosis not present

## 2015-03-20 DIAGNOSIS — Z7951 Long term (current) use of inhaled steroids: Secondary | ICD-10-CM | POA: Diagnosis not present

## 2015-03-20 DIAGNOSIS — Z7982 Long term (current) use of aspirin: Secondary | ICD-10-CM | POA: Insufficient documentation

## 2015-03-20 DIAGNOSIS — I10 Essential (primary) hypertension: Secondary | ICD-10-CM | POA: Insufficient documentation

## 2015-03-20 DIAGNOSIS — I2510 Atherosclerotic heart disease of native coronary artery without angina pectoris: Secondary | ICD-10-CM | POA: Insufficient documentation

## 2015-03-20 DIAGNOSIS — Z8781 Personal history of (healed) traumatic fracture: Secondary | ICD-10-CM | POA: Diagnosis not present

## 2015-03-20 DIAGNOSIS — E119 Type 2 diabetes mellitus without complications: Secondary | ICD-10-CM | POA: Insufficient documentation

## 2015-03-20 DIAGNOSIS — Z8673 Personal history of transient ischemic attack (TIA), and cerebral infarction without residual deficits: Secondary | ICD-10-CM | POA: Insufficient documentation

## 2015-03-20 DIAGNOSIS — Z87442 Personal history of urinary calculi: Secondary | ICD-10-CM | POA: Diagnosis not present

## 2015-03-20 DIAGNOSIS — G629 Polyneuropathy, unspecified: Secondary | ICD-10-CM | POA: Insufficient documentation

## 2015-03-20 DIAGNOSIS — M79604 Pain in right leg: Secondary | ICD-10-CM | POA: Diagnosis present

## 2015-03-20 DIAGNOSIS — I739 Peripheral vascular disease, unspecified: Secondary | ICD-10-CM

## 2015-03-20 DIAGNOSIS — M79605 Pain in left leg: Secondary | ICD-10-CM

## 2015-03-20 DIAGNOSIS — Z720 Tobacco use: Secondary | ICD-10-CM | POA: Diagnosis not present

## 2015-03-20 DIAGNOSIS — I779 Disorder of arteries and arterioles, unspecified: Secondary | ICD-10-CM | POA: Diagnosis not present

## 2015-03-20 DIAGNOSIS — E780 Pure hypercholesterolemia: Secondary | ICD-10-CM | POA: Insufficient documentation

## 2015-03-20 DIAGNOSIS — Z794 Long term (current) use of insulin: Secondary | ICD-10-CM | POA: Diagnosis not present

## 2015-03-20 DIAGNOSIS — I252 Old myocardial infarction: Secondary | ICD-10-CM | POA: Diagnosis not present

## 2015-03-20 LAB — BASIC METABOLIC PANEL
Anion gap: 11 (ref 5–15)
BUN: 17 mg/dL (ref 6–20)
CHLORIDE: 99 mmol/L — AB (ref 101–111)
CO2: 32 mmol/L (ref 22–32)
Calcium: 8.6 mg/dL — ABNORMAL LOW (ref 8.9–10.3)
Creatinine, Ser: 0.95 mg/dL (ref 0.61–1.24)
GFR calc non Af Amer: >60 mL/min (ref >60)
GLUCOSE: 119 mg/dL — AB (ref 65–99)
Potassium: 4.5 mmol/L (ref 3.5–5.1)
Sodium: 142 mmol/L (ref 135–145)

## 2015-03-20 LAB — CBC WITH DIFFERENTIAL/PLATELET
Basophils Absolute: 0.1 10*3/uL (ref 0.0–0.1)
Basophils Relative: 1 % (ref 0–1)
Eosinophils Absolute: 0.3 10*3/uL (ref 0.0–0.7)
Eosinophils Relative: 3 % (ref 0–5)
HEMATOCRIT: 37.5 % — AB (ref 39.0–52.0)
Hemoglobin: 11.8 g/dL — ABNORMAL LOW (ref 13.0–17.0)
Lymphocytes Relative: 26 % (ref 12–46)
Lymphs Abs: 2.5 10*3/uL (ref 0.7–4.0)
MCH: 31 pg (ref 26.0–34.0)
MCHC: 31.5 g/dL (ref 30.0–36.0)
MCV: 98.4 fL (ref 78.0–100.0)
MONOS PCT: 7 % (ref 3–12)
Monocytes Absolute: 0.7 10*3/uL (ref 0.1–1.0)
Neutro Abs: 6.3 10*3/uL (ref 1.7–7.7)
Neutrophils Relative %: 63 % (ref 43–77)
Platelets: 183 10*3/uL (ref 150–400)
RBC: 3.81 MIL/uL — ABNORMAL LOW (ref 4.22–5.81)
RDW: 15.1 % (ref 11.5–15.5)
WBC: 10 10*3/uL (ref 4.0–10.5)

## 2015-03-20 MED ORDER — HYDROCODONE-ACETAMINOPHEN 5-325 MG PO TABS: 1.0000 | ORAL_TABLET | ORAL | Status: DC | PRN

## 2015-03-20 NOTE — ED Notes (Signed)
Patient transported to CT 

## 2015-03-20 NOTE — ED Notes (Signed)
Pt complain of pain in both legs and feet. Also, states he has fallen today. Pt has splint to left arm., states it is broken but has not had surgery. Pt has scabbed over sore to right great toe and left second toe. Tip of left toe is black.

## 2015-03-20 NOTE — ED Notes (Signed)
Patient verbalizes understanding of discharge instructions, home care and follow up care. Patient out of department at this time with spouse
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
## 2015-03-20 NOTE — Discharge Instructions (Signed)

## 2015-03-21 ENCOUNTER — Telehealth: Payer: Self-pay | Admitting: Orthopedic Surgery

## 2015-03-21 ENCOUNTER — Other Ambulatory Visit: Payer: Self-pay | Admitting: *Deleted

## 2015-03-21 MED ORDER — OXYCODONE-ACETAMINOPHEN 10-325 MG PO TABS: 1.0000 | ORAL_TABLET | ORAL | Status: DC | PRN

## 2015-03-21 NOTE — Telephone Encounter (Signed)
Patients wife [name]) is calling requesting a refill on [facility] medication HYDROcodone-acetaminophen (NORCO/VICODIN) 5-325 MG per tablet please advise?

## 2015-03-21 NOTE — Telephone Encounter (Signed)
Prescription available, patient aware  

## 2015-03-21 NOTE — Telephone Encounter (Signed)
Patient picked up Rx

## 2015-03-25 ENCOUNTER — Ambulatory Visit (HOSPITAL_COMMUNITY)
Admission: RE | Admit: 2015-03-25 | Discharge: 2015-03-25 | Disposition: A | Discharge status: Home / Self Care | Payer: Medicaid Other | Source: Ambulatory Visit | Attending: Orthopedic Surgery | Admitting: Orthopedic Surgery

## 2015-03-25 ENCOUNTER — Other Ambulatory Visit: Payer: Self-pay | Admitting: Orthopedic Surgery

## 2015-03-25 DIAGNOSIS — X58XXXD Exposure to other specified factors, subsequent encounter: Secondary | ICD-10-CM | POA: Insufficient documentation

## 2015-03-25 DIAGNOSIS — S42202D Unspecified fracture of upper end of left humerus, subsequent encounter for fracture with routine healing: Secondary | ICD-10-CM | POA: Diagnosis present

## 2015-03-25 DIAGNOSIS — T148XXA Other injury of unspecified body region, initial encounter: Secondary | ICD-10-CM

## 2015-03-26 ENCOUNTER — Encounter: Payer: Self-pay | Admitting: Orthopedic Surgery

## 2015-03-26 ENCOUNTER — Ambulatory Visit (INDEPENDENT_AMBULATORY_CARE_PROVIDER_SITE_OTHER): Payer: Self-pay | Admitting: Orthopedic Surgery

## 2015-03-26 VITALS — BP 105/56 | Ht 67.0 in | Wt 290.0 lb

## 2015-03-26 DIAGNOSIS — S42202D Unspecified fracture of upper end of left humerus, subsequent encounter for fracture with routine healing: Secondary | ICD-10-CM

## 2015-03-26 NOTE — Progress Notes (Signed)
Patient ID: [name], male   DOB: 1951-09-27, 64 y.o.   MRN: [id number] Follow-up visit  Proximal humerus fracture treated with fracture brace secondary to patient's severe medical problems and chronic pain  He is in a humeral fracture cuff  The x-ray was done at the hospital to facilitate that her imaging. X-ray was done June 13. X-ray shows very minimal angulation on AP and lateral x-rays and progression towards healing.  His clinical exam shows no motion at the fracture site he has some mild tenderness and therefore we can keep his fracture brace on for 1 more month. X-ray in a month at the hospital then follow-up here in the office  We checked his skin it looked fine to give him a new fracture sleeve and reapplied his cuff with appropriate padding at the elbow

## 2015-03-29 ENCOUNTER — Encounter: Payer: Self-pay | Admitting: Vascular Surgery

## 2015-04-03 ENCOUNTER — Ambulatory Visit (INDEPENDENT_AMBULATORY_CARE_PROVIDER_SITE_OTHER): Payer: Medicaid Other | Admitting: Vascular Surgery

## 2015-04-03 ENCOUNTER — Other Ambulatory Visit: Payer: Self-pay

## 2015-04-03 ENCOUNTER — Encounter: Payer: Self-pay | Admitting: Vascular Surgery

## 2015-04-03 VITALS — BP 114/56 | HR 69 | Ht 67.0 in | Wt 290.0 lb

## 2015-04-03 DIAGNOSIS — I70299 Other atherosclerosis of native arteries of extremities, unspecified extremity: Secondary | ICD-10-CM | POA: Diagnosis not present

## 2015-04-03 DIAGNOSIS — L97909 Non-pressure chronic ulcer of unspecified part of unspecified lower leg with unspecified severity: Secondary | ICD-10-CM

## 2015-04-03 DIAGNOSIS — Z01810 Encounter for preprocedural cardiovascular examination: Secondary | ICD-10-CM | POA: Diagnosis not present

## 2015-04-03 NOTE — Addendum Note (Signed)
Addended by: Dorthula Rue L on: 04/03/2015 02:57 PM   Modules accepted: Orders
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
## 2015-04-08 ENCOUNTER — Other Ambulatory Visit: Payer: Self-pay

## 2015-04-08 ENCOUNTER — Encounter (HOSPITAL_COMMUNITY)
Admission: RE | Disposition: A | Discharge status: Home / Self Care | Payer: Medicaid Other | Source: Ambulatory Visit | Attending: Vascular Surgery

## 2015-04-08 ENCOUNTER — Ambulatory Visit (HOSPITAL_COMMUNITY)
Admission: RE | Admit: 2015-04-08 | Discharge: 2015-04-08 | Disposition: A | Discharge status: Home / Self Care | Payer: Medicaid Other | Source: Ambulatory Visit | Attending: Vascular Surgery | Admitting: Vascular Surgery

## 2015-04-08 DIAGNOSIS — I2510 Atherosclerotic heart disease of native coronary artery without angina pectoris: Secondary | ICD-10-CM | POA: Diagnosis not present

## 2015-04-08 DIAGNOSIS — Z6841 Body Mass Index (BMI) 40.0 and over, adult: Secondary | ICD-10-CM | POA: Diagnosis not present

## 2015-04-08 DIAGNOSIS — G4733 Obstructive sleep apnea (adult) (pediatric): Secondary | ICD-10-CM | POA: Diagnosis not present

## 2015-04-08 DIAGNOSIS — E119 Type 2 diabetes mellitus without complications: Secondary | ICD-10-CM | POA: Insufficient documentation

## 2015-04-08 DIAGNOSIS — E785 Hyperlipidemia, unspecified: Secondary | ICD-10-CM | POA: Diagnosis not present

## 2015-04-08 DIAGNOSIS — L97909 Non-pressure chronic ulcer of unspecified part of unspecified lower leg with unspecified severity: Secondary | ICD-10-CM

## 2015-04-08 DIAGNOSIS — L97519 Non-pressure chronic ulcer of other part of right foot with unspecified severity: Secondary | ICD-10-CM | POA: Diagnosis not present

## 2015-04-08 DIAGNOSIS — F17210 Nicotine dependence, cigarettes, uncomplicated: Secondary | ICD-10-CM | POA: Insufficient documentation

## 2015-04-08 DIAGNOSIS — I70299 Other atherosclerosis of native arteries of extremities, unspecified extremity: Secondary | ICD-10-CM

## 2015-04-08 DIAGNOSIS — I70235 Atherosclerosis of native arteries of right leg with ulceration of other part of foot: Secondary | ICD-10-CM | POA: Insufficient documentation

## 2015-04-08 DIAGNOSIS — Z8673 Personal history of transient ischemic attack (TIA), and cerebral infarction without residual deficits: Secondary | ICD-10-CM | POA: Diagnosis not present

## 2015-04-08 DIAGNOSIS — Z8249 Family history of ischemic heart disease and other diseases of the circulatory system: Secondary | ICD-10-CM | POA: Insufficient documentation

## 2015-04-08 DIAGNOSIS — I252 Old myocardial infarction: Secondary | ICD-10-CM | POA: Insufficient documentation

## 2015-04-08 HISTORY — PX: PERIPHERAL VASCULAR CATHETERIZATION: SHX172C

## 2015-04-08 LAB — POCT I-STAT, CHEM 8
BUN: 20 mg/dL (ref 6–20)
Calcium, Ion: 1.22 mmol/L (ref 1.13–1.30)
Chloride: 104 mmol/L (ref 101–111)
Creatinine, Ser: 1.1 mg/dL (ref 0.61–1.24)
Glucose, Bld: 151 mg/dL — ABNORMAL HIGH (ref 65–99)
HCT: 40 % (ref 39.0–52.0)
Hemoglobin: 13.6 g/dL (ref 13.0–17.0)
Potassium: 4.5 mmol/L (ref 3.5–5.1)
Sodium: 143 mmol/L (ref 135–145)
TCO2: 26 mmol/L (ref 0–100)

## 2015-04-08 LAB — GLUCOSE, CAPILLARY
Glucose-Capillary: 166 mg/dL — ABNORMAL HIGH (ref 65–99)
Glucose-Capillary: 92 mg/dL (ref 65–99)

## 2015-04-08 LAB — POCT ACTIVATED CLOTTING TIME
ACTIVATED CLOTTING TIME: 232 s
ACTIVATED CLOTTING TIME: 202 s
Activated Clotting Time: 171 seconds
Activated Clotting Time: 184 seconds

## 2015-04-08 SURGERY — ABDOMINAL AORTOGRAM
Laterality: Right

## 2015-04-08 MED ORDER — OXYCODONE-ACETAMINOPHEN 5-325 MG PO TABS
ORAL_TABLET | ORAL | Status: AC
  Filled 2015-04-08: qty 2

## 2015-04-08 MED ORDER — IODIXANOL 320 MG/ML IV SOLN
INTRAVENOUS | Status: DC | PRN
  Administered 2015-04-08: 165 mL via INTRAVENOUS

## 2015-04-08 MED ORDER — OXYCODONE-ACETAMINOPHEN 5-325 MG PO TABS
1.0000 | ORAL_TABLET | ORAL | Status: DC | PRN
  Administered 2015-04-08: 2 via ORAL

## 2015-04-08 MED ORDER — FENTANYL CITRATE (PF) 100 MCG/2ML IJ SOLN
INTRAMUSCULAR | Status: AC
  Filled 2015-04-08: qty 2

## 2015-04-08 MED ORDER — MIDAZOLAM HCL 2 MG/2ML IJ SOLN
INTRAMUSCULAR | Status: DC | PRN
  Administered 2015-04-08: 1 mg via INTRAVENOUS

## 2015-04-08 MED ORDER — MIDAZOLAM HCL 2 MG/2ML IJ SOLN
INTRAMUSCULAR | Status: AC
  Filled 2015-04-08: qty 2

## 2015-04-08 MED ORDER — CLOPIDOGREL BISULFATE 75 MG PO TABS
ORAL_TABLET | ORAL | Status: AC
  Filled 2015-04-08: qty 2

## 2015-04-08 MED ORDER — HEPARIN SODIUM (PORCINE) 1000 UNIT/ML IJ SOLN
INTRAMUSCULAR | Status: AC
  Filled 2015-04-08: qty 1

## 2015-04-08 MED ORDER — HEPARIN SODIUM (PORCINE) 1000 UNIT/ML IJ SOLN
INTRAMUSCULAR | Status: DC | PRN
  Administered 2015-04-08 (×2): 10000 [IU] via INTRAVENOUS

## 2015-04-08 MED ORDER — CLOPIDOGREL BISULFATE 75 MG PO TABS
ORAL_TABLET | ORAL | Status: DC | PRN
  Administered 2015-04-08: 150 mg via ORAL

## 2015-04-08 MED ORDER — CLOPIDOGREL BISULFATE 75 MG PO TABS: 75.0000 mg | ORAL_TABLET | Freq: Every day | ORAL | Status: DC

## 2015-04-08 MED ORDER — HEPARIN (PORCINE) IN NACL 2-0.9 UNIT/ML-% IJ SOLN
INTRAMUSCULAR | Status: AC
  Filled 2015-04-08: qty 1000

## 2015-04-08 MED ORDER — FENTANYL CITRATE (PF) 100 MCG/2ML IJ SOLN
INTRAMUSCULAR | Status: DC | PRN
  Administered 2015-04-08: 25 ug via INTRAVENOUS

## 2015-04-08 MED ORDER — LIDOCAINE HCL (PF) 1 % IJ SOLN
INTRAMUSCULAR | Status: AC
  Filled 2015-04-08: qty 30

## 2015-04-08 MED ORDER — SODIUM CHLORIDE 0.9 % IV SOLN
INTRAVENOUS | Status: DC
  Administered 2015-04-08: 08:00:00 via INTRAVENOUS

## 2015-04-08 MED ORDER — SODIUM CHLORIDE 0.9 % IV SOLN: 1.0000 mL/kg/h | INTRAVENOUS | Status: DC

## 2015-04-08 SURGICAL SUPPLY — 22 items
BALLN ARMADA 5X80X135 (BALLOONS) ×5
BALLOON ARMADA 5X80X135 (BALLOONS) ×1 IMPLANT
BALLOON POWERFLX PRO 4X80X135 (BALLOONS) ×2 IMPLANT
CATH ANGIO 5F PIGTAIL 65CM (CATHETERS) ×3 IMPLANT
CATH CROSS OVER TEMPO 5F (CATHETERS) ×2 IMPLANT
CATH INFINITI 5 FR STR PIGTAIL (CATHETERS) ×2 IMPLANT
CATH STRAIGHT 5FR 65CM (CATHETERS) ×4 IMPLANT
COVER PRB 48X5XTLSCP FOLD TPE (BAG) ×2 IMPLANT
COVER PROBE 5X48 (BAG) ×5
GUIDEWIRE ANGLED .035X150CM (WIRE) ×2 IMPLANT
HOVERMATT SINGLE USE (MISCELLANEOUS) ×3 IMPLANT
KIT ENCORE 26 ADVANTAGE (KITS) ×2 IMPLANT
KIT MICROINTRODUCER STIFF 5F (SHEATH) ×2 IMPLANT
KIT PV (KITS) ×5 IMPLANT
SHEATH PINNACLE 5F 10CM (SHEATH) ×2 IMPLANT
SHEATH PINNACLE ST 6F 45CM (SHEATH) ×3 IMPLANT
STENT ABSOLUTE PRO 6X80X135 (Permanent Stent) ×4 IMPLANT
SYR MEDRAD MARK V 150ML (SYRINGE) ×2 IMPLANT
TRANSDUCER W/STOPCOCK (MISCELLANEOUS) ×5 IMPLANT
TRAY PV CATH (CUSTOM PROCEDURE TRAY) ×5 IMPLANT
WIRE HITORQ VERSACORE ST 145CM (WIRE) ×2 IMPLANT
WIRE ROSEN-J .035X180CM (WIRE) ×2 IMPLANT

## 2015-04-08 NOTE — Interval H&P Note (Signed)
History and Physical Interval Note:  04/08/2015 9:59 AM  [name]  has presented today for surgery, with the diagnosis of pvd w/right toe ulcer  The various methods of treatment have been discussed with the patient and family. After consideration of risks, benefits and other options for treatment, the patient has consented to  Procedure(s): Abdominal Aortogram (N/A) as a surgical intervention .  The patient's history has been reviewed, patient examined, no change in status, stable for surgery.  I have reviewed the patient's chart and labs.  Questions were answered to the patient's satisfaction.     Deitra Mayo
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
## 2015-04-08 NOTE — Discharge Instructions (Addendum)
°  HOLD METFORMIN FOR 48 HOURS  Arteriogram Care After These instructions give you information on caring for yourself after your procedure. Your doctor may also give you more specific instructions. Call your doctor if you have any problems or questions after your procedure. HOME CARE  Do not bathe, swim, or use a hot tub until directed by your doctor. You can shower.  Do not lift anything heavier than 10 pounds (about a gallon of milk) for 2 days.  Do not walk a lot, run, or drive for 2 days.  Return to normal activities in 2 days or as told by your doctor. Finding out the results of your test Ask when your test results will be ready. Make sure you get your test results. GET HELP RIGHT AWAY IF:   You have fever.  You have more pain in your leg.  The leg that was cut is:  Bleeding.  Puffy (swollen) or red.  Cold.  Pale or changes color.  Weak.  Tingly or numb. If you go to the Emergency Room, tell your nurse that you have had an arteriogram. Take this paper with you to show the nurse. MAKE SURE YOU:  Understand these instructions.  Will watch your condition.  Will get help right away if you are not doing well or get worse. Document Released: 12/25/2008 Document Revised: 10/03/2013 Document Reviewed: 12/25/2008 [facility] Patient Information 2015 [place], [place]. This information is not intended to replace advice given to you by your health care provider. Make sure you discuss any questions you have with your health care provider.

## 2015-04-08 NOTE — Progress Notes (Signed)
Site area: Left groin a 6 french arterial sheath was removed  Site Prior to Removal:  Level 0  Pressure Applied For 20 MINUTES    Minutes Beginning at 1330p  Manual:   Yes.    Patient Status During Pull:  stable  Post Pull Groin Site:  Level 0  Post Pull Instructions Given:  Yes.    Post Pull Pulses Present:  Yes.    Dressing Applied:  Yes.    Comments:  VS remains stable during sheath pull.  Pt denies any discomfort at this time

## 2015-04-09 ENCOUNTER — Encounter (HOSPITAL_COMMUNITY): Payer: Self-pay | Admitting: Vascular Surgery

## 2015-04-09 MED FILL — Lidocaine HCl Local Preservative Free (PF) Inj 1%: INTRAMUSCULAR | Qty: 30 | Status: AC

## 2015-04-09 MED FILL — Heparin Sodium (Porcine) 2 Unit/ML in Sodium Chloride 0.9%: INTRAMUSCULAR | Qty: 1000 | Status: AC

## 2015-04-16 ENCOUNTER — Encounter: Payer: Self-pay | Admitting: Vascular Surgery

## 2015-04-18 ENCOUNTER — Other Ambulatory Visit: Payer: Self-pay | Admitting: *Deleted

## 2015-04-18 ENCOUNTER — Telehealth: Payer: Self-pay | Admitting: Orthopedic Surgery

## 2015-04-18 MED ORDER — OXYCODONE-ACETAMINOPHEN 10-325 MG PO TABS: 1.0000 | ORAL_TABLET | ORAL | Status: DC | PRN

## 2015-04-18 NOTE — Telephone Encounter (Signed)
Wife aware prescription is available for pick up, will pick up at 4:30

## 2015-04-18 NOTE — Telephone Encounter (Signed)
Patients wife is calling requesting a refill on [facility] Medication oxyCODONE-acetaminophen (PERCOCET) 10-325 MG per tablet please advise?

## 2015-04-22 NOTE — Telephone Encounter (Signed)
Patients wife picked up Rx. 

## 2015-04-24 ENCOUNTER — Other Ambulatory Visit: Payer: Self-pay | Admitting: Orthopedic Surgery

## 2015-04-24 ENCOUNTER — Ambulatory Visit (HOSPITAL_COMMUNITY)
Admission: RE | Admit: 2015-04-24 | Discharge: 2015-04-24 | Disposition: A | Discharge status: Home / Self Care | Payer: Medicaid Other | Source: Ambulatory Visit | Attending: Orthopedic Surgery | Admitting: Orthopedic Surgery

## 2015-04-24 DIAGNOSIS — S42202D Unspecified fracture of upper end of left humerus, subsequent encounter for fracture with routine healing: Secondary | ICD-10-CM | POA: Diagnosis not present

## 2015-04-24 DIAGNOSIS — X58XXXD Exposure to other specified factors, subsequent encounter: Secondary | ICD-10-CM | POA: Insufficient documentation

## 2015-04-24 DIAGNOSIS — T148XXA Other injury of unspecified body region, initial encounter: Secondary | ICD-10-CM

## 2015-04-25 ENCOUNTER — Ambulatory Visit (INDEPENDENT_AMBULATORY_CARE_PROVIDER_SITE_OTHER): Payer: Medicaid Other | Admitting: Orthopedic Surgery

## 2015-04-25 ENCOUNTER — Encounter: Payer: Self-pay | Admitting: Orthopedic Surgery

## 2015-04-25 VITALS — BP 109/63 | Ht 67.0 in | Wt 290.0 lb

## 2015-04-25 DIAGNOSIS — S42202D Unspecified fracture of upper end of left humerus, subsequent encounter for fracture with routine healing: Secondary | ICD-10-CM | POA: Diagnosis not present

## 2015-04-25 NOTE — Patient Instructions (Signed)
Okay to remove brace 

## 2015-04-25 NOTE — Progress Notes (Signed)
FRACTURE CARE   Patient ID: [name], male   DOB: 07/02/1951, 64 y.o.   MRN: [id number]  Chief Complaint  Patient presents with  . Follow-up    1 month recheck on left humerus fracture, DOI 12-24-14.    DX left proximal humerus fracture  TREATMENT fracture brace  PAIN MEDS: Oxycodone 10  WEIGHT BEARING STATUS the patient is a flexion contracture in the left elbow show no weightbearing is really done through this arm  XRAYS x-rays show osteopenia at the fracture site but callus is seen across the fracture site on all views there is slight angulation of the fracture  EXAM the overall alignment of the arm clinically looks fine of course is very heavy. His arm has no motion at the fracture site and there is no tenderness and I can abduct his arm 80 which is pretty good for him.  There were no vitals taken for this visit.  He was followed by Dr. Luna Glasgow for shoulder pain and chronic pain and will continue therefore his pain medications    ASSESSMENT AND PLAN   Healed proximal humerus fracture with slight angulation but acceptable in the setting of the patient's medical problems and medical history  The brace is removed

## 2015-04-26 ENCOUNTER — Ambulatory Visit (HOSPITAL_COMMUNITY)
Admission: RE | Admit: 2015-04-26 | Discharge: 2015-04-26 | Disposition: A | Discharge status: Home / Self Care | Payer: Medicaid Other | Source: Ambulatory Visit | Attending: Vascular Surgery | Admitting: Vascular Surgery

## 2015-04-26 ENCOUNTER — Ambulatory Visit (INDEPENDENT_AMBULATORY_CARE_PROVIDER_SITE_OTHER)
Admission: RE | Admit: 2015-04-26 | Discharge: 2015-04-26 | Disposition: A | Discharge status: Home / Self Care | Payer: Medicaid Other | Source: Ambulatory Visit | Attending: Vascular Surgery | Admitting: Vascular Surgery

## 2015-04-26 DIAGNOSIS — L97909 Non-pressure chronic ulcer of unspecified part of unspecified lower leg with unspecified severity: Secondary | ICD-10-CM

## 2015-04-26 DIAGNOSIS — Z01810 Encounter for preprocedural cardiovascular examination: Secondary | ICD-10-CM

## 2015-04-26 DIAGNOSIS — G458 Other transient cerebral ischemic attacks and related syndromes: Secondary | ICD-10-CM | POA: Diagnosis not present

## 2015-04-26 DIAGNOSIS — I70299 Other atherosclerosis of native arteries of extremities, unspecified extremity: Secondary | ICD-10-CM

## 2015-04-26 DIAGNOSIS — I70209 Unspecified atherosclerosis of native arteries of extremities, unspecified extremity: Secondary | ICD-10-CM | POA: Insufficient documentation

## 2015-04-26 DIAGNOSIS — I6523 Occlusion and stenosis of bilateral carotid arteries: Secondary | ICD-10-CM | POA: Insufficient documentation

## 2015-04-29 ENCOUNTER — Encounter: Payer: Self-pay | Admitting: Vascular Surgery

## 2015-05-01 ENCOUNTER — Ambulatory Visit (INDEPENDENT_AMBULATORY_CARE_PROVIDER_SITE_OTHER): Payer: Self-pay | Admitting: Family

## 2015-05-01 ENCOUNTER — Encounter: Payer: Self-pay | Admitting: Family

## 2015-05-01 VITALS — BP 113/64 | HR 76 | Temp 97.6°F | Resp 20 | Ht 67.0 in | Wt 302.4 lb

## 2015-05-01 DIAGNOSIS — I70299 Other atherosclerosis of native arteries of extremities, unspecified extremity: Secondary | ICD-10-CM

## 2015-05-01 DIAGNOSIS — I739 Peripheral vascular disease, unspecified: Secondary | ICD-10-CM

## 2015-05-01 DIAGNOSIS — F17200 Nicotine dependence, unspecified, uncomplicated: Secondary | ICD-10-CM

## 2015-05-01 DIAGNOSIS — E1159 Type 2 diabetes mellitus with other circulatory complications: Secondary | ICD-10-CM

## 2015-05-01 DIAGNOSIS — L97909 Non-pressure chronic ulcer of unspecified part of unspecified lower leg with unspecified severity: Secondary | ICD-10-CM

## 2015-05-01 DIAGNOSIS — E1151 Type 2 diabetes mellitus with diabetic peripheral angiopathy without gangrene: Secondary | ICD-10-CM

## 2015-05-01 DIAGNOSIS — I779 Disorder of arteries and arterioles, unspecified: Secondary | ICD-10-CM | POA: Insufficient documentation

## 2015-05-01 DIAGNOSIS — Z720 Tobacco use: Secondary | ICD-10-CM

## 2015-05-01 NOTE — Patient Instructions (Signed)
Peripheral Vascular Disease Peripheral Vascular Disease (PVD), also called Peripheral Arterial Disease (PAD), is a circulation problem caused by cholesterol (atherosclerotic plaque) deposits in the arteries. PVD commonly occurs in the lower extremities (legs) but it can occur in other areas of the body, such as your arms. The cholesterol buildup in the arteries reduces blood flow which can cause pain and other serious problems. The presence of PVD can place a person at risk for Coronary Artery Disease (CAD).  CAUSES  Causes of PVD can be many. It is usually associated with more than one risk factor such as:   High Cholesterol.  Smoking.  Diabetes.  Lack of exercise or inactivity.  High blood pressure (hypertension).  Obesity.  Family history. SYMPTOMS   When the lower extremities are affected, patients with PVD may experience:  Leg pain with exertion or physical activity. This is called INTERMITTENT CLAUDICATION. This may present as cramping or numbness with physical activity. The location of the pain is associated with the level of blockage. For example, blockage at the abdominal level (distal abdominal aorta) may result in buttock or hip pain. Lower leg arterial blockage may result in calf pain.  As PVD becomes more severe, pain can develop with less physical activity.  In people with severe PVD, leg pain may occur at rest.  Other PVD signs and symptoms:  Leg numbness or weakness.  Coldness in the affected leg or foot, especially when compared to the other leg.  A change in leg color.  Patients with significant PVD are more prone to ulcers or sores on toes, feet or legs. These may take longer to heal or may reoccur. The ulcers or sores can become infected.  If signs and symptoms of PVD are ignored, gangrene may occur. This can result in the loss of toes or loss of an entire limb.  Not all leg pain is related to PVD. Other medical conditions can cause leg pain such  as:  Blood clots (embolism) or Deep Vein Thrombosis.  Inflammation of the blood vessels (vasculitis).  Spinal stenosis. DIAGNOSIS  Diagnosis of PVD can involve several different types of tests. These can include:  Pulse Volume Recording Method (PVR). This test is simple, painless and does not involve the use of X-rays. PVR involves measuring and comparing the blood pressure in the arms and legs. An ABI (Ankle-Brachial Index) is calculated. The normal ratio of blood pressures is 1. As this number becomes smaller, it indicates more severe disease.  < 0.95 - indicates significant narrowing in one or more leg vessels.  <0.8 - there will usually be pain in the foot, leg or buttock with exercise.  <0.4 - will usually have pain in the legs at rest.  <0.25 - usually indicates limb threatening PVD.  Doppler detection of pulses in the legs. This test is painless and checks to see if you have a pulses in your legs/feet.  A dye or contrast material (a substance that highlights the blood vessels so they show up on x-ray) may be given to help your caregiver better see the arteries for the following tests. The dye is eliminated from your body by the kidney's. Your caregiver may order blood work to check your kidney function and other laboratory values before the following tests are performed:  Magnetic Resonance Angiography (MRA). An MRA is a picture study of the blood vessels and arteries. The MRA machine uses a large magnet to produce images of the blood vessels.  Computed Tomography Angiography (CTA). A CTA   is a specialized x-ray that looks at how the blood flows in your blood vessels. An IV may be inserted into your arm so contrast dye can be injected.  Angiogram. Is a procedure that uses x-rays to look at your blood vessels. This procedure is minimally invasive, meaning a small incision (cut) is made in your groin. A small tube (catheter) is then inserted into the artery of your groin. The catheter  is guided to the blood vessel or artery your caregiver wants to examine. Contrast dye is injected into the catheter. X-rays are then taken of the blood vessel or artery. After the images are obtained, the catheter is taken out. TREATMENT  Treatment of PVD involves many interventions which may include:  Lifestyle changes:  Quitting smoking.  Exercise.  Following a low fat, low cholesterol diet.  Control of diabetes.  Foot care is very important to the PVD patient. Good foot care can help prevent infection.  Medication:  Cholesterol-lowering medicine.  Blood pressure medicine.  Anti-platelet drugs.  Certain medicines may reduce symptoms of Intermittent Claudication.  Interventional/Surgical options:  Angioplasty. An Angioplasty is a procedure that inflates a balloon in the blocked artery. This opens the blocked artery to improve blood flow.  Stent Implant. A wire mesh tube (stent) is placed in the artery. The stent expands and stays in place, allowing the artery to remain open.  Peripheral Bypass Surgery. This is a surgical procedure that reroutes the blood around a blocked artery to help improve blood flow. This type of procedure may be performed if Angioplasty or stent implants are not an option. SEEK IMMEDIATE MEDICAL CARE IF:   You develop pain or numbness in your arms or legs.  Your arm or leg turns cold, becomes blue in color.  You develop redness, warmth, swelling and pain in your arms or legs. MAKE SURE YOU:   Understand these instructions.  Will watch your condition.  Will get help right away if you are not doing well or get worse. Document Released: 11/05/2004 Document Revised: 12/21/2011 Document Reviewed: 10/02/2008 [employer] Patient Information 2015 [employer]. This information is not intended to replace advice given to you by your health care provider. Make sure you discuss any questions you have with your health care provider.    Smoking  Cessation Quitting smoking is important to your health and has many advantages. However, it is not always easy to quit since nicotine is a very addictive drug. Oftentimes, people try 3 times or more before being able to quit. This document explains the best ways for you to prepare to quit smoking. Quitting takes hard work and a lot of effort, but you can do it. ADVANTAGES OF QUITTING SMOKING  You will live longer, feel better, and live better.  Your body will feel the impact of quitting smoking almost immediately.  Within 20 minutes, blood pressure decreases. Your pulse returns to its normal level.  After 8 hours, carbon monoxide levels in the blood return to normal. Your oxygen level increases.  After 24 hours, the chance of having a heart attack starts to decrease. Your breath, hair, and body stop smelling like smoke.  After 48 hours, damaged nerve endings begin to recover. Your sense of taste and smell improve.  After 72 hours, the body is virtually free of nicotine. Your bronchial tubes relax and breathing becomes easier.  After 2 to 12 weeks, lungs can hold more air. Exercise becomes easier and circulation improves.  The risk of having a heart attack, stroke,   cancer, or lung disease is greatly reduced.  After 1 year, the risk of coronary heart disease is cut in half.  After 5 years, the risk of stroke falls to the same as a nonsmoker.  After 10 years, the risk of lung cancer is cut in half and the risk of other cancers decreases significantly.  After 15 years, the risk of coronary heart disease drops, usually to the level of a nonsmoker.  If you are pregnant, quitting smoking will improve your chances of having a healthy baby.  The people you live with, especially any children, will be healthier.  You will have extra money to spend on things other than cigarettes. QUESTIONS TO THINK ABOUT BEFORE ATTEMPTING TO QUIT You may want to talk about your answers with your health care  provider.  Why do you want to quit?  If you tried to quit in the past, what helped and what did not?  What will be the most difficult situations for you after you quit? How will you plan to handle them?  Who can help you through the tough times? Your family? Friends? A health care provider?  What pleasures do you get from smoking? What ways can you still get pleasure if you quit? Here are some questions to ask your health care provider:  How can you help me to be successful at quitting?  What medicine do you think would be best for me and how should I take it?  What should I do if I need more help?  What is smoking withdrawal like? How can I get information on withdrawal? GET READY  Set a quit date.  Change your environment by getting rid of all cigarettes, ashtrays, matches, and lighters in your home, car, or work. Do not let people smoke in your home.  Review your past attempts to quit. Think about what worked and what did not. GET SUPPORT AND ENCOURAGEMENT You have a better chance of being successful if you have help. You can get support in many ways.  Tell your family, friends, and coworkers that you are going to quit and need their support. Ask them not to smoke around you.  Get individual, group, or telephone counseling and support. Programs are available at local hospitals and health centers. Call your local health department for information about programs in your area.  Spiritual beliefs and practices may help some smokers quit.  Download a "quit meter" on your computer to keep track of quit statistics, such as how long you have gone without smoking, cigarettes not smoked, and money saved.  Get a self-help book about quitting smoking and staying off tobacco. LEARN NEW SKILLS AND BEHAVIORS  Distract yourself from urges to smoke. Talk to someone, go for a walk, or occupy your time with a task.  Change your normal routine. Take a different route to work. Drink tea  instead of coffee. Eat breakfast in a different place.  Reduce your stress. Take a hot bath, exercise, or read a book.  Plan something enjoyable to do every day. Reward yourself for not smoking.  Explore interactive web-based programs that specialize in helping you quit. GET MEDICINE AND USE IT CORRECTLY Medicines can help you stop smoking and decrease the urge to smoke. Combining medicine with the above behavioral methods and support can greatly increase your chances of successfully quitting smoking.  Nicotine replacement therapy helps deliver nicotine to your body without the negative effects and risks of smoking. Nicotine replacement therapy includes nicotine gum, lozenges,   inhalers, nasal sprays, and skin patches. Some may be available over-the-counter and others require a prescription.  Antidepressant medicine helps people abstain from smoking, but how this works is unknown. This medicine is available by prescription.  Nicotinic receptor partial agonist medicine simulates the effect of nicotine in your brain. This medicine is available by prescription. Ask your health care provider for advice about which medicines to use and how to use them based on your health history. Your health care provider will tell you what side effects to look out for if you choose to be on a medicine or therapy. Carefully read the information on the package. Do not use any other product containing nicotine while using a nicotine replacement product.  RELAPSE OR DIFFICULT SITUATIONS Most relapses occur within the first 3 months after quitting. Do not be discouraged if you start smoking again. Remember, most people try several times before finally quitting. You may have symptoms of withdrawal because your body is used to nicotine. You may crave cigarettes, be irritable, feel very hungry, cough often, get headaches, or have difficulty concentrating. The withdrawal symptoms are only temporary. They are strongest when you  first quit, but they will go away within 10-14 days. To reduce the chances of relapse, try to:  Avoid drinking alcohol. Drinking lowers your chances of successfully quitting.  Reduce the amount of caffeine you consume. Once you quit smoking, the amount of caffeine in your body increases and can give you symptoms, such as a rapid heartbeat, sweating, and anxiety.  Avoid smokers because they can make you want to smoke.  Do not let weight gain distract you. Many smokers will gain weight when they quit, usually less than 10 pounds. Eat a healthy diet and stay active. You can always lose the weight gained after you quit.  Find ways to improve your mood other than smoking. FOR MORE INFORMATION  www.smokefree.gov  Document Released: 09/22/2001 Document Revised: 02/12/2014 Document Reviewed: 01/07/2012 [employer] Patient Information 2015 [employer]. This information is not intended to replace advice given to you by your health care provider. Make sure you discuss any questions you have with your health care provider.    Smoking Cessation, Tips for Success If you are ready to quit smoking, congratulations! You have chosen to help yourself be healthier. Cigarettes bring nicotine, tar, carbon monoxide, and other irritants into your body. Your lungs, heart, and blood vessels will be able to work better without these poisons. There are many different ways to quit smoking. Nicotine gum, nicotine patches, a nicotine inhaler, or nicotine nasal spray can help with physical craving. Hypnosis, support groups, and medicines help break the habit of smoking. WHAT THINGS CAN I DO TO MAKE QUITTING EASIER?  Here are some tips to help you quit for good:  Pick a date when you will quit smoking completely. Tell all of your friends and family about your plan to quit on that date.  Do not try to slowly cut down on the number of cigarettes you are smoking. Pick a quit date and quit smoking completely starting on that  day.  Throw away all cigarettes.   Clean and remove all ashtrays from your home, work, and car.  On a card, write down your reasons for quitting. Carry the card with you and read it when you get the urge to smoke.  Cleanse your body of nicotine. Drink enough water and fluids to keep your urine clear or pale yellow. Do this after quitting to flush the nicotine from   your body.  Learn to predict your moods. Do not let a bad situation be your excuse to have a cigarette. Some situations in your life might tempt you into wanting a cigarette.  Never have "just one" cigarette. It leads to wanting another and another. Remind yourself of your decision to quit.  Change habits associated with smoking. If you smoked while driving or when feeling stressed, try other activities to replace smoking. Stand up when drinking your coffee. Brush your teeth after eating. Sit in a different chair when you read the paper. Avoid alcohol while trying to quit, and try to drink fewer caffeinated beverages. Alcohol and caffeine may urge you to smoke.  Avoid foods and drinks that can trigger a desire to smoke, such as sugary or spicy foods and alcohol.  Ask people who smoke not to smoke around you.  Have something planned to do right after eating or having a cup of coffee. For example, plan to take a walk or exercise.  Try a relaxation exercise to calm you down and decrease your stress. Remember, you may be tense and nervous for the first 2 weeks after you quit, but this will pass.  Find new activities to keep your hands busy. Play with a pen, coin, or rubber band. Doodle or draw things on paper.  Brush your teeth right after eating. This will help cut down on the craving for the taste of tobacco after meals. You can also try mouthwash.   Use oral substitutes in place of cigarettes. Try using lemon drops, carrots, cinnamon sticks, or chewing gum. Keep them handy so they are available when you have the urge to  smoke.  When you have the urge to smoke, try deep breathing.  Designate your home as a nonsmoking area.  If you are a heavy smoker, ask your health care provider about a prescription for nicotine chewing gum. It can ease your withdrawal from nicotine.  Reward yourself. Set aside the cigarette money you save and buy yourself something nice.  Look for support from others. Join a support group or smoking cessation program. Ask someone at home or at work to help you with your plan to quit smoking.  Always ask yourself, "Do I need this cigarette or is this just a reflex?" Tell yourself, "Today, I choose not to smoke," or "I do not want to smoke." You are reminding yourself of your decision to quit.  Do not replace cigarette smoking with electronic cigarettes (commonly called e-cigarettes). The safety of e-cigarettes is unknown, and some may contain harmful chemicals.  If you relapse, do not give up! Plan ahead and think about what you will do the next time you get the urge to smoke. HOW WILL I FEEL WHEN I QUIT SMOKING? You may have symptoms of withdrawal because your body is used to nicotine (the addictive substance in cigarettes). You may crave cigarettes, be irritable, feel very hungry, cough often, get headaches, or have difficulty concentrating. The withdrawal symptoms are only temporary. They are strongest when you first quit but will go away within 10-14 days. When withdrawal symptoms occur, stay in control. Think about your reasons for quitting. Remind yourself that these are signs that your body is healing and getting used to being without cigarettes. Remember that withdrawal symptoms are easier to treat than the major diseases that smoking can cause.  Even after the withdrawal is over, expect periodic urges to smoke. However, these cravings are generally short lived and will go away whether you   smoke or not. Do not smoke! WHAT RESOURCES ARE AVAILABLE TO HELP ME QUIT SMOKING? Your health care  provider can direct you to community resources or hospitals for support, which may include:  Group support.  Education.  Hypnosis.  Therapy. Document Released: 06/26/2004 Document Revised: 02/12/2014 Document Reviewed: 03/16/2013 [employer] Patient Information 2015 [employer]. This information is not intended to replace advice given to you by your health care provider. Make sure you discuss any questions you have with your health care provider.  
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
## 2015-05-02 NOTE — Addendum Note (Signed)
Addended by: Dorthula Rue L on: 05/02/2015 10:21 AM   Modules accepted: Orders

## 2015-05-16 ENCOUNTER — Telehealth: Payer: Self-pay | Admitting: Orthopedic Surgery

## 2015-05-16 NOTE — Telephone Encounter (Signed)
Patient/wife called to relay that patient had fallen out of the bed "the night before last", (05/14/15) and thinks now he may have re-injured his arm. Asked about appointment for this new problem.  Due to our schedule's next available time, and due to patient's insurance requirement of primary care referral needed, advised patient to contact primary care, and to be advised from primary care,  Provider may need to see patient. Relayed we will be glad to schedule upon receipt of referral.

## 2015-05-21 ENCOUNTER — Emergency Department (HOSPITAL_COMMUNITY)
Admission: EM | Admit: 2015-05-21 | Discharge: 2015-05-21 | Disposition: A | Discharge status: Home / Self Care | Payer: Medicaid Other | Attending: Emergency Medicine | Admitting: Emergency Medicine

## 2015-05-21 ENCOUNTER — Encounter (HOSPITAL_COMMUNITY): Payer: Self-pay | Admitting: *Deleted

## 2015-05-21 ENCOUNTER — Emergency Department (HOSPITAL_COMMUNITY): Payer: Medicaid Other

## 2015-05-21 DIAGNOSIS — S4992XA Unspecified injury of left shoulder and upper arm, initial encounter: Secondary | ICD-10-CM | POA: Diagnosis present

## 2015-05-21 DIAGNOSIS — Z720 Tobacco use: Secondary | ICD-10-CM | POA: Insufficient documentation

## 2015-05-21 DIAGNOSIS — I10 Essential (primary) hypertension: Secondary | ICD-10-CM | POA: Diagnosis not present

## 2015-05-21 DIAGNOSIS — G629 Polyneuropathy, unspecified: Secondary | ICD-10-CM | POA: Insufficient documentation

## 2015-05-21 DIAGNOSIS — E785 Hyperlipidemia, unspecified: Secondary | ICD-10-CM | POA: Insufficient documentation

## 2015-05-21 DIAGNOSIS — I252 Old myocardial infarction: Secondary | ICD-10-CM | POA: Insufficient documentation

## 2015-05-21 DIAGNOSIS — Y9289 Other specified places as the place of occurrence of the external cause: Secondary | ICD-10-CM | POA: Insufficient documentation

## 2015-05-21 DIAGNOSIS — Z8781 Personal history of (healed) traumatic fracture: Secondary | ICD-10-CM | POA: Insufficient documentation

## 2015-05-21 DIAGNOSIS — I2510 Atherosclerotic heart disease of native coronary artery without angina pectoris: Secondary | ICD-10-CM | POA: Insufficient documentation

## 2015-05-21 DIAGNOSIS — Y998 Other external cause status: Secondary | ICD-10-CM | POA: Diagnosis not present

## 2015-05-21 DIAGNOSIS — W19XXXA Unspecified fall, initial encounter: Secondary | ICD-10-CM

## 2015-05-21 DIAGNOSIS — Z79899 Other long term (current) drug therapy: Secondary | ICD-10-CM | POA: Insufficient documentation

## 2015-05-21 DIAGNOSIS — S42202A Unspecified fracture of upper end of left humerus, initial encounter for closed fracture: Secondary | ICD-10-CM

## 2015-05-21 DIAGNOSIS — S42292A Other displaced fracture of upper end of left humerus, initial encounter for closed fracture: Secondary | ICD-10-CM | POA: Diagnosis not present

## 2015-05-21 DIAGNOSIS — Z7902 Long term (current) use of antithrombotics/antiplatelets: Secondary | ICD-10-CM | POA: Diagnosis not present

## 2015-05-21 DIAGNOSIS — Y9389 Activity, other specified: Secondary | ICD-10-CM | POA: Diagnosis not present

## 2015-05-21 DIAGNOSIS — Z8673 Personal history of transient ischemic attack (TIA), and cerebral infarction without residual deficits: Secondary | ICD-10-CM | POA: Insufficient documentation

## 2015-05-21 DIAGNOSIS — W06XXXA Fall from bed, initial encounter: Secondary | ICD-10-CM | POA: Insufficient documentation

## 2015-05-21 DIAGNOSIS — Z7982 Long term (current) use of aspirin: Secondary | ICD-10-CM | POA: Insufficient documentation

## 2015-05-21 DIAGNOSIS — S99922A Unspecified injury of left foot, initial encounter: Secondary | ICD-10-CM | POA: Insufficient documentation

## 2015-05-21 DIAGNOSIS — Z87442 Personal history of urinary calculi: Secondary | ICD-10-CM | POA: Insufficient documentation

## 2015-05-21 DIAGNOSIS — G8929 Other chronic pain: Secondary | ICD-10-CM | POA: Insufficient documentation

## 2015-05-21 DIAGNOSIS — E119 Type 2 diabetes mellitus without complications: Secondary | ICD-10-CM | POA: Diagnosis not present

## 2015-05-21 DIAGNOSIS — Z794 Long term (current) use of insulin: Secondary | ICD-10-CM | POA: Diagnosis not present

## 2015-05-21 MED ORDER — HYDROCODONE-ACETAMINOPHEN 5-325 MG PO TABS
2.0000 | ORAL_TABLET | Freq: Once | ORAL | Status: AC
  Administered 2015-05-21: 2 via ORAL
  Filled 2015-05-21: qty 2

## 2015-05-21 MED ORDER — HYDROCODONE-ACETAMINOPHEN 5-325 MG PO TABS
2.0000 | ORAL_TABLET | ORAL | Status: DC | PRN
Start: 2015-05-21 — End: 2015-08-29

## 2015-05-21 MED ORDER — IBUPROFEN 800 MG PO TABS: 800.0000 mg | ORAL_TABLET | Freq: Three times a day (TID) | ORAL | Status: DC
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
## 2015-05-21 NOTE — Discharge Instructions (Signed)
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
## 2015-05-23 ENCOUNTER — Ambulatory Visit (INDEPENDENT_AMBULATORY_CARE_PROVIDER_SITE_OTHER): Payer: Medicaid Other | Admitting: Orthopedic Surgery

## 2015-05-23 ENCOUNTER — Encounter: Payer: Self-pay | Admitting: Orthopedic Surgery

## 2015-05-23 VITALS — BP 103/53 | Ht 67.0 in | Wt 303.0 lb

## 2015-05-23 DIAGNOSIS — S42202A Unspecified fracture of upper end of left humerus, initial encounter for closed fracture: Secondary | ICD-10-CM

## 2015-05-23 MED ORDER — OXYCODONE-ACETAMINOPHEN 10-325 MG PO TABS: 1.0000 | ORAL_TABLET | ORAL | Status: DC | PRN
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
## 2015-06-06 ENCOUNTER — Ambulatory Visit: Payer: Medicaid Other | Admitting: Orthopedic Surgery

## 2015-06-07 ENCOUNTER — Encounter: Payer: Self-pay | Admitting: Cardiology

## 2015-06-07 ENCOUNTER — Ambulatory Visit (INDEPENDENT_AMBULATORY_CARE_PROVIDER_SITE_OTHER): Payer: Medicaid Other | Admitting: Cardiology

## 2015-06-07 VITALS — BP 112/60 | HR 76 | Ht 67.0 in | Wt 302.0 lb

## 2015-06-07 DIAGNOSIS — I4891 Unspecified atrial fibrillation: Secondary | ICD-10-CM | POA: Diagnosis not present

## 2015-06-07 DIAGNOSIS — I70299 Other atherosclerosis of native arteries of extremities, unspecified extremity: Secondary | ICD-10-CM

## 2015-06-07 DIAGNOSIS — I10 Essential (primary) hypertension: Secondary | ICD-10-CM | POA: Diagnosis not present

## 2015-06-07 DIAGNOSIS — E785 Hyperlipidemia, unspecified: Secondary | ICD-10-CM | POA: Diagnosis not present

## 2015-06-07 DIAGNOSIS — Z720 Tobacco use: Secondary | ICD-10-CM

## 2015-06-07 DIAGNOSIS — I779 Disorder of arteries and arterioles, unspecified: Secondary | ICD-10-CM

## 2015-06-07 DIAGNOSIS — I4892 Unspecified atrial flutter: Secondary | ICD-10-CM

## 2015-06-07 DIAGNOSIS — I739 Peripheral vascular disease, unspecified: Secondary | ICD-10-CM

## 2015-06-07 DIAGNOSIS — L97909 Non-pressure chronic ulcer of unspecified part of unspecified lower leg with unspecified severity: Secondary | ICD-10-CM

## 2015-06-07 DIAGNOSIS — F17200 Nicotine dependence, unspecified, uncomplicated: Secondary | ICD-10-CM

## 2015-06-07 DIAGNOSIS — I255 Ischemic cardiomyopathy: Secondary | ICD-10-CM

## 2015-06-07 NOTE — Assessment & Plan Note (Signed)
Continue aspirin and statin. Followed by vascular surgery. 

## 2015-06-07 NOTE — Assessment & Plan Note (Signed)
Continue statin. Check lipids and liver. 

## 2015-06-07 NOTE — Assessment & Plan Note (Signed)
Patient remains in sinus rhythm on examination. Continue amiodarone. Check TSH, liver functions and chest x-ray. Given multiple falls he is felt to be too high risk for anticoagulation.

## 2015-06-07 NOTE — Patient Instructions (Signed)
Your physician wants you to follow-up in: [place] will receive a reminder letter in the mail two months in advance. If you don't receive a letter, please call our office to schedule the follow-up appointment.   A chest x-ray takes a picture of the organs and structures inside the chest, including the heart, lungs, and blood vessels. This test can show several things, including, whether the heart is enlarges; whether fluid is building up in the lungs; and whether pacemaker / defibrillator leads are still in place. AT [facility]  Your physician recommends that you return for lab work WHEN ABLE IN [place]

## 2015-06-07 NOTE — Assessment & Plan Note (Signed)
Blood pressure control. Continue present medications. Check potassium and renal function.

## 2015-06-07 NOTE — Assessment & Plan Note (Signed)
Continue ACE inhibitor and beta blocker. 

## 2015-06-07 NOTE — Assessment & Plan Note (Signed)
Continue aspirin and statin. 

## 2015-06-07 NOTE — Assessment & Plan Note (Signed)
Patient counseled on discontinuing. 

## 2015-06-11 ENCOUNTER — Ambulatory Visit: Payer: Medicaid Other | Admitting: Orthopedic Surgery

## 2015-06-12 ENCOUNTER — Other Ambulatory Visit: Payer: Self-pay | Admitting: Orthopedic Surgery

## 2015-06-12 ENCOUNTER — Ambulatory Visit (HOSPITAL_COMMUNITY)
Admission: RE | Admit: 2015-06-12 | Discharge: 2015-06-12 | Disposition: A | Discharge status: Home / Self Care | Payer: Medicaid Other | Source: Ambulatory Visit | Attending: Orthopedic Surgery | Admitting: Orthopedic Surgery

## 2015-06-12 ENCOUNTER — Ambulatory Visit (HOSPITAL_COMMUNITY)
Admission: RE | Admit: 2015-06-12 | Discharge: 2015-06-12 | Disposition: A | Discharge status: Home / Self Care | Payer: Medicaid Other | Source: Ambulatory Visit | Attending: Cardiology | Admitting: Cardiology

## 2015-06-12 DIAGNOSIS — S42292D Other displaced fracture of upper end of left humerus, subsequent encounter for fracture with routine healing: Secondary | ICD-10-CM | POA: Insufficient documentation

## 2015-06-12 DIAGNOSIS — X58XXXD Exposure to other specified factors, subsequent encounter: Secondary | ICD-10-CM | POA: Diagnosis not present

## 2015-06-12 DIAGNOSIS — R0602 Shortness of breath: Secondary | ICD-10-CM | POA: Insufficient documentation

## 2015-06-12 DIAGNOSIS — M25512 Pain in left shoulder: Secondary | ICD-10-CM

## 2015-06-12 DIAGNOSIS — R531 Weakness: Secondary | ICD-10-CM | POA: Insufficient documentation

## 2015-06-12 DIAGNOSIS — I4891 Unspecified atrial fibrillation: Secondary | ICD-10-CM

## 2015-06-13 ENCOUNTER — Ambulatory Visit (INDEPENDENT_AMBULATORY_CARE_PROVIDER_SITE_OTHER): Payer: Medicaid Other | Admitting: Orthopedic Surgery

## 2015-06-13 ENCOUNTER — Telehealth: Payer: Self-pay | Admitting: *Deleted

## 2015-06-13 VITALS — BP 101/50 | Ht 67.0 in | Wt 302.0 lb

## 2015-06-13 DIAGNOSIS — S42202D Unspecified fracture of upper end of left humerus, subsequent encounter for fracture with routine healing: Secondary | ICD-10-CM

## 2015-06-13 DIAGNOSIS — E785 Hyperlipidemia, unspecified: Secondary | ICD-10-CM

## 2015-06-13 LAB — LIPID PANEL
CHOLESTEROL: 174 mg/dL (ref 125–200)
HDL: 32 mg/dL — ABNORMAL LOW (ref >=40)
LDL Cholesterol: 94 mg/dL (ref <130)
Total CHOL/HDL Ratio: 5.4 Ratio — ABNORMAL HIGH (ref <=5.0)
Triglycerides: 242 mg/dL — ABNORMAL HIGH (ref <150)
VLDL: 48 mg/dL — AB (ref <30)

## 2015-06-13 LAB — BASIC METABOLIC PANEL
BUN: 11 mg/dL (ref 7–25)
CHLORIDE: 104 mmol/L (ref 98–110)
CO2: 27 mmol/L (ref 20–31)
Calcium: 8.4 mg/dL — ABNORMAL LOW (ref 8.6–10.3)
Creat: 0.87 mg/dL (ref 0.70–1.25)
GLUCOSE: 77 mg/dL (ref 65–99)
POTASSIUM: 4.8 mmol/L (ref 3.5–5.3)
SODIUM: 139 mmol/L (ref 135–146)

## 2015-06-13 LAB — HEPATIC FUNCTION PANEL
ALT: 11 U/L (ref 9–46)
AST: 14 U/L (ref 10–35)
Albumin: 3.8 g/dL (ref 3.6–5.1)
Alkaline Phosphatase: 88 U/L (ref 40–115)
BILIRUBIN INDIRECT: 0.3 mg/dL (ref 0.2–1.2)
BILIRUBIN TOTAL: 0.4 mg/dL (ref 0.2–1.2)
Bilirubin, Direct: 0.1 mg/dL (ref <=0.2)
Total Protein: 6.7 g/dL (ref 6.1–8.1)

## 2015-06-13 LAB — TSH: TSH: 3.286 u[IU]/mL (ref 0.350–4.500)

## 2015-06-13 MED ORDER — ATORVASTATIN CALCIUM 40 MG PO TABS: 40.0000 mg | ORAL_TABLET | Freq: Every day | ORAL | Status: DC

## 2015-06-13 NOTE — Progress Notes (Signed)
Patient ID: [name], male   DOB: 06-May-1951, 64 y.o.   MRN: [id number] Left shoulder fracture  Chief Complaint  Patient presents with  . Follow-up    2 week follow up left shoulder    Follow-up films show fracture stable with fracture sugar tong splint. Order larger extra-large fracture cuff call patient with time to come in and get fitted. Otherwise continued immobilization and sugar tong splint

## 2015-06-13 NOTE — Patient Instructions (Signed)
We will order humeral cuff and call you when it is here

## 2015-06-13 NOTE — Telephone Encounter (Signed)
New script sent to the pharmacy  Lab orders mailed to the pt  

## 2015-06-13 NOTE — Telephone Encounter (Signed)
-----   Message from Lelon Perla, MD sent at 06/13/2015  7:38 AM EDT ----- Change lipitior to 40 mg daily; lipids and liver in six weeks [facility]s

## 2015-06-20 ENCOUNTER — Telehealth: Payer: Self-pay | Admitting: Orthopedic Surgery

## 2015-06-20 ENCOUNTER — Other Ambulatory Visit: Payer: Self-pay | Admitting: *Deleted

## 2015-06-20 MED ORDER — OXYCODONE-ACETAMINOPHEN 10-325 MG PO TABS: 1.0000 | ORAL_TABLET | ORAL | Status: DC | PRN

## 2015-06-20 NOTE — Telephone Encounter (Signed)
Patient requests refill on pain medication (said "Percocet") - per chart, medication appears to be: HYDROcodone-acetaminophen (NORCO/VICODIN) 5-325 MG per tablet [[id number]] - please advise. Ph# [phone].

## 2015-06-20 NOTE — Telephone Encounter (Signed)
We will call him when it comes in

## 2015-06-20 NOTE — Telephone Encounter (Signed)
Patient/wife is asking about special order brace?  Please advise - home [phone].

## 2015-06-21 NOTE — Telephone Encounter (Signed)
Prescription available, patient aware  

## 2015-06-21 NOTE — Telephone Encounter (Signed)
Patient coming in Monday for brace

## 2015-06-24 ENCOUNTER — Other Ambulatory Visit: Payer: Self-pay | Admitting: *Deleted

## 2015-06-24 NOTE — Telephone Encounter (Signed)
Patient picked up prescription on 06/21/15.

## 2015-06-26 ENCOUNTER — Other Ambulatory Visit: Payer: Self-pay | Admitting: Cardiology

## 2015-06-26 NOTE — Telephone Encounter (Signed)
Rx has been sent to the pharmacy electronically. ° °

## 2015-07-01 ENCOUNTER — Other Ambulatory Visit: Payer: Self-pay | Admitting: *Deleted

## 2015-07-01 DIAGNOSIS — L97521 Non-pressure chronic ulcer of other part of left foot limited to breakdown of skin: Secondary | ICD-10-CM

## 2015-07-08 ENCOUNTER — Encounter: Payer: Self-pay | Admitting: Vascular Surgery

## 2015-07-10 ENCOUNTER — Encounter (HOSPITAL_COMMUNITY): Payer: Medicaid Other

## 2015-07-10 ENCOUNTER — Ambulatory Visit (INDEPENDENT_AMBULATORY_CARE_PROVIDER_SITE_OTHER): Payer: Medicaid Other | Admitting: Vascular Surgery

## 2015-07-10 ENCOUNTER — Encounter: Payer: Self-pay | Admitting: Vascular Surgery

## 2015-07-10 VITALS — BP 113/55 | HR 59 | Temp 98.3°F | Ht 67.0 in | Wt 302.0 lb

## 2015-07-10 DIAGNOSIS — I70209 Unspecified atherosclerosis of native arteries of extremities, unspecified extremity: Secondary | ICD-10-CM | POA: Diagnosis not present

## 2015-07-10 NOTE — Progress Notes (Signed)
History of Present Illness  [name] is a 64 y.o. (01-05-1951) male who presents for discussion of his left leg circulation and carotid stenosis. The patient previously underwent removal of a right ingrown toenail and subsequently developed a non-healing wound. The patient underwent an arteriogram on 04/08/15 with angioplasty and stenting of the right superficial femoral artery. His right great toe wound has now healed.   He wants to have his left great toe ingrown nail removed. He is afraid to undergo this due to previous complications with his right great toe. He states that he has had slowly healing wounds to his left foot that eventually heal. He denies any rest pain. He states that he gets left leg cramps with walking, but is minimally ambulatory. He continues to smoke.   Regarding his carotid stenosis, he has known right internal carotid artery occlusion. He denies any episodes of amaurosis fugax, hemiplegia, receptive or expressive aphasia.   He takes aspirin and a statin daily.   Physical Examination  Filed Vitals:   07/10/15 1333  BP: 113/55  Pulse: 59  Temp: 98.3 F (36.8 C)  TempSrc: Oral  Height: 5\' 7"  (1.702 m)  Weight: 302 lb (136.986 kg)  SpO2: 93%   Body mass index is 47.29 kg/(m^2).  General: A&O x 3, WD morbidly obese male in NAD  Pulmonary: Sym exp, good air movt  Vascular: Non palpable pedal pulses, but feet are warm bilaterally. 2+ pitting edema bilaterally. No wounds of left foot. Wound of right great toe well healed.   Musculoskeletal: Extremities without ischemic changes  Neurologic: Pain and light touch intact in extremities  Non-Invasive Vascular Imaging ABI (Date: 04/26/15)  R: 0.88  L: 0.53  Carotid Duplex (Date: 07/10/2015)  Right ICA occluded  Left: 1-39% proximal ICA, >50% left mid common carotid artery stenosis   Medical Decision Making  [name] is a 64 y.o. male who presents with bilateral peripheral vascular disease  and left asymptomatic carotid stenosis.   PVD  The patient is relatively asymptomatic in regards to his left lower extremity. He denies any rest pain and he has no wounds on his foot. He does not ambulate enough to elicit claudication. Recommend that he does not proceed with angiography unless there was an indication to do so. Also recommended that he does not proceed with left ingrown toe nail removal due to previous complications. He will follow up in 6 months with ABIs. He knows to return sooner if his symptoms worsen. He is on maximal medical management with ASA and a statin. Greater than 3 minutes was spent on smoking cessation counseling.  Left carotid artery stenosis The patient has been asymptomatic without history of stroke or TIA symptoms. The right internal carotid is occluded. The left demonstrates <39% stenosis. He will follow up in one year with repeat carotid duplex. He is on maximal medical management with ASA and a statin.  Virgina Jock, PA-C [facility] Office: [phone] Pager: [phone]  07/10/2015, 2:38 PM   This patient was seen in conjunction with Dr. Scot Dock   Agree with above. I have explained that given that he does not have rest pain or any nonhealing ulcers I would not recommend an aggressive approach to his superficial femoral artery occlusion on the left. He would be at increased risk because of his obesity. If he does develop a nonhealing wound then consideration could be given to attempting angioplasty of the distal left superficial femoral artery occlusion.  I'll see him back in 6 months. He knows to call sooner if he has problems.  Deitra Mayo, MD, [place] [phone]

## 2015-07-11 NOTE — Addendum Note (Signed)
Addended by: Dorthula Rue L on: 07/11/2015 09:32 AM   Modules accepted: Orders

## 2015-07-15 ENCOUNTER — Other Ambulatory Visit: Payer: Self-pay | Admitting: "Endocrinology

## 2015-07-18 ENCOUNTER — Other Ambulatory Visit: Payer: Self-pay | Admitting: *Deleted

## 2015-07-18 ENCOUNTER — Telehealth: Payer: Self-pay | Admitting: Orthopedic Surgery

## 2015-07-18 MED ORDER — OXYCODONE-ACETAMINOPHEN 10-325 MG PO TABS: 1.0000 | ORAL_TABLET | ORAL | Status: DC | PRN

## 2015-07-18 NOTE — Telephone Encounter (Signed)
Patient is calling requesting a refill on medication HYDROcodone-acetaminophen (NORCO/VICODIN) 5-325 MG per tablet please advise? 

## 2015-07-19 NOTE — Telephone Encounter (Signed)
Patients wife picked up Rx. 

## 2015-07-19 NOTE — Telephone Encounter (Signed)
Prescription available, patient aware  

## 2015-07-22 ENCOUNTER — Ambulatory Visit (HOSPITAL_COMMUNITY)
Admission: RE | Admit: 2015-07-22 | Discharge: 2015-07-22 | Disposition: A | Discharge status: Home / Self Care | Payer: Medicaid Other | Source: Ambulatory Visit | Attending: Orthopedic Surgery | Admitting: Orthopedic Surgery

## 2015-07-22 ENCOUNTER — Other Ambulatory Visit: Payer: Self-pay | Admitting: Orthopedic Surgery

## 2015-07-22 DIAGNOSIS — S42202A Unspecified fracture of upper end of left humerus, initial encounter for closed fracture: Secondary | ICD-10-CM

## 2015-07-22 DIAGNOSIS — S42202K Unspecified fracture of upper end of left humerus, subsequent encounter for fracture with nonunion: Secondary | ICD-10-CM | POA: Diagnosis present

## 2015-07-22 DIAGNOSIS — X58XXXD Exposure to other specified factors, subsequent encounter: Secondary | ICD-10-CM | POA: Insufficient documentation

## 2015-07-23 ENCOUNTER — Ambulatory Visit (INDEPENDENT_AMBULATORY_CARE_PROVIDER_SITE_OTHER): Payer: Self-pay | Admitting: Orthopedic Surgery

## 2015-07-23 ENCOUNTER — Telehealth: Payer: Self-pay | Admitting: Cardiology

## 2015-07-23 VITALS — BP 110/53 | Ht 67.0 in | Wt 302.0 lb

## 2015-07-23 DIAGNOSIS — S42202G Unspecified fracture of upper end of left humerus, subsequent encounter for fracture with delayed healing: Secondary | ICD-10-CM

## 2015-07-23 NOTE — Telephone Encounter (Signed)
Discussed with dr Stanford Breed, gabapentin dose will be fine.

## 2015-07-23 NOTE — Telephone Encounter (Signed)
Please call,she needs to talk to you about his medicine.

## 2015-07-23 NOTE — Telephone Encounter (Signed)
Spoke with pt wife, the pt was asked to increase his gabapentin 800 mg TID. They want to make sure this dose is okay prior to starting. Will discuss with dr Stanford Breed.

## 2015-07-23 NOTE — Progress Notes (Signed)
Patient ID: [name], male   DOB: 03-27-1951, 64 y.o.   MRN: [id number]  Follow up visit  Chief Complaint  Patient presents with  . Follow-up    follow up Left proximal humerus fracture    BP 110/53 mmHg  Ht 5\' 7"  (1.702 m)  Wt 302 lb (136.986 kg)  BMI 47.29 kg/m2  No diagnosis found.  We're going to say that his refracture occurred in July so is about 2 months out from a refracture proximal humerus he is a nonsurgical candidate based on his medical history he is in a fracture brace is on 10 mg of oxycodone. His x-ray today shows some callus forming on the lateral cortex and some healing there medial side looks to be fibrous still at this point  try to get a bone stimulator for him he has Medicaid so my not be able to do it but if possible.  X-ray in a month continue fracture brace

## 2015-07-25 ENCOUNTER — Ambulatory Visit (INDEPENDENT_AMBULATORY_CARE_PROVIDER_SITE_OTHER): Payer: Medicaid Other | Admitting: "Endocrinology

## 2015-07-25 ENCOUNTER — Encounter: Payer: Self-pay | Admitting: "Endocrinology

## 2015-07-25 VITALS — BP 142/76 | HR 47 | Ht 68.0 in

## 2015-07-25 DIAGNOSIS — I10 Essential (primary) hypertension: Secondary | ICD-10-CM | POA: Diagnosis not present

## 2015-07-25 DIAGNOSIS — E785 Hyperlipidemia, unspecified: Secondary | ICD-10-CM

## 2015-07-25 DIAGNOSIS — E1159 Type 2 diabetes mellitus with other circulatory complications: Secondary | ICD-10-CM | POA: Diagnosis not present

## 2015-07-25 LAB — HEMOGLOBIN A1C: Hgb A1c MFr Bld: 7.4 % — AB (ref 4.0–6.0)

## 2015-07-25 MED ORDER — INSULIN GLARGINE 100 UNIT/ML SOLOSTAR PEN: 50.0000 [IU] | PEN_INJECTOR | Freq: Every day | SUBCUTANEOUS | Status: DC
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
## 2015-07-25 NOTE — Patient Instructions (Signed)

## 2015-07-26 ENCOUNTER — Ambulatory Visit: Payer: Medicaid Other | Admitting: Urology

## 2015-07-26 ENCOUNTER — Ambulatory Visit (INDEPENDENT_AMBULATORY_CARE_PROVIDER_SITE_OTHER): Payer: Medicaid Other | Admitting: Urology

## 2015-07-26 DIAGNOSIS — R3915 Urgency of urination: Secondary | ICD-10-CM

## 2015-07-26 DIAGNOSIS — N471 Phimosis: Secondary | ICD-10-CM

## 2015-07-26 DIAGNOSIS — R350 Frequency of micturition: Secondary | ICD-10-CM | POA: Diagnosis not present

## 2015-07-29 ENCOUNTER — Other Ambulatory Visit: Payer: Self-pay | Admitting: Urology

## 2015-08-07 ENCOUNTER — Ambulatory Visit: Payer: Medicaid Other | Admitting: Vascular Surgery

## 2015-08-07 ENCOUNTER — Encounter (HOSPITAL_COMMUNITY): Payer: Medicaid Other

## 2015-08-08 ENCOUNTER — Ambulatory Visit (INDEPENDENT_AMBULATORY_CARE_PROVIDER_SITE_OTHER): Payer: Medicaid Other | Admitting: Otolaryngology

## 2015-08-08 DIAGNOSIS — Q381 Ankyloglossia: Secondary | ICD-10-CM

## 2015-08-14 ENCOUNTER — Other Ambulatory Visit: Payer: Self-pay | Admitting: *Deleted

## 2015-08-14 MED ORDER — OXYCODONE-ACETAMINOPHEN 10-325 MG PO TABS: 1.0000 | ORAL_TABLET | ORAL | Status: DC | PRN

## 2015-08-16 ENCOUNTER — Other Ambulatory Visit: Payer: Self-pay | Admitting: "Endocrinology

## 2015-08-16 NOTE — Patient Instructions (Signed)
[name]  08/16/2015     @PREFPERIOPPHARMACY @   Your procedure is scheduled on  08/21/2015  Report to [facility] at  30  A.M.  Call this number if you have problems the morning of surgery:  [phone]   Remember:  Do not eat food or drink liquids after midnight.  Take these medicines the morning of surgery with A SIP OF WATER : pacerone, zyrtec, proscar, neurontin, hydrocodone or ( percocet), lisinopril, protonix, metoprolol, requip, effexor. Take 1/2 your usual insulin dosage the night before your surgery. DO NOT take any diabetic medications the morning of your procedure.   Do not wear jewelry, make-up or nail polish.  Do not wear lotions, powders, or perfumes.  You may wear deodorant.  Do not shave 48 hours prior to surgery.  Men may shave face and neck.  Do not bring valuables to the hospital.  [facility] is not responsible for any belongings or valuables.  Contacts, dentures or bridgework may not be worn into surgery.  Leave your suitcase in the car.  After surgery it may be brought to your room.  For patients admitted to the hospital, discharge time will be determined by your treatment team.  Patients discharged the day of surgery will not be allowed to drive home.   Name and phone number of your driver:   family Special instructions:  none  Please read over the following fact sheets that you were given. Pain Booklet, Coughing and Deep Breathing, Surgical Site Infection Prevention, Anesthesia Post-op Instructions and Care and Recovery After Surgery      Circumcision, Adult Circumcision is a surgery to remove the foreskin of the penis or to cut the foreskin so the opening is larger. When only the foreskin is cut, it is called a dorsal (on top of the foreskin) incision. This procedure leaves the entire foreskin but makes the end of the foreskin looser so it can be pulled back over the head of the penis.  LET [facility] CARE PROVIDER KNOW ABOUT:  Any  allergies you have.  All medicines you are taking, including vitamins, herbs, eye drops, creams, and over the counter medicines.  Previous problems you or members of your family have had with the use of anesthetics.  Any blood disorders you have.  Previous surgeries you have had.  Medical conditions you have. RISKS AND COMPLICATIONS  Generally, circumcision is a safe procedure. However, as with any procedure, problems can occur. Possible problems include:  Bleeding.  Infection.  Pain.  Urethral injury. The urethra is the opening on the end of the penis that carries your urine out.  Breaking open of the surgical wound can occur from an unwanted erection after surgery. BEFORE THE PROCEDURE   Do not take aspirin or blood thinners for a week prior to surgery, or as your health care provider suggests.  Do not eat or drink anything after midnight the night before surgery, or as instructed.  Let your health care provider know if you develop a cold or other infection before surgery.  You should be present 1 hour before the procedure, or as directed.  Before the procedure, you will be washed and given a local anesthetic to make sure you feel no pain. PROCEDURE  An anesthetic will be given. When an anesthetic that just numbs the area of the procedure (local anesthetic) is used, the anesthetic will be injected with a needle into the skin of your penis to numb  the nerves of your foreskin. Your surgeon will remove the excess foreskin with a surgical knife. Absorbable sutures will be used to close the incision after the foreskin has been removed. The sutures will not need to be removed.  AFTER THE PROCEDURE A sterile dressing will be applied to the incision site. It may be difficult to keep the dressing in place. It is alright if the dressing comes off, especially at night. Air will help a scab to form which will eliminate the need for dressings during the day.   This information is not  intended to replace advice given to you by your health care provider. Make sure you discuss any questions you have with your health care provider.   Document Released: 10/18/2007 Document Revised: 10/19/2014 Document Reviewed: 02/28/2013 Elsevier Interactive Patient Education 2016 [employer] PATIENT INSTRUCTIONS POST-ANESTHESIA  IMMEDIATELY FOLLOWING SURGERY:  Do not drive or operate machinery for the first twenty four hours after surgery.  Do not make any important decisions for twenty four hours after surgery or while taking narcotic pain medications or sedatives.  If you develop intractable nausea and vomiting or a severe headache please notify your doctor immediately.  FOLLOW-UP:  Please make an appointment with your surgeon as instructed. You do not need to follow up with anesthesia unless specifically instructed to do so.  WOUND CARE INSTRUCTIONS (if applicable):  Keep a dry clean dressing on the anesthesia/puncture wound site if there is drainage.  Once the wound has quit draining you may leave it open to air.  Generally you should leave the bandage intact for twenty four hours unless there is drainage.  If the epidural site drains for more than 36-48 hours please call the anesthesia department.  QUESTIONS?:  Please feel free to call your physician or the hospital operator if you have any questions, and they will be happy to assist you.

## 2015-08-19 ENCOUNTER — Encounter (HOSPITAL_COMMUNITY): Payer: Self-pay

## 2015-08-19 ENCOUNTER — Other Ambulatory Visit: Payer: Self-pay | Admitting: Orthopedic Surgery

## 2015-08-19 ENCOUNTER — Encounter (HOSPITAL_COMMUNITY)
Admission: RE | Admit: 2015-08-19 | Discharge: 2015-08-19 | Disposition: A | Discharge status: Home / Self Care | Payer: Medicaid Other | Source: Ambulatory Visit | Attending: Urology | Admitting: Urology

## 2015-08-19 ENCOUNTER — Ambulatory Visit (HOSPITAL_COMMUNITY)
Admission: RE | Admit: 2015-08-19 | Discharge: 2015-08-19 | Disposition: A | Discharge status: Home / Self Care | Payer: Medicaid Other | Source: Ambulatory Visit | Attending: Orthopedic Surgery | Admitting: Orthopedic Surgery

## 2015-08-19 DIAGNOSIS — S42202D Unspecified fracture of upper end of left humerus, subsequent encounter for fracture with routine healing: Secondary | ICD-10-CM

## 2015-08-19 DIAGNOSIS — N471 Phimosis: Secondary | ICD-10-CM | POA: Diagnosis not present

## 2015-08-19 DIAGNOSIS — Z01818 Encounter for other preprocedural examination: Secondary | ICD-10-CM | POA: Diagnosis not present

## 2015-08-19 DIAGNOSIS — S42202K Unspecified fracture of upper end of left humerus, subsequent encounter for fracture with nonunion: Secondary | ICD-10-CM | POA: Insufficient documentation

## 2015-08-19 HISTORY — DX: Unspecified fracture of shaft of humerus, unspecified arm, initial encounter for closed fracture: S42.309A

## 2015-08-19 LAB — CBC WITH DIFFERENTIAL/PLATELET
Basophils Absolute: 0 10*3/uL (ref 0.0–0.1)
Basophils Relative: 0 %
EOS PCT: 1 %
Eosinophils Absolute: 0.1 10*3/uL (ref 0.0–0.7)
HEMATOCRIT: 40.8 % (ref 39.0–52.0)
HEMOGLOBIN: 13.1 g/dL (ref 13.0–17.0)
LYMPHS ABS: 3.5 10*3/uL (ref 0.7–4.0)
LYMPHS PCT: 33 %
MCH: 31.3 pg (ref 26.0–34.0)
MCHC: 32.1 g/dL (ref 30.0–36.0)
MCV: 97.6 fL (ref 78.0–100.0)
Monocytes Absolute: 0.7 10*3/uL (ref 0.1–1.0)
Monocytes Relative: 7 %
NEUTROS ABS: 6.2 10*3/uL (ref 1.7–7.7)
NEUTROS PCT: 59 %
Platelets: 186 10*3/uL (ref 150–400)
RBC: 4.18 MIL/uL — AB (ref 4.22–5.81)
RDW: 14.8 % (ref 11.5–15.5)
WBC: 10.5 10*3/uL (ref 4.0–10.5)

## 2015-08-19 LAB — BASIC METABOLIC PANEL
ANION GAP: 10 (ref 5–15)
BUN: 18 mg/dL (ref 6–20)
CHLORIDE: 104 mmol/L (ref 101–111)
CO2: 26 mmol/L (ref 22–32)
Calcium: 8.7 mg/dL — ABNORMAL LOW (ref 8.9–10.3)
Creatinine, Ser: 1.03 mg/dL (ref 0.61–1.24)
GFR calc Af Amer: >60 mL/min (ref >60)
GFR calc non Af Amer: >60 mL/min (ref >60)
GLUCOSE: 143 mg/dL — AB (ref 65–99)
POTASSIUM: 4.7 mmol/L (ref 3.5–5.1)
Sodium: 140 mmol/L (ref 135–145)

## 2015-08-19 NOTE — Progress Notes (Signed)
Dr Patsey Berthold reviewed History.  Notified of left humus fracture with brace and sleep apnea.  No orders given. Rosalyn Gess RN, BSN, CNOR, [employer]

## 2015-08-21 ENCOUNTER — Ambulatory Visit (HOSPITAL_COMMUNITY): Payer: Medicaid Other | Admitting: Anesthesiology

## 2015-08-21 ENCOUNTER — Encounter (HOSPITAL_COMMUNITY)
Admission: RE | Disposition: A | Discharge status: Home / Self Care | Payer: Self-pay | Source: Ambulatory Visit | Attending: Urology

## 2015-08-21 ENCOUNTER — Encounter (HOSPITAL_COMMUNITY): Payer: Self-pay | Admitting: *Deleted

## 2015-08-21 ENCOUNTER — Ambulatory Visit (HOSPITAL_COMMUNITY)
Admission: RE | Admit: 2015-08-21 | Discharge: 2015-08-21 | Disposition: A | Discharge status: Home / Self Care | Payer: Medicaid Other | Source: Ambulatory Visit | Attending: Urology | Admitting: Urology

## 2015-08-21 DIAGNOSIS — I10 Essential (primary) hypertension: Secondary | ICD-10-CM | POA: Insufficient documentation

## 2015-08-21 DIAGNOSIS — Z79899 Other long term (current) drug therapy: Secondary | ICD-10-CM | POA: Insufficient documentation

## 2015-08-21 DIAGNOSIS — Z794 Long term (current) use of insulin: Secondary | ICD-10-CM | POA: Insufficient documentation

## 2015-08-21 DIAGNOSIS — Z8673 Personal history of transient ischemic attack (TIA), and cerebral infarction without residual deficits: Secondary | ICD-10-CM | POA: Insufficient documentation

## 2015-08-21 DIAGNOSIS — G4733 Obstructive sleep apnea (adult) (pediatric): Secondary | ICD-10-CM | POA: Insufficient documentation

## 2015-08-21 DIAGNOSIS — Z7982 Long term (current) use of aspirin: Secondary | ICD-10-CM | POA: Insufficient documentation

## 2015-08-21 DIAGNOSIS — Z951 Presence of aortocoronary bypass graft: Secondary | ICD-10-CM | POA: Diagnosis not present

## 2015-08-21 DIAGNOSIS — F17210 Nicotine dependence, cigarettes, uncomplicated: Secondary | ICD-10-CM | POA: Diagnosis not present

## 2015-08-21 DIAGNOSIS — E785 Hyperlipidemia, unspecified: Secondary | ICD-10-CM | POA: Diagnosis not present

## 2015-08-21 DIAGNOSIS — N471 Phimosis: Secondary | ICD-10-CM | POA: Insufficient documentation

## 2015-08-21 DIAGNOSIS — Z6841 Body Mass Index (BMI) 40.0 and over, adult: Secondary | ICD-10-CM | POA: Insufficient documentation

## 2015-08-21 DIAGNOSIS — E669 Obesity, unspecified: Secondary | ICD-10-CM | POA: Insufficient documentation

## 2015-08-21 DIAGNOSIS — E1121 Type 2 diabetes mellitus with diabetic nephropathy: Secondary | ICD-10-CM | POA: Insufficient documentation

## 2015-08-21 DIAGNOSIS — I4892 Unspecified atrial flutter: Secondary | ICD-10-CM | POA: Diagnosis not present

## 2015-08-21 DIAGNOSIS — I2510 Atherosclerotic heart disease of native coronary artery without angina pectoris: Secondary | ICD-10-CM | POA: Insufficient documentation

## 2015-08-21 HISTORY — PX: CIRCUMCISION: SHX1350

## 2015-08-21 LAB — GLUCOSE, CAPILLARY
GLUCOSE-CAPILLARY: 114 mg/dL — AB (ref 65–99)
GLUCOSE-CAPILLARY: 96 mg/dL (ref 65–99)

## 2015-08-21 SURGERY — CIRCUMCISION, ADULT
Anesthesia: Monitor Anesthesia Care

## 2015-08-21 MED ORDER — MIDAZOLAM HCL 2 MG/2ML IJ SOLN
INTRAMUSCULAR | Status: AC
  Filled 2015-08-21: qty 4

## 2015-08-21 MED ORDER — EPHEDRINE SULFATE 50 MG/ML IJ SOLN
INTRAMUSCULAR | Status: DC | PRN
  Administered 2015-08-21: 15 mg via INTRAVENOUS

## 2015-08-21 MED ORDER — CEPHALEXIN 250 MG PO CAPS: 250.0000 mg | ORAL_CAPSULE | Freq: Four times a day (QID) | ORAL | Status: DC

## 2015-08-21 MED ORDER — LIDOCAINE HCL (PF) 1 % IJ SOLN
INTRAMUSCULAR | Status: AC
  Filled 2015-08-21: qty 30

## 2015-08-21 MED ORDER — MIDAZOLAM HCL 5 MG/5ML IJ SOLN
INTRAMUSCULAR | Status: DC | PRN
  Administered 2015-08-21 (×2): 1 mg via INTRAVENOUS

## 2015-08-21 MED ORDER — PROPOFOL 10 MG/ML IV BOLUS
INTRAVENOUS | Status: AC
Start: 2015-08-21 — End: 2015-08-21
  Filled 2015-08-21: qty 20

## 2015-08-21 MED ORDER — ONDANSETRON HCL 4 MG/2ML IJ SOLN
INTRAMUSCULAR | Status: AC
  Filled 2015-08-21: qty 2

## 2015-08-21 MED ORDER — CEFAZOLIN SODIUM 1-5 GM-% IV SOLN
1.0000 g | INTRAVENOUS | Status: AC
  Administered 2015-08-21: 1 g via INTRAVENOUS

## 2015-08-21 MED ORDER — FENTANYL CITRATE (PF) 100 MCG/2ML IJ SOLN: 25.0000 ug | INTRAMUSCULAR | Status: DC | PRN

## 2015-08-21 MED ORDER — OXYCODONE HCL 15 MG PO TABS: 15.0000 mg | ORAL_TABLET | ORAL | Status: DC | PRN

## 2015-08-21 MED ORDER — MIDAZOLAM HCL 2 MG/2ML IJ SOLN
1.0000 mg | INTRAMUSCULAR | Status: DC | PRN
  Administered 2015-08-21: 2 mg via INTRAVENOUS

## 2015-08-21 MED ORDER — FENTANYL CITRATE (PF) 100 MCG/2ML IJ SOLN
INTRAMUSCULAR | Status: DC | PRN
  Administered 2015-08-21: 25 ug via INTRAVENOUS
  Administered 2015-08-21: 50 ug via INTRAVENOUS
  Administered 2015-08-21: 25 ug via INTRAVENOUS

## 2015-08-21 MED ORDER — GLYCOPYRROLATE 0.2 MG/ML IJ SOLN
INTRAMUSCULAR | Status: AC
  Filled 2015-08-21: qty 1

## 2015-08-21 MED ORDER — PROPOFOL 500 MG/50ML IV EMUL
INTRAVENOUS | Status: DC | PRN
  Administered 2015-08-21: 25 ug/kg/min via INTRAVENOUS

## 2015-08-21 MED ORDER — 0.9 % SODIUM CHLORIDE (POUR BTL) OPTIME
TOPICAL | Status: DC | PRN
  Administered 2015-08-21: 500 mL

## 2015-08-21 MED ORDER — MIDAZOLAM HCL 2 MG/2ML IJ SOLN
INTRAMUSCULAR | Status: AC
  Filled 2015-08-21: qty 2

## 2015-08-21 MED ORDER — GLYCOPYRROLATE 0.2 MG/ML IJ SOLN: 0.1000 mg | Freq: Once | INTRAMUSCULAR | Status: DC

## 2015-08-21 MED ORDER — LACTATED RINGERS IV SOLN
INTRAVENOUS | Status: DC
  Administered 2015-08-21: 12:00:00 via INTRAVENOUS

## 2015-08-21 MED ORDER — GLYCOPYRROLATE 0.2 MG/ML IJ SOLN
0.2000 mg | Freq: Once | INTRAMUSCULAR | Status: AC
  Administered 2015-08-21: 0.2 mg via INTRAVENOUS

## 2015-08-21 MED ORDER — ONDANSETRON HCL 4 MG/2ML IJ SOLN: 4.0000 mg | Freq: Once | INTRAMUSCULAR | Status: DC | PRN

## 2015-08-21 MED ORDER — FENTANYL CITRATE (PF) 100 MCG/2ML IJ SOLN
INTRAMUSCULAR | Status: AC
  Filled 2015-08-21: qty 4

## 2015-08-21 MED ORDER — BACITRACIN-NEOMYCIN-POLYMYXIN 400-5-5000 EX OINT
TOPICAL_OINTMENT | CUTANEOUS | Status: AC
  Filled 2015-08-21: qty 1

## 2015-08-21 MED ORDER — LIDOCAINE HCL 1 % IJ SOLN
INTRAMUSCULAR | Status: DC | PRN
  Administered 2015-08-21: 17 mL

## 2015-08-21 MED ORDER — FENTANYL CITRATE (PF) 100 MCG/2ML IJ SOLN
INTRAMUSCULAR | Status: AC
  Filled 2015-08-21: qty 2

## 2015-08-21 MED ORDER — ONDANSETRON HCL 4 MG/2ML IJ SOLN
4.0000 mg | Freq: Once | INTRAMUSCULAR | Status: AC
  Administered 2015-08-21: 4 mg via INTRAVENOUS

## 2015-08-21 MED ORDER — CEFAZOLIN SODIUM 1-5 GM-% IV SOLN
INTRAVENOUS | Status: AC
Start: 2015-08-21 — End: 2015-08-21
  Filled 2015-08-21: qty 50

## 2015-08-21 SURGICAL SUPPLY — 31 items
BAG HAMPER (MISCELLANEOUS) ×3 IMPLANT
BANDAGE COBAN STERILE 2 (GAUZE/BANDAGES/DRESSINGS) ×3 IMPLANT
BANDAGE CONFORM 2X5YD N/S (GAUZE/BANDAGES/DRESSINGS) ×2 IMPLANT
BNDG CONFORM 2 STRL LF (GAUZE/BANDAGES/DRESSINGS) ×3 IMPLANT
DECANTER SPIKE VIAL GLASS SM (MISCELLANEOUS) IMPLANT
DRSG XEROFORM 1X8 (GAUZE/BANDAGES/DRESSINGS) ×3 IMPLANT
ELECT NDL TIP 2.8 STRL (NEEDLE) ×1 IMPLANT
ELECT NEEDLE TIP 2.8 STRL (NEEDLE) ×3 IMPLANT
ELECT REM PT RETURN 9FT ADLT (ELECTROSURGICAL) ×3
ELECTRODE REM PT RTRN 9FT ADLT (ELECTROSURGICAL) ×1 IMPLANT
GAUZE SPONGE 4X4 12PLY STRL (GAUZE/BANDAGES/DRESSINGS) ×3 IMPLANT
GAUZE SPONGE 4X4 16PLY XRAY LF (GAUZE/BANDAGES/DRESSINGS) ×3 IMPLANT
GLOVE BIO SURGEON STRL SZ8 (GLOVE) ×3 IMPLANT
GOWN STRL REUS W/ TWL XL LVL3 (GOWN DISPOSABLE) ×1 IMPLANT
GOWN STRL REUS W/TWL XL LVL3 (GOWN DISPOSABLE) ×3
KIT ROOM TURNOVER APOR (KITS) ×6 IMPLANT
NDL HYPO 25X1 1.5 SAFETY (NEEDLE) IMPLANT
NEEDLE HYPO 25X1 1.5 SAFETY (NEEDLE) IMPLANT
NS IRRIG 500ML POUR BTL (IV SOLUTION) IMPLANT
PACK MINOR (CUSTOM PROCEDURE TRAY) ×3 IMPLANT
PAD ARMBOARD 7.5X6 YLW CONV (MISCELLANEOUS) ×3 IMPLANT
SET BASIN LINEN APH (SET/KITS/TRAYS/PACK) ×3 IMPLANT
SUT VIC AB 3-0 SH 27 (SUTURE) ×3
SUT VIC AB 3-0 SH 27X BRD (SUTURE) IMPLANT
SUT VIC AB 4-0 PS2 27 (SUTURE) ×6 IMPLANT
SUT VICRYL 4-0 PS2 18IN ABS (SUTURE) ×6 IMPLANT
SYR CONTROL 10ML LL (SYRINGE) IMPLANT
TAPE SURG TRANSPARENT 2IN (GAUZE/BANDAGES/DRESSINGS) IMPLANT
TAPE TRANSPARENT 2IN (GAUZE/BANDAGES/DRESSINGS) ×2
TOWEL OR 17X26 10 PK STRL BLUE (TOWEL DISPOSABLE) ×3 IMPLANT
WATER STERILE IRR 500ML POUR (IV SOLUTION) IMPLANT

## 2015-08-21 NOTE — Discharge Instructions (Signed)
Circumcision, Adult Circumcision is a surgery to remove the foreskin of the penis or to cut the foreskin so the opening is larger. When only the foreskin is cut, it is called a dorsal (on top of the foreskin) incision. This procedure leaves the entire foreskin but makes the end of the foreskin looser so it can be pulled back over the head of the penis.  LET [facility] CARE PROVIDER KNOW ABOUT:  Any allergies you have.  All medicines you are taking, including vitamins, herbs, eye drops, creams, and over the counter medicines.  Previous problems you or members of your family have had with the use of anesthetics.  Any blood disorders you have.  Previous surgeries you have had.  Medical conditions you have. RISKS AND COMPLICATIONS  Generally, circumcision is a safe procedure. However, as with any procedure, problems can occur. Possible problems include:  Bleeding.  Infection.  Pain.  Urethral injury. The urethra is the opening on the end of the penis that carries your urine out.  Breaking open of the surgical wound can occur from an unwanted erection after surgery. BEFORE THE PROCEDURE   Do not take aspirin or blood thinners for a week prior to surgery, or as your health care provider suggests.  Do not eat or drink anything after midnight the night before surgery, or as instructed.  Let your health care provider know if you develop a cold or other infection before surgery.  You should be present 1 hour before the procedure, or as directed.  Before the procedure, you will be washed and given a local anesthetic to make sure you feel no pain. PROCEDURE  An anesthetic will be given. When an anesthetic that just numbs the area of the procedure (local anesthetic) is used, the anesthetic will be injected with a needle into the skin of your penis to numb the nerves of your foreskin. Your surgeon will remove the excess foreskin with a surgical knife. Absorbable sutures will be used to  close the incision after the foreskin has been removed. The sutures will not need to be removed.  AFTER THE PROCEDURE A sterile dressing will be applied to the incision site. It may be difficult to keep the dressing in place. It is alright if the dressing comes off, especially at night. Air will help a scab to form which will eliminate the need for dressings during the day.   This information is not intended to replace advice given to you by your health care provider. Make sure you discuss any questions you have with your health care provider.   Document Released: 10/18/2007 Document Revised: 10/19/2014 Document Reviewed: 02/28/2013 Elsevier Interactive Patient Education 2016 [employer]   PATIENT INSTRUCTIONS POST-ANESTHESIA  IMMEDIATELY FOLLOWING SURGERY:  Do not drive or operate machinery for the first twenty four hours after surgery.  Do not make any important decisions for twenty four hours after surgery or while taking narcotic pain medications or sedatives.  If you develop intractable nausea and vomiting or a severe headache please notify your doctor immediately.  FOLLOW-UP:  Please make an appointment with your surgeon as instructed. You do not need to follow up with anesthesia unless specifically instructed to do so.  WOUND CARE INSTRUCTIONS (if applicable):  Keep a dry clean dressing on the anesthesia/puncture wound site if there is drainage.  Once the wound has quit draining you may leave it open to air.  Generally you should leave the bandage intact for twenty four hours unless there is drainage.  If the epidural site drains for more than 36-48 hours please call the anesthesia department.  QUESTIONS?:  Please feel free to call your physician or the hospital operator if you have any questions, and they will be happy to assist you.

## 2015-08-21 NOTE — Anesthesia Preprocedure Evaluation (Addendum)
Anesthesia Evaluation  Patient identified by MRN, date of birth, ID band Patient awake    Reviewed: Allergy & Precautions, NPO status , Patient's Chart, lab work & pertinent test results  Airway Mallampati: II  TM Distance: >3 FB     Dental  (+) Edentulous Upper, Edentulous Lower   Pulmonary sleep apnea , Current Smoker,    breath sounds clear to auscultation       Cardiovascular hypertension, Pt. on medications + CAD, + Past MI, + CABG, + Peripheral Vascular Disease and + DOE   Rhythm:Regular Rate:Normal     Neuro/Psych  Neuromuscular disease CVA    GI/Hepatic GERD  ,  Endo/Other  diabetes, Well Controlled, Type 2, Insulin Dependent, Oral Hypoglycemic AgentsMorbid obesity  Renal/GU      Musculoskeletal   Abdominal (+) + obese,   Peds  Hematology   Anesthesia Other Findings   Reproductive/Obstetrics                            Anesthesia Physical Anesthesia Plan  ASA: III  Anesthesia Plan: MAC   Post-op Pain Management:    Induction: Intravenous  Airway Management Planned: Simple Face Mask  Additional Equipment:   Intra-op Plan:   Post-operative Plan: Extubation in OR  Informed Consent: I have reviewed the patients History and Physical, chart, labs and discussed the procedure including the risks, benefits and alternatives for the proposed anesthesia with the patient or authorized representative who has indicated his/her understanding and acceptance.     Plan Discussed with:   Anesthesia Plan Comments:        Anesthesia Quick Evaluation

## 2015-08-21 NOTE — OR Nursing (Signed)
Blood pressures  In pre op reported to dr. Patsey Berthold.  Orders  Given .  Robinol given per order.

## 2015-08-21 NOTE — Anesthesia Postprocedure Evaluation (Signed)
  Anesthesia Post-op Note  Patient: [name]  Procedure(s) Performed: Procedure(s): CIRCUMCISION ADULT (N/A)  Patient Location: PACU  Anesthesia Type:General  Level of Consciousness: awake, alert , oriented and patient cooperative  Airway and Oxygen Therapy: Patient Spontanous Breathing  Post-op Pain: 2 /10, mild  Post-op Assessment: Post-op Vital signs reviewed, Patient's Cardiovascular Status Stable, Respiratory Function Stable, Patent Airway and Pain level controlled              Post-op Vital Signs: Reviewed and stable  Last Vitals:  Filed Vitals:   08/21/15 1210  BP: 77/43  Pulse: 54  Temp: 36.2 C  Resp: 14    Complications: No apparent anesthesia complications

## 2015-08-21 NOTE — Transfer of Care (Signed)
Immediate Anesthesia Transfer of Care Note  Patient: [name]  Procedure(s) Performed: Procedure(s): CIRCUMCISION ADULT (N/A)  Patient Location: PACU  Anesthesia Type:MAC  Level of Consciousness: awake and patient cooperative  Airway & Oxygen Therapy: Patient Spontanous Breathing and Patient connected to face mask oxygen  Post-op Assessment: Report given to RN, Post -op Vital signs reviewed and stable and Patient moving all extremities  Post vital signs: Reviewed and stable  Last Vitals:  Filed Vitals:   08/21/15 1210  BP: 77/43  Pulse: 54  Temp: 36.2 C  Resp: 14    Complications: No apparent anesthesia complications
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
## 2015-08-21 NOTE — Brief Op Note (Signed)
08/21/2015  1:45 PM  PATIENT:  [name]  64 y.o. male  PRE-OPERATIVE DIAGNOSIS:  PHIMOSIS  POST-OPERATIVE DIAGNOSIS:  PHIMOSIS  PROCEDURE:  Procedure(s): CIRCUMCISION ADULT (N/A)  SURGEON:  Surgeon(s) and Role:    * Cleon Gustin, MD - Primary  PHYSICIAN ASSISTANT:   ASSISTANTS: none   ANESTHESIA:   local and IV sedation  EBL:  Total I/O In: 800 [I.V.:800] Out: -   BLOOD ADMINISTERED:none  DRAINS: none   LOCAL MEDICATIONS USED:  NONE  SPECIMEN:  Source of Specimen:  foreskin  DISPOSITION OF SPECIMEN:  PATHOLOGY  COUNTS:  YES  TOURNIQUET:  * No tourniquets in log *  DICTATION: .Note written in EPIC  PLAN OF CARE: Discharge to home after PACU  PATIENT DISPOSITION:  PACU - hemodynamically stable.   Delay start of Pharmacological VTE agent (>24hrs) due to surgical blood loss or risk of bleeding: not applicable

## 2015-08-22 ENCOUNTER — Encounter (HOSPITAL_COMMUNITY): Payer: Self-pay | Admitting: Urology

## 2015-08-22 ENCOUNTER — Ambulatory Visit: Payer: Medicaid Other | Admitting: Orthopedic Surgery

## 2015-08-27 ENCOUNTER — Telehealth: Payer: Self-pay | Admitting: *Deleted

## 2015-08-27 NOTE — Telephone Encounter (Signed)
[name] patient's wife called stating patient done his x-ray 08/19/15.

## 2015-08-28 NOTE — Op Note (Signed)
Preoperative diagnosis: phimosis  Postoperative diagnosis: Same  Procedure: circumcision  Attending: Nicolette Bang, MD  Anesthesia: MAC  History of blood loss: Minimal  Antibiotics: ancef  Drains: none  Specimens: foreskin  Findings: dense phimotic ring. No masses/lesions on glans or foreskin.  Indications: Patient is a 64 year old male with a history of phimosis, difficulty urinating, and recurrent balanoprothitis  After discussing treatment options he decided to proceed with circumcision   Procedure in detail: Prior to procedure consent was obtained.  Patient was brought to the operating room and a brief timeout was done to ensure correct patient, correct procedure, correct site.  MAC anesthesia was administered and patient was placed in supine position.  His genitalia and abdomen was then prepped and draped in usual sterile fashion.  The phimotic ring was incised sharply and the foreskin was then retracted. An circumferential incision was made 1.5cm below the coronal ridge. We then pulled the foreskin over the glans and made a separate circumferential incision at the level of the coronal ridge. We then sharply incised the foreskin and then freed it from the dartos using electrocautery. We then sent the foreskin for pathology. Individual bleeders were then cauterized and good hemostasis was noted. We then reapproximated the penile skin to the subcoronal incision. We used interrupted 3-0 vicryl to close the incision. Once this was complete we applied a gauze bandage and this then concluded the procedure which was well tolerated by the patient.  Complications: None  Condition: Stable, extubated, transferred to PACU.  Plan: Patient is to be discharged home.  He is to followup in 2 weeks for a wound check

## 2015-08-29 ENCOUNTER — Encounter: Payer: Self-pay | Admitting: Orthopedic Surgery

## 2015-08-29 ENCOUNTER — Ambulatory Visit (INDEPENDENT_AMBULATORY_CARE_PROVIDER_SITE_OTHER): Payer: Medicaid Other | Admitting: Orthopedic Surgery

## 2015-08-29 VITALS — BP 113/63 | Ht 67.0 in | Wt 302.0 lb

## 2015-08-29 DIAGNOSIS — S42202G Unspecified fracture of upper end of left humerus, subsequent encounter for fracture with delayed healing: Secondary | ICD-10-CM

## 2015-08-29 NOTE — Progress Notes (Signed)
Chief Complaint  Patient presents with  . Follow-up    1 month follow up Left proximal humerus fx, DOI approx 05/21/15    Patient's x-ray was done at the hospital due to positioning issues and his mobility. 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 The fractures approximately 74 weeks old with refracture through her previous fracture and it is too early to call with a nonunion at this point delayed union perhaps may be more appropriate  Normal fracture healing time for humerus fracture is approximately 12 weeks all things being equal  We started him on a bone stimulator which is helping. Clinically his arm moved as one unit so he does have some type of uniting going on there we will continue with that as well  Recommend continue fracture cuff and follow-up in a month with new x-rays at the hospital.

## 2015-09-04 ENCOUNTER — Ambulatory Visit (INDEPENDENT_AMBULATORY_CARE_PROVIDER_SITE_OTHER): Payer: Self-pay | Admitting: Urology

## 2015-09-04 ENCOUNTER — Telehealth: Payer: Self-pay | Admitting: "Endocrinology

## 2015-09-04 DIAGNOSIS — N481 Balanitis: Secondary | ICD-10-CM

## 2015-09-04 NOTE — Telephone Encounter (Signed)
Wife called asking if we had sent the informaiton for [name]'s diabetic shoes to fax number [phone]

## 2015-09-04 NOTE — Telephone Encounter (Signed)
Pt notified that we do not have any forms for DM shoes. I advised them to have the company that they get these from to fax the form to [place].

## 2015-09-11 ENCOUNTER — Telehealth: Payer: Self-pay | Admitting: *Deleted

## 2015-09-11 ENCOUNTER — Other Ambulatory Visit: Payer: Self-pay | Admitting: *Deleted

## 2015-09-11 ENCOUNTER — Telehealth: Payer: Self-pay | Admitting: "Endocrinology

## 2015-09-11 MED ORDER — OXYCODONE-ACETAMINOPHEN 10-325 MG PO TABS: 1.0000 | ORAL_TABLET | ORAL | Status: DC | PRN

## 2015-09-11 NOTE — Telephone Encounter (Signed)
Patient's wife [name] called stating patient needs his percocet refilled. Please advise

## 2015-09-11 NOTE — Telephone Encounter (Signed)
Patient needs a prescription for diabetic shoes sent to [employer]

## 2015-09-11 NOTE — Telephone Encounter (Signed)
He will need a foot exam by podiatry. Advise him to see his foot doctor. If he does not have one we will refer him.

## 2015-09-12 NOTE — Telephone Encounter (Signed)
Prescription available, called patient, no answer 

## 2015-09-16 NOTE — Telephone Encounter (Signed)
Left message for pt to call back. Will schedule appt when pt calls back.

## 2015-09-17 NOTE — Telephone Encounter (Signed)
Patient collocted Rx 09/13/15

## 2015-09-18 ENCOUNTER — Telehealth: Payer: Self-pay | Admitting: "Endocrinology

## 2015-09-18 NOTE — Telephone Encounter (Signed)
[name] has been trying to get diabetic shoes for her husband. I called [facility] and they said that [name] has to call them and write a prescription. [name] is calling [facility] to get a prescription.

## 2015-09-24 ENCOUNTER — Other Ambulatory Visit: Payer: Self-pay | Admitting: "Endocrinology

## 2015-09-24 ENCOUNTER — Telehealth: Payer: Self-pay

## 2015-09-24 ENCOUNTER — Other Ambulatory Visit: Payer: Self-pay | Admitting: Physician Assistant

## 2015-09-24 NOTE — Telephone Encounter (Signed)
Attempted to contact pt.  Left voice message re: recommendation by Dr. Scot Dock to d/c Plavix; encouraged to call office if any questions.

## 2015-09-24 NOTE — Telephone Encounter (Signed)
-----   Message from Angelia Mould, MD sent at 09/24/2015 12:06 PM EST ----- Regarding: RE: Plavix question Does not need Plavix any more. Thanks Gerald Stabs  ----- Message -----    From: Denman George, RN    Sent: 09/24/2015  10:48 AM      To: Angelia Mould, MD Subject: Plavix question                                s/p (R) SFA PTA and Stent 04/08/15; do you want him to continue Plavix 75 mg qd?  He was initially prescribed refills x 5.  The last office note stated pt. on maximum medical management with ASA and a statin.  Please advise.

## 2015-09-27 ENCOUNTER — Other Ambulatory Visit: Payer: Self-pay | Admitting: Orthopedic Surgery

## 2015-09-27 ENCOUNTER — Ambulatory Visit (HOSPITAL_COMMUNITY)
Admission: RE | Admit: 2015-09-27 | Discharge: 2015-09-27 | Disposition: A | Discharge status: Home / Self Care | Payer: Medicaid Other | Source: Ambulatory Visit | Attending: Orthopedic Surgery | Admitting: Orthopedic Surgery

## 2015-09-27 DIAGNOSIS — S42302A Unspecified fracture of shaft of humerus, left arm, initial encounter for closed fracture: Secondary | ICD-10-CM

## 2015-09-27 DIAGNOSIS — S42202K Unspecified fracture of upper end of left humerus, subsequent encounter for fracture with nonunion: Secondary | ICD-10-CM | POA: Insufficient documentation

## 2015-09-27 DIAGNOSIS — X58XXXD Exposure to other specified factors, subsequent encounter: Secondary | ICD-10-CM | POA: Insufficient documentation

## 2015-09-30 ENCOUNTER — Ambulatory Visit (INDEPENDENT_AMBULATORY_CARE_PROVIDER_SITE_OTHER): Payer: Self-pay | Admitting: Orthopedic Surgery

## 2015-09-30 VITALS — BP 106/59 | Ht 67.0 in | Wt 302.0 lb

## 2015-09-30 DIAGNOSIS — S42202G Unspecified fracture of upper end of left humerus, subsequent encounter for fracture with delayed healing: Secondary | ICD-10-CM

## 2015-09-30 MED ORDER — OXYCODONE-ACETAMINOPHEN 10-325 MG PO TABS: 1.0000 | ORAL_TABLET | ORAL | Status: DC | PRN

## 2015-09-30 NOTE — Progress Notes (Signed)
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

## 2015-10-25 ENCOUNTER — Other Ambulatory Visit: Payer: Self-pay | Admitting: "Endocrinology

## 2015-10-31 ENCOUNTER — Ambulatory Visit: Payer: Medicaid Other | Admitting: "Endocrinology

## 2015-10-31 ENCOUNTER — Other Ambulatory Visit: Payer: Self-pay | Admitting: "Endocrinology

## 2015-10-31 ENCOUNTER — Ambulatory Visit (HOSPITAL_COMMUNITY)
Admission: RE | Admit: 2015-10-31 | Discharge: 2015-10-31 | Disposition: A | Discharge status: Home / Self Care | Payer: Medicaid Other | Source: Ambulatory Visit | Attending: Orthopedic Surgery | Admitting: Orthopedic Surgery

## 2015-10-31 ENCOUNTER — Other Ambulatory Visit: Payer: Self-pay | Admitting: Orthopedic Surgery

## 2015-10-31 DIAGNOSIS — X58XXXA Exposure to other specified factors, initial encounter: Secondary | ICD-10-CM | POA: Diagnosis not present

## 2015-10-31 DIAGNOSIS — S42202A Unspecified fracture of upper end of left humerus, initial encounter for closed fracture: Secondary | ICD-10-CM | POA: Diagnosis not present

## 2015-10-31 DIAGNOSIS — T148XXA Other injury of unspecified body region, initial encounter: Secondary | ICD-10-CM

## 2015-10-31 DIAGNOSIS — S279XXA Injury of unspecified intrathoracic organ, initial encounter: Secondary | ICD-10-CM | POA: Insufficient documentation

## 2015-10-31 DIAGNOSIS — S2242XA Multiple fractures of ribs, left side, initial encounter for closed fracture: Secondary | ICD-10-CM | POA: Insufficient documentation

## 2015-10-31 LAB — BASIC METABOLIC PANEL
BUN: 16 mg/dL (ref 7–25)
CHLORIDE: 100 mmol/L (ref 98–110)
CO2: 25 mmol/L (ref 20–31)
CREATININE: 0.92 mg/dL (ref 0.70–1.25)
Calcium: 8.8 mg/dL (ref 8.6–10.3)
Glucose, Bld: 203 mg/dL — ABNORMAL HIGH (ref 65–99)
POTASSIUM: 4.4 mmol/L (ref 3.5–5.3)
Sodium: 137 mmol/L (ref 135–146)

## 2015-11-01 LAB — HEMOGLOBIN A1C
HEMOGLOBIN A1C: 7.8 % — AB (ref <5.7)
MEAN PLASMA GLUCOSE: 177 mg/dL — AB (ref <117)

## 2015-11-04 ENCOUNTER — Ambulatory Visit (INDEPENDENT_AMBULATORY_CARE_PROVIDER_SITE_OTHER): Payer: Medicaid Other | Admitting: Orthopedic Surgery

## 2015-11-04 ENCOUNTER — Other Ambulatory Visit: Payer: Self-pay | Admitting: "Endocrinology

## 2015-11-04 VITALS — BP 113/46 | Ht 67.0 in | Wt 302.0 lb

## 2015-11-04 DIAGNOSIS — S42202G Unspecified fracture of upper end of left humerus, subsequent encounter for fracture with delayed healing: Secondary | ICD-10-CM

## 2015-11-04 MED ORDER — OXYCODONE-ACETAMINOPHEN 10-325 MG PO TABS: 1.0000 | ORAL_TABLET | ORAL | Status: DC | PRN

## 2015-11-04 NOTE — Progress Notes (Signed)
Patient ID: [name], male   DOB: [birth year], 65 y.o.   MRN: [id number]  Chief Complaint  Patient presents with  . Follow-up    5 week follow up Left proximal humerus fx, DOI 05/21/15    BP 113/46 mmHg  Ht 5\' 7"  (1.702 m)  Wt 302 lb (136.986 kg)  BMI 47.29 kg/m2  Proximal humerus refracture fibrous nonunion clinically fracture moved as one unit x-ray was done at the hospital angulation at the fracture site callus starting to form. We are using a bone stimulator as well.  Review of systems negative  ASSESSMENT AND PLAN   Follow-up a month continue fracture brace refill pain medicine xrays at aph

## 2015-11-20 ENCOUNTER — Ambulatory Visit (INDEPENDENT_AMBULATORY_CARE_PROVIDER_SITE_OTHER): Payer: Medicaid Other | Admitting: "Endocrinology

## 2015-11-20 ENCOUNTER — Encounter: Payer: Self-pay | Admitting: "Endocrinology

## 2015-11-20 VITALS — BP 135/74 | HR 60 | Ht 67.0 in

## 2015-11-20 DIAGNOSIS — E785 Hyperlipidemia, unspecified: Secondary | ICD-10-CM | POA: Diagnosis not present

## 2015-11-20 DIAGNOSIS — E1159 Type 2 diabetes mellitus with other circulatory complications: Secondary | ICD-10-CM | POA: Diagnosis not present

## 2015-11-20 DIAGNOSIS — I10 Essential (primary) hypertension: Secondary | ICD-10-CM

## 2015-11-20 DIAGNOSIS — Z9189 Other specified personal risk factors, not elsewhere classified: Secondary | ICD-10-CM | POA: Insufficient documentation

## 2015-11-20 NOTE — Patient Instructions (Signed)
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
## 2015-12-04 ENCOUNTER — Ambulatory Visit: Payer: Medicaid Other | Admitting: Urology

## 2015-12-06 ENCOUNTER — Ambulatory Visit (HOSPITAL_COMMUNITY)
Admission: RE | Admit: 2015-12-06 | Discharge: 2015-12-06 | Disposition: A | Discharge status: Home / Self Care | Payer: Medicaid Other | Source: Ambulatory Visit | Attending: Orthopedic Surgery | Admitting: Orthopedic Surgery

## 2015-12-06 ENCOUNTER — Other Ambulatory Visit: Payer: Self-pay | Admitting: Orthopedic Surgery

## 2015-12-06 DIAGNOSIS — M2548 Effusion, other site: Secondary | ICD-10-CM | POA: Insufficient documentation

## 2015-12-06 DIAGNOSIS — S42202D Unspecified fracture of upper end of left humerus, subsequent encounter for fracture with routine healing: Secondary | ICD-10-CM | POA: Insufficient documentation

## 2015-12-06 DIAGNOSIS — S42302A Unspecified fracture of shaft of humerus, left arm, initial encounter for closed fracture: Secondary | ICD-10-CM

## 2015-12-06 DIAGNOSIS — X58XXXD Exposure to other specified factors, subsequent encounter: Secondary | ICD-10-CM | POA: Diagnosis not present

## 2015-12-09 ENCOUNTER — Encounter: Payer: Self-pay | Admitting: Orthopedic Surgery

## 2015-12-09 ENCOUNTER — Ambulatory Visit (INDEPENDENT_AMBULATORY_CARE_PROVIDER_SITE_OTHER): Payer: Medicaid Other | Admitting: Orthopedic Surgery

## 2015-12-09 VITALS — BP 99/46 | Ht 67.0 in | Wt 302.0 lb

## 2015-12-09 DIAGNOSIS — S42202G Unspecified fracture of upper end of left humerus, subsequent encounter for fracture with delayed healing: Secondary | ICD-10-CM | POA: Diagnosis not present

## 2015-12-09 MED ORDER — OXYCODONE-ACETAMINOPHEN 10-325 MG PO TABS: 1.0000 | ORAL_TABLET | ORAL | Status: DC | PRN

## 2015-12-09 NOTE — Progress Notes (Signed)
Patient ID: [name], male   DOB: 1951-05-26, 65 y.o.   MRN: [id number]  Chief Complaint  Patient presents with  . Follow-up    FOLLOW UP LEFT HUMERUS FX, xray prior, DOI 05/21/15    BP 99/46 mmHg  Ht 5\' 7"  (1.702 m)  Wt 302 lb (136.986 kg)  BMI 47.29 kg/m2  No motion at the fracture site patient complains of tenderness and pain is got a bone stimulator and a fracture cuff x-ray shows partial healing of this proximal humerus fracture after refracture through her previous fracture.    ASSESSMENT AND PLAN   Refill pain medicine x-ray again 1 month to x-ray prior to visit

## 2015-12-18 ENCOUNTER — Ambulatory Visit (INDEPENDENT_AMBULATORY_CARE_PROVIDER_SITE_OTHER): Payer: Medicaid Other | Admitting: Urology

## 2015-12-18 DIAGNOSIS — R3915 Urgency of urination: Secondary | ICD-10-CM

## 2015-12-18 DIAGNOSIS — R350 Frequency of micturition: Secondary | ICD-10-CM

## 2015-12-24 ENCOUNTER — Other Ambulatory Visit: Payer: Self-pay | Admitting: "Endocrinology

## 2016-01-06 ENCOUNTER — Ambulatory Visit (HOSPITAL_COMMUNITY)
Admission: RE | Admit: 2016-01-06 | Discharge: 2016-01-06 | Disposition: A | Discharge status: Home / Self Care | Payer: Medicaid Other | Source: Ambulatory Visit | Attending: Orthopedic Surgery | Admitting: Orthopedic Surgery

## 2016-01-06 ENCOUNTER — Encounter: Payer: Self-pay | Admitting: Orthopedic Surgery

## 2016-01-06 ENCOUNTER — Ambulatory Visit (INDEPENDENT_AMBULATORY_CARE_PROVIDER_SITE_OTHER): Payer: Medicaid Other | Admitting: Orthopedic Surgery

## 2016-01-06 VITALS — BP 95/49 | Ht 67.0 in | Wt 302.0 lb

## 2016-01-06 DIAGNOSIS — S42202G Unspecified fracture of upper end of left humerus, subsequent encounter for fracture with delayed healing: Secondary | ICD-10-CM

## 2016-01-06 DIAGNOSIS — X58XXXD Exposure to other specified factors, subsequent encounter: Secondary | ICD-10-CM | POA: Diagnosis not present

## 2016-01-06 DIAGNOSIS — S6000XA Contusion of unspecified finger without damage to nail, initial encounter: Secondary | ICD-10-CM

## 2016-01-06 DIAGNOSIS — G894 Chronic pain syndrome: Secondary | ICD-10-CM | POA: Diagnosis not present

## 2016-01-06 MED ORDER — OXYCODONE-ACETAMINOPHEN 10-325 MG PO TABS: 1.0000 | ORAL_TABLET | ORAL | Status: DC | PRN

## 2016-01-06 NOTE — Progress Notes (Signed)
Patient ID: [name], male   DOB: 01/20/1951, 65 y.o.   MRN: [id number]  Chief Complaint  Patient presents with  . Follow-up    FOLLOW UP LEFT PROXIMAL HUMERUS FRACTURE, DOI 05/21/15    HPI malunion fibrous union left proximal humerus fracture treated with fracture cuff  ROS pain left finger index finger  BP 95/49 mmHg  Ht 5\' 7"  (1.702 m)  Wt 302 lb (136.986 kg)  BMI 47.29 kg/m2  Physical Exam  Constitutional: He is oriented to person, place, and time. He appears well-developed and well-nourished. No distress.  Cardiovascular: Normal rate and intact distal pulses.   Musculoskeletal:  Left upper extremity has a flexion contracture at the elbow wrist and hand contracture secondary to prior stroke tenderness is noted over the DIP and PIP joint of the index finger with swelling no crepitance no malalignment capillary refill is excellent.  Neurological: He is alert and oriented to person, place, and time.  Skin: Skin is warm and dry. No rash noted. He is not diaphoretic. No erythema. No pallor.  Psychiatric: He has a normal mood and affect. His behavior is normal. Judgment and thought content normal.   shoulder humerus moves as one unit. No swelling. Because of the size of the arm he can tell there is apex lateral angulation. X-ray shows probable fibrous union proximal humerus  Ortho Exam  Tenderness over the DIP joint of his left index finger secondary to trauma  ASSESSMENT AND PLAN  Chronic pain Fibrous malunion left proximal humerus  Contusion left index finger  The patient willf/u with Dr Luna Glasgow for chronic pain

## 2016-01-07 ENCOUNTER — Encounter: Payer: Self-pay | Admitting: Vascular Surgery

## 2016-01-15 ENCOUNTER — Ambulatory Visit (HOSPITAL_COMMUNITY): Payer: Medicaid Other

## 2016-01-15 ENCOUNTER — Ambulatory Visit: Payer: Medicaid Other | Admitting: Vascular Surgery

## 2016-01-22 ENCOUNTER — Ambulatory Visit (INDEPENDENT_AMBULATORY_CARE_PROVIDER_SITE_OTHER): Payer: Medicaid Other | Admitting: Urology

## 2016-01-22 ENCOUNTER — Other Ambulatory Visit: Payer: Self-pay | Admitting: Cardiology

## 2016-01-22 DIAGNOSIS — R351 Nocturia: Secondary | ICD-10-CM | POA: Diagnosis not present

## 2016-01-22 DIAGNOSIS — R3915 Urgency of urination: Secondary | ICD-10-CM

## 2016-01-22 NOTE — Telephone Encounter (Signed)
Rx(s) sent to pharmacy electronically.  

## 2016-02-04 ENCOUNTER — Encounter: Payer: Self-pay | Admitting: Orthopedic Surgery

## 2016-02-04 ENCOUNTER — Ambulatory Visit (INDEPENDENT_AMBULATORY_CARE_PROVIDER_SITE_OTHER): Payer: Medicaid Other | Admitting: Orthopedic Surgery

## 2016-02-04 ENCOUNTER — Telehealth: Payer: Self-pay | Admitting: *Deleted

## 2016-02-04 ENCOUNTER — Ambulatory Visit: Payer: Medicaid Other | Admitting: Orthopaedic Surgery

## 2016-02-04 VITALS — BP 114/68 | Ht 67.0 in | Wt 302.0 lb

## 2016-02-04 DIAGNOSIS — G894 Chronic pain syndrome: Secondary | ICD-10-CM | POA: Diagnosis not present

## 2016-02-04 DIAGNOSIS — S42202G Unspecified fracture of upper end of left humerus, subsequent encounter for fracture with delayed healing: Secondary | ICD-10-CM

## 2016-02-04 MED ORDER — OXYCODONE-ACETAMINOPHEN 10-325 MG PO TABS: 1.0000 | ORAL_TABLET | ORAL | Status: DC | PRN

## 2016-02-04 NOTE — Progress Notes (Signed)
Patient ID: [name], male   DOB: 12-19-1950, 65 y.o.   MRN: [id number]  Chief Complaint  Patient presents with  . Follow-up    follow up left proximal humerus fx, DOI 05/21/15    HPI-released from care but came back and was switched to my schedule. Followed by Dr Luna Glasgow for chronic pain management prior to fracture. Also received left shoulder injections for chronic shoulder pain non-operable.  ROS-neuro: no numbness  BP 114/68 mmHg  Ht 5\' 7"  (1.702 m)  Wt 302 lb (136.986 kg)  BMI 47.29 kg/m2  Physical Exam  Constitutional: He is oriented to person, place, and time. He appears well-developed and well-nourished. No distress.  Cardiovascular: Normal rate and intact distal pulses.   Neurological: He is alert and oriented to person, place, and time.  Skin: Skin is warm and dry. No rash noted. He is not diaphoretic. No erythema. No pallor.  Psychiatric: He has a normal mood and affect. His behavior is normal. Judgment and thought content normal.    Ortho Exam Peri-acromial tenderness left shoulder;  Lymph nodes are normal in the axilla Pronation IR contracture left shoulder and arm   ASSESSMENT AND PLAN   Chronic pain left shoulder  Refracture and fibrous union left humeral shaft fracture   Procedure note the subacromial injection shoulder left   Verbal consent was obtained to inject the  Left   Shoulder  Timeout was completed to confirm the injection site is a subacromial space of the  left  shoulder  Medication used Depo-Medrol 40 mg and lidocaine 1% 3 cc  Anesthesia was provided by ethyl chloride  The injection was performed in the left  posterior subacromial space. After pinning the skin with alcohol and anesthetized the skin with ethyl chloride the subacromial space was injected using a 20-gauge needle. There were no complications  Sterile dressing was applied.

## 2016-02-04 NOTE — Patient Instructions (Signed)
Referral to pain management   I will not write anymore pain medication prescriptions for you

## 2016-02-04 NOTE — Telephone Encounter (Signed)
Faxed office notes and request for referral to pain management. Due to patient having [employer], they have to make the referral.

## 2016-02-12 ENCOUNTER — Telehealth: Payer: Self-pay | Admitting: Orthopedic Surgery

## 2016-02-12 NOTE — Telephone Encounter (Signed)
[facility] called asking about pain management. He hasn't heard from anybody he said.  Please give him a call regarding this.

## 2016-02-14 ENCOUNTER — Other Ambulatory Visit: Payer: Self-pay | Admitting: "Endocrinology

## 2016-02-14 LAB — LIPID PANEL
CHOL/HDL RATIO: 3.6 ratio (ref <=5.0)
Cholesterol: 136 mg/dL (ref 125–200)
HDL: 38 mg/dL — ABNORMAL LOW (ref >=40)
LDL Cholesterol: 65 mg/dL (ref <130)
Triglycerides: 165 mg/dL — ABNORMAL HIGH (ref <150)
VLDL: 33 mg/dL — AB (ref <30)

## 2016-02-14 LAB — BASIC METABOLIC PANEL
BUN: 14 mg/dL (ref 7–25)
CALCIUM: 8.7 mg/dL (ref 8.6–10.3)
CO2: 26 mmol/L (ref 20–31)
Chloride: 103 mmol/L (ref 98–110)
Creat: 1.02 mg/dL (ref 0.70–1.25)
GLUCOSE: 110 mg/dL — AB (ref 65–99)
Potassium: 4.6 mmol/L (ref 3.5–5.3)
SODIUM: 139 mmol/L (ref 135–146)

## 2016-02-14 LAB — HEMOGLOBIN A1C
Hgb A1c MFr Bld: 9.1 % — ABNORMAL HIGH (ref <5.7)
Mean Plasma Glucose: 214 mg/dL

## 2016-02-15 LAB — T4, FREE: FREE T4: 1.6 ng/dL (ref 0.8–1.8)

## 2016-02-15 LAB — TSH: TSH: 3.46 m[IU]/L (ref 0.40–4.50)

## 2016-02-19 NOTE — Telephone Encounter (Signed)
Spoke with patient's wife and advised that referral would need to come from his primary doctor.

## 2016-02-20 ENCOUNTER — Encounter: Payer: Self-pay | Admitting: "Endocrinology

## 2016-02-20 ENCOUNTER — Ambulatory Visit (INDEPENDENT_AMBULATORY_CARE_PROVIDER_SITE_OTHER): Payer: Self-pay | Admitting: "Endocrinology

## 2016-02-20 VITALS — BP 124/82 | HR 59

## 2016-02-20 DIAGNOSIS — E1159 Type 2 diabetes mellitus with other circulatory complications: Secondary | ICD-10-CM

## 2016-02-20 DIAGNOSIS — I10 Essential (primary) hypertension: Secondary | ICD-10-CM

## 2016-02-20 DIAGNOSIS — E785 Hyperlipidemia, unspecified: Secondary | ICD-10-CM

## 2016-02-20 MED ORDER — CANAGLIFLOZIN 100 MG PO TABS: 100.0000 mg | ORAL_TABLET | Freq: Every day | ORAL | Status: DC

## 2016-02-20 NOTE — Patient Instructions (Signed)
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
## 2016-02-21 ENCOUNTER — Other Ambulatory Visit: Payer: Self-pay

## 2016-02-21 MED ORDER — METFORMIN HCL 1000 MG PO TABS: 1000.0000 mg | ORAL_TABLET | Freq: Two times a day (BID) | ORAL | Status: DC

## 2016-02-24 ENCOUNTER — Telehealth: Payer: Self-pay | Admitting: Orthopedic Surgery

## 2016-02-24 NOTE — Telephone Encounter (Signed)
Repeat xrays and come back in

## 2016-02-24 NOTE — Telephone Encounter (Signed)
ROUTING TO DR HARRISON FOR REVIEW 

## 2016-02-24 NOTE — Telephone Encounter (Signed)
[name] called wanting to get an appointment with Dr. Aline Brochure. He is saying he doesn't think his Left arm has healed and would like to see Dr. Aline Brochure. He did say something about the pain clinic he was being sent to was in [place].  Please advise

## 2016-02-25 ENCOUNTER — Ambulatory Visit: Payer: Medicaid Other

## 2016-02-25 ENCOUNTER — Other Ambulatory Visit: Payer: Self-pay | Admitting: *Deleted

## 2016-02-25 DIAGNOSIS — S42202G Unspecified fracture of upper end of left humerus, subsequent encounter for fracture with delayed healing: Secondary | ICD-10-CM

## 2016-02-25 NOTE — Telephone Encounter (Signed)
Please schedule appointment when available and advise xray at [facility] prior.

## 2016-02-28 ENCOUNTER — Telehealth: Payer: Self-pay | Admitting: Orthopedic Surgery

## 2016-02-28 NOTE — Telephone Encounter (Signed)
Oxycodone-Acetaminophen 10/325mg   Qty 150 Tablets  Take 1 tablet by mouth every 4 (hours) as needed for pain

## 2016-03-02 NOTE — Telephone Encounter (Signed)
ROUTING TO DR HARRISON FOR APPROVAL 

## 2016-03-02 NOTE — Telephone Encounter (Signed)
DECLINED

## 2016-03-04 NOTE — Telephone Encounter (Signed)
Patient made aware.

## 2016-03-06 ENCOUNTER — Telehealth: Payer: Self-pay | Admitting: Orthopedic Surgery

## 2016-03-06 NOTE — Telephone Encounter (Signed)
Patient and wife called to relay that patient has been scheduled with pain management at [place], with Dr Brien Few, for appointment 03/19/16; therefore, requesting 1 refill of pain medication, last prescribed 02/04/16, until seen: oxyCODONE-acetaminophen (PERCOCET) 10-325 MG tablet [id number] - please advise.

## 2016-03-10 ENCOUNTER — Telehealth: Payer: Self-pay | Admitting: Radiology

## 2016-03-10 ENCOUNTER — Other Ambulatory Visit: Payer: Self-pay | Admitting: Orthopedic Surgery

## 2016-03-10 DIAGNOSIS — G894 Chronic pain syndrome: Secondary | ICD-10-CM

## 2016-03-10 MED ORDER — OXYCODONE-ACETAMINOPHEN 10-325 MG PO TABS: 1.0000 | ORAL_TABLET | ORAL | Status: DC | PRN

## 2016-03-10 NOTE — Telephone Encounter (Signed)
Prescription available 

## 2016-03-10 NOTE — Telephone Encounter (Signed)
Routing to Dr Harrison for review 

## 2016-03-11 ENCOUNTER — Other Ambulatory Visit: Payer: Self-pay | Admitting: *Deleted

## 2016-03-11 DIAGNOSIS — S42202G Unspecified fracture of upper end of left humerus, subsequent encounter for fracture with delayed healing: Secondary | ICD-10-CM

## 2016-03-12 ENCOUNTER — Ambulatory Visit (HOSPITAL_COMMUNITY)
Admission: RE | Admit: 2016-03-12 | Discharge: 2016-03-12 | Disposition: A | Discharge status: Home / Self Care | Payer: Medicaid Other | Source: Ambulatory Visit | Attending: Orthopedic Surgery | Admitting: Orthopedic Surgery

## 2016-03-12 DIAGNOSIS — M8580 Other specified disorders of bone density and structure, unspecified site: Secondary | ICD-10-CM | POA: Insufficient documentation

## 2016-03-12 DIAGNOSIS — X58XXXD Exposure to other specified factors, subsequent encounter: Secondary | ICD-10-CM | POA: Diagnosis not present

## 2016-03-12 DIAGNOSIS — S42202G Unspecified fracture of upper end of left humerus, subsequent encounter for fracture with delayed healing: Secondary | ICD-10-CM | POA: Diagnosis not present

## 2016-03-16 ENCOUNTER — Ambulatory Visit (INDEPENDENT_AMBULATORY_CARE_PROVIDER_SITE_OTHER): Payer: Medicaid Other | Admitting: Orthopedic Surgery

## 2016-03-16 ENCOUNTER — Encounter: Payer: Self-pay | Admitting: Orthopedic Surgery

## 2016-03-16 VITALS — BP 84/50 | Ht 67.0 in | Wt 302.0 lb

## 2016-03-16 DIAGNOSIS — G894 Chronic pain syndrome: Secondary | ICD-10-CM

## 2016-03-16 DIAGNOSIS — S42202G Unspecified fracture of upper end of left humerus, subsequent encounter for fracture with delayed healing: Secondary | ICD-10-CM

## 2016-03-16 NOTE — Progress Notes (Signed)
Chief Complaint  Patient presents with  . Follow-up    LEFT PROXIMAL HUMERUS FRACTURE, DOI 05/21/15  \ BP 84/50 mmHg  Ht 5\' 7"  (1.702 m)  Wt 302 lb (136.986 kg)  BMI 47.29 kg/m2  Left proximal humerus fracture treated with fracture cuff due to medical condition refracture the humerus placed back in a fracture cuff and we also ordered a bone stimulator. 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 He has chronic pain syndrome prior fracture was managed with Percocet 10 mg 5 tablets a day 150 tablets a month. 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

## 2016-03-18 ENCOUNTER — Other Ambulatory Visit: Payer: Self-pay | Admitting: "Endocrinology

## 2016-03-19 ENCOUNTER — Encounter: Payer: Self-pay | Admitting: Vascular Surgery

## 2016-03-25 ENCOUNTER — Encounter: Payer: Self-pay | Admitting: Vascular Surgery

## 2016-03-25 ENCOUNTER — Ambulatory Visit (HOSPITAL_COMMUNITY)
Admission: RE | Admit: 2016-03-25 | Discharge: 2016-03-25 | Disposition: A | Discharge status: Home / Self Care | Payer: Medicaid Other | Source: Ambulatory Visit | Attending: Vascular Surgery | Admitting: Vascular Surgery

## 2016-03-25 ENCOUNTER — Ambulatory Visit (INDEPENDENT_AMBULATORY_CARE_PROVIDER_SITE_OTHER): Payer: Medicaid Other | Admitting: Vascular Surgery

## 2016-03-25 VITALS — BP 114/73 | HR 66 | Ht 67.0 in | Wt 302.0 lb

## 2016-03-25 DIAGNOSIS — E119 Type 2 diabetes mellitus without complications: Secondary | ICD-10-CM | POA: Diagnosis not present

## 2016-03-25 DIAGNOSIS — G4733 Obstructive sleep apnea (adult) (pediatric): Secondary | ICD-10-CM | POA: Diagnosis not present

## 2016-03-25 DIAGNOSIS — R0989 Other specified symptoms and signs involving the circulatory and respiratory systems: Secondary | ICD-10-CM | POA: Diagnosis present

## 2016-03-25 DIAGNOSIS — I2510 Atherosclerotic heart disease of native coronary artery without angina pectoris: Secondary | ICD-10-CM | POA: Insufficient documentation

## 2016-03-25 DIAGNOSIS — E785 Hyperlipidemia, unspecified: Secondary | ICD-10-CM | POA: Diagnosis not present

## 2016-03-25 DIAGNOSIS — I10 Essential (primary) hypertension: Secondary | ICD-10-CM | POA: Diagnosis not present

## 2016-03-25 DIAGNOSIS — I70209 Unspecified atherosclerosis of native arteries of extremities, unspecified extremity: Secondary | ICD-10-CM | POA: Diagnosis not present

## 2016-03-25 DIAGNOSIS — I6529 Occlusion and stenosis of unspecified carotid artery: Secondary | ICD-10-CM

## 2016-03-25 DIAGNOSIS — I739 Peripheral vascular disease, unspecified: Secondary | ICD-10-CM | POA: Diagnosis not present
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
## 2016-03-30 ENCOUNTER — Other Ambulatory Visit: Payer: Self-pay

## 2016-03-30 DIAGNOSIS — Z95828 Presence of other vascular implants and grafts: Secondary | ICD-10-CM

## 2016-03-30 MED ORDER — CLOPIDOGREL BISULFATE 75 MG PO TABS: 75.0000 mg | ORAL_TABLET | Freq: Every day | ORAL | Status: DC

## 2016-04-01 ENCOUNTER — Other Ambulatory Visit: Payer: Self-pay

## 2016-04-01 MED ORDER — INSULIN DETEMIR 100 UNIT/ML FLEXPEN: 50.0000 [IU] | PEN_INJECTOR | Freq: Every day | SUBCUTANEOUS | Status: DC

## 2016-04-22 ENCOUNTER — Other Ambulatory Visit: Payer: Self-pay | Admitting: "Endocrinology

## 2016-04-22 ENCOUNTER — Ambulatory Visit: Payer: Medicaid Other | Admitting: Urology

## 2016-04-30 ENCOUNTER — Other Ambulatory Visit: Payer: Self-pay | Admitting: *Deleted

## 2016-04-30 ENCOUNTER — Telehealth: Payer: Self-pay | Admitting: Orthopedic Surgery

## 2016-04-30 DIAGNOSIS — S42202G Unspecified fracture of upper end of left humerus, subsequent encounter for fracture with delayed healing: Secondary | ICD-10-CM

## 2016-04-30 NOTE — Telephone Encounter (Signed)
AUG 10TH WILL NEED XRAYS

## 2016-04-30 NOTE — Telephone Encounter (Signed)
Forwarding to [place] for scheduling

## 2016-04-30 NOTE — Telephone Encounter (Signed)
Routing to Dr Aline Brochure for review, I do not think there is anything else we can do

## 2016-04-30 NOTE — Telephone Encounter (Signed)
Patient and wife (designated party contact) called to request another appointment with Dr Aline Brochure about his left arm/shoulder pain recurring; states "feels like something sliding".  Discussed that per most recent office visit, referral made to pain management, which patient states has already been seeing.  Also, patient's insurance is [place] Medicaid, which requires referral from primary care, and patient is now to see a provider at the [place] office, as location in [place] has closed.  Please advise. [phone]

## 2016-05-06 ENCOUNTER — Encounter (HOSPITAL_COMMUNITY): Payer: Medicaid Other

## 2016-05-06 ENCOUNTER — Ambulatory Visit: Payer: Medicaid Other | Admitting: Family

## 2016-05-07 NOTE — Addendum Note (Signed)
Addended by: Reola Calkins on: 05/07/2016 03:25 PM   Modules accepted: Orders

## 2016-05-08 ENCOUNTER — Emergency Department (HOSPITAL_COMMUNITY): Payer: Medicaid Other

## 2016-05-08 ENCOUNTER — Emergency Department (HOSPITAL_COMMUNITY)
Admission: EM | Admit: 2016-05-08 | Discharge: 2016-05-08 | Disposition: A | Discharge status: Home / Self Care | Payer: Medicaid Other | Attending: Emergency Medicine | Admitting: Emergency Medicine

## 2016-05-08 ENCOUNTER — Encounter (HOSPITAL_COMMUNITY): Payer: Self-pay | Admitting: *Deleted

## 2016-05-08 DIAGNOSIS — G894 Chronic pain syndrome: Secondary | ICD-10-CM

## 2016-05-08 DIAGNOSIS — I252 Old myocardial infarction: Secondary | ICD-10-CM | POA: Insufficient documentation

## 2016-05-08 DIAGNOSIS — E785 Hyperlipidemia, unspecified: Secondary | ICD-10-CM | POA: Insufficient documentation

## 2016-05-08 DIAGNOSIS — Z7984 Long term (current) use of oral hypoglycemic drugs: Secondary | ICD-10-CM | POA: Insufficient documentation

## 2016-05-08 DIAGNOSIS — Z8673 Personal history of transient ischemic attack (TIA), and cerebral infarction without residual deficits: Secondary | ICD-10-CM | POA: Diagnosis not present

## 2016-05-08 DIAGNOSIS — F17210 Nicotine dependence, cigarettes, uncomplicated: Secondary | ICD-10-CM | POA: Insufficient documentation

## 2016-05-08 DIAGNOSIS — I10 Essential (primary) hypertension: Secondary | ICD-10-CM | POA: Insufficient documentation

## 2016-05-08 DIAGNOSIS — S42302S Unspecified fracture of shaft of humerus, left arm, sequela: Secondary | ICD-10-CM

## 2016-05-08 DIAGNOSIS — S42202S Unspecified fracture of upper end of left humerus, sequela: Secondary | ICD-10-CM | POA: Diagnosis not present

## 2016-05-08 DIAGNOSIS — Z794 Long term (current) use of insulin: Secondary | ICD-10-CM | POA: Insufficient documentation

## 2016-05-08 DIAGNOSIS — Z79899 Other long term (current) drug therapy: Secondary | ICD-10-CM | POA: Diagnosis not present

## 2016-05-08 DIAGNOSIS — W19XXXS Unspecified fall, sequela: Secondary | ICD-10-CM | POA: Diagnosis not present

## 2016-05-08 DIAGNOSIS — M79602 Pain in left arm: Secondary | ICD-10-CM | POA: Diagnosis present

## 2016-05-08 DIAGNOSIS — E119 Type 2 diabetes mellitus without complications: Secondary | ICD-10-CM | POA: Diagnosis not present

## 2016-05-08 DIAGNOSIS — I2510 Atherosclerotic heart disease of native coronary artery without angina pectoris: Secondary | ICD-10-CM | POA: Insufficient documentation

## 2016-05-08 MED ORDER — KETOROLAC TROMETHAMINE 60 MG/2ML IM SOLN
60.0000 mg | Freq: Once | INTRAMUSCULAR | Status: AC
  Administered 2016-05-08: 60 mg via INTRAMUSCULAR
  Filled 2016-05-08: qty 2

## 2016-05-08 MED ORDER — OXYCODONE-ACETAMINOPHEN 5-325 MG PO TABS
1.0000 | ORAL_TABLET | Freq: Once | ORAL | Status: AC
  Administered 2016-05-08: 1 via ORAL
  Filled 2016-05-08: qty 1

## 2016-05-08 MED ORDER — TRAMADOL HCL 50 MG PO TABS
50.0000 mg | ORAL_TABLET | Freq: Once | ORAL | Status: DC
  Filled 2016-05-08: qty 1

## 2016-05-08 MED ORDER — OXYCODONE-ACETAMINOPHEN 5-325 MG PO TABS: 1.0000 | ORAL_TABLET | Freq: Three times a day (TID) | ORAL | 0 refills | Status: DC | PRN

## 2016-05-08 NOTE — Discharge Instructions (Signed)
Read the information below.   Your x-ray shows a stable humeral fracture. You were prescribed pain medication for relief of pain. Continue to wear your arm cuff.  You are scheduled to see your PCP on Monday with hopes of referral to a pain clinic. Be sure to keep the appointment.  Use the prescribed medication as directed.  Please discuss all new medications with your pharmacist.   Follow up with Dr. Aline Brochure.  You may return to the Emergency Department at any time for worsening condition or any new symptoms that concern you. Return to ED if your symptoms worsen or you develop a fever, joint swelling/warmth/redness.

## 2016-05-08 NOTE — ED Triage Notes (Signed)
Pt comes in for left arm pain. Pt has history of stroke with contracture noted to this arm. Pt see's Dr. Aline Brochure for a Humerus fracture Feb 2016, pt re-fractured area in fall of 2016. Pt states he has had increased pain in this area and was told by Dr. Aline Brochure there was nothing else they could do. Pt denies any new injury.
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
## 2016-05-12 NOTE — Telephone Encounter (Signed)
Done

## 2016-05-21 ENCOUNTER — Encounter: Payer: Self-pay | Admitting: Orthopedic Surgery

## 2016-05-21 ENCOUNTER — Ambulatory Visit (INDEPENDENT_AMBULATORY_CARE_PROVIDER_SITE_OTHER): Payer: Medicaid Other | Admitting: Orthopedic Surgery

## 2016-05-21 VITALS — BP 98/52 | HR 58 | Temp 97.5°F

## 2016-05-21 DIAGNOSIS — S42202G Unspecified fracture of upper end of left humerus, subsequent encounter for fracture with delayed healing: Secondary | ICD-10-CM

## 2016-05-21 DIAGNOSIS — G894 Chronic pain syndrome: Secondary | ICD-10-CM | POA: Diagnosis not present

## 2016-05-21 NOTE — Progress Notes (Signed)
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

## 2016-05-22 ENCOUNTER — Other Ambulatory Visit: Payer: Self-pay | Admitting: Cardiology

## 2016-05-22 NOTE — Telephone Encounter (Signed)
REFILL 

## 2016-05-26 ENCOUNTER — Other Ambulatory Visit: Payer: Self-pay | Admitting: *Deleted

## 2016-05-26 DIAGNOSIS — S42202D Unspecified fracture of upper end of left humerus, subsequent encounter for fracture with routine healing: Secondary | ICD-10-CM

## 2016-06-02 ENCOUNTER — Other Ambulatory Visit: Payer: Self-pay | Admitting: "Endocrinology

## 2016-06-03 ENCOUNTER — Other Ambulatory Visit: Payer: Self-pay | Admitting: "Endocrinology

## 2016-06-03 LAB — COMPLETE METABOLIC PANEL WITH GFR
ALK PHOS: 82 U/L (ref 40–115)
ALT: 13 U/L (ref 9–46)
AST: 13 U/L (ref 10–35)
Albumin: 3.7 g/dL (ref 3.6–5.1)
BILIRUBIN TOTAL: 0.3 mg/dL (ref 0.2–1.2)
BUN: 12 mg/dL (ref 7–25)
CALCIUM: 9 mg/dL (ref 8.6–10.3)
CO2: 26 mmol/L (ref 20–31)
CREATININE: 1.06 mg/dL (ref 0.70–1.25)
Chloride: 105 mmol/L (ref 98–110)
GFR, EST AFRICAN AMERICAN: 85 mL/min (ref >=60)
GFR, EST NON AFRICAN AMERICAN: 74 mL/min (ref >=60)
Glucose, Bld: 157 mg/dL — ABNORMAL HIGH (ref 65–99)
Potassium: 4.5 mmol/L (ref 3.5–5.3)
Sodium: 138 mmol/L (ref 135–146)
TOTAL PROTEIN: 6.8 g/dL (ref 6.1–8.1)

## 2016-06-04 LAB — HEMOGLOBIN A1C
HEMOGLOBIN A1C: 8.5 % — AB (ref <5.7)
MEAN PLASMA GLUCOSE: 197 mg/dL

## 2016-06-10 ENCOUNTER — Encounter: Payer: Self-pay | Admitting: "Endocrinology

## 2016-06-10 ENCOUNTER — Ambulatory Visit (INDEPENDENT_AMBULATORY_CARE_PROVIDER_SITE_OTHER): Payer: Self-pay | Admitting: "Endocrinology

## 2016-06-10 VITALS — BP 132/80 | HR 86 | Ht 67.0 in

## 2016-06-10 DIAGNOSIS — E785 Hyperlipidemia, unspecified: Secondary | ICD-10-CM

## 2016-06-10 DIAGNOSIS — Z9189 Other specified personal risk factors, not elsewhere classified: Secondary | ICD-10-CM

## 2016-06-10 DIAGNOSIS — E1159 Type 2 diabetes mellitus with other circulatory complications: Secondary | ICD-10-CM

## 2016-06-10 DIAGNOSIS — F17200 Nicotine dependence, unspecified, uncomplicated: Secondary | ICD-10-CM

## 2016-06-10 DIAGNOSIS — I10 Essential (primary) hypertension: Secondary | ICD-10-CM

## 2016-06-10 NOTE — Patient Instructions (Signed)
Advice for weight management -For most of us the best way to lose weight is by diet management. Generally speaking, diet management means restricting carbohydrate consumption to minimum possible (and to unprocessed or minimally processed complex starch) and increasing protein intake (animal or plant source), fruits, and vegetables.  -Sticking to a routine mealtime to eat 3 meals a day and avoiding unnecessary snacks is shown to have a big role in weight control.  -It is better to avoid simple carbohydrates including: Cakes, Desserts, Ice Cream, Soda (diet and regular), Sweet Tea, Candies, Chips, Cookies, Artificial Sweeteners, and "Sugar-free" Products.   -Exercise: 30 minutes a day 3-4 days a week, or 150 minutes a week. Combine stretch, strength, and aerobic activities. You may seek evaluation by your heart doctor prior to initiating exercise if you have high risk for heart disease.  -If you are interested, we can schedule a visit with [name], RDN, CDE for individualized nutrition education.  
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
## 2016-06-16 NOTE — Telephone Encounter (Signed)
error 

## 2016-07-23 ENCOUNTER — Other Ambulatory Visit: Payer: Self-pay | Admitting: "Endocrinology

## 2016-07-23 ENCOUNTER — Other Ambulatory Visit: Payer: Self-pay | Admitting: Cardiology

## 2016-08-20 ENCOUNTER — Other Ambulatory Visit: Payer: Self-pay | Admitting: Cardiology

## 2016-08-20 ENCOUNTER — Other Ambulatory Visit: Payer: Self-pay | Admitting: "Endocrinology

## 2016-08-25 ENCOUNTER — Telehealth: Payer: Self-pay | Admitting: Cardiology

## 2016-08-25 DIAGNOSIS — E7800 Pure hypercholesterolemia, unspecified: Secondary | ICD-10-CM

## 2016-08-25 MED ORDER — AMIODARONE HCL 200 MG PO TABS: 200.0000 mg | ORAL_TABLET | Freq: Every day | ORAL | 0 refills | Status: DC

## 2016-08-25 MED ORDER — METOPROLOL SUCCINATE ER 50 MG PO TB24: 50.0000 mg | ORAL_TABLET | Freq: Every day | ORAL | 0 refills | Status: DC

## 2016-08-25 MED ORDER — ATORVASTATIN CALCIUM 40 MG PO TABS: 40.0000 mg | ORAL_TABLET | Freq: Every day | ORAL | 0 refills | Status: DC

## 2016-08-25 NOTE — Telephone Encounter (Signed)
New message  Pt needs refills prior to appt in 1/5  lipitor 40mg  1 tab/daily 60d amiodarone 200mg  1 tab daily 60d toprol xl 50mg  1 tab daily 60d  [facility]

## 2016-08-25 NOTE — Telephone Encounter (Signed)
Rx has been sent to the pharmacy electronically. ° °

## 2016-09-08 ENCOUNTER — Other Ambulatory Visit: Payer: Self-pay | Admitting: "Endocrinology

## 2016-09-10 ENCOUNTER — Other Ambulatory Visit: Payer: Self-pay | Admitting: "Endocrinology

## 2016-09-10 LAB — COMPLETE METABOLIC PANEL WITH GFR
ALBUMIN: 3.7 g/dL (ref 3.6–5.1)
ALK PHOS: 71 U/L (ref 40–115)
ALT: 13 U/L (ref 9–46)
AST: 14 U/L (ref 10–35)
BILIRUBIN TOTAL: 0.4 mg/dL (ref 0.2–1.2)
BUN: 14 mg/dL (ref 7–25)
CALCIUM: 8.9 mg/dL (ref 8.6–10.3)
CO2: 27 mmol/L (ref 20–31)
CREATININE: 1.07 mg/dL (ref 0.70–1.25)
Chloride: 102 mmol/L (ref 98–110)
GFR, Est African American: 84 mL/min (ref >=60)
GFR, Est Non African American: 73 mL/min (ref >=60)
GLUCOSE: 170 mg/dL — AB (ref 65–99)
POTASSIUM: 4.1 mmol/L (ref 3.5–5.3)
SODIUM: 140 mmol/L (ref 135–146)
TOTAL PROTEIN: 6.8 g/dL (ref 6.1–8.1)

## 2016-09-11 LAB — HEMOGLOBIN A1C
HEMOGLOBIN A1C: 8.5 % — AB (ref <5.7)
Mean Plasma Glucose: 197 mg/dL

## 2016-09-17 ENCOUNTER — Encounter: Payer: Self-pay | Admitting: "Endocrinology

## 2016-09-17 ENCOUNTER — Ambulatory Visit (INDEPENDENT_AMBULATORY_CARE_PROVIDER_SITE_OTHER): Payer: Medicaid Other | Admitting: "Endocrinology

## 2016-09-17 VITALS — BP 104/69 | HR 63 | Ht 67.0 in

## 2016-09-17 DIAGNOSIS — E1159 Type 2 diabetes mellitus with other circulatory complications: Secondary | ICD-10-CM | POA: Diagnosis not present

## 2016-09-17 DIAGNOSIS — E782 Mixed hyperlipidemia: Secondary | ICD-10-CM | POA: Diagnosis not present

## 2016-09-17 DIAGNOSIS — I10 Essential (primary) hypertension: Secondary | ICD-10-CM | POA: Diagnosis not present

## 2016-09-17 DIAGNOSIS — F17200 Nicotine dependence, unspecified, uncomplicated: Secondary | ICD-10-CM
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
## 2016-09-17 NOTE — Patient Instructions (Signed)

## 2016-09-24 ENCOUNTER — Other Ambulatory Visit: Payer: Self-pay | Admitting: *Deleted

## 2016-09-24 DIAGNOSIS — Z95828 Presence of other vascular implants and grafts: Secondary | ICD-10-CM

## 2016-09-24 MED ORDER — CLOPIDOGREL BISULFATE 75 MG PO TABS: 75.0000 mg | ORAL_TABLET | Freq: Every day | ORAL | 5 refills | Status: DC

## 2016-09-29 ENCOUNTER — Encounter (HOSPITAL_COMMUNITY): Payer: Self-pay

## 2016-09-29 ENCOUNTER — Emergency Department (HOSPITAL_COMMUNITY): Payer: Medicaid Other

## 2016-09-29 ENCOUNTER — Emergency Department (HOSPITAL_COMMUNITY)
Admission: EM | Admit: 2016-09-29 | Discharge: 2016-09-29 | Disposition: A | Discharge status: Home / Self Care | Payer: Medicaid Other | Attending: Emergency Medicine | Admitting: Emergency Medicine

## 2016-09-29 DIAGNOSIS — R51 Headache: Secondary | ICD-10-CM | POA: Insufficient documentation

## 2016-09-29 DIAGNOSIS — Y939 Activity, unspecified: Secondary | ICD-10-CM | POA: Insufficient documentation

## 2016-09-29 DIAGNOSIS — Z79899 Other long term (current) drug therapy: Secondary | ICD-10-CM | POA: Insufficient documentation

## 2016-09-29 DIAGNOSIS — Y92410 Unspecified street and highway as the place of occurrence of the external cause: Secondary | ICD-10-CM | POA: Insufficient documentation

## 2016-09-29 DIAGNOSIS — I25810 Atherosclerosis of coronary artery bypass graft(s) without angina pectoris: Secondary | ICD-10-CM | POA: Insufficient documentation

## 2016-09-29 DIAGNOSIS — M25512 Pain in left shoulder: Secondary | ICD-10-CM | POA: Insufficient documentation

## 2016-09-29 DIAGNOSIS — E119 Type 2 diabetes mellitus without complications: Secondary | ICD-10-CM | POA: Insufficient documentation

## 2016-09-29 DIAGNOSIS — I10 Essential (primary) hypertension: Secondary | ICD-10-CM | POA: Diagnosis not present

## 2016-09-29 DIAGNOSIS — Y999 Unspecified external cause status: Secondary | ICD-10-CM | POA: Insufficient documentation

## 2016-09-29 DIAGNOSIS — Z7984 Long term (current) use of oral hypoglycemic drugs: Secondary | ICD-10-CM | POA: Insufficient documentation

## 2016-09-29 DIAGNOSIS — Z87891 Personal history of nicotine dependence: Secondary | ICD-10-CM | POA: Insufficient documentation

## 2016-09-29 DIAGNOSIS — M542 Cervicalgia: Secondary | ICD-10-CM | POA: Diagnosis not present

## 2016-09-29 DIAGNOSIS — Z951 Presence of aortocoronary bypass graft: Secondary | ICD-10-CM | POA: Diagnosis not present

## 2016-09-29 DIAGNOSIS — Z791 Long term (current) use of non-steroidal anti-inflammatories (NSAID): Secondary | ICD-10-CM | POA: Diagnosis not present

## 2016-09-29 DIAGNOSIS — S0990XA Unspecified injury of head, initial encounter: Secondary | ICD-10-CM | POA: Diagnosis present

## 2016-09-29 MED ORDER — HYDROCODONE-ACETAMINOPHEN 5-325 MG PO TABS: 1.0000 | ORAL_TABLET | Freq: Four times a day (QID) | ORAL | 0 refills | Status: DC | PRN

## 2016-09-29 NOTE — ED Triage Notes (Signed)
c-colar applied in triage.
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
## 2016-09-29 NOTE — Discharge Instructions (Signed)
Follow-up with her family doctor next week if any problems
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
## 2016-09-30 ENCOUNTER — Ambulatory Visit: Payer: Medicaid Other | Admitting: Vascular Surgery

## 2016-09-30 ENCOUNTER — Encounter (HOSPITAL_COMMUNITY): Payer: Medicaid Other

## 2016-10-01 ENCOUNTER — Encounter: Payer: Self-pay | Admitting: Vascular Surgery

## 2016-10-07 ENCOUNTER — Encounter: Payer: Self-pay | Admitting: Vascular Surgery

## 2016-10-07 ENCOUNTER — Ambulatory Visit (INDEPENDENT_AMBULATORY_CARE_PROVIDER_SITE_OTHER): Payer: Medicaid Other | Admitting: Vascular Surgery

## 2016-10-07 VITALS — BP 110/63 | HR 69 | Temp 98.2°F | Resp 20 | Ht 67.0 in | Wt 286.0 lb

## 2016-10-07 DIAGNOSIS — I739 Peripheral vascular disease, unspecified: Secondary | ICD-10-CM | POA: Diagnosis not present
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
## 2016-10-11 ENCOUNTER — Emergency Department (HOSPITAL_COMMUNITY): Payer: Medicaid Other

## 2016-10-11 ENCOUNTER — Encounter (HOSPITAL_COMMUNITY): Payer: Self-pay

## 2016-10-11 ENCOUNTER — Emergency Department (HOSPITAL_COMMUNITY)
Admission: EM | Admit: 2016-10-11 | Discharge: 2016-10-11 | Disposition: A | Discharge status: Home / Self Care | Payer: Medicaid Other | Attending: Emergency Medicine | Admitting: Emergency Medicine

## 2016-10-11 DIAGNOSIS — Z794 Long term (current) use of insulin: Secondary | ICD-10-CM | POA: Diagnosis not present

## 2016-10-11 DIAGNOSIS — K859 Acute pancreatitis without necrosis or infection, unspecified: Secondary | ICD-10-CM | POA: Diagnosis not present

## 2016-10-11 DIAGNOSIS — R1084 Generalized abdominal pain: Secondary | ICD-10-CM

## 2016-10-11 DIAGNOSIS — I252 Old myocardial infarction: Secondary | ICD-10-CM | POA: Insufficient documentation

## 2016-10-11 DIAGNOSIS — Z87891 Personal history of nicotine dependence: Secondary | ICD-10-CM | POA: Insufficient documentation

## 2016-10-11 DIAGNOSIS — E119 Type 2 diabetes mellitus without complications: Secondary | ICD-10-CM | POA: Diagnosis not present

## 2016-10-11 DIAGNOSIS — Z79899 Other long term (current) drug therapy: Secondary | ICD-10-CM | POA: Diagnosis not present

## 2016-10-11 DIAGNOSIS — Z951 Presence of aortocoronary bypass graft: Secondary | ICD-10-CM | POA: Insufficient documentation

## 2016-10-11 DIAGNOSIS — Y999 Unspecified external cause status: Secondary | ICD-10-CM | POA: Diagnosis not present

## 2016-10-11 DIAGNOSIS — I10 Essential (primary) hypertension: Secondary | ICD-10-CM | POA: Insufficient documentation

## 2016-10-11 DIAGNOSIS — Y939 Activity, unspecified: Secondary | ICD-10-CM | POA: Diagnosis not present

## 2016-10-11 DIAGNOSIS — Y92410 Unspecified street and highway as the place of occurrence of the external cause: Secondary | ICD-10-CM | POA: Diagnosis not present

## 2016-10-11 DIAGNOSIS — I2510 Atherosclerotic heart disease of native coronary artery without angina pectoris: Secondary | ICD-10-CM | POA: Insufficient documentation

## 2016-10-11 DIAGNOSIS — S8992XA Unspecified injury of left lower leg, initial encounter: Secondary | ICD-10-CM | POA: Diagnosis present

## 2016-10-11 DIAGNOSIS — S80212A Abrasion, left knee, initial encounter: Secondary | ICD-10-CM | POA: Diagnosis not present

## 2016-10-11 LAB — CBC
HEMATOCRIT: 47 % (ref 39.0–52.0)
Hemoglobin: 15.4 g/dL (ref 13.0–17.0)
MCH: 31.8 pg (ref 26.0–34.0)
MCHC: 32.8 g/dL (ref 30.0–36.0)
MCV: 97.1 fL (ref 78.0–100.0)
PLATELETS: 196 10*3/uL (ref 150–400)
RBC: 4.84 MIL/uL (ref 4.22–5.81)
RDW: 14.8 % (ref 11.5–15.5)
WBC: 15.5 10*3/uL — AB (ref 4.0–10.5)

## 2016-10-11 LAB — COMPREHENSIVE METABOLIC PANEL
ALBUMIN: 3.6 g/dL (ref 3.5–5.0)
ALT: 15 U/L — AB (ref 17–63)
AST: 16 U/L (ref 15–41)
Alkaline Phosphatase: 68 U/L (ref 38–126)
Anion gap: 9 (ref 5–15)
BUN: 16 mg/dL (ref 6–20)
CHLORIDE: 100 mmol/L — AB (ref 101–111)
CO2: 24 mmol/L (ref 22–32)
CREATININE: 1.04 mg/dL (ref 0.61–1.24)
Calcium: 9.3 mg/dL (ref 8.9–10.3)
GFR calc Af Amer: >60 mL/min (ref >60)
GFR calc non Af Amer: >60 mL/min (ref >60)
Glucose, Bld: 267 mg/dL — ABNORMAL HIGH (ref 65–99)
POTASSIUM: 4.3 mmol/L (ref 3.5–5.1)
SODIUM: 133 mmol/L — AB (ref 135–145)
Total Bilirubin: 0.6 mg/dL (ref 0.3–1.2)
Total Protein: 7.7 g/dL (ref 6.5–8.1)

## 2016-10-11 LAB — LIPASE, BLOOD: LIPASE: 66 U/L — AB (ref 11–51)

## 2016-10-11 MED ORDER — HYDROCODONE-ACETAMINOPHEN 5-325 MG PO TABS: 1.0000 | ORAL_TABLET | Freq: Four times a day (QID) | ORAL | 0 refills | Status: DC | PRN

## 2016-10-11 MED ORDER — SODIUM CHLORIDE 0.9 % IV SOLN
INTRAVENOUS | Status: DC
Start: 2016-10-11 — End: 2016-10-11
  Administered 2016-10-11: 75 mL/h via INTRAVENOUS

## 2016-10-11 MED ORDER — ONDANSETRON HCL 4 MG/2ML IJ SOLN
4.0000 mg | Freq: Once | INTRAMUSCULAR | Status: AC
  Administered 2016-10-11: 4 mg via INTRAVENOUS
  Filled 2016-10-11: qty 2

## 2016-10-11 MED ORDER — ONDANSETRON 4 MG PO TBDP: 4.0000 mg | ORAL_TABLET | Freq: Three times a day (TID) | ORAL | 1 refills | Status: DC | PRN

## 2016-10-11 MED ORDER — SODIUM CHLORIDE 0.9 % IV BOLUS (SEPSIS)
250.0000 mL | Freq: Once | INTRAVENOUS | Status: AC
  Administered 2016-10-11: 250 mL via INTRAVENOUS

## 2016-10-11 MED ORDER — IOPAMIDOL (ISOVUE-300) INJECTION 61%
100.0000 mL | Freq: Once | INTRAVENOUS | Status: AC | PRN
  Administered 2016-10-11: 100 mL via INTRAVENOUS
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
## 2016-10-11 NOTE — ED Notes (Signed)
Pt has abraised area to left knee. Wife concerned and feels it is swollen. Dr Wallie Char notified
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
## 2016-10-11 NOTE — ED Notes (Signed)
Patient transported to CT 

## 2016-10-14 ENCOUNTER — Encounter (HOSPITAL_COMMUNITY): Payer: Self-pay | Admitting: Emergency Medicine

## 2016-10-14 ENCOUNTER — Emergency Department (HOSPITAL_COMMUNITY)
Admission: EM | Admit: 2016-10-14 | Discharge: 2016-10-14 | Disposition: A | Discharge status: Home / Self Care | Payer: Medicaid Other | Attending: Emergency Medicine | Admitting: Emergency Medicine

## 2016-10-14 DIAGNOSIS — Z7984 Long term (current) use of oral hypoglycemic drugs: Secondary | ICD-10-CM | POA: Insufficient documentation

## 2016-10-14 DIAGNOSIS — G8929 Other chronic pain: Secondary | ICD-10-CM | POA: Diagnosis not present

## 2016-10-14 DIAGNOSIS — Z79899 Other long term (current) drug therapy: Secondary | ICD-10-CM | POA: Insufficient documentation

## 2016-10-14 DIAGNOSIS — Z87891 Personal history of nicotine dependence: Secondary | ICD-10-CM | POA: Insufficient documentation

## 2016-10-14 DIAGNOSIS — I10 Essential (primary) hypertension: Secondary | ICD-10-CM | POA: Insufficient documentation

## 2016-10-14 DIAGNOSIS — R109 Unspecified abdominal pain: Secondary | ICD-10-CM

## 2016-10-14 DIAGNOSIS — E119 Type 2 diabetes mellitus without complications: Secondary | ICD-10-CM | POA: Insufficient documentation

## 2016-10-14 DIAGNOSIS — I2510 Atherosclerotic heart disease of native coronary artery without angina pectoris: Secondary | ICD-10-CM | POA: Diagnosis not present

## 2016-10-14 DIAGNOSIS — R10812 Left upper quadrant abdominal tenderness: Secondary | ICD-10-CM | POA: Diagnosis not present
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
## 2016-10-14 NOTE — Discharge Instructions (Signed)
Follow-up with your primary physician for refills of your chronic pain medication.

## 2016-10-14 NOTE — ED Notes (Signed)
Pt wheeled to car. Pt verbalized understanding of discharge instructions.  

## 2016-10-14 NOTE — ED Triage Notes (Signed)
Pt c/o generalized abd pain after he eats. Pt denies any n/v/d.

## 2016-10-16 ENCOUNTER — Ambulatory Visit: Payer: Medicaid Other | Admitting: Cardiology

## 2016-10-23 ENCOUNTER — Other Ambulatory Visit: Payer: Self-pay | Admitting: "Endocrinology
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
## 2016-11-06 ENCOUNTER — Ambulatory Visit (HOSPITAL_COMMUNITY)
Admission: RE | Admit: 2016-11-06 | Discharge: 2016-11-06 | Disposition: A | Discharge status: Home / Self Care | Payer: Medicaid Other | Source: Ambulatory Visit | Attending: Cardiology | Admitting: Cardiology

## 2016-11-06 ENCOUNTER — Encounter: Payer: Self-pay | Admitting: Cardiology

## 2016-11-06 ENCOUNTER — Ambulatory Visit (INDEPENDENT_AMBULATORY_CARE_PROVIDER_SITE_OTHER): Payer: Medicaid Other | Admitting: Cardiology

## 2016-11-06 VITALS — BP 94/58 | HR 74 | Ht 67.0 in | Wt 267.0 lb

## 2016-11-06 DIAGNOSIS — I10 Essential (primary) hypertension: Secondary | ICD-10-CM | POA: Diagnosis not present

## 2016-11-06 DIAGNOSIS — I2510 Atherosclerotic heart disease of native coronary artery without angina pectoris: Secondary | ICD-10-CM

## 2016-11-06 DIAGNOSIS — I4892 Unspecified atrial flutter: Secondary | ICD-10-CM | POA: Insufficient documentation

## 2016-11-06 DIAGNOSIS — E7800 Pure hypercholesterolemia, unspecified: Secondary | ICD-10-CM | POA: Diagnosis not present

## 2016-11-06 LAB — TSH: TSH: 0.92 mIU/L (ref 0.40–4.50)

## 2016-11-06 MED ORDER — METOPROLOL SUCCINATE ER 25 MG PO TB24: 25.0000 mg | ORAL_TABLET | Freq: Every day | ORAL | 3 refills | Status: DC

## 2016-11-06 NOTE — Patient Instructions (Addendum)
Medication Instructions:   DECREASE METOPROLOL TO 25 MG ONCE DAILY= 1/2 OF THE 50 MG TABLET ONCE DAILY  Labwork:  Your physician recommends that you HAVE LAB WORK TODAY  TESTING:  A chest x-ray takes a picture of the organs and structures inside the chest, including the heart, lungs, and blood vessels. This test can show several things, including, whether the heart is enlarges; whether fluid is building up in the lungs; and whether pacemaker / defibrillator leads are still in place. [place]  Follow-Up:  Your physician wants you to follow-up in: [place] will receive a reminder letter in the mail two months in advance. If you don't receive a letter, please call our office to schedule the follow-up appointment.   If you need a refill on your cardiac medications before your next appointment, please call your pharmacy.

## 2016-11-16 ENCOUNTER — Encounter (HOSPITAL_COMMUNITY): Payer: Medicaid Other

## 2016-11-24 ENCOUNTER — Telehealth: Payer: Self-pay | Admitting: Cardiology

## 2016-11-24 ENCOUNTER — Other Ambulatory Visit: Payer: Self-pay | Admitting: Cardiology

## 2016-11-24 ENCOUNTER — Encounter: Payer: Self-pay | Admitting: Vascular Surgery

## 2016-11-24 NOTE — Telephone Encounter (Signed)
REFILL 

## 2016-11-24 NOTE — Telephone Encounter (Signed)
Per last OV AVS:  DECREASE METOPROLOL TO 25 MG ONCE DAILY= 1/2 OF THE 50 MG TABLET ONCE DAILY Molly at [facility] notified 25mg  daily

## 2016-11-24 NOTE — Telephone Encounter (Signed)
She need the correct strength of the pt;s Metoprolol.

## 2016-12-02 ENCOUNTER — Encounter (HOSPITAL_COMMUNITY): Payer: Self-pay

## 2016-12-02 ENCOUNTER — Ambulatory Visit (HOSPITAL_COMMUNITY)
Admission: RE | Admit: 2016-12-02 | Discharge: 2016-12-02 | Disposition: A | Discharge status: Home / Self Care | Payer: Medicaid Other | Source: Ambulatory Visit | Attending: Vascular Surgery | Admitting: Vascular Surgery

## 2016-12-02 ENCOUNTER — Encounter (HOSPITAL_COMMUNITY): Payer: Medicaid Other

## 2016-12-02 ENCOUNTER — Ambulatory Visit (INDEPENDENT_AMBULATORY_CARE_PROVIDER_SITE_OTHER): Payer: Medicaid Other | Admitting: Vascular Surgery

## 2016-12-02 ENCOUNTER — Encounter: Payer: Self-pay | Admitting: Vascular Surgery

## 2016-12-02 VITALS — BP 108/66 | HR 79 | Temp 98.4°F | Resp 20 | Ht 67.0 in | Wt 287.0 lb

## 2016-12-02 DIAGNOSIS — I6529 Occlusion and stenosis of unspecified carotid artery: Secondary | ICD-10-CM | POA: Diagnosis not present

## 2016-12-02 DIAGNOSIS — I739 Peripheral vascular disease, unspecified: Secondary | ICD-10-CM | POA: Diagnosis not present
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
## 2016-12-07 ENCOUNTER — Other Ambulatory Visit: Payer: Self-pay | Admitting: "Endocrinology

## 2016-12-07 NOTE — Addendum Note (Signed)
Addended by: Lianne Cure A on: 12/07/2016 10:35 AM   Modules accepted: Orders

## 2016-12-14 ENCOUNTER — Other Ambulatory Visit: Payer: Self-pay | Admitting: "Endocrinology

## 2016-12-14 LAB — COMPREHENSIVE METABOLIC PANEL
ALK PHOS: 70 U/L (ref 40–115)
ALT: 11 U/L (ref 9–46)
AST: 12 U/L (ref 10–35)
Albumin: 3.9 g/dL (ref 3.6–5.1)
BILIRUBIN TOTAL: 0.4 mg/dL (ref 0.2–1.2)
BUN: 14 mg/dL (ref 7–25)
CO2: 26 mmol/L (ref 20–31)
CREATININE: 1.16 mg/dL (ref 0.70–1.25)
Calcium: 9 mg/dL (ref 8.6–10.3)
Chloride: 103 mmol/L (ref 98–110)
GLUCOSE: 252 mg/dL — AB (ref 65–99)
Potassium: 4.2 mmol/L (ref 3.5–5.3)
SODIUM: 140 mmol/L (ref 135–146)
Total Protein: 7.1 g/dL (ref 6.1–8.1)

## 2016-12-14 LAB — LIPID PANEL
Cholesterol: 156 mg/dL (ref <200)
HDL: 34 mg/dL — AB (ref >40)
LDL CALC: 78 mg/dL (ref <100)
Total CHOL/HDL Ratio: 4.6 Ratio (ref <5.0)
Triglycerides: 221 mg/dL — ABNORMAL HIGH (ref <150)
VLDL: 44 mg/dL — ABNORMAL HIGH (ref <30)

## 2016-12-14 LAB — TSH: TSH: 1.44 m[IU]/L (ref 0.40–4.50)

## 2016-12-14 LAB — HEMOGLOBIN A1C
Hgb A1c MFr Bld: 8.2 % — ABNORMAL HIGH (ref <5.7)
MEAN PLASMA GLUCOSE: 189 mg/dL

## 2016-12-14 LAB — T4, FREE: FREE T4: 1.4 ng/dL (ref 0.8–1.8)

## 2016-12-15 LAB — MICROALBUMIN / CREATININE URINE RATIO

## 2016-12-16 ENCOUNTER — Encounter (INDEPENDENT_AMBULATORY_CARE_PROVIDER_SITE_OTHER): Payer: Self-pay | Admitting: Internal Medicine

## 2016-12-16 ENCOUNTER — Encounter (INDEPENDENT_AMBULATORY_CARE_PROVIDER_SITE_OTHER): Payer: Self-pay

## 2016-12-21 ENCOUNTER — Encounter: Payer: Self-pay | Admitting: "Endocrinology

## 2016-12-21 ENCOUNTER — Ambulatory Visit (INDEPENDENT_AMBULATORY_CARE_PROVIDER_SITE_OTHER): Payer: Medicaid Other | Admitting: "Endocrinology

## 2016-12-21 VITALS — BP 100/66 | HR 78 | Ht 67.0 in

## 2016-12-21 DIAGNOSIS — E1159 Type 2 diabetes mellitus with other circulatory complications: Secondary | ICD-10-CM | POA: Diagnosis not present

## 2016-12-21 DIAGNOSIS — I10 Essential (primary) hypertension: Secondary | ICD-10-CM | POA: Diagnosis not present

## 2016-12-21 DIAGNOSIS — Z9189 Other specified personal risk factors, not elsewhere classified: Secondary | ICD-10-CM | POA: Diagnosis not present

## 2016-12-21 DIAGNOSIS — F17200 Nicotine dependence, unspecified, uncomplicated: Secondary | ICD-10-CM

## 2016-12-21 DIAGNOSIS — E782 Mixed hyperlipidemia: Secondary | ICD-10-CM

## 2016-12-21 MED ORDER — DAPAGLIFLOZIN PROPANEDIOL 5 MG PO TABS: 5.0000 mg | ORAL_TABLET | Freq: Every day | ORAL | 2 refills | Status: DC
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
## 2016-12-21 NOTE — Patient Instructions (Signed)

## 2016-12-24 ENCOUNTER — Other Ambulatory Visit: Payer: Self-pay | Admitting: "Endocrinology

## 2016-12-31 ENCOUNTER — Encounter (INDEPENDENT_AMBULATORY_CARE_PROVIDER_SITE_OTHER): Payer: Self-pay | Admitting: Internal Medicine

## 2016-12-31 ENCOUNTER — Ambulatory Visit (INDEPENDENT_AMBULATORY_CARE_PROVIDER_SITE_OTHER): Payer: Medicaid Other | Admitting: Internal Medicine

## 2016-12-31 VITALS — BP 114/72 | HR 76 | Temp 98.1°F | Resp 18 | Ht 67.0 in | Wt 287.0 lb

## 2016-12-31 DIAGNOSIS — R1010 Upper abdominal pain, unspecified: Secondary | ICD-10-CM

## 2016-12-31 MED ORDER — PANTOPRAZOLE SODIUM 40 MG PO TBEC
40.0000 mg | DELAYED_RELEASE_TABLET | Freq: Every day | ORAL | 5 refills | Status: DC
Start: 2016-12-31 — End: 2017-09-01

## 2016-12-31 MED ORDER — PANTOPRAZOLE SODIUM 40 MG PO TBEC: 40.0000 mg | DELAYED_RELEASE_TABLET | Freq: Every day | ORAL | 5 refills | Status: DC
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
## 2016-12-31 NOTE — Patient Instructions (Signed)
Protonix 40mg  daily. CT abdomen pelvis with CM.

## 2017-01-13 ENCOUNTER — Ambulatory Visit (HOSPITAL_COMMUNITY): Payer: Medicaid Other

## 2017-01-22 ENCOUNTER — Ambulatory Visit (HOSPITAL_COMMUNITY)
Admission: RE | Admit: 2017-01-22 | Discharge: 2017-01-22 | Disposition: A | Discharge status: Home / Self Care | Payer: Medicaid Other | Source: Ambulatory Visit | Attending: Internal Medicine | Admitting: Internal Medicine

## 2017-01-22 DIAGNOSIS — R1010 Upper abdominal pain, unspecified: Secondary | ICD-10-CM | POA: Diagnosis present

## 2017-01-22 DIAGNOSIS — N281 Cyst of kidney, acquired: Secondary | ICD-10-CM | POA: Diagnosis not present

## 2017-01-22 DIAGNOSIS — I700 Atherosclerosis of aorta: Secondary | ICD-10-CM | POA: Insufficient documentation

## 2017-01-22 MED ORDER — IOPAMIDOL (ISOVUE-300) INJECTION 61%: 100.0000 mL | Freq: Once | INTRAVENOUS | Status: DC | PRN

## 2017-01-22 MED ORDER — IOPAMIDOL (ISOVUE-370) INJECTION 76%
100.0000 mL | Freq: Once | INTRAVENOUS | Status: AC | PRN
  Administered 2017-01-22: 100 mL via INTRAVENOUS

## 2017-01-29 ENCOUNTER — Telehealth (INDEPENDENT_AMBULATORY_CARE_PROVIDER_SITE_OTHER): Payer: Self-pay | Admitting: Internal Medicine

## 2017-01-29 DIAGNOSIS — R932 Abnormal findings on diagnostic imaging of liver and biliary tract: Secondary | ICD-10-CM

## 2017-01-29 DIAGNOSIS — T189XXD Foreign body of alimentary tract, part unspecified, subsequent encounter: Secondary | ICD-10-CM

## 2017-01-29 NOTE — Telephone Encounter (Signed)
[place] sch'd 02/03/17 at [phone] npo after midnight, patient aware

## 2017-01-29 NOTE — Telephone Encounter (Signed)
ANN, US abdomen. I have spoke with patient.

## 2017-02-03 ENCOUNTER — Ambulatory Visit (HOSPITAL_COMMUNITY)
Admission: RE | Admit: 2017-02-03 | Discharge: 2017-02-03 | Disposition: A | Discharge status: Home / Self Care | Payer: Medicaid Other | Source: Ambulatory Visit | Attending: Internal Medicine | Admitting: Internal Medicine

## 2017-02-03 DIAGNOSIS — T189XXD Foreign body of alimentary tract, part unspecified, subsequent encounter: Secondary | ICD-10-CM | POA: Diagnosis present

## 2017-02-03 DIAGNOSIS — N281 Cyst of kidney, acquired: Secondary | ICD-10-CM | POA: Insufficient documentation

## 2017-02-03 DIAGNOSIS — R932 Abnormal findings on diagnostic imaging of liver and biliary tract: Secondary | ICD-10-CM | POA: Insufficient documentation

## 2017-02-08 ENCOUNTER — Other Ambulatory Visit (INDEPENDENT_AMBULATORY_CARE_PROVIDER_SITE_OTHER): Payer: Self-pay | Admitting: Internal Medicine

## 2017-02-08 ENCOUNTER — Telehealth (INDEPENDENT_AMBULATORY_CARE_PROVIDER_SITE_OTHER): Payer: Self-pay | Admitting: Internal Medicine

## 2017-02-08 DIAGNOSIS — R1013 Epigastric pain: Secondary | ICD-10-CM

## 2017-02-08 NOTE — Telephone Encounter (Signed)
[name] - They want the results of the diagnostic you ordered.

## 2017-02-08 NOTE — Telephone Encounter (Signed)
Results have been given to patient. Will be scheduled for an EGd

## 2017-02-08 NOTE — Telephone Encounter (Signed)
[name], patient's spouse returned the call for results from a test he recently had done.  [phone]

## 2017-02-09 ENCOUNTER — Encounter (INDEPENDENT_AMBULATORY_CARE_PROVIDER_SITE_OTHER): Payer: Self-pay | Admitting: *Deleted

## 2017-02-09 DIAGNOSIS — R1013 Epigastric pain: Secondary | ICD-10-CM | POA: Insufficient documentation

## 2017-02-22 ENCOUNTER — Other Ambulatory Visit: Payer: Self-pay | Admitting: "Endocrinology

## 2017-02-23 ENCOUNTER — Other Ambulatory Visit: Payer: Self-pay

## 2017-02-23 MED ORDER — METFORMIN HCL 1000 MG PO TABS: 1000.0000 mg | ORAL_TABLET | Freq: Two times a day (BID) | ORAL | 2 refills | Status: DC

## 2017-03-15 ENCOUNTER — Other Ambulatory Visit: Payer: Self-pay | Admitting: "Endocrinology

## 2017-03-20 ENCOUNTER — Other Ambulatory Visit: Payer: Self-pay | Admitting: "Endocrinology

## 2017-03-22 ENCOUNTER — Other Ambulatory Visit: Payer: Self-pay | Admitting: Vascular Surgery

## 2017-03-22 DIAGNOSIS — Z95828 Presence of other vascular implants and grafts: Secondary | ICD-10-CM

## 2017-03-22 MED ORDER — CLOPIDOGREL BISULFATE 75 MG PO TABS: 75.0000 mg | ORAL_TABLET | Freq: Every day | ORAL | 5 refills | Status: DC

## 2017-04-15 ENCOUNTER — Other Ambulatory Visit: Payer: Self-pay | Admitting: "Endocrinology

## 2017-04-15 LAB — COMPREHENSIVE METABOLIC PANEL
ALK PHOS: 87 U/L (ref 40–115)
ALT: 11 U/L (ref 9–46)
AST: 12 U/L (ref 10–35)
Albumin: 3.8 g/dL (ref 3.6–5.1)
BUN: 12 mg/dL (ref 7–25)
CALCIUM: 8.9 mg/dL (ref 8.6–10.3)
CO2: 29 mmol/L (ref 20–31)
Chloride: 100 mmol/L (ref 98–110)
Creat: 1 mg/dL (ref 0.70–1.25)
GLUCOSE: 83 mg/dL (ref 65–99)
Potassium: 4.4 mmol/L (ref 3.5–5.3)
Sodium: 137 mmol/L (ref 135–146)
Total Bilirubin: 0.4 mg/dL (ref 0.2–1.2)
Total Protein: 6.9 g/dL (ref 6.1–8.1)

## 2017-04-16 LAB — HEMOGLOBIN A1C
Hgb A1c MFr Bld: 7.8 % — ABNORMAL HIGH (ref <5.7)
Mean Plasma Glucose: 177 mg/dL

## 2017-04-21 ENCOUNTER — Other Ambulatory Visit: Payer: Self-pay | Admitting: *Deleted

## 2017-04-21 DIAGNOSIS — E7800 Pure hypercholesterolemia, unspecified: Secondary | ICD-10-CM

## 2017-04-21 MED ORDER — ATORVASTATIN CALCIUM 40 MG PO TABS: 40.0000 mg | ORAL_TABLET | Freq: Every day | ORAL | 5 refills | Status: DC

## 2017-04-21 MED ORDER — AMIODARONE HCL 200 MG PO TABS: 200.0000 mg | ORAL_TABLET | Freq: Every day | ORAL | 5 refills | Status: DC

## 2017-04-21 NOTE — Telephone Encounter (Signed)
REFILL 

## 2017-04-22 ENCOUNTER — Encounter: Payer: Self-pay | Admitting: "Endocrinology

## 2017-04-22 ENCOUNTER — Ambulatory Visit (INDEPENDENT_AMBULATORY_CARE_PROVIDER_SITE_OTHER): Payer: Medicare Other | Admitting: "Endocrinology

## 2017-04-22 VITALS — BP 93/59 | HR 64 | Ht 67.0 in

## 2017-04-22 DIAGNOSIS — F17200 Nicotine dependence, unspecified, uncomplicated: Secondary | ICD-10-CM

## 2017-04-22 DIAGNOSIS — E1159 Type 2 diabetes mellitus with other circulatory complications: Secondary | ICD-10-CM

## 2017-04-22 DIAGNOSIS — E782 Mixed hyperlipidemia: Secondary | ICD-10-CM

## 2017-04-22 DIAGNOSIS — I10 Essential (primary) hypertension: Secondary | ICD-10-CM

## 2017-04-22 MED ORDER — INSULIN DETEMIR 100 UNIT/ML FLEXPEN: 40.0000 [IU] | PEN_INJECTOR | Freq: Every day | SUBCUTANEOUS | 2 refills | Status: DC
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
## 2017-04-22 NOTE — Patient Instructions (Signed)

## 2017-05-04 ENCOUNTER — Encounter (INDEPENDENT_AMBULATORY_CARE_PROVIDER_SITE_OTHER): Payer: Self-pay | Admitting: *Deleted

## 2017-05-12 ENCOUNTER — Ambulatory Visit (INDEPENDENT_AMBULATORY_CARE_PROVIDER_SITE_OTHER): Payer: Medicare Other | Admitting: Urology

## 2017-05-12 DIAGNOSIS — N401 Enlarged prostate with lower urinary tract symptoms: Secondary | ICD-10-CM

## 2017-05-20 ENCOUNTER — Encounter (HOSPITAL_COMMUNITY)
Admission: RE | Disposition: A | Discharge status: Home / Self Care | Payer: Self-pay | Source: Ambulatory Visit | Attending: Internal Medicine

## 2017-05-20 ENCOUNTER — Ambulatory Visit (HOSPITAL_COMMUNITY)
Admission: RE | Admit: 2017-05-20 | Discharge: 2017-05-20 | Disposition: A | Discharge status: Home / Self Care | Payer: Medicare Other | Source: Ambulatory Visit | Attending: Internal Medicine | Admitting: Internal Medicine

## 2017-05-20 ENCOUNTER — Encounter (HOSPITAL_COMMUNITY): Payer: Self-pay | Admitting: *Deleted

## 2017-05-20 DIAGNOSIS — E785 Hyperlipidemia, unspecified: Secondary | ICD-10-CM | POA: Insufficient documentation

## 2017-05-20 DIAGNOSIS — K766 Portal hypertension: Secondary | ICD-10-CM | POA: Diagnosis not present

## 2017-05-20 DIAGNOSIS — Z87442 Personal history of urinary calculi: Secondary | ICD-10-CM | POA: Diagnosis not present

## 2017-05-20 DIAGNOSIS — Z8249 Family history of ischemic heart disease and other diseases of the circulatory system: Secondary | ICD-10-CM | POA: Insufficient documentation

## 2017-05-20 DIAGNOSIS — I2510 Atherosclerotic heart disease of native coronary artery without angina pectoris: Secondary | ICD-10-CM | POA: Insufficient documentation

## 2017-05-20 DIAGNOSIS — Z951 Presence of aortocoronary bypass graft: Secondary | ICD-10-CM | POA: Insufficient documentation

## 2017-05-20 DIAGNOSIS — Z79899 Other long term (current) drug therapy: Secondary | ICD-10-CM | POA: Diagnosis not present

## 2017-05-20 DIAGNOSIS — E1142 Type 2 diabetes mellitus with diabetic polyneuropathy: Secondary | ICD-10-CM | POA: Insufficient documentation

## 2017-05-20 DIAGNOSIS — I252 Old myocardial infarction: Secondary | ICD-10-CM | POA: Diagnosis not present

## 2017-05-20 DIAGNOSIS — K2970 Gastritis, unspecified, without bleeding: Secondary | ICD-10-CM | POA: Diagnosis not present

## 2017-05-20 DIAGNOSIS — I4892 Unspecified atrial flutter: Secondary | ICD-10-CM | POA: Insufficient documentation

## 2017-05-20 DIAGNOSIS — F17210 Nicotine dependence, cigarettes, uncomplicated: Secondary | ICD-10-CM | POA: Insufficient documentation

## 2017-05-20 DIAGNOSIS — G4733 Obstructive sleep apnea (adult) (pediatric): Secondary | ICD-10-CM | POA: Insufficient documentation

## 2017-05-20 DIAGNOSIS — Z7902 Long term (current) use of antithrombotics/antiplatelets: Secondary | ICD-10-CM | POA: Insufficient documentation

## 2017-05-20 DIAGNOSIS — Z8673 Personal history of transient ischemic attack (TIA), and cerebral infarction without residual deficits: Secondary | ICD-10-CM | POA: Diagnosis not present

## 2017-05-20 DIAGNOSIS — K2900 Acute gastritis without bleeding: Secondary | ICD-10-CM

## 2017-05-20 DIAGNOSIS — E119 Type 2 diabetes mellitus without complications: Secondary | ICD-10-CM | POA: Diagnosis not present

## 2017-05-20 DIAGNOSIS — K3189 Other diseases of stomach and duodenum: Secondary | ICD-10-CM | POA: Diagnosis not present

## 2017-05-20 DIAGNOSIS — R1013 Epigastric pain: Secondary | ICD-10-CM | POA: Insufficient documentation

## 2017-05-20 DIAGNOSIS — K228 Other specified diseases of esophagus: Secondary | ICD-10-CM | POA: Insufficient documentation

## 2017-05-20 DIAGNOSIS — Z794 Long term (current) use of insulin: Secondary | ICD-10-CM | POA: Diagnosis not present

## 2017-05-20 DIAGNOSIS — I10 Essential (primary) hypertension: Secondary | ICD-10-CM | POA: Insufficient documentation

## 2017-05-20 HISTORY — PX: ESOPHAGOGASTRODUODENOSCOPY: SHX5428

## 2017-05-20 LAB — GLUCOSE, CAPILLARY: Glucose-Capillary: 127 mg/dL — ABNORMAL HIGH (ref 65–99)

## 2017-05-20 SURGERY — EGD (ESOPHAGOGASTRODUODENOSCOPY)
Anesthesia: Moderate Sedation

## 2017-05-20 MED ORDER — SIMETHICONE 40 MG/0.6ML PO SUSP
ORAL | Status: DC | PRN
  Administered 2017-05-20: 2.5 mL

## 2017-05-20 MED ORDER — LIDOCAINE VISCOUS 2 % MT SOLN
OROMUCOSAL | Status: DC | PRN
  Administered 2017-05-20: 1 via OROMUCOSAL

## 2017-05-20 MED ORDER — MIDAZOLAM HCL 5 MG/5ML IJ SOLN
INTRAMUSCULAR | Status: AC
  Filled 2017-05-20: qty 10

## 2017-05-20 MED ORDER — SODIUM CHLORIDE 0.9 % IV SOLN
INTRAVENOUS | Status: DC
  Administered 2017-05-20: 14:00:00 via INTRAVENOUS

## 2017-05-20 MED ORDER — LIDOCAINE VISCOUS 2 % MT SOLN
OROMUCOSAL | Status: AC
  Filled 2017-05-20: qty 15

## 2017-05-20 MED ORDER — MIDAZOLAM HCL 5 MG/5ML IJ SOLN
INTRAMUSCULAR | Status: DC | PRN
  Administered 2017-05-20 (×2): 1 mg via INTRAVENOUS

## 2017-05-20 MED ORDER — MEPERIDINE HCL 50 MG/ML IJ SOLN
INTRAMUSCULAR | Status: AC
  Filled 2017-05-20: qty 1

## 2017-05-20 NOTE — Discharge Instructions (Signed)
Resume usual medications and diet. Physician will call with results of blood test.   Upper Endoscopy, Care After Refer to this sheet in the next few weeks. These instructions provide you with information about caring for yourself after your procedure. Your health care provider may also give you more specific instructions. Your treatment has been planned according to current medical practices, but problems sometimes occur. Call your health care provider if you have any problems or questions after your procedure. What can I expect after the procedure? After the procedure, it is common to have:  A sore throat.  Bloating.  Nausea.  Follow these instructions at home:  Follow instructions from your health care provider about what to eat or drink after your procedure.  Return to your normal activities as told by your health care provider. Ask your health care provider what activities are safe for you.  Take over-the-counter and prescription medicines only as told by your health care provider.  Do not drive for 24 hours if you received a sedative.  Keep all follow-up visits as told by your health care provider. This is important. Contact a health care provider if:  You have a sore throat that lasts longer than one day.  You have trouble swallowing. Get help right away if:  You have a fever.  You vomit blood or your vomit looks like coffee grounds.  You have bloody, black, or tarry stools.  You have a severe sore throat or you cannot swallow.  You have difficulty breathing.  You have severe pain in your chest or belly. This information is not intended to replace advice given to you by your health care provider. Make sure you discuss any questions you have with your health care provider. Document Released: 03/29/2012 Document Revised: 03/05/2016 Document Reviewed: 07/11/2015 Elsevier Interactive Patient Education  2017 [employer].
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
## 2017-05-20 NOTE — Op Note (Signed)
[facility] Patient Name: [name] Procedure Date: 05/20/2017 1:20 PM MRN: [id number] Date of Birth: 1951-07-20 Attending MD: Hildred Laser , MD CSN: [id number] Age: 66 Admit Type: Outpatient Procedure:                Upper GI endoscopy Indications:              Epigastric abdominal pain Providers:                Hildred Laser, MD, Janeece Riggers, RN, Lurline Del, RN Referring MD:             Kerin Perna, FNP Medicines:                Lidocaine spray, Midazolam 2 mg IV Complications:            No immediate complications. Estimated Blood Loss:     Estimated blood loss: none. Procedure:                Pre-Anesthesia Assessment:                           - Prior to the procedure, a History and Physical                            was performed, and patient medications and                            allergies were reviewed. The patient's tolerance of                            previous anesthesia was also reviewed. The risks                            and benefits of the procedure and the sedation                            options and risks were discussed with the patient.                            All questions were answered, and informed consent                            was obtained. Prior Anticoagulants: The patient                            last took Plavix (clopidogrel) 1 day prior to the                            procedure. ASA Grade Assessment: III - A patient                            with severe systemic disease. After reviewing the                            risks and benefits, the patient was deemed in  satisfactory condition to undergo the procedure.                           After obtaining informed consent, the endoscope was                            passed under direct vision. Throughout the                            procedure, the patient's blood pressure, pulse, and                            oxygen saturations were monitored  continuously. The                            [id number]-[id number] ([id number]) scope was introduced through the                            mouth, and advanced to the second part of duodenum.                            The upper GI endoscopy was accomplished without                            difficulty. The patient tolerated the procedure                            well. Scope In: 2:05:19 PM Scope Out: 2:10:05 PM Total Procedure Duration: 0 hours 4 minutes 46 seconds  Findings:      The examined esophagus was normal.      The Z-line was irregular and was found 41 cm from the incisors.      Mild portal hypertensive gastropathy was found in the gastric fundus and       in the gastric body.      Patchy mild inflammation characterized by congestion (edema), erythema       and granularity was found in the gastric antrum.      The pylorus was normal.      The duodenal bulb and second portion of the duodenum were normal. Impression:               - Normal esophagus.                           - Z-line irregular, 41 cm from the incisors.                           - Portal hypertensive gastropathy.                           - Gastritis.                           - Normal pylorus.                           - Normal duodenal bulb and second  portion of the                            duodenum.                           - No specimens collected. Moderate Sedation:      Moderate (conscious) sedation was administered by the endoscopy nurse       and supervised by the endoscopist. The following parameters were       monitored: oxygen saturation, heart rate, blood pressure, CO2       capnography and response to care. Total physician intraservice time was       9 minutes. Recommendation:           - Patient has a contact number available for                            emergencies. The signs and symptoms of potential                            delayed complications were discussed with the                             patient. Return to normal activities tomorrow.                            Written discharge instructions were provided to the                            patient.                           - Resume previous diet today.                           - Continue present medications.                           - H. pylori serology.                           comment:Suspect we are dealing with chronic                            abdominal pain.                           Will rule out H. pylori gastritis by doing serology. Procedure Code(s):        --- Professional ---                           [phone], Esophagogastroduodenoscopy, flexible,                            transoral; diagnostic, including collection of                            specimen(s)  by brushing or washing, when performed                            (separate procedure) Diagnosis Code(s):        --- Professional ---                           K22.8, Other specified diseases of esophagus                           K76.6, Portal hypertension                           K31.89, Other diseases of stomach and duodenum                           K29.70, Gastritis, unspecified, without bleeding                           R10.13, Epigastric pain CPT copyright 2016 American Medical Association. All rights reserved. The codes documented in this report are preliminary and upon coder review may  be revised to meet current compliance requirements. Hildred Laser, MD Hildred Laser, MD 05/20/2017 2:29:52 PM This report has been signed electronically. Number of Addenda: 0

## 2017-05-21 LAB — H. PYLORI ANTIBODY, IGG: H PYLORI IGG: 1.13 {index_val} — AB (ref 0.00–0.79)

## 2017-05-22 ENCOUNTER — Other Ambulatory Visit (INDEPENDENT_AMBULATORY_CARE_PROVIDER_SITE_OTHER): Payer: Self-pay | Admitting: Internal Medicine

## 2017-05-25 ENCOUNTER — Encounter (HOSPITAL_COMMUNITY): Payer: Self-pay | Admitting: Internal Medicine

## 2017-06-07 ENCOUNTER — Other Ambulatory Visit (INDEPENDENT_AMBULATORY_CARE_PROVIDER_SITE_OTHER): Payer: Self-pay | Admitting: Internal Medicine

## 2017-06-07 MED ORDER — BIS SUBCIT-METRONID-TETRACYC 140-125-125 MG PO CAPS: 3.0000 | ORAL_CAPSULE | Freq: Three times a day (TID) | ORAL | 0 refills | Status: DC

## 2017-06-07 NOTE — Progress Notes (Signed)
Discussed with Scott at pharmacy. 10 days of Pylera should not be of concern.

## 2017-06-09 ENCOUNTER — Encounter: Payer: Self-pay | Admitting: Vascular Surgery

## 2017-06-09 ENCOUNTER — Ambulatory Visit: Payer: Medicaid Other | Admitting: Vascular Surgery

## 2017-06-09 ENCOUNTER — Encounter (HOSPITAL_COMMUNITY): Payer: Medicaid Other

## 2017-06-16 ENCOUNTER — Ambulatory Visit (HOSPITAL_COMMUNITY)
Admission: RE | Admit: 2017-06-16 | Discharge: 2017-06-16 | Disposition: A | Discharge status: Home / Self Care | Payer: Medicare Other | Source: Ambulatory Visit | Attending: Vascular Surgery | Admitting: Vascular Surgery

## 2017-06-16 ENCOUNTER — Encounter: Payer: Self-pay | Admitting: Vascular Surgery

## 2017-06-16 ENCOUNTER — Ambulatory Visit (INDEPENDENT_AMBULATORY_CARE_PROVIDER_SITE_OTHER): Payer: Medicare Other | Admitting: Vascular Surgery

## 2017-06-16 VITALS — BP 99/63 | HR 66 | Temp 97.8°F | Ht 67.0 in | Wt 268.4 lb

## 2017-06-16 DIAGNOSIS — I6523 Occlusion and stenosis of bilateral carotid arteries: Secondary | ICD-10-CM

## 2017-06-16 DIAGNOSIS — I6529 Occlusion and stenosis of unspecified carotid artery: Secondary | ICD-10-CM

## 2017-06-16 DIAGNOSIS — I739 Peripheral vascular disease, unspecified: Secondary | ICD-10-CM

## 2017-06-16 LAB — VAS US CAROTID
LCCADDIAS: -37 cm/s
LCCAPSYS: 77 cm/s
LEFT ECA DIAS: -54 cm/s
LEFT VERTEBRAL DIAS: 14 cm/s
LICAPSYS: 186 cm/s
Left CCA dist sys: -162 cm/s
Left CCA prox dias: 23 cm/s
Left ICA dist dias: -26 cm/s
Left ICA dist sys: -114 cm/s
Left ICA prox dias: 41 cm/s
RCCAPDIAS: 0 cm/s
RCCAPSYS: 85 cm/s
RIGHT CCA MID DIAS: 12 cm/s
RIGHT ECA DIAS: -21 cm/s
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
## 2017-06-17 NOTE — Addendum Note (Signed)
Addended by: Lianne Cure A on: 06/17/2017 10:24 AM   Modules accepted: Orders

## 2017-06-23 ENCOUNTER — Other Ambulatory Visit: Payer: Self-pay | Admitting: "Endocrinology

## 2017-07-13 ENCOUNTER — Other Ambulatory Visit (INDEPENDENT_AMBULATORY_CARE_PROVIDER_SITE_OTHER): Payer: Self-pay | Admitting: Internal Medicine

## 2017-07-17 LAB — HEMOGLOBIN A1C
EAG (MMOL/L): 9.2 (calc)
HEMOGLOBIN A1C: 7.4 %{Hb} — AB (ref <5.7)
MEAN PLASMA GLUCOSE: 166 (calc)

## 2017-07-17 LAB — RENAL FUNCTION PANEL
ALBUMIN MSPROF: 3.6 g/dL (ref 3.6–5.1)
BUN/Creatinine Ratio: 16 (calc) (ref 6–22)
BUN: 20 mg/dL (ref 7–25)
CO2: 26 mmol/L (ref 20–32)
Calcium: 8.8 mg/dL (ref 8.6–10.3)
Chloride: 101 mmol/L (ref 98–110)
Creat: 1.26 mg/dL — ABNORMAL HIGH (ref 0.70–1.25)
Glucose, Bld: 245 mg/dL — ABNORMAL HIGH (ref 65–99)
Phosphorus: 3.9 mg/dL (ref 2.1–4.3)
Potassium: 4.7 mmol/L (ref 3.5–5.3)
SODIUM: 138 mmol/L (ref 135–146)

## 2017-07-21 ENCOUNTER — Other Ambulatory Visit: Payer: Self-pay

## 2017-07-21 MED ORDER — GLUCOSE BLOOD VI STRP: ORAL_STRIP | 5 refills | Status: DC

## 2017-07-21 MED ORDER — ACCU-CHEK SOFTCLIX LANCETS MISC: 5 refills | Status: DC

## 2017-07-21 MED ORDER — INSULIN DETEMIR 100 UNIT/ML FLEXPEN: 40.0000 [IU] | PEN_INJECTOR | Freq: Every day | SUBCUTANEOUS | 2 refills | Status: DC

## 2017-07-22 ENCOUNTER — Other Ambulatory Visit: Payer: Self-pay

## 2017-07-22 MED ORDER — INSULIN DETEMIR 100 UNIT/ML FLEXPEN: 40.0000 [IU] | PEN_INJECTOR | Freq: Every day | SUBCUTANEOUS | 2 refills | Status: DC

## 2017-07-22 MED ORDER — INSULIN PEN NEEDLE 31G X 8 MM MISC: 0 refills | Status: DC

## 2017-07-23 ENCOUNTER — Other Ambulatory Visit: Payer: Self-pay

## 2017-07-23 ENCOUNTER — Telehealth: Payer: Self-pay | Admitting: "Endocrinology

## 2017-07-23 MED ORDER — INSULIN DETEMIR 100 UNIT/ML FLEXPEN: 40.0000 [IU] | PEN_INJECTOR | Freq: Every day | SUBCUTANEOUS | 2 refills | Status: DC

## 2017-07-23 NOTE — Telephone Encounter (Signed)
Katie from [place] is calling stating that Severino is also needing a refill on Insulin please advise?

## 2017-07-27 ENCOUNTER — Ambulatory Visit (INDEPENDENT_AMBULATORY_CARE_PROVIDER_SITE_OTHER): Payer: Medicare Other | Admitting: "Endocrinology

## 2017-07-27 ENCOUNTER — Encounter: Payer: Self-pay | Admitting: "Endocrinology

## 2017-07-27 VITALS — BP 113/69 | HR 73 | Ht 67.0 in

## 2017-07-27 DIAGNOSIS — I10 Essential (primary) hypertension: Secondary | ICD-10-CM | POA: Diagnosis not present

## 2017-07-27 DIAGNOSIS — I6523 Occlusion and stenosis of bilateral carotid arteries: Secondary | ICD-10-CM | POA: Diagnosis not present

## 2017-07-27 DIAGNOSIS — E1159 Type 2 diabetes mellitus with other circulatory complications: Secondary | ICD-10-CM | POA: Diagnosis not present

## 2017-07-27 DIAGNOSIS — E782 Mixed hyperlipidemia: Secondary | ICD-10-CM | POA: Diagnosis not present

## 2017-07-27 MED ORDER — INSULIN DETEMIR 100 UNIT/ML FLEXPEN: 50.0000 [IU] | PEN_INJECTOR | Freq: Every day | SUBCUTANEOUS | 2 refills | Status: DC
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
## 2017-07-27 NOTE — Patient Instructions (Signed)

## 2017-08-11 ENCOUNTER — Ambulatory Visit (INDEPENDENT_AMBULATORY_CARE_PROVIDER_SITE_OTHER): Payer: Medicare Other | Admitting: Urology

## 2017-08-11 DIAGNOSIS — N401 Enlarged prostate with lower urinary tract symptoms: Secondary | ICD-10-CM | POA: Diagnosis not present

## 2017-08-11 DIAGNOSIS — R351 Nocturia: Secondary | ICD-10-CM | POA: Diagnosis not present

## 2017-08-20 ENCOUNTER — Other Ambulatory Visit: Payer: Self-pay | Admitting: Urology

## 2017-09-01 NOTE — Patient Instructions (Signed)
[name]  09/01/2017     @PREFPERIOPPHARMACY @   Your procedure is scheduled on 09/13/2017.  Report to [facility] at 6:15 A.M.  Call this number if you have problems the morning of surgery:  [phone]   Remember:  Do not eat food or drink liquids after midnight.  Take these medicines the morning of surgery with A SIP OF WATER Pacerone, Zyrtec, Proscar, Gabapentin, Hydrocodone if needed, Lisinopril  Metoprolol, Movantik, Protonix  TAKE 1/2 DOSE OF YOUR LEVEMIR NIGHT BEFORE AND MORNING OF PROCEDURE.   NO METFORMIN DAY OF PROCEDURE   Do not wear jewelry, make-up or nail polish.  Do not wear lotions, powders, or perfumes, or deoderant.  Do not shave 48 hours prior to surgery.  Men may shave face and neck.  Do not bring valuables to the hospital.  [facility] is not responsible for any belongings or valuables.  Contacts, dentures or bridgework may not be worn into surgery.  Leave your suitcase in the car.  After surgery it may be brought to your room.  For patients admitted to the hospital, discharge time will be determined by your treatment team.  Patients discharged the day of surgery will not be allowed to drive home.    Please read over the following fact sheets that you were given. Anesthesia Post-op Instructions     PATIENT INSTRUCTIONS POST-ANESTHESIA  IMMEDIATELY FOLLOWING SURGERY:  Do not drive or operate machinery for the first twenty four hours after surgery.  Do not make any important decisions for twenty four hours after surgery or while taking narcotic pain medications or sedatives.  If you develop intractable nausea and vomiting or a severe headache please notify your doctor immediately.  FOLLOW-UP:  Please make an appointment with your surgeon as instructed. You do not need to follow up with anesthesia unless specifically instructed to do so.  WOUND CARE INSTRUCTIONS (if applicable):  Keep a dry clean dressing on the anesthesia/puncture wound site if  there is drainage.  Once the wound has quit draining you may leave it open to air.  Generally you should leave the bandage intact for twenty four hours unless there is drainage.  If the epidural site drains for more than 36-48 hours please call the anesthesia department.  QUESTIONS?:  Please feel free to call your physician or the hospital operator if you have any questions, and they will be happy to assist you.      Cystoscopy Cystoscopy is a procedure that is used to help diagnose and sometimes treat conditions that affect that lower urinary tract. The lower urinary tract includes the bladder and the tube that drains urine from the bladder out of the body (urethra). Cystoscopy is performed with a thin, tube-shaped instrument with a light and camera at the end (cystoscope). The cystoscope may be hard (rigid) or flexible, depending on the goal of the procedure.The cystoscope is inserted through the urethra, into the bladder. Cystoscopy may be recommended if you have:  Urinary tractinfections that keep coming back (recurring).  Blood in the urine (hematuria).  Loss of bladder control (urinary incontinence) or an overactive bladder.  Unusual cells found in a urine sample.  A blockage in the urethra.  Painful urination.  An abnormality in the bladder found during an intravenous pyelogram (IVP) or CT scan.  Cystoscopy may also be done to remove a sample of tissue to be examined under a microscope (biopsy). Tell a health care provider about:  Any allergies you have.  All  medicines you are taking, including vitamins, herbs, eye drops, creams, and over-the-counter medicines.  Any problems you or family members have had with anesthetic medicines.  Any blood disorders you have.  Any surgeries you have had.  Any medical conditions you have.  Whether you are pregnant or may be pregnant. What are the risks? Generally, this is a safe procedure. However, problems may occur,  including:  Infection.  Bleeding.  Allergic reactions to medicines.  Damage to other structures or organs.  What happens before the procedure?  Ask your health care provider about: ? Changing or stopping your regular medicines. This is especially important if you are taking diabetes medicines or blood thinners. ? Taking medicines such as aspirin and ibuprofen. These medicines can thin your blood. Do not take these medicines before your procedure if your health care provider instructs you not to.  Follow instructions from your health care provider about eating or drinking restrictions.  You may be given antibiotic medicine to help prevent infection.  You may have an exam or testing, such as X-rays of the bladder, urethra, or kidneys.  You may have urine tests to check for signs of infection.  Plan to have someone take you home after the procedure. What happens during the procedure?  To reduce your risk of infection,your health care team will wash or sanitize their hands.  You will be given one or more of the following: ? A medicine to help you relax (sedative). ? A medicine to numb the area (local anesthetic).  The area around the opening of your urethra will be cleaned.  The cystoscope will be passed through your urethra into your bladder.  Germ-free (sterile)fluid will flow through the cystoscope to fill your bladder. The fluid will stretch your bladder so that your surgeon can clearly examine your bladder walls.  The cystoscope will be removed and your bladder will be emptied. The procedure may vary among health care providers and hospitals. What happens after the procedure?  You may have some soreness or pain in your abdomen and urethra. Medicines will be available to help you.  You may have some blood in your urine.  Do not drive for 24 hours if you received a sedative. This information is not intended to replace advice given to you by your health care provider.  Make sure you discuss any questions you have with your health care provider. Document Released: 09/25/2000 Document Revised: 02/06/2016 Document Reviewed: 08/15/2015 Elsevier Interactive Patient Education  2017 [employer].

## 2017-09-07 ENCOUNTER — Encounter (HOSPITAL_COMMUNITY)
Admission: RE | Admit: 2017-09-07 | Discharge: 2017-09-07 | Disposition: A | Discharge status: Home / Self Care | Payer: Medicare Other | Source: Ambulatory Visit | Attending: Urology | Admitting: Urology

## 2017-09-13 ENCOUNTER — Encounter (HOSPITAL_COMMUNITY): Admission: RE | Payer: Self-pay | Source: Ambulatory Visit

## 2017-09-13 ENCOUNTER — Ambulatory Visit (HOSPITAL_COMMUNITY): Admission: RE | Admit: 2017-09-13 | Payer: Medicare Other | Source: Ambulatory Visit | Admitting: Urology

## 2017-09-13 SURGERY — CYSTOSCOPY WITH INSERTION OF UROLIFT
Anesthesia: General

## 2017-09-23 ENCOUNTER — Other Ambulatory Visit: Payer: Self-pay | Admitting: *Deleted

## 2017-09-23 NOTE — Telephone Encounter (Signed)
Patients wife left a msg on the refill vm requesting a refill on nitro for her husband. She stated that he may possibly need an appointment with Dr Stanford Breed to discuss this. She would like a call back at [phone]. Thanks, MI

## 2017-09-23 NOTE — Telephone Encounter (Signed)
Spoke with pt, he reports having chest pain for the last 2 months. He reports waking with pain daily and taking 2 NTG to get relief but he reports it comes back. He reports currently he is having bad pain in his left arm. I asked the patient why he has not gone to the hospital before now and he replied, " I am waiting to cro[place]." I explained to the patient his symptoms are concerning and he needs to call 911 and go to the ER. The patient requested a refill on his NTG and I told the patient again he will need to go to the ER. Patient voiced understanding and hung up.

## 2017-09-27 ENCOUNTER — Other Ambulatory Visit: Payer: Self-pay

## 2017-09-27 ENCOUNTER — Other Ambulatory Visit: Payer: Self-pay | Admitting: Vascular Surgery

## 2017-09-27 ENCOUNTER — Other Ambulatory Visit: Payer: Self-pay | Admitting: "Endocrinology

## 2017-09-27 DIAGNOSIS — Z95828 Presence of other vascular implants and grafts: Secondary | ICD-10-CM

## 2017-09-27 MED ORDER — CLOPIDOGREL BISULFATE 75 MG PO TABS: 75.0000 mg | ORAL_TABLET | Freq: Every day | ORAL | 5 refills | Status: DC

## 2017-09-27 MED ORDER — METFORMIN HCL 1000 MG PO TABS: 1000.0000 mg | ORAL_TABLET | Freq: Two times a day (BID) | ORAL | 2 refills | Status: DC

## 2017-10-06 ENCOUNTER — Other Ambulatory Visit: Payer: Self-pay | Admitting: "Endocrinology

## 2017-10-27 ENCOUNTER — Ambulatory Visit: Payer: Medicare Other | Admitting: "Endocrinology

## 2017-11-11 ENCOUNTER — Ambulatory Visit: Payer: Medicare Other | Admitting: "Endocrinology

## 2017-11-12 ENCOUNTER — Other Ambulatory Visit: Payer: Self-pay | Admitting: Cardiology

## 2017-11-12 LAB — COMPLETE METABOLIC PANEL WITH GFR
AG Ratio: 1.2 (calc) (ref 1.0–2.5)
ALBUMIN MSPROF: 3.8 g/dL (ref 3.6–5.1)
ALKALINE PHOSPHATASE (APISO): 71 U/L (ref 40–115)
ALT: 10 U/L (ref 9–46)
AST: 12 U/L (ref 10–35)
BUN: 13 mg/dL (ref 7–25)
CO2: 30 mmol/L (ref 20–32)
CREATININE: 0.94 mg/dL (ref 0.70–1.25)
Calcium: 9.1 mg/dL (ref 8.6–10.3)
Chloride: 104 mmol/L (ref 98–110)
GFR, EST AFRICAN AMERICAN: 98 mL/min/{1.73_m2} (ref >=60)
GFR, Est Non African American: 85 mL/min/{1.73_m2} (ref >=60)
GLOBULIN: 3.2 g/dL (ref 1.9–3.7)
Glucose, Bld: 76 mg/dL (ref 65–99)
Potassium: 4.2 mmol/L (ref 3.5–5.3)
SODIUM: 140 mmol/L (ref 135–146)
TOTAL PROTEIN: 7 g/dL (ref 6.1–8.1)
Total Bilirubin: 0.4 mg/dL (ref 0.2–1.2)

## 2017-11-12 LAB — LIPID PANEL
CHOL/HDL RATIO: 3.2 (calc) (ref <5.0)
Cholesterol: 114 mg/dL (ref <200)
HDL: 36 mg/dL — ABNORMAL LOW (ref >40)
LDL CHOLESTEROL (CALC): 56 mg/dL
NON-HDL CHOLESTEROL (CALC): 78 mg/dL (ref <130)
Triglycerides: 137 mg/dL (ref <150)

## 2017-11-12 LAB — HEMOGLOBIN A1C
EAG (MMOL/L): 8.7 (calc)
Hgb A1c MFr Bld: 7.1 % of total Hgb — ABNORMAL HIGH (ref <5.7)
Mean Plasma Glucose: 157 (calc)

## 2017-11-12 NOTE — Telephone Encounter (Signed)
REFILL 

## 2017-11-15 ENCOUNTER — Ambulatory Visit (INDEPENDENT_AMBULATORY_CARE_PROVIDER_SITE_OTHER): Payer: Medicare Other | Admitting: "Endocrinology

## 2017-11-15 ENCOUNTER — Encounter: Payer: Self-pay | Admitting: "Endocrinology

## 2017-11-15 VITALS — BP 117/77 | HR 62 | Ht 67.0 in

## 2017-11-15 DIAGNOSIS — E782 Mixed hyperlipidemia: Secondary | ICD-10-CM | POA: Diagnosis not present

## 2017-11-15 DIAGNOSIS — E1159 Type 2 diabetes mellitus with other circulatory complications: Secondary | ICD-10-CM

## 2017-11-15 DIAGNOSIS — F17200 Nicotine dependence, unspecified, uncomplicated: Secondary | ICD-10-CM | POA: Diagnosis not present

## 2017-11-15 DIAGNOSIS — I10 Essential (primary) hypertension: Secondary | ICD-10-CM | POA: Diagnosis not present

## 2017-11-15 MED ORDER — INSULIN DETEMIR 100 UNIT/ML FLEXPEN: 40.0000 [IU] | PEN_INJECTOR | Freq: Every day | SUBCUTANEOUS | 2 refills | Status: DC
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
## 2017-12-02 ENCOUNTER — Other Ambulatory Visit: Payer: Self-pay | Admitting: "Endocrinology

## 2017-12-15 ENCOUNTER — Encounter (HOSPITAL_COMMUNITY): Payer: Medicare Other

## 2017-12-15 ENCOUNTER — Ambulatory Visit: Payer: Medicare Other | Admitting: Vascular Surgery

## 2017-12-31 ENCOUNTER — Other Ambulatory Visit (INDEPENDENT_AMBULATORY_CARE_PROVIDER_SITE_OTHER): Payer: Self-pay | Admitting: Internal Medicine

## 2018-02-17 ENCOUNTER — Other Ambulatory Visit: Payer: Self-pay

## 2018-02-17 MED ORDER — INSULIN DETEMIR 100 UNIT/ML FLEXPEN: 40.0000 [IU] | PEN_INJECTOR | Freq: Every day | SUBCUTANEOUS | 2 refills | Status: DC

## 2018-02-18 ENCOUNTER — Other Ambulatory Visit: Payer: Self-pay | Admitting: "Endocrinology

## 2018-02-18 ENCOUNTER — Telehealth: Payer: Self-pay | Admitting: Cardiology

## 2018-02-18 MED ORDER — AMIODARONE HCL 200 MG PO TABS: 200.0000 mg | ORAL_TABLET | Freq: Every day | ORAL | 0 refills | Status: DC

## 2018-02-18 NOTE — Telephone Encounter (Signed)
New Message    *STAT* If patient is at the pharmacy, call can be transferred to refill team.   1. Which medications need to be refilled? (please list name of each medication and dose if known)   amiodarone (PACERONE) 200 MG tablet   2. Which pharmacy/location (including street and city if local pharmacy) is medication to be sent to? [facility]   3. Do they need a 30 day or 90 day supply? 90 day supply

## 2018-02-21 ENCOUNTER — Other Ambulatory Visit: Payer: Self-pay | Admitting: Vascular Surgery

## 2018-02-21 DIAGNOSIS — Z95828 Presence of other vascular implants and grafts: Secondary | ICD-10-CM

## 2018-03-16 ENCOUNTER — Ambulatory Visit: Payer: Medicare Other | Admitting: "Endocrinology

## 2018-03-31 ENCOUNTER — Other Ambulatory Visit: Payer: Self-pay | Admitting: "Endocrinology

## 2018-04-06 LAB — HEMOGLOBIN A1C
Hgb A1c MFr Bld: 7.4 % of total Hgb — ABNORMAL HIGH (ref <5.7)
MEAN PLASMA GLUCOSE: 166 (calc)
eAG (mmol/L): 9.2 (calc)

## 2018-04-06 LAB — COMPLETE METABOLIC PANEL WITH GFR
AG Ratio: 1.1 (calc) (ref 1.0–2.5)
ALKALINE PHOSPHATASE (APISO): 82 U/L (ref 40–115)
ALT: 7 U/L — ABNORMAL LOW (ref 9–46)
AST: 11 U/L (ref 10–35)
Albumin: 3.8 g/dL (ref 3.6–5.1)
BILIRUBIN TOTAL: 0.4 mg/dL (ref 0.2–1.2)
BUN: 14 mg/dL (ref 7–25)
CHLORIDE: 101 mmol/L (ref 98–110)
CO2: 30 mmol/L (ref 20–32)
Calcium: 9.1 mg/dL (ref 8.6–10.3)
Creat: 0.99 mg/dL (ref 0.70–1.25)
GFR, Est African American: 92 mL/min/{1.73_m2} (ref >=60)
GFR, Est Non African American: 80 mL/min/{1.73_m2} (ref >=60)
GLOBULIN: 3.5 g/dL (ref 1.9–3.7)
Glucose, Bld: 142 mg/dL — ABNORMAL HIGH (ref 65–99)
Potassium: 4.4 mmol/L (ref 3.5–5.3)
Sodium: 138 mmol/L (ref 135–146)
Total Protein: 7.3 g/dL (ref 6.1–8.1)

## 2018-04-11 ENCOUNTER — Ambulatory Visit (INDEPENDENT_AMBULATORY_CARE_PROVIDER_SITE_OTHER): Payer: Medicare Other | Admitting: "Endocrinology

## 2018-04-11 ENCOUNTER — Encounter: Payer: Self-pay | Admitting: "Endocrinology

## 2018-04-11 VITALS — BP 139/78 | HR 79

## 2018-04-11 DIAGNOSIS — E1159 Type 2 diabetes mellitus with other circulatory complications: Secondary | ICD-10-CM

## 2018-04-11 DIAGNOSIS — E782 Mixed hyperlipidemia: Secondary | ICD-10-CM | POA: Diagnosis not present

## 2018-04-11 DIAGNOSIS — F17200 Nicotine dependence, unspecified, uncomplicated: Secondary | ICD-10-CM

## 2018-04-11 DIAGNOSIS — I10 Essential (primary) hypertension: Secondary | ICD-10-CM | POA: Diagnosis not present
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
## 2018-04-11 NOTE — Patient Instructions (Signed)

## 2018-05-17 ENCOUNTER — Other Ambulatory Visit: Payer: Self-pay

## 2018-05-17 MED ORDER — METFORMIN HCL 1000 MG PO TABS: 1000.0000 mg | ORAL_TABLET | Freq: Two times a day (BID) | ORAL | 3 refills | Status: DC

## 2018-06-05 ENCOUNTER — Other Ambulatory Visit: Payer: Self-pay | Admitting: "Endocrinology

## 2018-06-30 ENCOUNTER — Other Ambulatory Visit (INDEPENDENT_AMBULATORY_CARE_PROVIDER_SITE_OTHER): Payer: Self-pay | Admitting: Internal Medicine

## 2018-07-06 ENCOUNTER — Telehealth: Payer: Self-pay | Admitting: Cardiology

## 2018-07-06 NOTE — Telephone Encounter (Signed)
PT NEEDS APPT, DID NOT REFILL 21 MONTHS SIN LAST OV-SENT MESSAGE TO SCHEDULING TO CALL PT AND SCHEDULE APPT FOR REFILLS

## 2018-07-06 NOTE — Telephone Encounter (Signed)
° ° ° °*  STAT* If patient is at the pharmacy, call can be transferred to refill team.   1. Which medications need to be refilled? (please list name of each medication and dose if known)  amiodarone (PACERONE) 200 MG tablet, metoprolol succinate (TOPROL-XL) 25 MG 24 hr tablet  2. Which pharmacy/location (including street and city if local pharmacy) is medication to be sent to? [facility] - [address], [place], [place]  3. Do they need a 30 day or 90 day supply? [place]

## 2018-07-07 MED ORDER — METOPROLOL SUCCINATE ER 25 MG PO TB24: ORAL_TABLET | ORAL | 0 refills | Status: DC

## 2018-07-07 MED ORDER — AMIODARONE HCL 200 MG PO TABS: 200.0000 mg | ORAL_TABLET | Freq: Every day | ORAL | 0 refills | Status: DC

## 2018-07-28 ENCOUNTER — Encounter: Payer: Self-pay | Admitting: "Endocrinology

## 2018-08-09 LAB — COMPLETE METABOLIC PANEL WITH GFR
AG Ratio: 1.1 (calc) (ref 1.0–2.5)
ALBUMIN MSPROF: 3.7 g/dL (ref 3.6–5.1)
ALT: 9 U/L (ref 9–46)
AST: 13 U/L (ref 10–35)
Alkaline phosphatase (APISO): 74 U/L (ref 40–115)
BILIRUBIN TOTAL: 0.6 mg/dL (ref 0.2–1.2)
BUN: 19 mg/dL (ref 7–25)
CO2: 31 mmol/L (ref 20–32)
Calcium: 8.9 mg/dL (ref 8.6–10.3)
Chloride: 101 mmol/L (ref 98–110)
Creat: 1.04 mg/dL (ref 0.70–1.25)
GFR, EST AFRICAN AMERICAN: 86 mL/min/{1.73_m2} (ref >=60)
GFR, Est Non African American: 74 mL/min/{1.73_m2} (ref >=60)
GLUCOSE: 145 mg/dL — AB (ref 65–139)
Globulin: 3.5 g/dL (calc) (ref 1.9–3.7)
Potassium: 4.2 mmol/L (ref 3.5–5.3)
SODIUM: 136 mmol/L (ref 135–146)
TOTAL PROTEIN: 7.2 g/dL (ref 6.1–8.1)

## 2018-08-09 LAB — HEMOGLOBIN A1C
HEMOGLOBIN A1C: 7.3 %{Hb} — AB (ref <5.7)
Mean Plasma Glucose: 163 (calc)
eAG (mmol/L): 9 (calc)

## 2018-08-09 LAB — MICROALBUMIN / CREATININE URINE RATIO
CREATININE, URINE: 24 mg/dL (ref 20–320)
MICROALB UR: 0.4 mg/dL
MICROALB/CREAT RATIO: 17 ug/mg{creat} (ref <30)

## 2018-08-15 ENCOUNTER — Ambulatory Visit: Payer: Medicare Other | Admitting: "Endocrinology

## 2018-08-16 ENCOUNTER — Ambulatory Visit: Payer: Medicare Other | Admitting: "Endocrinology
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
## 2018-08-29 ENCOUNTER — Other Ambulatory Visit: Payer: Self-pay | Admitting: Vascular Surgery

## 2018-08-29 DIAGNOSIS — Z95828 Presence of other vascular implants and grafts: Secondary | ICD-10-CM

## 2018-08-30 ENCOUNTER — Encounter: Payer: Self-pay | Admitting: Cardiology

## 2018-08-30 ENCOUNTER — Ambulatory Visit (INDEPENDENT_AMBULATORY_CARE_PROVIDER_SITE_OTHER): Payer: Medicare Other | Admitting: Cardiology

## 2018-08-30 VITALS — BP 124/60 | HR 40 | Ht 66.0 in | Wt 268.4 lb

## 2018-08-30 DIAGNOSIS — I4892 Unspecified atrial flutter: Secondary | ICD-10-CM

## 2018-08-30 DIAGNOSIS — I2510 Atherosclerotic heart disease of native coronary artery without angina pectoris: Secondary | ICD-10-CM | POA: Diagnosis not present

## 2018-08-30 DIAGNOSIS — I10 Essential (primary) hypertension: Secondary | ICD-10-CM

## 2018-08-30 DIAGNOSIS — E7800 Pure hypercholesterolemia, unspecified: Secondary | ICD-10-CM

## 2018-08-30 DIAGNOSIS — I255 Ischemic cardiomyopathy: Secondary | ICD-10-CM | POA: Diagnosis not present

## 2018-08-30 MED ORDER — NITROGLYCERIN 0.4 MG SL SUBL: 0.4000 mg | SUBLINGUAL_TABLET | SUBLINGUAL | 3 refills | Status: DC | PRN

## 2018-08-30 NOTE — Addendum Note (Signed)
Addended by: Diana Eves on: 08/30/2018 05:14 PM   Modules accepted: Orders

## 2018-08-30 NOTE — Patient Instructions (Addendum)
Medication Instructions:  STOP METOPROLOL If you need a refill on your cardiac medications before your next appointment, please call your pharmacy.   Lab work: Your physician recommends that you return for lab work PRIOR TO EATING If you have labs (blood work) drawn today and your tests are completely normal, you will receive your results only by: Marland Kitchen MyChart Message (if you have MyChart) OR . A paper copy in the mail If you have any lab test that is abnormal or we need to change your treatment, we will call you to review the results.  Testing/Procedures: A chest x-ray takes a picture of the organs and structures inside the chest, including the heart, lungs, and blood vessels. This test can show several things, including, whether the heart is enlarges; whether fluid is building up in the lungs; and whether pacemaker / defibrillator leads are still in place. AT [facility]  Your physician has requested that you have an echocardiogram. Echocardiography is a painless test that uses sound waves to create images of your heart. It provides your doctor with information about the size and shape of your heart and how well your heart's chambers and valves are working. This procedure takes approximately one hour. There are no restrictions for this procedure.AT [place] OFFICE    Follow-Up: Your physician recommends that you schedule a follow-up appointment in: [place] APP  Your physician recommends that you schedule a follow-up appointment in: [place]

## 2018-08-31 ENCOUNTER — Other Ambulatory Visit: Payer: Self-pay | Admitting: *Deleted

## 2018-08-31 DIAGNOSIS — I4892 Unspecified atrial flutter: Secondary | ICD-10-CM

## 2018-08-31 DIAGNOSIS — I255 Ischemic cardiomyopathy: Secondary | ICD-10-CM

## 2018-08-31 MED ORDER — NITROGLYCERIN 0.4 MG SL SUBL: 0.4000 mg | SUBLINGUAL_TABLET | SUBLINGUAL | 3 refills | Status: DC | PRN

## 2018-09-15 ENCOUNTER — Other Ambulatory Visit (HOSPITAL_COMMUNITY): Payer: Medicare Other

## 2018-09-16 ENCOUNTER — Ambulatory Visit (HOSPITAL_COMMUNITY)
Admission: RE | Admit: 2018-09-16 | Discharge: 2018-09-16 | Disposition: A | Discharge status: Home / Self Care | Payer: Medicare Other | Source: Ambulatory Visit | Attending: Cardiology | Admitting: Cardiology

## 2018-09-16 DIAGNOSIS — I4892 Unspecified atrial flutter: Secondary | ICD-10-CM

## 2018-09-16 DIAGNOSIS — I255 Ischemic cardiomyopathy: Secondary | ICD-10-CM | POA: Diagnosis present

## 2018-09-16 MED ORDER — PERFLUTREN LIPID MICROSPHERE
1.0000 mL | INTRAVENOUS | Status: DC | PRN
  Administered 2018-09-16: 2 mL via INTRAVENOUS
  Filled 2018-09-16: qty 10

## 2018-09-16 NOTE — Progress Notes (Signed)
*  PRELIMINARY RESULTS* Echocardiogram 2D Echocardiogram with definity has been performed.  Leavy Cella 09/16/2018, 12:58 PM

## 2018-09-21 ENCOUNTER — Ambulatory Visit (INDEPENDENT_AMBULATORY_CARE_PROVIDER_SITE_OTHER): Payer: Medicare Other | Admitting: "Endocrinology

## 2018-09-21 ENCOUNTER — Encounter: Payer: Self-pay | Admitting: "Endocrinology

## 2018-09-21 VITALS — BP 128/70 | HR 64

## 2018-09-21 DIAGNOSIS — E782 Mixed hyperlipidemia: Secondary | ICD-10-CM

## 2018-09-21 DIAGNOSIS — E1159 Type 2 diabetes mellitus with other circulatory complications: Secondary | ICD-10-CM

## 2018-09-21 DIAGNOSIS — I255 Ischemic cardiomyopathy: Secondary | ICD-10-CM | POA: Diagnosis not present

## 2018-09-21 DIAGNOSIS — I10 Essential (primary) hypertension: Secondary | ICD-10-CM

## 2018-09-21 NOTE — Patient Instructions (Signed)
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
## 2018-09-22 ENCOUNTER — Encounter: Payer: Self-pay | Admitting: *Deleted

## 2018-09-22 LAB — LIPID PANEL
Chol/HDL Ratio: 3.6 ratio (ref 0.0–5.0)
Cholesterol, Total: 125 mg/dL (ref 100–199)
HDL: 35 mg/dL — AB (ref >39)
LDL Calculated: 65 mg/dL (ref 0–99)
Triglycerides: 125 mg/dL (ref 0–149)
VLDL Cholesterol Cal: 25 mg/dL (ref 5–40)

## 2018-09-22 LAB — TSH: TSH: 3.97 u[IU]/mL (ref 0.450–4.500)

## 2018-09-28 ENCOUNTER — Other Ambulatory Visit: Payer: Self-pay | Admitting: Vascular Surgery

## 2018-09-28 DIAGNOSIS — Z95828 Presence of other vascular implants and grafts: Secondary | ICD-10-CM

## 2018-09-30 ENCOUNTER — Other Ambulatory Visit: Payer: Self-pay | Admitting: "Endocrinology

## 2018-10-28 ENCOUNTER — Other Ambulatory Visit: Payer: Self-pay | Admitting: Cardiology

## 2018-11-08 ENCOUNTER — Other Ambulatory Visit: Payer: Self-pay | Admitting: "Endocrinology

## 2018-11-18 ENCOUNTER — Other Ambulatory Visit: Payer: Self-pay

## 2018-11-18 DIAGNOSIS — I6523 Occlusion and stenosis of bilateral carotid arteries: Secondary | ICD-10-CM

## 2018-11-23 ENCOUNTER — Other Ambulatory Visit: Payer: Self-pay

## 2018-11-23 ENCOUNTER — Encounter: Payer: Self-pay | Admitting: Vascular Surgery

## 2018-11-23 ENCOUNTER — Ambulatory Visit (INDEPENDENT_AMBULATORY_CARE_PROVIDER_SITE_OTHER): Payer: Medicare Other | Admitting: Vascular Surgery

## 2018-11-23 ENCOUNTER — Ambulatory Visit (HOSPITAL_COMMUNITY)
Admission: RE | Admit: 2018-11-23 | Discharge: 2018-11-23 | Disposition: A | Discharge status: Home / Self Care | Payer: Medicare Other | Source: Ambulatory Visit | Attending: Family | Admitting: Family

## 2018-11-23 VITALS — BP 106/53 | HR 52 | Temp 97.6°F | Resp 20 | Ht 66.0 in | Wt 268.0 lb

## 2018-11-23 DIAGNOSIS — I6523 Occlusion and stenosis of bilateral carotid arteries: Secondary | ICD-10-CM | POA: Diagnosis present

## 2018-11-23 DIAGNOSIS — I739 Peripheral vascular disease, unspecified: Secondary | ICD-10-CM | POA: Diagnosis not present
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
## 2018-12-20 ENCOUNTER — Other Ambulatory Visit: Payer: Self-pay | Admitting: "Endocrinology

## 2018-12-20 ENCOUNTER — Telehealth: Payer: Self-pay

## 2018-12-20 MED ORDER — INSULIN GLARGINE 100 UNIT/ML SOLOSTAR PEN: 40.0000 [IU] | PEN_INJECTOR | Freq: Every day | SUBCUTANEOUS | 2 refills | Status: DC

## 2018-12-20 NOTE — Telephone Encounter (Signed)
[name] is calling on behalf of [name] stating that he has started breaking out in a rash at his injection sites with his Insulin Detemir (LEVEMIR FLEXTOUCH) 100 UNIT/ML Pen please advise?

## 2018-12-20 NOTE — Telephone Encounter (Signed)
Changing to Lantus Dr. Dorris Fetch is sending to the pharmacy

## 2018-12-20 NOTE — Telephone Encounter (Signed)
Change to a new Levemir pen , change the needles during each injection. Call back if rash at injection site continues.

## 2018-12-22 ENCOUNTER — Other Ambulatory Visit: Payer: Self-pay

## 2018-12-22 ENCOUNTER — Encounter (HOSPITAL_COMMUNITY): Payer: Self-pay | Admitting: Emergency Medicine

## 2018-12-22 ENCOUNTER — Emergency Department (HOSPITAL_COMMUNITY)
Admission: EM | Admit: 2018-12-22 | Discharge: 2018-12-22 | Disposition: A | Discharge status: Home / Self Care | Payer: Medicare Other | Attending: Emergency Medicine | Admitting: Emergency Medicine

## 2018-12-22 DIAGNOSIS — R55 Syncope and collapse: Secondary | ICD-10-CM | POA: Insufficient documentation

## 2018-12-22 DIAGNOSIS — Z951 Presence of aortocoronary bypass graft: Secondary | ICD-10-CM | POA: Diagnosis not present

## 2018-12-22 DIAGNOSIS — I252 Old myocardial infarction: Secondary | ICD-10-CM | POA: Insufficient documentation

## 2018-12-22 DIAGNOSIS — Y929 Unspecified place or not applicable: Secondary | ICD-10-CM | POA: Diagnosis not present

## 2018-12-22 DIAGNOSIS — Z794 Long term (current) use of insulin: Secondary | ICD-10-CM | POA: Diagnosis not present

## 2018-12-22 DIAGNOSIS — Z79899 Other long term (current) drug therapy: Secondary | ICD-10-CM | POA: Diagnosis not present

## 2018-12-22 DIAGNOSIS — Y939 Activity, unspecified: Secondary | ICD-10-CM | POA: Insufficient documentation

## 2018-12-22 DIAGNOSIS — F17210 Nicotine dependence, cigarettes, uncomplicated: Secondary | ICD-10-CM | POA: Insufficient documentation

## 2018-12-22 DIAGNOSIS — Z7902 Long term (current) use of antithrombotics/antiplatelets: Secondary | ICD-10-CM | POA: Diagnosis not present

## 2018-12-22 DIAGNOSIS — I10 Essential (primary) hypertension: Secondary | ICD-10-CM | POA: Insufficient documentation

## 2018-12-22 DIAGNOSIS — E119 Type 2 diabetes mellitus without complications: Secondary | ICD-10-CM | POA: Diagnosis not present

## 2018-12-22 DIAGNOSIS — W1789XA Other fall from one level to another, initial encounter: Secondary | ICD-10-CM | POA: Insufficient documentation

## 2018-12-22 DIAGNOSIS — W19XXXA Unspecified fall, initial encounter: Secondary | ICD-10-CM

## 2018-12-22 DIAGNOSIS — I2510 Atherosclerotic heart disease of native coronary artery without angina pectoris: Secondary | ICD-10-CM | POA: Diagnosis not present

## 2018-12-22 DIAGNOSIS — Y999 Unspecified external cause status: Secondary | ICD-10-CM | POA: Diagnosis not present

## 2018-12-22 LAB — BASIC METABOLIC PANEL
Anion gap: 9 (ref 5–15)
BUN: 13 mg/dL (ref 8–23)
CO2: 25 mmol/L (ref 22–32)
Calcium: 8.1 mg/dL — ABNORMAL LOW (ref 8.9–10.3)
Chloride: 101 mmol/L (ref 98–111)
Creatinine, Ser: 0.86 mg/dL (ref 0.61–1.24)
GFR calc Af Amer: >60 mL/min (ref >60)
GFR calc non Af Amer: >60 mL/min (ref >60)
Glucose, Bld: 171 mg/dL — ABNORMAL HIGH (ref 70–99)
Potassium: 3.6 mmol/L (ref 3.5–5.1)
Sodium: 135 mmol/L (ref 135–145)

## 2018-12-22 LAB — CBC
HCT: 30.7 % — ABNORMAL LOW (ref 39.0–52.0)
Hemoglobin: 9.8 g/dL — ABNORMAL LOW (ref 13.0–17.0)
MCH: 29.3 pg (ref 26.0–34.0)
MCHC: 31.9 g/dL (ref 30.0–36.0)
MCV: 91.9 fL (ref 80.0–100.0)
Platelets: 300 10*3/uL (ref 150–400)
RBC: 3.34 MIL/uL — ABNORMAL LOW (ref 4.22–5.81)
RDW: 13.6 % (ref 11.5–15.5)
WBC: 11.9 10*3/uL — ABNORMAL HIGH (ref 4.0–10.5)
nRBC: 0 % (ref 0.0–0.2)

## 2018-12-22 LAB — CBG MONITORING, ED: Glucose-Capillary: 169 mg/dL — ABNORMAL HIGH (ref 70–99)

## 2018-12-22 LAB — OCCULT BLOOD X 1 CARD TO LAB, STOOL: Fecal Occult Bld: NEGATIVE

## 2018-12-22 MED ORDER — SODIUM CHLORIDE 0.9% FLUSH: 3.0000 mL | Freq: Once | INTRAVENOUS | Status: DC

## 2018-12-22 MED ORDER — SODIUM CHLORIDE 0.9 % IV BOLUS
500.0000 mL | Freq: Once | INTRAVENOUS | Status: AC
  Administered 2018-12-22: 500 mL via INTRAVENOUS

## 2018-12-22 NOTE — ED Notes (Signed)
Could not provide urine sample at this time.

## 2018-12-22 NOTE — ED Triage Notes (Signed)
Per EMS patient got up from toilet fast and got light headed and fell. Patient complains of left arm and shoulder pain. Patient states left posterior head pain. He states hit his head on drawers in bathroom. No obvious signs of injury to head. Patient is Alert x 4. History of MI and takes Plavix.

## 2018-12-22 NOTE — ED Notes (Signed)
Ambulate to nurse desk.   Needed assistance from NT. Uses cane at home.   Gait was slow.

## 2018-12-22 NOTE — ED Notes (Signed)
Pt's wife, [name], notified of pt's d/c at this time. States she will bring pt clothing and transport pt home.
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
## 2018-12-27 ENCOUNTER — Other Ambulatory Visit (INDEPENDENT_AMBULATORY_CARE_PROVIDER_SITE_OTHER): Payer: Self-pay | Admitting: Internal Medicine

## 2019-01-07 LAB — HEMOGLOBIN A1C
Hgb A1c MFr Bld: 7.5 % of total Hgb — ABNORMAL HIGH (ref <5.7)
Mean Plasma Glucose: 169 (calc)
eAG (mmol/L): 9.3 (calc)

## 2019-01-07 LAB — COMPLETE METABOLIC PANEL WITH GFR
AG Ratio: 0.9 (calc) — ABNORMAL LOW (ref 1.0–2.5)
ALT: 11 U/L (ref 9–46)
AST: 14 U/L (ref 10–35)
Albumin: 3.2 g/dL — ABNORMAL LOW (ref 3.6–5.1)
Alkaline phosphatase (APISO): 83 U/L (ref 35–144)
BUN: 13 mg/dL (ref 7–25)
CO2: 28 mmol/L (ref 20–32)
Calcium: 8.8 mg/dL (ref 8.6–10.3)
Chloride: 101 mmol/L (ref 98–110)
Creat: 0.97 mg/dL (ref 0.70–1.25)
GFR, Est African American: 94 mL/min/{1.73_m2} (ref >=60)
GFR, Est Non African American: 81 mL/min/{1.73_m2} (ref >=60)
Globulin: 3.6 g/dL (calc) (ref 1.9–3.7)
Glucose, Bld: 179 mg/dL — ABNORMAL HIGH (ref 65–139)
Potassium: 4.1 mmol/L (ref 3.5–5.3)
Sodium: 138 mmol/L (ref 135–146)
Total Bilirubin: 0.3 mg/dL (ref 0.2–1.2)
Total Protein: 6.8 g/dL (ref 6.1–8.1)

## 2019-01-07 LAB — T4, FREE: Free T4: 1.5 ng/dL (ref 0.8–1.8)

## 2019-01-07 LAB — TSH: TSH: 3.12 m[IU]/L (ref 0.40–4.50)

## 2019-01-25 ENCOUNTER — Encounter: Payer: Self-pay | Admitting: "Endocrinology

## 2019-01-26 ENCOUNTER — Ambulatory Visit (INDEPENDENT_AMBULATORY_CARE_PROVIDER_SITE_OTHER): Payer: Medicare Other | Admitting: "Endocrinology

## 2019-01-26 ENCOUNTER — Encounter: Payer: Self-pay | Admitting: "Endocrinology

## 2019-01-26 ENCOUNTER — Other Ambulatory Visit: Payer: Self-pay

## 2019-01-26 DIAGNOSIS — E1159 Type 2 diabetes mellitus with other circulatory complications: Secondary | ICD-10-CM | POA: Diagnosis not present

## 2019-01-26 DIAGNOSIS — E782 Mixed hyperlipidemia: Secondary | ICD-10-CM

## 2019-01-26 MED ORDER — INSULIN GLARGINE 100 UNIT/ML SOLOSTAR PEN: 40.0000 [IU] | PEN_INJECTOR | Freq: Every day | SUBCUTANEOUS | 2 refills | Status: DC

## 2019-01-26 MED ORDER — METFORMIN HCL 1000 MG PO TABS: 1000.0000 mg | ORAL_TABLET | Freq: Two times a day (BID) | ORAL | 3 refills | Status: DC
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
## 2019-02-09 ENCOUNTER — Telehealth: Payer: Self-pay

## 2019-02-09 NOTE — Telephone Encounter (Signed)
I know his shoes is special kind. We may need the specific description of his shoes to write on the prescription. We will address on Monday.

## 2019-02-09 NOTE — Telephone Encounter (Signed)
[name] is calling on behalf of [name] stating that he needs a Written Rx sent to [facility] in [employer] for Diabetic Shoes, his shoes no longer fits correctly and they are rubbing sores on his feet, Fax Rx to [phone], please advise?

## 2019-02-23 ENCOUNTER — Other Ambulatory Visit: Payer: Self-pay | Admitting: Vascular Surgery

## 2019-02-23 DIAGNOSIS — Z95828 Presence of other vascular implants and grafts: Secondary | ICD-10-CM

## 2019-02-24 ENCOUNTER — Other Ambulatory Visit: Payer: Self-pay | Admitting: Urology

## 2019-03-03 NOTE — Patient Instructions (Signed)
[name]  03/03/2019     @PREFPERIOPPHARMACY @   Your procedure is scheduled on  03/13/2019 .  Report to [facility] at  [email or web address].  Call this number if you have problems the morning of surgery:  [phone]   Remember:  Do not eat or drink after midnight.                         Take these medicines the morning of surgery with A SIP OF WATER  Amiodarone, proscar, gabapentin, dilaudid(if needed), skelaxin(if needed), protonix. Take 1/2 of her usual night time insulin the night before your procedure. DO NOT take any medications for diabetes the morning of your procedure.    Do not wear jewelry, make-up or nail polish.  Do not wear lotions, powders, or perfumes, or deodorant.  Do not shave 48 hours prior to surgery.  Men may shave face and neck.  Do not bring valuables to the hospital.  [facility] is not responsible for any belongings or valuables.  Contacts, dentures or bridgework may not be worn into surgery.  Leave your suitcase in the car.  After surgery it may be brought to your room.  For patients admitted to the hospital, discharge time will be determined by your treatment team.  Patients discharged the day of surgery will not be allowed to drive home.   Name and phone number of your driver:   family Special instructions:  None  Please read over the following fact sheets that you were given. Anesthesia Post-op Instructions and Care and Recovery After Surgery       Cystoscopy  Cystoscopy is a procedure that is used to help diagnose and sometimes treat conditions that affect that lower urinary tract. The lower urinary tract includes the bladder and the tube that drains urine from the bladder out of the body (urethra). Cystoscopy is performed with a thin, tube-shaped instrument with a light and camera at the end (cystoscope). The cystoscope may be hard (rigid) or flexible, depending on the goal of the procedure.The cystoscope is inserted through the urethra,  into the bladder. Cystoscopy may be recommended if you have:  Urinary tractinfections that keep coming back (recurring).  Blood in the urine (hematuria).  Loss of bladder control (urinary incontinence) or an overactive bladder.  Unusual cells found in a urine sample.  A blockage in the urethra.  Painful urination.  An abnormality in the bladder found during an intravenous pyelogram (IVP) or CT scan. Cystoscopy may also be done to remove a sample of tissue to be examined under a microscope (biopsy). Tell a health care provider about:  Any allergies you have.  All medicines you are taking, including vitamins, herbs, eye drops, creams, and over-the-counter medicines.  Any problems you or family members have had with anesthetic medicines.  Any blood disorders you have.  Any surgeries you have had.  Any medical conditions you have.  Whether you are pregnant or may be pregnant. What are the risks? Generally, this is a safe procedure. However, problems may occur, including:  Infection.  Bleeding.  Allergic reactions to medicines.  Damage to other structures or organs. What happens before the procedure?  Ask your health care provider about: ? Changing or stopping your regular medicines. This is especially important if you are taking diabetes medicines or blood thinners. ? Taking medicines such as aspirin and ibuprofen. These medicines can thin your blood. Do not take  these medicines before your procedure if your health care provider instructs you not to.  Follow instructions from your health care provider about eating or drinking restrictions.  You may be given antibiotic medicine to help prevent infection.  You may have an exam or testing, such as X-rays of the bladder, urethra, or kidneys.  You may have urine tests to check for signs of infection.  Plan to have someone take you home after the procedure. What happens during the procedure?  To reduce your risk of  infection,your health care team will wash or sanitize their hands.  You will be given one or more of the following: ? A medicine to help you relax (sedative). ? A medicine to numb the area (local anesthetic).  The area around the opening of your urethra will be cleaned.  The cystoscope will be passed through your urethra into your bladder.  Germ-free (sterile)fluid will flow through the cystoscope to fill your bladder. The fluid will stretch your bladder so that your surgeon can clearly examine your bladder walls.  The cystoscope will be removed and your bladder will be emptied. The procedure may vary among health care providers and hospitals. What happens after the procedure?  You may have some soreness or pain in your abdomen and urethra. Medicines will be available to help you.  You may have some blood in your urine.  Do not drive for 24 hours if you received a sedative. This information is not intended to replace advice given to you by your health care provider. Make sure you discuss any questions you have with your health care provider. Document Released: 09/25/2000 Document Revised: 07/09/2017 Document Reviewed: 08/15/2015 Elsevier Interactive Patient Education  2019 [place] Anesthesia, Adult, Care After This sheet gives you information about how to care for yourself after your procedure. Your health care provider may also give you more specific instructions. If you have problems or questions, contact your health care provider. What can I expect after the procedure? After the procedure, the following side effects are common:  Pain or discomfort at the IV site.  Nausea.  Vomiting.  Sore throat.  Trouble concentrating.  Feeling cold or chills.  Weak or tired.  Sleepiness and fatigue.  Soreness and body aches. These side effects can affect parts of the body that were not involved in surgery. Follow these instructions at home:  For at least 24 hours  after the procedure:  Have a responsible adult stay with you. It is important to have someone help care for you until you are awake and alert.  Rest as needed.  Do not: ? Participate in activities in which you could fall or become injured. ? Drive. ? Use heavy machinery. ? Drink alcohol. ? Take sleeping pills or medicines that cause drowsiness. ? Make important decisions or sign legal documents. ? Take care of children on your own. Eating and drinking  Follow any instructions from your health care provider about eating or drinking restrictions.  When you feel hungry, start by eating small amounts of foods that are soft and easy to digest (bland), such as toast. Gradually return to your regular diet.  Drink enough fluid to keep your urine pale yellow.  If you vomit, rehydrate by drinking water, juice, or clear broth. General instructions  If you have sleep apnea, surgery and certain medicines can increase your risk for breathing problems. Follow instructions from your health care provider about wearing your sleep device: ? Anytime you are sleeping, including during  daytime naps. ? While taking prescription pain medicines, sleeping medicines, or medicines that make you drowsy.  Return to your normal activities as told by your health care provider. Ask your health care provider what activities are safe for you.  Take over-the-counter and prescription medicines only as told by your health care provider.  If you smoke, do not smoke without supervision.  Keep all follow-up visits as told by your health care provider. This is important. Contact a health care provider if:  You have nausea or vomiting that does not get better with medicine.  You cannot eat or drink without vomiting.  You have pain that does not get better with medicine.  You are unable to pass urine.  You develop a skin rash.  You have a fever.  You have redness around your IV site that gets worse. Get help  right away if:  You have difficulty breathing.  You have chest pain.  You have blood in your urine or stool, or you vomit blood. Summary  After the procedure, it is common to have a sore throat or nausea. It is also common to feel tired.  Have a responsible adult stay with you for the first 24 hours after general anesthesia. It is important to have someone help care for you until you are awake and alert.  When you feel hungry, start by eating small amounts of foods that are soft and easy to digest (bland), such as toast. Gradually return to your regular diet.  Drink enough fluid to keep your urine pale yellow.  Return to your normal activities as told by your health care provider. Ask your health care provider what activities are safe for you. This information is not intended to replace advice given to you by your health care provider. Make sure you discuss any questions you have with your health care provider. Document Released: 01/04/2001 Document Revised: 05/14/2017 Document Reviewed: 05/14/2017 Elsevier Interactive Patient Education  2019 [employer].

## 2019-03-08 ENCOUNTER — Telehealth: Payer: Self-pay | Admitting: Vascular Surgery

## 2019-03-08 ENCOUNTER — Encounter (HOSPITAL_COMMUNITY): Payer: Self-pay

## 2019-03-08 ENCOUNTER — Encounter (HOSPITAL_COMMUNITY)
Admission: RE | Admit: 2019-03-08 | Discharge: 2019-03-08 | Disposition: A | Discharge status: Home / Self Care | Payer: Medicare Other | Source: Ambulatory Visit | Attending: Urology | Admitting: Urology

## 2019-03-08 NOTE — Telephone Encounter (Signed)
This was only a test. No call was made. CD

## 2019-03-09 ENCOUNTER — Other Ambulatory Visit (HOSPITAL_COMMUNITY): Admission: RE | Admit: 2019-03-09 | Payer: Medicare Other | Source: Ambulatory Visit

## 2019-03-13 ENCOUNTER — Ambulatory Visit: Admit: 2019-03-13 | Payer: Medicare Other | Admitting: Urology

## 2019-03-13 SURGERY — CYSTOSCOPY WITH INSERTION OF UROLIFT
Anesthesia: Choice

## 2019-03-16 ENCOUNTER — Telehealth: Payer: Self-pay | Admitting: Radiology

## 2019-03-16 NOTE — Telephone Encounter (Signed)
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 I advised patient normally we need a signature on file, but with Covid 19 we are trying to limit people coming in and out of the office. He was appreciative, and voiced understanding.

## 2019-03-27 ENCOUNTER — Other Ambulatory Visit: Payer: Self-pay | Admitting: "Endocrinology

## 2019-03-27 ENCOUNTER — Other Ambulatory Visit: Payer: Self-pay | Admitting: Urology

## 2019-04-07 NOTE — Patient Instructions (Signed)
Your procedure is scheduled on: 04/17/2019  Report to [facility] at 6:30    AM.  Call this number if you have problems the morning of surgery: [phone]   Remember:   Do not Eat or Drink after midnight   :  Take these medicines the morning of surgery with A SIP OF WATER: Amiodarone, Protonix and Skelaxin if needed  Take only 1/2 dose of bedtime insulin  (Lantus 20 units)   night before surgery   Do not wear jewelry, make-up or nail polish.  Do not wear lotions, powders, or perfumes. You may wear deodorant.  Do not shave 48 hours prior to surgery. Men may shave face and neck.  Do not bring valuables to the hospital.  Contacts, dentures or bridgework may not be worn into surgery.  Leave suitcase in the car. After surgery it may be brought to your room.  For patients admitted to the hospital, checkout time is 11:00 AM the day of discharge.   Patients discharged the day of surgery will not be allowed to drive home.     Cystoscopy Cystoscopy is a procedure that is used to help diagnose and sometimes treat conditions that affect the lower urinary tract. The lower urinary tract includes the bladder and the urethra. The urethra is the tube that drains urine from the bladder. Cystoscopy is done using a thin, tube-shaped instrument with a light and camera at the end (cystoscope). The cystoscope may be hard or flexible, depending on the goal of the procedure. The cystoscope is inserted through the urethra, into the bladder. Cystoscopy may be recommended if you have:  Urinary tract infections that keep coming back.  Blood in the urine (hematuria).  An inability to control when you urinate (urinary incontinence) or an overactive bladder.  Unusual cells found in a urine sample.  A blockage in the urethra, such as a urinary stone.  Painful urination.  An abnormality in the bladder found during an intravenous pyelogram (IVP) or CT scan. Cystoscopy may also be done to remove a sample of  tissue to be examined under a microscope (biopsy). Tell a health care provider about:  Any allergies you have.  All medicines you are taking, including vitamins, herbs, eye drops, creams, and over-the-counter medicines.  Any problems you or family members have had with anesthetic medicines.  Any blood disorders you have.  Any surgeries you have had.  Any medical conditions you have.  Whether you are pregnant or may be pregnant. What are the risks? Generally, this is a safe procedure. However, problems may occur, including:  Infection.  Bleeding.  Allergic reactions to medicines.  Damage to other structures or organs. What happens before the procedure?  Ask your health care provider about: ? Changing or stopping your regular medicines. This is especially important if you are taking diabetes medicines or blood thinners. ? Taking medicines such as aspirin and ibuprofen. These medicines can thin your blood. Do not take these medicines unless your health care provider tells you to take them. ? Taking over-the-counter medicines, vitamins, herbs, and supplements.  Follow instructions from your health care provider about eating or drinking restrictions.  Ask your health care provider what steps will be taken to help prevent infection. These may include: ? Washing skin with a germ-killing soap. ? Taking antibiotic medicine.  You may have an exam or testing, such as: ? X-rays of the bladder, urethra, or kidneys. ? Urine tests to check for signs of infection.  Plan to have  someone take you home from the hospital or clinic. What happens during the procedure?   You will be given one or more of the following: ? A medicine to help you relax (sedative). ? A medicine to numb the area (local anesthetic).  The area around the opening of your urethra will be cleaned.  The cystoscope will be passed through your urethra into your bladder.  Germ-free (sterile) fluid will flow through  the cystoscope to fill your bladder. The fluid will stretch your bladder so that your health care provider can clearly examine your bladder walls.  Your doctor will look at the urethra and bladder. Your doctor may take a biopsy or remove stones.  The cystoscope will be removed, and your bladder will be emptied. The procedure may vary among health care providers and hospitals. What can I expect after the procedure? After the procedure, it is common to have:  Some soreness or pain in your abdomen and urethra.  Urinary symptoms. These include: ? Mild pain or burning when you urinate. Pain should stop within a few minutes after you urinate. This may last for up to 1 week. ? A small amount of blood in your urine for several days. ? Feeling like you need to urinate but producing only a small amount of urine. Follow these instructions at home: Medicines  Take over-the-counter and prescription medicines only as told by your health care provider.  If you were prescribed an antibiotic medicine, take it as told by your health care provider. Do not stop taking the antibiotic even if you start to feel better. General instructions  Return to your normal activities as told by your health care provider. Ask your health care provider what activities are safe for you.  Do not drive for 24 hours if you were given a sedative during your procedure.  Watch for any blood in your urine. If the amount of blood in your urine increases, call your health care provider.  Follow instructions from your health care provider about eating or drinking restrictions.  If a tissue sample was removed for testing (biopsy) during your procedure, it is up to you to get your test results. Ask your health care provider, or the department that is doing the test, when your results will be ready.  Drink enough fluid to keep your urine pale yellow.  Keep all follow-up visits as told by your health care provider. This is  important. Contact a health care provider if you:  Have pain that gets worse or does not get better with medicine, especially pain when you urinate.  Have trouble urinating.  Have more blood in your urine. Get help right away if you:  Have blood clots in your urine.  Have abdominal pain.  Have a fever or chills.  Are unable to urinate. Summary  Cystoscopy is a procedure that is used to help diagnose and sometimes treat conditions that affect the lower urinary tract.  Cystoscopy is done using a thin, tube-shaped instrument with a light and camera at the end.  After the procedure, it is common to have some soreness or pain in your abdomen and urethra.  Watch for any blood in your urine. If the amount of blood in your urine increases, call your health care provider.  If you were prescribed an antibiotic medicine, take it as told by your health care provider. Do not stop taking the antibiotic even if you start to feel better. This information is not intended to replace advice given  to you by your health care provider. Make sure you discuss any questions you have with your health care provider. Document Released: 09/25/2000 Document Revised: 09/20/2018 Document Reviewed: 09/20/2018 Elsevier Patient Education  2020 [place] Anesthesia, Adult, Care After This sheet gives you information about how to care for yourself after your procedure. Your health care provider may also give you more specific instructions. If you have problems or questions, contact your health care provider. What can I expect after the procedure? After the procedure, the following side effects are common:  Pain or discomfort at the IV site.  Nausea.  Vomiting.  Sore throat.  Trouble concentrating.  Feeling cold or chills.  Weak or tired.  Sleepiness and fatigue.  Soreness and body aches. These side effects can affect parts of the body that were not involved in surgery. Follow these  instructions at home:  For at least 24 hours after the procedure:  Have a responsible adult stay with you. It is important to have someone help care for you until you are awake and alert.  Rest as needed.  Do not: ? Participate in activities in which you could fall or become injured. ? Drive. ? Use heavy machinery. ? Drink alcohol. ? Take sleeping pills or medicines that cause drowsiness. ? Make important decisions or sign legal documents. ? Take care of children on your own. Eating and drinking  Follow any instructions from your health care provider about eating or drinking restrictions.  When you feel hungry, start by eating small amounts of foods that are soft and easy to digest (bland), such as toast. Gradually return to your regular diet.  Drink enough fluid to keep your urine pale yellow.  If you vomit, rehydrate by drinking water, juice, or clear broth. General instructions  If you have sleep apnea, surgery and certain medicines can increase your risk for breathing problems. Follow instructions from your health care provider about wearing your sleep device: ? Anytime you are sleeping, including during daytime naps. ? While taking prescription pain medicines, sleeping medicines, or medicines that make you drowsy.  Return to your normal activities as told by your health care provider. Ask your health care provider what activities are safe for you.  Take over-the-counter and prescription medicines only as told by your health care provider.  If you smoke, do not smoke without supervision.  Keep all follow-up visits as told by your health care provider. This is important. Contact a health care provider if:  You have nausea or vomiting that does not get better with medicine.  You cannot eat or drink without vomiting.  You have pain that does not get better with medicine.  You are unable to pass urine.  You develop a skin rash.  You have a fever.  You have redness  around your IV site that gets worse. Get help right away if:  You have difficulty breathing.  You have chest pain.  You have blood in your urine or stool, or you vomit blood. Summary  After the procedure, it is common to have a sore throat or nausea. It is also common to feel tired.  Have a responsible adult stay with you for the first 24 hours after general anesthesia. It is important to have someone help care for you until you are awake and alert.  When you feel hungry, start by eating small amounts of foods that are soft and easy to digest (bland), such as toast. Gradually return to your regular diet.  Drink enough fluid to keep your urine pale yellow.  Return to your normal activities as told by your health care provider. Ask your health care provider what activities are safe for you. This information is not intended to replace advice given to you by your health care provider. Make sure you discuss any questions you have with your health care provider. Document Released: 01/04/2001 Document Revised: 10/01/2017 Document Reviewed: 05/14/2017 Elsevier Patient Education  2020 [employer].

## 2019-04-13 ENCOUNTER — Other Ambulatory Visit: Payer: Self-pay

## 2019-04-13 ENCOUNTER — Other Ambulatory Visit (HOSPITAL_COMMUNITY)
Admission: RE | Admit: 2019-04-13 | Discharge: 2019-04-13 | Disposition: A | Discharge status: Home / Self Care | Payer: Medicare Other | Source: Ambulatory Visit | Attending: Urology | Admitting: Urology

## 2019-04-13 ENCOUNTER — Encounter (HOSPITAL_COMMUNITY)
Admission: RE | Admit: 2019-04-13 | Discharge: 2019-04-13 | Disposition: A | Discharge status: Home / Self Care | Payer: Medicare Other | Source: Ambulatory Visit | Attending: Urology | Admitting: Urology

## 2019-04-13 DIAGNOSIS — Z1159 Encounter for screening for other viral diseases: Secondary | ICD-10-CM | POA: Insufficient documentation

## 2019-04-13 DIAGNOSIS — Z01812 Encounter for preprocedural laboratory examination: Secondary | ICD-10-CM | POA: Insufficient documentation

## 2019-04-13 LAB — BASIC METABOLIC PANEL
Anion gap: 10 (ref 5–15)
BUN: 21 mg/dL (ref 8–23)
CO2: 23 mmol/L (ref 22–32)
Calcium: 8.5 mg/dL — ABNORMAL LOW (ref 8.9–10.3)
Chloride: 104 mmol/L (ref 98–111)
Creatinine, Ser: 1.1 mg/dL (ref 0.61–1.24)
GFR calc Af Amer: >60 mL/min (ref >60)
GFR calc non Af Amer: >60 mL/min (ref >60)
Glucose, Bld: 184 mg/dL — ABNORMAL HIGH (ref 70–99)
Potassium: 4.2 mmol/L (ref 3.5–5.1)
Sodium: 137 mmol/L (ref 135–145)

## 2019-04-13 LAB — HEMOGLOBIN A1C
Hgb A1c MFr Bld: 7.6 % — ABNORMAL HIGH (ref 4.8–5.6)
Mean Plasma Glucose: 171.42 mg/dL

## 2019-04-13 LAB — GLUCOSE, CAPILLARY: Glucose-Capillary: 180 mg/dL — ABNORMAL HIGH (ref 70–99)

## 2019-04-14 LAB — SARS CORONAVIRUS 2 (TAT 6-24 HRS): SARS Coronavirus 2: NEGATIVE

## 2019-04-17 ENCOUNTER — Ambulatory Visit (HOSPITAL_COMMUNITY): Payer: Medicare Other | Admitting: Anesthesiology

## 2019-04-17 ENCOUNTER — Other Ambulatory Visit: Payer: Self-pay

## 2019-04-17 ENCOUNTER — Ambulatory Visit (HOSPITAL_COMMUNITY)
Admission: RE | Admit: 2019-04-17 | Discharge: 2019-04-17 | Disposition: A | Discharge status: Home / Self Care | Payer: Medicare Other | Attending: Urology | Admitting: Urology

## 2019-04-17 ENCOUNTER — Encounter (HOSPITAL_COMMUNITY): Payer: Self-pay | Admitting: *Deleted

## 2019-04-17 ENCOUNTER — Encounter (HOSPITAL_COMMUNITY)
Admission: RE | Disposition: A | Discharge status: Home / Self Care | Payer: Self-pay | Source: Home / Self Care | Attending: Urology

## 2019-04-17 DIAGNOSIS — N138 Other obstructive and reflux uropathy: Secondary | ICD-10-CM | POA: Insufficient documentation

## 2019-04-17 DIAGNOSIS — Z8673 Personal history of transient ischemic attack (TIA), and cerebral infarction without residual deficits: Secondary | ICD-10-CM | POA: Diagnosis not present

## 2019-04-17 DIAGNOSIS — Z6841 Body Mass Index (BMI) 40.0 and over, adult: Secondary | ICD-10-CM | POA: Diagnosis not present

## 2019-04-17 DIAGNOSIS — F17210 Nicotine dependence, cigarettes, uncomplicated: Secondary | ICD-10-CM | POA: Insufficient documentation

## 2019-04-17 DIAGNOSIS — G4733 Obstructive sleep apnea (adult) (pediatric): Secondary | ICD-10-CM | POA: Insufficient documentation

## 2019-04-17 DIAGNOSIS — E1142 Type 2 diabetes mellitus with diabetic polyneuropathy: Secondary | ICD-10-CM | POA: Diagnosis not present

## 2019-04-17 DIAGNOSIS — I6521 Occlusion and stenosis of right carotid artery: Secondary | ICD-10-CM | POA: Insufficient documentation

## 2019-04-17 DIAGNOSIS — I2510 Atherosclerotic heart disease of native coronary artery without angina pectoris: Secondary | ICD-10-CM | POA: Diagnosis not present

## 2019-04-17 DIAGNOSIS — N401 Enlarged prostate with lower urinary tract symptoms: Secondary | ICD-10-CM | POA: Insufficient documentation

## 2019-04-17 DIAGNOSIS — I10 Essential (primary) hypertension: Secondary | ICD-10-CM | POA: Diagnosis not present

## 2019-04-17 DIAGNOSIS — E1151 Type 2 diabetes mellitus with diabetic peripheral angiopathy without gangrene: Secondary | ICD-10-CM | POA: Insufficient documentation

## 2019-04-17 DIAGNOSIS — I252 Old myocardial infarction: Secondary | ICD-10-CM | POA: Diagnosis not present

## 2019-04-17 DIAGNOSIS — K219 Gastro-esophageal reflux disease without esophagitis: Secondary | ICD-10-CM | POA: Diagnosis not present

## 2019-04-17 DIAGNOSIS — N320 Bladder-neck obstruction: Secondary | ICD-10-CM | POA: Diagnosis not present

## 2019-04-17 HISTORY — PX: CYSTOSCOPY WITH INSERTION OF UROLIFT: SHX6678

## 2019-04-17 LAB — GLUCOSE, CAPILLARY
Glucose-Capillary: 90 mg/dL (ref 70–99)
Glucose-Capillary: 75 mg/dL (ref 70–99)

## 2019-04-17 SURGERY — CYSTOSCOPY WITH INSERTION OF UROLIFT
Anesthesia: General

## 2019-04-17 MED ORDER — LIDOCAINE HCL URETHRAL/MUCOSAL 2 % EX GEL
CUTANEOUS | Status: AC
  Filled 2019-04-17: qty 10

## 2019-04-17 MED ORDER — KETAMINE HCL 50 MG/5ML IJ SOSY
PREFILLED_SYRINGE | INTRAMUSCULAR | Status: AC
  Filled 2019-04-17: qty 5

## 2019-04-17 MED ORDER — PROPOFOL 10 MG/ML IV BOLUS
INTRAVENOUS | Status: AC
  Filled 2019-04-17: qty 20

## 2019-04-17 MED ORDER — MIDAZOLAM HCL 2 MG/2ML IJ SOLN: 0.5000 mg | Freq: Once | INTRAMUSCULAR | Status: DC | PRN

## 2019-04-17 MED ORDER — KETAMINE HCL 10 MG/ML IJ SOLN
INTRAMUSCULAR | Status: DC | PRN
  Administered 2019-04-17: 20 mg via INTRAVENOUS

## 2019-04-17 MED ORDER — LIDOCAINE HCL URETHRAL/MUCOSAL 2 % EX GEL
CUTANEOUS | Status: DC | PRN
  Administered 2019-04-17: 1 via URETHRAL

## 2019-04-17 MED ORDER — PROPOFOL 500 MG/50ML IV EMUL
INTRAVENOUS | Status: DC | PRN
  Administered 2019-04-17: 50 ug/kg/min via INTRAVENOUS

## 2019-04-17 MED ORDER — PROMETHAZINE HCL 25 MG/ML IJ SOLN: 6.2500 mg | INTRAMUSCULAR | Status: DC | PRN

## 2019-04-17 MED ORDER — HYDROMORPHONE HCL 8 MG PO TABS: 8.0000 mg | ORAL_TABLET | ORAL | 0 refills | Status: DC | PRN

## 2019-04-17 MED ORDER — CEFAZOLIN SODIUM-DEXTROSE 2-4 GM/100ML-% IV SOLN
2.0000 g | INTRAVENOUS | Status: AC
  Administered 2019-04-17: 08:00:00 2 g via INTRAVENOUS
  Filled 2019-04-17: qty 100

## 2019-04-17 MED ORDER — HYDROMORPHONE HCL 1 MG/ML IJ SOLN
INTRAMUSCULAR | Status: AC
  Filled 2019-04-17: qty 0.5

## 2019-04-17 MED ORDER — LACTATED RINGERS IV SOLN
INTRAVENOUS | Status: DC
  Administered 2019-04-17: 08:00:00 via INTRAVENOUS

## 2019-04-17 MED ORDER — HYDROMORPHONE HCL 1 MG/ML IJ SOLN
0.5000 mg | Freq: Once | INTRAMUSCULAR | Status: AC
  Administered 2019-04-17: 0.5 mg via INTRAVENOUS

## 2019-04-17 MED ORDER — MIDAZOLAM HCL 2 MG/2ML IJ SOLN
INTRAMUSCULAR | Status: AC
  Filled 2019-04-17: qty 2

## 2019-04-17 MED ORDER — MIDAZOLAM HCL 5 MG/5ML IJ SOLN
INTRAMUSCULAR | Status: DC | PRN
  Administered 2019-04-17: 1 mg via INTRAVENOUS

## 2019-04-17 MED ORDER — STERILE WATER FOR IRRIGATION IR SOLN
Status: DC | PRN
  Administered 2019-04-17: 3000 mL

## 2019-04-17 MED ORDER — STERILE WATER FOR IRRIGATION IR SOLN
Status: DC | PRN
  Administered 2019-04-17: 500 mL

## 2019-04-17 SURGICAL SUPPLY — 19 items
BAG DRAIN URO TABLE W/ADPT NS (BAG) ×3 IMPLANT
BAG DRN 8 ADPR NS SKTRN CSTL (BAG) ×1
CLOTH BEACON ORANGE TIMEOUT ST (SAFETY) ×3 IMPLANT
GLOVE BIO SURGEON STRL SZ8 (GLOVE) ×3 IMPLANT
GLOVE BIOGEL PI IND STRL 7.0 (GLOVE) ×2 IMPLANT
GLOVE BIOGEL PI INDICATOR 7.0 (GLOVE) ×4
GLOVE ECLIPSE 6.5 STRL STRAW (GLOVE) ×2 IMPLANT
GOWN STRL REUS W/TWL LRG LVL3 (GOWN DISPOSABLE) ×3 IMPLANT
GOWN STRL REUS W/TWL XL LVL3 (GOWN DISPOSABLE) ×3 IMPLANT
KIT TURNOVER CYSTO (KITS) ×3 IMPLANT
MANIFOLD NEPTUNE II (INSTRUMENTS) ×3 IMPLANT
PACK CYSTO (CUSTOM PROCEDURE TRAY) ×3 IMPLANT
PAD ARMBOARD 7.5X6 YLW CONV (MISCELLANEOUS) ×3 IMPLANT
SYSTEM UROLIFT (Male Continence) ×8 IMPLANT
TOWEL OR 17X26 4PK STRL BLUE (TOWEL DISPOSABLE) ×3 IMPLANT
TRAY FOLEY W/BAG SLVR 16FR (SET/KITS/TRAYS/PACK) ×3
TRAY FOLEY W/BAG SLVR 16FR ST (SET/KITS/TRAYS/PACK) ×1 IMPLANT
WATER STERILE IRR 3000ML UROMA (IV SOLUTION) ×3 IMPLANT
WATER STERILE IRR 500ML POUR (IV SOLUTION) ×3 IMPLANT
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
## 2019-04-17 NOTE — Anesthesia Preprocedure Evaluation (Signed)
Anesthesia Evaluation  Patient identified by MRN, date of birth, ID band Patient awake    Reviewed: Allergy & Precautions, NPO status , Patient's Chart, lab work & pertinent test results  Airway Mallampati: II       Dental  (+) Edentulous Upper, Edentulous Lower   Pulmonary sleep apnea , Current Smoker,  [place] with CPAP Smoker 1/2 PPD Smoked today           Cardiovascular Exercise Tolerance: Poor hypertension, Pt. on medications + CAD, + Past MI and + Peripheral Vascular Disease  II     Neuro/Psych Uses WC-   Neuromuscular disease CVA, Residual Symptoms negative psych ROS   GI/Hepatic Neg liver ROS, GERD  Medicated and Controlled,  Endo/Other  diabetesMorbid obesity  Renal/GU Renal InsufficiencyRenal disease  negative genitourinary   Musculoskeletal  (+) Arthritis ,   Abdominal   Peds negative pediatric ROS (+)  Hematology negative hematology ROS (+) anemia ,   Anesthesia Other Findings   Reproductive/Obstetrics negative OB ROS                             Anesthesia Physical Anesthesia Plan  ASA: IV  Anesthesia Plan: General   Post-op Pain Management:    Induction: Intravenous  PONV Risk Score and Plan: 0 and Treatment may vary due to age or medical condition  Airway Management Planned: Nasal Cannula and Simple Face Mask  Additional Equipment:   Intra-op Plan:   Post-operative Plan: Extubation in OR  Informed Consent: I have reviewed the patients History and Physical, chart, labs and discussed the procedure including the risks, benefits and alternatives for the proposed anesthesia with the patient or authorized representative who has indicated his/her understanding and acceptance.     Dental advisory given  Plan Discussed with: CRNA  Anesthesia Plan Comments: (Plan Full PPE use  Plan MAC vs GA vs GETA as needed )        Anesthesia Quick Evaluation

## 2019-04-17 NOTE — Anesthesia Postprocedure Evaluation (Signed)
Anesthesia Post Note  Patient: [name]  Procedure(s) Performed: CYSTOSCOPY WITH INSERTION OF UROLIFT (N/A )  Patient location during evaluation: PACU Anesthesia Type: MAC Level of consciousness: awake and alert, lethargic and patient cooperative Pain management: pain level controlled Vital Signs Assessment: post-procedure vital signs reviewed and stable Respiratory status: spontaneous breathing, nonlabored ventilation, respiratory function stable and patient connected to nasal cannula oxygen Postop Assessment: no apparent nausea or vomiting Anesthetic complications: no     Last Vitals:  Vitals:   04/17/19 0718 04/17/19 0748  BP: (!) 85/42 (!) 93/48  Pulse: (!) 51 (!) 49  Resp: 11 18  Temp: 36.7 C   SpO2: 98% 100%    Last Pain:  Vitals:   04/17/19 0718  TempSrc: Oral  PainSc: 0-No pain                 Kadeja Granada

## 2019-04-17 NOTE — Discharge Instructions (Signed)
Indwelling Urinary Catheter Care, Adult °An indwelling urinary catheter is a thin tube that is put into your bladder. The tube helps to drain pee (urine) out of your body. The tube goes in through your urethra. Your urethra is where pee comes out of your body. Your pee will come out through the catheter, then it will go into a bag (drainage bag). °Take good care of your catheter so it will work well. °How to wear your catheter and bag °Supplies needed °· Sticky tape (adhesive tape) or a leg strap. °· Alcohol wipe or soap and water (if you use tape). °· A clean towel (if you use tape). °· Large overnight bag. °· Smaller bag (leg bag). °Wearing your catheter °Attach your catheter to your leg with tape or a leg strap. °· Make sure the catheter is not pulled tight. °· If a leg strap gets wet, take it off and put on a dry strap. °· If you use tape to hold the bag on your leg: °1. Use an alcohol wipe or soap and water to wash your skin where the tape made it sticky before. °2. Use a clean towel to pat-dry that skin. °3. Use new tape to make the bag stay on your leg. °Wearing your bags °You should have been given a large overnight bag. °· You may wear the overnight bag in the day or night. °· Always have the overnight bag lower than your bladder.  Do not let the bag touch the floor. °· Before you go to sleep, put a clean plastic bag in a wastebasket. Then hang the overnight bag inside the wastebasket. °You should also have a smaller leg bag that fits under your clothes. °· Always wear the leg bag below your knee. °· Do not wear your leg bag at night. °How to care for your skin and catheter °Supplies needed °· A clean washcloth. °· Water and mild soap. °· A clean towel. °Caring for your skin and catheter ° °  ° °· Clean the skin around your catheter every day: °? Wash your hands with soap and water. °? Wet a clean washcloth in warm water and mild soap. °? Clean the skin around your urethra. °? If you are male: °? Gently  spread the folds of skin around your vagina (labia). °? With the washcloth in your other hand, wipe the inner side of your labia on each side. Wipe from front to back. °? If you are male: °? Pull back any skin that covers the end of your penis (foreskin). °? With the washcloth in your other hand, wipe your penis in small circles. Start wiping at the tip of your penis, then move away from the catheter. °? Move the foreskin back in place, if needed. °? With your free hand, hold the catheter close to where it goes into your body. °? Keep holding the catheter during cleaning so it does not get pulled out. °? With the washcloth in your other hand, clean the catheter. °? Only wipe downward on the catheter. °? Do not wipe upward toward your body. Doing this may push germs into your urethra and cause infection. °? Use a clean towel to pat-dry the catheter and the skin around it. Make sure to wipe off all soap. °? Wash your hands with soap and water. °· Shower every day. Do not take baths. °· Do not use cream, ointment, or lotion on the area where the catheter goes into your body, unless your doctor tells you   to. °· Do not use powders, sprays, or lotions on your genital area. °· Check your skin around the catheter every day for signs of infection. Check for: °? Redness, swelling, or pain. °? Fluid or blood. °? Warmth. °? Pus or a bad smell. °How to empty the bag °Supplies needed °· Rubbing alcohol. °· Gauze pad or cotton ball. °· Tape or a leg strap. °Emptying the bag °Pour the pee out of your bag when it is ?-½ full, or at least 2-3 times a day. Do this for your overnight bag and your leg bag. °1. Wash your hands with soap and water. °2. Separate (detach) the bag from your leg. °3. Hold the bag over the toilet or a clean pail. Keep the bag lower than your hips and bladder. This is so the pee (urine) does not go back into the tube. °4. Open the pour spout. It is at the bottom of the bag. °5. Empty the pee into the toilet or  pail. Do not let the pour spout touch any surface. °6. Put rubbing alcohol on a gauze pad or cotton ball. °7. Use the gauze pad or cotton ball to clean the pour spout. °8. Close the pour spout. °9. Attach the bag to your leg with tape or a leg strap. °10. Wash your hands with soap and water. °Follow instructions for cleaning the drainage bag: °· From the product maker. °· As told by your doctor. °How to change the bag °Supplies needed °· Alcohol wipes. °· A clean bag. °· Tape or a leg strap. °Changing the bag °Replace your bag when it starts to leak, smell bad, or look dirty. °1. Wash your hands with soap and water. °2. Separate the dirty bag from your leg. °3. Pinch the catheter with your fingers so that pee does not spill out. °4. Separate the catheter tube from the bag tube where these tubes connect (at the connection valve). Do not let the tubes touch any surface. °5. Clean the end of the catheter tube with an alcohol wipe. Use a different alcohol wipe to clean the end of the bag tube. °6. Connect the catheter tube to the tube of the clean bag. °7. Attach the clean bag to your leg with tape or a leg strap. Do not make the bag tight on your leg. °8. Wash your hands with soap and water. °General rules ° °· Never pull on your catheter. Never try to take it out. Doing that can hurt you. °· Always wash your hands before and after you touch your catheter or bag. Use a mild, fragrance-free soap. If you do not have soap and water, use hand sanitizer. °· Always make sure there are no twists or bends (kinks) in the catheter tube. °· Always make sure there are no leaks in the catheter or bag. °· Drink enough fluid to keep your pee pale yellow. °· Do not take baths, swim, or use a hot tub. °· If you are male, wipe from front to back after you poop (have a bowel movement). °Contact a doctor if: °· Your pee is cloudy. °· Your pee smells worse than usual. °· Your catheter gets clogged. °· Your catheter leaks. °· Your bladder  feels full. °Get help right away if: °· You have redness, swelling, or pain where the catheter goes into your body. °· You have fluid, blood, pus, or a bad smell coming from the area where the catheter goes into your body. °· Your skin feels warm where   the catheter goes into your body.  You have a fever.  You have pain in your: ? Belly (abdomen). ? Legs. ? Lower back. ? Bladder.  You see blood in the catheter.  Your pee is pink or red.  You feel sick to your stomach (nauseous).  You throw up (vomit).  You have chills.  Your pee is not draining into the bag.  Your catheter gets pulled out. Summary  An indwelling urinary catheter is a thin tube that is placed into the bladder to help drain pee (urine) out of the body.  The catheter is placed into the part of the body that drains pee from the bladder (urethra).  Taking good care of your catheter will keep it working properly and help prevent problems.  Always wash your hands before and after touching your catheter or bag.  Never pull on your catheter or try to take it out. This information is not intended to replace advice given to you by your health care provider. Make sure you discuss any questions you have with your health care provider. Document Released: 01/23/2013 Document Revised: 01/20/2019 Document Reviewed: 05/14/2017 Elsevier Patient Education  [place].      Incision and Drainage, Care After This sheet gives you information about how to care for yourself after your procedure. Your health care provider may also give you more specific instructions. If you have problems or questions, contact your health care provider. What can I expect after the procedure? After the procedure, it is common to have:  Pain or discomfort around the incision site.  Blood, fluid, or pus (drainage) from the incision.  Redness and firm skin around the incision site. Follow these instructions at home: Medicines  Take  over-the-counter and prescription medicines only as told by your health care provider.  If you were prescribed an antibiotic medicine, use or take it as told by your health care provider. Do not stop using the antibiotic even if you start to feel better. Wound care Follow instructions from your health care provider about how to take care of your wound. Make sure you:  Wash your hands with soap and water before and after you change your bandage (dressing). If soap and water are not available, use hand sanitizer.  Change your dressing and packing as told by your health care provider. ? If your dressing is dry or stuck when you try to remove it, moisten or wet the dressing with saline or water so that it can be removed without harming your skin or tissues. ? If your wound is packed, leave it in place until your health care provider tells you to remove it. To remove the packing, moisten or wet the packing with saline or water so that it can be removed without harming your skin or tissues.  Leave stitches (sutures), skin glue, or adhesive strips in place. These skin closures may need to stay in place for 2 weeks or longer. If adhesive strip edges start to loosen and curl up, you may trim the loose edges. Do not remove adhesive strips completely unless your health care provider tells you to do that. Check your wound every day for signs of infection. Check for:  More redness, swelling, or pain.  More fluid or blood.  Warmth.  Pus or a bad smell. If you were sent home with a drain tube in place, follow instructions from your health care provider about:  How to empty it.  How to care for it at home.  General instructions  Rest the affected area.  Do not take baths, swim, or use a hot tub until your health care provider approves. Ask your health care provider if you may take showers. You may only be allowed to take sponge baths.  Return to your normal activities as told by your health care  provider. Ask your health care provider what activities are safe for you. Your health care provider may put you on activity or lifting restrictions.  The incision will continue to drain. It is normal to have some clear or slightly bloody drainage. The amount of drainage should lessen each day.  Do not apply any creams, ointments, or liquids unless you have been told to by your health care provider.  Keep all follow-up visits as told by your health care provider. This is important. Contact a health care provider if:  Your cyst or abscess returns.  You have a fever or chills.  You have more redness, swelling, or pain around your incision.  You have more fluid or blood coming from your incision.  Your incision feels warm to the touch.  You have pus or a bad smell coming from your incision.  You have red streaks above or below the incision site. Get help right away if:  You have severe pain or bleeding.  You cannot eat or drink without vomiting.  You have decreased urine output.  You become short of breath.  You have chest pain.  You cough up blood.  The affected area becomes numb or starts to tingle. These symptoms may represent a serious problem that is an emergency. Do not wait to see if the symptoms will go away. Get medical help right away. Call your local emergency services (911 in the [place] Do not drive yourself to the hospital. Summary  After this procedure, it is common to have fluid, blood, or pus coming from the surgery site.  Follow all home care instructions. You will be told how to take care of your incision, how to check for infection, and how to take medicines.  If you were prescribed an antibiotic medicine, take it as told by your health care provider. Do not stop taking the antibiotic even if you start to feel better.  Contact a health care provider if you have increased redness, swelling, or pain around your incision. Get help right away if you have chest  pain, you vomit, you cough up blood, or you have shortness of breath.  Keep all follow-up visits as told by your health care provider. This is important. This information is not intended to replace advice given to you by your health care provider. Make sure you discuss any questions you have with your health care provider. Document Released: 12/21/2011 Document Revised: 08/29/2018 Document Reviewed: 08/29/2018 Elsevier Patient Education  2020 [employer].

## 2019-04-17 NOTE — Transfer of Care (Signed)
Immediate Anesthesia Transfer of Care Note  Patient: [name]  Procedure(s) Performed: CYSTOSCOPY WITH INSERTION OF UROLIFT (N/A )  Patient Location: PACU  Anesthesia Type:MAC  Level of Consciousness: awake and alert drowsy  Airway & Oxygen Therapy: Patient Spontanous Breathing and Patient connected to nasal cannula oxygen  Post-op Assessment: Report given to RN and Post -op Vital signs reviewed and stable  Post vital signs: Reviewed and stable  Last Vitals:  Vitals Value Taken Time  BP    Temp    Pulse 53 04/17/19 0836  Resp 11 04/17/19 0836  SpO2 98 % 04/17/19 0836  Vitals shown include unvalidated device data.  Last Pain:  Vitals:   04/17/19 0718  TempSrc: Oral  PainSc: 0-No pain         Complications: No apparent anesthesia complications

## 2019-04-17 NOTE — Op Note (Signed)
   PREOPERATIVE DIAGNOSIS: Benign prostatic hypertrophy with bladder outlet obstruction.  POSTOPERATIVE DIAGNOSIS: Benign prostatic hypertrophy with bladder outlet obstruction.  PROCEDURE: Cystoscopy with implantation of UroLift devices, 4 implants.  SURGEON: Nicolette Bang, M.D.  ANESTHESIA: MAC  ANTIBIOTICS: ancef  SPECIMEN: None.  DRAINS: A 16-French Foley catheter.  BLOOD LOSS: Minimal.  COMPLICATIONS: None.  INDICATIONS:The Patient is an 68 year old male with BPH and bladder outlet obstruction. He has failed medical therapy and has elected UroLift for definitive treatment.  FINDINGS OF PROCEDURE: He was taken to the operating room where a genral anesthetic was induced. He was placed in lithotomy position and was fitted with PAS hose. His perineum and genitalia were prepped with chlorhexidine, and he was draped in usual sterile fashion.  Cystoscopy was performed using the UroLift scope and 0 degree lens. Examination revealed a normal urethra. The external sphincter was intact. Prostatic urethra was approximately 4 cm in length with lateral lobe enlargement. There was also little bit of bladder neck elevation. Inspection of bladder revealed mild-to-moderate trabeculation with no tumors, stones, or inflammation. No cellules or diverticula were noted. Ureteral orifices were in their normal anatomic position effluxing clear urine.  After initial cystoscopy, the visual obturator was replaced with the first UroLift device. This was turned to the 9 o'clock position and pulled back to the veru and then slightly advanced. Pressure was then applied to the right lateral lobe and the UroLift device was deployed.  The second UroLift device was then inserted and applied to the left lateral lobe at 3 o'clock and deployed in the mid prostatic urethra. After this, there was still some apparent obstruction closer to the bladder neck. So a second level of  UroLift your left device was applied between the mid urethra and the proximal urethra providing further patency to the prostatic urethra. At this point, there was mild bleeding but the patient did have a spinal anesthetic. So it was thought that a Foley catheter was indicated. The scope was removed and a 16-French Foley catheter was inserted without difficulty. The balloon was filled with 10 mL sterile fluid, and the catheter was placed to straight drainage.  COMPLICATIONS: None   CONDITION: Stable, extubated, transferred to PACU  PLAN: The patient will be discharged home and followup in 2 days for a voiding trial.

## 2019-04-18 ENCOUNTER — Encounter (HOSPITAL_COMMUNITY): Payer: Self-pay | Admitting: Urology

## 2019-04-19 ENCOUNTER — Other Ambulatory Visit: Payer: Self-pay

## 2019-04-19 ENCOUNTER — Ambulatory Visit: Payer: Medicare Other | Admitting: Urology

## 2019-04-25 ENCOUNTER — Other Ambulatory Visit: Payer: Self-pay | Admitting: Cardiology

## 2019-04-26 ENCOUNTER — Ambulatory Visit (INDEPENDENT_AMBULATORY_CARE_PROVIDER_SITE_OTHER): Payer: Medicare Other | Admitting: Urology

## 2019-04-26 DIAGNOSIS — N401 Enlarged prostate with lower urinary tract symptoms: Secondary | ICD-10-CM | POA: Diagnosis not present

## 2019-04-27 ENCOUNTER — Other Ambulatory Visit: Payer: Self-pay

## 2019-04-27 ENCOUNTER — Ambulatory Visit (INDEPENDENT_AMBULATORY_CARE_PROVIDER_SITE_OTHER): Payer: Medicare Other | Admitting: "Endocrinology

## 2019-04-27 ENCOUNTER — Encounter: Payer: Self-pay | Admitting: "Endocrinology

## 2019-04-27 VITALS — BP 106/67 | HR 70 | Temp 97.1°F | Ht 66.0 in | Wt 248.0 lb

## 2019-04-27 DIAGNOSIS — E1159 Type 2 diabetes mellitus with other circulatory complications: Secondary | ICD-10-CM

## 2019-04-27 DIAGNOSIS — I10 Essential (primary) hypertension: Secondary | ICD-10-CM | POA: Diagnosis not present

## 2019-04-27 DIAGNOSIS — E782 Mixed hyperlipidemia: Secondary | ICD-10-CM
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
## 2019-05-10 ENCOUNTER — Ambulatory Visit (INDEPENDENT_AMBULATORY_CARE_PROVIDER_SITE_OTHER): Payer: Medicare Other | Admitting: Urology

## 2019-05-10 DIAGNOSIS — R3915 Urgency of urination: Secondary | ICD-10-CM | POA: Diagnosis not present

## 2019-05-10 DIAGNOSIS — N401 Enlarged prostate with lower urinary tract symptoms: Secondary | ICD-10-CM | POA: Diagnosis not present

## 2019-05-30 ENCOUNTER — Ambulatory Visit: Payer: Medicare Other | Admitting: "Endocrinology

## 2019-06-16 ENCOUNTER — Other Ambulatory Visit: Payer: Self-pay | Admitting: *Deleted

## 2019-06-16 DIAGNOSIS — Z20822 Contact with and (suspected) exposure to covid-19: Secondary | ICD-10-CM

## 2019-06-17 LAB — NOVEL CORONAVIRUS, NAA: SARS-CoV-2, NAA: NOT DETECTED

## 2019-06-21 ENCOUNTER — Ambulatory Visit: Payer: Medicare Other | Admitting: Urology

## 2019-07-11 ENCOUNTER — Telehealth (INDEPENDENT_AMBULATORY_CARE_PROVIDER_SITE_OTHER): Payer: Self-pay | Admitting: Nurse Practitioner

## 2019-07-11 NOTE — Telephone Encounter (Signed)
[name], pls call patient and let him know I refilled his Pantoprazole rx, he needs office visit for further refills as his last ov was 05/2017. thx

## 2019-07-31 ENCOUNTER — Ambulatory Visit: Payer: Medicare Other | Admitting: Neurology

## 2019-08-02 ENCOUNTER — Ambulatory Visit: Payer: Medicare Other | Admitting: Neurology

## 2019-08-03 ENCOUNTER — Ambulatory Visit (INDEPENDENT_AMBULATORY_CARE_PROVIDER_SITE_OTHER): Payer: Medicare Other | Admitting: Family

## 2019-08-03 ENCOUNTER — Other Ambulatory Visit: Payer: Self-pay

## 2019-08-03 ENCOUNTER — Encounter: Payer: Self-pay | Admitting: Family

## 2019-08-03 VITALS — BP 108/70 | HR 59 | Temp 96.8°F | Resp 20 | Ht 68.0 in | Wt 270.0 lb

## 2019-08-03 DIAGNOSIS — I739 Peripheral vascular disease, unspecified: Secondary | ICD-10-CM | POA: Diagnosis not present

## 2019-08-03 DIAGNOSIS — L97522 Non-pressure chronic ulcer of other part of left foot with fat layer exposed: Secondary | ICD-10-CM

## 2019-08-03 DIAGNOSIS — I6523 Occlusion and stenosis of bilateral carotid arteries: Secondary | ICD-10-CM

## 2019-08-03 DIAGNOSIS — Z95828 Presence of other vascular implants and grafts: Secondary | ICD-10-CM

## 2019-08-03 DIAGNOSIS — E1151 Type 2 diabetes mellitus with diabetic peripheral angiopathy without gangrene: Secondary | ICD-10-CM

## 2019-08-03 DIAGNOSIS — F17200 Nicotine dependence, unspecified, uncomplicated: Secondary | ICD-10-CM

## 2019-08-03 NOTE — Patient Instructions (Addendum)
Steps to Quit Smoking Smoking tobacco is the leading cause of preventable death. It can affect almost every organ in the body. Smoking puts you and people around you at risk for many serious, long-lasting (chronic) diseases. Quitting smoking can be hard, but it is one of the best things that you can do for your health. It is never too late to quit. How do I get ready to quit? When you decide to quit smoking, make a plan to help you succeed. Before you quit:  Pick a date to quit. Set a date within the next 2 weeks to give you time to prepare.  Write down the reasons why you are quitting. Keep this list in places where you will see it often.  Tell your family, friends, and co-workers that you are quitting. Their support is important.  Talk with your doctor about the choices that may help you quit.  Find out if your health insurance will pay for these treatments.  Know the people, places, things, and activities that make you want to smoke (triggers). Avoid them. What first steps can I take to quit smoking?  Throw away all cigarettes at home, at work, and in your car.  Throw away the things that you use when you smoke, such as ashtrays and lighters.  Clean your car. Make sure to empty the ashtray.  Clean your home, including curtains and carpets. What can I do to help me quit smoking? Talk with your doctor about taking medicines and seeing a counselor at the same time. You are more likely to succeed when you do both.  If you are pregnant or breastfeeding, talk with your doctor about counseling or other ways to quit smoking. Do not take medicine to help you quit smoking unless your doctor tells you to do so. To quit smoking: Quit right away  Quit smoking totally, instead of slowly cutting back on how much you smoke over a period of time.  Go to counseling. You are more likely to quit if you go to counseling sessions regularly. Take medicine You may take medicines to help you quit. Some  medicines need a prescription, and some you can buy over-the-counter. Some medicines may contain a drug called nicotine to replace the nicotine in cigarettes. Medicines may:  Help you to stop having the desire to smoke (cravings).  Help to stop the problems that come when you stop smoking (withdrawal symptoms). Your doctor may ask you to use:  Nicotine patches, gum, or lozenges.  Nicotine inhalers or sprays.  Non-nicotine medicine that is taken by mouth. Find resources Find resources and other ways to help you quit smoking and remain smoke-free after you quit. These resources are most helpful when you use them often. They include:  Online chats with a counselor.  Phone quitlines.  Printed self-help materials.  Support groups or group counseling.  Text messaging programs.  Mobile phone apps. Use apps on your mobile phone or tablet that can help you stick to your quit plan. There are many free apps for mobile phones and tablets as well as websites. Examples include Quit Guide from the CDC and smokefree.gov  What things can I do to make it easier to quit?   Talk to your family and friends. Ask them to support and encourage you.  Call a phone quitline (1-800-QUIT-NOW), reach out to support groups, or work with a counselor.  Ask people who smoke to not smoke around you.  Avoid places that make you want to smoke,   such as: ? Bars. ? Parties. ? Smoke-break areas at work.  Spend time with people who do not smoke.  Lower the stress in your life. Stress can make you want to smoke. Try these things to help your stress: ? Getting regular exercise. ? Doing deep-breathing exercises. ? Doing yoga. ? Meditating. ? Doing a body scan. To do this, close your eyes, focus on one area of your body at a time from head to toe. Notice which parts of your body are tense. Try to relax the muscles in those areas. How will I feel when I quit smoking? Day 1 to 3 weeks Within the first 24 hours,  you may start to have some problems that come from quitting tobacco. These problems are very bad 2-3 days after you quit, but they do not often last for more than 2-3 weeks. You may get these symptoms:  Mood swings.  Feeling restless, nervous, angry, or annoyed.  Trouble concentrating.  Dizziness.  Strong desire for high-sugar foods and nicotine.  Weight gain.  Trouble pooping (constipation).  Feeling like you may vomit (nausea).  Coughing or a sore throat.  Changes in how the medicines that you take for other issues work in your body.  Depression.  Trouble sleeping (insomnia). Week 3 and afterward After the first 2-3 weeks of quitting, you may start to notice more positive results, such as:  Better sense of smell and taste.  Less coughing and sore throat.  Slower heart rate.  Lower blood pressure.  Clearer skin.  Better breathing.  Fewer sick days. Quitting smoking can be hard. Do not give up if you fail the first time. Some people need to try a few times before they succeed. Do your best to stick to your quit plan, and talk with your doctor if you have any questions or concerns. Summary  Smoking tobacco is the leading cause of preventable death. Quitting smoking can be hard, but it is one of the best things that you can do for your health.  When you decide to quit smoking, make a plan to help you succeed.  Quit smoking right away, not slowly over a period of time.  When you start quitting, seek help from your doctor, family, or friends. This information is not intended to replace advice given to you by your health care provider. Make sure you discuss any questions you have with your health care provider. Document Released: 07/25/2009 Document Revised: 12/16/2018 Document Reviewed: 12/17/2018 Elsevier Patient Education  2020 [employer]     Stroke Prevention Some medical conditions and lifestyle choices can lead to a higher risk for a stroke. You can  help to prevent a stroke by making nutrition, lifestyle, and other changes. What nutrition changes can be made?   Eat healthy foods. ? Choose foods that are high in fiber. These include:  Fresh fruits.  Fresh vegetables.  Whole grains. ? Eat at least 5 or more servings of fruits and vegetables each day. Try to fill half of your plate at each meal with fruits and vegetables. ? Choose lean protein foods. These include:  Lowfat (lean) cuts of meat.  Chicken without skin.  Fish.  Tofu.  Beans.  Nuts. ? Eat low-fat dairy products. ? Avoid foods that:  Are high in salt (sodium).  Have saturated fat.  Have trans fat.  Have cholesterol.  Are processed.  Are premade.  Follow eating guidelines as told by your doctor. These may include: ? Reducing how many calories you   eat and drink each day. ? Limiting how much salt you eat or drink each day to 1,500 milligrams (mg). ? Using only healthy fats for cooking. These include:  Olive oil.  Canola oil.  Sunflower oil. ? Counting how many carbohydrates you eat and drink each day. What lifestyle changes can be made?  Try to stay at a healthy weight. Talk to your doctor about what a good weight is for you.  Get at least 30 minutes of moderate physical activity at least 5 days a week. This can include: ? Fast walking. ? Biking. ? Swimming.  Do not use any products that have nicotine or tobacco. This includes cigarettes and e-cigarettes. If you need help quitting, ask your doctor. Avoid being around tobacco smoke in general.  Limit how much alcohol you drink to no more than 1 drink a day for nonpregnant women and 2 drinks a day for men. One drink equals 12 oz of beer, 5 oz of wine, or 1 oz of hard liquor.  Do not use drugs.  Avoid taking birth control pills. Talk to your doctor about the risks of taking birth control pills if: ? You are over 35 years old. ? You smoke. ? You get migraines. ? You have had a blood clot.  What other changes can be made?  Manage your cholesterol. ? It is important to eat a healthy diet. ? If your cholesterol cannot be managed through your diet, you may also need to take medicines. Take medicines as told by your doctor.  Manage your diabetes. ? It is important to eat a healthy diet and to exercise regularly. ? If your blood sugar cannot be managed through diet and exercise, you may need to take medicines. Take medicines as told by your doctor.  Control your high blood pressure (hypertension). ? Try to keep your blood pressure below 130/80. This can help lower your risk of stroke. ? It is important to eat a healthy diet and to exercise regularly. ? If your blood pressure cannot be managed through diet and exercise, you may need to take medicines. Take medicines as told by your doctor. ? Ask your doctor if you should check your blood pressure at home. ? Have your blood pressure checked every year. Do this even if your blood pressure is normal.  Talk to your doctor about getting checked for a sleep disorder. Signs of this can include: ? Snoring a lot. ? Feeling very tired.  Take over-the-counter and prescription medicines only as told by your doctor. These may include aspirin or blood thinners (antiplatelets or anticoagulants).  Make sure that any other medical conditions you have are managed. Where to find more information  American Stroke Association: www.strokeassociation.org  National Stroke Association: www.stroke.org Get help right away if:  You have any symptoms of stroke. "BE FAST" is an easy way to remember the main warning signs: ? B - Balance. Signs are dizziness, sudden trouble walking, or loss of balance. ? E - Eyes. Signs are trouble seeing or a sudden change in how you see. ? F - Face. Signs are sudden weakness or loss of feeling of the face, or the face or eyelid drooping on one side. ? A - Arms. Signs are weakness or loss of feeling in an arm. This  happens suddenly and usually on one side of the body. ? S - Speech. Signs are sudden trouble speaking, slurred speech, or trouble understanding what people say. ? T - Time. Time to call emergency   services. Write down what time symptoms started.  You have other signs of stroke, such as: ? A sudden, very bad headache with no known cause. ? Feeling sick to your stomach (nausea). ? Throwing up (vomiting). ? Jerky movements you cannot control (seizure). These symptoms may represent a serious problem that is an emergency. Do not wait to see if the symptoms will go away. Get medical help right away. Call your local emergency services (911 in the [place] Do not drive yourself to the hospital. Summary  You can prevent a stroke by eating healthy, exercising, not smoking, drinking less alcohol, and treating other health problems, such as diabetes, high blood pressure, or high cholesterol.  Do not use any products that contain nicotine or tobacco, such as cigarettes and e-cigarettes.  Get help right away if you have any signs or symptoms of a stroke. This information is not intended to replace advice given to you by your health care provider. Make sure you discuss any questions you have with your health care provider. Document Released: 03/29/2012 Document Revised: 11/24/2018 Document Reviewed: 12/30/2016 Elsevier Patient Education  2020 [employer]     Peripheral Vascular Disease  Peripheral vascular disease (PVD) is a disease of the blood vessels that are not part of your heart and brain. A simple term for PVD is poor circulation. In most cases, PVD narrows the blood vessels that carry blood from your heart to the rest of your body. This can reduce the supply of blood to your arms, legs, and internal organs, like your stomach or kidneys. However, PVD most often affects a person's lower legs and feet. Without treatment, PVD tends to get worse. PVD can also lead to acute ischemic limb. This is when  an arm or leg suddenly cannot get enough blood. This is a medical emergency. Follow these instructions at home: Lifestyle  Do not use any products that contain nicotine or tobacco, such as cigarettes and e-cigarettes. If you need help quitting, ask your doctor.  Lose weight if you are overweight. Or, stay at a healthy weight as told by your doctor.  Eat a diet that is low in fat and cholesterol. If you need help, ask your doctor.  Exercise regularly. Ask your doctor for activities that are right for you. General instructions  Take over-the-counter and prescription medicines only as told by your doctor.  Take good care of your feet: ? Wear comfortable shoes that fit well. ? Check your feet often for any cuts or sores.  Keep all follow-up visits as told by your doctor This is important. Contact a doctor if:  You have cramps in your legs when you walk.  You have leg pain when you are at rest.  You have coldness in a leg or foot.  Your skin changes.  You are unable to get or have an erection (erectile dysfunction).  You have cuts or sores on your feet that do not heal. Get help right away if:  Your arm or leg turns cold, numb, and blue.  Your arms or legs become red, warm, swollen, painful, or numb.  You have chest pain.  You have trouble breathing.  You suddenly have weakness in your face, arm, or leg.  You become very confused or you cannot speak.  You suddenly have a very bad headache.  You suddenly cannot see. Summary  Peripheral vascular disease (PVD) is a disease of the blood vessels.  A simple term for PVD is poor circulation. Without treatment, PVD tends   to get worse.  Treatment may include exercise, low fat and low cholesterol diet, and quitting smoking. This information is not intended to replace advice given to you by your health care provider. Make sure you discuss any questions you have with your health care provider. Document Released: 12/23/2009  Document Revised: 09/10/2017 Document Reviewed: 11/05/2016 Elsevier Patient Education  2020 [employer]  
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
## 2019-08-13 ENCOUNTER — Other Ambulatory Visit: Payer: Self-pay | Admitting: "Endocrinology

## 2019-08-13 ENCOUNTER — Other Ambulatory Visit: Payer: Self-pay | Admitting: Cardiology

## 2019-08-15 ENCOUNTER — Ambulatory Visit: Payer: Medicare Other | Admitting: Neurology

## 2019-08-20 ENCOUNTER — Other Ambulatory Visit: Payer: Self-pay | Admitting: "Endocrinology

## 2019-08-30 ENCOUNTER — Ambulatory Visit: Payer: Medicare Other | Admitting: "Endocrinology

## 2019-08-30 ENCOUNTER — Telehealth: Payer: Self-pay | Admitting: "Endocrinology

## 2019-08-30 NOTE — Telephone Encounter (Signed)
Pt's wife called and said that his PMD is going to follow him with his diabetes so he will not be back

## 2019-08-30 NOTE — Telephone Encounter (Signed)
Ok. No problem.

## 2019-08-31 ENCOUNTER — Ambulatory Visit (INDEPENDENT_AMBULATORY_CARE_PROVIDER_SITE_OTHER): Payer: Medicare Other | Admitting: Nurse Practitioner

## 2019-09-14 ENCOUNTER — Other Ambulatory Visit: Payer: Self-pay | Admitting: Vascular Surgery

## 2019-09-14 ENCOUNTER — Other Ambulatory Visit: Payer: Self-pay | Admitting: Cardiology

## 2019-09-14 ENCOUNTER — Other Ambulatory Visit (INDEPENDENT_AMBULATORY_CARE_PROVIDER_SITE_OTHER): Payer: Self-pay | Admitting: Internal Medicine

## 2019-09-14 DIAGNOSIS — Z95828 Presence of other vascular implants and grafts: Secondary | ICD-10-CM

## 2019-09-21 ENCOUNTER — Ambulatory Visit: Payer: Medicare Other | Admitting: Neurology

## 2019-10-07 ENCOUNTER — Other Ambulatory Visit: Payer: Self-pay | Admitting: "Endocrinology

## 2019-10-12 ENCOUNTER — Other Ambulatory Visit: Payer: Self-pay | Admitting: Cardiology

## 2019-10-12 NOTE — Telephone Encounter (Signed)
Rx request sent to pharmacy.  

## 2019-11-05 ENCOUNTER — Other Ambulatory Visit: Payer: Self-pay | Admitting: Cardiology

## 2019-11-20 ENCOUNTER — Ambulatory Visit (INDEPENDENT_AMBULATORY_CARE_PROVIDER_SITE_OTHER): Payer: Medicare Other | Admitting: Gastroenterology

## 2019-12-15 ENCOUNTER — Other Ambulatory Visit (INDEPENDENT_AMBULATORY_CARE_PROVIDER_SITE_OTHER): Payer: Self-pay | Admitting: Internal Medicine

## 2019-12-15 NOTE — Telephone Encounter (Signed)
Patient will ned to have a OV prior to amy more refills. He was last seen in the office 2018 and a procedure in 2018. He may also get from his PCP.

## 2019-12-23 ENCOUNTER — Other Ambulatory Visit: Payer: Self-pay | Admitting: "Endocrinology

## 2019-12-25 ENCOUNTER — Other Ambulatory Visit: Payer: Self-pay

## 2019-12-25 DIAGNOSIS — Z95828 Presence of other vascular implants and grafts: Secondary | ICD-10-CM

## 2019-12-25 MED ORDER — CLOPIDOGREL BISULFATE 75 MG PO TABS: 75.0000 mg | ORAL_TABLET | Freq: Every day | ORAL | 3 refills | Status: DC

## 2020-01-05 ENCOUNTER — Other Ambulatory Visit: Payer: Self-pay | Admitting: Cardiology

## 2020-01-05 NOTE — Telephone Encounter (Signed)
  *  STAT* If patient is at the pharmacy, call can be transferred to refill team.   1. Which medications need to be refilled? (please list name of each medication and dose if known) amiodarone (PACERONE) 200 MG tablet  2. Which pharmacy/location (including street and city if local pharmacy) is medication to be sent to? [place], [place]  3. Do they need a 30 day or 90 day supply? 90 days    Pt has appt to see Dr. Stanford Breed on 03/12/20

## 2020-01-06 ENCOUNTER — Other Ambulatory Visit: Payer: Self-pay | Admitting: Cardiology

## 2020-01-09 MED ORDER — AMIODARONE HCL 200 MG PO TABS: 200.0000 mg | ORAL_TABLET | Freq: Every day | ORAL | 0 refills | Status: DC
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
## 2020-03-12 ENCOUNTER — Ambulatory Visit: Payer: Medicare HMO | Admitting: Cardiology

## 2020-03-12 ENCOUNTER — Other Ambulatory Visit (INDEPENDENT_AMBULATORY_CARE_PROVIDER_SITE_OTHER): Payer: Self-pay | Admitting: Internal Medicine

## 2020-03-25 ENCOUNTER — Ambulatory Visit (INDEPENDENT_AMBULATORY_CARE_PROVIDER_SITE_OTHER): Payer: Medicare HMO | Admitting: Gastroenterology

## 2020-03-25 ENCOUNTER — Other Ambulatory Visit: Payer: Self-pay

## 2020-03-25 ENCOUNTER — Encounter (INDEPENDENT_AMBULATORY_CARE_PROVIDER_SITE_OTHER): Payer: Self-pay | Admitting: Gastroenterology

## 2020-03-25 VITALS — BP 101/71 | HR 108 | Ht 67.0 in | Wt 248.6 lb

## 2020-03-25 DIAGNOSIS — Z86010 Personal history of colonic polyps: Secondary | ICD-10-CM | POA: Diagnosis not present

## 2020-03-25 DIAGNOSIS — K5909 Other constipation: Secondary | ICD-10-CM

## 2020-03-25 DIAGNOSIS — K219 Gastro-esophageal reflux disease without esophagitis: Secondary | ICD-10-CM | POA: Diagnosis not present

## 2020-03-25 MED ORDER — PANTOPRAZOLE SODIUM 40 MG PO TBEC: 40.0000 mg | DELAYED_RELEASE_TABLET | Freq: Every day | ORAL | 3 refills | Status: AC

## 2020-03-25 MED ORDER — MOVANTIK 25 MG PO TABS: 25.0000 mg | ORAL_TABLET | Freq: Every morning | ORAL | 11 refills | Status: DC

## 2020-03-25 MED ORDER — MOVANTIK 25 MG PO TABS: 25.0000 mg | ORAL_TABLET | Freq: Every morning | ORAL | 3 refills | Status: DC

## 2020-03-25 NOTE — Patient Instructions (Addendum)
We are sending protonix and movantik refills to pharmacy. Please contact me if you would like to schedule a colonoscopy
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
## 2020-04-10 ENCOUNTER — Other Ambulatory Visit: Payer: Self-pay | Admitting: Cardiology
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
## 2020-05-31 ENCOUNTER — Encounter: Payer: Self-pay | Admitting: Cardiology

## 2020-05-31 ENCOUNTER — Other Ambulatory Visit: Payer: Self-pay

## 2020-05-31 ENCOUNTER — Other Ambulatory Visit: Payer: Self-pay | Admitting: *Deleted

## 2020-05-31 ENCOUNTER — Ambulatory Visit (INDEPENDENT_AMBULATORY_CARE_PROVIDER_SITE_OTHER): Payer: Medicare Other | Admitting: Cardiology

## 2020-05-31 VITALS — BP 112/68 | HR 103 | Ht 67.0 in | Wt 251.8 lb

## 2020-05-31 DIAGNOSIS — I10 Essential (primary) hypertension: Secondary | ICD-10-CM | POA: Diagnosis not present

## 2020-05-31 DIAGNOSIS — I2510 Atherosclerotic heart disease of native coronary artery without angina pectoris: Secondary | ICD-10-CM | POA: Diagnosis not present

## 2020-05-31 DIAGNOSIS — I255 Ischemic cardiomyopathy: Secondary | ICD-10-CM

## 2020-05-31 DIAGNOSIS — E7800 Pure hypercholesterolemia, unspecified: Secondary | ICD-10-CM | POA: Diagnosis not present

## 2020-05-31 DIAGNOSIS — I4892 Unspecified atrial flutter: Secondary | ICD-10-CM

## 2020-05-31 LAB — CBC
Hematocrit: 40.1 % (ref 37.5–51.0)
Hemoglobin: 13.3 g/dL (ref 13.0–17.7)
MCH: 30.4 pg (ref 26.6–33.0)
MCHC: 33.2 g/dL (ref 31.5–35.7)
MCV: 92 fL (ref 79–97)
Platelets: 199 10*3/uL (ref 150–450)
RBC: 4.37 x10E6/uL (ref 4.14–5.80)
RDW: 13 % (ref 11.6–15.4)
WBC: 12.3 10*3/uL — ABNORMAL HIGH (ref 3.4–10.8)

## 2020-05-31 LAB — COMPREHENSIVE METABOLIC PANEL
ALT: 11 IU/L (ref 0–44)
AST: 15 IU/L (ref 0–40)
Albumin/Globulin Ratio: 1.3 (ref 1.2–2.2)
Albumin: 3.9 g/dL (ref 3.8–4.8)
Alkaline Phosphatase: 90 IU/L (ref 48–121)
BUN/Creatinine Ratio: 12 (ref 10–24)
BUN: 12 mg/dL (ref 8–27)
Bilirubin Total: 0.3 mg/dL (ref 0.0–1.2)
CO2: 24 mmol/L (ref 20–29)
Calcium: 8.8 mg/dL (ref 8.6–10.2)
Chloride: 102 mmol/L (ref 96–106)
Creatinine, Ser: 1.01 mg/dL (ref 0.76–1.27)
GFR calc Af Amer: 88 mL/min/{1.73_m2} (ref >59)
GFR calc non Af Amer: 76 mL/min/{1.73_m2} (ref >59)
Globulin, Total: 2.9 g/dL (ref 1.5–4.5)
Glucose: 150 mg/dL — ABNORMAL HIGH (ref 65–99)
Potassium: 4.2 mmol/L (ref 3.5–5.2)
Sodium: 139 mmol/L (ref 134–144)
Total Protein: 6.8 g/dL (ref 6.0–8.5)

## 2020-05-31 LAB — LIPID PANEL
Chol/HDL Ratio: 3 ratio (ref 0.0–5.0)
Cholesterol, Total: 110 mg/dL (ref 100–199)
HDL: 37 mg/dL — ABNORMAL LOW (ref >39)
LDL Chol Calc (NIH): 47 mg/dL (ref 0–99)
Triglycerides: 151 mg/dL — ABNORMAL HIGH (ref 0–149)
VLDL Cholesterol Cal: 26 mg/dL (ref 5–40)

## 2020-05-31 LAB — TSH: TSH: 3.14 u[IU]/mL (ref 0.450–4.500)

## 2020-05-31 MED ORDER — APIXABAN 5 MG PO TABS: 5.0000 mg | ORAL_TABLET | Freq: Two times a day (BID) | ORAL | 6 refills | Status: AC

## 2020-05-31 NOTE — Patient Instructions (Addendum)
Medication Instructions:   STOP PLAVIX  START ELIQUIS 5 MG ONE TABLET TWICE DAILY  *If you need a refill on your cardiac medications before your next appointment, please call your pharmacy*   Lab Work: Your physician recommends that you HAVE LAB WORK TODAY  If you have labs (blood work) drawn today and your tests are completely normal, you will receive your results only by: Marland Kitchen MyChart Message (if you have MyChart) OR . A paper copy in the mail If you have any lab test that is abnormal or we need to change your treatment, we will call you to review the results.   Testing/Procedures:  Your physician has requested that you have an echocardiogram. Echocardiography is a painless test that uses sound waves to create images of your heart. It provides your doctor with information about the size and shape of your heart and how well your heart's chambers and valves are working. This procedure takes approximately one hour. There are no restrictions for this procedure.[place]   A chest x-ray takes a picture of the organs and structures inside the chest, including the heart, lungs, and blood vessels. This test can show several things, including, whether the heart is enlarges; whether fluid is building up in the lungs; and whether pacemaker / defibrillator leads are still in place. [place]: At [facility], you and your health needs are our priority.  As part of our continuing mission to provide you with exceptional heart care, we have created designated Provider Care Teams.  These Care Teams include your primary Cardiologist (physician) and Advanced Practice Providers (APPs -  Physician Assistants and Nurse Practitioners) who all work together to provide you with the care you need, when you need it.  We recommend signing up for the patient portal called "MyChart".  Sign up information is provided on this After Visit Summary.  MyChart is used to  connect with patients for Virtual Visits (Telemedicine).  Patients are able to view lab/test results, encounter notes, upcoming appointments, etc.  Non-urgent messages can be sent to your provider as well.   To learn more about what you can do with MyChart, go to [email or web address].    Your next appointment:   3 month(s)  The format for your next appointment:   In Person  Provider:   Kirk Ruths, MD   Other Instructions  GO TO [place] Friday 07-05-20 @ 1:30 PM FOR COVID TESTING   You are scheduled for a Cardioversion on 07-08-20 with Dr. Johnsie Cancel.  Please arrive at the [facility] (Main Entrance A) at [facility]: [address] [place], [place] [address] at 11 AM. (1 hour prior to procedure unless lab work is needed; if lab work is needed arrive 1.5 hours ahead)  DIET: Nothing to eat or drink after midnight except a sip of water with medications (see medication instructions below)  Medication Instructions:   Continue your anticoagulant: EKIQUIS   DO NOT TAKE METFORMIN [place] must have a responsible person to drive you home and stay in the waiting area during your procedure. Failure to do so could result in cancellation.  Bring your insurance cards.  *Special Note: Every effort is made to have your procedure done on time. Occasionally there are emergencies that occur at the hospital that may cause delays. Please be patient if a delay does occur.

## 2020-06-03 ENCOUNTER — Encounter: Payer: Self-pay | Admitting: *Deleted

## 2020-06-03 ENCOUNTER — Telehealth: Payer: Self-pay | Admitting: Cardiology

## 2020-06-03 NOTE — Telephone Encounter (Signed)
Per pt did not tolerate Eliquis had bad H/A "felt like eyes were popping out of socket "and weak Per pt stopped Eliquis did not take this am and feels better Pt is wanting to cancel DCCV and reschedule this to the beginnng of the year. Will forward to Dr Stanford Breed for review and recommendations ./cy

## 2020-06-03 NOTE — Telephone Encounter (Signed)
Spoke with pt wife, she reports the patient was scared after taking the eliquis and will not take anything else for now. She reports the patient wants to get his botox injections next month. They want to cancel all procedures. The patient will follow up in November as scheduled. DCCV cancelled.

## 2020-06-03 NOTE — Telephone Encounter (Signed)
DC apixaban.  Xarelto 20 mg daily.  Would like to proceed with cardioversion once he has been on Xarelto for 4 consecutive weeks if patient agreeable. Kirk Ruths

## 2020-06-03 NOTE — Telephone Encounter (Signed)
Pt c/o medication issue:  1. Name of Medication: apixaban (ELIQUIS) 5 MG TABS tablet  2. How are you currently taking this medication (dosage and times per day)? As not taken since 06/02/20  3. Are you having a reaction (difficulty breathing--STAT)? Yes   4. What is your medication issue? [name] is calling stating [name] started taking Eliquis Friday. Saturday he began experiencing a headache, was hardly able to stand, and she states he felt his "eyes were gonna blow out of socket". [name] has never had headaches like this since he had his last stroke. He stopped taking the medication due to this. [name] asked all upcoming procedures be canceled due to this and they will reschedule for the beginning of the year and try again then. Please advise.

## 2020-06-25 ENCOUNTER — Other Ambulatory Visit: Payer: Self-pay | Admitting: "Endocrinology

## 2020-07-01 ENCOUNTER — Other Ambulatory Visit (HOSPITAL_COMMUNITY): Payer: Medicare Other

## 2020-07-05 ENCOUNTER — Other Ambulatory Visit (HOSPITAL_COMMUNITY): Payer: Medicare Other

## 2020-07-08 ENCOUNTER — Encounter (HOSPITAL_COMMUNITY): Payer: Self-pay

## 2020-07-08 ENCOUNTER — Ambulatory Visit (HOSPITAL_COMMUNITY): Admit: 2020-07-08 | Payer: Medicare Other | Admitting: Cardiovascular Disease

## 2020-07-08 SURGERY — CARDIOVERSION
Anesthesia: General

## 2020-08-02 ENCOUNTER — Other Ambulatory Visit: Payer: Self-pay | Admitting: *Deleted

## 2020-08-02 MED ORDER — AMIODARONE HCL 200 MG PO TABS: 200.0000 mg | ORAL_TABLET | Freq: Every day | ORAL | 2 refills | Status: AC

## 2020-08-02 NOTE — Telephone Encounter (Signed)
Rx has been sent to the pharmacy electronically. ° °
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
## 2020-08-23 ENCOUNTER — Ambulatory Visit: Payer: Medicare Other | Admitting: Cardiology

## 2020-09-23 ENCOUNTER — Telehealth (HOSPITAL_COMMUNITY): Payer: Self-pay | Admitting: Cardiology

## 2020-09-23 NOTE — Telephone Encounter (Signed)
I reached out to schedule patients echocardiogram per his request in August and patient has declined to schedule at this time. Order will be removed from the [facility] and if he decides to have in the future we can reinstate the order or create a new order.

## 2020-09-23 NOTE — Telephone Encounter (Signed)
Forward to Dr Stanford Breed  and Hilda Blades RN

## 2020-10-25 ENCOUNTER — Encounter (HOSPITAL_COMMUNITY): Payer: Self-pay

## 2020-10-25 ENCOUNTER — Emergency Department (HOSPITAL_COMMUNITY): Payer: Medicare Other

## 2020-10-25 ENCOUNTER — Inpatient Hospital Stay (HOSPITAL_COMMUNITY)
Admission: EM | Admit: 2020-10-25 | Discharge: 2020-11-29 | DRG: 853 | Disposition: A | Discharge status: Skilled Nursing Facility | Payer: Medicare Other | Attending: Internal Medicine | Admitting: Internal Medicine

## 2020-10-25 ENCOUNTER — Other Ambulatory Visit: Payer: Self-pay

## 2020-10-25 DIAGNOSIS — Z951 Presence of aortocoronary bypass graft: Secondary | ICD-10-CM | POA: Diagnosis not present

## 2020-10-25 DIAGNOSIS — M86172 Other acute osteomyelitis, left ankle and foot: Secondary | ICD-10-CM | POA: Diagnosis present

## 2020-10-25 DIAGNOSIS — U071 COVID-19: Secondary | ICD-10-CM | POA: Diagnosis not present

## 2020-10-25 DIAGNOSIS — I429 Cardiomyopathy, unspecified: Secondary | ICD-10-CM

## 2020-10-25 DIAGNOSIS — I130 Hypertensive heart and chronic kidney disease with heart failure and stage 1 through stage 4 chronic kidney disease, or unspecified chronic kidney disease: Secondary | ICD-10-CM | POA: Diagnosis present

## 2020-10-25 DIAGNOSIS — R1310 Dysphagia, unspecified: Secondary | ICD-10-CM | POA: Diagnosis present

## 2020-10-25 DIAGNOSIS — E1122 Type 2 diabetes mellitus with diabetic chronic kidney disease: Secondary | ICD-10-CM | POA: Diagnosis present

## 2020-10-25 DIAGNOSIS — E11621 Type 2 diabetes mellitus with foot ulcer: Secondary | ICD-10-CM | POA: Diagnosis not present

## 2020-10-25 DIAGNOSIS — F0150 Vascular dementia without behavioral disturbance: Secondary | ICD-10-CM | POA: Diagnosis not present

## 2020-10-25 DIAGNOSIS — I5043 Acute on chronic combined systolic (congestive) and diastolic (congestive) heart failure: Secondary | ICD-10-CM | POA: Diagnosis not present

## 2020-10-25 DIAGNOSIS — D72829 Elevated white blood cell count, unspecified: Secondary | ICD-10-CM

## 2020-10-25 DIAGNOSIS — E1142 Type 2 diabetes mellitus with diabetic polyneuropathy: Secondary | ICD-10-CM | POA: Diagnosis present

## 2020-10-25 DIAGNOSIS — R509 Fever, unspecified: Secondary | ICD-10-CM

## 2020-10-25 DIAGNOSIS — I4892 Unspecified atrial flutter: Secondary | ICD-10-CM | POA: Diagnosis present

## 2020-10-25 DIAGNOSIS — M7989 Other specified soft tissue disorders: Secondary | ICD-10-CM | POA: Diagnosis not present

## 2020-10-25 DIAGNOSIS — K7460 Unspecified cirrhosis of liver: Secondary | ICD-10-CM | POA: Diagnosis present

## 2020-10-25 DIAGNOSIS — I739 Peripheral vascular disease, unspecified: Secondary | ICD-10-CM

## 2020-10-25 DIAGNOSIS — G8929 Other chronic pain: Secondary | ICD-10-CM | POA: Diagnosis present

## 2020-10-25 DIAGNOSIS — Z885 Allergy status to narcotic agent status: Secondary | ICD-10-CM

## 2020-10-25 DIAGNOSIS — G4733 Obstructive sleep apnea (adult) (pediatric): Secondary | ICD-10-CM | POA: Diagnosis present

## 2020-10-25 DIAGNOSIS — Z794 Long term (current) use of insulin: Secondary | ICD-10-CM

## 2020-10-25 DIAGNOSIS — F17210 Nicotine dependence, cigarettes, uncomplicated: Secondary | ICD-10-CM | POA: Diagnosis present

## 2020-10-25 DIAGNOSIS — Z8673 Personal history of transient ischemic attack (TIA), and cerebral infarction without residual deficits: Secondary | ICD-10-CM

## 2020-10-25 DIAGNOSIS — M84478A Pathological fracture, left toe(s), initial encounter for fracture: Secondary | ICD-10-CM | POA: Diagnosis present

## 2020-10-25 DIAGNOSIS — Z888 Allergy status to other drugs, medicaments and biological substances status: Secondary | ICD-10-CM

## 2020-10-25 DIAGNOSIS — I2510 Atherosclerotic heart disease of native coronary artery without angina pectoris: Secondary | ICD-10-CM | POA: Diagnosis present

## 2020-10-25 DIAGNOSIS — G928 Other toxic encephalopathy: Secondary | ICD-10-CM | POA: Diagnosis not present

## 2020-10-25 DIAGNOSIS — L97429 Non-pressure chronic ulcer of left heel and midfoot with unspecified severity: Secondary | ICD-10-CM | POA: Diagnosis present

## 2020-10-25 DIAGNOSIS — Z7189 Other specified counseling: Secondary | ICD-10-CM

## 2020-10-25 DIAGNOSIS — Z515 Encounter for palliative care: Secondary | ICD-10-CM | POA: Diagnosis not present

## 2020-10-25 DIAGNOSIS — R6520 Severe sepsis without septic shock: Secondary | ICD-10-CM | POA: Diagnosis present

## 2020-10-25 DIAGNOSIS — L03116 Cellulitis of left lower limb: Secondary | ICD-10-CM | POA: Diagnosis present

## 2020-10-25 DIAGNOSIS — G9341 Metabolic encephalopathy: Secondary | ICD-10-CM | POA: Diagnosis not present

## 2020-10-25 DIAGNOSIS — R1115 Cyclical vomiting syndrome unrelated to migraine: Secondary | ICD-10-CM

## 2020-10-25 DIAGNOSIS — E782 Mixed hyperlipidemia: Secondary | ICD-10-CM | POA: Diagnosis present

## 2020-10-25 DIAGNOSIS — M869 Osteomyelitis, unspecified: Secondary | ICD-10-CM

## 2020-10-25 DIAGNOSIS — I69354 Hemiplegia and hemiparesis following cerebral infarction affecting left non-dominant side: Secondary | ICD-10-CM | POA: Diagnosis not present

## 2020-10-25 DIAGNOSIS — E1165 Type 2 diabetes mellitus with hyperglycemia: Secondary | ICD-10-CM | POA: Diagnosis present

## 2020-10-25 DIAGNOSIS — N182 Chronic kidney disease, stage 2 (mild): Secondary | ICD-10-CM | POA: Diagnosis present

## 2020-10-25 DIAGNOSIS — Z87820 Personal history of traumatic brain injury: Secondary | ICD-10-CM

## 2020-10-25 DIAGNOSIS — R6510 Systemic inflammatory response syndrome (SIRS) of non-infectious origin without acute organ dysfunction: Secondary | ICD-10-CM | POA: Diagnosis not present

## 2020-10-25 DIAGNOSIS — R627 Adult failure to thrive: Secondary | ICD-10-CM | POA: Diagnosis present

## 2020-10-25 DIAGNOSIS — K219 Gastro-esophageal reflux disease without esophagitis: Secondary | ICD-10-CM | POA: Diagnosis present

## 2020-10-25 DIAGNOSIS — Z79899 Other long term (current) drug therapy: Secondary | ICD-10-CM

## 2020-10-25 DIAGNOSIS — L97529 Non-pressure chronic ulcer of other part of left foot with unspecified severity: Secondary | ICD-10-CM | POA: Diagnosis present

## 2020-10-25 DIAGNOSIS — L259 Unspecified contact dermatitis, unspecified cause: Secondary | ICD-10-CM | POA: Diagnosis not present

## 2020-10-25 DIAGNOSIS — Z7901 Long term (current) use of anticoagulants: Secondary | ICD-10-CM

## 2020-10-25 DIAGNOSIS — Z7984 Long term (current) use of oral hypoglycemic drugs: Secondary | ICD-10-CM

## 2020-10-25 DIAGNOSIS — R0602 Shortness of breath: Secondary | ICD-10-CM

## 2020-10-25 DIAGNOSIS — Z83438 Family history of other disorder of lipoprotein metabolism and other lipidemia: Secondary | ICD-10-CM

## 2020-10-25 DIAGNOSIS — N390 Urinary tract infection, site not specified: Secondary | ICD-10-CM | POA: Diagnosis present

## 2020-10-25 DIAGNOSIS — D62 Acute posthemorrhagic anemia: Secondary | ICD-10-CM | POA: Diagnosis not present

## 2020-10-25 DIAGNOSIS — E1152 Type 2 diabetes mellitus with diabetic peripheral angiopathy with gangrene: Secondary | ICD-10-CM | POA: Diagnosis present

## 2020-10-25 DIAGNOSIS — E8809 Other disorders of plasma-protein metabolism, not elsewhere classified: Secondary | ICD-10-CM | POA: Diagnosis present

## 2020-10-25 DIAGNOSIS — Z66 Do not resuscitate: Secondary | ICD-10-CM

## 2020-10-25 DIAGNOSIS — I10 Essential (primary) hypertension: Secondary | ICD-10-CM | POA: Diagnosis not present

## 2020-10-25 DIAGNOSIS — E440 Moderate protein-calorie malnutrition: Secondary | ICD-10-CM | POA: Diagnosis present

## 2020-10-25 DIAGNOSIS — E876 Hypokalemia: Secondary | ICD-10-CM | POA: Diagnosis present

## 2020-10-25 DIAGNOSIS — I255 Ischemic cardiomyopathy: Secondary | ICD-10-CM | POA: Diagnosis present

## 2020-10-25 DIAGNOSIS — Z789 Other specified health status: Secondary | ICD-10-CM | POA: Diagnosis not present

## 2020-10-25 DIAGNOSIS — A419 Sepsis, unspecified organism: Secondary | ICD-10-CM | POA: Diagnosis present

## 2020-10-25 DIAGNOSIS — Z833 Family history of diabetes mellitus: Secondary | ICD-10-CM

## 2020-10-25 DIAGNOSIS — R0600 Dyspnea, unspecified: Secondary | ICD-10-CM | POA: Diagnosis not present

## 2020-10-25 DIAGNOSIS — I340 Nonrheumatic mitral (valve) insufficiency: Secondary | ICD-10-CM | POA: Diagnosis not present

## 2020-10-25 DIAGNOSIS — F0391 Unspecified dementia with behavioral disturbance: Secondary | ICD-10-CM | POA: Diagnosis present

## 2020-10-25 DIAGNOSIS — I483 Typical atrial flutter: Secondary | ICD-10-CM | POA: Diagnosis not present

## 2020-10-25 DIAGNOSIS — I82611 Acute embolism and thrombosis of superficial veins of right upper extremity: Secondary | ICD-10-CM | POA: Diagnosis present

## 2020-10-25 DIAGNOSIS — N400 Enlarged prostate without lower urinary tract symptoms: Secondary | ICD-10-CM | POA: Diagnosis present

## 2020-10-25 DIAGNOSIS — R159 Full incontinence of feces: Secondary | ICD-10-CM | POA: Diagnosis not present

## 2020-10-25 DIAGNOSIS — J9601 Acute respiratory failure with hypoxia: Secondary | ICD-10-CM | POA: Diagnosis not present

## 2020-10-25 DIAGNOSIS — Z8249 Family history of ischemic heart disease and other diseases of the circulatory system: Secondary | ICD-10-CM

## 2020-10-25 DIAGNOSIS — I484 Atypical atrial flutter: Secondary | ICD-10-CM | POA: Diagnosis not present

## 2020-10-25 DIAGNOSIS — Z993 Dependence on wheelchair: Secondary | ICD-10-CM

## 2020-10-25 DIAGNOSIS — S92912A Unspecified fracture of left toe(s), initial encounter for closed fracture: Secondary | ICD-10-CM

## 2020-10-25 DIAGNOSIS — I252 Old myocardial infarction: Secondary | ICD-10-CM

## 2020-10-25 DIAGNOSIS — I70262 Atherosclerosis of native arteries of extremities with gangrene, left leg: Secondary | ICD-10-CM | POA: Diagnosis present

## 2020-10-25 DIAGNOSIS — I480 Paroxysmal atrial fibrillation: Secondary | ICD-10-CM | POA: Diagnosis present

## 2020-10-25 DIAGNOSIS — I5023 Acute on chronic systolic (congestive) heart failure: Secondary | ICD-10-CM | POA: Diagnosis present

## 2020-10-25 DIAGNOSIS — Z886 Allergy status to analgesic agent status: Secondary | ICD-10-CM

## 2020-10-25 DIAGNOSIS — E1169 Type 2 diabetes mellitus with other specified complication: Secondary | ICD-10-CM | POA: Diagnosis present

## 2020-10-25 DIAGNOSIS — I4891 Unspecified atrial fibrillation: Secondary | ICD-10-CM | POA: Diagnosis not present

## 2020-10-25 DIAGNOSIS — I493 Ventricular premature depolarization: Secondary | ICD-10-CM | POA: Diagnosis present

## 2020-10-25 DIAGNOSIS — Z6838 Body mass index (BMI) 38.0-38.9, adult: Secondary | ICD-10-CM

## 2020-10-25 LAB — COMPREHENSIVE METABOLIC PANEL
ALT: 20 U/L (ref 0–44)
AST: 23 U/L (ref 15–41)
Albumin: 2.7 g/dL — ABNORMAL LOW (ref 3.5–5.0)
Alkaline Phosphatase: 107 U/L (ref 38–126)
Anion gap: 11 (ref 5–15)
BUN: 14 mg/dL (ref 8–23)
CO2: 26 mmol/L (ref 22–32)
Calcium: 8.4 mg/dL — ABNORMAL LOW (ref 8.9–10.3)
Chloride: 103 mmol/L (ref 98–111)
Creatinine, Ser: 0.96 mg/dL (ref 0.61–1.24)
GFR, Estimated: >60 mL/min (ref >60)
Glucose, Bld: 161 mg/dL — ABNORMAL HIGH (ref 70–99)
Potassium: 2.9 mmol/L — ABNORMAL LOW (ref 3.5–5.1)
Sodium: 140 mmol/L (ref 135–145)
Total Bilirubin: 0.7 mg/dL (ref 0.3–1.2)
Total Protein: 7.3 g/dL (ref 6.5–8.1)

## 2020-10-25 LAB — CBC WITH DIFFERENTIAL/PLATELET
Abs Immature Granulocytes: 0.16 10*3/uL — ABNORMAL HIGH (ref 0.00–0.07)
Basophils Absolute: 0.1 10*3/uL (ref 0.0–0.1)
Basophils Relative: 1 %
Eosinophils Absolute: 0.1 10*3/uL (ref 0.0–0.5)
Eosinophils Relative: 1 %
HCT: 35 % — ABNORMAL LOW (ref 39.0–52.0)
Hemoglobin: 11.3 g/dL — ABNORMAL LOW (ref 13.0–17.0)
Immature Granulocytes: 1 %
Lymphocytes Relative: 11 %
Lymphs Abs: 1.7 10*3/uL (ref 0.7–4.0)
MCH: 30.8 pg (ref 26.0–34.0)
MCHC: 32.3 g/dL (ref 30.0–36.0)
MCV: 95.4 fL (ref 80.0–100.0)
Monocytes Absolute: 1 10*3/uL (ref 0.1–1.0)
Monocytes Relative: 7 %
Neutro Abs: 12.2 10*3/uL — ABNORMAL HIGH (ref 1.7–7.7)
Neutrophils Relative %: 79 %
Platelets: 286 10*3/uL (ref 150–400)
RBC: 3.67 MIL/uL — ABNORMAL LOW (ref 4.22–5.81)
RDW: 15.6 % — ABNORMAL HIGH (ref 11.5–15.5)
WBC: 15.2 10*3/uL — ABNORMAL HIGH (ref 4.0–10.5)
nRBC: 0 % (ref 0.0–0.2)

## 2020-10-25 LAB — URINALYSIS, ROUTINE W REFLEX MICROSCOPIC
Bilirubin Urine: NEGATIVE
Glucose, UA: NEGATIVE mg/dL
Ketones, ur: 5 mg/dL — AB
Nitrite: POSITIVE — AB
Protein, ur: 30 mg/dL — AB
Specific Gravity, Urine: 1.019 (ref 1.005–1.030)
pH: 5 (ref 5.0–8.0)

## 2020-10-25 LAB — CBG MONITORING, ED: Glucose-Capillary: 141 mg/dL — ABNORMAL HIGH (ref 70–99)

## 2020-10-25 LAB — APTT: aPTT: 33 seconds (ref 24–36)

## 2020-10-25 LAB — RESP PANEL BY RT-PCR (FLU A&B, COVID) ARPGX2
Influenza A by PCR: NEGATIVE
Influenza B by PCR: NEGATIVE
SARS Coronavirus 2 by RT PCR: NEGATIVE

## 2020-10-25 LAB — PROTIME-INR
INR: 1.2 (ref 0.8–1.2)
Prothrombin Time: 14.3 seconds (ref 11.4–15.2)

## 2020-10-25 LAB — LACTIC ACID, PLASMA: Lactic Acid, Venous: 1.5 mmol/L (ref 0.5–1.9)

## 2020-10-25 MED ORDER — VANCOMYCIN HCL IN DEXTROSE 1-5 GM/200ML-% IV SOLN
1000.0000 mg | Freq: Once | INTRAVENOUS | Status: DC
  Filled 2020-10-25: qty 200

## 2020-10-25 MED ORDER — PANTOPRAZOLE SODIUM 40 MG PO TBEC
40.0000 mg | DELAYED_RELEASE_TABLET | Freq: Every day | ORAL | Status: DC
  Administered 2020-10-26 – 2020-11-29 (×32): 40 mg via ORAL
  Filled 2020-10-25 (×32): qty 1

## 2020-10-25 MED ORDER — ENOXAPARIN SODIUM 60 MG/0.6ML ~~LOC~~ SOLN
60.0000 mg | SUBCUTANEOUS | Status: DC
  Administered 2020-10-26: 60 mg via SUBCUTANEOUS
  Filled 2020-10-25: qty 0.6

## 2020-10-25 MED ORDER — POTASSIUM CHLORIDE 10 MEQ/100ML IV SOLN
10.0000 meq | INTRAVENOUS | Status: AC
Start: 2020-10-25 — End: 2020-10-26
  Administered 2020-10-25 (×2): 10 meq via INTRAVENOUS
  Filled 2020-10-25 (×2): qty 100

## 2020-10-25 MED ORDER — AMIODARONE HCL 200 MG PO TABS
200.0000 mg | ORAL_TABLET | Freq: Every day | ORAL | Status: DC
Start: 2020-10-26 — End: 2020-10-26

## 2020-10-25 MED ORDER — FINASTERIDE 5 MG PO TABS
5.0000 mg | ORAL_TABLET | Freq: Every day | ORAL | Status: DC
  Administered 2020-10-26 – 2020-11-29 (×31): 5 mg via ORAL
  Filled 2020-10-25 (×33): qty 1

## 2020-10-25 MED ORDER — LISINOPRIL 5 MG PO TABS
2.5000 mg | ORAL_TABLET | Freq: Every day | ORAL | Status: DC
  Administered 2020-10-26 – 2020-10-27 (×2): 2.5 mg via ORAL
  Filled 2020-10-25 (×2): qty 1

## 2020-10-25 MED ORDER — ACETAMINOPHEN 325 MG PO TABS
650.0000 mg | ORAL_TABLET | Freq: Four times a day (QID) | ORAL | Status: DC | PRN
  Administered 2020-10-25: 650 mg via ORAL
  Filled 2020-10-25: qty 2

## 2020-10-25 MED ORDER — INSULIN ASPART 100 UNIT/ML ~~LOC~~ SOLN
0.0000 [IU] | Freq: Every day | SUBCUTANEOUS | Status: DC
  Administered 2020-10-26 – 2020-10-28 (×2): 2 [IU] via SUBCUTANEOUS
  Administered 2020-10-31: 23:00:00 3 [IU] via SUBCUTANEOUS
  Administered 2020-11-01: 23:00:00 2 [IU] via SUBCUTANEOUS
  Administered 2020-11-02: 23:00:00 4 [IU] via SUBCUTANEOUS
  Administered 2020-11-03: 22:00:00 2 [IU] via SUBCUTANEOUS
  Administered 2020-11-05 – 2020-11-06 (×2): 3 [IU] via SUBCUTANEOUS
  Administered 2020-11-07 – 2020-11-09 (×3): 2 [IU] via SUBCUTANEOUS
  Administered 2020-11-10 – 2020-11-12 (×2): 3 [IU] via SUBCUTANEOUS
  Administered 2020-11-13 – 2020-11-14 (×2): 2 [IU] via SUBCUTANEOUS
  Administered 2020-11-16: 3 [IU] via SUBCUTANEOUS
  Administered 2020-11-21: 2 [IU] via SUBCUTANEOUS
  Administered 2020-11-23: 4 [IU] via SUBCUTANEOUS
  Administered 2020-11-24: 2 [IU] via SUBCUTANEOUS

## 2020-10-25 MED ORDER — INSULIN ASPART 100 UNIT/ML ~~LOC~~ SOLN
0.0000 [IU] | Freq: Three times a day (TID) | SUBCUTANEOUS | Status: DC
  Administered 2020-10-26: 11 [IU] via SUBCUTANEOUS
  Administered 2020-10-26: 2 [IU] via SUBCUTANEOUS
  Administered 2020-10-26 – 2020-10-27 (×4): 5 [IU] via SUBCUTANEOUS
  Administered 2020-10-28: 8 [IU] via SUBCUTANEOUS
  Administered 2020-10-28: 12:00:00 3 [IU] via SUBCUTANEOUS
  Administered 2020-10-28: 17:00:00 5 [IU] via SUBCUTANEOUS
  Administered 2020-10-29 (×2): 8 [IU] via SUBCUTANEOUS
  Administered 2020-10-30 (×2): 5 [IU] via SUBCUTANEOUS
  Administered 2020-10-30: 09:00:00 2 [IU] via SUBCUTANEOUS
  Administered 2020-10-31: 12:00:00 5 [IU] via SUBCUTANEOUS
  Administered 2020-10-31: 2 [IU] via SUBCUTANEOUS
  Administered 2020-10-31: 09:00:00 5 [IU] via SUBCUTANEOUS
  Administered 2020-11-01 (×2): 8 [IU] via SUBCUTANEOUS
  Administered 2020-11-01: 13:00:00 11 [IU] via SUBCUTANEOUS
  Administered 2020-11-02: 16:00:00 8 [IU] via SUBCUTANEOUS
  Administered 2020-11-02: 3 [IU] via SUBCUTANEOUS
  Administered 2020-11-02: 09:00:00 5 [IU] via SUBCUTANEOUS
  Administered 2020-11-03 – 2020-11-04 (×5): 3 [IU] via SUBCUTANEOUS
  Administered 2020-11-04: 18:00:00 5 [IU] via SUBCUTANEOUS
  Administered 2020-11-05: 13:00:00 3 [IU] via SUBCUTANEOUS
  Administered 2020-11-05: 16:00:00 5 [IU] via SUBCUTANEOUS
  Administered 2020-11-05: 08:00:00 2 [IU] via SUBCUTANEOUS
  Administered 2020-11-06: 3 [IU] via SUBCUTANEOUS
  Administered 2020-11-06: 8 [IU] via SUBCUTANEOUS
  Administered 2020-11-06 – 2020-11-07 (×2): 5 [IU] via SUBCUTANEOUS
  Administered 2020-11-07: 3 [IU] via SUBCUTANEOUS
  Administered 2020-11-07: 5 [IU] via SUBCUTANEOUS
  Administered 2020-11-08: 3 [IU] via SUBCUTANEOUS
  Administered 2020-11-08: 09:00:00 2 [IU] via SUBCUTANEOUS
  Administered 2020-11-08: 5 [IU] via SUBCUTANEOUS
  Administered 2020-11-09: 2 [IU] via SUBCUTANEOUS
  Administered 2020-11-09: 8 [IU] via SUBCUTANEOUS
  Administered 2020-11-09: 13:00:00 5 [IU] via SUBCUTANEOUS
  Administered 2020-11-10 (×2): 3 [IU] via SUBCUTANEOUS
  Administered 2020-11-10: 18:00:00 8 [IU] via SUBCUTANEOUS
  Administered 2020-11-11: 17:00:00 5 [IU] via SUBCUTANEOUS
  Administered 2020-11-12: 3 [IU] via SUBCUTANEOUS
  Administered 2020-11-12: 5 [IU] via SUBCUTANEOUS
  Administered 2020-11-12: 2 [IU] via SUBCUTANEOUS
  Administered 2020-11-13 – 2020-11-14 (×3): 3 [IU] via SUBCUTANEOUS
  Administered 2020-11-15: 17:00:00 5 [IU] via SUBCUTANEOUS
  Administered 2020-11-15: 3 [IU] via SUBCUTANEOUS
  Administered 2020-11-16 (×2): 5 [IU] via SUBCUTANEOUS
  Administered 2020-11-17: 08:00:00 2 [IU] via SUBCUTANEOUS
  Administered 2020-11-17: 3 [IU] via SUBCUTANEOUS
  Administered 2020-11-17: 17:00:00 5 [IU] via SUBCUTANEOUS
  Administered 2020-11-18: 8 [IU] via SUBCUTANEOUS
  Administered 2020-11-18: 2 [IU] via SUBCUTANEOUS
  Administered 2020-11-18: 3 [IU] via SUBCUTANEOUS
  Administered 2020-11-19: 14:00:00 5 [IU] via SUBCUTANEOUS
  Administered 2020-11-19: 3 [IU] via SUBCUTANEOUS
  Administered 2020-11-20: 14:00:00 2 [IU] via SUBCUTANEOUS
  Administered 2020-11-20: 18:00:00 5 [IU] via SUBCUTANEOUS
  Administered 2020-11-21: 3 [IU] via SUBCUTANEOUS
  Administered 2020-11-21 – 2020-11-22 (×5): 2 [IU] via SUBCUTANEOUS
  Administered 2020-11-23 (×2): 5 [IU] via SUBCUTANEOUS
  Administered 2020-11-23: 2 [IU] via SUBCUTANEOUS
  Administered 2020-11-24: 5 [IU] via SUBCUTANEOUS
  Administered 2020-11-24: 3 [IU] via SUBCUTANEOUS
  Administered 2020-11-24: 2 [IU] via SUBCUTANEOUS
  Administered 2020-11-25: 8 [IU] via SUBCUTANEOUS
  Administered 2020-11-25: 2 [IU] via SUBCUTANEOUS
  Administered 2020-11-26 (×2): 3 [IU] via SUBCUTANEOUS
  Administered 2020-11-26: 2 [IU] via SUBCUTANEOUS
  Administered 2020-11-27 (×2): 3 [IU] via SUBCUTANEOUS
  Administered 2020-11-27 – 2020-11-28 (×2): 2 [IU] via SUBCUTANEOUS
  Administered 2020-11-28 – 2020-11-29 (×4): 3 [IU] via SUBCUTANEOUS
  Filled 2020-10-25 (×3): qty 1

## 2020-10-25 MED ORDER — LACTATED RINGERS IV SOLN: INTRAVENOUS | Status: AC

## 2020-10-25 MED ORDER — VANCOMYCIN HCL IN DEXTROSE 1-5 GM/200ML-% IV SOLN
1000.0000 mg | Freq: Two times a day (BID) | INTRAVENOUS | Status: DC
  Administered 2020-10-26 – 2020-10-27 (×4): 1000 mg via INTRAVENOUS
  Filled 2020-10-25 (×5): qty 200

## 2020-10-25 MED ORDER — ATORVASTATIN CALCIUM 40 MG PO TABS
40.0000 mg | ORAL_TABLET | Freq: Every day | ORAL | Status: DC
  Administered 2020-10-25: 40 mg via ORAL
  Filled 2020-10-25: qty 1

## 2020-10-25 MED ORDER — LACTATED RINGERS IV BOLUS (SEPSIS)
500.0000 mL | Freq: Once | INTRAVENOUS | Status: AC
  Administered 2020-10-25: 500 mL via INTRAVENOUS

## 2020-10-25 MED ORDER — GLUCERNA SHAKE PO LIQD
237.0000 mL | Freq: Three times a day (TID) | ORAL | Status: DC
  Administered 2020-10-26 – 2020-11-22 (×58): 237 mL via ORAL
  Filled 2020-10-25 (×16): qty 237

## 2020-10-25 MED ORDER — SODIUM CHLORIDE 0.9 % IV SOLN
2.0000 g | Freq: Three times a day (TID) | INTRAVENOUS | Status: DC
  Administered 2020-10-25 – 2020-11-01 (×20): 2 g via INTRAVENOUS
  Filled 2020-10-25 (×20): qty 2

## 2020-10-25 MED ORDER — POTASSIUM CHLORIDE CRYS ER 20 MEQ PO TBCR
40.0000 meq | EXTENDED_RELEASE_TABLET | Freq: Once | ORAL | Status: AC
  Administered 2020-10-25: 40 meq via ORAL
  Filled 2020-10-25: qty 2

## 2020-10-25 MED ORDER — BACLOFEN 10 MG PO TABS
5.0000 mg | ORAL_TABLET | Freq: Three times a day (TID) | ORAL | Status: DC
  Administered 2020-10-25 – 2020-11-22 (×73): 5 mg via ORAL
  Filled 2020-10-25 (×73): qty 1

## 2020-10-25 MED ORDER — SODIUM CHLORIDE 0.9 % IV SOLN
2.0000 g | Freq: Once | INTRAVENOUS | Status: AC
  Administered 2020-10-25: 2 g via INTRAVENOUS
  Filled 2020-10-25: qty 20

## 2020-10-25 MED ORDER — VANCOMYCIN HCL 2000 MG/400ML IV SOLN
2000.0000 mg | INTRAVENOUS | Status: AC
  Administered 2020-10-25: 2000 mg via INTRAVENOUS
  Filled 2020-10-25: qty 400
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
## 2020-10-25 NOTE — ED Triage Notes (Signed)
Pt brought in by EMS due to ulcers to left heel and left great toe. Pt reports sores present for 3 months and getting worse. Pt has redness to left foot and swelling bilaterally

## 2020-10-25 NOTE — Progress Notes (Addendum)
Pharmacy Antibiotic Note  [name] is a 70 y.o. male admitted on 10/25/2020 with wound infection. Pharmacy has been consulted for Vanco dosing.  CC/HPI: L heel and L great toe ulcers worsening x 3 months.  ID: Wound infections to L foot/toe. Tmax 99.2. Tachy. WBC elevated 15.2.  Vanco 1/14> Cefepime 1/14>>   Vancomycin 1000mg  IV Q 12 hrs. Goal AUC 400-550. Expected AUC: 525 SCr used: 0.96  Plan: Vanco 2g IV x 1 load then 1g IV q12 hours. Peak/trough for AUC levels at steady state if continued. Ok per Dr. Caleb Popp to add Cefepime 2g IV q8hr.    Height: 5\' 9"  (175.3 cm) Weight: 116.1 kg (256 lb) IBW/kg (Calculated) : 70.7  Temp (24hrs), Avg:99.2 F (37.3 C), Min:99.2 F (37.3 C), Max:99.2 F (37.3 C)  Recent Labs  Lab 10/25/20 1835  WBC 15.2*  CREATININE 0.96  LATICACIDVEN 1.5    Estimated Creatinine Clearance: 92.6 mL/min (by C-G formula based on SCr of 0.96 mg/dL).    Allergies  Allergen Reactions  . Flexeril [Cyclobenzaprine] Shortness Of Breath  . Tamsulosin Hcl Swelling, Rash and Other (See Comments)    Swelling around the mouth with rash on face Swelling around the mouth with rash on face Swelling around the mouth with rash on face  Swelling around the mouth with rash on face  . Tramadol Other (See Comments)    Headache  . Robaxin [Methocarbamol] Rash  . Aspirin     On plavix  . Morphine And Related Itching    [name] S. Alford Highland, PharmD, BCPS Clinical Staff Pharmacist [email or web address] [place] 10/25/2020 8:29 PM

## 2020-10-25 NOTE — ED Notes (Signed)
Pt has a ulcer to his left great toe & the toe beside the great toe. Area to the great toe is blackened with ulcered areas to the top of the other toes on his left foot. Pt has +2 pitting edema to the left foot with an ulcer noted to his heel. Weak dorsal pedal pulse noted.

## 2020-10-25 NOTE — ED Notes (Signed)
Hospitalist at the bedside 

## 2020-10-26 ENCOUNTER — Inpatient Hospital Stay (HOSPITAL_COMMUNITY): Payer: Medicare Other

## 2020-10-26 DIAGNOSIS — I2510 Atherosclerotic heart disease of native coronary artery without angina pectoris: Secondary | ICD-10-CM | POA: Diagnosis not present

## 2020-10-26 DIAGNOSIS — A419 Sepsis, unspecified organism: Secondary | ICD-10-CM | POA: Diagnosis present

## 2020-10-26 DIAGNOSIS — M86172 Other acute osteomyelitis, left ankle and foot: Secondary | ICD-10-CM | POA: Diagnosis not present

## 2020-10-26 DIAGNOSIS — R6510 Systemic inflammatory response syndrome (SIRS) of non-infectious origin without acute organ dysfunction: Secondary | ICD-10-CM | POA: Insufficient documentation

## 2020-10-26 DIAGNOSIS — I10 Essential (primary) hypertension: Secondary | ICD-10-CM | POA: Diagnosis not present

## 2020-10-26 LAB — BLOOD GAS, ARTERIAL
Acid-base deficit: 5.7 mmol/L — ABNORMAL HIGH (ref 0.0–2.0)
Bicarbonate: 20.5 mmol/L (ref 20.0–28.0)
FIO2: 80
O2 Saturation: 97.6 %
Patient temperature: 37
pCO2 arterial: 26.1 mmHg — ABNORMAL LOW (ref 32.0–48.0)
pH, Arterial: 7.446 (ref 7.350–7.450)
pO2, Arterial: 108 mmHg (ref 83.0–108.0)

## 2020-10-26 LAB — CBC
HCT: 36.9 % — ABNORMAL LOW (ref 39.0–52.0)
Hemoglobin: 11.5 g/dL — ABNORMAL LOW (ref 13.0–17.0)
MCH: 30.6 pg (ref 26.0–34.0)
MCHC: 31.2 g/dL (ref 30.0–36.0)
MCV: 98.1 fL (ref 80.0–100.0)
Platelets: 281 10*3/uL (ref 150–400)
RBC: 3.76 MIL/uL — ABNORMAL LOW (ref 4.22–5.81)
RDW: 15.7 % — ABNORMAL HIGH (ref 11.5–15.5)
WBC: 12.9 10*3/uL — ABNORMAL HIGH (ref 4.0–10.5)
nRBC: 0 % (ref 0.0–0.2)

## 2020-10-26 LAB — PHOSPHORUS: Phosphorus: 3.3 mg/dL (ref 2.5–4.6)

## 2020-10-26 LAB — PROTIME-INR
INR: 1.3 — ABNORMAL HIGH (ref 0.8–1.2)
Prothrombin Time: 15.3 seconds — ABNORMAL HIGH (ref 11.4–15.2)

## 2020-10-26 LAB — COMPREHENSIVE METABOLIC PANEL
ALT: 21 U/L (ref 0–44)
AST: 34 U/L (ref 15–41)
Albumin: 2.4 g/dL — ABNORMAL LOW (ref 3.5–5.0)
Alkaline Phosphatase: 142 U/L — ABNORMAL HIGH (ref 38–126)
Anion gap: 18 — ABNORMAL HIGH (ref 5–15)
BUN: 14 mg/dL (ref 8–23)
CO2: 20 mmol/L — ABNORMAL LOW (ref 22–32)
Calcium: 8.2 mg/dL — ABNORMAL LOW (ref 8.9–10.3)
Chloride: 103 mmol/L (ref 98–111)
Creatinine, Ser: 1.09 mg/dL (ref 0.61–1.24)
GFR, Estimated: >60 mL/min (ref >60)
Glucose, Bld: 193 mg/dL — ABNORMAL HIGH (ref 70–99)
Potassium: 2.9 mmol/L — ABNORMAL LOW (ref 3.5–5.1)
Sodium: 141 mmol/L (ref 135–145)
Total Bilirubin: 0.9 mg/dL (ref 0.3–1.2)
Total Protein: 7 g/dL (ref 6.5–8.1)

## 2020-10-26 LAB — HEMOGLOBIN A1C
Hgb A1c MFr Bld: 6.9 % — ABNORMAL HIGH (ref 4.8–5.6)
Mean Plasma Glucose: 151.33 mg/dL

## 2020-10-26 LAB — HIV ANTIBODY (ROUTINE TESTING W REFLEX): HIV Screen 4th Generation wRfx: NONREACTIVE

## 2020-10-26 LAB — CBG MONITORING, ED
Glucose-Capillary: 206 mg/dL — ABNORMAL HIGH (ref 70–99)
Glucose-Capillary: 328 mg/dL — ABNORMAL HIGH (ref 70–99)
Glucose-Capillary: 245 mg/dL — ABNORMAL HIGH (ref 70–99)

## 2020-10-26 LAB — GLUCOSE, CAPILLARY: Glucose-Capillary: 208 mg/dL — ABNORMAL HIGH (ref 70–99)

## 2020-10-26 LAB — APTT: aPTT: 35 seconds (ref 24–36)

## 2020-10-26 LAB — MAGNESIUM: Magnesium: 1.4 mg/dL — ABNORMAL LOW (ref 1.7–2.4)

## 2020-10-26 LAB — OCCULT BLOOD X 1 CARD TO LAB, STOOL: Fecal Occult Bld: NEGATIVE

## 2020-10-26 MED ORDER — IPRATROPIUM-ALBUTEROL 0.5-2.5 (3) MG/3ML IN SOLN
3.0000 mL | Freq: Once | RESPIRATORY_TRACT | Status: AC
  Administered 2020-10-26: 3 mL via RESPIRATORY_TRACT

## 2020-10-26 MED ORDER — ENOXAPARIN SODIUM 120 MG/0.8ML ~~LOC~~ SOLN
120.0000 mg | Freq: Two times a day (BID) | SUBCUTANEOUS | Status: DC
  Administered 2020-10-26: 120 mg via SUBCUTANEOUS
  Filled 2020-10-26 (×2): qty 0.8

## 2020-10-26 MED ORDER — CLOPIDOGREL BISULFATE 75 MG PO TABS
75.0000 mg | ORAL_TABLET | Freq: Every day | ORAL | Status: DC
  Administered 2020-10-26 – 2020-11-14 (×17): 75 mg via ORAL
  Filled 2020-10-26 (×17): qty 1

## 2020-10-26 MED ORDER — AMIODARONE HCL IN DEXTROSE 360-4.14 MG/200ML-% IV SOLN
60.0000 mg/h | INTRAVENOUS | Status: AC
  Administered 2020-10-26: 60 mg/h via INTRAVENOUS
  Filled 2020-10-26: qty 200

## 2020-10-26 MED ORDER — ALBUTEROL SULFATE (2.5 MG/3ML) 0.083% IN NEBU: 2.5000 mg | INHALATION_SOLUTION | Freq: Once | RESPIRATORY_TRACT | Status: AC

## 2020-10-26 MED ORDER — IPRATROPIUM BROMIDE 0.02 % IN SOLN
RESPIRATORY_TRACT | Status: AC
  Filled 2020-10-26: qty 2.5

## 2020-10-26 MED ORDER — OXYCODONE-ACETAMINOPHEN 5-325 MG PO TABS
1.0000 | ORAL_TABLET | ORAL | Status: DC | PRN
  Administered 2020-10-26 – 2020-11-14 (×47): 1 via ORAL
  Filled 2020-10-26 (×48): qty 1

## 2020-10-26 MED ORDER — ALBUTEROL SULFATE (2.5 MG/3ML) 0.083% IN NEBU
INHALATION_SOLUTION | RESPIRATORY_TRACT | Status: AC
  Filled 2020-10-26: qty 3

## 2020-10-26 MED ORDER — AMIODARONE HCL IN DEXTROSE 360-4.14 MG/200ML-% IV SOLN
30.0000 mg/h | INTRAVENOUS | Status: DC
  Administered 2020-10-26 – 2020-11-03 (×14): 30 mg/h via INTRAVENOUS
  Filled 2020-10-26 (×19): qty 200

## 2020-10-26 MED ORDER — MAGNESIUM SULFATE 4 GM/100ML IV SOLN
4.0000 g | Freq: Once | INTRAVENOUS | Status: AC
  Administered 2020-10-26: 4 g via INTRAVENOUS
  Filled 2020-10-26: qty 100

## 2020-10-26 MED ORDER — ALBUTEROL SULFATE (2.5 MG/3ML) 0.083% IN NEBU
INHALATION_SOLUTION | RESPIRATORY_TRACT | Status: AC
  Administered 2020-10-26: 2.5 mg
  Filled 2020-10-26: qty 3

## 2020-10-26 MED ORDER — APIXABAN 5 MG PO TABS: 5.0000 mg | ORAL_TABLET | Freq: Two times a day (BID) | ORAL | Status: DC

## 2020-10-26 MED ORDER — CHLORHEXIDINE GLUCONATE CLOTH 2 % EX PADS
6.0000 | MEDICATED_PAD | Freq: Every day | CUTANEOUS | Status: DC
  Administered 2020-10-26 – 2020-11-29 (×31): 6 via TOPICAL

## 2020-10-26 MED ORDER — POTASSIUM CHLORIDE CRYS ER 20 MEQ PO TBCR
40.0000 meq | EXTENDED_RELEASE_TABLET | ORAL | Status: AC
  Administered 2020-10-26 (×2): 40 meq via ORAL
  Filled 2020-10-26 (×2): qty 2

## 2020-10-26 MED ORDER — ENOXAPARIN SODIUM 60 MG/0.6ML ~~LOC~~ SOLN: 60.0000 mg | SUBCUTANEOUS | Status: DC

## 2020-10-26 NOTE — ED Notes (Signed)
Lab at the bedside 

## 2020-10-26 NOTE — ED Notes (Signed)
In & out witnessed by [place] (tech).

## 2020-10-26 NOTE — Plan of Care (Signed)
  Problem: Acute Rehab PT Goals(only PT should resolve) Goal: Pt Will Go Supine/Side To Sit Outcome: Progressing Flowsheets (Taken 10/26/2020 1336) Pt will go Supine/Side to Sit:  with minimal assist  with moderate assist Goal: Patient Will Transfer Sit To/From Stand Outcome: Progressing Flowsheets (Taken 10/26/2020 1336) Patient will transfer sit to/from stand: with moderate assist Goal: Pt Will Transfer Bed To Chair/Chair To Bed Outcome: Progressing Flowsheets (Taken 10/26/2020 1336) Pt will Transfer Bed to Chair/Chair to Bed: with mod assist Goal: Pt Will Ambulate Outcome: Progressing Flowsheets (Taken 10/26/2020 1336) Pt will Ambulate:  10 feet  with moderate assist Note: Quad-cane of Hemi-walker   1:37 PM, 10/26/20 Lonell Grandchild, MPT Physical Therapist with [facility] 336 [phone] office [phone] mobile phone
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
## 2020-10-26 NOTE — Progress Notes (Signed)
Patient Demographics:    [name], is a 70 y.o. male, DOB - January 01, 1951, OFH:[id number]  Admit date - 10/25/2020   Admitting Physician Bernadette Hoit, DO  Outpatient Primary MD for the patient is Leeanne Rio, MD  LOS - 1   Chief Complaint  Patient presents with  . Foot Pain  . Foot Ulcer        Subjective:    [name] today has no fevers, no emesis,  No chest pain,  -Resting comfortably--was tachycardic earlier  Assessment  & Plan :    Principal Problem:   Sepsis --due to Infected Lt Foot/Ostepmyleitis---POA Active Problems:   Acute osteomyelitis of toe, left (HCC)   Diabetic ulcer of left heel (HCC)   Atrial flutter (HCC)   Mixed hyperlipidemia   CAD, multiple vessel, 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   Essential hypertension   GERD (gastroesophageal reflux disease)   BPH (benign prostatic hyperplasia)   Fracture of left toe   Leukocytosis   Hypokalemia   Hypoalbuminemia   Hyperglycemia due to diabetes mellitus ([place])   History of stroke   Brief Summary:- 70 y.o. male with medical history significant for hypertension, hyperlipidemia, T2DM, atrial flutter on Eliquis, history of stroke with residual left hemiparesis, CAD admitted with sepsis due to infected left foot/osteomyelitis  A/p  1)  Sepsis --due to Infected Lt Foot/Ostepmyleitis---POA--- patient met sepsis criteria on admission--- with tachycardia, tachypnea, leukocytosis and clinical and imaging finding left foot infection/osteomyelitis -Continue vancomycin and cefepime pending cultures -Surgical consult for possible debridement   2) paroxysmal A. fib/a flutter--- with RVR, now on IV amiodarone drip plan to transition back to oral amiodarone drip in a.m. -Currently on therapeutic Lovenox plan to transition back to apixaban in a.m.  3)CAD--status post prior CABG, no ACS type symptoms continue Plavix and  Lipitor  4)HTN--stable, continue lisinopril  5)DM2- .  A1c 6.9 reflecting excellent diabetic control PTA -Use Novolog/Humalog Sliding scale insulin with Accu-Cheks/Fingersticks as ordered   6) prior history of stroke--no acute concerns at this time okay to continue atorvastatin and clopidogrel  7)GERD--- continue Protonix  8)BPH--stable continue Proscar  9) hypokalemia/hypomagnesemia--replace and recheck  10) chronic anemia--hemoglobin currently above 11 which is close to prior baseline  -Monitor closely for bleeding signs while on anticoagulation  Disposition/Need for in-Hospital Stay- patient unable to be discharged at this time due to sepsis due to infected left foot/osteomyelitis requiring IV antibiotics  Status is: Inpatient  Remains inpatient appropriate because:Please see above   Disposition: The patient is from: Home              Anticipated d/c is to: Home              Anticipated d/c date is: 2 days              Patient currently is not medically stable to d/c. Barriers: Not Clinically Stable-   Code Status : -  Code Status: Full Code   Family Communication:   NA (patient is alert, awake and coherent)   Consults  :  na  DVT Prophylaxis  :   - SCDs  SCDs Start: 10/25/20 2246 Lovenox   Lab Results  Component Value Date   PLT 281 10/26/2020    Inpatient  Medications  Scheduled Meds: . baclofen  5 mg Oral TID  . clopidogrel  75 mg Oral Daily  . enoxaparin (LOVENOX) injection  120 mg Subcutaneous BID  . feeding supplement (GLUCERNA SHAKE)  237 mL Oral TID BM  . finasteride  5 mg Oral Q1200  . insulin aspart  0-15 Units Subcutaneous TID WC  . insulin aspart  0-5 Units Subcutaneous QHS  . lisinopril  2.5 mg Oral Daily  . pantoprazole  40 mg Oral Daily   Continuous Infusions: . amiodarone 30 mg/hr (10/26/20 1150)  . ceFEPime (MAXIPIME) IV 2 g (10/26/20 1332)  . vancomycin Stopped (10/26/20 1351)   PRN Meds:.acetaminophen,  oxyCODONE-acetaminophen    Anti-infectives (From admission, onward)   Start     Dose/Rate Route Frequency Ordered Stop   10/26/20 1000  vancomycin (VANCOCIN) IVPB 1000 mg/200 mL premix        1,000 mg 200 mL/hr over 60 Minutes Intravenous Every 12 hours 10/25/20 2037     10/25/20 2200  ceFEPIme (MAXIPIME) 2 g in sodium chloride 0.9 % 100 mL IVPB        2 g 200 mL/hr over 30 Minutes Intravenous Every 8 hours 10/25/20 2037     10/25/20 2030  vancomycin (VANCOREADY) IVPB 2000 mg/400 mL        2,000 mg 200 mL/hr over 120 Minutes Intravenous NOW 10/25/20 2027 10/25/20 2248   10/25/20 1930  vancomycin (VANCOCIN) IVPB 1000 mg/200 mL premix  Status:  Discontinued        1,000 mg 200 mL/hr over 60 Minutes Intravenous  Once 10/25/20 1928 10/25/20 2136   10/25/20 1930  cefTRIAXone (ROCEPHIN) 2 g in sodium chloride 0.9 % 100 mL IVPB        2 g 200 mL/hr over 30 Minutes Intravenous  Once 10/25/20 1928 10/25/20 2040        Objective:   Vitals:   10/26/20 1530 10/26/20 1600 10/26/20 1630 10/26/20 1700  BP: (!) 134/96 (!) 135/113 (!) 151/87 (!) 152/97  Pulse: (!) 107 (!) 56 (!) 58 (!) 106  Resp: (!) 26 (!) 29 20 14   Temp:      TempSrc:      SpO2: 97% 97% 99% 96%  Weight:      Height:        Wt Readings from Last 3 Encounters:  10/25/20 116.1 kg  05/31/20 114.2 kg  03/25/20 112.8 kg     Intake/Output Summary (Last 24 hours) at 10/26/2020 1709 Last data filed at 10/26/2020 1351 Gross per 24 hour  Intake 2683.49 ml  Output --  Net 2683.49 ml   Physical Exam  Gen:- Awake Alert, obese, in no acute distress HEENT:- [place].AT, No sclera icterus Neck-Supple Neck,No JVD,.  Lungs-diminished in bases, no wheezing CV- S1, S2 normal, irregularly irregular  abd-  +ve B.Sounds, Abd Soft, No tenderness,    Extremity/Skin:- + edema, pedal pulses present  Psych-affect is appropriate, oriented x3 Neuro-generalized weakness, no new focal deficits, no tremors MSK--- left foot ulcerations, left  heel necrotic eschar-please see photos in epic   Data Review:   Micro Results Recent Results (from the past 240 hour(s))  Blood culture (routine single)     Status: None (Preliminary result)   Collection Time: 10/25/20  6:35 PM   Specimen: Right Antecubital; Blood  Result Value Ref Range Status   Specimen Description   Final    RIGHT ANTECUBITAL BOTTLES DRAWN AEROBIC AND ANAEROBIC Blood Culture adequate volume   Special Requests NONE  Final  Culture   Final    NO GROWTH < 12 HOURS Performed at [facility], [address] [place], [place] [address]    Report Status PENDING  Incomplete  Resp Panel by RT-PCR (Flu A&B, Covid) Nasopharyngeal Swab     Status: None   Collection Time: 10/25/20  6:37 PM   Specimen: Nasopharyngeal Swab; Nasopharyngeal(NP) swabs in vial transport medium  Result Value Ref Range Status   SARS Coronavirus 2 by RT PCR NEGATIVE NEGATIVE Final    Comment: (NOTE) SARS-CoV-2 target nucleic acids are NOT DETECTED.  The SARS-CoV-2 RNA is generally detectable in upper respiratory specimens during the acute phase of infection. The lowest concentration of SARS-CoV-2 viral copies this assay can detect is 138 copies/mL. A negative result does not preclude SARS-Cov-2 infection and should not be used as the sole basis for treatment or other patient management decisions. A negative result may occur with  improper specimen collection/handling, submission of specimen other than nasopharyngeal swab, presence of viral mutation(s) within the areas targeted by this assay, and inadequate number of viral copies(<138 copies/mL). A negative result must be combined with clinical observations, patient history, and epidemiological information. The expected result is Negative.  Fact Sheet for Patients:  EntrepreneurPulse.com.au  Fact Sheet for Healthcare Providers:  IncredibleEmployment.be  This test is no t yet approved or cleared by the  [place] FDA and  has been authorized for detection and/or diagnosis of SARS-CoV-2 by FDA under an Emergency Use Authorization (EUA). This EUA will remain  in effect (meaning this test can be used) for the duration of the COVID-19 declaration under Section 564(b)(1) of the Act, 21 U.S.C.section 360bbb-3(b)(1), unless the authorization is terminated  or revoked sooner.       Influenza A by PCR NEGATIVE NEGATIVE Final   Influenza B by PCR NEGATIVE NEGATIVE Final    Comment: (NOTE) The Xpert Xpress SARS-CoV-2/FLU/RSV plus assay is intended as an aid in the diagnosis of influenza from Nasopharyngeal swab specimens and should not be used as a sole basis for treatment. Nasal washings and aspirates are unacceptable for Xpert Xpress SARS-CoV-2/FLU/RSV testing.  Fact Sheet for Patients: EntrepreneurPulse.com.au  Fact Sheet for Healthcare Providers: IncredibleEmployment.be  This test is not yet approved or cleared by the [place] FDA and has been authorized for detection and/or diagnosis of SARS-CoV-2 by FDA under an Emergency Use Authorization (EUA). This EUA will remain in effect (meaning this test can be used) for the duration of the COVID-19 declaration under Section 564(b)(1) of the Act, 21 U.S.C. section 360bbb-3(b)(1), unless the authorization is terminated or revoked.  Performed at [facility], [address] [place], [place] [address]     Radiology Reports DG Chest 1 View  Result Date: 10/26/2020 CLINICAL DATA:  Hypertension EXAM: CHEST  1 VIEW COMPARISON:  September 16, 2018 FINDINGS: There are chronic bronchitic changes at the lung bases bilaterally. There is cardiomegaly. The patient is status post prior median sternotomy. There are prominent interstitial lung markings at the lung bases suggestive of interstitial edema. There is no pneumothorax or large pleural effusion. No acute osseous abnormality. IMPRESSION: 1. Cardiomegaly  with mild interstitial edema. 2. Chronic bronchitic changes at the lung bases bilaterally. Electronically Signed   By: Constance Holster M.D.   On: 10/26/2020 04:49   DG Foot Complete Left  Result Date: 10/25/2020 CLINICAL DATA:  Ulcer to the great toe and heel EXAM: LEFT FOOT - COMPLETE 3+ VIEW COMPARISON:  05/21/2015 FINDINGS: No acute displaced fracture or malalignment is seen.  Large ulcer on the medial side of the first digit. Cortical erosive change on the medial side of first distal phalanx and at the head of the first proximal phalanx suspicious for osteomyelitis. Chronic fracture deformity first proximal phalanx. Sclerosis and mild deformity at the necks of the fourth and fifth metatarsals, suspicious for subacute to developing chronic fractures. No erosive change at the calcaneus. No soft tissue emphysema. Vascular calcifications. IMPRESSION: 1. Cortical erosive change on the medial side of the first distal phalanx and head of the first proximal phalanx suspicious for osteomyelitis. Large overlying ulcer medial side first digit 2. Probable subacute to developing chronic fractures involving the necks of the fourth and fifth metatarsals. Electronically Signed   By: Donavan Foil M.D.   On: 10/25/2020 19:11     CBC Recent Labs  Lab 10/25/20 1835 10/26/20 0431  WBC 15.2* 12.9*  HGB 11.3* 11.5*  HCT 35.0* 36.9*  PLT 286 281  MCV 95.4 98.1  MCH 30.8 30.6  MCHC 32.3 31.2  RDW 15.6* 15.7*  LYMPHSABS 1.7  --   MONOABS 1.0  --   EOSABS 0.1  --   BASOSABS 0.1  --     Chemistries  Recent Labs  Lab 10/25/20 1835 10/26/20 0431  NA 140 141  K 2.9* 2.9*  CL 103 103  CO2 26 20*  GLUCOSE 161* 193*  BUN 14 14  CREATININE 0.96 1.09  CALCIUM 8.4* 8.2*  MG  --  1.4*  AST 23 34  ALT 20 21  ALKPHOS 107 142*  BILITOT 0.7 0.9   ------------------------------------------------------------------------------------------------------------------ No results for input(s): CHOL, HDL, LDLCALC,  TRIG, CHOLHDL, LDLDIRECT in the last 72 hours.  Lab Results  Component Value Date   HGBA1C 6.9 (H) 10/25/2020   ------------------------------------------------------------------------------------------------------------------ No results for input(s): TSH, T4TOTAL, T3FREE, THYROIDAB in the last 72 hours.  Invalid input(s): FREET3 ------------------------------------------------------------------------------------------------------------------ No results for input(s): VITAMINB12, FOLATE, FERRITIN, TIBC, IRON, RETICCTPCT in the last 72 hours.  Coagulation profile Recent Labs  Lab 10/25/20 1835 10/26/20 0431  INR 1.2 1.3*    No results for input(s): DDIMER in the last 72 hours.  Cardiac Enzymes No results for input(s): CKMB, TROPONINI, MYOGLOBIN in the last 168 hours.  Invalid input(s): CK ------------------------------------------------------------------------------------------------------------------ No results found for: BNP   Roxan Hockey M.D on 10/26/2020 at 5:09 PM  Go to [email or web address] - for contact info  [facility] - Office  [phone]
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
## 2020-10-26 NOTE — ED Notes (Signed)
Attending MD at the bedside

## 2020-10-26 NOTE — ED Notes (Signed)
Pt's sleeping

## 2020-10-26 NOTE — ED Notes (Signed)
Spoke with pts wife (with his permission) & updated her on pts status.

## 2020-10-26 NOTE — ED Notes (Signed)
Xray at the bedside.

## 2020-10-26 NOTE — ED Notes (Signed)
RT at the bedside.
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
## 2020-10-26 NOTE — ED Notes (Signed)
Changed pts linens & himself around 3:15 & pt became SOB after moving around in the bed. His heart rate is in the  160's to 170's. Rt called & breathing tx's administered. Attending MD updated. Spoke with attending & orders placed by him. Pts restless & continues to take his pulse ox & O2 off.

## 2020-10-26 NOTE — ED Notes (Signed)
Pt moved to room 5 for closer observation.

## 2020-10-27 ENCOUNTER — Inpatient Hospital Stay: Payer: Self-pay

## 2020-10-27 ENCOUNTER — Inpatient Hospital Stay (HOSPITAL_COMMUNITY): Payer: Medicare Other

## 2020-10-27 DIAGNOSIS — I2510 Atherosclerotic heart disease of native coronary artery without angina pectoris: Secondary | ICD-10-CM | POA: Diagnosis not present

## 2020-10-27 DIAGNOSIS — A419 Sepsis, unspecified organism: Secondary | ICD-10-CM | POA: Diagnosis not present

## 2020-10-27 DIAGNOSIS — M86172 Other acute osteomyelitis, left ankle and foot: Secondary | ICD-10-CM | POA: Diagnosis not present

## 2020-10-27 DIAGNOSIS — I10 Essential (primary) hypertension: Secondary | ICD-10-CM | POA: Diagnosis not present

## 2020-10-27 LAB — BASIC METABOLIC PANEL
Anion gap: 9 (ref 5–15)
BUN: 23 mg/dL (ref 8–23)
CO2: 23 mmol/L (ref 22–32)
Calcium: 7.9 mg/dL — ABNORMAL LOW (ref 8.9–10.3)
Chloride: 104 mmol/L (ref 98–111)
Creatinine, Ser: 1.11 mg/dL (ref 0.61–1.24)
GFR, Estimated: >60 mL/min (ref >60)
Glucose, Bld: 229 mg/dL — ABNORMAL HIGH (ref 70–99)
Potassium: 4.2 mmol/L (ref 3.5–5.1)
Sodium: 136 mmol/L (ref 135–145)

## 2020-10-27 LAB — GLUCOSE, CAPILLARY
Glucose-Capillary: 189 mg/dL — ABNORMAL HIGH (ref 70–99)
Glucose-Capillary: 194 mg/dL — ABNORMAL HIGH (ref 70–99)
Glucose-Capillary: 212 mg/dL — ABNORMAL HIGH (ref 70–99)
Glucose-Capillary: 217 mg/dL — ABNORMAL HIGH (ref 70–99)
Glucose-Capillary: 249 mg/dL — ABNORMAL HIGH (ref 70–99)

## 2020-10-27 LAB — MRSA PCR SCREENING: MRSA by PCR: NEGATIVE

## 2020-10-27 MED ORDER — ONDANSETRON HCL 4 MG/2ML IJ SOLN
INTRAMUSCULAR | Status: AC
  Administered 2020-10-27: 4 mg via INTRAVENOUS
  Filled 2020-10-27: qty 2

## 2020-10-27 MED ORDER — SODIUM CHLORIDE 0.9% FLUSH: 10.0000 mL | INTRAVENOUS | Status: DC | PRN

## 2020-10-27 MED ORDER — ENOXAPARIN SODIUM 120 MG/0.8ML ~~LOC~~ SOLN
110.0000 mg | Freq: Two times a day (BID) | SUBCUTANEOUS | Status: DC
  Administered 2020-10-28 – 2020-11-09 (×26): 110 mg via SUBCUTANEOUS
  Filled 2020-10-27 (×16): qty 0.74
  Filled 2020-10-27: qty 0.8
  Filled 2020-10-27 (×6): qty 0.74
  Filled 2020-10-27: qty 0.8
  Filled 2020-10-27 (×3): qty 0.74

## 2020-10-27 MED ORDER — ONDANSETRON HCL 4 MG/2ML IJ SOLN
4.0000 mg | Freq: Four times a day (QID) | INTRAMUSCULAR | Status: DC | PRN
  Administered 2020-11-03: 19:00:00 4 mg via INTRAVENOUS
  Filled 2020-10-27: qty 2

## 2020-10-27 MED ORDER — DONEPEZIL HCL 5 MG PO TABS
5.0000 mg | ORAL_TABLET | Freq: Every day | ORAL | Status: DC
  Administered 2020-10-30 – 2020-11-28 (×30): 5 mg via ORAL
  Filled 2020-10-27 (×30): qty 1

## 2020-10-27 MED ORDER — SODIUM CHLORIDE 0.9% FLUSH
10.0000 mL | Freq: Two times a day (BID) | INTRAVENOUS | Status: DC
  Administered 2020-10-28: 20 mL
  Administered 2020-10-29 – 2020-11-29 (×55): 10 mL

## 2020-10-27 MED ORDER — ACETAMINOPHEN 325 MG PO TABS
650.0000 mg | ORAL_TABLET | Freq: Four times a day (QID) | ORAL | Status: DC | PRN
  Administered 2020-11-02 – 2020-11-28 (×27): 650 mg via ORAL
  Filled 2020-10-27 (×27): qty 2

## 2020-10-27 MED ORDER — ONDANSETRON HCL 4 MG/2ML IJ SOLN: 4.0000 mg | Freq: Four times a day (QID) | INTRAMUSCULAR | Status: DC

## 2020-10-27 MED ORDER — MEMANTINE HCL 10 MG PO TABS
5.0000 mg | ORAL_TABLET | Freq: Two times a day (BID) | ORAL | Status: DC
  Administered 2020-10-28 – 2020-11-29 (×61): 5 mg via ORAL
  Filled 2020-10-27 (×62): qty 1

## 2020-10-27 MED ORDER — LORAZEPAM 2 MG/ML IJ SOLN
1.0000 mg | Freq: Four times a day (QID) | INTRAMUSCULAR | Status: DC | PRN
  Administered 2020-10-27 – 2020-10-29 (×3): 1 mg via INTRAVENOUS
  Filled 2020-10-27 (×4): qty 1

## 2020-10-27 NOTE — Progress Notes (Signed)
PICC line is good to use. Janett Billow, RN notified.

## 2020-10-27 NOTE — Progress Notes (Signed)
Peripherally Inserted Central Catheter Placement  The IV Nurse has discussed with the patient and/or persons authorized to consent for the patient, the purpose of this procedure and the potential benefits and risks involved with this procedure.  The benefits include less needle sticks, lab draws from the catheter, and the patient may be discharged home with the catheter. Risks include, but not limited to, infection, bleeding, blood clot (thrombus formation), and puncture of an artery; nerve damage and irregular heartbeat and possibility to perform a PICC exchange if needed/ordered by physician.  Alternatives to this procedure were also discussed.  Bard Power PICC patient education guide, fact sheet on infection prevention and patient information card has been provided to patient /or left at bedside.    PICC Placement Documentation  PICC Single Lumen 02/63/78 PICC Right Basilic 46 cm 0 cm (Active)  Indication for Insertion or Continuance of Line Limited venous access - need for IV therapy >5 days (PICC only) 10/27/20 1500  Exposed Catheter (cm) 0 cm 10/27/20 1500  Site Assessment Clean;Dry;Intact 10/27/20 1500  Line Status Blood return noted;Flushed;Saline locked 10/27/20 1500  Dressing Type Transparent 10/27/20 1500  Dressing Status Clean;Dry;Intact 10/27/20 1500  Antimicrobial disc in place? Yes 10/27/20 1500  Safety Lock Not Applicable 58/85/02 7741  Line Care Connections checked and tightened 10/27/20 1500  Dressing Change Due 11/03/20 10/27/20 1500       Jeanice Lim M 10/27/2020, 3:00 PM

## 2020-10-27 NOTE — Progress Notes (Signed)
Patient Demographics:    [name], is a 70 y.o. male, DOB - [birth year], BIP:[id number]  Admit date - 10/25/2020   Admitting Physician Bernadette Hoit, DO  Outpatient Primary MD for the patient is Leeanne Rio, MD  LOS - 2   Chief Complaint  Patient presents with  . Foot Pain  . 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 male with medical history significant for hypertension, hyperlipidemia, T2DM, atrial flutter on Eliquis, history of stroke with residual left hemiparesis, CAD admitted with sepsis due to infected left foot/osteomyelitis   A/p 1) Sepsis --due to Infected Lt Foot/Ostepmyleitis---POA--- patient met sepsis criteria on admission--- with tachycardia, tachypnea, leukocytosis and clinical and imaging finding left foot infection/osteomyelitis -Continue vancomycin and cefepime pending cultures -Awaiting Surgical consult for possible debridement  2)Paroxysmal A. fib/A flutter--- with RVR, now on IV amiodarone drip plan to transition back to oral amiodarone drip in a.m. -Currently on therapeutic Lovenox plan to transition back to apixaban in a.m.  3)CAD--status post prior CABG, no ACS type symptoms continue Plavix and  Lipitor  4)HTN--stable, continue lisinopril  5)DM2- .  A1c 6.9 reflecting excellent diabetic control PTA -Use Novolog/Humalog Sliding scale insulin with Accu-Cheks/Fingersticks as ordered   6)Prior history of stroke--no acute concerns at this time okay to continue atorvastatin and clopidogrel  7)GERD--- continue Protonix  8)BPH--stable,  continue Proscar  9)Hypokalemia/Hypomagnesemia--replace and recheck  10) chronic anemia--hemoglobin currently above 11 which is close to prior baseline  -Monitor closely for bleeding signs while on anticoagulation  11)Dementia with behavioral disturbance--patient's wife reports episodes of confusion, forgetfulness, cognitive deficits and hallucinations from time to time at home PTA--- May use lorazepam as needed -Okay to start Aricept and Namenda  12) presumed community-acquired pneumonia--chest imaging studies suggest bilateral pneumonia--- continue IV cefepime  13) generalized weakness/ambulatory dysfunction--- PT eval appreciated recommends SNF rehab  Disposition/Need for in-Hospital Stay- patient unable to be discharged at this time due to sepsis due to infected left foot/osteomyelitis requiring IV antibiotics  Status is: Inpatient  Remains inpatient appropriate because:Please see above   Disposition: The patient is from: Home              Anticipated d/c is to: SNF              Anticipated d/c date is: 2 days              Patient currently is not medically stable to d/c. Barriers: Not Clinically Stable-   Code Status : -  Code  Status: Full Code   Family Communication: Updated wife by phone  Consults  :  Gen surg  DVT Prophylaxis  :   - SCDs  SCDs Start: 10/25/20 2246 Lovenox   Lab Results  Component Value Date   PLT 281 10/26/2020    Inpatient Medications  Scheduled Meds: . baclofen  5 mg Oral TID  . Chlorhexidine Gluconate Cloth  6 each Topical Daily  . clopidogrel  75 mg Oral Daily  . enoxaparin (LOVENOX) injection   110 mg Subcutaneous BID  . feeding supplement (GLUCERNA SHAKE)  237 mL Oral TID BM  . finasteride  5 mg Oral Q1200  . insulin aspart  0-15 Units Subcutaneous TID WC  . insulin aspart  0-5 Units Subcutaneous QHS  . lisinopril  2.5 mg Oral Daily  . pantoprazole  40 mg Oral Daily  . sodium chloride flush  10-40 mL Intracatheter Q12H   Continuous Infusions: . amiodarone 30 mg/hr (10/27/20 1058)  . ceFEPime (MAXIPIME) IV 2 g (10/27/20 1501)  . vancomycin 200 mL/hr at 10/27/20 1058   PRN Meds:.acetaminophen, LORazepam, ondansetron (ZOFRAN) IV, oxyCODONE-acetaminophen, sodium chloride flush    Anti-infectives (From admission, onward)   Start     Dose/Rate Route Frequency Ordered Stop   10/26/20 1000  vancomycin (VANCOCIN) IVPB 1000 mg/200 mL premix        1,000 mg 200 mL/hr over 60 Minutes Intravenous Every 12 hours 10/25/20 2037     10/25/20 2200  ceFEPIme (MAXIPIME) 2 g in sodium chloride 0.9 % 100 mL IVPB        2 g 200 mL/hr over 30 Minutes Intravenous Every 8 hours 10/25/20 2037     10/25/20 2030  vancomycin (VANCOREADY) IVPB 2000 mg/400 mL        2,000 mg 200 mL/hr over 120 Minutes Intravenous NOW 10/25/20 2027 10/25/20 2248   10/25/20 1930  vancomycin (VANCOCIN) IVPB 1000 mg/200 mL premix  Status:  Discontinued        1,000 mg 200 mL/hr over 60 Minutes Intravenous  Once 10/25/20 1928 10/25/20 2136   10/25/20 1930  cefTRIAXone (ROCEPHIN) 2 g in sodium chloride 0.9 % 100 mL IVPB        2 g 200 mL/hr over 30 Minutes Intravenous  Once 10/25/20 1928 10/25/20 2040       Objective:   Vitals:   10/27/20 1400 10/27/20 1500 10/27/20 1558 10/27/20 1600  BP: 140/60 (!) 113/50  135/62  Pulse: (!) 106 (!) 57 (!) 108 (!) 106  Resp: (!) 34     Temp:   (!) 97 F (36.1 C)   TempSrc:   Axillary   SpO2: 97% 98% 100% 93%  Weight:      Height:        Wt Readings from Last 3 Encounters:  10/27/20 110.3 kg  05/31/20 114.2 kg  03/25/20 112.8 kg     Intake/Output Summary (Last 24  hours) at 10/27/2020 1750 Last data filed at 10/27/2020 1058 Gross per 24 hour  Intake 1184.49 ml  Output --  Net 1184.49 ml   Physical Exam  Gen:- Awake Alert, obese, in no acute distress HEENT:- [place].AT, No sclera icterus Neck-Supple Neck,No JVD,.  Lungs-diminished in bases, no wheezing CV- S1, S2 normal, irregularly irregular  abd-  +ve B.Sounds, Abd Soft, No tenderness,    Extremity/Skin:- + edema, pedal pulses present  Psych-patient with baseline cognitive and memory deficits, episodes of hallucinations and confusion from time to time which appears not to be new  neuro-generalized weakness, no new focal deficits, no tremors MSK--- left foot ulcerations, left heel necrotic eschar-please see photos in epic   Data Review:   Micro Results Recent Results (from the past 240 hour(s))  Blood culture (routine single)     Status: None (Preliminary result)   Collection Time: 10/25/20  6:35 PM   Specimen: Right Antecubital; Blood  Result Value Ref Range Status   Specimen Description   Final    RIGHT ANTECUBITAL BOTTLES DRAWN AEROBIC AND ANAEROBIC Blood Culture adequate volume   Special Requests NONE  Final   Culture   Final    NO GROWTH 2 DAYS Performed at [facility], [address] [place], [place] [address]    Report Status PENDING  Incomplete  Resp Panel by RT-PCR (Flu A&B, Covid) Nasopharyngeal Swab     Status: None   Collection Time: 10/25/20  6:37 PM   Specimen: Nasopharyngeal Swab; Nasopharyngeal(NP) swabs in vial transport medium  Result Value Ref Range Status   SARS Coronavirus 2 by RT PCR NEGATIVE NEGATIVE Final    Comment: (NOTE) SARS-CoV-2 target nucleic acids are NOT DETECTED.  The SARS-CoV-2 RNA is generally detectable in upper respiratory specimens during the acute phase of infection. The lowest concentration of SARS-CoV-2 viral copies this assay can detect is 138 copies/mL. A negative result does not preclude SARS-Cov-2 infection and should not be used as the  sole basis for treatment or other patient management decisions. A negative result may occur with  improper specimen collection/handling, submission of specimen other than nasopharyngeal swab, presence of viral mutation(s) within the areas targeted by this assay, and inadequate number of viral copies(<138 copies/mL). A negative result must be combined with clinical observations, patient history, and epidemiological information. The expected result is Negative.  Fact Sheet for Patients:  EntrepreneurPulse.com.au  Fact Sheet for Healthcare Providers:  IncredibleEmployment.be  This test is no t yet approved or cleared by the [place] FDA and  has been authorized for detection and/or diagnosis of SARS-CoV-2 by FDA under an Emergency Use Authorization (EUA). This EUA will remain  in effect (meaning this test can be used) for the duration of the COVID-19 declaration under Section 564(b)(1) of the Act, 21 U.S.C.section 360bbb-3(b)(1), unless the authorization is terminated  or revoked sooner.       Influenza A by PCR NEGATIVE NEGATIVE Final   Influenza B by PCR NEGATIVE NEGATIVE Final    Comment: (NOTE) The Xpert Xpress SARS-CoV-2/FLU/RSV plus assay is intended as an aid in the diagnosis of influenza from Nasopharyngeal swab specimens and should not be used as a sole basis for treatment. Nasal washings and aspirates are unacceptable for Xpert Xpress SARS-CoV-2/FLU/RSV testing.  Fact Sheet for Patients: EntrepreneurPulse.com.au  Fact Sheet for Healthcare Providers: IncredibleEmployment.be  This test is not yet approved or cleared by the [place] FDA and has been authorized for detection and/or diagnosis of SARS-CoV-2 by FDA under an Emergency Use Authorization (EUA). This EUA will remain in effect (meaning this test can be used) for the duration of the COVID-19 declaration under Section 564(b)(1) of the  Act, 21 U.S.C. section 360bbb-3(b)(1), unless the authorization is terminated or revoked.  Performed at [facility], [address] [place], [place] [address]   Urine culture     Status: Abnormal (Preliminary result)   Collection Time: 10/25/20 10:10 PM   Specimen: In/Out Cath Urine  Result Value Ref Range Status   Specimen Description   Final    IN/OUT CATH URINE Performed at [facility]  [address] [place], [place] [address]    Special Requests   Final    NONE Performed at [facility], [address] [place], [place] [address]    Culture (A)  Final    40,000 COLONIES/mL ENTEROBACTER AEROGENES SUSCEPTIBILITIES TO FOLLOW Performed at [facility], [place] [address], [place], [place] [address]    Report Status PENDING  Incomplete  MRSA PCR Screening     Status: None   Collection Time: 10/26/20  6:42 PM   Specimen: Nasal Mucosa; Nasopharyngeal  Result Value Ref Range Status   MRSA by PCR NEGATIVE NEGATIVE Final    Comment:        The GeneXpert MRSA Assay (FDA approved for NASAL specimens only), is one component of a comprehensive MRSA colonization surveillance program. It is not intended to diagnose MRSA infection nor to guide or monitor treatment for MRSA infections. Performed at [facility], [address], [place],  [address]     Radiology Reports DG Chest 1 View  Result Date: 10/26/2020 CLINICAL DATA:  Hypertension EXAM: CHEST  1 VIEW COMPARISON:  September 16, 2018 FINDINGS: There are chronic bronchitic changes at the lung bases bilaterally. There is cardiomegaly. The patient is status post prior median sternotomy. There are prominent interstitial lung markings at the lung bases suggestive of interstitial edema. There is no pneumothorax or large pleural effusion. No acute osseous abnormality. IMPRESSION: 1. Cardiomegaly with mild interstitial edema. 2. Chronic bronchitic changes at the lung bases bilaterally. Electronically Signed   By: Constance Holster M.D.   On: 10/26/2020 04:49   DG ABD ACUTE 2+V W 1V CHEST  Result Date: 10/27/2020 CLINICAL DATA:  Emesis. EXAM: DG ABDOMEN ACUTE WITH 1 VIEW CHEST COMPARISON:  02/09/2013 and chest x-ray 10/26/2020 FINDINGS: Patient combative as best images obtained. Sternotomy wires unchanged. Right-sided PICC line has tip over the SVC. Lungs are adequately inflated demonstrate patchy bilateral airspace opacification which may be due to multifocal infection. No definite effusion. Mild stable cardiomegaly. Abdominopelvic images demonstrate air throughout the colon. There multiple air-filled nondilated small bowel loops present. No free peritoneal air. Degenerative changes of the spine and hips. There are surgical clips over the pelvis. IMPRESSION: 1. Nonspecific, nonobstructive bowel gas pattern. 2. Patchy bilateral airspace process which may be due to multifocal infection. Electronically Signed   By: Marin Olp M.D.   On: 10/27/2020 16:00   DG Foot Complete Left  Result Date: 10/25/2020 CLINICAL DATA:  Ulcer to the great toe and heel EXAM: LEFT FOOT - COMPLETE 3+ VIEW COMPARISON:  05/21/2015 FINDINGS: No acute displaced fracture or malalignment is seen. Large ulcer on the medial side of the first digit. Cortical erosive change on the medial side of first distal phalanx and at the head of the first proximal phalanx suspicious for osteomyelitis. Chronic fracture deformity first proximal phalanx. Sclerosis and mild deformity at the necks of the fourth and fifth metatarsals, suspicious for subacute to developing chronic fractures. No erosive change at the calcaneus. No soft tissue emphysema. Vascular calcifications. IMPRESSION: 1. Cortical erosive change on the medial side of the first distal phalanx and head of the first proximal phalanx suspicious for osteomyelitis. Large overlying ulcer medial side first digit 2. Probable subacute to developing chronic fractures involving the necks of the fourth and fifth  metatarsals. Electronically Signed   By: Donavan Foil M.D.   On: 10/25/2020 19:11   [place] EKG SITE RITE  Result Date: 10/27/2020 If Site Rite image not attached, placement could  not be confirmed due to current cardiac rhythm.    CBC Recent Labs  Lab 10/25/20 1835 10/26/20 0431  WBC 15.2* 12.9*  HGB 11.3* 11.5*  HCT 35.0* 36.9*  PLT 286 281  MCV 95.4 98.1  MCH 30.8 30.6  MCHC 32.3 31.2  RDW 15.6* 15.7*  LYMPHSABS 1.7  --   MONOABS 1.0  --   EOSABS 0.1  --   BASOSABS 0.1  --     Chemistries  Recent Labs  Lab 10/25/20 1835 10/26/20 0431  NA 140 141  K 2.9* 2.9*  CL 103 103  CO2 26 20*  GLUCOSE 161* 193*  BUN 14 14  CREATININE 0.96 1.09  CALCIUM 8.4* 8.2*  MG  --  1.4*  AST 23 34  ALT 20 21  ALKPHOS 107 142*  BILITOT 0.7 0.9   ------------------------------------------------------------------------------------------------------------------ No results for input(s): CHOL, HDL, LDLCALC, TRIG, CHOLHDL, LDLDIRECT in the last 72 hours.  Lab Results  Component Value Date   HGBA1C 6.9 (H) 10/25/2020   ------------------------------------------------------------------------------------------------------------------ No results for input(s): TSH, T4TOTAL, T3FREE, THYROIDAB in the last 72 hours.  Invalid input(s): FREET3 ------------------------------------------------------------------------------------------------------------------ No results for input(s): VITAMINB12, FOLATE, FERRITIN, TIBC, IRON, RETICCTPCT in the last 72 hours.  Coagulation profile Recent Labs  Lab 10/25/20 1835 10/26/20 0431  INR 1.2 1.3*    No results for input(s): DDIMER in the last 72 hours.  Cardiac Enzymes No results for input(s): CKMB, TROPONINI, MYOGLOBIN in the last 168 hours.  Invalid input(s): CK ------------------------------------------------------------------------------------------------------------------ No results found for: BNP  Roxan Hockey M.D on 10/27/2020 at 5:50  PM  Go to [email or web address] - for contact info  [facility] - Office  [phone]

## 2020-10-27 NOTE — Progress Notes (Signed)
Patient has new onset of delirium. Patient is disoriented times four. Patient hallucinating, skin is clammy, and was vomiting. MD notified. Zofran and Ativan ordered and given. Patient is resting quietly. Will continue to monitor.
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
## 2020-10-28 ENCOUNTER — Inpatient Hospital Stay (HOSPITAL_COMMUNITY): Payer: Medicare Other

## 2020-10-28 DIAGNOSIS — A419 Sepsis, unspecified organism: Secondary | ICD-10-CM | POA: Diagnosis not present

## 2020-10-28 DIAGNOSIS — I4892 Unspecified atrial flutter: Secondary | ICD-10-CM | POA: Diagnosis not present

## 2020-10-28 DIAGNOSIS — I739 Peripheral vascular disease, unspecified: Secondary | ICD-10-CM

## 2020-10-28 DIAGNOSIS — M86172 Other acute osteomyelitis, left ankle and foot: Secondary | ICD-10-CM | POA: Diagnosis not present

## 2020-10-28 LAB — VANCOMYCIN, TROUGH: Vancomycin Tr: 26 ug/mL (ref 15–20)

## 2020-10-28 LAB — BASIC METABOLIC PANEL
Anion gap: 10 (ref 5–15)
BUN: 27 mg/dL — ABNORMAL HIGH (ref 8–23)
CO2: 22 mmol/L (ref 22–32)
Calcium: 8.2 mg/dL — ABNORMAL LOW (ref 8.9–10.3)
Chloride: 105 mmol/L (ref 98–111)
Creatinine, Ser: 1.18 mg/dL (ref 0.61–1.24)
GFR, Estimated: >60 mL/min (ref >60)
Glucose, Bld: 242 mg/dL — ABNORMAL HIGH (ref 70–99)
Potassium: 4.4 mmol/L (ref 3.5–5.1)
Sodium: 137 mmol/L (ref 135–145)

## 2020-10-28 LAB — GLUCOSE, CAPILLARY
Glucose-Capillary: 256 mg/dL — ABNORMAL HIGH (ref 70–99)
Glucose-Capillary: 196 mg/dL — ABNORMAL HIGH (ref 70–99)
Glucose-Capillary: 210 mg/dL — ABNORMAL HIGH (ref 70–99)
Glucose-Capillary: 231 mg/dL — ABNORMAL HIGH (ref 70–99)

## 2020-10-28 LAB — CBC
HCT: 35.2 % — ABNORMAL LOW (ref 39.0–52.0)
Hemoglobin: 11.2 g/dL — ABNORMAL LOW (ref 13.0–17.0)
MCH: 30.2 pg (ref 26.0–34.0)
MCHC: 31.8 g/dL (ref 30.0–36.0)
MCV: 94.9 fL (ref 80.0–100.0)
Platelets: 289 10*3/uL (ref 150–400)
RBC: 3.71 MIL/uL — ABNORMAL LOW (ref 4.22–5.81)
RDW: 15.8 % — ABNORMAL HIGH (ref 11.5–15.5)
WBC: 26.8 10*3/uL — ABNORMAL HIGH (ref 4.0–10.5)
nRBC: 0.2 % (ref 0.0–0.2)

## 2020-10-28 LAB — URINE CULTURE: Culture: 40000 — AB

## 2020-10-28 MED ORDER — METRONIDAZOLE IN NACL 5-0.79 MG/ML-% IV SOLN
500.0000 mg | Freq: Three times a day (TID) | INTRAVENOUS | Status: DC
  Administered 2020-10-28 – 2020-11-03 (×19): 500 mg via INTRAVENOUS
  Filled 2020-10-28 (×19): qty 100

## 2020-10-28 MED ORDER — IOHEXOL 350 MG/ML SOLN
150.0000 mL | Freq: Once | INTRAVENOUS | Status: AC | PRN
  Administered 2020-10-28: 125 mL via INTRAVENOUS

## 2020-10-28 MED ORDER — ATORVASTATIN CALCIUM 40 MG PO TABS
40.0000 mg | ORAL_TABLET | Freq: Every day | ORAL | Status: DC
  Administered 2020-10-30 – 2020-11-29 (×30): 40 mg via ORAL
  Filled 2020-10-28 (×30): qty 1

## 2020-10-28 MED ORDER — METOPROLOL TARTRATE 25 MG PO TABS
25.0000 mg | ORAL_TABLET | Freq: Two times a day (BID) | ORAL | Status: DC
  Administered 2020-10-28 – 2020-10-30 (×2): 25 mg via ORAL
  Filled 2020-10-28 (×2): qty 1

## 2020-10-28 MED ORDER — VANCOMYCIN HCL IN DEXTROSE 1-5 GM/200ML-% IV SOLN
1000.0000 mg | Freq: Two times a day (BID) | INTRAVENOUS | Status: DC
  Administered 2020-10-28 – 2020-10-29 (×2): 1000 mg via INTRAVENOUS
  Filled 2020-10-28: qty 200

## 2020-10-28 NOTE — Progress Notes (Signed)
Pt out with [place]

## 2020-10-28 NOTE — Progress Notes (Signed)
[facility] notified

## 2020-10-28 NOTE — Progress Notes (Signed)
Inpatient Diabetes Program Recommendations  AACE/ADA: New Consensus Statement on Inpatient Glycemic Control   Target Ranges:  Prepandial:   less than 140 mg/dL      Peak postprandial:   less than 180 mg/dL (1-2 hours)      Critically ill patients:  140 - 180 mg/dL   Results for GLENMORE, KARL (MRN [id number]) as of 10/28/2020 12:13  Ref. Range 10/27/2020 07:33 10/27/2020 11:36 10/27/2020 15:57 10/27/2020 23:54 10/28/2020 08:09 10/28/2020 11:35  Glucose-Capillary Latest Ref Range: 70 - 99 mg/dL 217 (H) 249 (H) 212 (H) 189 (H) 256 (H) 196 (H)   Review of Glycemic Control  Diabetes history: DM2 Outpatient Diabetes medications: Lantus 40 units daily, Metformin 1000 mg BID Current orders for Inpatient glycemic control: Novolog 0-15 units TID with meals, Novolog 0-5 units QHS  Inpatient Diabetes Program Recommendations:    Insulin: Please consider ordering Lantus 10 units daily.  Thanks, Barnie Alderman, RN, MSN, CDE Diabetes Coordinator Inpatient Diabetes Program [phone] (Team Pager from 8am to 5pm)

## 2020-10-28 NOTE — TOC Initial Note (Signed)
Transition of Care [facility]) - Initial/Assessment Note    Patient Details  Name: [name] MRN: [id number] Date of Birth: 08/17/1951  Transition of Care [facility]) CM/SW Contact:    Shade Flood, LCSW Phone Number: 10/28/2020, 11:43 AM  Clinical Narrative:                  Pt admitted from home. PT recommending SNF at dc. Unable to reach pt by phone in his room. Spoke with pt's wife by phone to assess. Discussed PT recommendations and pt's wife states that she thinks pt will need rehab and that they are agreeable to referrals. Discussed Cms providers and will refer as requested.  Per MD, anticipated dc date is at least 2 days. Will wait to notify insurance until dc timeframe more clear.  Expected Discharge Plan: Skilled Nursing Facility Barriers to Discharge: Continued Medical Work up   Patient Goals and CMS Choice Patient states their goals for this hospitalization and ongoing recovery are:: rehab CMS Medicare.gov Compare Post Acute Care list provided to:: Patient Represenative (must comment) Choice offered to / list presented to : Spouse  Expected Discharge Plan and Services Expected Discharge Plan: [place] In-house Referral: Clinical Social Work   Post Acute Care Choice: [place] Living arrangements for the past 2 months: [place]                                      Prior Living Arrangements/Services Living arrangements for the past 2 months: Single Family Home Lives with:: Spouse Patient language and need for interpreter reviewed:: Yes Do you feel safe going back to the place where you live?: Yes      Need for Family Participation in Patient Care: Yes (Comment) Care giver support system in place?: Yes (comment)   Criminal Activity/Legal Involvement Pertinent to Current Situation/Hospitalization: No - Comment as needed  Activities of Daily Living Home Assistive Devices/Equipment: Gilford Rile (specify type) ADL Screening  (condition at time of admission) Patient's cognitive ability adequate to safely complete daily activities?: Yes Is the patient deaf or have difficulty hearing?: No Does the patient have difficulty seeing, even when wearing glasses/contacts?: No Does the patient have difficulty concentrating, remembering, or making decisions?: No Patient able to express need for assistance with ADLs?: Yes Does the patient have difficulty dressing or bathing?: No Independently performs ADLs?: No Communication: Independent Dressing (OT): Independent Grooming: Needs assistance Is this a change from baseline?: Pre-admission baseline Feeding: Independent Bathing: Needs assistance Is this a change from baseline?: Pre-admission baseline Toileting: Independent In/Out Bed: Independent Walks in Home: Independent with device (comment) Does the patient have difficulty walking or climbing stairs?: Yes Weakness of Legs: Both Weakness of Arms/Hands: None  Permission Sought/Granted Permission sought to share information with : Chartered certified accountant granted to share information with : Yes, Verbal Permission Granted     Permission granted to share info w AGENCY: snfs        Emotional Assessment       Orientation: : Oriented to Self,Oriented to Place,Oriented to  Time,Oriented to Situation Alcohol / Substance Use: Not Applicable Psych Involvement: No (comment)  Admission diagnosis:  Shortness of breath [R06.02] Acute osteomyelitis of toe, left (HCC) [M86.172] Osteomyelitis of left foot, unspecified type [facility]) [M86.9] Patient Active Problem List   Diagnosis Date Noted  . SIRS (systemic inflammatory response syndrome) ([place]) 10/26/2020  . Sepsis --due to Infected  Lt Foot/Ostepmyleitis---POA 10/26/2020  . Acute osteomyelitis of toe, left ([place]) 10/25/2020  . Diabetic ulcer of left heel ([place]) 10/25/2020  . Fracture of left toe 10/25/2020  . Leukocytosis 10/25/2020  . Hypokalemia 10/25/2020   . Hypoalbuminemia 10/25/2020  . Hyperglycemia due to diabetes mellitus ([place]) 10/25/2020  . History of stroke 10/25/2020  . Abdominal pain, epigastric 02/09/2017  . Morbid obesity ([place]) 09/17/2016  . Sedentary lifestyle 11/20/2015  . Type 2 diabetes mellitus with vascular disease ([place]) 07/25/2015  . Bilateral carotid artery disease ([place]) 05/01/2015  . Atherosclerosis of artery of extremity with ulceration ([place]) 04/08/2015  . Lower extremity edema 02/11/2015  . Tobacco use disorder 07/16/2014  . Chest pain 06/30/2014  . OSA on CPAP 06/30/2014  . Aftercare following surgery of the circulatory system, [place] 12/27/2013  . Chronic pain syndrome 11/03/2013  . Contracture of muscle of left upper arm 10/10/2013  . Foot fracture 10/10/2013  . Generalized OA 10/10/2013  . Anemia, iron deficiency 09/26/2013  . Insomnia 09/26/2013  . Stasis dermatitis 07/31/2013  . Peripheral autonomic neuropathy due to diabetes mellitus ([place]) 07/31/2013  . RLS (restless legs syndrome) 07/31/2013  . Allergic rhinitis 07/28/2013  . CAD, multiple vessel, 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 06/21/2013  . Essential hypertension 06/21/2013  . GERD (gastroesophageal reflux disease) 06/21/2013  . BPH (benign prostatic hyperplasia) 06/21/2013  . Cardiomyopathy, ischemic 02/22/2013  . Mixed hyperlipidemia 02/22/2013  . Atrial flutter ([place]) 01/02/2013  . Acute myocardial infarction, subendocardial infarction, initial episode of care ([place]) 01/02/2013  . Occlusion and stenosis of carotid artery without mention of cerebral infarction 10/27/2012  . CVA (cerebral infarction) 06/03/2012   PCP:  Leeanne Rio, MD Pharmacy:   [facility], [place] [place] [address] Phone: [phone] Fax: [phone]     Social Determinants of Health (SDOH) Interventions    Readmission Risk Interventions No flowsheet data found.

## 2020-10-28 NOTE — NC FL2 (Signed)
[place] LEVEL OF CARE SCREENING TOOL     IDENTIFICATION  Patient Name: [name] Birthdate: 02-08-1951 Sex: male Admission Date (Current Location): 10/25/2020  [facility] and [place] Number:  [employer] and Address:  [place] [address] [place]      Provider Number: [id number]  Attending Physician Name and Address:  Roxan Hockey, MD  Relative Name and Phone Number:       Current Level of Care: Hospital Recommended Level of Care: [place] Prior Approval Number:    Date Approved/Denied:   PASRR Number: [id number]  Discharge Plan: SNF    Current Diagnoses: Patient Active Problem List   Diagnosis Date Noted  . SIRS (systemic inflammatory response syndrome) ([place]) 10/26/2020  . Sepsis --due to Infected Lt Foot/Ostepmyleitis---POA 10/26/2020  . Acute osteomyelitis of toe, left ([place]) 10/25/2020  . Diabetic ulcer of left heel ([place]) 10/25/2020  . Fracture of left toe 10/25/2020  . Leukocytosis 10/25/2020  . Hypokalemia 10/25/2020  . Hypoalbuminemia 10/25/2020  . Hyperglycemia due to diabetes mellitus ([place]) 10/25/2020  . History of stroke 10/25/2020  . Abdominal pain, epigastric 02/09/2017  . Morbid obesity ([place]) 09/17/2016  . Sedentary lifestyle 11/20/2015  . Type 2 diabetes mellitus with vascular disease ([place]) 07/25/2015  . Bilateral carotid artery disease ([place]) 05/01/2015  . Atherosclerosis of artery of extremity with ulceration ([place]) 04/08/2015  . Lower extremity edema 02/11/2015  . Tobacco use disorder 07/16/2014  . Chest pain 06/30/2014  . OSA on CPAP 06/30/2014  . Aftercare following surgery of the circulatory system, [place] 12/27/2013  . Chronic pain syndrome 11/03/2013  . Contracture of muscle of left upper arm 10/10/2013  . Foot fracture 10/10/2013  . Generalized OA 10/10/2013  . Anemia, iron deficiency 09/26/2013  . Insomnia 09/26/2013  . Stasis dermatitis 07/31/2013  .  Peripheral autonomic neuropathy due to diabetes mellitus ([place]) 07/31/2013  . RLS (restless legs syndrome) 07/31/2013  . Allergic rhinitis 07/28/2013  . CAD, multiple vessel, 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 06/21/2013  . Essential hypertension 06/21/2013  . GERD (gastroesophageal reflux disease) 06/21/2013  . BPH (benign prostatic hyperplasia) 06/21/2013  . Cardiomyopathy, ischemic 02/22/2013  . Mixed hyperlipidemia 02/22/2013  . Atrial flutter ([place]) 01/02/2013  . Acute myocardial infarction, subendocardial infarction, initial episode of care ([place]) 01/02/2013  . Occlusion and stenosis of carotid artery without mention of cerebral infarction 10/27/2012  . CVA (cerebral infarction) 06/03/2012    Orientation RESPIRATION BLADDER Height & Weight     Self,Time,Situation,Place  O2 (see dc summary) Incontinent Weight: 243 lb 2.7 oz (110.3 kg) Height:  5\' 7"  (170.2 cm)  BEHAVIORAL SYMPTOMS/MOOD NEUROLOGICAL BOWEL NUTRITION STATUS      Incontinent Diet (see dc summary)  AMBULATORY STATUS COMMUNICATION OF NEEDS Skin   Extensive Assist   PU Stage and Appropriate Care (see dc summary)                       Personal Care Assistance Level of Assistance  Bathing,Feeding,Dressing Bathing Assistance: Limited assistance Feeding assistance: Independent Dressing Assistance: Limited assistance     Functional Limitations Info  Sight,Hearing,Speech Sight Info: Adequate Hearing Info: Adequate Speech Info: Adequate    SPECIAL CARE FACTORS FREQUENCY  PT (By licensed PT),OT (By licensed OT)     PT Frequency: 5x week OT Frequency: 3x week            Contractures Contractures Info: Present (LUE)    Additional Factors Info  Code  Status,Allergies Code Status Info: Full Allergies Info: Flexeril, Tamsulosin Hcl, Tramadol, Robaxin, Aspirin, Morphine and related           Current Medications (10/28/2020):  This is the current hospital active medication list Current Facility-Administered Medications   Medication Dose Route Frequency Provider Last Rate Last Admin  . acetaminophen (TYLENOL) tablet 650 mg  650 mg Oral Q6H PRN Emokpae, Courage, MD      . amiodarone (NEXTERONE PREMIX) 360-4.14 MG/200ML-% (1.8 mg/mL) IV infusion  30 mg/hr Intravenous Continuous Adefeso, Oladapo, DO 16.67 mL/hr at 10/27/20 2258 30 mg/hr at 10/27/20 2258  . baclofen (LIORESAL) tablet 5 mg  5 mg Oral TID Adefeso, Oladapo, DO   5 mg at 10/27/20 1017  . ceFEPIme (MAXIPIME) 2 g in sodium chloride 0.9 % 100 mL IVPB  2 g Intravenous Q8H Robertson, Crystal S, RPH 200 mL/hr at 10/28/20 0703 2 g at 10/28/20 0703  . Chlorhexidine Gluconate Cloth 2 % PADS 6 each  6 each Topical Daily Roxan Hockey, MD   6 each at 10/28/20 0849  . clopidogrel (PLAVIX) tablet 75 mg  75 mg Oral Daily Adefeso, Oladapo, DO   75 mg at 10/27/20 1017  . donepezil (ARICEPT) tablet 5 mg  5 mg Oral QHS Emokpae, Courage, MD      . enoxaparin (LOVENOX) injection 110 mg  110 mg Subcutaneous BID Coffee, Donna Christen, RPH   110 mg at 10/28/20 0844  . feeding supplement (GLUCERNA SHAKE) (GLUCERNA SHAKE) liquid 237 mL  237 mL Oral TID BM Adefeso, Oladapo, DO   237 mL at 10/27/20 0854  . finasteride (PROSCAR) tablet 5 mg  5 mg Oral Q1200 Adefeso, Oladapo, DO   5 mg at 10/27/20 1017  . insulin aspart (novoLOG) injection 0-15 Units  0-15 Units Subcutaneous TID WC Adefeso, Oladapo, DO   3 Units at 10/28/20 1149  . insulin aspart (novoLOG) injection 0-5 Units  0-5 Units Subcutaneous QHS Adefeso, Oladapo, DO   2 Units at 10/26/20 2105  . lisinopril (ZESTRIL) tablet 2.5 mg  2.5 mg Oral Daily Adefeso, Oladapo, DO   2.5 mg at 10/27/20 1017  . LORazepam (ATIVAN) injection 1 mg  1 mg Intravenous Q6H PRN Roxan Hockey, MD   1 mg at 10/27/20 1822  . memantine (NAMENDA) tablet 5 mg  5 mg Oral BID Emokpae, Courage, MD      . metroNIDAZOLE (FLAGYL) IVPB 500 mg  500 mg Intravenous Q8H Emokpae, Courage, MD 100 mL/hr at 10/28/20 0839 500 mg at 10/28/20 0839  . ondansetron (ZOFRAN)  injection 4 mg  4 mg Intravenous Q6H PRN Roxan Hockey, MD   4 mg at 10/27/20 1217  . oxyCODONE-acetaminophen (PERCOCET/ROXICET) 5-325 MG per tablet 1 tablet  1 tablet Oral Q4H PRN Adefeso, Oladapo, DO   1 tablet at 10/27/20 1017  . pantoprazole (PROTONIX) EC tablet 40 mg  40 mg Oral Daily Adefeso, Oladapo, DO   40 mg at 10/27/20 1018  . sodium chloride flush (NS) 0.9 % injection 10-40 mL  10-40 mL Intracatheter Q12H Emokpae, Courage, MD   20 mL at 10/28/20 0849  . sodium chloride flush (NS) 0.9 % injection 10-40 mL  10-40 mL Intracatheter PRN Emokpae, Courage, MD      . vancomycin (VANCOCIN) IVPB 1000 mg/200 mL premix  1,000 mg Intravenous Q12H Karren Cobble, RPH 200 mL/hr at 10/27/20 2356 1,000 mg at 10/27/20 2356     Discharge Medications: Please see discharge summary for a list of discharge medications.  Relevant Imaging Results:  Relevant Lab Results:   Additional Information SSN: [place], [place]

## 2020-10-28 NOTE — Progress Notes (Signed)
Patient Demographics:    [name], is a 70 y.o. male, DOB - 23-Mar-1951, CXK:[id number]  Admit date - 10/25/2020   Admitting Physician Bernadette Hoit, DO  Outpatient Primary MD for the patient is Leeanne Rio, MD  LOS - 3   Chief Complaint  Patient presents with  . Foot Pain  . Foot Ulcer        Subjective:    [name] today has no fevers, no emesis,  No chest pain,  -episodes of confusion and disorientation---  Assessment  & Plan :    Principal Problem:   Sepsis --due to Infected Lt Foot/Ostepmyleitis---POA Active Problems:   Acute osteomyelitis of toe, left (HCC)   Diabetic ulcer of left heel (HCC)   PAD (peripheral artery disease) /SMA Stenosis/Rt and Lt SFA Stenosis/   Atrial flutter (HCC)   Mixed hyperlipidemia   CAD, multiple vessel, 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   Essential hypertension   GERD (gastroesophageal reflux disease)   BPH (benign prostatic hyperplasia)   Fracture of left toe   Leukocytosis   Hypokalemia   Hypoalbuminemia   Hyperglycemia due to diabetes mellitus ([place])   History of stroke   Brief Summary:- 70 y.o. male with medical history significant for hypertension, hyperlipidemia, T2DM, atrial flutter on Eliquis, history of stroke with residual left hemiparesis, CAD admitted with sepsis due to infected left foot/osteomyelitis - 10/28/20--CT Angio AO+ BiFEM ----shows SMA and SFA stenosis with Dry Gangrene---Dr. Ruta Hinds recommends Transfer to [facility] for Vascular Surgery consult.    A/p 1) Sepsis --due to Infected Lt Foot/Ostepmyleitis AND bilateral pneumonia and Enterobacter UTI ---POA---  -patient met sepsis criteria on admission--- with tachycardia, tachypnea, leukocytosis and clinical and imaging finding left foot infection/osteomyelitis -Continue vancomycin, flagyl and cefepime - Surgical consult for possible debridement appreciated, awaiting further  vascular surgery eval -WBC is up to 26.8K -Antibiotics: Vanco 1/14> Cefepime 1/14>> Flagyl 1/17>>    Cultures: 1/15 BCX: ngtd 1/16 Urine Cx: 40K CFU/ml, enterobacter--sensitive to Cefepime 1/15 MRSA PCR is negative  2)PAD-CT Angio AO+ BiFEM ----shows SMA and SFA stenosis with Dry Gangrene---Discussed with Dr. Ruta Hinds recommends Transfer to [facility] for Vascular Surgery consult.  -Continue Plavix and Lipitor for now  3)CAD--status post prior CABG, no ACS type symptoms continue Plavix and Lipitor -Echo pending  4)HTN--stable, start metoprolol 25 mg twice daily  5)DM2- .  A1c 6.9 reflecting excellent diabetic control PTA -Use Novolog/Humalog Sliding scale insulin with Accu-Cheks/Fingersticks as ordered   6)Prior history of stroke--no acute concerns at this time okay to continue atorvastatin and clopidogrel  7)GERD--- continue Protonix  8)BPH--stable,  continue Proscar  9)Hypokalemia/Hypomagnesemia--replace and recheck  10) chronic anemia--hemoglobin currently above 11 which is close to prior baseline  -Stool occult blood is negative this admission -Monitor closely for bleeding signs while on anticoagulation  11)Dementia with behavioral disturbance--patient's wife reports episodes of confusion, forgetfulness, cognitive deficits and hallucinations from time to time at home PTA--- May use lorazepam as needed -started Aricept and Namenda  12) presumed community-acquired pneumonia--chest imaging studies suggest bilateral pneumonia--- continue IV cefepime as above #1  13) generalized weakness/ambulatory dysfunction--- PT eval appreciated recommends SNF rehab  14)Paroxysmal A. fib/A flutter--- with RVR, now on IV amiodarone drip  -plan to transition back to oral amiodarone  post op -Currently on therapeutic Lovenox plan to transition back to apixaban postoperatively -Use metoprolol 25 mg twice daily for rate control okay to titrate up  15) Enterobacter aeruginosa UTI--- urine  culture from 10/27/2020 with Enterobacter sensitive to cefepime-continue IV cefepime as above in about 1  Disposition/Need for in-Hospital Stay- patient unable to be discharged at this time due to sepsis due to infected left foot/osteomyelitis requiring IV antibiotics  Status is: Inpatient  Remains inpatient appropriate because:Please see above   Disposition: The patient is from: Home              Anticipated d/c is to: SNF              Anticipated d/c date is: 2 days              Patient currently is not medically stable to d/c. Barriers: Not Clinically Stable-   Code Status : -  Code Status: Full Code   Family Communication: Updated wife by phone on 10/28/20  Consults  :  Gen surg/vascular surgery Dr. Ruta Hinds  DVT Prophylaxis  :   - SCDs  SCDs Start: 10/25/20 2246 Lovenox   Lab Results  Component Value Date   PLT 289 10/28/2020    Inpatient Medications  Scheduled Meds: . baclofen  5 mg Oral TID  . Chlorhexidine Gluconate Cloth  6 each Topical Daily  . clopidogrel  75 mg Oral Daily  . donepezil  5 mg Oral QHS  . enoxaparin (LOVENOX) injection  110 mg Subcutaneous BID  . feeding supplement (GLUCERNA SHAKE)  237 mL Oral TID BM  . finasteride  5 mg Oral Q1200  . insulin aspart  0-15 Units Subcutaneous TID WC  . insulin aspart  0-5 Units Subcutaneous QHS  . lisinopril  2.5 mg Oral Daily  . memantine  5 mg Oral BID  . pantoprazole  40 mg Oral Daily  . sodium chloride flush  10-40 mL Intracatheter Q12H   Continuous Infusions: . amiodarone 30 mg/hr (10/27/20 2258)  . ceFEPime (MAXIPIME) IV 2 g (10/28/20 1454)  . metronidazole 500 mg (10/28/20 1720)  . vancomycin 1,000 mg (10/28/20 1451)   PRN Meds:.acetaminophen, LORazepam, ondansetron (ZOFRAN) IV, oxyCODONE-acetaminophen, sodium chloride flush    Anti-infectives (From admission, onward)   Start     Dose/Rate Route Frequency Ordered Stop   10/28/20 1400  vancomycin (VANCOCIN) IVPB 1000 mg/200 mL premix         1,000 mg 200 mL/hr over 60 Minutes Intravenous Every 12 hours 10/28/20 1344     10/28/20 0900  metroNIDAZOLE (FLAGYL) IVPB 500 mg        500 mg 100 mL/hr over 60 Minutes Intravenous Every 8 hours 10/28/20 0808     10/26/20 1000  vancomycin (VANCOCIN) IVPB 1000 mg/200 mL premix  Status:  Discontinued        1,000 mg 200 mL/hr over 60 Minutes Intravenous Every 12 hours 10/25/20 2037 10/28/20 1344   10/25/20 2200  ceFEPIme (MAXIPIME) 2 g in sodium chloride 0.9 % 100 mL IVPB        2 g 200 mL/hr over 30 Minutes Intravenous Every 8 hours 10/25/20 2037     10/25/20 2030  vancomycin (VANCOREADY) IVPB 2000 mg/400 mL        2,000 mg 200 mL/hr over 120 Minutes Intravenous NOW 10/25/20 2027 10/25/20 2248   10/25/20 1930  vancomycin (VANCOCIN) IVPB 1000 mg/200 mL premix  Status:  Discontinued        1,000  mg 200 mL/hr over 60 Minutes Intravenous  Once 10/25/20 1928 10/25/20 2136   10/25/20 1930  cefTRIAXone (ROCEPHIN) 2 g in sodium chloride 0.9 % 100 mL IVPB        2 g 200 mL/hr over 30 Minutes Intravenous  Once 10/25/20 1928 10/25/20 2040       Objective:   Vitals:   10/28/20 1100 10/28/20 1132 10/28/20 1152 10/28/20 1612  BP: (!) 150/89     Pulse: (!) 59     Resp: (!) 21     Temp:  (!) 97.4 F (36.3 C)  97.8 F (36.6 C)  TempSrc:  Oral  Oral  SpO2: (!) 85%  94%   Weight:      Height:        Wt Readings from Last 3 Encounters:  10/27/20 110.3 kg  05/31/20 114.2 kg  03/25/20 112.8 kg    No intake or output data in the 24 hours ending 10/28/20 1826 Physical Exam  Gen:- Awake Alert, obese, in no acute distress HEENT:- [place].AT, No sclera icterus Neck-Supple Neck,No JVD,.  Lungs-diminished in bases, no wheezing CV- S1, S2 normal, irregularly irregular  abd-  +ve B.Sounds, Abd Soft, No tenderness,    Extremity/Skin:- + edema, pedal pulses present  Psych-patient with baseline cognitive and memory deficits, episodes of hallucinations and confusion from time to time which appears  not to be new  neuro-generalized weakness, no new focal deficits, no tremors MSK--- left foot ulcerations, left heel necrotic eschar-dry gangrene--please see photos in epic   Data Review:   Micro Results Recent Results (from the past 240 hour(s))  Blood culture (routine single)     Status: None (Preliminary result)   Collection Time: 10/25/20  6:35 PM   Specimen: Right Antecubital; Blood  Result Value Ref Range Status   Specimen Description   Final    RIGHT ANTECUBITAL BOTTLES DRAWN AEROBIC AND ANAEROBIC Blood Culture adequate volume   Special Requests NONE  Final   Culture   Final    NO GROWTH 3 DAYS Performed at [facility], [address], [place], [place] [address]    Report Status PENDING  Incomplete  Resp Panel by RT-PCR (Flu A&B, Covid) Nasopharyngeal Swab     Status: None   Collection Time: 10/25/20  6:37 PM   Specimen: Nasopharyngeal Swab; Nasopharyngeal(NP) swabs in vial transport medium  Result Value Ref Range Status   SARS Coronavirus 2 by RT PCR NEGATIVE NEGATIVE Final    Comment: (NOTE) SARS-CoV-2 target nucleic acids are NOT DETECTED.  The SARS-CoV-2 RNA is generally detectable in upper respiratory specimens during the acute phase of infection. The lowest concentration of SARS-CoV-2 viral copies this assay can detect is 138 copies/mL. A negative result does not preclude SARS-Cov-2 infection and should not be used as the sole basis for treatment or other patient management decisions. A negative result may occur with  improper specimen collection/handling, submission of specimen other than nasopharyngeal swab, presence of viral mutation(s) within the areas targeted by this assay, and inadequate number of viral copies(<138 copies/mL). A negative result must be combined with clinical observations, patient history, and epidemiological information. The expected result is Negative.  Fact Sheet for Patients:  EntrepreneurPulse.com.au  Fact Sheet for  Healthcare Providers:  IncredibleEmployment.be  This test is no t yet approved or cleared by the [place] FDA and  has been authorized for detection and/or diagnosis of SARS-CoV-2 by FDA under an Emergency Use Authorization (EUA). This EUA will remain  in effect (meaning  this test can be used) for the duration of the COVID-19 declaration under Section 564(b)(1) of the Act, 21 U.S.C.section 360bbb-3(b)(1), unless the authorization is terminated  or revoked sooner.       Influenza A by PCR NEGATIVE NEGATIVE Final   Influenza B by PCR NEGATIVE NEGATIVE Final    Comment: (NOTE) The Xpert Xpress SARS-CoV-2/FLU/RSV plus assay is intended as an aid in the diagnosis of influenza from Nasopharyngeal swab specimens and should not be used as a sole basis for treatment. Nasal washings and aspirates are unacceptable for Xpert Xpress SARS-CoV-2/FLU/RSV testing.  Fact Sheet for Patients: EntrepreneurPulse.com.au  Fact Sheet for Healthcare Providers: IncredibleEmployment.be  This test is not yet approved or cleared by the [place] FDA and has been authorized for detection and/or diagnosis of SARS-CoV-2 by FDA under an Emergency Use Authorization (EUA). This EUA will remain in effect (meaning this test can be used) for the duration of the COVID-19 declaration under Section 564(b)(1) of the Act, 21 U.S.C. section 360bbb-3(b)(1), unless the authorization is terminated or revoked.  Performed at [facility], [address] [place], [place] [address]   Urine culture     Status: Abnormal   Collection Time: 10/25/20 10:10 PM   Specimen: In/Out Cath Urine  Result Value Ref Range Status   Specimen Description   Final    IN/OUT CATH URINE Performed at [facility], [address] [place], [place] [address]    Special Requests   Final    NONE Performed at [facility] [address] [place], [place] [address]    Culture 40,000  COLONIES/mL ENTEROBACTER AEROGENES (A)  Final   Report Status 10/28/2020 FINAL  Final   Organism ID, Bacteria ENTEROBACTER AEROGENES (A)  Final      Susceptibility   Enterobacter aerogenes - MIC*    CEFAZOLIN >=64 RESISTANT Resistant     CEFEPIME <=0.12 SENSITIVE Sensitive     CEFTRIAXONE <=0.25 SENSITIVE Sensitive     CIPROFLOXACIN <=0.25 SENSITIVE Sensitive     GENTAMICIN <=1 SENSITIVE Sensitive     IMIPENEM 1 SENSITIVE Sensitive     NITROFURANTOIN 64 INTERMEDIATE Intermediate     TRIMETH/SULFA <=20 SENSITIVE Sensitive     PIP/TAZO <=4 SENSITIVE Sensitive     * 40,000 COLONIES/mL ENTEROBACTER AEROGENES  MRSA PCR Screening     Status: None   Collection Time: 10/26/20  6:42 PM   Specimen: Nasal Mucosa; Nasopharyngeal  Result Value Ref Range Status   MRSA by PCR NEGATIVE NEGATIVE Final    Comment:        The GeneXpert MRSA Assay (FDA approved for NASAL specimens only), is one component of a comprehensive MRSA colonization surveillance program. It is not intended to diagnose MRSA infection nor to guide or monitor treatment for MRSA infections. Performed at [facility], [address] [place], [place] [address]     Radiology Reports DG Chest 1 View  Result Date: 10/26/2020 CLINICAL DATA:  Hypertension EXAM: CHEST  1 VIEW COMPARISON:  September 16, 2018 FINDINGS: There are chronic bronchitic changes at the lung bases bilaterally. There is cardiomegaly. The patient is status post prior median sternotomy. There are prominent interstitial lung markings at the lung bases suggestive of interstitial edema. There is no pneumothorax or large pleural effusion. No acute osseous abnormality. IMPRESSION: 1. Cardiomegaly with mild interstitial edema. 2. Chronic bronchitic changes at the lung bases bilaterally. Electronically Signed   By: Constance Holster M.D.   On: 10/26/2020 04:49   CT ANGIO AO+BIFEM W &  OR WO CONTRAST  Result Date: 10/28/2020 CLINICAL DATA:  Sepsis, gangrene of the left  foot and evidence of peripheral vascular disease. EXAM: CT ANGIOGRAPHY OF ABDOMINAL AORTA WITH ILIOFEMORAL RUNOFF TECHNIQUE: Multidetector CT imaging of the abdomen, pelvis and lower extremities was performed using the standard protocol during bolus administration of intravenous contrast. Multiplanar CT image reconstructions and MIPs were obtained to evaluate the vascular anatomy. CONTRAST:  166mL OMNIPAQUE IOHEXOL 350 MG/ML SOLN COMPARISON:  None. FINDINGS: VASCULAR Aorta: Diffuse atherosclerosis without evidence of aneurysm, occlusion or significant aortic stenosis. No evidence of dissection. Celiac: Calcified plaque at the origin of the celiac axis without significant stenosis. Distal branches are patent. SMA: Heavily calcified plaque at the origin of the SMA likely causing significant stenosis 70-80% caliber. Renals: Three separate right renal arteries and single left renal artery demonstrate atherosclerosis without high-grade stenoses. IMA: Patent. RIGHT Lower Extremity Inflow: Heavily calcified plaque throughout the iliac arteries. Common and external iliac arteries demonstrate no significant stenosis or aneurysmal disease. Outflow: Calcified plaque in the common femoral artery without significant stenosis. Profunda femoral artery is patent. Diffuse disease throughout the SFA with maximal stenosis approaching 50-70%. There is an indwelling stent present at the level of the mid to distal SFA which appears patent. Popliteal artery demonstrates diffuse plaque without high-grade stenosis. Runoff: Diffuse disease throughout tibial arteries. The posterior tibial artery appears to be likely continuously patent into the foot. The anterior tibial artery is likely subtotally occluded in several segments but may be essentially patent into the foot. The peroneal artery is also similarly significantly diseased but may be continuously patent. LEFT Lower Extremity Inflow: Iliac arteries demonstrate diffuse calcified plaque.  Common and external iliac arteries demonstrate no significant focal stenosis or evidence of aneurysmal disease. Outflow: Common femoral artery shows diffuse disease without significant stenosis. The profunda femoral artery is open and calcified. The left superficial femoral artery demonstrates initial patency and then becomes occluded in the proximal to mid thigh. There is reconstitution at the SFA/popliteal junction. The popliteal artery is heavily diseased and diffusely stenotic. Runoff: There is severe disease of the tibioperoneal trunk which is severely calcified. All 3 runoff vessels below the knee demonstrate significant calcification with posterior tibial and peroneal arteries demonstrating segments of occlusion beginning in the proximal calf. The anterior tibial artery is diseased but appears to be likely continuously patent into the foot. There is very faint reconstitution of plantar branches in the distal posterior tibial territory. Review of the MIP images confirms the above findings. NON-VASCULAR Lower chest: Small bilateral pleural effusions. Lungs demonstrate significant areas of patchy airspace disease bilaterally consistent with pneumonia. Hepatobiliary: Liver surface is slightly nodular and early cirrhosis suspected. The gallbladder appears unremarkable. Pancreas: Unremarkable. No pancreatic ductal dilatation or surrounding inflammatory changes. Spleen: Normal in size without focal abnormality. Adrenals/Urinary Tract: Bilateral renal cysts appear benign. The bladder is unremarkable. Stomach/Bowel: Bowel shows no evidence of obstruction, inflammation or lesion. No free air identified. Lymphatic: No enlarged lymph nodes identified in the abdomen or pelvis. Reproductive: Prostate is unremarkable. Other: No abdominal wall hernia or abnormality. No abdominopelvic ascites. Musculoskeletal: No acute or significant osseous findings. IMPRESSION: 1. Significant bilateral patchy airspace disease in both lungs  consistent with pneumonia. Small bilateral pleural effusions. 2. 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 3. Diffuse disease throughout the right SFA with maximal stenosis approaching 50-70%. Patent stent at the level of the mid to distal SFA. Continuously patent posterior tibial runoff on the right and diseased anterior tibial and peroneal runoff. 4. Occlusion of  the left superficial femoral artery in the proximal to mid thigh with reconstitution at the SFA/popliteal junction. 5. Severe disease of the left tibioperoneal trunk and segments of occlusions of the left posterior tibial and peroneal arteries. The left anterior tibial artery is diseased but appears to be continuously patent into the foot. There is very faint reconstitution of plantar branches in the distal posterior tibial territory. 6. Probable early cirrhosis. 7. Aortic atherosclerosis. Aortic Atherosclerosis (ICD10-I70.0). Electronically Signed   By: Aletta Edouard M.D.   On: 10/28/2020 16:05   DG ABD ACUTE 2+V W 1V CHEST  Result Date: 10/27/2020 CLINICAL DATA:  Emesis. EXAM: DG ABDOMEN ACUTE WITH 1 VIEW CHEST COMPARISON:  02/09/2013 and chest x-ray 10/26/2020 FINDINGS: Patient combative as best images obtained. Sternotomy wires unchanged. Right-sided PICC line has tip over the SVC. Lungs are adequately inflated demonstrate patchy bilateral airspace opacification which may be due to multifocal infection. No definite effusion. Mild stable cardiomegaly. Abdominopelvic images demonstrate air throughout the colon. There multiple air-filled nondilated small bowel loops present. No free peritoneal air. Degenerative changes of the spine and hips. There are surgical clips over the pelvis. IMPRESSION: 1. Nonspecific, nonobstructive bowel gas pattern. 2. Patchy bilateral airspace process which may be due to multifocal infection. Electronically Signed   By: Marin Olp M.D.   On: 10/27/2020 16:00   DG Foot Complete Left  Result Date: 10/25/2020 CLINICAL DATA:   Ulcer to the great toe and heel EXAM: LEFT FOOT - COMPLETE 3+ VIEW COMPARISON:  05/21/2015 FINDINGS: No acute displaced fracture or malalignment is seen. Large ulcer on the medial side of the first digit. Cortical erosive change on the medial side of first distal phalanx and at the head of the first proximal phalanx suspicious for osteomyelitis. Chronic fracture deformity first proximal phalanx. Sclerosis and mild deformity at the necks of the fourth and fifth metatarsals, suspicious for subacute to developing chronic fractures. No erosive change at the calcaneus. No soft tissue emphysema. Vascular calcifications. IMPRESSION: 1. Cortical erosive change on the medial side of the first distal phalanx and head of the first proximal phalanx suspicious for osteomyelitis. Large overlying ulcer medial side first digit 2. Probable subacute to developing chronic fractures involving the necks of the fourth and fifth metatarsals. Electronically Signed   By: Donavan Foil M.D.   On: 10/25/2020 19:11   [place] EKG SITE RITE  Result Date: 10/27/2020 If Site Rite image not attached, placement could not be confirmed due to current cardiac rhythm.    CBC Recent Labs  Lab 10/25/20 1835 10/26/20 0431 10/28/20 0410  WBC 15.2* 12.9* 26.8*  HGB 11.3* 11.5* 11.2*  HCT 35.0* 36.9* 35.2*  PLT 286 281 289  MCV 95.4 98.1 94.9  MCH 30.8 30.6 30.2  MCHC 32.3 31.2 31.8  RDW 15.6* 15.7* 15.8*  LYMPHSABS 1.7  --   --   MONOABS 1.0  --   --   EOSABS 0.1  --   --   BASOSABS 0.1  --   --     Chemistries  Recent Labs  Lab 10/25/20 1835 10/26/20 0431 10/27/20 1708 10/28/20 0410  NA 140 141 136 137  K 2.9* 2.9* 4.2 4.4  CL 103 103 104 105  CO2 26 20* 23 22  GLUCOSE 161* 193* 229* 242*  BUN _0 27*  CREATININE 0.96 1.09 1.11 1.18  CALCIUM 8.4* 8.2* 7.9* 8.2*  MG  --  1.4*  --   --   AST 23 34  --   --  ALT 20 21  --   --   ALKPHOS 107 142*  --   --   BILITOT 0.7 0.9  --   --     ------------------------------------------------------------------------------------------------------------------ No results for input(s): CHOL, HDL, LDLCALC, TRIG, CHOLHDL, LDLDIRECT in the last 72 hours.  Lab Results  Component Value Date   HGBA1C 6.9 (H) 10/25/2020   ------------------------------------------------------------------------------------------------------------------ No results for input(s): TSH, T4TOTAL, T3FREE, THYROIDAB in the last 72 hours.  Invalid input(s): FREET3 ------------------------------------------------------------------------------------------------------------------ No results for input(s): VITAMINB12, FOLATE, FERRITIN, TIBC, IRON, RETICCTPCT in the last 72 hours.  Coagulation profile Recent Labs  Lab 10/25/20 1835 10/26/20 0431  INR 1.2 1.3*    No results for input(s): DDIMER in the last 72 hours.  Cardiac Enzymes No results for input(s): CKMB, TROPONINI, MYOGLOBIN in the last 168 hours.  Invalid input(s): CK ------------------------------------------------------------------------------------------------------------------ No results found for: BNP  Roxan Hockey M.D on 10/28/2020 at 6:26 PM  Go to [email or web address] - for contact info  [facility] - Office  [phone]
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
## 2020-10-28 NOTE — Progress Notes (Signed)
Report given to 4N Progressive

## 2020-10-28 NOTE — Progress Notes (Addendum)
Pharmacy Antibiotic Note  [name] is a 70 y.o. male admitted on 10/25/2020 with wound infection. Pharmacy has been consulted for Vanco dosing.  VT reported at 73mcg/ml. Vancomycin dose was hung 2 hours late. (so not a true trough) Expect vancomycin true trough to be around 55mcg/ml. Patient with sepsis and infected left foot/osteomyelitis. With concern for osteomyelitis, plan to continue current regimen.  CC/HPI: L heel and L great toe ulcers worsening x 3 months. Wound infections to L foot/toe. Tmax 99.2. Tachy. WBC elevated 15.2.  Plan: Continue Vancomycin 1000mg  IV Q 12 hrs. Goal AUC 400-550. Expected AUC: 525 Continue Cefepime 2g IV q8hr. F/U cxs and clinical progress Monitor V/S, labs and levels as indicated  Height: 5\' 7"  (170.2 cm) Weight: 110.3 kg (243 lb 2.7 oz) IBW/kg (Calculated) : 66.1  Temp (24hrs), Avg:97.2 F (36.2 C), Min:97 F (36.1 C), Max:97.4 F (36.3 C)  Recent Labs  Lab 10/25/20 1835 10/26/20 0431 10/27/20 1708 10/28/20 0410 10/28/20 0836  WBC 15.2* 12.9*  --  26.8*  --   CREATININE 0.96 1.09 1.11 1.18  --   LATICACIDVEN 1.5  --   --   --   --   VANCOTROUGH  --   --   --   --  26*    Estimated Creatinine Clearance: 71 mL/min (by C-G formula based on SCr of 1.18 mg/dL).    Allergies  Allergen Reactions  . Flexeril [Cyclobenzaprine] Shortness Of Breath  . Tamsulosin Hcl Swelling, Rash and Other (See Comments)    Swelling around the mouth with rash on face Swelling around the mouth with rash on face Swelling around the mouth with rash on face  Swelling around the mouth with rash on face  . Tramadol Other (See Comments)    Headache  . Robaxin [Methocarbamol] Rash  . Aspirin     On plavix  . Morphine And Related Itching   Antibiotics: Vanco 1/14> Cefepime 1/14>> Flagyl 1/17>>   Cultures: 1/15 BCX: ngtd 1/16 Urine Cx: 40K CFU/ml, enterobacter 1/15 MRSA PCR is negative  Isac Sarna, BS [place], [place] Clinical Pharmacist Pager  [phone] 10/28/2020 12:35 PM

## 2020-10-29 ENCOUNTER — Inpatient Hospital Stay (HOSPITAL_COMMUNITY): Payer: Medicare Other

## 2020-10-29 DIAGNOSIS — I340 Nonrheumatic mitral (valve) insufficiency: Secondary | ICD-10-CM

## 2020-10-29 DIAGNOSIS — N400 Enlarged prostate without lower urinary tract symptoms: Secondary | ICD-10-CM | POA: Diagnosis not present

## 2020-10-29 DIAGNOSIS — I4892 Unspecified atrial flutter: Secondary | ICD-10-CM | POA: Diagnosis not present

## 2020-10-29 DIAGNOSIS — R0600 Dyspnea, unspecified: Secondary | ICD-10-CM

## 2020-10-29 DIAGNOSIS — A419 Sepsis, unspecified organism: Secondary | ICD-10-CM | POA: Diagnosis not present

## 2020-10-29 DIAGNOSIS — M86172 Other acute osteomyelitis, left ankle and foot: Secondary | ICD-10-CM | POA: Diagnosis not present

## 2020-10-29 LAB — BASIC METABOLIC PANEL
Anion gap: 13 (ref 5–15)
BUN: 25 mg/dL — ABNORMAL HIGH (ref 8–23)
CO2: 21 mmol/L — ABNORMAL LOW (ref 22–32)
Calcium: 7.9 mg/dL — ABNORMAL LOW (ref 8.9–10.3)
Chloride: 104 mmol/L (ref 98–111)
Creatinine, Ser: 1.2 mg/dL (ref 0.61–1.24)
GFR, Estimated: >60 mL/min (ref >60)
Glucose, Bld: 273 mg/dL — ABNORMAL HIGH (ref 70–99)
Potassium: 4.2 mmol/L (ref 3.5–5.1)
Sodium: 138 mmol/L (ref 135–145)

## 2020-10-29 LAB — GLUCOSE, CAPILLARY
Glucose-Capillary: 256 mg/dL — ABNORMAL HIGH (ref 70–99)
Glucose-Capillary: 259 mg/dL — ABNORMAL HIGH (ref 70–99)
Glucose-Capillary: 211 mg/dL — ABNORMAL HIGH (ref 70–99)
Glucose-Capillary: 91 mg/dL (ref 70–99)
Glucose-Capillary: 102 mg/dL — ABNORMAL HIGH (ref 70–99)

## 2020-10-29 LAB — CBC
HCT: 33.4 % — ABNORMAL LOW (ref 39.0–52.0)
Hemoglobin: 11 g/dL — ABNORMAL LOW (ref 13.0–17.0)
MCH: 30.9 pg (ref 26.0–34.0)
MCHC: 32.9 g/dL (ref 30.0–36.0)
MCV: 93.8 fL (ref 80.0–100.0)
Platelets: 270 10*3/uL (ref 150–400)
RBC: 3.56 MIL/uL — ABNORMAL LOW (ref 4.22–5.81)
RDW: 16 % — ABNORMAL HIGH (ref 11.5–15.5)
WBC: 21.6 10*3/uL — ABNORMAL HIGH (ref 4.0–10.5)
nRBC: 0.8 % — ABNORMAL HIGH (ref 0.0–0.2)

## 2020-10-29 LAB — ECHOCARDIOGRAM COMPLETE
AR max vel: 2.1 cm2
AV Area VTI: 2.07 cm2
AV Area mean vel: 2 cm2
AV Mean grad: 2 mmHg
AV Peak grad: 3.4 mmHg
Ao pk vel: 0.92 m/s
Height: 67 in
S' Lateral: 4.8 cm
Weight: 3890.68 oz

## 2020-10-29 MED ORDER — METOPROLOL TARTRATE 5 MG/5ML IV SOLN
5.0000 mg | Freq: Four times a day (QID) | INTRAVENOUS | Status: AC
  Administered 2020-10-29 – 2020-10-30 (×2): 5 mg via INTRAVENOUS
  Filled 2020-10-29 (×2): qty 5

## 2020-10-29 MED ORDER — FUROSEMIDE 10 MG/ML IJ SOLN
INTRAMUSCULAR | Status: AC
  Administered 2020-10-29: 40 mg via INTRAMUSCULAR
  Filled 2020-10-29: qty 4

## 2020-10-29 MED ORDER — PERFLUTREN LIPID MICROSPHERE
1.0000 mL | INTRAVENOUS | Status: AC | PRN
Start: 2020-10-29 — End: 2020-10-29
  Administered 2020-10-29: 8 mL via INTRAVENOUS
  Filled 2020-10-29: qty 10

## 2020-10-29 MED ORDER — FUROSEMIDE 10 MG/ML IJ SOLN
40.0000 mg | Freq: Once | INTRAMUSCULAR | Status: AC
  Administered 2020-10-29: 40 mg via INTRAVENOUS

## 2020-10-29 MED ORDER — VANCOMYCIN HCL 750 MG/150ML IV SOLN
750.0000 mg | Freq: Two times a day (BID) | INTRAVENOUS | Status: DC
  Administered 2020-10-29 – 2020-11-01 (×6): 750 mg via INTRAVENOUS
  Filled 2020-10-29 (×7): qty 150

## 2020-10-29 MED ORDER — HALOPERIDOL LACTATE 5 MG/ML IJ SOLN
1.0000 mg | INTRAMUSCULAR | Status: DC | PRN
  Administered 2020-10-30 – 2020-11-15 (×2): 1 mg via INTRAMUSCULAR
  Filled 2020-10-29 (×2): qty 1
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
## 2020-10-29 NOTE — Progress Notes (Signed)
Inpatient Diabetes Program Recommendations  AACE/ADA: New Consensus Statement on Inpatient Glycemic Control (2015)  Target Ranges:  Prepandial:   less than 140 mg/dL      Peak postprandial:   less than 180 mg/dL (1-2 hours)      Critically ill patients:  140 - 180 mg/dL   Results for CORDELRO, GAUTREAU (MRN [id number]) as of 10/29/2020 07:57  Ref. Range 10/28/2020 08:09 10/28/2020 11:35 10/28/2020 16:10 10/28/2020 19:47  Glucose-Capillary Latest Ref Range: 70 - 99 mg/dL 256 (H)  8 units NOVOLOG  196 (H)  3 units NOVOLOG  210 (H)  5 units NOVOLOG  231 (H)  2 units NOVOLOG    Results for JAVON, SNEE (MRN [id number]) as of 10/29/2020 07:57  Ref. Range 10/29/2020 04:06  Glucose Latest Ref Range: 70 - 99 mg/dL 273 (H)   Results for CONSTANTINE, RUDDICK (MRN [id number]) as of 10/29/2020 07:57  Ref. Range 10/25/2020 18:30  Hemoglobin A1C Latest Ref Range: 4.8 - 5.6 % 6.9 (H)    Home DM Meds: Lantus 40 units daily       Metformin 1000 mg BID  Current Orders: Novolog Moderate Correction Scale/ SSI (0-15 units) TID AC + HS    MD- Note AM CBGs have been >250 the last 2 mornings.  Please consider starting Lantus 20 units Daily (50% total home dose to start)  Please start this AM     --Will follow patient during hospitalization--  Wyn Quaker RN, MSN, CDE Diabetes Coordinator Inpatient Glycemic Control Team Team Pager: [phone] (8a-5p)

## 2020-10-29 NOTE — Significant Event (Signed)
Rapid Response Event Note   Reason for Call :  Tachycardia, tachypnea- not an acute change, pt overall presentation concerning  Initial Focused Assessment:  Pt lying in bed, alert. Oriented to person, following commands. Skin is hot, dry to touch. Lung sounds are diminished in the bases, expiratory wheezing heard in bilateral upper lobes. Mild accessory muscle use. Tachycardic, pulses are 2+, irregular.   VS: T 100.74F rectal, 102/79, HR 113, RR 33, SpO2 100% on 100% NRB CBG: 256   Interventions:  -MD requesting 40mg  IV lasix to be given now -CXR ordered  Plan of Care:  -Wean oxygen as pt tolerates- pt currently weaned to 10L HFNC -Treat fevers with pharmacological and non-pharmacological means  -HOB elevated, oral suction set-up, oral care per protocol  Call rapid response for additional needs  Event Summary:  MD Notified: Dr. Cathlean Sauer Call Time: [place] Time: 0830 End Time: [place], RN

## 2020-10-29 NOTE — Progress Notes (Signed)
  Echocardiogram 2D Echocardiogram has been performed with Definity.  [name] 10/29/2020, 1:53 PM

## 2020-10-29 NOTE — Progress Notes (Signed)
OT Cancellation Note  Patient Details Name: ZACHARIE PORTNER MRN: [id number] DOB: 07-14-1951   Cancelled Treatment:    Reason Eval/Treat Not Completed: Other (comment) (rapid response called - holding pending workup)  Billey Chang, OTR/L  Acute Rehabilitation Services Pager: [phone] Office: [phone] .  10/29/2020, 9:37 AM

## 2020-10-29 NOTE — Progress Notes (Signed)
Pharmacy Antibiotic Note  [name] is a 70 y.o. male admitted on 10/25/2020 with wound infection. Pharmacy has been consulted for Vanco dosing.  VT reported at 17mcg/ml on 1/17. Vancomycin dose was hung 2 hours late. (so not a true trough) Expect vancomycin true trough to be around 53mcg/ml. Patient with sepsis and infected left foot with concern for osteomyelitis  WBC elevated but trending down. SCr wnl   Plan: Decrease Vancomycin to 750mg  IV Q 12 hrs. Goal AUC 400-550. Will recheck AUC at steady state Continue Cefepime 2g IV q8hr. F/U cxs and clinical progress Monitor V/S, labs and levels as indicated  Height: 5\' 7"  (170.2 cm) Weight: 110.3 kg (243 lb 2.7 oz) IBW/kg (Calculated) : 66.1  Temp (24hrs), Avg:99.1 F (37.3 C), Min:97.7 F (36.5 C), Max:100.6 F (38.1 C)  Recent Labs  Lab 10/25/20 1835 10/26/20 0431 10/27/20 1708 10/28/20 0410 10/28/20 0836 10/29/20 0406  WBC 15.2* 12.9*  --  26.8*  --  21.6*  CREATININE 0.96 1.09 1.11 1.18  --  1.20  LATICACIDVEN 1.5  --   --   --   --   --   VANCOTROUGH  --   --   --   --  26*  --     Estimated Creatinine Clearance: 69.8 mL/min (by C-G formula based on SCr of 1.2 mg/dL).    Allergies  Allergen Reactions  . Flexeril [Cyclobenzaprine] Shortness Of Breath  . Tamsulosin Hcl Swelling, Rash and Other (See Comments)    Swelling around the mouth with rash on face Swelling around the mouth with rash on face Swelling around the mouth with rash on face  Swelling around the mouth with rash on face  . Tramadol Other (See Comments)    Headache  . Robaxin [Methocarbamol] Rash  . Aspirin     On plavix  . Morphine And Related Itching   Antibiotics: Vanco 1/14> Cefepime 1/14>> Flagyl 1/17>>   Cultures: 1/15 BCX: ngtd 1/16 Urine Cx: 40K CFU/ml, enterobacter 1/15 MRSA PCR is negative  Albertina Parr, PharmD., BCPS, BCCCP Clinical Pharmacist Please refer to [facility] for unit-specific pharmacist

## 2020-10-29 NOTE — Progress Notes (Signed)
  Pt arrived at [facility] very late on 10/28/2020 -- Pt was not at [place] on 10/29/20  Spoke with Flow manager to verify that a [facility] physician has been assigned to pt at [facility] for 10/29/2020 - Dr.Charles Fields (Vascular Surgery) will need to be notified that pt has arrived at [facility]-  Case previously Discussed with Dr.Charles Fields (Vascular Surgery) on 10/28/20 - Official Vascular surgery consult requested  Roxan Hockey, MD

## 2020-10-29 NOTE — Progress Notes (Signed)
Patient arrived to unit via [facility] from [facility]. Patient is A&Ox1 (self). Vitals signs WNL; with an elevated HR at 116 and RR at 26. Patient arrived on [place] at 4L.   Patients Belongings: bag of patients clothes

## 2020-10-29 NOTE — Care Management Important Message (Signed)
Important Message  Patient Details  Name: [name] MRN: [id number] Date of Birth: 12/04/1950   Medicare Important Message Given:  Yes     Orbie Pyo 10/29/2020, 3:13 PM

## 2020-10-29 NOTE — Progress Notes (Signed)
   10/29/20 0743  Assess: MEWS Score  Temp 99.7 F (37.6 C)  BP 110/73  Pulse Rate 62  ECG Heart Rate (!) 117  Resp (!) 32  SpO2 99 %  O2 Device Simple Mask  Assess: MEWS Score  MEWS Temp 0  MEWS Systolic 0  MEWS Pulse 2  MEWS RR 2  MEWS LOC 0  MEWS Score 4  MEWS Score Color Red  Assess: if the MEWS score is Yellow or Red  Were vital signs taken at a resting state? Yes  Focused Assessment Change from prior assessment (see assessment flowsheet)  Early Detection of Sepsis Score *See Row Information* High  MEWS guidelines implemented *See Row Information* No, previously red, continue vital signs every 4 hours  Treat  MEWS Interventions Administered scheduled meds/treatments  Pain Scale 0-10  Pain Score 5  Take Vital Signs  Increase Vital Sign Frequency  Red: Q 1hr X 4 then Q 4hr X 4, if remains red, continue Q 4hrs  Escalate  MEWS: Escalate Red: discuss with charge nurse/RN and provider, consider discussing with RRT  Notify: Charge Nurse/RN  Name of Charge Nurse/RN Notified Mika RN  Date Charge Nurse/RN Notified 10/29/20  Time Charge Nurse/RN Notified 0800  Notify: Provider  Provider Name/Title Arrien  Date Provider Notified 10/29/20  Time Provider Notified 0800  Notification Type Face-to-face  Notification Reason Change in status  Response See new orders  Date of Provider Response 10/29/20  Time of Provider Response 0800  Notify: Rapid Response  Name of Rapid Response RN Notified Wilburn Cornelia RN  Date Rapid Response Notified 10/29/20  Time Rapid Response Notified 0800  Document  Patient Outcome Not stable and remains on department  Progress note created (see row info) Yes   Patients RR and HR are still elevated. Orders have been carried out at this time. Will continue to monitor. Rapid and MD are aware of the patients status. Wife was contacted and updated on patients condition.
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
## 2020-10-29 NOTE — Progress Notes (Signed)
PT Cancellation Note  Patient Details Name: [name] MRN: [id number] DOB: May 15, 1951   Cancelled Treatment:    Reason Eval/Treat Not Completed: Medical issues which prohibited therapy.  Other (comment) (rapid response called - holding pending workup) 10/29/2020  Ginger Carne., PT Acute Rehabilitation Services [phone]  (pager) [phone]  (office)  [name] 10/29/2020, 1:30 PM

## 2020-10-30 DIAGNOSIS — I739 Peripheral vascular disease, unspecified: Secondary | ICD-10-CM | POA: Diagnosis not present

## 2020-10-30 DIAGNOSIS — E11621 Type 2 diabetes mellitus with foot ulcer: Secondary | ICD-10-CM

## 2020-10-30 DIAGNOSIS — M86172 Other acute osteomyelitis, left ankle and foot: Secondary | ICD-10-CM | POA: Diagnosis not present

## 2020-10-30 DIAGNOSIS — E1165 Type 2 diabetes mellitus with hyperglycemia: Secondary | ICD-10-CM | POA: Diagnosis not present

## 2020-10-30 DIAGNOSIS — I4892 Unspecified atrial flutter: Secondary | ICD-10-CM

## 2020-10-30 DIAGNOSIS — A419 Sepsis, unspecified organism: Principal | ICD-10-CM

## 2020-10-30 DIAGNOSIS — I4891 Unspecified atrial fibrillation: Secondary | ICD-10-CM

## 2020-10-30 DIAGNOSIS — M869 Osteomyelitis, unspecified: Secondary | ICD-10-CM

## 2020-10-30 DIAGNOSIS — F0150 Vascular dementia without behavioral disturbance: Secondary | ICD-10-CM

## 2020-10-30 DIAGNOSIS — I2510 Atherosclerotic heart disease of native coronary artery without angina pectoris: Secondary | ICD-10-CM

## 2020-10-30 DIAGNOSIS — Z8673 Personal history of transient ischemic attack (TIA), and cerebral infarction without residual deficits: Secondary | ICD-10-CM

## 2020-10-30 DIAGNOSIS — I429 Cardiomyopathy, unspecified: Secondary | ICD-10-CM

## 2020-10-30 DIAGNOSIS — L97429 Non-pressure chronic ulcer of left heel and midfoot with unspecified severity: Secondary | ICD-10-CM

## 2020-10-30 LAB — CBC
HCT: 34.7 % — ABNORMAL LOW (ref 39.0–52.0)
Hemoglobin: 10.8 g/dL — ABNORMAL LOW (ref 13.0–17.0)
MCH: 29.9 pg (ref 26.0–34.0)
MCHC: 31.1 g/dL (ref 30.0–36.0)
MCV: 96.1 fL (ref 80.0–100.0)
Platelets: 239 10*3/uL (ref 150–400)
RBC: 3.61 MIL/uL — ABNORMAL LOW (ref 4.22–5.81)
RDW: 15.9 % — ABNORMAL HIGH (ref 11.5–15.5)
WBC: 19.2 10*3/uL — ABNORMAL HIGH (ref 4.0–10.5)
nRBC: 0.9 % — ABNORMAL HIGH (ref 0.0–0.2)

## 2020-10-30 LAB — BASIC METABOLIC PANEL
Anion gap: 13 (ref 5–15)
BUN: 29 mg/dL — ABNORMAL HIGH (ref 8–23)
CO2: 21 mmol/L — ABNORMAL LOW (ref 22–32)
Calcium: 7.8 mg/dL — ABNORMAL LOW (ref 8.9–10.3)
Chloride: 107 mmol/L (ref 98–111)
Creatinine, Ser: 1.23 mg/dL (ref 0.61–1.24)
GFR, Estimated: >60 mL/min (ref >60)
Glucose, Bld: 162 mg/dL — ABNORMAL HIGH (ref 70–99)
Potassium: 3.7 mmol/L (ref 3.5–5.1)
Sodium: 141 mmol/L (ref 135–145)

## 2020-10-30 LAB — GLUCOSE, CAPILLARY
Glucose-Capillary: 187 mg/dL — ABNORMAL HIGH (ref 70–99)
Glucose-Capillary: 225 mg/dL — ABNORMAL HIGH (ref 70–99)
Glucose-Capillary: 210 mg/dL — ABNORMAL HIGH (ref 70–99)
Glucose-Capillary: 185 mg/dL — ABNORMAL HIGH (ref 70–99)

## 2020-10-30 LAB — CULTURE, BLOOD (SINGLE): Culture: NO GROWTH

## 2020-10-30 MED ORDER — FUROSEMIDE 10 MG/ML IJ SOLN
40.0000 mg | Freq: Once | INTRAMUSCULAR | Status: AC
  Administered 2020-10-30: 40 mg via INTRAVENOUS
  Filled 2020-10-30: qty 4

## 2020-10-30 MED ORDER — METOPROLOL SUCCINATE ER 25 MG PO TB24
25.0000 mg | ORAL_TABLET | Freq: Every day | ORAL | Status: DC
  Administered 2020-10-30 – 2020-11-13 (×13): 25 mg via ORAL
  Filled 2020-10-30 (×15): qty 1

## 2020-10-30 MED ORDER — LOSARTAN POTASSIUM 50 MG PO TABS
25.0000 mg | ORAL_TABLET | Freq: Every day | ORAL | Status: DC
  Administered 2020-10-31: 25 mg via ORAL
  Filled 2020-10-30: qty 1
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
## 2020-10-30 NOTE — Progress Notes (Signed)
Wife is at the bedside with patient. I briefly discussed the patient and possible plane of care. I did ask the wife if she was aware of the possibility of the patient needing a AKA. Wife stated that was discussed last year and the patient refused. Wife seems shocked by the information and the patients current state. I explained what his condition could look like with or without the AKA. The wife is very tearful and stated he doesn't want his leg cut off and he is not going to a SNF. I asked if she has any family or friends that can help. Wife stated they only have the patients sister.

## 2020-10-30 NOTE — Progress Notes (Signed)
PT Cancellation Note  Patient Details Name: [name] MRN: [id number] DOB: September 10, 1951   Cancelled Treatment:    Reason Eval/Treat Not Completed: Medical issues which prohibited therapy. 10/30/2020  Ginger Carne., PT Acute Rehabilitation Services [phone]  (pager) [phone]  (office)   Tessie Fass Aggie Douse 10/30/2020, 5:24 PM

## 2020-10-30 NOTE — Progress Notes (Signed)
OT Cancellation Note  Patient Details Name: [name] MRN: [id number] DOB: 12-16-1950   Cancelled Treatment:    Reason Eval/Treat Not Completed: Other (comment); spoke with RN who reports pt has been agitated and combative, request OT hold at this time. Will follow up as able.  Lou Cal, OT Acute Rehabilitation Services Pager [phone] Office [phone]   Raymondo Band 10/30/2020, 2:50 PM

## 2020-10-30 NOTE — TOC Progression Note (Signed)
Transition of Care [facility]) - Progression Note    Patient Details  Name: [name] MRN: [id number] Date of Birth: [birth year]  Transition of Care [facility]) CM/SW [place], [place] Phone Number: 10/30/2020, 3:53 PM  Clinical Narrative:     CSW following for medical readiness.  Thurmond Butts, MSW, LCSW Clinical Social Worker   Expected Discharge Plan: Skilled Nursing Facility Barriers to Discharge: Continued Medical Work up  Expected Discharge Plan and Services Expected Discharge Plan: [place] In-house Referral: Clinical Social Work   Post Acute Care Choice: [place] Living arrangements for the past 2 months: Single Family Home                                       Social Determinants of Health (SDOH) Interventions    Readmission Risk Interventions No flowsheet data found.
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
## 2020-10-30 NOTE — Progress Notes (Addendum)
Progress Note    10/30/2020 7:22 AM * No surgery found *  Subjective:  Delirium improved but with expressive dysphagia. Indicates his left foot is painful   Vitals:   10/30/20 0329 10/30/20 0400  BP: 138/76   Pulse: (!) 104   Resp: (!) 23   Temp:  98 F (36.7 C)  SpO2: 99%     Physical Exam: Cardiac:  RRR Lungs:  nonlabored Extremities:  Exam unchanged   CBC    Component Value Date/Time   WBC 19.2 (H) 10/30/2020 0451   RBC 3.61 (L) 10/30/2020 0451   HGB 10.8 (L) 10/30/2020 0451   HGB 13.3 05/31/2020 0915   HCT 34.7 (L) 10/30/2020 0451   HCT 40.1 05/31/2020 0915   PLT 239 10/30/2020 0451   PLT 199 05/31/2020 0915   MCV 96.1 10/30/2020 0451   MCV 92 05/31/2020 0915   MCH 29.9 10/30/2020 0451   MCHC 31.1 10/30/2020 0451   RDW 15.9 (H) 10/30/2020 0451   RDW 13.0 05/31/2020 0915   LYMPHSABS 1.7 10/25/2020 1835   MONOABS 1.0 10/25/2020 1835   EOSABS 0.1 10/25/2020 1835   BASOSABS 0.1 10/25/2020 1835    BMET    Component Value Date/Time   NA 141 10/30/2020 0451   NA 139 05/31/2020 0915   K 3.7 10/30/2020 0451   CL 107 10/30/2020 0451   CO2 21 (L) 10/30/2020 0451   GLUCOSE 162 (H) 10/30/2020 0451   BUN 29 (H) 10/30/2020 0451   BUN 12 05/31/2020 0915   CREATININE 1.23 10/30/2020 0451   CREATININE 0.97 01/06/2019 1527   CALCIUM 7.8 (L) 10/30/2020 0451   GFRNONAA >60 10/30/2020 0451   GFRNONAA 81 01/06/2019 1527   GFRAA 88 05/31/2020 0915   GFRAA 94 01/06/2019 1527     Intake/Output Summary (Last 24 hours) at 10/30/2020 0722 Last data filed at 10/30/2020 0511 Gross per 24 hour  Intake 1288.62 ml  Output 2450 ml  Net -1161.38 ml    HOSPITAL MEDICATIONS Scheduled Meds: . atorvastatin  40 mg Oral Daily  . baclofen  5 mg Oral TID  . Chlorhexidine Gluconate Cloth  6 each Topical Daily  . clopidogrel  75 mg Oral Daily  . donepezil  5 mg Oral QHS  . enoxaparin (LOVENOX) injection  110 mg Subcutaneous BID  . feeding supplement (GLUCERNA SHAKE)  237  mL Oral TID BM  . finasteride  5 mg Oral Q1200  . insulin aspart  0-15 Units Subcutaneous TID WC  . insulin aspart  0-5 Units Subcutaneous QHS  . memantine  5 mg Oral BID  . metoprolol tartrate  25 mg Oral BID  . pantoprazole  40 mg Oral Daily  . sodium chloride flush  10-40 mL Intracatheter Q12H   Continuous Infusions: . amiodarone 30 mg/hr (10/30/20 0051)  . ceFEPime (MAXIPIME) IV 2 g (10/30/20 0501)  . metronidazole 500 mg (10/30/20 0013)  . vancomycin 750 mg (10/30/20 0157)   PRN Meds:.acetaminophen, haloperidol lactate, ondansetron (ZOFRAN) IV, oxyCODONE-acetaminophen, sodium chloride flush  Assessment: PAD with left heel gangrene    Tm 100.6; WBC trending down   Plan: -Overall improved. needs left AKA, timing to be determined -DVT prophylaxis:  Lovenox   Risa Grill, PA-C [facility] [phone] 10/30/2020  7:22 AM   Pt refusing AKA still, not sure he is competent for medical decisions. Wife coming in later today.  Available by pager today if she has questions.  Otherwise no other recommendations.  Ruta Hinds, MD [facility] [place] Office: [phone]

## 2020-10-31 DIAGNOSIS — E1165 Type 2 diabetes mellitus with hyperglycemia: Secondary | ICD-10-CM | POA: Diagnosis not present

## 2020-10-31 DIAGNOSIS — I4891 Unspecified atrial fibrillation: Secondary | ICD-10-CM | POA: Diagnosis not present

## 2020-10-31 DIAGNOSIS — I739 Peripheral vascular disease, unspecified: Secondary | ICD-10-CM | POA: Diagnosis not present

## 2020-10-31 DIAGNOSIS — M86172 Other acute osteomyelitis, left ankle and foot: Secondary | ICD-10-CM | POA: Diagnosis not present

## 2020-10-31 LAB — BLOOD GAS, ARTERIAL
Acid-Base Excess: 2.7 mmol/L — ABNORMAL HIGH (ref 0.0–2.0)
Bicarbonate: 25.7 mmol/L (ref 20.0–28.0)
FIO2: 60
O2 Saturation: 94.6 %
Patient temperature: 37.6
pCO2 arterial: 33.2 mmHg (ref 32.0–48.0)
pH, Arterial: 7.503 — ABNORMAL HIGH (ref 7.350–7.450)
pO2, Arterial: 73.7 mmHg — ABNORMAL LOW (ref 83.0–108.0)

## 2020-10-31 LAB — BASIC METABOLIC PANEL
Anion gap: 12 (ref 5–15)
BUN: 25 mg/dL — ABNORMAL HIGH (ref 8–23)
CO2: 24 mmol/L (ref 22–32)
Calcium: 7.5 mg/dL — ABNORMAL LOW (ref 8.9–10.3)
Chloride: 107 mmol/L (ref 98–111)
Creatinine, Ser: 1.17 mg/dL (ref 0.61–1.24)
GFR, Estimated: >60 mL/min (ref >60)
Glucose, Bld: 197 mg/dL — ABNORMAL HIGH (ref 70–99)
Potassium: 2.9 mmol/L — ABNORMAL LOW (ref 3.5–5.1)
Sodium: 143 mmol/L (ref 135–145)

## 2020-10-31 LAB — CBC
HCT: 34.3 % — ABNORMAL LOW (ref 39.0–52.0)
Hemoglobin: 11.5 g/dL — ABNORMAL LOW (ref 13.0–17.0)
MCH: 31.3 pg (ref 26.0–34.0)
MCHC: 33.5 g/dL (ref 30.0–36.0)
MCV: 93.2 fL (ref 80.0–100.0)
Platelets: 257 10*3/uL (ref 150–400)
RBC: 3.68 MIL/uL — ABNORMAL LOW (ref 4.22–5.81)
RDW: 16.4 % — ABNORMAL HIGH (ref 11.5–15.5)
WBC: 19.7 10*3/uL — ABNORMAL HIGH (ref 4.0–10.5)
nRBC: 0.4 % — ABNORMAL HIGH (ref 0.0–0.2)

## 2020-10-31 LAB — MAGNESIUM: Magnesium: 1.9 mg/dL (ref 1.7–2.4)

## 2020-10-31 LAB — GLUCOSE, CAPILLARY
Glucose-Capillary: 242 mg/dL — ABNORMAL HIGH (ref 70–99)
Glucose-Capillary: 223 mg/dL — ABNORMAL HIGH (ref 70–99)
Glucose-Capillary: 140 mg/dL — ABNORMAL HIGH (ref 70–99)
Glucose-Capillary: 276 mg/dL — ABNORMAL HIGH (ref 70–99)
Glucose-Capillary: 276 mg/dL — ABNORMAL HIGH (ref 70–99)

## 2020-10-31 MED ORDER — POTASSIUM CHLORIDE CRYS ER 20 MEQ PO TBCR: 40.0000 meq | EXTENDED_RELEASE_TABLET | ORAL | Status: DC

## 2020-10-31 MED ORDER — POTASSIUM CHLORIDE 10 MEQ/50ML IV SOLN: 10.0000 meq | INTRAVENOUS | Status: DC

## 2020-10-31 MED ORDER — POTASSIUM CHLORIDE 20 MEQ PO PACK: 40.0000 meq | PACK | Freq: Once | ORAL | Status: DC

## 2020-10-31 MED ORDER — FUROSEMIDE 10 MG/ML IJ SOLN
40.0000 mg | Freq: Once | INTRAMUSCULAR | Status: AC
  Administered 2020-10-31: 40 mg via INTRAVENOUS
  Filled 2020-10-31: qty 4

## 2020-10-31 MED ORDER — POTASSIUM CHLORIDE 20 MEQ PO PACK: 40.0000 meq | PACK | Freq: Two times a day (BID) | ORAL | Status: DC

## 2020-10-31 MED ORDER — POTASSIUM CHLORIDE 20 MEQ PO PACK
40.0000 meq | PACK | Freq: Once | ORAL | Status: AC
  Administered 2020-10-31: 40 meq via ORAL
  Filled 2020-10-31: qty 2

## 2020-10-31 MED ORDER — POTASSIUM CHLORIDE 20 MEQ PO PACK
40.0000 meq | PACK | Freq: Two times a day (BID) | ORAL | Status: DC
  Administered 2020-10-31 – 2020-11-02 (×4): 40 meq via ORAL
  Filled 2020-10-31 (×5): qty 2

## 2020-10-31 NOTE — Progress Notes (Signed)
Inpatient Diabetes Program Recommendations  AACE/ADA: New Consensus Statement on Inpatient Glycemic Control (2015)  Target Ranges:  Prepandial:   less than 140 mg/dL      Peak postprandial:   less than 180 mg/dL (1-2 hours)      Critically ill patients:  140 - 180 mg/dL   Results for INEZ, ROSATO (MRN [id number]) as of 10/31/2020 11:00  Ref. Range 10/30/2020 07:44 10/30/2020 11:12 10/30/2020 16:02 10/30/2020 22:42  Glucose-Capillary Latest Ref Range: 70 - 99 mg/dL 187 (H) 225 (H) 210 (H) 185 (H)   Results for CRAVEN, CREAN (MRN [id number]) as of 10/31/2020 11:00  Ref. Range 10/31/2020 08:54  Glucose-Capillary Latest Ref Range: 70 - 99 mg/dL 242 (H)   Home DM Meds: Lantus 40 units daily                             Metformin 1000 mg BID  Current Orders: Novolog Moderate Correction Scale/ SSI (0-15 units) TID AC + HS    MD- Please consider starting Lantus 20 units Daily (50% total home dose to start)  Please start this AM     --Will follow patient during hospitalization--  Wyn Quaker RN, MSN, CDE Diabetes Coordinator Inpatient Glycemic Control Team Team Pager: [phone] (8a-5p)
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
## 2020-10-31 NOTE — Progress Notes (Signed)
Palliative in to see patient. I expressed my concerns for the patient and his wife in depth. Patient prognosis is not well not sure the wife is understanding this. Palliative is planning a meeting with the wife and son. [facility] was notified of the patient status at this time. Dr. Erlinda Hong was updated as well.

## 2020-10-31 NOTE — Progress Notes (Signed)
I was notified by Katherina Right RN that my patients wife had called while I was with another patients room.  The wife voiced that she did not want the DNR. I notified Dr. Erlinda Hong of this information. Dr. Erlinda Hong called the wife back and confirmed her wishes. DNR cancelled and Full Code resumed.

## 2020-10-31 NOTE — Progress Notes (Addendum)
  Progress Note    10/31/2020 7:44 AM  Subjective:  Confused   Vitals:   10/30/20 2330 10/31/20 0348  BP: (!) 123/91 (!) 131/51  Pulse: (!) 40 64  Resp: 19 18  Temp: 98.9 F (37.2 C) 97.6 F (36.4 C)  SpO2: 100% 100%   Physical Exam: Lungs:  Non labored on [place] Extremities:  Wounds L foot Abdomen:  Soft Neurologic: A&O  CBC    Component Value Date/Time   WBC 19.7 (H) 10/31/2020 0400   RBC 3.68 (L) 10/31/2020 0400   HGB 11.5 (L) 10/31/2020 0400   HGB 13.3 05/31/2020 0915   HCT 34.3 (L) 10/31/2020 0400   HCT 40.1 05/31/2020 0915   PLT 257 10/31/2020 0400   PLT 199 05/31/2020 0915   MCV 93.2 10/31/2020 0400   MCV 92 05/31/2020 0915   MCH 31.3 10/31/2020 0400   MCHC 33.5 10/31/2020 0400   RDW 16.4 (H) 10/31/2020 0400   RDW 13.0 05/31/2020 0915   LYMPHSABS 1.7 10/25/2020 1835   MONOABS 1.0 10/25/2020 1835   EOSABS 0.1 10/25/2020 1835   BASOSABS 0.1 10/25/2020 1835    BMET    Component Value Date/Time   NA 143 10/31/2020 0400   NA 139 05/31/2020 0915   K 2.9 (L) 10/31/2020 0400   CL 107 10/31/2020 0400   CO2 24 10/31/2020 0400   GLUCOSE 197 (H) 10/31/2020 0400   BUN 25 (H) 10/31/2020 0400   BUN 12 05/31/2020 0915   CREATININE 1.17 10/31/2020 0400   CREATININE 0.97 01/06/2019 1527   CALCIUM 7.5 (L) 10/31/2020 0400   GFRNONAA >60 10/31/2020 0400   GFRNONAA 81 01/06/2019 1527   GFRAA 88 05/31/2020 0915   GFRAA 94 01/06/2019 1527    INR    Component Value Date/Time   INR 1.3 (H) 10/26/2020 0431     Intake/Output Summary (Last 24 hours) at 10/31/2020 0744 Last data filed at 10/31/2020 0401 Gross per 24 hour  Intake 60 ml  Output 1800 ml  Net -1740 ml     Assessment/Plan:  70 y.o. male with extensive L foot wounds  Patient and wife not consenting to L AKA currently No further recommendations from vascular standpoint Call if questions   Dagoberto Ligas, PA-C [facility] [phone] 10/31/2020 7:44 AM  Agree with above  currently family does not wish to proceed with amputation.  Please reconsult if family decision making changes.  Ruta Hinds, MD [facility] [place] Office: [phone]

## 2020-10-31 NOTE — Progress Notes (Signed)
Wife is in to see patient. With the help of the wife we were able to get patient to take his potasium 40 meq PO.

## 2020-10-31 NOTE — Progress Notes (Signed)
Patient is not willing to take his meds this morning. He spit them out repeatedly. Dr. Erlinda Hong was made aware of his lack of compliance with PO medications. I have requested for the doctors to please contact the wife and inform the wife of the patients condition and prognosis.

## 2020-10-31 NOTE — Progress Notes (Signed)
Palliative Medicine RN Note: Rec'd call from Dr Erlinda Hong with referral. She reports wife will be here today between 1400-1500 and asked if PMT can be there then; I have paged it out to our providers, but no one has responded that they can take it.  One of our providers will reach out when they are available.  Marjie Skiff Jerold Yoss, RN, BSN, [facility] Palliative Medicine Team 10/31/2020 2:15 PM Office [phone]

## 2020-10-31 NOTE — Progress Notes (Addendum)
    Review of chart, patient and wife are refusing surgery. Vascular has signed off. Newly reduced EF with acute hypoxic respiratory failure. He has been altered, now refusing medications. Per Primary, wife is agreeable to talk with palliative care. He has now been made a DNR. Cardiology will sign off, available as needed.   Barnet Pall, NP-C 10/31/2020, 12:46 PM Pager: [phone]
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
## 2020-10-31 NOTE — Progress Notes (Signed)
Pt wife called and this nurse had answered the phone. Pt nurse was busy in pt room so this nurse spoke with her. The wife stated that Dr. Erlinda Hong had called her. This nurse questioned her if there was any thing I could answer for her that pt nurse was busy. She stated that several months ago pt had told her that if "push came to shove he wants to be resuscitated". This nurse told the pt nurse what was said in the conversation. Katherina Right RN

## 2020-10-31 NOTE — Progress Notes (Signed)
On entering this shift this nurse along with  Scherrie Merritts RN spoke with wife at the bedside about a possible DNR. Wife was under the impression that a DNR meant patient would be sent to hospice which the wife does not want. This nurse along with dayshift nurse informed patient of what DNR status meant. Pt wife stated she will talk with pt sister in law to seek advice and will have a decision tomorrow.

## 2020-10-31 NOTE — Progress Notes (Signed)
OT Cancellation Note  Patient Details Name: [name] MRN: [id number] DOB: 10/05/1951   Cancelled Treatment:    Reason Eval/Treat Not Completed: Medical issues which prohibited therapy. Spoke with RN [name]) who said pt is not DNR and wife to speak with pallative as to how she wants to proceed. Will await pallative discussion before proceeding with evaluation.  Golden Circle, OTR/L Acute Rehab Services Pager [phone] Office [phone]     Almon Register 10/31/2020, 1:16 PM

## 2020-10-31 NOTE — Progress Notes (Signed)
PT Cancellation Note  Patient Details Name: [name] MRN: [id number] DOB: July 10, 1951   Cancelled Treatment:    Reason Eval/Treat Not Completed: Medical issues which prohibited therapy;PT screened, no needs identified, will sign off - x3 medical cancels in a row for PT, per policy PT to sign off. Please reconsult when medically appropriate.   [name], PT Acute Rehabilitation Services Pager [phone]  Office [phone]    [name] 10/31/2020, 2:51 PM

## 2020-11-01 DIAGNOSIS — Z515 Encounter for palliative care: Secondary | ICD-10-CM

## 2020-11-01 DIAGNOSIS — E1165 Type 2 diabetes mellitus with hyperglycemia: Secondary | ICD-10-CM | POA: Diagnosis not present

## 2020-11-01 DIAGNOSIS — Z66 Do not resuscitate: Secondary | ICD-10-CM | POA: Diagnosis not present

## 2020-11-01 DIAGNOSIS — E876 Hypokalemia: Secondary | ICD-10-CM | POA: Diagnosis not present

## 2020-11-01 DIAGNOSIS — Z7189 Other specified counseling: Secondary | ICD-10-CM

## 2020-11-01 LAB — MAGNESIUM: Magnesium: 1.8 mg/dL (ref 1.7–2.4)

## 2020-11-01 LAB — BASIC METABOLIC PANEL
Anion gap: 10 (ref 5–15)
BUN: 20 mg/dL (ref 8–23)
CO2: 24 mmol/L (ref 22–32)
Calcium: 7.5 mg/dL — ABNORMAL LOW (ref 8.9–10.3)
Chloride: 111 mmol/L (ref 98–111)
Creatinine, Ser: 1.08 mg/dL (ref 0.61–1.24)
GFR, Estimated: >60 mL/min (ref >60)
Glucose, Bld: 199 mg/dL — ABNORMAL HIGH (ref 70–99)
Potassium: 3.2 mmol/L — ABNORMAL LOW (ref 3.5–5.1)
Sodium: 145 mmol/L (ref 135–145)

## 2020-11-01 LAB — GLUCOSE, CAPILLARY
Glucose-Capillary: 252 mg/dL — ABNORMAL HIGH (ref 70–99)
Glucose-Capillary: 301 mg/dL — ABNORMAL HIGH (ref 70–99)
Glucose-Capillary: 280 mg/dL — ABNORMAL HIGH (ref 70–99)
Glucose-Capillary: 214 mg/dL — ABNORMAL HIGH (ref 70–99)

## 2020-11-01 LAB — LACTIC ACID, PLASMA: Lactic Acid, Venous: 1.5 mmol/L (ref 0.5–1.9)

## 2020-11-01 MED ORDER — FUROSEMIDE 10 MG/ML IJ SOLN
40.0000 mg | Freq: Once | INTRAMUSCULAR | Status: AC
  Administered 2020-11-01: 40 mg via INTRAVENOUS
  Filled 2020-11-01: qty 4

## 2020-11-01 MED ORDER — MAGNESIUM SULFATE 2 GM/50ML IV SOLN
2.0000 g | Freq: Once | INTRAVENOUS | Status: AC
  Administered 2020-11-01: 2 g via INTRAVENOUS
  Filled 2020-11-01: qty 50

## 2020-11-01 MED ORDER — SODIUM CHLORIDE 0.9 % IV SOLN
2.0000 g | INTRAVENOUS | Status: DC
  Administered 2020-11-01 – 2020-11-03 (×3): 2 g via INTRAVENOUS
  Filled 2020-11-01 (×3): qty 20
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
## 2020-11-01 NOTE — Progress Notes (Signed)
Results for QUENTON, RECENDEZ (MRN [id number]) as of 11/01/2020 13:46  Ref. Range 10/31/2020 16:43 10/31/2020 21:07 10/31/2020 22:34 11/01/2020 08:07 11/01/2020 12:15  Glucose-Capillary Latest Ref Range: 70 - 99 mg/dL 140 (H) 276 (H) 276 (H) 252 (H) 301 (H)  Noted that blood sugars continue to be greater than 180 mg/dl.  Recommend adding Lantus 20 units daily (1/2 of home dose of Lantus 40 units daily) if blood sugars continue to be elevated.  Continue other insulin as ordered.  Harvel Ricks RN BSN CDE Diabetes Coordinator Pager: [phone]  8am-5pm

## 2020-11-01 NOTE — TOC Progression Note (Signed)
Transition of Care (TOC) - Progression Note  Marvetta Gibbons RN,BSN Transitions of Care Unit 4NP (non trauma) - RN Case Manager See Treatment Team for direct Phone #   Patient Details  Name: GRANT SWAGER MRN: [id number] Date of Birth: 1951/03/23  Transition of Care [facility]) CM/SW Contact  Dahlia Client, Romeo Rabon, RN Phone Number: 11/01/2020, 3:44 PM  Clinical Narrative:    Pt from home with wife, noted MD notes reflecting that pt/wife refusing [facility] at this time. Received referral from [facility] regarding Hospice information request for wife/family. CM went to room to speak with wife, sister of patient also present. Discussed possible transition home with hospice vs residential hospice home. List provided for Hospice choice per Medicare.gov- wife states son is on his way and should be here first of next week. She would like to wait until he is present before making in decisions. Offered to call one of the agencies to answer any of her questions- wife declined at this time. Wife does state that she is leaning on wanting to take patient home with hospice but not sure she can handle it. Explained some of differences between [place] vs Residential Hospice. TOC to f/u with wife/family after son arrives- offered to wife that should she change her mind and want CM to reach out to one of the Hospice agencies just to let staff know.  Pt remains confused.    Expected Discharge Plan: [place] Barriers to Discharge: Continued Medical Work up  Expected Discharge Plan and Services Expected Discharge Plan: [place] In-house Referral: Clinical Social Work   Post Acute Care Choice: Hospice Living arrangements for the past 2 months: Single Family Home                                       Social Determinants of Health (SDOH) Interventions    Readmission Risk Interventions No flowsheet data found.

## 2020-11-01 NOTE — Progress Notes (Signed)
OT Cancellation Note  Patient Details Name: [name] MRN: [id number] DOB: 29-Nov-1950   Cancelled Treatment:    Reason Eval/Treat Not Completed: Medical issues which prohibited therapy Pt currently with bed level care only (elevated HR, BP uncontrolled, sepsis) Pt is hospice level care per Chart and RN staff. OT to sign off at this time and await re order if therapy needed for home level care needs.   Fleeta Emmer, OTR/L  Acute Rehabilitation Services Pager: [phone] Office: [phone] .   Jeri [place]ena 11/01/2020, 4:48 PM

## 2020-11-02 DIAGNOSIS — I4892 Unspecified atrial flutter: Secondary | ICD-10-CM | POA: Diagnosis not present

## 2020-11-02 DIAGNOSIS — I5023 Acute on chronic systolic (congestive) heart failure: Secondary | ICD-10-CM | POA: Diagnosis not present

## 2020-11-02 DIAGNOSIS — E876 Hypokalemia: Secondary | ICD-10-CM | POA: Diagnosis not present

## 2020-11-02 DIAGNOSIS — E1165 Type 2 diabetes mellitus with hyperglycemia: Secondary | ICD-10-CM | POA: Diagnosis not present

## 2020-11-02 LAB — BASIC METABOLIC PANEL
Anion gap: 11 (ref 5–15)
BUN: 18 mg/dL (ref 8–23)
CO2: 25 mmol/L (ref 22–32)
Calcium: 7.6 mg/dL — ABNORMAL LOW (ref 8.9–10.3)
Chloride: 111 mmol/L (ref 98–111)
Creatinine, Ser: 0.98 mg/dL (ref 0.61–1.24)
GFR, Estimated: >60 mL/min (ref >60)
Glucose, Bld: 196 mg/dL — ABNORMAL HIGH (ref 70–99)
Potassium: 3.1 mmol/L — ABNORMAL LOW (ref 3.5–5.1)
Sodium: 147 mmol/L — ABNORMAL HIGH (ref 135–145)

## 2020-11-02 LAB — GLUCOSE, CAPILLARY
Glucose-Capillary: 213 mg/dL — ABNORMAL HIGH (ref 70–99)
Glucose-Capillary: 198 mg/dL — ABNORMAL HIGH (ref 70–99)
Glucose-Capillary: 270 mg/dL — ABNORMAL HIGH (ref 70–99)
Glucose-Capillary: 320 mg/dL — ABNORMAL HIGH (ref 70–99)

## 2020-11-02 LAB — MAGNESIUM: Magnesium: 2.1 mg/dL (ref 1.7–2.4)

## 2020-11-02 MED ORDER — POTASSIUM CHLORIDE 20 MEQ PO PACK
40.0000 meq | PACK | Freq: Once | ORAL | Status: AC
  Administered 2020-11-02: 40 meq via ORAL
  Filled 2020-11-02: qty 2

## 2020-11-02 MED ORDER — INSULIN GLARGINE 100 UNIT/ML ~~LOC~~ SOLN
6.0000 [IU] | Freq: Every day | SUBCUTANEOUS | Status: DC
  Administered 2020-11-02 – 2020-11-05 (×4): 6 [IU] via SUBCUTANEOUS
  Filled 2020-11-02 (×5): qty 0.06

## 2020-11-02 MED ORDER — FUROSEMIDE 40 MG PO TABS
40.0000 mg | ORAL_TABLET | Freq: Every day | ORAL | Status: DC
  Administered 2020-11-02 – 2020-11-25 (×22): 40 mg via ORAL
  Filled 2020-11-02 (×23): qty 1

## 2020-11-02 MED ORDER — POTASSIUM CHLORIDE 20 MEQ PO PACK
40.0000 meq | PACK | Freq: Two times a day (BID) | ORAL | Status: AC
  Administered 2020-11-03 (×2): 40 meq via ORAL
  Filled 2020-11-02 (×3): qty 2

## 2020-11-02 NOTE — Plan of Care (Signed)
[name] is progressing today showcasing his ability to speak clearly, engage in conversation better than before and following commands. [name] is voiding and having bowel movements appropriately today. Non violent restraints have been discontinued and mitts are being assessed to be removed.
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
## 2020-11-03 DIAGNOSIS — E876 Hypokalemia: Secondary | ICD-10-CM | POA: Diagnosis not present

## 2020-11-03 DIAGNOSIS — I5023 Acute on chronic systolic (congestive) heart failure: Secondary | ICD-10-CM | POA: Diagnosis not present

## 2020-11-03 DIAGNOSIS — I4892 Unspecified atrial flutter: Secondary | ICD-10-CM | POA: Diagnosis not present

## 2020-11-03 DIAGNOSIS — E1165 Type 2 diabetes mellitus with hyperglycemia: Secondary | ICD-10-CM | POA: Diagnosis not present

## 2020-11-03 LAB — CBC
HCT: 35.7 % — ABNORMAL LOW (ref 39.0–52.0)
Hemoglobin: 11.3 g/dL — ABNORMAL LOW (ref 13.0–17.0)
MCH: 29.9 pg (ref 26.0–34.0)
MCHC: 31.7 g/dL (ref 30.0–36.0)
MCV: 94.4 fL (ref 80.0–100.0)
Platelets: 212 10*3/uL (ref 150–400)
RBC: 3.78 MIL/uL — ABNORMAL LOW (ref 4.22–5.81)
RDW: 17.9 % — ABNORMAL HIGH (ref 11.5–15.5)
WBC: 18.8 10*3/uL — ABNORMAL HIGH (ref 4.0–10.5)
nRBC: 0 % (ref 0.0–0.2)

## 2020-11-03 LAB — GLUCOSE, CAPILLARY
Glucose-Capillary: 200 mg/dL — ABNORMAL HIGH (ref 70–99)
Glucose-Capillary: 192 mg/dL — ABNORMAL HIGH (ref 70–99)
Glucose-Capillary: 223 mg/dL — ABNORMAL HIGH (ref 70–99)

## 2020-11-03 LAB — BASIC METABOLIC PANEL
Anion gap: 10 (ref 5–15)
BUN: 11 mg/dL (ref 8–23)
CO2: 24 mmol/L (ref 22–32)
Calcium: 7.5 mg/dL — ABNORMAL LOW (ref 8.9–10.3)
Chloride: 105 mmol/L (ref 98–111)
Creatinine, Ser: 0.95 mg/dL (ref 0.61–1.24)
GFR, Estimated: >60 mL/min (ref >60)
Glucose, Bld: 216 mg/dL — ABNORMAL HIGH (ref 70–99)
Potassium: 3 mmol/L — ABNORMAL LOW (ref 3.5–5.1)
Sodium: 139 mmol/L (ref 135–145)

## 2020-11-03 MED ORDER — POTASSIUM CHLORIDE 20 MEQ PO PACK
40.0000 meq | PACK | Freq: Once | ORAL | Status: AC
  Administered 2020-11-03: 40 meq via ORAL
  Filled 2020-11-03: qty 2

## 2020-11-03 MED ORDER — METRONIDAZOLE 500 MG PO TABS
500.0000 mg | ORAL_TABLET | Freq: Three times a day (TID) | ORAL | Status: DC
  Administered 2020-11-03 – 2020-11-13 (×29): 500 mg via ORAL
  Filled 2020-11-03 (×29): qty 1

## 2020-11-03 MED ORDER — LEVALBUTEROL HCL 1.25 MG/0.5ML IN NEBU
1.2500 mg | INHALATION_SOLUTION | Freq: Four times a day (QID) | RESPIRATORY_TRACT | Status: DC | PRN
  Filled 2020-11-03: qty 0.5

## 2020-11-03 MED ORDER — AMIODARONE HCL 200 MG PO TABS
200.0000 mg | ORAL_TABLET | Freq: Every day | ORAL | Status: DC
  Administered 2020-11-03 – 2020-11-29 (×26): 200 mg via ORAL
  Filled 2020-11-03 (×26): qty 1

## 2020-11-03 MED ORDER — SULFAMETHOXAZOLE-TRIMETHOPRIM 800-160 MG PO TABS
1.0000 | ORAL_TABLET | Freq: Two times a day (BID) | ORAL | Status: DC
  Administered 2020-11-03 – 2020-11-06 (×7): 1 via ORAL
  Filled 2020-11-03 (×9): qty 1
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
## 2020-11-03 NOTE — Progress Notes (Signed)
Pt has had multiple liquid and mucoid stools. Skin in peri area and around rectum is red, raw, and sore. Skin intact. Provider Jeannette Corpus, NP notified and cdiff sample and rectal tube suggested. Order for rectal tube insertion placed by provider. Pt incontinent of stool, peri care and complete linen change performed and then rectal tube inserted at this time. Pt tolerated well with minimal complaints of discomfort. Will continue to monitor

## 2020-11-04 ENCOUNTER — Telehealth: Payer: Self-pay | Admitting: Cardiology

## 2020-11-04 DIAGNOSIS — Z515 Encounter for palliative care: Secondary | ICD-10-CM | POA: Diagnosis not present

## 2020-11-04 DIAGNOSIS — A419 Sepsis, unspecified organism: Secondary | ICD-10-CM | POA: Diagnosis not present

## 2020-11-04 DIAGNOSIS — Z7189 Other specified counseling: Secondary | ICD-10-CM | POA: Diagnosis not present

## 2020-11-04 LAB — BASIC METABOLIC PANEL
Anion gap: 6 (ref 5–15)
BUN: 11 mg/dL (ref 8–23)
CO2: 25 mmol/L (ref 22–32)
Calcium: 7.7 mg/dL — ABNORMAL LOW (ref 8.9–10.3)
Chloride: 109 mmol/L (ref 98–111)
Creatinine, Ser: 0.96 mg/dL (ref 0.61–1.24)
GFR, Estimated: >60 mL/min (ref >60)
Glucose, Bld: 181 mg/dL — ABNORMAL HIGH (ref 70–99)
Potassium: 3.8 mmol/L (ref 3.5–5.1)
Sodium: 140 mmol/L (ref 135–145)

## 2020-11-04 LAB — GLUCOSE, CAPILLARY
Glucose-Capillary: 164 mg/dL — ABNORMAL HIGH (ref 70–99)
Glucose-Capillary: 159 mg/dL — ABNORMAL HIGH (ref 70–99)
Glucose-Capillary: 154 mg/dL — ABNORMAL HIGH (ref 70–99)
Glucose-Capillary: 218 mg/dL — ABNORMAL HIGH (ref 70–99)
Glucose-Capillary: 137 mg/dL — ABNORMAL HIGH (ref 70–99)

## 2020-11-04 LAB — CBC
HCT: 34.2 % — ABNORMAL LOW (ref 39.0–52.0)
Hemoglobin: 11.4 g/dL — ABNORMAL LOW (ref 13.0–17.0)
MCH: 31.3 pg (ref 26.0–34.0)
MCHC: 33.3 g/dL (ref 30.0–36.0)
MCV: 94 fL (ref 80.0–100.0)
Platelets: 204 10*3/uL (ref 150–400)
RBC: 3.64 MIL/uL — ABNORMAL LOW (ref 4.22–5.81)
RDW: 17.9 % — ABNORMAL HIGH (ref 11.5–15.5)
WBC: 16.7 10*3/uL — ABNORMAL HIGH (ref 4.0–10.5)
nRBC: 0 % (ref 0.0–0.2)

## 2020-11-04 LAB — MAGNESIUM: Magnesium: 1.9 mg/dL (ref 1.7–2.4)

## 2020-11-04 MED ORDER — GERHARDT'S BUTT CREAM
TOPICAL_CREAM | Freq: Four times a day (QID) | CUTANEOUS | Status: DC
  Administered 2020-11-08 – 2020-11-20 (×10): 1 via TOPICAL
  Filled 2020-11-04 (×4): qty 1

## 2020-11-04 NOTE — Progress Notes (Addendum)
PROGRESS NOTE    [name]  [id number] DOB: Jul 04, 1951 DOA: 10/25/2020 PCP: Leeanne Rio, MD   Chief Complaint  Patient presents with  . Foot Pain  . Foot Ulcer  Brief Narrative: 70 year old male with hypertension HLD, T2DM, a flutter, CAD, history of a stroke with left hemiparesis admitted with left foot wound from recent trauma.  Patient Dr Marc Morgans for antibiotics at [facility] vascular surgery was consulted and transferred to [facility] 1/17-but patient refused amputation. Patient had complicated hospitalization with acute hypoxic respiratory failure acute on chronic systolic CHF.  Seen by cardiology and vascular.  Palliative care was consulted.  Subjective: Seen and examined this morning. Patient resting complaints of pain on bilateral legs Afebrile overnight T-max 90.7.  91 to 93% on room air heart rate in 80s to 110s Cbc with WBC improving.  Assessment & Plan:  Severe sepsis POA due to left foot osteomyelitis/left foot osteomyelitis: Seen by vascular Dr Oneida Alar on  10/31/20 advised AKA lt- patient/wife refused surgery and he is being managed with broad-spectrum antibiotic vancomycin cefepime Flagyl initially and now PO bactrim and Flagyl.  Vascular surgery signed off.  Blood culture no growth, MRSA screen negative.  Palliative care was consulted for goals of care discussion.  Has leukocytosis but no fever. C/o leg pain. Recent Labs  Lab 10/29/20 0406 10/30/20 0451 10/31/20 0400 11/01/20 0317 11/03/20 0605 11/04/20 1031  WBC 21.6* 19.2* 19.7*  --  18.8* 16.7*  LATICACIDVEN  --   --   --  1.5  --   --    Acute on chronic systolic CHF chest x-ray pneumonia versus acute CHF Echo with EF 25 to 30%-which is new, previously 50% 2019, seen by cardiology, and has signed off available as needed.  Continue on Lasix, metoprolol.  Cardiology mentioned that patient will need ischemic evaluation given the new reduction in EF.  Patient's wife requesting cardiology  evaluation as she is considering amputation although patient has refused and I have sent messages to cardiology team regarding the saem. Palliative care meeting with family today to discuss more.  Acute hypoxic respiratory failure resolved,on RA  PAF/a flutter with RVR seen by cardiology, rate controlled, on therapeutic Lovenox, amiodarone, toprol.  Monitor.  Insulin-dependent type 2 diabetes mellitus, HbA1c 6.9.  Fairly controlled on Lantus 6 units at bedtime, sliding scale insulin Recent Labs  Lab 11/03/20 0736 11/03/20 1135 11/03/20 1505 11/03/20 2144 11/04/20 0730  GLUCAP 200* 164* 192* 223* 159*   Lab Results  Component Value Date   HGBA1C 6.9 (H) 10/25/2020   Hypokalemia/magnesium mag and potassium are stable.  CAD multivessel with history of CABG  12/2013/ hyperlipidemia on a statin, Plavix.  Seen by cardiology and has signed off.  CKD stage II, stable renal function Recent Labs  Lab 10/31/20 0400 11/01/20 0317 11/02/20 0435 11/03/20 0605 11/04/20 0449  BUN 25* 20 18 11 11   CREATININE 1.17 1.08 0.98 0.95 0.96   history of CVA with left hemiparesis/dementia on Aricept, statin, plavix  Essential hypertension: BP is controlled.  On metoprolol Lasix  GERD-ppi  BPH-monitor voiding continue finasteride  PAD cont statins, palvis  Goals of care, counseling/discussion: Palliative care is involved, patient is DNR.  Awaiting for family meeting today.  Morbid obesity with body mass index is 38.09 kg/m.  PCP follow-up weight loss  Nutrition: Diet Order            DIET DYS 2 Room service appropriate? Yes; Fluid consistency: Thin  Diet effective now  DVT prophylaxis: SCDs Start: 10/25/20 2246 Code Status:   Code Status: DNR  Family Communication: plan of care discussed with patient at bedside.  Status is: Inpatient Remains inpatient appropriate because:Unsafe d/c plan and Inpatient level of care appropriate due to severity of illness  Dispo: The  patient is from: Home              Anticipated d/c is to: TBD              Anticipated d/c date is: 2 days              Patient currently is not medically stable to d/c.   Difficult to place patient Yes  Consultants:see note  Procedures:see note  Culture/Microbiology    Component Value Date/Time   SDES  10/25/2020 2210    IN/OUT CATH URINE Performed at [facility], [address], [place], [place] [address]    [facility]  10/25/2020 2210    NONE Performed at [facility], [address] [place], [place] [address]    CULT 40,000 COLONIES/mL ENTEROBACTER AEROGENES (A) 10/25/2020 2210   REPTSTATUS 10/28/2020 FINAL 10/25/2020 2210    Other culture-see note  Medications: Scheduled Meds: . amiodarone  200 mg Oral Daily  . atorvastatin  40 mg Oral Daily  . baclofen  5 mg Oral TID  . Chlorhexidine Gluconate Cloth  6 each Topical Daily  . clopidogrel  75 mg Oral Daily  . donepezil  5 mg Oral QHS  . enoxaparin (LOVENOX) injection  110 mg Subcutaneous BID  . feeding supplement (GLUCERNA SHAKE)  237 mL Oral TID BM  . finasteride  5 mg Oral Q1200  . furosemide  40 mg Oral Daily  . insulin aspart  0-15 Units Subcutaneous TID WC  . insulin aspart  0-5 Units Subcutaneous QHS  . insulin glargine  6 Units Subcutaneous QHS  . memantine  5 mg Oral BID  . metoprolol succinate  25 mg Oral Daily  . metroNIDAZOLE  500 mg Oral Q8H  . pantoprazole  40 mg Oral Daily  . sodium chloride flush  10-40 mL Intracatheter Q12H  . sulfamethoxazole-trimethoprim  1 tablet Oral Q12H   Continuous Infusions:  Antimicrobials: Anti-infectives (From admission, onward)   Start     Dose/Rate Route Frequency Ordered Stop   11/03/20 2200  metroNIDAZOLE (FLAGYL) tablet 500 mg        500 mg Oral Every 8 hours 11/03/20 1554     11/03/20 2000  sulfamethoxazole-trimethoprim (BACTRIM DS) 800-160 MG per tablet 1 tablet        1 tablet Oral Every 12 hours 11/03/20 1554     11/01/20 0915  cefTRIAXone (ROCEPHIN) 2 g  in sodium chloride 0.9 % 100 mL IVPB  Status:  Discontinued        2 g 200 mL/hr over 30 Minutes Intravenous Every 24 hours 11/01/20 0816 11/03/20 1551   10/29/20 1400  vancomycin (VANCOREADY) IVPB 750 mg/150 mL  Status:  Discontinued        750 mg 150 mL/hr over 60 Minutes Intravenous Every 12 hours 10/29/20 1238 11/01/20 0816   10/28/20 1400  vancomycin (VANCOCIN) IVPB 1000 mg/200 mL premix  Status:  Discontinued        1,000 mg 200 mL/hr over 60 Minutes Intravenous Every 12 hours 10/28/20 1344 10/29/20 1238   10/28/20 0900  metroNIDAZOLE (FLAGYL) IVPB 500 mg  Status:  Discontinued        500 mg 100 mL/hr over 60 Minutes Intravenous Every  8 hours 10/28/20 0808 11/03/20 1551   10/26/20 1000  vancomycin (VANCOCIN) IVPB 1000 mg/200 mL premix  Status:  Discontinued        1,000 mg 200 mL/hr over 60 Minutes Intravenous Every 12 hours 10/25/20 2037 10/28/20 1344   10/25/20 2200  ceFEPIme (MAXIPIME) 2 g in sodium chloride 0.9 % 100 mL IVPB  Status:  Discontinued        2 g 200 mL/hr over 30 Minutes Intravenous Every 8 hours 10/25/20 2037 11/01/20 0816   10/25/20 2030  vancomycin (VANCOREADY) IVPB 2000 mg/400 mL        2,000 mg 200 mL/hr over 120 Minutes Intravenous NOW 10/25/20 2027 10/25/20 2248   10/25/20 1930  vancomycin (VANCOCIN) IVPB 1000 mg/200 mL premix  Status:  Discontinued        1,000 mg 200 mL/hr over 60 Minutes Intravenous  Once 10/25/20 1928 10/25/20 2136   10/25/20 1930  cefTRIAXone (ROCEPHIN) 2 g in sodium chloride 0.9 % 100 mL IVPB        2 g 200 mL/hr over 30 Minutes Intravenous  Once 10/25/20 1928 10/25/20 2040     Objective: Vitals: Today's Vitals   11/04/20 0330 11/04/20 0400 11/04/20 0700 11/04/20 0731  BP: 106/75 106/75 118/80   Pulse: (!) 108 (!) 110    Resp: 20 16    Temp:   98.4 F (36.9 C)   TempSrc:   Oral   SpO2: 91% 93%    Weight:      Height:      PainSc:    10-Worst pain ever    Intake/Output Summary (Last 24 hours) at 11/04/2020 1144 Last data  filed at 11/04/2020 1000 Gross per 24 hour  Intake 680 ml  Output 776 ml  Net -96 ml   Filed Weights   10/25/20 1824 10/26/20 1850 10/27/20 0500  Weight: 116.1 kg 110.5 kg 110.3 kg   Weight change:   Intake/Output from previous day: 01/23 0701 - 01/24 0700 In: 720 [P.O.:520; IV Piggyback:200] Out: 1778 [Urine:1775; Stool:3] Intake/Output this shift: Total I/O In: 120 [P.O.:120] Out: 250 [Urine:250] Filed Weights   10/25/20 1824 10/26/20 1850 10/27/20 0500  Weight: 116.1 kg 110.5 kg 110.3 kg    Examination: General exam: AAOX2 ,NAD, weak appearing. HEENT:Oral mucosa moist, Ear/Nose WNL grossly,dentition normal. Respiratory system: bilaterally diminished breath sound,no wheezing or crackles,no use of accessory muscle, non tender. Cardiovascular system: S1 & S2 +, regular, No JVD. Gastrointestinal system: Abdomen soft, NT,ND, BS+. Nervous System:Alert, awake, moving extremities and grossly nonfocal Extremities: No edema, cold bilaterally peripherally.  Skin bruise healing on left knee Skin: No rashes,no icterus. MSK: Normal muscle bulk,tone, power   Data Reviewed: I have personally reviewed following labs and imaging studies CBC: Recent Labs  Lab 10/29/20 0406 10/30/20 0451 10/31/20 0400 11/03/20 0605 11/04/20 1031  WBC 21.6* 19.2* 19.7* 18.8* 16.7*  HGB 11.0* 10.8* 11.5* 11.3* 11.4*  HCT 33.4* 34.7* 34.3* 35.7* 34.2*  MCV 93.8 96.1 93.2 94.4 94.0  PLT 270 239 257 212 0000000   Basic Metabolic Panel: Recent Labs  Lab 10/31/20 0400 11/01/20 0317 11/02/20 0435 11/03/20 0605 11/04/20 0449  NA 143 145 147* 139 140  K 2.9* 3.2* 3.1* 3.0* 3.8  CL 107 111 111 105 109  CO2 24 24 25 24 25   GLUCOSE 197* 199* 196* 216* 181*  BUN 25* 20 18 11 11   CREATININE 1.17 1.08 0.98 0.95 0.96  CALCIUM 7.5* 7.5* 7.6* 7.5* 7.7*  MG 1.9 1.8 2.1  --  1.9   GFR: Estimated Creatinine Clearance: 87.3 mL/min (by C-G formula based on SCr of 0.96 mg/dL). Liver Function Tests: No  results for input(s): AST, ALT, ALKPHOS, BILITOT, PROT, ALBUMIN in the last 168 hours. No results for input(s): LIPASE, AMYLASE in the last 168 hours. No results for input(s): AMMONIA in the last 168 hours. Coagulation Profile: No results for input(s): INR, PROTIME in the last 168 hours. Cardiac Enzymes: No results for input(s): CKTOTAL, CKMB, CKMBINDEX, TROPONINI in the last 168 hours. BNP (last 3 results) No results for input(s): PROBNP in the last 8760 hours. HbA1C: No results for input(s): HGBA1C in the last 72 hours. CBG: Recent Labs  Lab 11/03/20 0736 11/03/20 1135 11/03/20 1505 11/03/20 2144 11/04/20 0730  GLUCAP 200* 164* 192* 223* 159*   Lipid Profile: No results for input(s): CHOL, HDL, LDLCALC, TRIG, CHOLHDL, LDLDIRECT in the last 72 hours. Thyroid Function Tests: No results for input(s): TSH, T4TOTAL, FREET4, T3FREE, THYROIDAB in the last 72 hours. Anemia Panel: No results for input(s): VITAMINB12, FOLATE, FERRITIN, TIBC, IRON, RETICCTPCT in the last 72 hours. Sepsis Labs: Recent Labs  Lab 11/01/20 9518  LATICACIDVEN 1.5    Recent Results (from the past 240 hour(s))  Blood culture (routine single)     Status: None   Collection Time: 10/25/20  6:35 PM   Specimen: Right Antecubital; Blood  Result Value Ref Range Status   Specimen Description   Final    RIGHT ANTECUBITAL BOTTLES DRAWN AEROBIC AND ANAEROBIC Blood Culture adequate volume   Special Requests NONE  Final   Culture   Final    NO GROWTH 5 DAYS Performed at [facility], [address], [place], [place] [address]    Report Status 10/30/2020 FINAL  Final  Resp Panel by RT-PCR (Flu A&B, Covid) Nasopharyngeal Swab     Status: None   Collection Time: 10/25/20  6:37 PM   Specimen: Nasopharyngeal Swab; Nasopharyngeal(NP) swabs in vial transport medium  Result Value Ref Range Status   SARS Coronavirus 2 by RT PCR NEGATIVE NEGATIVE Final    Comment: (NOTE) SARS-CoV-2 target nucleic acids are NOT  DETECTED.  The SARS-CoV-2 RNA is generally detectable in upper respiratory specimens during the acute phase of infection. The lowest concentration of SARS-CoV-2 viral copies this assay can detect is 138 copies/mL. A negative result does not preclude SARS-Cov-2 infection and should not be used as the sole basis for treatment or other patient management decisions. A negative result may occur with  improper specimen collection/handling, submission of specimen other than nasopharyngeal swab, presence of viral mutation(s) within the areas targeted by this assay, and inadequate number of viral copies(<138 copies/mL). A negative result must be combined with clinical observations, patient history, and epidemiological information. The expected result is Negative.  Fact Sheet for Patients:  EntrepreneurPulse.com.au  Fact Sheet for Healthcare Providers:  IncredibleEmployment.be  This test is no t yet approved or cleared by the [place] FDA and  has been authorized for detection and/or diagnosis of SARS-CoV-2 by FDA under an Emergency Use Authorization (EUA). This EUA will remain  in effect (meaning this test can be used) for the duration of the COVID-19 declaration under Section 564(b)(1) of the Act, 21 U.S.C.section 360bbb-3(b)(1), unless the authorization is terminated  or revoked sooner.       Influenza A by PCR NEGATIVE NEGATIVE Final   Influenza B by PCR NEGATIVE NEGATIVE Final    Comment: (NOTE) The Xpert Xpress SARS-CoV-2/FLU/RSV plus assay is intended as an aid in the  diagnosis of influenza from Nasopharyngeal swab specimens and should not be used as a sole basis for treatment. Nasal washings and aspirates are unacceptable for Xpert Xpress SARS-CoV-2/FLU/RSV testing.  Fact Sheet for Patients: EntrepreneurPulse.com.au  Fact Sheet for Healthcare Providers: IncredibleEmployment.be  This test is not yet  approved or cleared by the [place] FDA and has been authorized for detection and/or diagnosis of SARS-CoV-2 by FDA under an Emergency Use Authorization (EUA). This EUA will remain in effect (meaning this test can be used) for the duration of the COVID-19 declaration under Section 564(b)(1) of the Act, 21 U.S.C. section 360bbb-3(b)(1), unless the authorization is terminated or revoked.  Performed at [facility], [address], [place], [place] [address]   Urine culture     Status: Abnormal   Collection Time: 10/25/20 10:10 PM   Specimen: In/Out Cath Urine  Result Value Ref Range Status   Specimen Description   Final    IN/OUT CATH URINE Performed at [facility], [address] [place], [place] [address]    Special Requests   Final    NONE Performed at [facility], [address], [place], [place] [address]    Culture 40,000 COLONIES/mL ENTEROBACTER AEROGENES (A)  Final   Report Status 10/28/2020 FINAL  Final   Organism ID, Bacteria ENTEROBACTER AEROGENES (A)  Final      Susceptibility   Enterobacter aerogenes - MIC*    CEFAZOLIN >=64 RESISTANT Resistant     CEFEPIME <=0.12 SENSITIVE Sensitive     CEFTRIAXONE <=0.25 SENSITIVE Sensitive     CIPROFLOXACIN <=0.25 SENSITIVE Sensitive     GENTAMICIN <=1 SENSITIVE Sensitive     IMIPENEM 1 SENSITIVE Sensitive     NITROFURANTOIN 64 INTERMEDIATE Intermediate     TRIMETH/SULFA <=20 SENSITIVE Sensitive     PIP/TAZO <=4 SENSITIVE Sensitive     * 40,000 COLONIES/mL ENTEROBACTER AEROGENES  MRSA PCR Screening     Status: None   Collection Time: 10/26/20  6:42 PM   Specimen: Nasal Mucosa; Nasopharyngeal  Result Value Ref Range Status   MRSA by PCR NEGATIVE NEGATIVE Final    Comment:        The GeneXpert MRSA Assay (FDA approved for NASAL specimens only), is one component of a comprehensive MRSA colonization surveillance program. It is not intended to diagnose MRSA infection nor to guide or monitor treatment for MRSA  infections. Performed at [facility], [address] [place], [place] [address]      Radiology Studies: No results found.   LOS: 10 days   Antonieta Pert, MD [facility]  11/04/2020, 11:44 AM
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
## 2020-11-04 NOTE — Telephone Encounter (Signed)
Patient's wife calling on behalf of patient. She states the patient is currently admitted to [facility] with an infection in his toe that has spread up his leg. She states the doctors are saying he needs his leg amputated, but that he may not survive being on the table because his heart is not strong. She states they say he won't survive if they don't amputate it. She wants to know if Dr. Stanford Breed can take a look at the patient's heart as that is his regular cardiologist. She states the hospital is awaiting her decision in order to perform the surgery but she wants to speak with Dr. Stanford Breed first. Please call/advise   Thank you!

## 2020-11-04 NOTE — Telephone Encounter (Signed)
Spoke with pt wife and son, aware dr Stanford Breed agrees with the patient needing surgery. Aware if he continues to have infection that it will eventually get in his blood and cause more problems. They voiced understanding.

## 2020-11-05 ENCOUNTER — Other Ambulatory Visit: Payer: Self-pay

## 2020-11-05 DIAGNOSIS — I5043 Acute on chronic combined systolic (congestive) and diastolic (congestive) heart failure: Secondary | ICD-10-CM

## 2020-11-05 DIAGNOSIS — I6523 Occlusion and stenosis of bilateral carotid arteries: Secondary | ICD-10-CM

## 2020-11-05 DIAGNOSIS — I739 Peripheral vascular disease, unspecified: Secondary | ICD-10-CM

## 2020-11-05 DIAGNOSIS — M86172 Other acute osteomyelitis, left ankle and foot: Secondary | ICD-10-CM | POA: Diagnosis not present

## 2020-11-05 LAB — GLUCOSE, CAPILLARY
Glucose-Capillary: 124 mg/dL — ABNORMAL HIGH (ref 70–99)
Glucose-Capillary: 171 mg/dL — ABNORMAL HIGH (ref 70–99)
Glucose-Capillary: 229 mg/dL — ABNORMAL HIGH (ref 70–99)
Glucose-Capillary: 273 mg/dL — ABNORMAL HIGH (ref 70–99)

## 2020-11-05 LAB — BASIC METABOLIC PANEL
Anion gap: 9 (ref 5–15)
BUN: 12 mg/dL (ref 8–23)
CO2: 24 mmol/L (ref 22–32)
Calcium: 7.7 mg/dL — ABNORMAL LOW (ref 8.9–10.3)
Chloride: 105 mmol/L (ref 98–111)
Creatinine, Ser: 0.94 mg/dL (ref 0.61–1.24)
GFR, Estimated: >60 mL/min (ref >60)
Glucose, Bld: 130 mg/dL — ABNORMAL HIGH (ref 70–99)
Potassium: 3.6 mmol/L (ref 3.5–5.1)
Sodium: 138 mmol/L (ref 135–145)

## 2020-11-05 LAB — CBC
HCT: 34.7 % — ABNORMAL LOW (ref 39.0–52.0)
Hemoglobin: 11.1 g/dL — ABNORMAL LOW (ref 13.0–17.0)
MCH: 30.5 pg (ref 26.0–34.0)
MCHC: 32 g/dL (ref 30.0–36.0)
MCV: 95.3 fL (ref 80.0–100.0)
Platelets: 193 10*3/uL (ref 150–400)
RBC: 3.64 MIL/uL — ABNORMAL LOW (ref 4.22–5.81)
RDW: 18.2 % — ABNORMAL HIGH (ref 11.5–15.5)
WBC: 18.1 10*3/uL — ABNORMAL HIGH (ref 4.0–10.5)
nRBC: 0 % (ref 0.0–0.2)

## 2020-11-05 LAB — TROPONIN I (HIGH SENSITIVITY): Troponin I (High Sensitivity): 43 ng/L — ABNORMAL HIGH (ref <18)

## 2020-11-05 MED ORDER — METOPROLOL TARTRATE 5 MG/5ML IV SOLN: 2.5000 mg | Freq: Once | INTRAVENOUS | Status: DC

## 2020-11-05 MED ORDER — MAGNESIUM SULFATE IN D5W 1-5 GM/100ML-% IV SOLN
1.0000 g | Freq: Once | INTRAVENOUS | Status: AC
  Administered 2020-11-06: 1 g via INTRAVENOUS
  Filled 2020-11-05: qty 100

## 2020-11-05 MED ORDER — POTASSIUM CHLORIDE CRYS ER 20 MEQ PO TBCR
40.0000 meq | EXTENDED_RELEASE_TABLET | Freq: Once | ORAL | Status: AC
  Administered 2020-11-06: 40 meq via ORAL
  Filled 2020-11-05: qty 2

## 2020-11-05 MED ORDER — METOPROLOL TARTRATE 5 MG/5ML IV SOLN
2.5000 mg | Freq: Once | INTRAVENOUS | Status: AC
  Administered 2020-11-05: 2.5 mg via INTRAVENOUS
  Filled 2020-11-05: qty 5

## 2020-11-05 NOTE — Consult Note (Addendum)
Progress Note  Patient Name: [name] Date of Encounter: 11/05/2020  [facility] Cardiologist: Kirk Ruths, MD   Subjective   Cardiology asked to see again as patient/wife now agreeable to AKA. He is sitting up in bed. Only complains of foot pain.   Inpatient Medications    Scheduled Meds: . amiodarone  200 mg Oral Daily  . atorvastatin  40 mg Oral Daily  . baclofen  5 mg Oral TID  . Chlorhexidine Gluconate Cloth  6 each Topical Daily  . clopidogrel  75 mg Oral Daily  . donepezil  5 mg Oral QHS  . enoxaparin (LOVENOX) injection  110 mg Subcutaneous BID  . feeding supplement (GLUCERNA SHAKE)  237 mL Oral TID BM  . finasteride  5 mg Oral Q1200  . furosemide  40 mg Oral Daily  . Gerhardt's butt cream   Topical QID  . insulin aspart  0-15 Units Subcutaneous TID WC  . insulin aspart  0-5 Units Subcutaneous QHS  . insulin glargine  6 Units Subcutaneous QHS  . memantine  5 mg Oral BID  . metoprolol succinate  25 mg Oral Daily  . metroNIDAZOLE  500 mg Oral Q8H  . pantoprazole  40 mg Oral Daily  . sodium chloride flush  10-40 mL Intracatheter Q12H  . sulfamethoxazole-trimethoprim  1 tablet Oral Q12H   Continuous Infusions:  PRN Meds: acetaminophen, haloperidol lactate, levalbuterol, ondansetron (ZOFRAN) IV, oxyCODONE-acetaminophen, sodium chloride flush   Vital Signs    Vitals:   11/04/20 2008 11/05/20 0000 11/05/20 0330 11/05/20 0712  BP: 102/68 115/64 127/88 123/81  Pulse: (!) 110 (!) 109 (!) 108 (!) 104  Resp: 16 14 18 20   Temp:   98.2 F (36.8 C) 99.2 F (37.3 C)  TempSrc:   Oral   SpO2: 96% 96% 97% 97%  Weight:      Height:        Intake/Output Summary (Last 24 hours) at 11/05/2020 1040 Last data filed at 11/05/2020 0900 Gross per 24 hour  Intake 240 ml  Output --  Net 240 ml   Last 3 Weights 10/27/2020 10/26/2020 10/25/2020  Weight (lbs) 243 lb 2.7 oz 243 lb 9.7 oz 256 lb  Weight (kg) 110.3 kg 110.5 kg 116.121 kg      Telemetry    Atrial  flutter rates 100s - Personally Reviewed  ECG    No new tracing.   Physical Exam   GEN: No acute distress.   Neck: No JVD Cardiac: Tachycardic, soft systolic murmur, no rubs, or gallops.  Respiratory: Clear to auscultation bilaterally. GI: Soft, nontender, non-distended  MS: No edema. Left heel covered with dressing.  Neuro:  Nonfocal, alert to self and knows why he's in the hospital Psych: Normal affect   Labs    High Sensitivity Troponin:  No results for input(s): TROPONINIHS in the last 720 hours.    Chemistry Recent Labs  Lab 11/03/20 0605 11/04/20 0449 11/05/20 0510  NA 139 140 138  K 3.0* 3.8 3.6  CL 105 109 105  CO2 24 25 24   GLUCOSE 216* 181* 130*  BUN 11 11 12   CREATININE 0.95 0.96 0.94  CALCIUM 7.5* 7.7* 7.7*  GFRNONAA >60 >60 >60  ANIONGAP 10 6 9      Hematology Recent Labs  Lab 11/03/20 0605 11/04/20 1031 11/05/20 0510  WBC 18.8* 16.7* 18.1*  RBC 3.78* 3.64* 3.64*  HGB 11.3* 11.4* 11.1*  HCT 35.7* 34.2* 34.7*  MCV 94.4 94.0 95.3  MCH 29.9 31.3  30.5  MCHC 31.7 33.3 32.0  RDW 17.9* 17.9* 18.2*  PLT 212 204 193    BNPNo results for input(s): BNP, PROBNP in the last 168 hours.   DDimer No results for input(s): DDIMER in the last 168 hours.   Radiology    No results found.  Cardiac Studies   Echocardiogram 10/29/20: 1. Left ventricular ejection fraction, by estimation, is 25 to 30%. The  left ventricle has severely decreased function. The left ventricle  demonstrates global hypokinesis. The left ventricular internal cavity size  was mildly dilated. There is mild left  ventricular hypertrophy. Left ventricular diastolic parameters are  indeterminate.  2. Right ventricular systolic function is normal. The right ventricular  size is normal.  3. Left atrial size was mildly dilated.  4. The mitral valve is normal in structure. Mild mitral valve  regurgitation. No evidence of mitral stenosis.  5. The aortic valve is tricuspid. Aortic  valve regurgitation is not  visualized. Mild aortic valve sclerosis is present, with no evidence of  aortic valve stenosis.  6. The inferior vena cava is normal in size with greater than 50%  respiratory variability, suggesting right atrial pressure of 3 mmHg.   Patient Profile     70 y.o. male with a PMH of CAD s/p CABG in 2014, chronic combined CHF, paroxysmal atrial flutter, carotid artery disease with known right sided occlusion and moderate left stenosis, PVD, HTN, HLD, DM type 2, and CVA with left sided hemiparesis, who was initially seen for CHF and atrial flutter. Refused surgery and made a DNR, now agreeable to surgery and asked to see for pre op assessment.   Assessment & Plan    1. Chronic combined CHF: he has hx of known ICM. EF had improved to 50% but now back down to 25-30% on echo this admission. He was diuresed with IV lasix, now transitioned to 40mg  daily PO. He was started on Toprol XL 25mg  daily and tolerating well. Blood pressures are stable, consider addition of ARB/spiro prior to discharge if renal function stable post op. With this decline in his EF he is clearly at higher for surgery, though not prohibitive. Would not consider him a good candidate for invasive work up at this time given acute state, and confusion. His labs have improved and now appears volume stable on exam.  2. Severe sepsis 2/2 left foot osteomyelitis: seen by [facility] with recommendations for left AKA. He has been unable to make decisions this admission therefore wife has been communicating with staff. Initially they refused to under amputation but now are agreeable. Dr. Oneida Alar now following again.   3. Atrial flutter: initially placed on IV amiodarone, now transitioned to oral 200mg  daily.  On therapeutic lovenox. This patients CHA2DS2-VASc Score of 8. Will need Mackey in the future once safe from a surgical standpoint.  -- rates are in the low 100s. With acute illness, would continue with rate controlling  strategy at this time.  -- continue Toprol  4. CKD stage II: Cr stable at 0.94 today.  For questions or updates, please contact [facility] Please consult [email or web address] for contact info under        Signed, Reino Bellis, NP  11/05/2020, 10:40 AM    I have personally seen and examined this patient. I agree with the assessment and plan as outlined above.  He has an ischemic cardiomyopathy. LVEF=25-30%. He has been diuresed. No evidence of volume overload on exam. Heart rates are controlled.  He is at increased risk  for his planned surgical procedure given his cardiac disease, however, this risk is not prohibitive. I would proceed with the planned procedure without further cardiac workup.  We will follow along with you.   Lauree Chandler 11/05/2020 11:08 AM
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
## 2020-11-05 NOTE — Progress Notes (Signed)
Patient with current ST elevation. Vitals: HR: 110 BP: 142/86 RR: 16 Temp: 98.2 MAP: 102  Patient in no acute distress. On call paged and made aware.

## 2020-11-05 NOTE — Progress Notes (Signed)
PROGRESS NOTE    [name] KLOOS  YTK:[id number] DOB: 03/11/1951 DOA: 10/25/2020 PCP: Leeanne Rio, MD   Chief Complaint  Patient presents with  . Foot Pain  . Foot Ulcer  Brief Narrative: 70 year old male with hypertension HLD, T2DM, a flutter, CAD, history of a stroke with left hemiparesis admitted with left foot wound from recent trauma.  Patient Dr Marc Morgans for antibiotics at [facility] vascular surgery was consulted and transferred to [facility] 1/17-but patient refused amputation. Patient had complicated hospitalization with acute hypoxic respiratory failure acute on chronic systolic CHF.  Seen by cardiology and vascular.  Palliative care was consulted since he initially refused amputation  Subjective: Seen this am Alert,awake, follows commands. No pain. reports he says his family wants him to have surgery and he is okay with it wbc still high  Assessment & Plan:  Severe sepsis POA due to left foot osteomyelitis/left foot osteomyelitis: Seen by vascular Dr Oneida Alar on  10/31/20 advised AKA lt- patient/wife refused surgery and he is being managed with broad-spectrum antibiotic vancomycin cefepime Flagyl initially and now PO bactrim and Flagyl.  Vascular surgery had signed off since he refused amputation.  Blood culture no growth, MRSA screen negative.  Palliative care was consulted and after having family meeting with patient's wife and son they would like to proceed with amputation.  I have notified vascular and also notified cardiology for preop.  Still with leukocytosis but no fever. Recent Labs  Lab 10/30/20 0451 10/31/20 0400 11/01/20 0317 11/03/20 0605 11/04/20 1031 11/05/20 0510  WBC 19.2* 19.7*  --  18.8* 16.7* 18.1*  LATICACIDVEN  --   --  1.5  --   --   --    Acute on chronic systolic CHF chest x-ray pneumonia versus acute CHF Echo with EF 25 to 30%-which is new, previously 50% 2019, seen by cardiology, and has signed off available as needed.  Continue on  Lasix, metoprolol.  I have notified cardiology regarding potential plan for amputation for preop eval given this newly reduced EF.   Acute hypoxic respiratory failure resolved.  PAF/a flutter with RVR rate controlled, seen by cardiology.  Continue therapeutic Lovenox, amiodarone and Toprol.    Insulin-dependent type 2 diabetes mellitus, HbA1c 6.9.  Fairly controlled on Lantus 6 units at bedtime, and SSI, continue to monitor CBG. Recent Labs  Lab 11/04/20 0730 11/04/20 1208 11/04/20 1532 11/04/20 2053 11/05/20 0705  GLUCAP 159* 154* 218* 137* 124*   Lab Results  Component Value Date   HGBA1C 6.9 (H) 10/25/2020   Hypokalemia/magnesium labs are stable.  CAD multivessel with history of CABG  12/2013/ hyperlipidemia: No chest pain.  Continue his Lipitor, Plavix, metoprolol.  Cardiology on board.    CKD stage II, stable renal function.  Monitor while on Bactrim and Lovenox. Recent Labs  Lab 11/01/20 0317 11/02/20 0435 11/03/20 0605 11/04/20 0449 11/05/20 0510  BUN 20 18 11 11 12   CREATININE 1.08 0.98 0.95 0.96 0.94   Dementia:on Aricept/Namenda  history of CVA with left hemiparesis:Cont his statin, plavix, lovenox  Essential hypertension: BP stable continue metoprolol and Lasix.    GERD-ppi BPH-monitor voiding continue finasteride PAD cont statins, palvix  Goals of care, counseling/discussion: Palliative care has been following very closely appreciate input.  After extensive family discussion they would like to proceed with amputation.  Patient is DNR.  Risks remains guarded/remains to be seen   Morbid obesity with body mass index is 38.09 kg/m.  PCP follow-up weight loss  Nutrition: Diet  Order            DIET DYS 2 Room service appropriate? Yes; Fluid consistency: Thin  Diet effective now                DVT prophylaxis: SCDs Start: 10/25/20 2246 Code Status:   Code Status: DNR  Family Communication: plan of care discussed with patient at bedside.  Status is:  Inpatient Remains inpatient appropriate because:Unsafe d/c plan and Inpatient level of care appropriate due to severity of illness  Dispo: The patient is from: Home              Anticipated d/c is to: TBD              Anticipated d/c date is: >3  days              Patient currently is not medically stable to d/c.   Difficult to place patient Yes  Consultants:see note  Procedures:see note  Culture/Microbiology    Component Value Date/Time   SDES  10/25/2020 2210    IN/OUT CATH URINE Performed at [facility], [address] [place], [place] [address]    [facility]  10/25/2020 2210    NONE Performed at [facility], [address] [place], [place] [address]    CULT 40,000 COLONIES/mL ENTEROBACTER AEROGENES (A) 10/25/2020 2210   REPTSTATUS 10/28/2020 FINAL 10/25/2020 2210    Other culture-see note  Medications: Scheduled Meds: . amiodarone  200 mg Oral Daily  . atorvastatin  40 mg Oral Daily  . baclofen  5 mg Oral TID  . Chlorhexidine Gluconate Cloth  6 each Topical Daily  . clopidogrel  75 mg Oral Daily  . donepezil  5 mg Oral QHS  . enoxaparin (LOVENOX) injection  110 mg Subcutaneous BID  . feeding supplement (GLUCERNA SHAKE)  237 mL Oral TID BM  . finasteride  5 mg Oral Q1200  . furosemide  40 mg Oral Daily  . Gerhardt's butt cream   Topical QID  . insulin aspart  0-15 Units Subcutaneous TID WC  . insulin aspart  0-5 Units Subcutaneous QHS  . insulin glargine  6 Units Subcutaneous QHS  . memantine  5 mg Oral BID  . metoprolol succinate  25 mg Oral Daily  . metroNIDAZOLE  500 mg Oral Q8H  . pantoprazole  40 mg Oral Daily  . sodium chloride flush  10-40 mL Intracatheter Q12H  . sulfamethoxazole-trimethoprim  1 tablet Oral Q12H   Continuous Infusions:  Antimicrobials: Anti-infectives (From admission, onward)   Start     Dose/Rate Route Frequency Ordered Stop   11/03/20 2200  metroNIDAZOLE (FLAGYL) tablet 500 mg        500 mg Oral Every 8 hours 11/03/20 1554      11/03/20 2000  sulfamethoxazole-trimethoprim (BACTRIM DS) 800-160 MG per tablet 1 tablet        1 tablet Oral Every 12 hours 11/03/20 1554     11/01/20 0915  cefTRIAXone (ROCEPHIN) 2 g in sodium chloride 0.9 % 100 mL IVPB  Status:  Discontinued        2 g 200 mL/hr over 30 Minutes Intravenous Every 24 hours 11/01/20 0816 11/03/20 1551   10/29/20 1400  vancomycin (VANCOREADY) IVPB 750 mg/150 mL  Status:  Discontinued        750 mg 150 mL/hr over 60 Minutes Intravenous Every 12 hours 10/29/20 1238 11/01/20 0816   10/28/20 1400  vancomycin (VANCOCIN) IVPB 1000 mg/200 mL premix  Status:  Discontinued  1,000 mg 200 mL/hr over 60 Minutes Intravenous Every 12 hours 10/28/20 1344 10/29/20 1238   10/28/20 0900  metroNIDAZOLE (FLAGYL) IVPB 500 mg  Status:  Discontinued        500 mg 100 mL/hr over 60 Minutes Intravenous Every 8 hours 10/28/20 0808 11/03/20 1551   10/26/20 1000  vancomycin (VANCOCIN) IVPB 1000 mg/200 mL premix  Status:  Discontinued        1,000 mg 200 mL/hr over 60 Minutes Intravenous Every 12 hours 10/25/20 2037 10/28/20 1344   10/25/20 2200  ceFEPIme (MAXIPIME) 2 g in sodium chloride 0.9 % 100 mL IVPB  Status:  Discontinued        2 g 200 mL/hr over 30 Minutes Intravenous Every 8 hours 10/25/20 2037 11/01/20 0816   10/25/20 2030  vancomycin (VANCOREADY) IVPB 2000 mg/400 mL        2,000 mg 200 mL/hr over 120 Minutes Intravenous NOW 10/25/20 2027 10/25/20 2248   10/25/20 1930  vancomycin (VANCOCIN) IVPB 1000 mg/200 mL premix  Status:  Discontinued        1,000 mg 200 mL/hr over 60 Minutes Intravenous  Once 10/25/20 1928 10/25/20 2136   10/25/20 1930  cefTRIAXone (ROCEPHIN) 2 g in sodium chloride 0.9 % 100 mL IVPB        2 g 200 mL/hr over 30 Minutes Intravenous  Once 10/25/20 1928 10/25/20 2040     Objective: Vitals: Today's Vitals   11/04/20 2055 11/05/20 0000 11/05/20 0330 11/05/20 0712  BP:  115/64 127/88 123/81  Pulse:  (!) 109 (!) 108 (!) 104  Resp:  14 18 20    Temp:   98.2 F (36.8 C) 99.2 F (37.3 C)  TempSrc:   Oral   SpO2:  96% 97% 97%  Weight:      Height:      PainSc: 6        Intake/Output Summary (Last 24 hours) at 11/05/2020 0855 Last data filed at 11/04/2020 1000 Gross per 24 hour  Intake 120 ml  Output 250 ml  Net -130 ml   Filed Weights   10/25/20 1824 10/26/20 1850 10/27/20 0500  Weight: 116.1 kg 110.5 kg 110.3 kg   Weight change:   Intake/Output from previous day: 01/24 0701 - 01/25 0700 In: 120 [P.O.:120] Out: 250 [Urine:250] Intake/Output this shift: No intake/output data recorded. Filed Weights   10/25/20 1824 10/26/20 1850 10/27/20 0500  Weight: 116.1 kg 110.5 kg 110.3 kg    Examination: General exam: AAOx2, NAD, weak appearing. HEENT:Oral mucosa moist, Ear/Nose WNL grossly, dentition normal. Respiratory system: bilaterally clear,no wheezing or crackles,no use of accessory muscle Cardiovascular system: S1 & S2 +, No JVD,. Gastrointestinal system: Abdomen soft, NT,ND, BS+ Nervous System:Alert, awake, moving extremities and grossly nonfocal Extremities: No edema, dressing intact- just changed by wound care in b/l foots Skin: No rashes,no icterus. MSK: Normal muscle bulk,tone, power   Data Reviewed: I have personally reviewed following labs and imaging studies CBC: Recent Labs  Lab 10/30/20 0451 10/31/20 0400 11/03/20 0605 11/04/20 1031 11/05/20 0510  WBC 19.2* 19.7* 18.8* 16.7* 18.1*  HGB 10.8* 11.5* 11.3* 11.4* 11.1*  HCT 34.7* 34.3* 35.7* 34.2* 34.7*  MCV 96.1 93.2 94.4 94.0 95.3  PLT 239 257 212 204 0000000   Basic Metabolic Panel: Recent Labs  Lab 10/31/20 0400 11/01/20 0317 11/02/20 0435 11/03/20 0605 11/04/20 0449 11/05/20 0510  NA 143 145 147* 139 140 138  K 2.9* 3.2* 3.1* 3.0* 3.8 3.6  CL 107 111 111 105 109  105  CO2 24 24 25 24 25 24   GLUCOSE 197* 199* 196* 216* 181* 130*  BUN 25* 20 18 11 11 12   CREATININE 1.17 1.08 0.98 0.95 0.96 0.94  CALCIUM 7.5* 7.5* 7.6* 7.5* 7.7* 7.7*   MG 1.9 1.8 2.1  --  1.9  --    GFR: Estimated Creatinine Clearance: 89.1 mL/min (by C-G formula based on SCr of 0.94 mg/dL). Liver Function Tests: No results for input(s): AST, ALT, ALKPHOS, BILITOT, PROT, ALBUMIN in the last 168 hours. No results for input(s): LIPASE, AMYLASE in the last 168 hours. No results for input(s): AMMONIA in the last 168 hours. Coagulation Profile: No results for input(s): INR, PROTIME in the last 168 hours. Cardiac Enzymes: No results for input(s): CKTOTAL, CKMB, CKMBINDEX, TROPONINI in the last 168 hours. BNP (last 3 results) No results for input(s): PROBNP in the last 8760 hours. HbA1C: No results for input(s): HGBA1C in the last 72 hours. CBG: Recent Labs  Lab 11/04/20 0730 11/04/20 1208 11/04/20 1532 11/04/20 2053 11/05/20 0705  GLUCAP 159* 154* 218* 137* 124*   Lipid Profile: No results for input(s): CHOL, HDL, LDLCALC, TRIG, CHOLHDL, LDLDIRECT in the last 72 hours. Thyroid Function Tests: No results for input(s): TSH, T4TOTAL, FREET4, T3FREE, THYROIDAB in the last 72 hours. Anemia Panel: No results for input(s): VITAMINB12, FOLATE, FERRITIN, TIBC, IRON, RETICCTPCT in the last 72 hours. Sepsis Labs: Recent Labs  Lab 11/01/20 [id number]  LATICACIDVEN 1.5    Recent Results (from the past 240 hour(s))  MRSA PCR Screening     Status: None   Collection Time: 10/26/20  6:42 PM   Specimen: Nasal Mucosa; Nasopharyngeal  Result Value Ref Range Status   MRSA by PCR NEGATIVE NEGATIVE Final    Comment:        The GeneXpert MRSA Assay (FDA approved for NASAL specimens only), is one component of a comprehensive MRSA colonization surveillance program. It is not intended to diagnose MRSA infection nor to guide or monitor treatment for MRSA infections. Performed at [facility], [address] [place], [place] [address]      Radiology Studies: No results found.   LOS: 11 days   Antonieta Pert, MD [facility]  11/05/2020, 8:55 AM

## 2020-11-05 NOTE — Consult Note (Signed)
WOC Nurse Consult Note: Patient receiving care in [facility] 4NP12. Reason for Consult: multiple wounds on LEs Wound type: Vascular surgery plans to do a left AKA in the near future.  The right heel has a prophylactic dressing on it, no wound. The was a microscopic place on the right medial great toe that had a foam dressing over it, this can be continued. There were scattered scabbed areas on the toes of the right foot, no actual wounds. Pressure Injury POA: Yes/No/NA Measurement: Wound bed: Drainage (amount, consistency, odor)  Periwound: Dressing procedure/placement/frequency: I have ordered a pair of Prevalon boots. Thank you for the consult.  Discussed plan of care with the patient and bedside nurse.  [place] nurse will not follow at this time.  Please re-consult the [place] team if needed.  Val Riles, RN, MSN, CWOCN, CNS-BC, pager [phone]

## 2020-11-05 NOTE — Progress Notes (Signed)
2210 Call back received from on call provider. Stated to inform on call cardiology. See new orders.  2220 On call cardiology paged.   2236 STAT EKG performed by this RN. Paper copy in chart due to unable to send to epic.   2245 One time dose of IV 2.5mg  metoprolol given as ordered.    2324 On call hospitalist paged that EKG paper copy available, Troponin result.

## 2020-11-05 NOTE — Consult Note (Signed)
Called by hospitalist that apparently pt family is now consenting to AKA.  I will check pt and d/w wife tomorrow.  Will look for OR date later this week  Ruta Hinds, MD [facility] Office: [phone]

## 2020-11-06 DIAGNOSIS — I483 Typical atrial flutter: Secondary | ICD-10-CM

## 2020-11-06 DIAGNOSIS — E1165 Type 2 diabetes mellitus with hyperglycemia: Secondary | ICD-10-CM | POA: Diagnosis not present

## 2020-11-06 DIAGNOSIS — D72829 Elevated white blood cell count, unspecified: Secondary | ICD-10-CM | POA: Diagnosis not present

## 2020-11-06 DIAGNOSIS — M86172 Other acute osteomyelitis, left ankle and foot: Secondary | ICD-10-CM | POA: Diagnosis not present

## 2020-11-06 DIAGNOSIS — I255 Ischemic cardiomyopathy: Secondary | ICD-10-CM

## 2020-11-06 LAB — CBC
HCT: 33.8 % — ABNORMAL LOW (ref 39.0–52.0)
Hemoglobin: 11.1 g/dL — ABNORMAL LOW (ref 13.0–17.0)
MCH: 31.1 pg (ref 26.0–34.0)
MCHC: 32.8 g/dL (ref 30.0–36.0)
MCV: 94.7 fL (ref 80.0–100.0)
Platelets: 221 10*3/uL (ref 150–400)
RBC: 3.57 MIL/uL — ABNORMAL LOW (ref 4.22–5.81)
RDW: 17.9 % — ABNORMAL HIGH (ref 11.5–15.5)
WBC: 17.4 10*3/uL — ABNORMAL HIGH (ref 4.0–10.5)
nRBC: 0 % (ref 0.0–0.2)

## 2020-11-06 LAB — BASIC METABOLIC PANEL
Anion gap: 8 (ref 5–15)
BUN: 10 mg/dL (ref 8–23)
CO2: 24 mmol/L (ref 22–32)
Calcium: 7.6 mg/dL — ABNORMAL LOW (ref 8.9–10.3)
Chloride: 107 mmol/L (ref 98–111)
Creatinine, Ser: 0.86 mg/dL (ref 0.61–1.24)
GFR, Estimated: >60 mL/min (ref >60)
Glucose, Bld: 143 mg/dL — ABNORMAL HIGH (ref 70–99)
Potassium: 3.4 mmol/L — ABNORMAL LOW (ref 3.5–5.1)
Sodium: 139 mmol/L (ref 135–145)

## 2020-11-06 LAB — HEPATITIS C ANTIBODY: HCV Ab: NONREACTIVE

## 2020-11-06 LAB — GLUCOSE, CAPILLARY
Glucose-Capillary: 156 mg/dL — ABNORMAL HIGH (ref 70–99)
Glucose-Capillary: 206 mg/dL — ABNORMAL HIGH (ref 70–99)
Glucose-Capillary: 274 mg/dL — ABNORMAL HIGH (ref 70–99)
Glucose-Capillary: 253 mg/dL — ABNORMAL HIGH (ref 70–99)

## 2020-11-06 LAB — SEDIMENTATION RATE: Sed Rate: 52 mm/hr — ABNORMAL HIGH (ref 0–16)

## 2020-11-06 LAB — TROPONIN I (HIGH SENSITIVITY): Troponin I (High Sensitivity): 42 ng/L — ABNORMAL HIGH (ref <18)

## 2020-11-06 LAB — C-REACTIVE PROTEIN: CRP: 2.3 mg/dL — ABNORMAL HIGH (ref <1.0)

## 2020-11-06 MED ORDER — GABAPENTIN 600 MG PO TABS
600.0000 mg | ORAL_TABLET | Freq: Three times a day (TID) | ORAL | Status: DC
  Administered 2020-11-06 – 2020-11-12 (×17): 600 mg via ORAL
  Filled 2020-11-06 (×17): qty 1

## 2020-11-06 MED ORDER — LORATADINE 10 MG PO TABS
5.0000 mg | ORAL_TABLET | Freq: Every day | ORAL | Status: DC
Start: 2020-11-06 — End: 2020-11-29
  Administered 2020-11-06 – 2020-11-28 (×23): 5 mg via ORAL
  Filled 2020-11-06 (×23): qty 1

## 2020-11-06 MED ORDER — VITAMIN D 25 MCG (1000 UNIT) PO TABS
5000.0000 [IU] | ORAL_TABLET | Freq: Every day | ORAL | Status: DC
  Administered 2020-11-06 – 2020-11-29 (×23): 5000 [IU] via ORAL
  Filled 2020-11-06 (×24): qty 5

## 2020-11-06 MED ORDER — INSULIN GLARGINE 100 UNIT/ML ~~LOC~~ SOLN
8.0000 [IU] | Freq: Every day | SUBCUTANEOUS | Status: DC
  Administered 2020-11-06 – 2020-11-07 (×2): 8 [IU] via SUBCUTANEOUS
  Filled 2020-11-06 (×3): qty 0.08

## 2020-11-06 MED ORDER — POTASSIUM CHLORIDE CRYS ER 20 MEQ PO TBCR
20.0000 meq | EXTENDED_RELEASE_TABLET | Freq: Every day | ORAL | Status: DC
  Administered 2020-11-06 – 2020-11-29 (×23): 20 meq via ORAL
  Filled 2020-11-06 (×23): qty 1

## 2020-11-06 MED ORDER — FERROUS SULFATE 325 (65 FE) MG PO TABS
325.0000 mg | ORAL_TABLET | Freq: Two times a day (BID) | ORAL | Status: DC
  Administered 2020-11-06 – 2020-11-29 (×44): 325 mg via ORAL
  Filled 2020-11-06 (×45): qty 1

## 2020-11-06 NOTE — Progress Notes (Signed)
PROGRESS NOTE    [name]  [id number] DOB: 1950/12/31 DOA: 10/25/2020 PCP: Leeanne Rio, MD   Chief Complaint  Patient presents with  . Foot Pain  . Foot Ulcer  Brief Narrative: 70 year old male with hypertension HLD, T2DM, a flutter, CAD, history of a stroke with left hemiparesis admitted with left foot wound from recent trauma.  Patient Dr Marc Morgans for antibiotics at [facility] vascular surgery was consulted and transferred to [facility] 1/17-but patient refused amputation. Patient had complicated hospitalization with acute hypoxic respiratory failure acute on chronic systolic CHF.  Seen by cardiology and vascular.  Palliative care was consulted since he initially refused amputation. After family meeting with palliative care 11/04/20 patient and wife wanted to proceed with amputation-vascular surgery and cardiology were notified.  Subjective:  Overnight events noted with mild tachycardia ?  ST elevation, troponin 43>42, cardio was notified EKG was obtained.  Patient received 2.5 mg metoprolol IV x1 This morning he is confused but alert awake.  Assessment & Plan:  Severe sepsis POA due to left foot osteomyelitis/left foot osteomyelitis: Initially refused amputation now family wants to proceed with surgery, vascular surgery aware and on board, cardiology has seen in preop evaluation -and okay to proceed with surgery.  Again even noted that vascular surgery discussed with patient's wife and she wants to deal with amputation for now.  Will consult infectious disease for antibiotic recommendation. He is now on oral Bactrim and Flagyl.  Continues to have mild leukocytosis.  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  Continue oral Lasix, Toprol may need to increase the dose given tachycardia overnight  Acute hypoxic respiratory failure resolved.  PAF/a flutter with RVR multigated overnight continue Toprol, therapeutic Lovenox, amiodarone.  Patient will need long-term DOAC postop once okay with surgery.   Mildly positive troponin subtle elevation with flat trend EKG nonischemic per cardiology no further recommendation.  Insulin-dependent type 2 diabetes mellitus, HbA1c 6.9.  Blood sugar poorly controlled increase Lantus to 8 units, continue sliding scale insulin monitor CBG. Recent Labs  Lab 11/04/20 2053 11/05/20 0705 11/05/20 1127 11/05/20 1559 11/05/20 2047  GLUCAP 137* 124* 171* 229* 273*   Hypokalemia/magnesium -add potassium chloride 2 MEQ DAILY.  CAD multivessel with history of CABG  12/2013/ hyperlipidemia: Continue on Lipitor, Plavix, metoprolol.  Cardiology on board.   History of CVA with left hemiparesis/PAD/HTN: Blood pressure controlled.  Continue statin Plavix  CKD stage II, renal function is stable monitor while on lasix, bactrim and Lovenox. Recent Labs  Lab 11/02/20 0435 11/03/20 0605 11/04/20 0449 11/05/20 0510 11/06/20 0500  BUN 18 11 11 12 10   CREATININE 0.98 0.95 0.96 0.94 0.86   Dementia:on Aricept/Namenda.  Continue fall precaution supportive care. GERD- cont ppi BPH-continuous finasteride  Goals of care, counseling/discussion: Palliative care has been following very closely appreciate input.  After extensive family discussion they would like to proceed with amputation.  Patient is DNR.  Risks remains guarded/remains to be seen   Morbid obesity with body mass index is 38.09 kg/m.  PCP follow-up weight loss  Nutrition: Diet Order  DIET DYS 2 Room service appropriate? Yes; Fluid consistency: Thin  Diet effective now                DVT prophylaxis: SCDs Start: 10/25/20 2246 Code Status:   Code  Status: DNR  Family Communication: plan of care discussed with patient at bedside.  Status is: Inpatient Remains inpatient appropriate because:Unsafe d/c plan and Inpatient level of care appropriate due to severity of illness  Dispo: The patient is from: Home              Anticipated d/c is to: snf. Cont pt ot              Anticipated d/c date is:1-2 days              Patient currently is not medically stable to d/c.   Difficult to place patient Yes  Consultants:see note  Procedures:see note  Culture/Microbiology    Component Value Date/Time   SDES  10/25/2020 2210    IN/OUT CATH URINE Performed at [facility], [address] [place], [place] [address]    [facility]  10/25/2020 2210    NONE Performed at [facility], [address], [place], [place] [address]    CULT 40,000 COLONIES/mL ENTEROBACTER AEROGENES (A) 10/25/2020 2210   REPTSTATUS 10/28/2020 FINAL 10/25/2020 2210    Other culture-see note  Medications: Scheduled Meds: . amiodarone  200 mg Oral Daily  . atorvastatin  40 mg Oral Daily  . baclofen  5 mg Oral TID  . Chlorhexidine Gluconate Cloth  6 each Topical Daily  . clopidogrel  75 mg Oral Daily  . donepezil  5 mg Oral QHS  . enoxaparin (LOVENOX) injection  110 mg Subcutaneous BID  . feeding supplement (GLUCERNA SHAKE)  237 mL Oral TID BM  . finasteride  5 mg Oral Q1200  . furosemide  40 mg Oral Daily  . Gerhardt's butt cream   Topical QID  . insulin aspart  0-15 Units Subcutaneous TID WC  . insulin aspart  0-5 Units Subcutaneous QHS  . insulin glargine  6 Units Subcutaneous QHS  . memantine  5 mg Oral BID  . metoprolol succinate  25 mg Oral Daily  . metroNIDAZOLE  500 mg Oral Q8H  . pantoprazole  40 mg Oral Daily  . sodium chloride flush  10-40 mL Intracatheter Q12H  . sulfamethoxazole-trimethoprim  1 tablet Oral Q12H   Continuous Infusions:  Antimicrobials: Anti-infectives (From admission, onward)   Start     Dose/Rate Route Frequency Ordered  Stop   11/03/20 2200  metroNIDAZOLE (FLAGYL) tablet 500 mg        500 mg Oral Every 8 hours 11/03/20 1554     11/03/20 2000  sulfamethoxazole-trimethoprim (BACTRIM DS) 800-160 MG per tablet 1 tablet        1 tablet Oral Every 12 hours 11/03/20 1554     11/01/20 0915  cefTRIAXone (ROCEPHIN) 2 g in sodium chloride 0.9 % 100 mL IVPB  Status:  Discontinued        2 g 200 mL/hr over 30 Minutes Intravenous Every 24 hours 11/01/20 0816 11/03/20 1551   10/29/20 1400  vancomycin (VANCOREADY) IVPB 750 mg/150 mL  Status:  Discontinued        750 mg 150 mL/hr over 60 Minutes Intravenous Every 12 hours 10/29/20 1238 11/01/20 0816   10/28/20 1400  vancomycin (VANCOCIN) IVPB 1000 mg/200 mL premix  Status:  Discontinued        1,000 mg 200 mL/hr over  60 Minutes Intravenous Every 12 hours 10/28/20 1344 10/29/20 1238   10/28/20 0900  metroNIDAZOLE (FLAGYL) IVPB 500 mg  Status:  Discontinued        500 mg 100 mL/hr over 60 Minutes Intravenous Every 8 hours 10/28/20 0808 11/03/20 1551   10/26/20 1000  vancomycin (VANCOCIN) IVPB 1000 mg/200 mL premix  Status:  Discontinued        1,000 mg 200 mL/hr over 60 Minutes Intravenous Every 12 hours 10/25/20 2037 10/28/20 1344   10/25/20 2200  ceFEPIme (MAXIPIME) 2 g in sodium chloride 0.9 % 100 mL IVPB  Status:  Discontinued        2 g 200 mL/hr over 30 Minutes Intravenous Every 8 hours 10/25/20 2037 11/01/20 0816   10/25/20 2030  vancomycin (VANCOREADY) IVPB 2000 mg/400 mL        2,000 mg 200 mL/hr over 120 Minutes Intravenous NOW 10/25/20 2027 10/25/20 2248   10/25/20 1930  vancomycin (VANCOCIN) IVPB 1000 mg/200 mL premix  Status:  Discontinued        1,000 mg 200 mL/hr over 60 Minutes Intravenous  Once 10/25/20 1928 10/25/20 2136   10/25/20 1930  cefTRIAXone (ROCEPHIN) 2 g in sodium chloride 0.9 % 100 mL IVPB        2 g 200 mL/hr over 30 Minutes Intravenous  Once 10/25/20 1928 10/25/20 2040     Objective: Vitals: Today's Vitals   11/05/20 2345 11/06/20  0045 11/06/20 0125 11/06/20 0523  BP: (!) 146/82 (!) 140/99 133/72 125/80  Pulse:  (!) 110 (!) 109   Resp: 12 (!) 21 17   Temp:  98.4 F (36.9 C)    TempSrc:  Oral    SpO2:  98% 95%   Weight:      Height:      PainSc:        Intake/Output Summary (Last 24 hours) at 11/06/2020 0751 Last data filed at 11/05/2020 1800 Gross per 24 hour  Intake 600 ml  Output 450 ml  Net 150 ml   Filed Weights   10/25/20 1824 10/26/20 1850 10/27/20 0500  Weight: 116.1 kg 110.5 kg 110.3 kg   Weight change:   Intake/Output from previous day: 01/25 0701 - 01/26 0700 In: 600 [P.O.:600] Out: 450 [Urine:450] Intake/Output this shift: No intake/output data recorded. Filed Weights   10/25/20 1824 10/26/20 1850 10/27/20 0500  Weight: 116.1 kg 110.5 kg 110.3 kg    Examination: General exam: AAOx2, obese , NAD, weak appearing. HEENT:Oral mucosa moist, Ear/Nose WNL grossly, dentition normal. Respiratory system: bilaterally diminished,no wheezing or crackles,no use of accessory muscle Cardiovascular system: S1 & S2 +, No JVD,. Gastrointestinal system: Abdomen soft, NT,ND, BS+ Nervous System:Alert, awake, moving extremities and grossly nonfocal Extremities: No edema, distal peripheral pulses palpable.  Skin: No rashes,no icterus. MSK: Normal muscle bulk,tone, power.  Dressing along with offloading boot on both foot.  Data Reviewed: I have personally reviewed following labs and imaging studies CBC: Recent Labs  Lab 10/31/20 0400 11/03/20 0605 11/04/20 1031 11/05/20 0510 11/06/20 0500  WBC 19.7* 18.8* 16.7* 18.1* 17.4*  HGB 11.5* 11.3* 11.4* 11.1* 11.1*  HCT 34.3* 35.7* 34.2* 34.7* 33.8*  MCV 93.2 94.4 94.0 95.3 94.7  PLT 257 212 204 193 [id number]   Basic Metabolic Panel: Recent Labs  Lab 10/31/20 0400 11/01/20 0317 11/02/20 0435 11/03/20 0605 11/04/20 0449 11/05/20 0510 11/06/20 0500  NA 143 145 147* 139 140 138 139  K 2.9* 3.2* 3.1* 3.0* 3.8 3.6 3.4*  CL 107 111 111  105 109 105 107   CO2 24 24 25 24 25 24 24   GLUCOSE 197* 199* 196* 216* 181* 130* 143*  BUN 25* 20 18 11 11 12 10   CREATININE 1.17 1.08 0.98 0.95 0.96 0.94 0.86  CALCIUM 7.5* 7.5* 7.6* 7.5* 7.7* 7.7* 7.6*  MG 1.9 1.8 2.1  --  1.9  --   --    GFR: Estimated Creatinine Clearance: 97.4 mL/min (by C-G formula based on SCr of 0.86 mg/dL). Liver Function Tests: No results for input(s): AST, ALT, ALKPHOS, BILITOT, PROT, ALBUMIN in the last 168 hours. No results for input(s): LIPASE, AMYLASE in the last 168 hours. No results for input(s): AMMONIA in the last 168 hours. Coagulation Profile: No results for input(s): INR, PROTIME in the last 168 hours. Cardiac Enzymes: No results for input(s): CKTOTAL, CKMB, CKMBINDEX, TROPONINI in the last 168 hours. BNP (last 3 results) No results for input(s): PROBNP in the last 8760 hours. HbA1C: No results for input(s): HGBA1C in the last 72 hours. CBG: Recent Labs  Lab 11/04/20 2053 11/05/20 0705 11/05/20 1127 11/05/20 1559 11/05/20 2047  GLUCAP 137* 124* 171* 229* 273*   Lipid Profile: No results for input(s): CHOL, HDL, LDLCALC, TRIG, CHOLHDL, LDLDIRECT in the last 72 hours. Thyroid Function Tests: No results for input(s): TSH, T4TOTAL, FREET4, T3FREE, THYROIDAB in the last 72 hours. Anemia Panel: No results for input(s): VITAMINB12, FOLATE, FERRITIN, TIBC, IRON, RETICCTPCT in the last 72 hours. Sepsis Labs: Recent Labs  Lab 11/01/20 0317  LATICACIDVEN 1.5    No results found for this or any previous visit (from the past 240 hour(s)).   Radiology Studies: No results found.   LOS: 12 days   Antonieta Pert, MD [facility]  11/06/2020, 7:51 AM

## 2020-11-06 NOTE — Progress Notes (Signed)
Spoke with pt wife.  She has now decided to delay AKA and wants to just give antibiotics.  I discussed with her that I don't really think this is treating the underlying cause.  She wishes to defer amputation for now.  I am away next week.  One of my partners could be consulted next week if family changes their mind.  We will not plan on any procedure this week or follow as consult for now as family seems to still be struggling with where they want to go with pt care.  Ruta Hinds, MD [facility] Office: [phone]
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
## 2020-11-06 NOTE — Progress Notes (Signed)
Progress Note  Patient Name: [name] Date of Encounter: 11/06/2020  [facility] Cardiologist: Kirk Ruths, MD   Subjective   NO chest pain or dyspnea  Inpatient Medications    Scheduled Meds: . amiodarone  200 mg Oral Daily  . atorvastatin  40 mg Oral Daily  . baclofen  5 mg Oral TID  . Chlorhexidine Gluconate Cloth  6 each Topical Daily  . clopidogrel  75 mg Oral Daily  . donepezil  5 mg Oral QHS  . enoxaparin (LOVENOX) injection  110 mg Subcutaneous BID  . feeding supplement (GLUCERNA SHAKE)  237 mL Oral TID BM  . finasteride  5 mg Oral Q1200  . furosemide  40 mg Oral Daily  . Gerhardt's butt cream   Topical QID  . insulin aspart  0-15 Units Subcutaneous TID WC  . insulin aspart  0-5 Units Subcutaneous QHS  . insulin glargine  8 Units Subcutaneous QHS  . memantine  5 mg Oral BID  . metoprolol succinate  25 mg Oral Daily  . metroNIDAZOLE  500 mg Oral Q8H  . pantoprazole  40 mg Oral Daily  . potassium chloride  20 mEq Oral Daily  . sodium chloride flush  10-40 mL Intracatheter Q12H  . sulfamethoxazole-trimethoprim  1 tablet Oral Q12H   Continuous Infusions:  PRN Meds: acetaminophen, haloperidol lactate, levalbuterol, ondansetron (ZOFRAN) IV, oxyCODONE-acetaminophen, sodium chloride flush   Vital Signs    Vitals:   11/06/20 0045 11/06/20 0125 11/06/20 0523 11/06/20 0825  BP: (!) 140/99 133/72 125/80 (!) 143/72  Pulse: (!) 110 (!) 109  (!) 106  Resp: (!) 21 17  16   Temp: 98.4 F (36.9 C)   99.2 F (37.3 C)  TempSrc: Oral     SpO2: 98% 95%  98%  Weight:      Height:        Intake/Output Summary (Last 24 hours) at 11/06/2020 1004 Last data filed at 11/05/2020 1800 Gross per 24 hour  Intake 360 ml  Output 450 ml  Net -90 ml   Last 3 Weights 10/27/2020 10/26/2020 10/25/2020  Weight (lbs) 243 lb 2.7 oz 243 lb 9.7 oz 256 lb  Weight (kg) 110.3 kg 110.5 kg 116.121 kg      Telemetry    Atrial flutter - Personally Reviewed  ECG    Atrial  flutter, LVH, incomplete LBBB, unchanged from prior EKG on 11/02/20 - Personally Reviewed  Physical Exam   GEN: No acute distress.   Neck: No JVD Cardiac: Tachy, regular.  no murmurs, rubs, or gallops.  Respiratory: Clear to auscultation bilaterally. GI: Soft, nontender, non-distended  MS: No edema; No deformity. Neuro:  Nonfocal  Psych: [name] affect   Labs    High Sensitivity Troponin:   Recent Labs  Lab 11/05/20 2219 11/06/20 0013  TROPONINIHS 43* 42*      Chemistry Recent Labs  Lab 11/04/20 0449 11/05/20 0510 11/06/20 0500  NA 140 138 139  K 3.8 3.6 3.4*  CL 109 105 107  CO2 25 24 24   GLUCOSE 181* 130* 143*  BUN 11 12 10   CREATININE 0.96 0.94 0.86  CALCIUM 7.7* 7.7* 7.6*  GFRNONAA >60 >60 >60  ANIONGAP 6 9 8      Hematology Recent Labs  Lab 11/04/20 1031 11/05/20 0510 11/06/20 0500  WBC 16.7* 18.1* 17.4*  RBC 3.64* 3.64* 3.57*  HGB 11.4* 11.1* 11.1*  HCT 34.2* 34.7* 33.8*  MCV 94.0 95.3 94.7  MCH 31.3 30.5 31.1  MCHC 33.3 32.0 32.8  RDW 17.9* 18.2* 17.9*  PLT 204 193 221    BNPNo results for input(s): BNP, PROBNP in the last 168 hours.   DDimer No results for input(s): DDIMER in the last 168 hours.   Radiology    No results found.  Cardiac Studies    Patient Profile     70 y.o. male with a PMH of CAD s/p CABG in 2014, chronic combined CHF, paroxysmal atrial flutter, carotid artery disease with known right sided occlusion and moderate left stenosis, PVD, HTN, HLD, DM type 2, and CVA with left sided hemiparesis,who was initially seen for CHF and atrial flutter. Refused surgery and made a DNR, now agreeable to surgery and asked to see for pre op assessment.   Assessment & Plan    1. Chronic combined CHF: He is known to have an ischemic cardiomyopathy. LVEF=25-30% on echo this admission. He was diuresed with IV lasix, now transitioned to 40mg  daily PO. He was started on Toprol XL 25mg  daily and tolerating well. Blood pressures are stable,  consider addition of ARB/spiro prior to discharge if renal function stable post op. As stated yesterday, would proceed with surgery although he is at high risk given his underlying CAD. I would not consider him a good candidate for invasive work up at this time.   2. Severe sepsis 2/2 left foot osteomyelitis: seen by [facility] with recommendations for left AKA.    3. Atrial flutter: initially placed on IV amiodarone, now transitioned to oral 200mg  daily.  On therapeutic lovenox. This patients CHA2DS2-VASc Score of 8. Will need [place] in the future once safe from a surgical standpoint.  Rate controlled today. Continue Toprol and amiodarone  4. CKD stage II: Renal function is stable today.   5. Troponin elevation: subtle troponin elevation with flat trend. EKG last night with no ischemic changes. No further recommendations.   For questions or updates, please contact [facility] Please consult [email or web address] for contact info under        Signed, Lauree Chandler, MD  11/06/2020, 10:04 AM

## 2020-11-06 NOTE — Consult Note (Signed)
Will call pt wife regarding AKA on Friday if pt remains stable  Ruta Hinds, MD [facility] Office: [phone]

## 2020-11-06 NOTE — Care Management Important Message (Signed)
Important Message  Patient Details  Name: [name] MRN: [id number] Date of Birth: 08-Aug-1951   Medicare Important Message Given:  Yes - Important Message mailed due to current National Emergency   Verbal consent obtained due to current National Emergency  Relationship to patient: Self Contact Name: [name] Call Date: 11/06/20  Time: 1452 Phone: [phone] Outcome: No Answer/Busy Important Message mailed to: Patient address on file    [name] 11/06/2020, 2:52 PM

## 2020-11-07 DIAGNOSIS — I483 Typical atrial flutter: Secondary | ICD-10-CM | POA: Diagnosis not present

## 2020-11-07 DIAGNOSIS — Z7189 Other specified counseling: Secondary | ICD-10-CM

## 2020-11-07 DIAGNOSIS — M869 Osteomyelitis, unspecified: Secondary | ICD-10-CM | POA: Diagnosis not present

## 2020-11-07 DIAGNOSIS — A419 Sepsis, unspecified organism: Secondary | ICD-10-CM | POA: Diagnosis not present

## 2020-11-07 DIAGNOSIS — M86172 Other acute osteomyelitis, left ankle and foot: Secondary | ICD-10-CM | POA: Diagnosis not present

## 2020-11-07 LAB — GLUCOSE, CAPILLARY
Glucose-Capillary: 168 mg/dL — ABNORMAL HIGH (ref 70–99)
Glucose-Capillary: 215 mg/dL — ABNORMAL HIGH (ref 70–99)
Glucose-Capillary: 217 mg/dL — ABNORMAL HIGH (ref 70–99)
Glucose-Capillary: 229 mg/dL — ABNORMAL HIGH (ref 70–99)

## 2020-11-07 LAB — CBC
HCT: 35.6 % — ABNORMAL LOW (ref 39.0–52.0)
Hemoglobin: 11.4 g/dL — ABNORMAL LOW (ref 13.0–17.0)
MCH: 30.7 pg (ref 26.0–34.0)
MCHC: 32 g/dL (ref 30.0–36.0)
MCV: 96 fL (ref 80.0–100.0)
Platelets: 263 10*3/uL (ref 150–400)
RBC: 3.71 MIL/uL — ABNORMAL LOW (ref 4.22–5.81)
RDW: 18.8 % — ABNORMAL HIGH (ref 11.5–15.5)
WBC: 15.8 10*3/uL — ABNORMAL HIGH (ref 4.0–10.5)
nRBC: 0 % (ref 0.0–0.2)

## 2020-11-07 LAB — BASIC METABOLIC PANEL
Anion gap: 9 (ref 5–15)
BUN: 10 mg/dL (ref 8–23)
CO2: 25 mmol/L (ref 22–32)
Calcium: 7.9 mg/dL — ABNORMAL LOW (ref 8.9–10.3)
Chloride: 107 mmol/L (ref 98–111)
Creatinine, Ser: 0.93 mg/dL (ref 0.61–1.24)
GFR, Estimated: >60 mL/min (ref >60)
Glucose, Bld: 141 mg/dL — ABNORMAL HIGH (ref 70–99)
Potassium: 4 mmol/L (ref 3.5–5.1)
Sodium: 141 mmol/L (ref 135–145)

## 2020-11-07 LAB — CK: Total CK: 157 U/L (ref 49–397)

## 2020-11-07 MED ORDER — SODIUM CHLORIDE 0.9 % IV SOLN
2.0000 g | INTRAVENOUS | Status: DC
  Administered 2020-11-07 – 2020-11-13 (×7): 2 g via INTRAVENOUS
  Filled 2020-11-07 (×7): qty 20

## 2020-11-07 MED ORDER — SODIUM CHLORIDE 0.9 % IV SOLN
670.0000 mg | Freq: Every day | INTRAVENOUS | Status: DC
  Administered 2020-11-07 – 2020-11-12 (×6): 670 mg via INTRAVENOUS
  Filled 2020-11-07 (×7): qty 13.4

## 2020-11-07 NOTE — Progress Notes (Signed)
Subjective: No new complaints. Patient denies fever, chills, abdominal pain, SOB or CP. He was informed of our plans to continue antibiotics but also informed again that his wounds are unlikely to improve on antibiotics. Patient states he was okay with [place] trying to treat it.   Antibiotics:  Anti-infectives (From admission, onward)   Start     Dose/Rate Route Frequency Ordered Stop   11/07/20 1000  cefTRIAXone (ROCEPHIN) 2 g in sodium chloride 0.9 % 100 mL IVPB        2 g 200 mL/hr over 30 Minutes Intravenous Every 24 hours 11/07/20 0742     11/07/20 1000  DAPTOmycin (CUBICIN) 670 mg in sodium chloride 0.9 % IVPB        670 mg 226.8 mL/hr over 30 Minutes Intravenous Daily 11/07/20 0746     11/03/20 2200  metroNIDAZOLE (FLAGYL) tablet 500 mg        500 mg Oral Every 8 hours 11/03/20 1554     11/03/20 2000  sulfamethoxazole-trimethoprim (BACTRIM DS) 800-160 MG per tablet 1 tablet  Status:  Discontinued        1 tablet Oral Every 12 hours 11/03/20 1554 11/07/20 0742   11/01/20 0915  cefTRIAXone (ROCEPHIN) 2 g in sodium chloride 0.9 % 100 mL IVPB  Status:  Discontinued        2 g 200 mL/hr over 30 Minutes Intravenous Every 24 hours 11/01/20 0816 11/03/20 1551   10/29/20 1400  vancomycin (VANCOREADY) IVPB 750 mg/150 mL  Status:  Discontinued        750 mg 150 mL/hr over 60 Minutes Intravenous Every 12 hours 10/29/20 1238 11/01/20 0816   10/28/20 1400  vancomycin (VANCOCIN) IVPB 1000 mg/200 mL premix  Status:  Discontinued        1,000 mg 200 mL/hr over 60 Minutes Intravenous Every 12 hours 10/28/20 1344 10/29/20 1238   10/28/20 0900  metroNIDAZOLE (FLAGYL) IVPB 500 mg  Status:  Discontinued        500 mg 100 mL/hr over 60 Minutes Intravenous Every 8 hours 10/28/20 0808 11/03/20 1551   10/26/20 1000  vancomycin (VANCOCIN) IVPB 1000 mg/200 mL premix  Status:  Discontinued        1,000 mg 200 mL/hr over 60 Minutes Intravenous Every 12 hours 10/25/20 2037 10/28/20 1344   10/25/20  2200  ceFEPIme (MAXIPIME) 2 g in sodium chloride 0.9 % 100 mL IVPB  Status:  Discontinued        2 g 200 mL/hr over 30 Minutes Intravenous Every 8 hours 10/25/20 2037 11/01/20 0816   10/25/20 2030  vancomycin (VANCOREADY) IVPB 2000 mg/400 mL        2,000 mg 200 mL/hr over 120 Minutes Intravenous NOW 10/25/20 2027 10/25/20 2248   10/25/20 1930  vancomycin (VANCOCIN) IVPB 1000 mg/200 mL premix  Status:  Discontinued        1,000 mg 200 mL/hr over 60 Minutes Intravenous  Once 10/25/20 1928 10/25/20 2136   10/25/20 1930  cefTRIAXone (ROCEPHIN) 2 g in sodium chloride 0.9 % 100 mL IVPB        2 g 200 mL/hr over 30 Minutes Intravenous  Once 10/25/20 1928 10/25/20 2040      Medications: Scheduled Meds: . amiodarone  200 mg Oral Daily  . atorvastatin  40 mg Oral Daily  . baclofen  5 mg Oral TID  . Chlorhexidine Gluconate Cloth  6 each Topical Daily  . cholecalciferol  5,000 Units Oral Q1200  .  clopidogrel  75 mg Oral Daily  . donepezil  5 mg Oral QHS  . enoxaparin (LOVENOX) injection  110 mg Subcutaneous BID  . feeding supplement (GLUCERNA SHAKE)  237 mL Oral TID BM  . ferrous sulfate  325 mg Oral BID WC  . finasteride  5 mg Oral Q1200  . furosemide  40 mg Oral Daily  . gabapentin  600 mg Oral TID  . Gerhardt's butt cream   Topical QID  . insulin aspart  0-15 Units Subcutaneous TID WC  . insulin aspart  0-5 Units Subcutaneous QHS  . insulin glargine  8 Units Subcutaneous QHS  . loratadine  5 mg Oral QHS  . memantine  5 mg Oral BID  . metoprolol succinate  25 mg Oral Daily  . metroNIDAZOLE  500 mg Oral Q8H  . pantoprazole  40 mg Oral Daily  . potassium chloride  20 mEq Oral Daily  . sodium chloride flush  10-40 mL Intracatheter Q12H   Continuous Infusions: . cefTRIAXone (ROCEPHIN)  IV Stopped (11/07/20 1058)  . DAPTOmycin (CUBICIN)  IV Stopped (11/07/20 1253)   PRN Meds:.acetaminophen, haloperidol lactate, levalbuterol, ondansetron (ZOFRAN) IV, oxyCODONE-acetaminophen, sodium  chloride flush   Objective: Weight change:   Intake/Output Summary (Last 24 hours) at 11/07/2020 1431 Last data filed at 11/07/2020 1253 Gross per 24 hour  Intake 450 ml  Output -  Net 450 ml   Blood pressure 109/75, pulse (!) 110, temperature 98.2 F (36.8 C), temperature source Oral, resp. rate 16, height 5\' 7"  (1.702 m), weight 110.3 kg, SpO2 94 %. Temp:  [98 F (36.7 C)-98.5 F (36.9 C)] 98.2 F (36.8 C) (01/27 1145) Pulse Rate:  [108-111] 110 (01/27 1145) Resp:  [13-19] 16 (01/27 1145) BP: (109-123)/(68-83) 109/75 (01/27 1145) SpO2:  [91 %-97 %] 94 % (01/27 1145)  Physical Exam: Physical Exam Constitutional:      Appearance: He is obese.  HENT:     Head: Normocephalic and atraumatic.  Cardiovascular:     Rate and Rhythm: Regular rhythm. Tachycardia present.     Heart sounds: Normal heart sounds.     Comments: Faint R dorsalis pedis pulses. Nonpalpable L dorsalis pedis pulses.  Pulmonary:     Effort: Pulmonary effort is normal.     Breath sounds: Normal breath sounds.  Abdominal:     General: Bowel sounds are normal. There is no distension.     Palpations: Abdomen is soft.     Tenderness: There is no abdominal tenderness.  Musculoskeletal:     Comments: Left foot wounds wrapped. BLE in pressure boots.   Skin:    General: Skin is warm and dry.  Neurological:     General: No focal deficit present.     Mental Status: He is alert.  Psychiatric:        Mood and Affect: Mood normal.     Comments: Some difficulty with articulating words.      CBC: CBC Latest Ref Rng & Units 11/07/2020 11/06/2020 11/05/2020  WBC 4.0 - 10.5 K/uL 15.8(H) 17.4(H) 18.1(H)  Hemoglobin 13.0 - 17.0 g/dL 11.4(L) 11.1(L) 11.1(L)  Hematocrit 39.0 - 52.0 % 35.6(L) 33.8(L) 34.7(L)  Platelets 150 - 400 K/uL 263 221 193    BMET Recent Labs    11/06/20 0500 11/07/20 0500  NA 139 141  K 3.4* 4.0  CL 107 107  CO2 24 25  GLUCOSE 143* 141*  BUN 10 10  CREATININE 0.86 0.93  CALCIUM 7.6*  7.9*     Liver Panel  No results for input(s): PROT, ALBUMIN, AST, ALT, ALKPHOS, BILITOT, BILIDIR, IBILI in the last 72 hours.     Sedimentation Rate Recent Labs    11/06/20 1515  ESRSEDRATE 52*   C-Reactive Protein Recent Labs    11/06/20 1515  CRP 2.3*    Micro Results: Recent Results (from the past 720 hour(s))  Blood culture (routine single)     Status: None   Collection Time: 10/25/20  6:35 PM   Specimen: Right Antecubital; Blood  Result Value Ref Range Status   Specimen Description   Final    RIGHT ANTECUBITAL BOTTLES DRAWN AEROBIC AND ANAEROBIC Blood Culture adequate volume   Special Requests NONE  Final   Culture   Final    NO GROWTH 5 DAYS Performed at [facility], [address], [place], [place] [address]    Report Status 10/30/2020 FINAL  Final  Resp Panel by RT-PCR (Flu A&B, Covid) Nasopharyngeal Swab     Status: None   Collection Time: 10/25/20  6:37 PM   Specimen: Nasopharyngeal Swab; Nasopharyngeal(NP) swabs in vial transport medium  Result Value Ref Range Status   SARS Coronavirus 2 by RT PCR NEGATIVE NEGATIVE Final    Comment: (NOTE) SARS-CoV-2 target nucleic acids are NOT DETECTED.  The SARS-CoV-2 RNA is generally detectable in upper respiratory specimens during the acute phase of infection. The lowest concentration of SARS-CoV-2 viral copies this assay can detect is 138 copies/mL. A negative result does not preclude SARS-Cov-2 infection and should not be used as the sole basis for treatment or other patient management decisions. A negative result may occur with  improper specimen collection/handling, submission of specimen other than nasopharyngeal swab, presence of viral mutation(s) within the areas targeted by this assay, and inadequate number of viral copies(<138 copies/mL). A negative result must be combined with clinical observations, patient history, and epidemiological information. The expected result is Negative.  Fact Sheet for  Patients:  EntrepreneurPulse.com.au  Fact Sheet for Healthcare Providers:  IncredibleEmployment.be  This test is no t yet approved or cleared by the [place] FDA and  has been authorized for detection and/or diagnosis of SARS-CoV-2 by FDA under an Emergency Use Authorization (EUA). This EUA will remain  in effect (meaning this test can be used) for the duration of the COVID-19 declaration under Section 564(b)(1) of the Act, 21 U.S.C.section 360bbb-3(b)(1), unless the authorization is terminated  or revoked sooner.       Influenza A by PCR NEGATIVE NEGATIVE Final   Influenza B by PCR NEGATIVE NEGATIVE Final    Comment: (NOTE) The Xpert Xpress SARS-CoV-2/FLU/RSV plus assay is intended as an aid in the diagnosis of influenza from Nasopharyngeal swab specimens and should not be used as a sole basis for treatment. Nasal washings and aspirates are unacceptable for Xpert Xpress SARS-CoV-2/FLU/RSV testing.  Fact Sheet for Patients: EntrepreneurPulse.com.au  Fact Sheet for Healthcare Providers: IncredibleEmployment.be  This test is not yet approved or cleared by the [place] FDA and has been authorized for detection and/or diagnosis of SARS-CoV-2 by FDA under an Emergency Use Authorization (EUA). This EUA will remain in effect (meaning this test can be used) for the duration of the COVID-19 declaration under Section 564(b)(1) of the Act, 21 U.S.C. section 360bbb-3(b)(1), unless the authorization is terminated or revoked.  Performed at [facility] [address], [place], [place] [address]   Urine culture     Status: Abnormal   Collection Time: 10/25/20 10:10 PM   Specimen: In/Out Cath Urine  Result Value  Ref Range Status   Specimen Description   Final    IN/OUT CATH URINE Performed at [facility], [address], [place], [place] [address]    Special Requests   Final    NONE Performed at [facility], [address], [place], [place] [address]    Culture 40,000 COLONIES/mL ENTEROBACTER AEROGENES (A)  Final   Report Status 10/28/2020 FINAL  Final   Organism ID, Bacteria ENTEROBACTER AEROGENES (A)  Final      Susceptibility   Enterobacter aerogenes - MIC*    CEFAZOLIN >=64 RESISTANT Resistant     CEFEPIME <=0.12 SENSITIVE Sensitive     CEFTRIAXONE <=0.25 SENSITIVE Sensitive     CIPROFLOXACIN <=0.25 SENSITIVE Sensitive     GENTAMICIN <=1 SENSITIVE Sensitive     IMIPENEM 1 SENSITIVE Sensitive     NITROFURANTOIN 64 INTERMEDIATE Intermediate     TRIMETH/SULFA <=20 SENSITIVE Sensitive     PIP/TAZO <=4 SENSITIVE Sensitive     * 40,000 COLONIES/mL ENTEROBACTER AEROGENES  MRSA PCR Screening     Status: None   Collection Time: 10/26/20  6:42 PM   Specimen: Nasal Mucosa; Nasopharyngeal  Result Value Ref Range Status   MRSA by PCR NEGATIVE NEGATIVE Final    Comment:        The GeneXpert MRSA Assay (FDA approved for NASAL specimens only), is one component of a comprehensive MRSA colonization surveillance program. It is not intended to diagnose MRSA infection nor to guide or monitor treatment for MRSA infections. Performed at [facility], [address] [place], [place] [address]     Studies/Results: No results found.   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 ID was consulted for management of his antibiotics.  Flagyl 1/17 >> Ceftriaxone 1/21 >> 1/23, Restarted 1/27>> Daptomycin 1/27 >>   Inflammatory markers mildly elevated. Leukocytosis trending down.   Continue PO Flagyl 500 mg q8h, IV daptomycin 670 mg daily and IV ceftriaxone 2 g q12h x2-week.   Emphasized to patient the fact that antibiotics will not treat his foot wound due to his poor circulation.   Recommend follow up with palliative care to decide on goals of care.   Consider re-consulting wound care   LOS: 13 days   Lacinda Axon 11/07/2020, 2:31 PM

## 2020-11-07 NOTE — Progress Notes (Signed)
Palliative Medicine RN Note: Chart review done. Note decisions re surgery vs abx. PMT NP Sharyn Lull is back tomorrow and is familiar to Mr Deren and his wife. We will have her follow up tomorrow.  Marjie Skiff Diamantina Edinger, RN, BSN, [facility] Palliative Medicine Team 11/07/2020 11:13 AM Office [phone]

## 2020-11-07 NOTE — Plan of Care (Signed)

## 2020-11-07 NOTE — Progress Notes (Signed)
Pharmacy Antibiotic Note  [name] is a 70 y.o. male admitted on 10/25/2020 with L foot osteomyelitis. Family initially refusing amputation then agreed but now refusing again. ID consulted and recommended daptomycin and ceftriaxone.  Pharmacy has been consulted for daptomycin dosing.  Will initiate daptomycin 670mg  (~8 mg/kg adjusted BW per BMI > 30) once daily. Will check baseline CK and subsequent weekly CK as patient remains on daptomycin. ID plans to treat with ceftriaxone and metronidazole PO for 2 weeks. Patient has already received ~2 weeks of IV and PO antibiotics. Of note, patient is also on atorvastatin 40mg  daily. Will monitor CK and any signs of myalgias/rhabdo closely.   Plan: Discontinue SMX/TMP Initiate daptomycin 670mg  once daily Continue ceftriaxone and metronidazole PO per ID Check baseline and weekly CK, follow Scr, cultures, and ID/primary recommendations   Height: 5\' 7"  (170.2 cm) Weight: 110.3 kg (243 lb 2.7 oz) IBW/kg (Calculated) : 66.1  Temp (24hrs), Avg:98.4 F (36.9 C), Min:98 F (36.7 C), Max:99.2 F (37.3 C)  Recent Labs  Lab 11/01/20 0317 11/02/20 0435 11/03/20 0605 11/04/20 0449 11/04/20 1031 11/05/20 0510 11/06/20 0500 11/07/20 0500  WBC  --   --  18.8*  --  16.7* 18.1* 17.4* 15.8*  CREATININE 1.08   < > 0.95 0.96  --  0.94 0.86 0.93  LATICACIDVEN 1.5  --   --   --   --   --   --   --    < > = values in this interval not displayed.    Estimated Creatinine Clearance: 90.1 mL/min (by C-G formula based on SCr of 0.93 mg/dL).    Allergies  Allergen Reactions  . Flexeril [Cyclobenzaprine] Shortness Of Breath  . Tamsulosin Hcl Swelling, Rash and Other (See Comments)    Swelling around the mouth with rash on face Swelling around the mouth with rash on face Swelling around the mouth with rash on face  Swelling around the mouth with rash on face  . Tramadol Other (See Comments)    Headache  . Robaxin [Methocarbamol] Rash  . Aspirin      On plavix  . Morphine And Related Itching    Antimicrobials this admission: Vanc 1/14 >> 1/21 Cefepime 1/14 >> 1/21 Flagyl 1/17 >> CTX 1/21 >> 1/23, restart 1/27>> SMX/TMP 1/23>>1/27 Daptomycin 1/27>>  Microbiology results: 1/15 BCx - negative 1/16 UCx - 40K Enterobacter (R-cefaz) 1/15 MRSA PCR - negative  Thank you for allowing pharmacy to be a part of this patient's care.  Alfonse Spruce, PharmD PGY2 ID Pharmacy Resident [phone]  11/07/2020 8:08 AM

## 2020-11-07 NOTE — Progress Notes (Signed)
   INFECTIOUS DISEASE ATTENDING ADDENDUM:   This patient has been seen and discussed with the house staff. Please see the resident's note for complete details. I have reviewed the pertinent  laboratory and radiographic data and examined the patient independently.  I concur with their findings with the following additions/corrections:  70 year old with multiple medical problems with osteomyelitis that will need AKA for cure.  We want to emphasize that IV antibiotics will not cure this problem, PERIOD.   They can be given and then  lifelong suppression with oral antibiotics can be attempted, but they are ultimately not going to build to keep this under control and he will be readmitted again with sepsis at some point in time.  If cure of his infection is the goal then he needs to go ahead and get an above-the-knee amputation.  We are happy to work with the patient and the team regardless of what decision he makes.  Currently we will continue daptomycin and ceftriaxone along with oral metronidazole.

## 2020-11-07 NOTE — Progress Notes (Signed)
PROGRESS NOTE    [name]  GQQ:[id number] DOB: 1951-03-18 DOA: 10/25/2020 PCP: Leeanne Rio, MD   Chief Complaint  Patient presents with  . Foot Pain  . Foot Ulcer  Brief Narrative: 70 year old male with hypertension HLD, T2DM, a flutter, CAD, history of a stroke with left hemiparesis admitted with left foot wound from recent trauma.  Patient Dr Marc Morgans for antibiotics at [facility] vascular surgery was consulted and transferred to [facility] 1/17-but patient refused amputation. Patient had complicated hospitalization with acute hypoxic respiratory failure acute on chronic systolic CHF.  Seen by cardiology and vascular.  Palliative care was consulted since he initially refused amputation. After family meeting with palliative care 11/04/20 patient and wife wanted to proceed with amputation-vascular surgery and cardiology were notified. 1/26: Family again decided against surgery ID was consulted  Subjective: Patient is alert awake with baseline confusion. Mild tachycardia, on room air. Wbc 15k improving.  Assessment & Plan:  Severe sepsis POA due to left foot osteomyelitis/left foot osteomyelitis: Initially refused amputation now family wants to proceed with surgery, vascular surgery aware and on board, cardiology has seen in preop evaluation -and okay to proceed with surgery.  Again family decided against surgery 1/26.  WBC remains up, CRP 2.3.  ID was consulted 1/26-Bactrim discontinued, advised Flagyl x2 weeks and started IV daptomycin 8 mg/kg daily and IV ceftriaxone 2 g every 24 hours x2-week.  IV Ancef 2 g for surgical prophylaxis if family chooses to go ahead with left AKA.   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  Continue oral Lasix Toprol.   Acute hypoxic respiratory failure doing well on room air.  PAF/a flutter with RVR mild tachycardia continue on Toprol.  Continue Lovenox and amiodarone.  Patient will need long-term DOAC given chads 2 vasc score of 8 as per cardio.   Mildly positive troponin subtle elevation with flat trend EKG nonischemic per cardiology no further recommendation.  Insulin-dependent type 2 diabetes mellitus, HbA1c 6.9.  Sugar is fairly controlled after increasing Lantus, continue at 8 units, continue sliding scale insulin Recent Labs  Lab 11/06/20 0828 11/06/20 1237 11/06/20 1538 11/06/20 2123 11/07/20 0744  GLUCAP 156* 206* 274* 253* 168*   Hypokalemia/magnesium -:k stable on daily supplement.    CAD multivessel with history of CABG  12/2013/ hyperlipidemia: cont his Lipitor, Plavix, metoprolol.  History of CVA with left hemiparesis/PAD/HTN:.  No chest pain.  Continue statin/Plavix.    CKD stage II, renal function is stable  Recent Labs  Lab 11/03/20 0605 11/04/20 0449 11/05/20 0510 11/06/20 0500 11/07/20 0500  BUN 11 11 12 10 10   CREATININE 0.95 0.96 0.94 0.86 0.93   Dementia:on Aricept/Namenda.  Supportive care,fall precautions GERD- cont ppi BPH-continuous finasteride  Goals of care, counseling/discussion: Palliative care has been following very closely appreciate input.  After extensive family discussion they would like to proceed with amputation.  Patient is DNR. Again family deciding against surgery, awaiting for further palliative discussion with the family for further plan of care, if not then SNF with iv antibiotics.  Morbid obesity with body mass index is 38.09 kg/m.  PCP follow-up  weight loss  Nutrition: Diet Order            DIET DYS 2 Room service appropriate? Yes; Fluid consistency: Thin  Diet effective now                DVT prophylaxis: SCDs Start: 10/25/20 2246 Code Status:   Code  Status: DNR  Family Communication: plan of care discussed with patient at bedside.  Status is: Inpatient Remains inpatient appropriate because:Unsafe d/c plan and Inpatient level of care appropriate due to severity of illness  Dispo: The patient is from: Home              Anticipated d/c is to: TBD-awaiting for palliative care discussion with the family-patient and family does not want to proceed with amputation and now patient is placed on IV antibiotics which will need x2 weeks.                Anticipated d/c date is:2 days              Patient currently is not medically stable to d/c.   Difficult to place patient Yes  Consultants:see note  Procedures:see note  Culture/Microbiology    Component Value Date/Time   SDES  10/25/2020 2210    IN/OUT CATH URINE Performed at [facility], [address], [place], [place] [address]    [facility]  10/25/2020 2210    NONE Performed at [facility], [address] [place], [place] [address]    CULT 40,000 COLONIES/mL ENTEROBACTER AEROGENES (A) 10/25/2020 2210   REPTSTATUS 10/28/2020 FINAL 10/25/2020 2210    Other culture-see note  Medications: Scheduled Meds: . amiodarone  200 mg Oral Daily  . atorvastatin  40 mg Oral Daily  . baclofen  5 mg Oral TID  . Chlorhexidine Gluconate Cloth  6 each Topical Daily  . cholecalciferol  5,000 Units Oral Q1200  . clopidogrel  75 mg Oral Daily  . donepezil  5 mg Oral QHS  . enoxaparin (LOVENOX) injection  110 mg Subcutaneous BID  . feeding supplement (GLUCERNA SHAKE)  237 mL Oral TID BM  . ferrous sulfate  325 mg Oral BID WC  . finasteride  5 mg Oral Q1200  . furosemide  40 mg Oral Daily  . gabapentin  600 mg Oral TID  . Gerhardt's butt cream   Topical QID  . insulin aspart  0-15 Units Subcutaneous TID WC  . insulin aspart  0-5 Units Subcutaneous QHS  . insulin glargine  8 Units Subcutaneous QHS  . loratadine  5 mg Oral QHS  . memantine  5 mg Oral BID  . metoprolol succinate  25 mg Oral  Daily  . metroNIDAZOLE  500 mg Oral Q8H  . pantoprazole  40 mg Oral Daily  . potassium chloride  20 mEq Oral Daily  . sodium chloride flush  10-40 mL Intracatheter Q12H   Continuous Infusions: . cefTRIAXone (ROCEPHIN)  IV 2 g (11/07/20 1027)  . DAPTOmycin (CUBICIN)  IV      Antimicrobials: Anti-infectives (From admission, onward)   Start     Dose/Rate Route Frequency Ordered Stop   11/07/20 1000  cefTRIAXone (ROCEPHIN) 2 g in sodium chloride 0.9 % 100 mL IVPB        2 g 200 mL/hr over 30 Minutes Intravenous Every 24 hours 11/07/20 0742     11/07/20 1000  DAPTOmycin (CUBICIN) 670 mg in sodium chloride 0.9 % IVPB        670 mg 226.8 mL/hr over 30  Minutes Intravenous Daily 11/07/20 0746     11/03/20 2200  metroNIDAZOLE (FLAGYL) tablet 500 mg        500 mg Oral Every 8 hours 11/03/20 1554     11/03/20 2000  sulfamethoxazole-trimethoprim (BACTRIM DS) 800-160 MG per tablet 1 tablet  Status:  Discontinued        1 tablet Oral Every 12 hours 11/03/20 1554 11/07/20 0742   11/01/20 0915  cefTRIAXone (ROCEPHIN) 2 g in sodium chloride 0.9 % 100 mL IVPB  Status:  Discontinued        2 g 200 mL/hr over 30 Minutes Intravenous Every 24 hours 11/01/20 0816 11/03/20 1551   10/29/20 1400  vancomycin (VANCOREADY) IVPB 750 mg/150 mL  Status:  Discontinued        750 mg 150 mL/hr over 60 Minutes Intravenous Every 12 hours 10/29/20 1238 11/01/20 0816   10/28/20 1400  vancomycin (VANCOCIN) IVPB 1000 mg/200 mL premix  Status:  Discontinued        1,000 mg 200 mL/hr over 60 Minutes Intravenous Every 12 hours 10/28/20 1344 10/29/20 1238   10/28/20 0900  metroNIDAZOLE (FLAGYL) IVPB 500 mg  Status:  Discontinued        500 mg 100 mL/hr over 60 Minutes Intravenous Every 8 hours 10/28/20 0808 11/03/20 1551   10/26/20 1000  vancomycin (VANCOCIN) IVPB 1000 mg/200 mL premix  Status:  Discontinued        1,000 mg 200 mL/hr over 60 Minutes Intravenous Every 12 hours 10/25/20 2037 10/28/20 1344   10/25/20 2200   ceFEPIme (MAXIPIME) 2 g in sodium chloride 0.9 % 100 mL IVPB  Status:  Discontinued        2 g 200 mL/hr over 30 Minutes Intravenous Every 8 hours 10/25/20 2037 11/01/20 0816   10/25/20 2030  vancomycin (VANCOREADY) IVPB 2000 mg/400 mL        2,000 mg 200 mL/hr over 120 Minutes Intravenous NOW 10/25/20 2027 10/25/20 2248   10/25/20 1930  vancomycin (VANCOCIN) IVPB 1000 mg/200 mL premix  Status:  Discontinued        1,000 mg 200 mL/hr over 60 Minutes Intravenous  Once 10/25/20 1928 10/25/20 2136   10/25/20 1930  cefTRIAXone (ROCEPHIN) 2 g in sodium chloride 0.9 % 100 mL IVPB        2 g 200 mL/hr over 30 Minutes Intravenous  Once 10/25/20 1928 10/25/20 2040     Objective: Vitals: Today's Vitals   11/07/20 0000 11/07/20 0400 11/07/20 0745 11/07/20 0845  BP: 122/73 116/68 121/83   Pulse: (!) 108 (!) 109 (!) 110   Resp: 14 14 13    Temp: 98.2 F (36.8 C) 98 F (36.7 C) 98 F (36.7 C)   TempSrc: Oral Oral Axillary   SpO2: 91% 92% 94%   Weight:      Height:      PainSc:    Asleep    Intake/Output Summary (Last 24 hours) at 11/07/2020 1140 Last data filed at 11/06/2020 2234 Gross per 24 hour  Intake 130 ml  Output 1200 ml  Net -1070 ml   Filed Weights   10/25/20 1824 10/26/20 1850 10/27/20 0500  Weight: 116.1 kg 110.5 kg 110.3 kg   Weight change:   Intake/Output from previous day: 01/26 0701 - 01/27 0700 In: 130 [P.O.:120; I.V.:10] Out: 1200 [Urine:1200] Intake/Output this shift: No intake/output data recorded. Filed Weights   10/25/20 1824 10/26/20 1850 10/27/20 0500  Weight: 116.1 kg 110.5 kg 110.3 kg    Examination: General  exam: AAO with baseline dementia, NAD, weak appearing. HEENT:Oral mucosa moist, Ear/Nose WNL grossly, dentition normal. Respiratory system: bilaterally diminished,no wheezing or crackles,no use of accessory muscle Cardiovascular system: S1 & S2 +, No JVD,. Gastrointestinal system: Abdomen soft, NT,ND, BS+ Nervous System:Alert, awake, moving  extremities and grossly nonfocal Extremities: LE edema, distal peripheral pulses palpable.  Skin: No rashes,no icterus. MSK: Normal muscle bulk,tone, power Foot wounds with dressing intact offloading boot in place.  Data Reviewed: I have personally reviewed following labs and imaging studies CBC: Recent Labs  Lab 11/03/20 0605 11/04/20 1031 11/05/20 0510 11/06/20 0500 11/07/20 0500  WBC 18.8* 16.7* 18.1* 17.4* 15.8*  HGB 11.3* 11.4* 11.1* 11.1* 11.4*  HCT 35.7* 34.2* 34.7* 33.8* 35.6*  MCV 94.4 94.0 95.3 94.7 96.0  PLT 212 204 193 221 [id number]   Basic Metabolic Panel: Recent Labs  Lab 11/01/20 0317 11/02/20 0435 11/03/20 0605 11/04/20 0449 11/05/20 0510 11/06/20 0500 11/07/20 0500  NA 145 147* 139 140 138 139 141  K 3.2* 3.1* 3.0* 3.8 3.6 3.4* 4.0  CL 111 111 105 109 105 107 107  CO2 24 25 24 25 24 24 25   GLUCOSE 199* 196* 216* 181* 130* 143* 141*  BUN 20 18 11 11 12 10 10   CREATININE 1.08 0.98 0.95 0.96 0.94 0.86 0.93  CALCIUM 7.5* 7.6* 7.5* 7.7* 7.7* 7.6* 7.9*  MG 1.8 2.1  --  1.9  --   --   --    GFR: Estimated Creatinine Clearance: 90.1 mL/min (by C-G formula based on SCr of 0.93 mg/dL). Liver Function Tests: No results for input(s): AST, ALT, ALKPHOS, BILITOT, PROT, ALBUMIN in the last 168 hours. No results for input(s): LIPASE, AMYLASE in the last 168 hours. No results for input(s): AMMONIA in the last 168 hours. Coagulation Profile: No results for input(s): INR, PROTIME in the last 168 hours. Cardiac Enzymes: Recent Labs  Lab 11/07/20 0500  CKTOTAL 157   BNP (last 3 results) No results for input(s): PROBNP in the last 8760 hours. HbA1C: No results for input(s): HGBA1C in the last 72 hours. CBG: Recent Labs  Lab 11/06/20 0828 11/06/20 1237 11/06/20 1538 11/06/20 2123 11/07/20 0744  GLUCAP 156* 206* 274* 253* 168*   Lipid Profile: No results for input(s): CHOL, HDL, LDLCALC, TRIG, CHOLHDL, LDLDIRECT in the last 72 hours. Thyroid Function  Tests: No results for input(s): TSH, T4TOTAL, FREET4, T3FREE, THYROIDAB in the last 72 hours. Anemia Panel: No results for input(s): VITAMINB12, FOLATE, FERRITIN, TIBC, IRON, RETICCTPCT in the last 72 hours. Sepsis Labs: Recent Labs  Lab 11/01/20 0317  LATICACIDVEN 1.5    No results found for this or any previous visit (from the past 240 hour(s)).   Radiology Studies: No results found.   LOS: 13 days   Antonieta Pert, MD [facility]  11/07/2020, 11:40 AM

## 2020-11-08 ENCOUNTER — Encounter (HOSPITAL_COMMUNITY)
Admission: EM | Disposition: A | Discharge status: Skilled Nursing Facility | Payer: Self-pay | Source: Home / Self Care | Attending: Internal Medicine

## 2020-11-08 DIAGNOSIS — Z66 Do not resuscitate: Secondary | ICD-10-CM | POA: Diagnosis not present

## 2020-11-08 DIAGNOSIS — A419 Sepsis, unspecified organism: Secondary | ICD-10-CM | POA: Diagnosis not present

## 2020-11-08 DIAGNOSIS — N400 Enlarged prostate without lower urinary tract symptoms: Secondary | ICD-10-CM | POA: Diagnosis not present

## 2020-11-08 DIAGNOSIS — M86172 Other acute osteomyelitis, left ankle and foot: Secondary | ICD-10-CM | POA: Diagnosis not present

## 2020-11-08 DIAGNOSIS — I483 Typical atrial flutter: Secondary | ICD-10-CM | POA: Diagnosis not present

## 2020-11-08 DIAGNOSIS — Z7189 Other specified counseling: Secondary | ICD-10-CM | POA: Diagnosis not present

## 2020-11-08 DIAGNOSIS — Z515 Encounter for palliative care: Secondary | ICD-10-CM | POA: Diagnosis not present

## 2020-11-08 LAB — BASIC METABOLIC PANEL
Anion gap: 10 (ref 5–15)
BUN: 12 mg/dL (ref 8–23)
CO2: 23 mmol/L (ref 22–32)
Calcium: 7.8 mg/dL — ABNORMAL LOW (ref 8.9–10.3)
Chloride: 107 mmol/L (ref 98–111)
Creatinine, Ser: 1.02 mg/dL (ref 0.61–1.24)
GFR, Estimated: >60 mL/min (ref >60)
Glucose, Bld: 147 mg/dL — ABNORMAL HIGH (ref 70–99)
Potassium: 3.8 mmol/L (ref 3.5–5.1)
Sodium: 140 mmol/L (ref 135–145)

## 2020-11-08 LAB — GLUCOSE, CAPILLARY
Glucose-Capillary: 145 mg/dL — ABNORMAL HIGH (ref 70–99)
Glucose-Capillary: 167 mg/dL — ABNORMAL HIGH (ref 70–99)
Glucose-Capillary: 226 mg/dL — ABNORMAL HIGH (ref 70–99)
Glucose-Capillary: 223 mg/dL — ABNORMAL HIGH (ref 70–99)

## 2020-11-08 LAB — CBC
HCT: 33.9 % — ABNORMAL LOW (ref 39.0–52.0)
Hemoglobin: 10.6 g/dL — ABNORMAL LOW (ref 13.0–17.0)
MCH: 30.6 pg (ref 26.0–34.0)
MCHC: 31.3 g/dL (ref 30.0–36.0)
MCV: 98 fL (ref 80.0–100.0)
Platelets: 269 10*3/uL (ref 150–400)
RBC: 3.46 MIL/uL — ABNORMAL LOW (ref 4.22–5.81)
RDW: 18.7 % — ABNORMAL HIGH (ref 11.5–15.5)
WBC: 14.7 10*3/uL — ABNORMAL HIGH (ref 4.0–10.5)
nRBC: 0 % (ref 0.0–0.2)

## 2020-11-08 SURGERY — AMPUTATION, ABOVE KNEE
Anesthesia: General | Site: Knee | Laterality: Left

## 2020-11-08 MED ORDER — INSULIN GLARGINE 100 UNIT/ML ~~LOC~~ SOLN
10.0000 [IU] | Freq: Every day | SUBCUTANEOUS | Status: DC
  Administered 2020-11-08 – 2020-11-28 (×21): 10 [IU] via SUBCUTANEOUS
  Filled 2020-11-08 (×24): qty 0.1

## 2020-11-08 NOTE — Progress Notes (Signed)
PHARMACY CONSULT NOTE FOR:  OUTPATIENT  PARENTERAL ANTIBIOTIC THERAPY (OPAT)  Indication: osteomyelitis Regimen: Daptomycin 670mg  IV q24h + Ceftriaxone 2g IV q24h End date: 12/05/2020  IV antibiotic discharge orders are pended. To discharging provider:  please sign these orders via discharge navigator,  Select New Orders & click on the button choice - Manage This Unsigned Work.     Arturo Morton, PharmD, BCPS Please check AMION for all [facility] contact numbers Clinical Pharmacist 11/08/2020 11:40 AM

## 2020-11-08 NOTE — Progress Notes (Signed)
Subjective: No new complaints.  Patient was assessed this morning laying comfortably in bed.  He denies any fever, chest pain or shortness of breath.  He does endorse continued leg pain that is being managed with pain medication.  States they have a meeting this afternoon to decide if he wants to get surgery or continue long-term antibiotics.  Antibiotics:  Anti-infectives (From admission, onward)   Start     Dose/Rate Route Frequency Ordered Stop   11/07/20 1000  cefTRIAXone (ROCEPHIN) 2 g in sodium chloride 0.9 % 100 mL IVPB        2 g 200 mL/hr over 30 Minutes Intravenous Every 24 hours 11/07/20 0742     11/07/20 1000  DAPTOmycin (CUBICIN) 670 mg in sodium chloride 0.9 % IVPB        670 mg 226.8 mL/hr over 30 Minutes Intravenous Daily 11/07/20 0746     11/03/20 2200  metroNIDAZOLE (FLAGYL) tablet 500 mg        500 mg Oral Every 8 hours 11/03/20 1554     11/03/20 2000  sulfamethoxazole-trimethoprim (BACTRIM DS) 800-160 MG per tablet 1 tablet  Status:  Discontinued        1 tablet Oral Every 12 hours 11/03/20 1554 11/07/20 0742   11/01/20 0915  cefTRIAXone (ROCEPHIN) 2 g in sodium chloride 0.9 % 100 mL IVPB  Status:  Discontinued        2 g 200 mL/hr over 30 Minutes Intravenous Every 24 hours 11/01/20 0816 11/03/20 1551   10/29/20 1400  vancomycin (VANCOREADY) IVPB 750 mg/150 mL  Status:  Discontinued        750 mg 150 mL/hr over 60 Minutes Intravenous Every 12 hours 10/29/20 1238 11/01/20 0816   10/28/20 1400  vancomycin (VANCOCIN) IVPB 1000 mg/200 mL premix  Status:  Discontinued        1,000 mg 200 mL/hr over 60 Minutes Intravenous Every 12 hours 10/28/20 1344 10/29/20 1238   10/28/20 0900  metroNIDAZOLE (FLAGYL) IVPB 500 mg  Status:  Discontinued        500 mg 100 mL/hr over 60 Minutes Intravenous Every 8 hours 10/28/20 0808 11/03/20 1551   10/26/20 1000  vancomycin (VANCOCIN) IVPB 1000 mg/200 mL premix  Status:  Discontinued        1,000 mg 200 mL/hr over 60 Minutes  Intravenous Every 12 hours 10/25/20 2037 10/28/20 1344   10/25/20 2200  ceFEPIme (MAXIPIME) 2 g in sodium chloride 0.9 % 100 mL IVPB  Status:  Discontinued        2 g 200 mL/hr over 30 Minutes Intravenous Every 8 hours 10/25/20 2037 11/01/20 0816   10/25/20 2030  vancomycin (VANCOREADY) IVPB 2000 mg/400 mL        2,000 mg 200 mL/hr over 120 Minutes Intravenous NOW 10/25/20 2027 10/25/20 2248   10/25/20 1930  vancomycin (VANCOCIN) IVPB 1000 mg/200 mL premix  Status:  Discontinued        1,000 mg 200 mL/hr over 60 Minutes Intravenous  Once 10/25/20 1928 10/25/20 2136   10/25/20 1930  cefTRIAXone (ROCEPHIN) 2 g in sodium chloride 0.9 % 100 mL IVPB        2 g 200 mL/hr over 30 Minutes Intravenous  Once 10/25/20 1928 10/25/20 2040      Medications: Scheduled Meds: . amiodarone  200 mg Oral Daily  . atorvastatin  40 mg Oral Daily  . baclofen  5 mg Oral TID  . Chlorhexidine Gluconate Cloth  6  each Topical Daily  . cholecalciferol  5,000 Units Oral Q1200  . clopidogrel  75 mg Oral Daily  . donepezil  5 mg Oral QHS  . enoxaparin (LOVENOX) injection  110 mg Subcutaneous BID  . feeding supplement (GLUCERNA SHAKE)  237 mL Oral TID BM  . ferrous sulfate  325 mg Oral BID WC  . finasteride  5 mg Oral Q1200  . furosemide  40 mg Oral Daily  . gabapentin  600 mg Oral TID  . Gerhardt's butt cream   Topical QID  . insulin aspart  0-15 Units Subcutaneous TID WC  . insulin aspart  0-5 Units Subcutaneous QHS  . insulin glargine  10 Units Subcutaneous QHS  . loratadine  5 mg Oral QHS  . memantine  5 mg Oral BID  . metoprolol succinate  25 mg Oral Daily  . metroNIDAZOLE  500 mg Oral Q8H  . pantoprazole  40 mg Oral Daily  . potassium chloride  20 mEq Oral Daily  . sodium chloride flush  10-40 mL Intracatheter Q12H   Continuous Infusions: . cefTRIAXone (ROCEPHIN)  IV Stopped (11/07/20 1058)  . DAPTOmycin (CUBICIN)  IV Stopped (11/07/20 1253)   PRN Meds:.acetaminophen, haloperidol lactate,  levalbuterol, ondansetron (ZOFRAN) IV, oxyCODONE-acetaminophen, sodium chloride flush   Objective: Weight change:   Intake/Output Summary (Last 24 hours) at 11/08/2020 0819 Last data filed at 11/07/2020 1405 Gross per 24 hour  Intake 800 ml  Output --  Net 800 ml   Blood pressure 106/73, pulse 100, temperature 98.6 F (37 C), temperature source Oral, resp. rate 18, height 5\' 7"  (1.702 m), weight 110.3 kg, SpO2 94 %. Temp:  [98 F (36.7 C)-98.6 F (37 C)] 98.6 F (37 C) (01/28 0800) Pulse Rate:  [100-110] 100 (01/28 0800) Resp:  [12-24] 18 (01/28 0800) BP: (103-122)/(59-75) 106/73 (01/28 0800) SpO2:  [94 %-98 %] 94 % (01/28 0800)  Physical Exam: Physical Exam Constitutional:      Appearance: He is obese.  HENT:     Head: Normocephalic and atraumatic.  Cardiovascular:     Rate and Rhythm: Regular rhythm. Tachycardia present.     Heart sounds: Normal heart sounds.     Comments: Faint R dorsalis pedis pulses. Nonpalpable L dorsalis pedis pulses.  Pulmonary:     Effort: Pulmonary effort is normal.     Breath sounds: Normal breath sounds.  Abdominal:     General: Bowel sounds are normal. There is no distension.     Palpations: Abdomen is soft.     Tenderness: There is no abdominal tenderness.  Musculoskeletal:        General: Normal range of motion.     Comments: Left foot wounds wrapped. BLE in pressure boots.   Skin:    General: Skin is warm and dry.  Neurological:     General: No focal deficit present.     Mental Status: He is alert.  Psychiatric:        Mood and Affect: Mood normal.     Comments: Some difficulty with articulating words.      CBC: CBC Latest Ref Rng & Units 11/08/2020 11/07/2020 11/06/2020  WBC 4.0 - 10.5 K/uL 14.7(H) 15.8(H) 17.4(H)  Hemoglobin 13.0 - 17.0 g/dL 10.6(L) 11.4(L) 11.1(L)  Hematocrit 39.0 - 52.0 % 33.9(L) 35.6(L) 33.8(L)  Platelets 150 - 400 K/uL 269 263 221    BMET Recent Labs    11/07/20 0500 11/08/20 0500  NA 141 140  K  4.0 3.8  CL 107 107  CO2  25 23  GLUCOSE 141* 147*  BUN 10 12  CREATININE 0.93 1.02  CALCIUM 7.9* 7.8*     Liver Panel  No results for input(s): PROT, ALBUMIN, AST, ALT, ALKPHOS, BILITOT, BILIDIR, IBILI in the last 72 hours.   Sedimentation Rate Recent Labs    11/06/20 1515  ESRSEDRATE 52*   C-Reactive Protein Recent Labs    11/06/20 1515  CRP 2.3*    Micro Results: Recent Results (from the past 720 hour(s))  Blood culture (routine single)     Status: None   Collection Time: 10/25/20  6:35 PM   Specimen: Right Antecubital; Blood  Result Value Ref Range Status   Specimen Description   Final    RIGHT ANTECUBITAL BOTTLES DRAWN AEROBIC AND ANAEROBIC Blood Culture adequate volume   Special Requests NONE  Final   Culture   Final    NO GROWTH 5 DAYS Performed at [facility], [address] [place], [place] [address]    Report Status 10/30/2020 FINAL  Final  Resp Panel by RT-PCR (Flu A&B, Covid) Nasopharyngeal Swab     Status: None   Collection Time: 10/25/20  6:37 PM   Specimen: Nasopharyngeal Swab; Nasopharyngeal(NP) swabs in vial transport medium  Result Value Ref Range Status   SARS Coronavirus 2 by RT PCR NEGATIVE NEGATIVE Final    Comment: (NOTE) SARS-CoV-2 target nucleic acids are NOT DETECTED.  The SARS-CoV-2 RNA is generally detectable in upper respiratory specimens during the acute phase of infection. The lowest concentration of SARS-CoV-2 viral copies this assay can detect is 138 copies/mL. A negative result does not preclude SARS-Cov-2 infection and should not be used as the sole basis for treatment or other patient management decisions. A negative result may occur with  improper specimen collection/handling, submission of specimen other than nasopharyngeal swab, presence of viral mutation(s) within the areas targeted by this assay, and inadequate number of viral copies(<138 copies/mL). A negative result must be combined with clinical observations,  patient history, and epidemiological information. The expected result is Negative.  Fact Sheet for Patients:  EntrepreneurPulse.com.au  Fact Sheet for Healthcare Providers:  IncredibleEmployment.be  This test is no t yet approved or cleared by the [place] FDA and  has been authorized for detection and/or diagnosis of SARS-CoV-2 by FDA under an Emergency Use Authorization (EUA). This EUA will remain  in effect (meaning this test can be used) for the duration of the COVID-19 declaration under Section 564(b)(1) of the Act, 21 U.S.C.section 360bbb-3(b)(1), unless the authorization is terminated  or revoked sooner.       Influenza A by PCR NEGATIVE NEGATIVE Final   Influenza B by PCR NEGATIVE NEGATIVE Final    Comment: (NOTE) The Xpert Xpress SARS-CoV-2/FLU/RSV plus assay is intended as an aid in the diagnosis of influenza from Nasopharyngeal swab specimens and should not be used as a sole basis for treatment. Nasal washings and aspirates are unacceptable for Xpert Xpress SARS-CoV-2/FLU/RSV testing.  Fact Sheet for Patients: EntrepreneurPulse.com.au  Fact Sheet for Healthcare Providers: IncredibleEmployment.be  This test is not yet approved or cleared by the [place] FDA and has been authorized for detection and/or diagnosis of SARS-CoV-2 by FDA under an Emergency Use Authorization (EUA). This EUA will remain in effect (meaning this test can be used) for the duration of the COVID-19 declaration under Section 564(b)(1) of the Act, 21 U.S.C. section 360bbb-3(b)(1), unless the authorization is terminated or revoked.  Performed at [facility], [address], [place], [place] [address]   Urine  culture     Status: Abnormal   Collection Time: 10/25/20 10:10 PM   Specimen: In/Out Cath Urine  Result Value Ref Range Status   Specimen Description   Final    IN/OUT CATH URINE Performed at [facility], [address] [place], [place] [address]    Special Requests   Final    NONE Performed at [facility], [address] [place], [place] [address]    Culture 40,000 COLONIES/mL ENTEROBACTER AEROGENES (A)  Final   Report Status 10/28/2020 FINAL  Final   Organism ID, Bacteria ENTEROBACTER AEROGENES (A)  Final      Susceptibility   Enterobacter aerogenes - MIC*    CEFAZOLIN >=64 RESISTANT Resistant     CEFEPIME <=0.12 SENSITIVE Sensitive     CEFTRIAXONE <=0.25 SENSITIVE Sensitive     CIPROFLOXACIN <=0.25 SENSITIVE Sensitive     GENTAMICIN <=1 SENSITIVE Sensitive     IMIPENEM 1 SENSITIVE Sensitive     NITROFURANTOIN 64 INTERMEDIATE Intermediate     TRIMETH/SULFA <=20 SENSITIVE Sensitive     PIP/TAZO <=4 SENSITIVE Sensitive     * 40,000 COLONIES/mL ENTEROBACTER AEROGENES  MRSA PCR Screening     Status: None   Collection Time: 10/26/20  6:42 PM   Specimen: Nasal Mucosa; Nasopharyngeal  Result Value Ref Range Status   MRSA by PCR NEGATIVE NEGATIVE Final    Comment:        The GeneXpert MRSA Assay (FDA approved for NASAL specimens only), is one component of a comprehensive MRSA colonization surveillance program. It is not intended to diagnose MRSA infection nor to guide or monitor treatment for MRSA infections. Performed at [facility], [address], [place], [place] [address]     Studies/Results: No results found.   Assessment/Plan:  INTERVAL HISTORY: No significant events or studies.  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 ID was consulted for management of his antibiotics.  Flagyl 1/17 >> Ceftriaxone 1/21 >> 1/23, Restarted 1/27>> Daptomycin 1/27 >>   Leukocytosis trending down more.  Continue PO Flagyl 500 mg q8h, IV daptomycin 670 mg daily and IV ceftriaxone 2 g q12h x4 weeks  Patient has poor long-term prognosis. AKA is the only definite treatment for his infection due poor circulation as mentioned previous.   Continue goals of care discussions with palliative care  We will place OPAT orders to continue 4 more weeks of IV antibiotics.  Patient can discontinue IV antibiotics if family decides to do surgery.  Patient is scheduled to follow-up with Dr. Tommy Medal on 12/05/20  ID will sign off.  Please call with further questions.    LOS: 14 days   Lacinda Axon 11/08/2020, 8:19 AM

## 2020-11-08 NOTE — Progress Notes (Signed)
PROGRESS NOTE    [name]  MOQ:[id number] DOB: July 30, 1951 DOA: 10/25/2020 PCP: Leeanne Rio, MD   Chief Complaint  Patient presents with  . Foot Pain  . Foot Ulcer  Brief Narrative: 70 year old male with hypertension HLD, T2DM, a flutter, CAD, history of a stroke with left hemiparesis admitted with left foot wound from recent trauma.  Patient Dr Marc Morgans for antibiotics at [facility] vascular surgery was consulted and transferred to [facility] 1/17-but patient refused amputation. Patient had complicated hospitalization with acute hypoxic respiratory failure acute on chronic systolic CHF.  Seen by cardiology and vascular.  Palliative care was consulted since he initially refused amputation. After family meeting with palliative care 11/04/20 patient and wife wanted to proceed with amputation-vascular surgery and cardiology were notified. 1/26: Family again decided against surgery ID was consulted-antibiotics were adjusted.  Subjective:  Seen this am, resting C/o pain left foot Afebrile overnight.  Blood pressure stable, WBC 14.7k No new complaints  Assessment & Plan:  Severe sepsis POA due to left foot osteomyelitis/left foot osteomyelitis: Initially refused amputation, but family wanted to proceed with amputation and vascular surgery Dr. Hardin Negus was reconsulted along with preop eval by cardiology was obtained however wife now refusing surgery again 1/26, ID  Consulted and Abx adjusted to Flagyl x2 weeks and IV daptomycin 8 mg/kg daily and IV ceftriaxone 2 g every 24 hours x2-week.  Of note antibiotic alert clear to osteomyelitis amputation remains a definitive treatment patient has risk of readmission with sepsis and related complications in the future.  Awaiting for palliative discussion with the family today for further plan. 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 Continue oral Lasix Toprol.   Acute hypoxic respiratory failure resolved  PAF/a flutter with RVR : Continue on Toprol Lovenox and amiodarone. Patient will need long-term DOAC given chads 2 vasc score of 8 as per cardio.   Mildly positive troponin subtle elevation with flat trend EKG nonischemic per cardiology no further clinical work-up recommended.  Insulin-dependent type 2 diabetes mellitus, HbA1c 6.9.  Blood sugar remains poorly controlled we will further increase Lantus to 10 units, continue sliding scale insulin Recent Labs  Lab 11/06/20 2123 11/07/20 0744 11/07/20 1143 11/07/20 1550 11/07/20 2112  GLUCAP 253* 168* 215* 217* 229*   Hypokalemia/magnesium labs remain stable continue daily potassium supplement  CAD multivessel with history of CABG  12/2013/ hyperlipidemia: Continue his Lipitor, Plavix, metoprolol.  History of CVA with left hemiparesis/PAD/HTN: Remains on statin/Plavix.    CKD stage II, renal function is stable.Monitor Recent Labs  Lab 11/04/20 0449 11/05/20 0510 11/06/20 0500 11/07/20 0500 11/08/20 0500  BUN 11 12 10 10 12   CREATININE 0.96 0.94 0.86 0.93 1.02   Dementia:on Aricept/Namenda.  Continue supportive measures, fall precautions.  GERD- cont ppi BPH-continuous finasteride  Goals of care, counseling/discussion: Palliative care has been following very closely appreciate input.  After extensive family discussion they would like to proceed with amputation.  Patient is DNR. Again family deciding against surgery, awaiting for further palliative discussion with the family.  Patient remains high risk for readmissions with sepsis given that they have refused amputation which is the definitive treatment.   Morbid obesity with body mass index is 38.09 kg/m.  PCP follow-up weight loss  Nutrition: Diet Order  DIET DYS 2 Room service appropriate? Yes;  Fluid consistency: Thin  Diet effective now                DVT prophylaxis: SCDs Start: 10/25/20 2246 Code Status:   Code Status: DNR  Family Communication: plan of care discussed with patient at bedside.  Status is: Inpatient Remains inpatient appropriate because:Unsafe d/c plan and Inpatient level of care appropriate due to severity of illness  Dispo: The patient is from: Home              Anticipated d/c is to: TBD-awaiting for palliative care discussion with the family-patient and family does not want to proceed with amputation and now patient is placed on IV antibiotics which will need x2 weeks.                Anticipated d/c date is:2 days              Patient currently is not medically stable to d/c.   Difficult to place patient Yes  Consultants:see note  Procedures:see note  Culture/Microbiology    Component Value Date/Time   SDES  10/25/2020 2210    IN/OUT CATH URINE Performed at [facility], [address], [place], [place] [address]    [facility]  10/25/2020 2210    NONE Performed at [facility], [address], [place], [place] [address]    CULT 40,000 COLONIES/mL ENTEROBACTER AEROGENES (A) 10/25/2020 2210   REPTSTATUS 10/28/2020 FINAL 10/25/2020 2210    Other culture-see note  Medications: Scheduled Meds: . amiodarone  200 mg Oral Daily  . atorvastatin  40 mg Oral Daily  . baclofen  5 mg Oral TID  . Chlorhexidine Gluconate Cloth  6 each Topical Daily  . cholecalciferol  5,000 Units Oral Q1200  . clopidogrel  75 mg Oral Daily  . donepezil  5 mg Oral QHS  . enoxaparin (LOVENOX) injection  110 mg Subcutaneous BID  . feeding supplement (GLUCERNA SHAKE)  237 mL Oral TID BM  . ferrous sulfate  325 mg Oral BID WC  . finasteride  5 mg Oral Q1200  . furosemide  40 mg Oral Daily  . gabapentin  600 mg Oral TID  . Gerhardt's butt cream   Topical QID  . insulin aspart  0-15 Units Subcutaneous TID WC  . insulin aspart  0-5 Units Subcutaneous QHS  . insulin  glargine  8 Units Subcutaneous QHS  . loratadine  5 mg Oral QHS  . memantine  5 mg Oral BID  . metoprolol succinate  25 mg Oral Daily  . metroNIDAZOLE  500 mg Oral Q8H  . pantoprazole  40 mg Oral Daily  . potassium chloride  20 mEq Oral Daily  . sodium chloride flush  10-40 mL Intracatheter Q12H   Continuous Infusions: . cefTRIAXone (ROCEPHIN)  IV Stopped (11/07/20 1058)  . DAPTOmycin (CUBICIN)  IV Stopped (11/07/20 1253)    Antimicrobials: Anti-infectives (From admission, onward)   Start     Dose/Rate Route Frequency Ordered Stop   11/07/20 1000  cefTRIAXone (ROCEPHIN) 2 g in sodium chloride 0.9 % 100 mL IVPB        2 g 200 mL/hr over 30 Minutes Intravenous Every 24 hours 11/07/20 0742     11/07/20 1000  DAPTOmycin (CUBICIN) 670 mg in sodium chloride 0.9 % IVPB        670 mg 226.8 mL/hr over 30 Minutes Intravenous Daily 11/07/20 0746     11/03/20 2200  metroNIDAZOLE (FLAGYL) tablet 500 mg  500 mg Oral Every 8 hours 11/03/20 1554     11/03/20 2000  sulfamethoxazole-trimethoprim (BACTRIM DS) 800-160 MG per tablet 1 tablet  Status:  Discontinued        1 tablet Oral Every 12 hours 11/03/20 1554 11/07/20 0742   11/01/20 0915  cefTRIAXone (ROCEPHIN) 2 g in sodium chloride 0.9 % 100 mL IVPB  Status:  Discontinued        2 g 200 mL/hr over 30 Minutes Intravenous Every 24 hours 11/01/20 0816 11/03/20 1551   10/29/20 1400  vancomycin (VANCOREADY) IVPB 750 mg/150 mL  Status:  Discontinued        750 mg 150 mL/hr over 60 Minutes Intravenous Every 12 hours 10/29/20 1238 11/01/20 0816   10/28/20 1400  vancomycin (VANCOCIN) IVPB 1000 mg/200 mL premix  Status:  Discontinued        1,000 mg 200 mL/hr over 60 Minutes Intravenous Every 12 hours 10/28/20 1344 10/29/20 1238   10/28/20 0900  metroNIDAZOLE (FLAGYL) IVPB 500 mg  Status:  Discontinued        500 mg 100 mL/hr over 60 Minutes Intravenous Every 8 hours 10/28/20 0808 11/03/20 1551   10/26/20 1000  vancomycin (VANCOCIN) IVPB 1000  mg/200 mL premix  Status:  Discontinued        1,000 mg 200 mL/hr over 60 Minutes Intravenous Every 12 hours 10/25/20 2037 10/28/20 1344   10/25/20 2200  ceFEPIme (MAXIPIME) 2 g in sodium chloride 0.9 % 100 mL IVPB  Status:  Discontinued        2 g 200 mL/hr over 30 Minutes Intravenous Every 8 hours 10/25/20 2037 11/01/20 0816   10/25/20 2030  vancomycin (VANCOREADY) IVPB 2000 mg/400 mL        2,000 mg 200 mL/hr over 120 Minutes Intravenous NOW 10/25/20 2027 10/25/20 2248   10/25/20 1930  vancomycin (VANCOCIN) IVPB 1000 mg/200 mL premix  Status:  Discontinued        1,000 mg 200 mL/hr over 60 Minutes Intravenous  Once 10/25/20 1928 10/25/20 2136   10/25/20 1930  cefTRIAXone (ROCEPHIN) 2 g in sodium chloride 0.9 % 100 mL IVPB        2 g 200 mL/hr over 30 Minutes Intravenous  Once 10/25/20 1928 10/25/20 2040     Objective: Vitals: Today's Vitals   11/08/20 0256 11/08/20 0333 11/08/20 0357 11/08/20 0624  BP:  122/72    Pulse:  (!) 110 100   Resp:  12 16   Temp:  98.4 F (36.9 C)    TempSrc:  Axillary    SpO2:  95% 98%   Weight:      Height:      PainSc: 10-Worst pain ever  Asleep Asleep    Intake/Output Summary (Last 24 hours) at 11/08/2020 0736 Last data filed at 11/07/2020 1405 Gross per 24 hour  Intake 800 ml  Output --  Net 800 ml   Filed Weights   10/25/20 1824 10/26/20 1850 10/27/20 0500  Weight: 116.1 kg 110.5 kg 110.3 kg   Weight change:   Intake/Output from previous day: 01/27 0701 - 01/28 0700 In: 800 [P.O.:600; IV Piggyback:200] Out: -  Intake/Output this shift: No intake/output data recorded. Filed Weights   10/25/20 1824 10/26/20 1850 10/27/20 0500  Weight: 116.1 kg 110.5 kg 110.3 kg    Examination: General exam: AAO to self,, thinks it is hospital ( [place]), NAD, obese HEENT:Oral mucosa moist, Ear/Nose WNL grossly, dentition normal. Respiratory system: bilaterally diminished,no wheezing or crackles,no use of  accessory muscle Cardiovascular  system: S1 & S2 +, No JVD,. Gastrointestinal system: Abdomen soft, NT,ND, BS+ Nervous System:Alert, awake, moving extremities and grossly nonfocal Extremities: No edema, feet in dressing and boot in place, did not remove Skin: No rashes,no icterus. MSK: Normal muscle bulk,tone, power  Data Reviewed: I have personally reviewed following labs and imaging studies CBC: Recent Labs  Lab 11/04/20 1031 11/05/20 0510 11/06/20 0500 11/07/20 0500 11/08/20 0500  WBC 16.7* 18.1* 17.4* 15.8* 14.7*  HGB 11.4* 11.1* 11.1* 11.4* 10.6*  HCT 34.2* 34.7* 33.8* 35.6* 33.9*  MCV 94.0 95.3 94.7 96.0 98.0  PLT 204 193 221 263 [id number]   Basic Metabolic Panel: Recent Labs  Lab 11/02/20 0435 11/03/20 0605 11/04/20 0449 11/05/20 0510 11/06/20 0500 11/07/20 0500 11/08/20 0500  NA 147*   < > 140 138 139 141 140  K 3.1*   < > 3.8 3.6 3.4* 4.0 3.8  CL 111   < > 109 105 107 107 107  CO2 25   < > 25 24 24 25 23   GLUCOSE 196*   < > 181* 130* 143* 141* 147*  BUN 18   < > 11 12 10 10 12   CREATININE 0.98   < > 0.96 0.94 0.86 0.93 1.02  CALCIUM 7.6*   < > 7.7* 7.7* 7.6* 7.9* 7.8*  MG 2.1  --  1.9  --   --   --   --    < > = values in this interval not displayed.   GFR: Estimated Creatinine Clearance: 82.2 mL/min (by C-G formula based on SCr of 1.02 mg/dL). Liver Function Tests: No results for input(s): AST, ALT, ALKPHOS, BILITOT, PROT, ALBUMIN in the last 168 hours. No results for input(s): LIPASE, AMYLASE in the last 168 hours. No results for input(s): AMMONIA in the last 168 hours. Coagulation Profile: No results for input(s): INR, PROTIME in the last 168 hours. Cardiac Enzymes: Recent Labs  Lab 11/07/20 0500  CKTOTAL 157   BNP (last 3 results) No results for input(s): PROBNP in the last 8760 hours. HbA1C: No results for input(s): HGBA1C in the last 72 hours. CBG: Recent Labs  Lab 11/06/20 2123 11/07/20 0744 11/07/20 1143 11/07/20 1550 11/07/20 2112  GLUCAP 253* 168* 215* 217* 229*    Lipid Profile: No results for input(s): CHOL, HDL, LDLCALC, TRIG, CHOLHDL, LDLDIRECT in the last 72 hours. Thyroid Function Tests: No results for input(s): TSH, T4TOTAL, FREET4, T3FREE, THYROIDAB in the last 72 hours. Anemia Panel: No results for input(s): VITAMINB12, FOLATE, FERRITIN, TIBC, IRON, RETICCTPCT in the last 72 hours. Sepsis Labs: No results for input(s): PROCALCITON, LATICACIDVEN in the last 168 hours.  No results found for this or any previous visit (from the past 240 hour(s)).   Radiology Studies: No results found.   LOS: 14 days   Antonieta Pert, MD [facility]  11/08/2020, 7:36 AM

## 2020-11-08 NOTE — Progress Notes (Addendum)
When entering room for last round of dayshift, Pt's wife stated Pt asked her "when are they going to go ahead and cut this leg off?" Pt's wife explained to RN that she wants what the Pt's wants. RN asked if both were consenting to the previously discussed AKA procedure. Both stated yes.   Pt's wife stated to RN "if something happens on the table I want him resuscitated." RN explained his current DNR status and with the switching back and forth this would all need to be discussed with the MD.
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
## 2020-11-09 DIAGNOSIS — Z789 Other specified health status: Secondary | ICD-10-CM | POA: Diagnosis not present

## 2020-11-09 DIAGNOSIS — Z515 Encounter for palliative care: Secondary | ICD-10-CM | POA: Diagnosis not present

## 2020-11-09 DIAGNOSIS — A419 Sepsis, unspecified organism: Secondary | ICD-10-CM | POA: Diagnosis not present

## 2020-11-09 DIAGNOSIS — Z7189 Other specified counseling: Secondary | ICD-10-CM | POA: Diagnosis not present

## 2020-11-09 LAB — GLUCOSE, CAPILLARY
Glucose-Capillary: 139 mg/dL — ABNORMAL HIGH (ref 70–99)
Glucose-Capillary: 204 mg/dL — ABNORMAL HIGH (ref 70–99)
Glucose-Capillary: 270 mg/dL — ABNORMAL HIGH (ref 70–99)
Glucose-Capillary: 218 mg/dL — ABNORMAL HIGH (ref 70–99)

## 2020-11-09 LAB — CBC
HCT: 32.3 % — ABNORMAL LOW (ref 39.0–52.0)
Hemoglobin: 10.4 g/dL — ABNORMAL LOW (ref 13.0–17.0)
MCH: 31.1 pg (ref 26.0–34.0)
MCHC: 32.2 g/dL (ref 30.0–36.0)
MCV: 96.7 fL (ref 80.0–100.0)
Platelets: 273 10*3/uL (ref 150–400)
RBC: 3.34 MIL/uL — ABNORMAL LOW (ref 4.22–5.81)
RDW: 18.6 % — ABNORMAL HIGH (ref 11.5–15.5)
WBC: 13.7 10*3/uL — ABNORMAL HIGH (ref 4.0–10.5)
nRBC: 0 % (ref 0.0–0.2)

## 2020-11-09 LAB — BASIC METABOLIC PANEL
Anion gap: 8 (ref 5–15)
BUN: 12 mg/dL (ref 8–23)
CO2: 24 mmol/L (ref 22–32)
Calcium: 7.8 mg/dL — ABNORMAL LOW (ref 8.9–10.3)
Chloride: 106 mmol/L (ref 98–111)
Creatinine, Ser: 0.92 mg/dL (ref 0.61–1.24)
GFR, Estimated: >60 mL/min (ref >60)
Glucose, Bld: 142 mg/dL — ABNORMAL HIGH (ref 70–99)
Potassium: 4.2 mmol/L (ref 3.5–5.1)
Sodium: 138 mmol/L (ref 135–145)
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
## 2020-11-09 NOTE — Progress Notes (Signed)
Informed by primary team patient and wife are now amenable to L AKA. Will tentatively plan for OR Monday.  Will discuss with wife tomorrow to obtain consent.  [name]. Stanford Breed, MD [facility] Phone Number: [phone] 11/09/2020 11:16 AM

## 2020-11-09 NOTE — Consult Note (Signed)
WOC Nurse Consult Note: Reason for Consult: Bleeding noted in perineal area Wound type:MASD, specifically contact dermatitis from fecal incontinence. Pressure Injury POA: N/A  Discussed with Bedside RN, Joelene Millin who had patient last weekend and reports that skin in the perineal areas is vastly improved over last week.  She notes that application of moisture barrier products is essential to the protection of the care of this area and that on two occasions today her has cleansed and reapplied the moisture barrier product (Gerhart's Butt Cream) with 100% effectiveness.   I will communicate to Dr. Antonieta Pert that no photograph is required as he offered to take one tomorrow for me when we discussed via [place].   Turning and repositioning is in place as is a mattress replacement. Bedside RN to reinforce need for vigilance with moisture barrier cream application.  [facility] nursing team will not follow, but will remain available to this patient, the nursing and medical teams.  Please re-consult if needed. Thanks, Maudie Flakes, MSN, RN, [place], Arther Abbott  Pager# [phone]

## 2020-11-09 NOTE — Progress Notes (Signed)
PROGRESS NOTE    [name]  KZS:[id number] DOB: 02-23-1951 DOA: 10/25/2020 PCP: Leeanne Rio, MD   Chief Complaint  Patient presents with  . Foot Pain  . Foot Ulcer  Brief Narrative: 70 year old male with hypertension HLD, T2DM, a flutter, CAD, history of a stroke with left hemiparesis admitted with left foot wound from recent trauma.  Patient Dr Marc Morgans for antibiotics at [facility] vascular surgery was consulted and transferred to [facility] 1/17-but patient refused amputation. Patient had complicated hospitalization with acute hypoxic respiratory failure acute on chronic systolic CHF.  Seen by cardiology and vascular.  Palliative care was consulted since he initially refused amputation. After family meeting with palliative care 11/04/20 patient and wife wanted to proceed with amputation-vascular surgery and cardiology were notified. 1/26: Family again decided against surgery ID was consulted-antibiotics were adjusted. 1/29-family reverted to full code and wants to proceed with surgery on the understanding he is at high risk, vascular surgery has been notified.  Subjective:  Afebrile overnight, WBC downtrending 13.7 He is alert awake oriented x2, he is on room air No new complaints Assessment & Plan:  Severe sepsis POA due to left foot osteomyelitis/left foot osteomyelitis: Initially refused amputation, but family wanted to proceed with amputation and vascular surgery Dr. Oneida Alar was reconsulted along with preop eval by cardiology however wife then again refused surgery on 1/26, seen by ID  Abx adjusted to Flagyl x2 weeks and IV daptomycin 8 mg/kg daily and IV ceftriaxone 2 g every 24 hours x2-week.  May need long-term suppressive antibiotics despite which he may still continue to have infection/sepsis and other complications.PMT following very closely and after family discussion patient's son and wife wants to proceed with amputation and requested full code.  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  Remains euvolemic continue Lasix and Toprol.  Seen by cardiology for preop evaluation and is at high risk but not prohibitive given his underlying CAD/CHF, not, good candidate for invasive work-up at this time and cardiology has signed off. Net IO Since Admission: -1,867.34 mL [11/09/20 0741]  Acute hypoxic respiratory failure doing well on room air  PAF/a flutter with RVR : Rate controlled on Toprol , on therapeutic Lovenox and on amiodarone. Patient will need long-term DOAC given chads 2 vasc score of 8 as per cardio.   Mildly positive troponin subtle elevation with flat trend EKG nonischemic per cardiology no further clinical work-up recommended.  Insulin-dependent type 2 diabetes mellitus, HbA1c 6.9.  Lantus increased to 10 units, on sliding scale insulin.  Monitor CBG which remains borderline controlled.  Recent Labs  Lab 11/07/20 2112 11/08/20 0836 11/08/20 1107 11/08/20 1612 11/08/20 2043  GLUCAP 229* 145* 167* 226* 223*   Hypokalemia/magnesium labs stable. Continue daily potassium supplement  CAD multivessel with history of CABG  12/2013/ hyperlipidemia: No chest pain.  Continue his Lipitor, Plavix, metoprolol.   History of CVA with left hemiparesis/PAD/HTN: Remains on statin/Plavix.    CKD stage II, renal function is stable.Monitor Recent Labs  Lab 11/04/20 0449 11/05/20 0510 11/06/20 0500 11/07/20 0500 11/08/20 0500  BUN 11 12 10 10 12   CREATININE 0.96 0.94 0.86 0.93 1.02   Dementia:on Aricept/Namenda.  Alert awake but oriented x2, continue supportive measures/fall precaution.  GERD- cont  ppi BPH-continuous finasteride  Goals of care, counseling/discussion: Palliative care has been following very  closely.After extensive family initially wanted to proceed with surgery but decided against it, still fluctuating and indecisive.Patient remains high risk for readmissions with sepsis given that they have refused amputation which is the definitive treatment.  PMT following today family discussion  Morbid obesity with body mass index is 38.09 kg/m.  PCP follow-up weight loss  Nutrition: Diet Order            DIET DYS 2 Room service appropriate? Yes; Fluid consistency: Thin  Diet effective now                DVT prophylaxis: SCDs Start: 10/25/20 2246 Code Status:   Code Status: DNR  Family Communication: plan of care discussed with patient at bedside.  Status is: Inpatient Remains inpatient appropriate because:Unsafe d/c plan and Inpatient level of care appropriate due to severity of illness due to ongoing management of osteomyelitis/need for amputation.  Dispo: The patient is from: Home              Anticipated d/c is to: TBD              Anticipated d/c date is:3 days              Patient currently is not medically stable to d/c.   Difficult to place patient Yes  Consultants:see note  Procedures:see note  Culture/Microbiology    Component Value Date/Time   SDES  10/25/2020 2210    IN/OUT CATH URINE Performed at [facility], [address] [place], [place] [address]    [facility]  10/25/2020 2210    NONE Performed at [facility], [address], [place], [place] [address]    CULT 40,000 COLONIES/mL ENTEROBACTER AEROGENES (A) 10/25/2020 2210   REPTSTATUS 10/28/2020 FINAL 10/25/2020 2210    Other culture-see note  Medications: Scheduled Meds: . amiodarone  200 mg Oral Daily  . atorvastatin  40 mg Oral Daily  . baclofen  5 mg Oral TID  . Chlorhexidine Gluconate Cloth  6 each Topical Daily  . cholecalciferol  5,000 Units Oral Q1200  . clopidogrel  75 mg Oral Daily  . donepezil  5 mg Oral QHS  . enoxaparin (LOVENOX) injection  110 mg Subcutaneous BID  . feeding  supplement (GLUCERNA SHAKE)  237 mL Oral TID BM  . ferrous sulfate  325 mg Oral BID WC  . finasteride  5 mg Oral Q1200  . furosemide  40 mg Oral Daily  . gabapentin  600 mg Oral TID  . Gerhardt's butt cream   Topical QID  . insulin aspart  0-15 Units Subcutaneous TID WC  . insulin aspart  0-5 Units Subcutaneous QHS  . insulin glargine  10 Units Subcutaneous QHS  . loratadine  5 mg Oral QHS  . memantine  5 mg Oral BID  . metoprolol succinate  25 mg Oral Daily  . metroNIDAZOLE  500 mg Oral Q8H  . pantoprazole  40 mg Oral Daily  . potassium chloride  20 mEq Oral Daily  . sodium chloride flush  10-40 mL Intracatheter Q12H   Continuous Infusions: . cefTRIAXone (ROCEPHIN)  IV 2 g (11/08/20 0928)  . DAPTOmycin (CUBICIN)  IV 670 mg (11/08/20 2335)    Antimicrobials: Anti-infectives (From admission, onward)   Start     Dose/Rate Route Frequency Ordered Stop   11/07/20 1000  cefTRIAXone (ROCEPHIN) 2 g in sodium chloride 0.9 % 100 mL IVPB  2 g 200 mL/hr over 30 Minutes Intravenous Every 24 hours 11/07/20 0742     11/07/20 1000  DAPTOmycin (CUBICIN) 670 mg in sodium chloride 0.9 % IVPB        670 mg 226.8 mL/hr over 30 Minutes Intravenous Daily 11/07/20 0746     11/03/20 2200  metroNIDAZOLE (FLAGYL) tablet 500 mg        500 mg Oral Every 8 hours 11/03/20 1554     11/03/20 2000  sulfamethoxazole-trimethoprim (BACTRIM DS) 800-160 MG per tablet 1 tablet  Status:  Discontinued        1 tablet Oral Every 12 hours 11/03/20 1554 11/07/20 0742   11/01/20 0915  cefTRIAXone (ROCEPHIN) 2 g in sodium chloride 0.9 % 100 mL IVPB  Status:  Discontinued        2 g 200 mL/hr over 30 Minutes Intravenous Every 24 hours 11/01/20 0816 11/03/20 1551   10/29/20 1400  vancomycin (VANCOREADY) IVPB 750 mg/150 mL  Status:  Discontinued        750 mg 150 mL/hr over 60 Minutes Intravenous Every 12 hours 10/29/20 1238 11/01/20 0816   10/28/20 1400  vancomycin (VANCOCIN) IVPB 1000 mg/200 mL premix  Status:   Discontinued        1,000 mg 200 mL/hr over 60 Minutes Intravenous Every 12 hours 10/28/20 1344 10/29/20 1238   10/28/20 0900  metroNIDAZOLE (FLAGYL) IVPB 500 mg  Status:  Discontinued        500 mg 100 mL/hr over 60 Minutes Intravenous Every 8 hours 10/28/20 0808 11/03/20 1551   10/26/20 1000  vancomycin (VANCOCIN) IVPB 1000 mg/200 mL premix  Status:  Discontinued        1,000 mg 200 mL/hr over 60 Minutes Intravenous Every 12 hours 10/25/20 2037 10/28/20 1344   10/25/20 2200  ceFEPIme (MAXIPIME) 2 g in sodium chloride 0.9 % 100 mL IVPB  Status:  Discontinued        2 g 200 mL/hr over 30 Minutes Intravenous Every 8 hours 10/25/20 2037 11/01/20 0816   10/25/20 2030  vancomycin (VANCOREADY) IVPB 2000 mg/400 mL        2,000 mg 200 mL/hr over 120 Minutes Intravenous NOW 10/25/20 2027 10/25/20 2248   10/25/20 1930  vancomycin (VANCOCIN) IVPB 1000 mg/200 mL premix  Status:  Discontinued        1,000 mg 200 mL/hr over 60 Minutes Intravenous  Once 10/25/20 1928 10/25/20 2136   10/25/20 1930  cefTRIAXone (ROCEPHIN) 2 g in sodium chloride 0.9 % 100 mL IVPB        2 g 200 mL/hr over 30 Minutes Intravenous  Once 10/25/20 1928 10/25/20 2040     Objective: Vitals: Today's Vitals   11/08/20 2000 11/08/20 2045 11/08/20 2344 11/09/20 0400  BP: 114/61  (!) 98/51 (!) 99/55  Pulse: (!) 104  99 97  Resp: 16  15 15   Temp:  98.5 F (36.9 C) 98.1 F (36.7 C) 98 F (36.7 C)  TempSrc:  Oral Oral Oral  SpO2: 96%  95% 97%  Weight:      Height:      PainSc:        Intake/Output Summary (Last 24 hours) at 11/09/2020 0738 Last data filed at 11/09/2020 0400 Gross per 24 hour  Intake --  Output 2100 ml  Net -2100 ml   Filed Weights   10/25/20 1824 10/26/20 1850 10/27/20 0500  Weight: 116.1 kg 110.5 kg 110.3 kg   Weight change:   Intake/Output from previous day:  01/28 0701 - 01/29 0700 In: -  Out: 2100 [Urine:1100; Stool:1000] Intake/Output this shift: No intake/output data recorded. Filed  Weights   10/25/20 1824 10/26/20 1850 10/27/20 0500  Weight: 116.1 kg 110.5 kg 110.3 kg    Examination: General exam: AAOx2-he thinks he is at an [facility] still HEENT:Oral mucosa moist, Ear/Nose WNL grossly, dentition normal. Respiratory system: bilaterally diminished,no wheezing or crackles,no use of accessory muscle Cardiovascular system: S1 & S2 +, No JVD,. Gastrointestinal system: Abdomen soft, NT,ND, BS+ Nervous System:Alert, awake, moving extremities and grossly nonfocal Extremities: Foot with dressing in place, Skin: No rashes,no icterus. MSK: Normal muscle bulk,tone, power  Data Reviewed: I have personally reviewed following labs and imaging studies CBC: Recent Labs  Lab 11/05/20 0510 11/06/20 0500 11/07/20 0500 11/08/20 0500 11/09/20 0550  WBC 18.1* 17.4* 15.8* 14.7* 13.7*  HGB 11.1* 11.1* 11.4* 10.6* 10.4*  HCT 34.7* 33.8* 35.6* 33.9* 32.3*  MCV 95.3 94.7 96.0 98.0 96.7  PLT 193 221 263 269 [id number]   Basic Metabolic Panel: Recent Labs  Lab 11/04/20 0449 11/05/20 0510 11/06/20 0500 11/07/20 0500 11/08/20 0500  NA 140 138 139 141 140  K 3.8 3.6 3.4* 4.0 3.8  CL 109 105 107 107 107  CO2 25 24 24 25 23   GLUCOSE 181* 130* 143* 141* 147*  BUN 11 12 10 10 12   CREATININE 0.96 0.94 0.86 0.93 1.02  CALCIUM 7.7* 7.7* 7.6* 7.9* 7.8*  MG 1.9  --   --   --   --    GFR: Estimated Creatinine Clearance: 82.2 mL/min (by C-G formula based on SCr of 1.02 mg/dL). Liver Function Tests: No results for input(s): AST, ALT, ALKPHOS, BILITOT, PROT, ALBUMIN in the last 168 hours. No results for input(s): LIPASE, AMYLASE in the last 168 hours. No results for input(s): AMMONIA in the last 168 hours. Coagulation Profile: No results for input(s): INR, PROTIME in the last 168 hours. Cardiac Enzymes: Recent Labs  Lab 11/07/20 0500  CKTOTAL 157   BNP (last 3 results) No results for input(s): PROBNP in the last 8760 hours. HbA1C: No results for input(s): HGBA1C in the  last 72 hours. CBG: Recent Labs  Lab 11/07/20 2112 11/08/20 0836 11/08/20 1107 11/08/20 1612 11/08/20 2043  GLUCAP 229* 145* 167* 226* 223*   Lipid Profile: No results for input(s): CHOL, HDL, LDLCALC, TRIG, CHOLHDL, LDLDIRECT in the last 72 hours. Thyroid Function Tests: No results for input(s): TSH, T4TOTAL, FREET4, T3FREE, THYROIDAB in the last 72 hours. Anemia Panel: No results for input(s): VITAMINB12, FOLATE, FERRITIN, TIBC, IRON, RETICCTPCT in the last 72 hours. Sepsis Labs: No results for input(s): PROCALCITON, LATICACIDVEN in the last 168 hours.  No results found for this or any previous visit (from the past 240 hour(s)).   Radiology Studies: No results found.   LOS: 15 days   Antonieta Pert, MD [facility]  11/09/2020, 7:38 AM

## 2020-11-10 DIAGNOSIS — A419 Sepsis, unspecified organism: Secondary | ICD-10-CM | POA: Diagnosis not present

## 2020-11-10 DIAGNOSIS — Z7189 Other specified counseling: Secondary | ICD-10-CM | POA: Diagnosis not present

## 2020-11-10 DIAGNOSIS — Z515 Encounter for palliative care: Secondary | ICD-10-CM | POA: Diagnosis not present

## 2020-11-10 LAB — CBC
HCT: 34.4 % — ABNORMAL LOW (ref 39.0–52.0)
Hemoglobin: 10.8 g/dL — ABNORMAL LOW (ref 13.0–17.0)
MCH: 30.5 pg (ref 26.0–34.0)
MCHC: 31.4 g/dL (ref 30.0–36.0)
MCV: 97.2 fL (ref 80.0–100.0)
Platelets: 277 10*3/uL (ref 150–400)
RBC: 3.54 MIL/uL — ABNORMAL LOW (ref 4.22–5.81)
RDW: 18.5 % — ABNORMAL HIGH (ref 11.5–15.5)
WBC: 13.5 10*3/uL — ABNORMAL HIGH (ref 4.0–10.5)
nRBC: 0 % (ref 0.0–0.2)

## 2020-11-10 LAB — BASIC METABOLIC PANEL
Anion gap: 7 (ref 5–15)
BUN: 10 mg/dL (ref 8–23)
CO2: 24 mmol/L (ref 22–32)
Calcium: 8.1 mg/dL — ABNORMAL LOW (ref 8.9–10.3)
Chloride: 106 mmol/L (ref 98–111)
Creatinine, Ser: 0.85 mg/dL (ref 0.61–1.24)
GFR, Estimated: >60 mL/min (ref >60)
Glucose, Bld: 144 mg/dL — ABNORMAL HIGH (ref 70–99)
Potassium: 4.1 mmol/L (ref 3.5–5.1)
Sodium: 137 mmol/L (ref 135–145)

## 2020-11-10 LAB — GLUCOSE, CAPILLARY
Glucose-Capillary: 155 mg/dL — ABNORMAL HIGH (ref 70–99)
Glucose-Capillary: 153 mg/dL — ABNORMAL HIGH (ref 70–99)
Glucose-Capillary: 277 mg/dL — ABNORMAL HIGH (ref 70–99)
Glucose-Capillary: 286 mg/dL — ABNORMAL HIGH (ref 70–99)

## 2020-11-10 LAB — SARS CORONAVIRUS 2 BY RT PCR (HOSPITAL ORDER, PERFORMED IN ~~LOC~~ HOSPITAL LAB): SARS Coronavirus 2: NEGATIVE

## 2020-11-10 NOTE — Progress Notes (Signed)
PROGRESS NOTE    [name]  [id number] DOB: [birth year] DOA: 10/25/2020 PCP: Leeanne Rio, MD   Chief Complaint  Patient presents with  . Foot Pain  . Foot Ulcer  Brief Narrative: 70 year old male with hypertension HLD, T2DM, a flutter, CAD, history of a stroke with left hemiparesis admitted with left foot wound from recent trauma.  Patient Dr Marc Morgans for antibiotics at [facility] vascular surgery was consulted and transferred to [facility] 1/17-but patient refused amputation. Patient had complicated hospitalization with acute hypoxic respiratory failure acute on chronic systolic CHF.  Seen by cardiology and vascular.  Palliative care was consulted since he initially refused amputation. After family meeting with palliative care 11/04/20 patient and wife wanted to proceed with amputation-vascular surgery and cardiology were notified. 1/26: Family again decided against surgery ID was consulted-antibiotics were adjusted. 1/29-family reverted to full code and wants to proceed with surgery on the understanding he is at high risk, vascular surgery has been notified.  Subjective: Resting comfortably alert awake oriented to self, date of birth and aware that he is going for surgery Patient is afebrile overnight.  WBC remains up at 13.5  CBG 155 this morning. He has no new complaints Assessment & Plan:  Severe sepsis POA due to left foot osteomyelitis/left foot osteomyelitis: Patient family back-and-forth with decision regarding amputation.  Wants to proceed with amputation and surgery has been notified. Seen by ID  Abx adjusted to Flagyl x2 weeks and IV daptomycin 8 mg/kg daily and IV ceftriaxone 2 g every 24 hours x2-week.PMT following very closely continue current antibiotics.  N.p.o. past midnight. 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  Volume status stable, continue Lasix and Toprol.  Seen by cardiology for preop evaluation and is at high risk but not prohibitive given his underlying CAD/CHF, not a good candidate for invasive work-up at this time and cardiology has signed off. Net IO Since Admission: -3,017.34 mL [11/10/20 0750]  CAD multivessel with history of CABG  12/2013/ hyperlipidemia: No chest pain.  Continue his Lipitor, Plavix, metoprolol.   Mildly positive troponin subtle elevation with flat trend EKG nonischemic per cardiology no further clinical work-up recommended.  PAF/a flutter with RVR : Rate controlled, continue Toprol, amiodarone, hold therapeutic Lovenox preoperatively. Patient will need long-term DOAC given chads 2 vasc score of 8 as per cardio-once okayed by vascular post op.   Acute hypoxic respiratory failure resolved  Insulin-dependent type 2 diabetes mellitus, HbA1c 6.9.  Blood sugar now improving after increasing Lantus to 10 units, also on SSI.  Recent Labs  Lab 11/09/20 0834 11/09/20 1221 11/09/20 1544 11/09/20 2058 11/10/20 0729  GLUCAP 139* 204* 270* 218* 155*   Hypokalemia/magnesium resolved, continue  daily potassium supplement  History of CVA with left hemiparesis/PAD/HTN: cont statin/Plavix.    CKD stage II, renal function is stable.Monitor Recent Labs  Lab 11/06/20 0500 11/07/20 0500 11/08/20 0500 11/09/20 0858 11/10/20 0650  BUN 10 10 12 12 10   CREATININE 0.86 0.93 1.02 0.92 0.85   Dementia:on Aricept/Namenda.  He is alert awake oriented x2, continue supportive measures and fall precautions.   GERD- cont ppi  BPH-continuous finasteride  [id number] care has been following very closely.After extensive family initially wanted to proceed with surgery but decided against it, still fluctuating and indecisive.Patient remains high  risk for readmissions with sepsis given that they have refused amputation which is  the definitive treatment.  After PMT meeting 1/29 family wants to proceed with amputation.  Morbid obesity with body mass index is 38.09 kg/m.PCP follow-up and outpatient weight loss advised.  Bleeding in the perineal area wound type MASD, contact dermatitis from fecal incontinence, wound care following continue management  Nutrition: Diet Order            DIET DYS 2 Room service appropriate? Yes; Fluid consistency: Thin  Diet effective now                DVT prophylaxis: SCDs Start: 10/25/20 2246 Code Status:   Code Status: Full Code  Family Communication: plan of care discussed with patient at bedside.  Status is: Inpatient Remains inpatient appropriate because:Unsafe d/c plan and Inpatient level of care appropriate due to severity of illness due to ongoing management of osteomyelitis/need for amputation.  Dispo: The patient is from:Home              Anticipated d/c is to:TBD              Anticipated d/c date is:3 days              Patient currently is not medically stable to d/c.   Difficult to place patient Yes  Consultants:see note  Procedures:see note  Culture/Microbiology    Component Value Date/Time   SDES  10/25/2020 2210    IN/OUT CATH URINE Performed at [facility], [address] [place], [place] [address]    [facility]  10/25/2020 2210    NONE Performed at [facility], [address] [place], [place] [address]    CULT 40,000 COLONIES/mL ENTEROBACTER AEROGENES (A) 10/25/2020 2210   REPTSTATUS 10/28/2020 FINAL 10/25/2020 2210    Other culture-see note  Medications: Scheduled Meds: . amiodarone  200 mg Oral Daily  . atorvastatin  40 mg Oral Daily  . baclofen  5 mg Oral TID  . Chlorhexidine Gluconate Cloth  6 each Topical Daily  . cholecalciferol  5,000 Units Oral Q1200  . clopidogrel  75 mg Oral Daily  . donepezil  5 mg Oral QHS  . enoxaparin (LOVENOX) injection  110 mg Subcutaneous BID  . feeding supplement (GLUCERNA SHAKE)  237 mL Oral TID BM   . ferrous sulfate  325 mg Oral BID WC  . finasteride  5 mg Oral Q1200  . furosemide  40 mg Oral Daily  . gabapentin  600 mg Oral TID  . Gerhardt's butt cream   Topical QID  . insulin aspart  0-15 Units Subcutaneous TID WC  . insulin aspart  0-5 Units Subcutaneous QHS  . insulin glargine  10 Units Subcutaneous QHS  . loratadine  5 mg Oral QHS  . memantine  5 mg Oral BID  . metoprolol succinate  25 mg Oral Daily  . metroNIDAZOLE  500 mg Oral Q8H  . pantoprazole  40 mg Oral Daily  . potassium chloride  20 mEq Oral Daily  . sodium chloride flush  10-40 mL Intracatheter Q12H   Continuous Infusions: . cefTRIAXone (ROCEPHIN)  IV 2 g (11/09/20 1005)  . DAPTOmycin (CUBICIN)  IV 670 mg (11/09/20 2106)    Antimicrobials: Anti-infectives (From admission, onward)   Start     Dose/Rate Route Frequency Ordered Stop   11/07/20 1000  cefTRIAXone (ROCEPHIN) 2 g in sodium chloride 0.9 % 100 mL IVPB        2 g 200 mL/hr over  30 Minutes Intravenous Every 24 hours 11/07/20 0742     11/07/20 1000  DAPTOmycin (CUBICIN) 670 mg in sodium chloride 0.9 % IVPB        670 mg 226.8 mL/hr over 30 Minutes Intravenous Daily 11/07/20 0746     11/03/20 2200  metroNIDAZOLE (FLAGYL) tablet 500 mg        500 mg Oral Every 8 hours 11/03/20 1554     11/03/20 2000  sulfamethoxazole-trimethoprim (BACTRIM DS) 800-160 MG per tablet 1 tablet  Status:  Discontinued        1 tablet Oral Every 12 hours 11/03/20 1554 11/07/20 0742   11/01/20 0915  cefTRIAXone (ROCEPHIN) 2 g in sodium chloride 0.9 % 100 mL IVPB  Status:  Discontinued        2 g 200 mL/hr over 30 Minutes Intravenous Every 24 hours 11/01/20 0816 11/03/20 1551   10/29/20 1400  vancomycin (VANCOREADY) IVPB 750 mg/150 mL  Status:  Discontinued        750 mg 150 mL/hr over 60 Minutes Intravenous Every 12 hours 10/29/20 1238 11/01/20 0816   10/28/20 1400  vancomycin (VANCOCIN) IVPB 1000 mg/200 mL premix  Status:  Discontinued        1,000 mg 200 mL/hr over 60  Minutes Intravenous Every 12 hours 10/28/20 1344 10/29/20 1238   10/28/20 0900  metroNIDAZOLE (FLAGYL) IVPB 500 mg  Status:  Discontinued        500 mg 100 mL/hr over 60 Minutes Intravenous Every 8 hours 10/28/20 0808 11/03/20 1551   10/26/20 1000  vancomycin (VANCOCIN) IVPB 1000 mg/200 mL premix  Status:  Discontinued        1,000 mg 200 mL/hr over 60 Minutes Intravenous Every 12 hours 10/25/20 2037 10/28/20 1344   10/25/20 2200  ceFEPIme (MAXIPIME) 2 g in sodium chloride 0.9 % 100 mL IVPB  Status:  Discontinued        2 g 200 mL/hr over 30 Minutes Intravenous Every 8 hours 10/25/20 2037 11/01/20 0816   10/25/20 2030  vancomycin (VANCOREADY) IVPB 2000 mg/400 mL        2,000 mg 200 mL/hr over 120 Minutes Intravenous NOW 10/25/20 2027 10/25/20 2248   10/25/20 1930  vancomycin (VANCOCIN) IVPB 1000 mg/200 mL premix  Status:  Discontinued        1,000 mg 200 mL/hr over 60 Minutes Intravenous  Once 10/25/20 1928 10/25/20 2136   10/25/20 1930  cefTRIAXone (ROCEPHIN) 2 g in sodium chloride 0.9 % 100 mL IVPB        2 g 200 mL/hr over 30 Minutes Intravenous  Once 10/25/20 1928 10/25/20 2040     Objective: Vitals: Today's Vitals   11/10/20 0000 11/10/20 0124 11/10/20 0714 11/10/20 0719  BP: (!) 118/107 138/62  123/63  Pulse: (!) 110   (!) 101  Resp: 17   17  Temp: 98.4 F (36.9 C)   98.4 F (36.9 C)  TempSrc: Oral   Oral  SpO2: 98%   97%  Weight:      Height:      PainSc:   Asleep     Intake/Output Summary (Last 24 hours) at 11/10/2020 0750 Last data filed at 11/09/2020 2000 Gross per 24 hour  Intake --  Output 1150 ml  Net -1150 ml   Filed Weights   10/25/20 1824 10/26/20 1850 10/27/20 0500  Weight: 116.1 kg 110.5 kg 110.3 kg   Weight change:   Intake/Output from previous day: 01/29 0701 - 01/30 0700 In: -  Out: 1150 [Urine:1150] Intake/Output this shift: No intake/output data recorded. Filed Weights   10/25/20 1824 10/26/20 1850 10/27/20 0500  Weight: 116.1 kg 110.5 kg  110.3 kg    Examination: General exam: AAOX3, old for his age, obese, ill looking. HEENT:Oral mucosa moist, Ear/Nose WNL grossly, dentition normal. Respiratory system: bilaterally diminished,no wheezing or crackles,no use of accessory muscle Cardiovascular system: S1 & S2 +, No JVD,. Gastrointestinal system: Abdomen soft, NT,ND, BS+ Nervous System:Alert, awake, with baseline dementia, moving extremities and grossly nonfocal Extremities: No edema, left foot in dressing, boot on both legs  Skin: No rashes,no icterus. MSK: Normal muscle bulk,tone, power    Data Reviewed: I have personally reviewed following labs and imaging studies CBC: Recent Labs  Lab 11/06/20 0500 11/07/20 0500 11/08/20 0500 11/09/20 0550 11/10/20 0650  WBC 17.4* 15.8* 14.7* 13.7* 13.5*  HGB 11.1* 11.4* 10.6* 10.4* 10.8*  HCT 33.8* 35.6* 33.9* 32.3* 34.4*  MCV 94.7 96.0 98.0 96.7 97.2  PLT 221 263 269 273 400   Basic Metabolic Panel: Recent Labs  Lab 11/04/20 0449 11/05/20 0510 11/06/20 0500 11/07/20 0500 11/08/20 0500 11/09/20 0858 11/10/20 0650  NA 140   < > 139 141 140 138 137  K 3.8   < > 3.4* 4.0 3.8 4.2 4.1  CL 109   < > 107 107 107 106 106  CO2 25   < > 24 25 23 24 24   GLUCOSE 181*   < > 143* 141* 147* 142* 144*  BUN 11   < > 10 10 12 12 10   CREATININE 0.96   < > 0.86 0.93 1.02 0.92 0.85  CALCIUM 7.7*   < > 7.6* 7.9* 7.8* 7.8* 8.1*  MG 1.9  --   --   --   --   --   --    < > = values in this interval not displayed.   GFR: Estimated Creatinine Clearance: 98.6 mL/min (by C-G formula based on SCr of 0.85 mg/dL). Liver Function Tests: No results for input(s): AST, ALT, ALKPHOS, BILITOT, PROT, ALBUMIN in the last 168 hours. No results for input(s): LIPASE, AMYLASE in the last 168 hours. No results for input(s): AMMONIA in the last 168 hours. Coagulation Profile: No results for input(s): INR, PROTIME in the last 168 hours. Cardiac Enzymes: Recent Labs  Lab 11/07/20 0500  CKTOTAL 157    BNP (last 3 results) No results for input(s): PROBNP in the last 8760 hours. HbA1C: No results for input(s): HGBA1C in the last 72 hours. CBG: Recent Labs  Lab 11/09/20 0834 11/09/20 1221 11/09/20 1544 11/09/20 2058 11/10/20 0729  GLUCAP 139* 204* 270* 218* 155*   Lipid Profile: No results for input(s): CHOL, HDL, LDLCALC, TRIG, CHOLHDL, LDLDIRECT in the last 72 hours. Thyroid Function Tests: No results for input(s): TSH, T4TOTAL, FREET4, T3FREE, THYROIDAB in the last 72 hours. Anemia Panel: No results for input(s): VITAMINB12, FOLATE, FERRITIN, TIBC, IRON, RETICCTPCT in the last 72 hours. Sepsis Labs: No results for input(s): PROCALCITON, LATICACIDVEN in the last 168 hours.  No results found for this or any previous visit (from the past 240 hour(s)).   Radiology Studies: No results found.   LOS: 16 days   Antonieta Pert, MD [facility]  11/10/2020, 7:50 AM

## 2020-11-10 NOTE — Progress Notes (Signed)
   ASSESSMENT & PLAN:  [name] is a 70 y.o. male non-salvageable LLE ischemic ulceration / gangrene. Wife and patient are amenable to LEFT above knee amputation. Will do this tomorrow. NPO after midnight.  Update COVID screen.  SUBJECTIVE:  No complaints. Ready for AKA.  OBJECTIVE:  BP 123/63   Pulse (!) 101   Temp 98.4 F (36.9 C) (Oral)   Resp 17   Ht 5\' 7"  (1.702 m)   Wt 110.3 kg   SpO2 97%   BMI 38.09 kg/m   Intake/Output Summary (Last 24 hours) at 11/10/2020 1204 Last data filed at 11/10/2020 0655 Gross per 24 hour  Intake --  Output 1700 ml  Net -1700 ml    Unchanged appearance of tissue loss  CBC Latest Ref Rng & Units 11/10/2020 11/09/2020 11/08/2020  WBC 4.0 - 10.5 K/uL 13.5(H) 13.7(H) 14.7(H)  Hemoglobin 13.0 - 17.0 g/dL 10.8(L) 10.4(L) 10.6(L)  Hematocrit 39.0 - 52.0 % 34.4(L) 32.3(L) 33.9(L)  Platelets 150 - 400 K/uL 277 273 269     CMP Latest Ref Rng & Units 11/10/2020 11/09/2020 11/08/2020  Glucose 70 - 99 mg/dL 144(H) 142(H) 147(H)  BUN 8 - 23 mg/dL 10 12 12   Creatinine 0.61 - 1.24 mg/dL 0.85 0.92 1.02  Sodium 135 - 145 mmol/L 137 138 140  Potassium 3.5 - 5.1 mmol/L 4.1 4.2 3.8  Chloride 98 - 111 mmol/L 106 106 107  CO2 22 - 32 mmol/L 24 24 23   Calcium 8.9 - 10.3 mg/dL 8.1(L) 7.8(L) 7.8(L)  Total Protein 6.5 - 8.1 g/dL - - -  Total Bilirubin 0.3 - 1.2 mg/dL - - -  Alkaline Phos 38 - 126 U/L - - -  AST 15 - 41 U/L - - -  ALT 0 - 44 U/L - - -    Estimated Creatinine Clearance: 98.6 mL/min (by C-G formula based on SCr of 0.85 mg/dL).  Yevonne Aline. Stanford Breed, MD [facility] Phone Number: [phone] 11/10/2020 12:04 PM
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
## 2020-11-11 ENCOUNTER — Encounter (HOSPITAL_COMMUNITY): Payer: Self-pay | Admitting: Internal Medicine

## 2020-11-11 ENCOUNTER — Encounter (HOSPITAL_COMMUNITY)
Admission: EM | Disposition: A | Discharge status: Skilled Nursing Facility | Payer: Self-pay | Source: Home / Self Care | Attending: Internal Medicine

## 2020-11-11 ENCOUNTER — Inpatient Hospital Stay (HOSPITAL_COMMUNITY): Payer: Medicare Other | Admitting: Anesthesiology

## 2020-11-11 DIAGNOSIS — Z515 Encounter for palliative care: Secondary | ICD-10-CM | POA: Diagnosis not present

## 2020-11-11 DIAGNOSIS — I70262 Atherosclerosis of native arteries of extremities with gangrene, left leg: Secondary | ICD-10-CM | POA: Diagnosis not present

## 2020-11-11 DIAGNOSIS — A419 Sepsis, unspecified organism: Secondary | ICD-10-CM | POA: Diagnosis not present

## 2020-11-11 DIAGNOSIS — Z66 Do not resuscitate: Secondary | ICD-10-CM | POA: Diagnosis not present

## 2020-11-11 DIAGNOSIS — J9601 Acute respiratory failure with hypoxia: Secondary | ICD-10-CM | POA: Diagnosis not present

## 2020-11-11 HISTORY — PX: AMPUTATION: SHX166

## 2020-11-11 LAB — PROTIME-INR
INR: 1.2 (ref 0.8–1.2)
Prothrombin Time: 14.4 seconds (ref 11.4–15.2)

## 2020-11-11 LAB — CBC
HCT: 31.3 % — ABNORMAL LOW (ref 39.0–52.0)
Hemoglobin: 10.2 g/dL — ABNORMAL LOW (ref 13.0–17.0)
MCH: 31.5 pg (ref 26.0–34.0)
MCHC: 32.6 g/dL (ref 30.0–36.0)
MCV: 96.6 fL (ref 80.0–100.0)
Platelets: 244 10*3/uL (ref 150–400)
RBC: 3.24 MIL/uL — ABNORMAL LOW (ref 4.22–5.81)
RDW: 18.7 % — ABNORMAL HIGH (ref 11.5–15.5)
WBC: 14.5 10*3/uL — ABNORMAL HIGH (ref 4.0–10.5)
nRBC: 0 % (ref 0.0–0.2)

## 2020-11-11 LAB — BASIC METABOLIC PANEL
Anion gap: 6 (ref 5–15)
BUN: 9 mg/dL (ref 8–23)
CO2: 26 mmol/L (ref 22–32)
Calcium: 8 mg/dL — ABNORMAL LOW (ref 8.9–10.3)
Chloride: 106 mmol/L (ref 98–111)
Creatinine, Ser: 0.86 mg/dL (ref 0.61–1.24)
GFR, Estimated: >60 mL/min (ref >60)
Glucose, Bld: 139 mg/dL — ABNORMAL HIGH (ref 70–99)
Potassium: 3.8 mmol/L (ref 3.5–5.1)
Sodium: 138 mmol/L (ref 135–145)

## 2020-11-11 LAB — GLUCOSE, CAPILLARY
Glucose-Capillary: 167 mg/dL — ABNORMAL HIGH (ref 70–99)
Glucose-Capillary: 147 mg/dL — ABNORMAL HIGH (ref 70–99)
Glucose-Capillary: 236 mg/dL — ABNORMAL HIGH (ref 70–99)
Glucose-Capillary: 197 mg/dL — ABNORMAL HIGH (ref 70–99)

## 2020-11-11 SURGERY — AMPUTATION, ABOVE KNEE
Anesthesia: General | Site: Knee | Laterality: Left

## 2020-11-11 MED ORDER — PHENYLEPHRINE HCL-NACL 10-0.9 MG/250ML-% IV SOLN
INTRAVENOUS | Status: DC | PRN
  Administered 2020-11-11: 25 ug/min via INTRAVENOUS

## 2020-11-11 MED ORDER — 0.9 % SODIUM CHLORIDE (POUR BTL) OPTIME
TOPICAL | Status: DC | PRN
  Administered 2020-11-11: 1000 mL

## 2020-11-11 MED ORDER — PROPOFOL 10 MG/ML IV BOLUS
INTRAVENOUS | Status: AC
  Filled 2020-11-11: qty 20

## 2020-11-11 MED ORDER — LACTATED RINGERS IV SOLN: INTRAVENOUS | Status: DC

## 2020-11-11 MED ORDER — ALBUMIN HUMAN 5 % IV SOLN: INTRAVENOUS | Status: DC | PRN

## 2020-11-11 MED ORDER — LIDOCAINE 2% (20 MG/ML) 5 ML SYRINGE
INTRAMUSCULAR | Status: DC | PRN
  Administered 2020-11-11: 60 mg via INTRAVENOUS

## 2020-11-11 MED ORDER — CHLORHEXIDINE GLUCONATE 0.12 % MT SOLN
15.0000 mL | Freq: Once | OROMUCOSAL | Status: DC
  Filled 2020-11-11: qty 15

## 2020-11-11 MED ORDER — FENTANYL CITRATE (PF) 250 MCG/5ML IJ SOLN
INTRAMUSCULAR | Status: AC
  Filled 2020-11-11: qty 5

## 2020-11-11 MED ORDER — ONDANSETRON HCL 4 MG/2ML IJ SOLN
INTRAMUSCULAR | Status: AC
  Filled 2020-11-11: qty 2

## 2020-11-11 MED ORDER — FENTANYL CITRATE (PF) 100 MCG/2ML IJ SOLN: 25.0000 ug | INTRAMUSCULAR | Status: DC | PRN

## 2020-11-11 MED ORDER — PROPOFOL 10 MG/ML IV BOLUS
INTRAVENOUS | Status: DC | PRN
  Administered 2020-11-11: 30 mg via INTRAVENOUS
  Administered 2020-11-11: 120 mg via INTRAVENOUS

## 2020-11-11 MED ORDER — ONDANSETRON HCL 4 MG/2ML IJ SOLN: 4.0000 mg | Freq: Once | INTRAMUSCULAR | Status: DC | PRN

## 2020-11-11 MED ORDER — FENTANYL CITRATE (PF) 250 MCG/5ML IJ SOLN
INTRAMUSCULAR | Status: DC | PRN
  Administered 2020-11-11 (×4): 25 ug via INTRAVENOUS

## 2020-11-11 MED ORDER — ONDANSETRON HCL 4 MG/2ML IJ SOLN
INTRAMUSCULAR | Status: DC | PRN
  Administered 2020-11-11: 4 mg via INTRAVENOUS

## 2020-11-11 MED ORDER — ORAL CARE MOUTH RINSE: 15.0000 mL | Freq: Once | OROMUCOSAL | Status: DC

## 2020-11-11 MED ORDER — MIDAZOLAM HCL 2 MG/2ML IJ SOLN
INTRAMUSCULAR | Status: AC
  Filled 2020-11-11: qty 2

## 2020-11-11 MED ORDER — PHENYLEPHRINE 40 MCG/ML (10ML) SYRINGE FOR IV PUSH (FOR BLOOD PRESSURE SUPPORT)
PREFILLED_SYRINGE | INTRAVENOUS | Status: DC | PRN
  Administered 2020-11-11 (×2): 120 ug via INTRAVENOUS
  Administered 2020-11-11: 80 ug via INTRAVENOUS

## 2020-11-11 MED ORDER — ACETAMINOPHEN 10 MG/ML IV SOLN: 1000.0000 mg | Freq: Once | INTRAVENOUS | Status: DC | PRN

## 2020-11-11 MED ORDER — LIDOCAINE 2% (20 MG/ML) 5 ML SYRINGE
INTRAMUSCULAR | Status: AC
  Filled 2020-11-11: qty 5

## 2020-11-11 SURGICAL SUPPLY — 60 items
APL PRP STRL LF DISP 70% ISPRP (MISCELLANEOUS) ×1
BANDAGE ESMARK 6X9 LF (GAUZE/BANDAGES/DRESSINGS) ×1 IMPLANT
BLADE CORE FAN STRYKER (BLADE) ×2 IMPLANT
BLADE SAGITTAL (BLADE)
BLADE SAGITTAL 25.0X1.19X90 (BLADE) IMPLANT
BLADE SAW GIGLI 510 (BLADE) ×2 IMPLANT
BLADE SAW THK.89X75X18XSGTL (BLADE) IMPLANT
BNDG CMPR 9X6 STRL LF SNTH (GAUZE/BANDAGES/DRESSINGS) ×1
BNDG COHESIVE 6X5 TAN STRL LF (GAUZE/BANDAGES/DRESSINGS) ×2 IMPLANT
BNDG ELASTIC 4X5.8 VLCR STR LF (GAUZE/BANDAGES/DRESSINGS) ×2 IMPLANT
BNDG ELASTIC 6X5.8 VLCR STR LF (GAUZE/BANDAGES/DRESSINGS) ×2 IMPLANT
BNDG ESMARK 6X9 LF (GAUZE/BANDAGES/DRESSINGS) ×2
BNDG GAUZE ELAST 4 BULKY (GAUZE/BANDAGES/DRESSINGS) ×2 IMPLANT
CANISTER SUCT 3000ML PPV (MISCELLANEOUS) ×2 IMPLANT
CHLORAPREP W/TINT 26 (MISCELLANEOUS) ×2 IMPLANT
CLIP VESOCCLUDE MED 6/CT (CLIP) ×2 IMPLANT
COVER SURGICAL LIGHT HANDLE (MISCELLANEOUS) ×2 IMPLANT
COVER WAND RF STERILE (DRAPES) ×2 IMPLANT
CUFF TOURN SGL QUICK 24 (TOURNIQUET CUFF)
CUFF TOURN SGL QUICK 34 (TOURNIQUET CUFF)
CUFF TOURN SGL QUICK 42 (TOURNIQUET CUFF) ×1 IMPLANT
CUFF TRNQT CYL 24X4X16.5-23 (TOURNIQUET CUFF) IMPLANT
CUFF TRNQT CYL 34X4.125X (TOURNIQUET CUFF) IMPLANT
DRAIN CHANNEL 19F RND (DRAIN) IMPLANT
DRAPE HALF SHEET 40X57 (DRAPES) ×2 IMPLANT
DRAPE ORTHO SPLIT 77X108 STRL (DRAPES) ×4
DRAPE SURG ORHT 6 SPLT 77X108 (DRAPES) ×2 IMPLANT
ELECT CAUTERY BLADE 6.4 (BLADE) ×2 IMPLANT
ELECT REM PT RETURN 9FT ADLT (ELECTROSURGICAL) ×2
ELECTRODE REM PT RTRN 9FT ADLT (ELECTROSURGICAL) ×1 IMPLANT
EVACUATOR SILICONE 100CC (DRAIN) IMPLANT
GAUZE SPONGE 4X4 12PLY STRL (GAUZE/BANDAGES/DRESSINGS) ×2 IMPLANT
GAUZE XEROFORM 5X9 LF (GAUZE/BANDAGES/DRESSINGS) ×2 IMPLANT
GLOVE SURG SS PI 8.0 STRL IVOR (GLOVE) ×2 IMPLANT
GOWN STRL REUS W/ TWL LRG LVL3 (GOWN DISPOSABLE) ×2 IMPLANT
GOWN STRL REUS W/ TWL XL LVL3 (GOWN DISPOSABLE) ×1 IMPLANT
GOWN STRL REUS W/TWL LRG LVL3 (GOWN DISPOSABLE) ×4
GOWN STRL REUS W/TWL XL LVL3 (GOWN DISPOSABLE) ×2
KIT BASIN OR (CUSTOM PROCEDURE TRAY) ×2 IMPLANT
KIT TURNOVER KIT B (KITS) ×2 IMPLANT
NS IRRIG 1000ML POUR BTL (IV SOLUTION) ×2 IMPLANT
PACK GENERAL/GYN (CUSTOM PROCEDURE TRAY) ×2 IMPLANT
PAD ARMBOARD 7.5X6 YLW CONV (MISCELLANEOUS) ×4 IMPLANT
PENCIL SMOKE EVACUATOR (MISCELLANEOUS) ×2 IMPLANT
STAPLER VISISTAT 35W (STAPLE) ×2 IMPLANT
STOCKINETTE IMPERVIOUS LG (DRAPES) ×2 IMPLANT
SUT ETHILON 2 0 PSLX (SUTURE) ×2 IMPLANT
SUT ETHILON 3 0 PS 1 (SUTURE) IMPLANT
SUT PROLENE 3 0 SH 1 (SUTURE) ×1 IMPLANT
SUT SILK 0 TIES 10X30 (SUTURE) ×2 IMPLANT
SUT SILK 2 0 (SUTURE) ×2
SUT SILK 2 0 SH CR/8 (SUTURE) ×2 IMPLANT
SUT SILK 2-0 18XBRD TIE 12 (SUTURE) ×1 IMPLANT
SUT SILK 3 0 (SUTURE)
SUT SILK 3-0 18XBRD TIE 12 (SUTURE) IMPLANT
SUT VIC AB 2-0 CT1 18 (SUTURE) ×4 IMPLANT
TAPE UMBILICAL COTTON 1/8X30 (MISCELLANEOUS) ×2 IMPLANT
TOWEL GREEN STERILE (TOWEL DISPOSABLE) ×4 IMPLANT
UNDERPAD 30X36 HEAVY ABSORB (UNDERPADS AND DIAPERS) ×2 IMPLANT
WATER STERILE IRR 1000ML POUR (IV SOLUTION) ×2 IMPLANT

## 2020-11-11 NOTE — Anesthesia Postprocedure Evaluation (Signed)
Anesthesia Post Note  Patient: [name]  Procedure(s) Performed: AMPUTATION LEFT  ABOVE KNEE (Left Knee)     Patient location during evaluation: PACU Anesthesia Type: General Level of consciousness: awake and alert Pain management: pain level controlled Vital Signs Assessment: post-procedure vital signs reviewed and stable Respiratory status: spontaneous breathing, nonlabored ventilation, respiratory function stable and patient connected to nasal cannula oxygen Cardiovascular status: blood pressure returned to baseline and stable Postop Assessment: no apparent nausea or vomiting Anesthetic complications: no   No complications documented.  Last Vitals:  Vitals:   11/11/20 1215 11/11/20 1300  BP: (!) 143/71 132/68  Pulse: (!) 105 (!) 104  Resp: (!) 27 17  Temp: 36.6 C 36.7 C  SpO2: 95% 95%    Last Pain:  Vitals:   11/11/20 1300  TempSrc: Oral  PainSc:                  March Rummage Vinette Crites

## 2020-11-11 NOTE — Progress Notes (Signed)
   ASSESSMENT & PLAN:  [name] is a 70 y.o. male non-salvageable LLE ischemic ulceration / gangrene. For L AKA today.  SUBJECTIVE:  No complaints. Ready for AKA.  OBJECTIVE:  BP 110/80 (BP Location: Left Arm)   Pulse (!) 106   Temp 98.2 F (36.8 C) (Oral)   Resp 15   Ht 5\' 7"  (1.702 m)   Wt 110.3 kg   SpO2 93%   BMI 38.09 kg/m   Intake/Output Summary (Last 24 hours) at 11/11/2020 0925 Last data filed at 11/11/2020 0700 Gross per 24 hour  Intake 200 ml  Output 2050 ml  Net -1850 ml    Unchanged appearance of tissue loss  CBC Latest Ref Rng & Units 11/11/2020 11/10/2020 11/09/2020  WBC 4.0 - 10.5 K/uL 14.5(H) 13.5(H) 13.7(H)  Hemoglobin 13.0 - 17.0 g/dL 10.2(L) 10.8(L) 10.4(L)  Hematocrit 39.0 - 52.0 % 31.3(L) 34.4(L) 32.3(L)  Platelets 150 - 400 K/uL 244 277 273     CMP Latest Ref Rng & Units 11/11/2020 11/10/2020 11/09/2020  Glucose 70 - 99 mg/dL 139(H) 144(H) 142(H)  BUN 8 - 23 mg/dL 9 10 12   Creatinine 0.61 - 1.24 mg/dL 0.86 0.85 0.92  Sodium 135 - 145 mmol/L 138 137 138  Potassium 3.5 - 5.1 mmol/L 3.8 4.1 4.2  Chloride 98 - 111 mmol/L 106 106 106  CO2 22 - 32 mmol/L 26 24 24   Calcium 8.9 - 10.3 mg/dL 8.0(L) 8.1(L) 7.8(L)  Total Protein 6.5 - 8.1 g/dL - - -  Total Bilirubin 0.3 - 1.2 mg/dL - - -  Alkaline Phos 38 - 126 U/L - - -  AST 15 - 41 U/L - - -  ALT 0 - 44 U/L - - -    Estimated Creatinine Clearance: 97.4 mL/min (by C-G formula based on SCr of 0.86 mg/dL).  Yevonne Aline. Stanford Breed, MD [facility] Phone Number: [phone] 11/11/2020 9:25 AM
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
## 2020-11-11 NOTE — Progress Notes (Signed)
PROGRESS NOTE    [name]  [id number] DOB: [birth year] DOA: 10/25/2020 PCP: Leeanne Rio, MD   Chief Complaint  Patient presents with  . Foot Pain  . Foot Ulcer  Brief Narrative: 70 year old male with hypertension HLD, T2DM, a flutter, CAD, history of a stroke with left hemiparesis admitted with left foot wound from recent trauma.  Patient Dr Marc Morgans for antibiotics at [facility] vascular surgery was consulted and transferred to [facility] 1/17-but patient refused amputation. Patient had complicated hospitalization with acute hypoxic respiratory failure acute on chronic systolic CHF.  Seen by cardiology and vascular.  Palliative care was consulted since he initially refused amputation. After family meeting with palliative care 11/04/20 patient and wife wanted to proceed with amputation-vascular surgery and cardiology were notified. 1/26: Family again decided against surgery ID was consulted-antibiotics were adjusted. 1/29-family reverted to full code and wants to proceed with surgery on the understanding he is at high risk, vascular surgery has been notified.  Subjective:  Seen/examined, no new complaints, Had lt BKA amputation this morning. 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  Overall stable continue on Lasix and Toprol.  Seen by cardiology preop-is at high risk but not prohibitive given his  underlying CAD/CHF, not a good candidate for invasive work-up at this time and cardiology has signed off. Net IO Since Admission: -5,017.34 mL [11/11/20 1331]  CAD multivessel with history of CABG  12/2013/ hyperlipidemia/mildly elevated troponin subtle elevation flat trend nonischemic: Stable, continue Lipitor, Plavix, metoprolol. No further clinical work-up recommended.  PAF/a flutter with RVR : Rate is stable, continue Toprol, amiodarone.therapeutic Lovenox held preoperatively.Patient will need long-term DOAC given chads 2 vasc score of 8 as per cardio, await for vascular surgery decision regarding when he can resume anticoagulation.  Acute hypoxic respiratory failure resolved.  Supplemental oxygen postop.  Insulin-dependent type 2 diabetes mellitus, HbA1c 6.9.  Blood sugar well controlled continue Lantus 10 units,and SSI.  Recent Labs  Lab 11/10/20 1228 11/10/20 1600 11/10/20 2105 11/11/20 0903 11/11/20 1118  GLUCAP 153* 277* 286* 167* 147*   Hypokalemia/magnesium continue daily potassium supplement  History of CVA with left hemiparesis/PAD/HTN: Remains statin/Plavix.    CKD stage II, renal function is stable.Monitor Recent Labs  Lab 11/07/20 0500 11/08/20 0500 11/09/20 0858 11/10/20 0650 11/11/20 0430  BUN 10 12 12 10 9   CREATININE 0.93 1.02 0.92 0.85 0.86   Dementia: Patient has been alert awake but confused with baseline dementia oriented x2.  Continue on Aricept/Namenda, supportive care fall precaution.  GERD: Stable on ppi  BPH-stable on finasteride  Morbid obesity with body mass index is 38.09 kg/m.PCP follow-up for outpatient weight loss.   Bleeding in the perineal area wound type MASD, contact dermatitis from fecal incontinence, wound care following continue management-seems to be improving.  [id number] care has been following very closely.After extensive family initially wanted to proceed with surgery but decided against it. After PMT meeting 1/29 family  wants to proceed with amputation.  He has been changed to full code.   Nutrition: Diet Order            Diet NPO time specified Except for: Sips with Meds  Diet effective midnight                DVT prophylaxis: SCDs Start: 10/25/20 2246 Code Status:   Code Status: Full Code  Family Communication: plan of care discussed with patient at bedside.  Status is: Inpatient Remains inpatient appropriate because:  of ongoing management of osteomyelitis now status post amputation 1/31  Dispo: The patient is from:Home              Anticipated d/c is to:TBD              Anticipated d/c date is:3 days              Patient currently is not medically stable to d/c.   Difficult to place patient Yes  Consultants:see note  Procedures:see note  Culture/Microbiology    Component Value Date/Time   SDES  10/25/2020 2210    IN/OUT CATH URINE Performed at [facility], [address], [place], [place] [address]    [facility]  10/25/2020 2210    NONE Performed at [facility], [address], [place], [place] [address]    CULT 40,000 COLONIES/mL ENTEROBACTER AEROGENES (A) 10/25/2020 2210   REPTSTATUS 10/28/2020 FINAL 10/25/2020 2210    Other culture-see note  Medications: Scheduled Meds: . amiodarone  200 mg Oral Daily  . atorvastatin  40 mg Oral Daily  . baclofen  5 mg Oral TID  . Chlorhexidine Gluconate Cloth  6 each Topical Daily  . cholecalciferol  5,000 Units Oral Q1200  . clopidogrel  75 mg Oral Daily  . donepezil  5 mg Oral QHS  . feeding supplement (GLUCERNA SHAKE)  237 mL Oral TID BM  . ferrous sulfate  325 mg Oral BID WC  . finasteride  5 mg Oral Q1200  . furosemide  40 mg Oral Daily  . gabapentin  600 mg Oral TID  . Gerhardt's butt cream   Topical QID  . insulin aspart  0-15 Units Subcutaneous TID WC  . insulin aspart  0-5 Units Subcutaneous QHS  . insulin glargine  10 Units Subcutaneous QHS  . loratadine  5 mg Oral QHS  . memantine  5 mg Oral BID  . metoprolol  succinate  25 mg Oral Daily  . metroNIDAZOLE  500 mg Oral Q8H  . pantoprazole  40 mg Oral Daily  . potassium chloride  20 mEq Oral Daily  . sodium chloride flush  10-40 mL Intracatheter Q12H   Continuous Infusions: . cefTRIAXone (ROCEPHIN)  IV 2 g (11/10/20 0932)  . DAPTOmycin (CUBICIN)  IV 670 mg (11/10/20 2013)    Antimicrobials: Anti-infectives (From admission, onward)   Start     Dose/Rate Route Frequency Ordered Stop   11/07/20 1000  cefTRIAXone (ROCEPHIN) 2 g in sodium chloride 0.9 % 100 mL IVPB        2 g 200 mL/hr over 30 Minutes Intravenous Every 24 hours 11/07/20 0742     11/07/20 1000  DAPTOmycin (CUBICIN) 670 mg in sodium chloride 0.9 % IVPB        670 mg 226.8 mL/hr over 30 Minutes Intravenous Daily 11/07/20 0746     11/03/20 2200  metroNIDAZOLE (FLAGYL) tablet 500 mg        500 mg Oral Every 8 hours 11/03/20 1554  11/03/20 2000  sulfamethoxazole-trimethoprim (BACTRIM DS) 800-160 MG per tablet 1 tablet  Status:  Discontinued        1 tablet Oral Every 12 hours 11/03/20 1554 11/07/20 0742   11/01/20 0915  cefTRIAXone (ROCEPHIN) 2 g in sodium chloride 0.9 % 100 mL IVPB  Status:  Discontinued        2 g 200 mL/hr over 30 Minutes Intravenous Every 24 hours 11/01/20 0816 11/03/20 1551   10/29/20 1400  vancomycin (VANCOREADY) IVPB 750 mg/150 mL  Status:  Discontinued        750 mg 150 mL/hr over 60 Minutes Intravenous Every 12 hours 10/29/20 1238 11/01/20 0816   10/28/20 1400  vancomycin (VANCOCIN) IVPB 1000 mg/200 mL premix  Status:  Discontinued        1,000 mg 200 mL/hr over 60 Minutes Intravenous Every 12 hours 10/28/20 1344 10/29/20 1238   10/28/20 0900  metroNIDAZOLE (FLAGYL) IVPB 500 mg  Status:  Discontinued        500 mg 100 mL/hr over 60 Minutes Intravenous Every 8 hours 10/28/20 0808 11/03/20 1551   10/26/20 1000  vancomycin (VANCOCIN) IVPB 1000 mg/200 mL premix  Status:  Discontinued        1,000 mg 200 mL/hr over 60 Minutes Intravenous Every 12 hours  10/25/20 2037 10/28/20 1344   10/25/20 2200  ceFEPIme (MAXIPIME) 2 g in sodium chloride 0.9 % 100 mL IVPB  Status:  Discontinued        2 g 200 mL/hr over 30 Minutes Intravenous Every 8 hours 10/25/20 2037 11/01/20 0816   10/25/20 2030  vancomycin (VANCOREADY) IVPB 2000 mg/400 mL        2,000 mg 200 mL/hr over 120 Minutes Intravenous NOW 10/25/20 2027 10/25/20 2248   10/25/20 1930  vancomycin (VANCOCIN) IVPB 1000 mg/200 mL premix  Status:  Discontinued        1,000 mg 200 mL/hr over 60 Minutes Intravenous  Once 10/25/20 1928 10/25/20 2136   10/25/20 1930  cefTRIAXone (ROCEPHIN) 2 g in sodium chloride 0.9 % 100 mL IVPB        2 g 200 mL/hr over 30 Minutes Intravenous  Once 10/25/20 1928 10/25/20 2040     Objective: Vitals: Today's Vitals   11/11/20 1130 11/11/20 1145 11/11/20 1200 11/11/20 1215  BP: 107/63 125/69 133/61 (!) 143/71  Pulse: (!) 103 (!) 51 (!) 106 (!) 105  Resp: 16 (!) 25 (!) 35 (!) 27  Temp:    97.9 F (36.6 C)  TempSrc:      SpO2: 92% 94% 92% 95%  Weight:      Height:      PainSc: 0-No pain 0-No pain 0-No pain 0-No pain    Intake/Output Summary (Last 24 hours) at 11/11/2020 1331 Last data filed at 11/11/2020 1101 Gross per 24 hour  Intake 700 ml  Output 2150 ml  Net -1450 ml   Filed Weights   10/25/20 1824 10/26/20 1850 10/27/20 0500  Weight: 116.1 kg 110.5 kg 110.3 kg   Weight change:   Intake/Output from previous day: 01/30 0701 - 01/31 0700 In: 200 [IV Piggyback:200] Out: 2050 [Urine:2050] Intake/Output this shift: Total I/O In: 500 [I.V.:250; IV Piggyback:250] Out: 100 [Blood:100] Filed Weights   10/25/20 1824 10/26/20 1850 10/27/20 0500  Weight: 116.1 kg 110.5 kg 110.3 kg    Examination: General exam: AAOx2, obese, old for age,weak appearing. HEENT:Oral mucosa moist, Ear/Nose WNL grossly, dentition normal. Respiratory system: bilaterally diminished,no wheezing or crackles,no use of accessory muscle Cardiovascular  system: S1 & S2 +, No  JVD,. Gastrointestinal system: Abdomen soft, NT,ND, BS+ Nervous System:Alert, awake,baselien confusion Extremities: Left BKA with dressing in place   Skin: No rashes,no icterus. MSK: Normal muscle bulk,tone, power  Data Reviewed: I have personally reviewed following labs and imaging studies CBC: Recent Labs  Lab 11/07/20 0500 11/08/20 0500 11/09/20 0550 11/10/20 0650 11/11/20 0430  WBC 15.8* 14.7* 13.7* 13.5* 14.5*  HGB 11.4* 10.6* 10.4* 10.8* 10.2*  HCT 35.6* 33.9* 32.3* 34.4* 31.3*  MCV 96.0 98.0 96.7 97.2 96.6  PLT 263 269 273 277 700   Basic Metabolic Panel: Recent Labs  Lab 11/07/20 0500 11/08/20 0500 11/09/20 0858 11/10/20 0650 11/11/20 0430  NA 141 140 138 137 138  K 4.0 3.8 4.2 4.1 3.8  CL 107 107 106 106 106  CO2 25 23 24 24 26   GLUCOSE 141* 147* 142* 144* 139*  BUN 10 12 12 10 9   CREATININE 0.93 1.02 0.92 0.85 0.86  CALCIUM 7.9* 7.8* 7.8* 8.1* 8.0*   GFR: Estimated Creatinine Clearance: 97.4 mL/min (by C-G formula based on SCr of 0.86 mg/dL). Liver Function Tests: No results for input(s): AST, ALT, ALKPHOS, BILITOT, PROT, ALBUMIN in the last 168 hours. No results for input(s): LIPASE, AMYLASE in the last 168 hours. No results for input(s): AMMONIA in the last 168 hours. Coagulation Profile: Recent Labs  Lab 11/11/20 0430  INR 1.2   Cardiac Enzymes: Recent Labs  Lab 11/07/20 0500  CKTOTAL 157   BNP (last 3 results) No results for input(s): PROBNP in the last 8760 hours. HbA1C: No results for input(s): HGBA1C in the last 72 hours. CBG: Recent Labs  Lab 11/10/20 1228 11/10/20 1600 11/10/20 2105 11/11/20 0903 11/11/20 1118  GLUCAP 153* 277* 286* 167* 147*   Lipid Profile: No results for input(s): CHOL, HDL, LDLCALC, TRIG, CHOLHDL, LDLDIRECT in the last 72 hours. Thyroid Function Tests: No results for input(s): TSH, T4TOTAL, FREET4, T3FREE, THYROIDAB in the last 72 hours. Anemia Panel: No results for input(s): VITAMINB12, FOLATE,  FERRITIN, TIBC, IRON, RETICCTPCT in the last 72 hours. Sepsis Labs: No results for input(s): PROCALCITON, LATICACIDVEN in the last 168 hours.  Recent Results (from the past 240 hour(s))  SARS Coronavirus 2 by RT PCR (hospital order, performed in [facility] hospital lab) Nasopharyngeal Nasopharyngeal Swab     Status: None   Collection Time: 11/10/20  1:04 PM   Specimen: Nasopharyngeal Swab  Result Value Ref Range Status   SARS Coronavirus 2 NEGATIVE NEGATIVE Final    Comment: (NOTE) SARS-CoV-2 target nucleic acids are NOT DETECTED.  The SARS-CoV-2 RNA is generally detectable in upper and lower respiratory specimens during the acute phase of infection. The lowest concentration of SARS-CoV-2 viral copies this assay can detect is 250 copies / mL. A negative result does not preclude SARS-CoV-2 infection and should not be used as the sole basis for treatment or other patient management decisions.  A negative result may occur with improper specimen collection / handling, submission of specimen other than nasopharyngeal swab, presence of viral mutation(s) within the areas targeted by this assay, and inadequate number of viral copies (<250 copies / mL). A negative result must be combined with clinical observations, patient history, and epidemiological information.  Fact Sheet for Patients:   StrictlyIdeas.no  Fact Sheet for Healthcare Providers: BankingDealers.co.za  This test is not yet approved or  cleared by the [place] FDA and has been authorized for detection and/or diagnosis of SARS-CoV-2 by FDA under an Emergency Use Authorization (  EUA).  This EUA will remain in effect (meaning this test can be used) for the duration of the COVID-19 declaration under Section 564(b)(1) of the Act, 21 U.S.C. section 360bbb-3(b)(1), unless the authorization is terminated or revoked sooner.  Performed at [facility], [place] [address],  [place], [place] [address]      Radiology Studies: No results found.   LOS: 17 days   Antonieta Pert, MD [facility]  11/11/2020, 1:31 PM

## 2020-11-11 NOTE — Anesthesia Procedure Notes (Signed)
Procedure Name: LMA Insertion Date/Time: 11/11/2020 9:52 AM Performed by: Dorthea Cove, CRNA Pre-anesthesia Checklist: Patient identified, Emergency Drugs available, Suction available and Patient being monitored Patient Re-evaluated:Patient Re-evaluated prior to induction Oxygen Delivery Method: Circle System Utilized Preoxygenation: Pre-oxygenation with 100% oxygen Induction Type: IV induction Ventilation: Mask ventilation without difficulty and Oral airway inserted - appropriate to patient size LMA: LMA inserted LMA Size: 5.0 Number of attempts: 1 Airway Equipment and Method: Bite block Placement Confirmation: positive ETCO2 and CO2 detector Tube secured with: Tape Dental Injury: Teeth and Oropharynx as per pre-operative assessment

## 2020-11-11 NOTE — Progress Notes (Signed)
Patient resting in bed c/o pain. Patient alert with wife at the bedside.

## 2020-11-11 NOTE — Op Note (Signed)
DATE OF SERVICE: 11/11/2020  PATIENT:  [name]  70 y.o. male  PRE-OPERATIVE DIAGNOSIS:  Atherosclerosis of native arteries of left lower extremity causing gangrene  POST-OPERATIVE DIAGNOSIS:  Same  PROCEDURE:   Left above knee amputation  SURGEON:  Surgeon(s) and Role:    * Cherre Robins, MD - Primary  ASSISTANT: Risa Grill, PA-C  An assistant was required to facilitate exposure and expedite the case.  ANESTHESIA:   general  EBL: 120mL  BLOOD ADMINISTERED:none  DRAINS: none   LOCAL MEDICATIONS USED:  NONE  SPECIMEN:  Residual left leg  COUNTS: confirmed correct.  TOURNIQUET:  <83min  PATIENT DISPOSITION:  PACU - hemodynamically stable.   Delay start of Pharmacological VTE agent (>24hrs) due to surgical blood loss or risk of bleeding: no  INDICATION FOR PROCEDURE: [name] is a 70 y.o. male with non-salvageable left lower extremity peripheral vascular disease with tissue loss. After careful discussion of risks, benefits, and alternatives the patient was offered left above knee amputation. The patient and wife understood and wished to proceed.  OPERATIVE FINDINGS: healthy tissue at margins.   DESCRIPTION OF PROCEDURE: After identification of the patient in the pre-operative holding area, the patient was transferred to the operating room. The patient was positioned supine on the operating room table. Anesthesia was induced. The left leg was prepped and draped in standard fashion. A surgical pause was performed confirming correct patient, procedure, and operative location.  A generous left thigh fishmouth incision was planned on the right lower extremity with a skin marker. An incision was made as planned. The incision was carried down through the subcutaneous tissue until the musculature of the thigh was encountered.  The musculature was divided with Bovie electrocautery.  The vascular bundle was found in its typical position, and was suture-ligated  proximally using a 2-0 Prolene suture.  The sciatic nerve was identified in typical position, retracted, transected at the retracted margin, and ligated with a 2-0 silk suture.  The bone was transected with a powered saw.  Hemostasis was achieved in the surgical bed.  The wound was closed in layers using 2-0 Vicryl, 2-0 nylon, and a surgical stapler.  A clean bandage was applied.  Upon completion of the case instrument and sharps counts were confirmed correct. The patient was transferred to the PACU in good condition. I was present for all portions of the procedure.  [name] Aline. Stanford Breed, MD [facility] Phone Number: [phone] 11/11/2020 11:16 AM

## 2020-11-11 NOTE — Progress Notes (Signed)
Report called to the OR. Patient assessed, no complaints voiced at this time. Wife and son are at the bedside. Patient is off the unit to the OR.

## 2020-11-11 NOTE — Transfer of Care (Signed)
Immediate Anesthesia Transfer of Care Note  Patient: [name]  Procedure(s) Performed: AMPUTATION LEFT  ABOVE KNEE (Left Knee)  Patient Location: PACU  Anesthesia Type:General  Level of Consciousness: awake, alert  and patient cooperative  Airway & Oxygen Therapy: Patient Spontanous Breathing and Patient connected to nasal cannula oxygen  Post-op Assessment: Report given to RN and Post -op Vital signs reviewed and stable  Post vital signs: Reviewed and stable  Last Vitals:  Vitals Value Taken Time  BP 109/64 11/11/20 1115  Temp    Pulse 59 11/11/20 1117  Resp 24 11/11/20 1117  SpO2 92 % 11/11/20 1117  Vitals shown include unvalidated device data.  Last Pain:  Vitals:   11/11/20 0342  TempSrc: Oral  PainSc:       Patients Stated Pain Goal: 2 (70/78/67 5449)  Complications: No complications documented.

## 2020-11-11 NOTE — Progress Notes (Signed)
Pharmacy Antibiotic Note  [name] is a 70 y.o. male admitted on 10/25/2020 with L foot osteomyelitis. Family initially refusing amputation then agreed but now refusing again. ID consulted and recommended daptomycin and ceftriaxone.  Pharmacy has been consulted for daptomycin dosing.  On daptomycin 670mg  (~8 mg/kg adjusted BW per BMI > 30) once daily. BL CK 157 on 1/27. Of note, patient is also on atorvastatin 40mg  daily. Will monitor CK and any signs of myalgias/rhabdo closely.   Plan: Continue daptomycin 670mg  once daily Continue ceftriaxone and metronidazole PO per ID Continue Dapto and ceftriaxone through 2/24/222 Check baseline and weekly CK, follow Scr, cultures   Height: 5\' 7"  (170.2 cm) Weight: 110.3 kg (243 lb 2.7 oz) IBW/kg (Calculated) : 66.1  Temp (24hrs), Avg:98.2 F (36.8 C), Min:97.9 F (36.6 C), Max:98.6 F (37 C)  Recent Labs  Lab 11/07/20 0500 11/08/20 0500 11/09/20 0550 11/09/20 0858 11/10/20 0650 11/11/20 0430  WBC 15.8* 14.7* 13.7*  --  13.5* 14.5*  CREATININE 0.93 1.02  --  0.92 0.85 0.86    Estimated Creatinine Clearance: 97.4 mL/min (by C-G formula based on SCr of 0.86 mg/dL).    Allergies  Allergen Reactions  . Flexeril [Cyclobenzaprine] Shortness Of Breath  . Tamsulosin Hcl Swelling, Rash and Other (See Comments)    Swelling around the mouth with rash on face Swelling around the mouth with rash on face Swelling around the mouth with rash on face  Swelling around the mouth with rash on face  . Tramadol Other (See Comments)    Headache  . Robaxin [Methocarbamol] Rash  . Aspirin     On plavix  . Morphine And Related Itching    Antimicrobials this admission: Vanc 1/14 >> 1/21 Cefepime 1/14 >> 1/21 Flagyl 1/17 >> CTX 1/21 >> 1/23, restart 1/27>> SMX/TMP 1/23>>1/27 Daptomycin 1/27>>  Microbiology results: 1/15 BCx - negative 1/16 UCx - 40K Enterobacter (R-cefaz) 1/15 MRSA PCR - negative  Thank you for allowing pharmacy to be a  part of this patient's care.  Albertina Parr, PharmD., BCPS, BCCCP Clinical Pharmacist Please refer to [facility] for unit-specific pharmacist

## 2020-11-12 ENCOUNTER — Encounter (HOSPITAL_COMMUNITY): Payer: Self-pay | Admitting: Vascular Surgery

## 2020-11-12 ENCOUNTER — Inpatient Hospital Stay (HOSPITAL_COMMUNITY): Payer: Medicare Other

## 2020-11-12 DIAGNOSIS — I4892 Unspecified atrial flutter: Secondary | ICD-10-CM | POA: Diagnosis not present

## 2020-11-12 DIAGNOSIS — M86172 Other acute osteomyelitis, left ankle and foot: Secondary | ICD-10-CM | POA: Diagnosis not present

## 2020-11-12 LAB — BASIC METABOLIC PANEL
Anion gap: 8 (ref 5–15)
BUN: 8 mg/dL (ref 8–23)
CO2: 25 mmol/L (ref 22–32)
Calcium: 8 mg/dL — ABNORMAL LOW (ref 8.9–10.3)
Chloride: 103 mmol/L (ref 98–111)
Creatinine, Ser: 0.9 mg/dL (ref 0.61–1.24)
GFR, Estimated: >60 mL/min (ref >60)
Glucose, Bld: 223 mg/dL — ABNORMAL HIGH (ref 70–99)
Potassium: 4.2 mmol/L (ref 3.5–5.1)
Sodium: 136 mmol/L (ref 135–145)

## 2020-11-12 LAB — GLUCOSE, CAPILLARY
Glucose-Capillary: 191 mg/dL — ABNORMAL HIGH (ref 70–99)
Glucose-Capillary: 223 mg/dL — ABNORMAL HIGH (ref 70–99)
Glucose-Capillary: 131 mg/dL — ABNORMAL HIGH (ref 70–99)
Glucose-Capillary: 269 mg/dL — ABNORMAL HIGH (ref 70–99)

## 2020-11-12 LAB — URINALYSIS, MICROSCOPIC (REFLEX)

## 2020-11-12 LAB — URINALYSIS, ROUTINE W REFLEX MICROSCOPIC
Bilirubin Urine: NEGATIVE
Glucose, UA: NEGATIVE mg/dL
Ketones, ur: NEGATIVE mg/dL
Leukocytes,Ua: NEGATIVE
Nitrite: POSITIVE — AB
Protein, ur: 30 mg/dL — AB
Specific Gravity, Urine: >1.03 — ABNORMAL HIGH (ref 1.005–1.030)
pH: 6 (ref 5.0–8.0)

## 2020-11-12 LAB — CBC
HCT: 31.9 % — ABNORMAL LOW (ref 39.0–52.0)
Hemoglobin: 10 g/dL — ABNORMAL LOW (ref 13.0–17.0)
MCH: 30.8 pg (ref 26.0–34.0)
MCHC: 31.3 g/dL (ref 30.0–36.0)
MCV: 98.2 fL (ref 80.0–100.0)
Platelets: 245 10*3/uL (ref 150–400)
RBC: 3.25 MIL/uL — ABNORMAL LOW (ref 4.22–5.81)
RDW: 18.6 % — ABNORMAL HIGH (ref 11.5–15.5)
WBC: 14.4 10*3/uL — ABNORMAL HIGH (ref 4.0–10.5)
nRBC: 0 % (ref 0.0–0.2)

## 2020-11-12 LAB — BLOOD GAS, VENOUS
Acid-Base Excess: 1.1 mmol/L (ref 0.0–2.0)
Bicarbonate: 25.6 mmol/L (ref 20.0–28.0)
FIO2: 24
O2 Saturation: 52.1 %
Patient temperature: 36.8
pCO2, Ven: 43.7 mmHg — ABNORMAL LOW (ref 44.0–60.0)
pH, Ven: 7.385 (ref 7.250–7.430)
pO2, Ven: <31 mmHg — CL (ref 32.0–45.0)

## 2020-11-12 MED ORDER — GABAPENTIN 600 MG PO TABS
300.0000 mg | ORAL_TABLET | Freq: Three times a day (TID) | ORAL | Status: DC
  Administered 2020-11-12 – 2020-11-15 (×10): 300 mg via ORAL
  Filled 2020-11-12 (×10): qty 1

## 2020-11-12 MED ORDER — KETOROLAC TROMETHAMINE 30 MG/ML IJ SOLN
15.0000 mg | Freq: Once | INTRAMUSCULAR | Status: AC
  Administered 2020-11-12: 15 mg via INTRAVENOUS
  Filled 2020-11-12: qty 1

## 2020-11-12 MED ORDER — ALTEPLASE 2 MG IJ SOLR
2.0000 mg | Freq: Once | INTRAMUSCULAR | Status: AC
  Administered 2020-11-12: 2 mg
  Filled 2020-11-12: qty 2

## 2020-11-12 NOTE — Progress Notes (Signed)
PROGRESS NOTE    VINCIENT [name]  GMW:[id number] DOB: [birth year] DOA: 10/25/2020 PCP: Leeanne Rio, MD   Chief Complaint  Patient presents with  . Foot Pain  . Foot Ulcer  Brief Narrative: 70 year old male with hypertension HLD, T2DM, a flutter, CAD, history of a stroke with left hemiparesis admitted with left foot wound from recent trauma.  Patient Dr Marc Morgans for antibiotics at [facility] vascular surgery was consulted and transferred to [facility] 1/17-but patient refused amputation. Patient had complicated hospitalization with acute hypoxic respiratory failure acute on chronic systolic CHF.  Seen by cardiology and vascular.  Palliative care was consulted since he initially refused amputation. After family meeting with palliative care 11/04/20 patient and wife wanted to proceed with amputation-vascular surgery and cardiology were notified. 1/26: Family again decided against surgery ID was consulted-antibiotics were adjusted. 1/29-family reverted to full code and wants to proceed with surgery on the understanding he is at high risk, vascular surgery has been notified. 1/31; s/p lt AKA.  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 Recent Labs  Lab 11/08/20 0500 11/09/20 0550 11/10/20 0650 11/11/20 0430 11/12/20 0400  WBC 14.7* 13.7* 13.5* 14.5* 14.4*   Febrile episode overnight 1/31-2/1:Check blood cx/chest x-ray-no obvious pneumonia/UA-pending. Has PICC line Rt arm since 10/27/20  Patient has been on multiple  antibiotics as #1.Vitals otherwise stable, on 1 L [place] oxygen.  Acute metabolic encephalopathy with lethargy/somnolence:multi-factorial- from post op/fever/pain medications/polypharmacy. Patient is able to wake up, moving his upper extremities and right leg in response to pain, able to squeeze with both hands. Checking VBG,discussed w/ RN to hold off opiates.  I will decrease Neurontin dose. Monitor closely. Cont current IV antibiotics, monitor vitals. Will need ongoing palliative care to follow along.  Patient revisited with RN this afternoon.  Chronic combined systolic/diastolic YQI:HKVQ with EF 25 to 30%-which is new, previously 50% 2019, seen by cardiology-on Toprol and Lasix -hold if blood pressure soft today-no plan for invasive work-up per cards.  No evidence of fluid overload.Monitor weight watch for fluid overload.Net IO Since Admission: -5,017.34 mL [11/12/20 0732]  Ischemic cardiomyopathy/CAD-multivessel with history of CABG  12/2013/ hyperlipidemia/mildly elevated troponin subtle elevation flat trend nonischemic: Stable, continue Lipitor, Plavix, metoprolol. No further clinical work-up recommended.  PAF/a flutter with RVR : Rate fairly controlled on Toprol, amiodarone.therapeutic Lovenox held preoperatively.Patient will need long-term DOAC given chads 2 vasc score of 8 as per cardio-await for vascular to see when we can start Eliquis.  Acute hypoxic respiratory failure on oxygen postop, wean as tolerated  Insulin-dependent type 2 diabetes mellitus, stable HbA1c 6.9.  Blood sugar fairly controlled on lANTUS 10 U and SSI, monitor cbg Recent Labs  Lab 11/10/20 2105 11/11/20 0903 11/11/20 1118 11/11/20 1627 11/11/20 2122  GLUCAP 286* 167* 147* 236* 197*   Hypokalemia/magnesium: stable on daily potassium supplement.   History of CVA with left hemiparesis/PAD/HTN: Remains statin/Plavix/Toprol.  Blood pressure is stable.    CKD stage II,at baseline.  Dementia: Overnight lethargic. on  Aricept/Namenda, supportive care fall precaution.  GERD:Continue PPI  QVZ:DGLOVFIE finasteride  Morbid obesity with body mass index is 38.09 kg/m.PCP follow-up for outpatient weight loss.   Bleeding in the perineal area wound type MASD, contact dermatitis from fecal incontinence.  Much improved followed by wound care.  [id number] care has been following very closely.After extensive family  W/ PMT 1/29 family wanted to proceed with amputation. And was changed to full code. Continue to monitor closely high risk of decompensation and readmission. Palliative care to follow along closely.  Nutrition: Diet Order            Diet Carb Modified Fluid consistency: Thin; Room service appropriate? Yes  Diet effective now                DVT prophylaxis: SCDs Start: 10/25/20 2246 Code Status:   Code Status: Full Code  Family Communication: plan of care discussed with patient at bedside.  Status is: Inpatient Remains inpatient appropriate because:  of ongoing management of osteomyelitis now status post amputation 1/31  Dispo: The patient is from:Home              Anticipated d/c is to:TBD, likely SNF.              Anticipated d/c date is:2 days              Patient currently is not medically stable to d/c.   Difficult to place patient Yes  Consultants:see note  Procedures:see note  Culture/Microbiology    Component Value Date/Time   SDES  10/25/2020 2210    IN/OUT CATH URINE Performed at [facility], [address] [place], [place] [address]    [facility]  10/25/2020 2210    NONE Performed at [facility], [address] [place],  [address]    CULT 40,000 COLONIES/mL ENTEROBACTER AEROGENES (A) 10/25/2020 2210   REPTSTATUS 10/28/2020 FINAL 10/25/2020 2210    Other culture-see note  Medications: Scheduled Meds: . amiodarone  200 mg Oral Daily  . atorvastatin  40 mg Oral Daily  . baclofen  5 mg Oral TID  . Chlorhexidine Gluconate Cloth  6 each Topical Daily  .  cholecalciferol  5,000 Units Oral Q1200  . clopidogrel  75 mg Oral Daily  . donepezil  5 mg Oral QHS  . feeding supplement (GLUCERNA SHAKE)  237 mL Oral TID BM  . ferrous sulfate  325 mg Oral BID WC  . finasteride  5 mg Oral Q1200  . furosemide  40 mg Oral Daily  . gabapentin  600 mg Oral TID  . Gerhardt's butt cream   Topical QID  . insulin aspart  0-15 Units Subcutaneous TID WC  . insulin aspart  0-5 Units Subcutaneous QHS  . insulin glargine  10 Units Subcutaneous QHS  . loratadine  5 mg Oral QHS  . memantine  5 mg Oral BID  . metoprolol succinate  25 mg Oral Daily  . metroNIDAZOLE  500 mg Oral Q8H  . pantoprazole  40 mg Oral Daily  . potassium chloride  20 mEq Oral Daily  . sodium chloride flush  10-40 mL Intracatheter Q12H   Continuous Infusions: . cefTRIAXone (ROCEPHIN)  IV 2 g (11/10/20 0932)  . DAPTOmycin (CUBICIN)  IV 670 mg (11/11/20 2026)    Antimicrobials: Anti-infectives (From admission, onward)   Start     Dose/Rate Route Frequency Ordered Stop   11/07/20 1000  cefTRIAXone (ROCEPHIN) 2 g in sodium chloride 0.9 % 100 mL IVPB        2 g 200 mL/hr over 30 Minutes Intravenous  Every 24 hours 11/07/20 0742     11/07/20 1000  DAPTOmycin (CUBICIN) 670 mg in sodium chloride 0.9 % IVPB        670 mg 226.8 mL/hr over 30 Minutes Intravenous Daily 11/07/20 0746     11/03/20 2200  metroNIDAZOLE (FLAGYL) tablet 500 mg        500 mg Oral Every 8 hours 11/03/20 1554     11/03/20 2000  sulfamethoxazole-trimethoprim (BACTRIM DS) 800-160 MG per tablet 1 tablet  Status:  Discontinued        1 tablet Oral Every 12 hours 11/03/20 1554 11/07/20 0742   11/01/20 0915  cefTRIAXone (ROCEPHIN) 2 g in sodium chloride 0.9 % 100 mL IVPB  Status:  Discontinued        2 g 200 mL/hr over 30 Minutes Intravenous Every 24 hours 11/01/20 0816 11/03/20 1551   10/29/20 1400  vancomycin (VANCOREADY) IVPB 750 mg/150 mL  Status:  Discontinued        750 mg 150 mL/hr over 60 Minutes Intravenous Every 12  hours 10/29/20 1238 11/01/20 0816   10/28/20 1400  vancomycin (VANCOCIN) IVPB 1000 mg/200 mL premix  Status:  Discontinued        1,000 mg 200 mL/hr over 60 Minutes Intravenous Every 12 hours 10/28/20 1344 10/29/20 1238   10/28/20 0900  metroNIDAZOLE (FLAGYL) IVPB 500 mg  Status:  Discontinued        500 mg 100 mL/hr over 60 Minutes Intravenous Every 8 hours 10/28/20 0808 11/03/20 1551   10/26/20 1000  vancomycin (VANCOCIN) IVPB 1000 mg/200 mL premix  Status:  Discontinued        1,000 mg 200 mL/hr over 60 Minutes Intravenous Every 12 hours 10/25/20 2037 10/28/20 1344   10/25/20 2200  ceFEPIme (MAXIPIME) 2 g in sodium chloride 0.9 % 100 mL IVPB  Status:  Discontinued        2 g 200 mL/hr over 30 Minutes Intravenous Every 8 hours 10/25/20 2037 11/01/20 0816   10/25/20 2030  vancomycin (VANCOREADY) IVPB 2000 mg/400 mL        2,000 mg 200 mL/hr over 120 Minutes Intravenous NOW 10/25/20 2027 10/25/20 2248   10/25/20 1930  vancomycin (VANCOCIN) IVPB 1000 mg/200 mL premix  Status:  Discontinued        1,000 mg 200 mL/hr over 60 Minutes Intravenous  Once 10/25/20 1928 10/25/20 2136   10/25/20 1930  cefTRIAXone (ROCEPHIN) 2 g in sodium chloride 0.9 % 100 mL IVPB        2 g 200 mL/hr over 30 Minutes Intravenous  Once 10/25/20 1928 10/25/20 2040     Objective: Vitals: Today's Vitals   11/12/20 0500 11/12/20 0600 11/12/20 0700 11/12/20 0720  BP:  114/69 108/61   Pulse: (!) 108 (!) 109 (!) 105   Resp:  18 17   Temp: 98.3 F (36.8 C) 98.3 F (36.8 C) 98.1 F (36.7 C)   TempSrc: Oral Oral Oral   SpO2:  98% 94%   Weight:      Height:      PainSc:    Asleep    Intake/Output Summary (Last 24 hours) at 11/12/2020 0732 Last data filed at 11/11/2020 1101 Gross per 24 hour  Intake 500 ml  Output 100 ml  Net 400 ml   Filed Weights   10/25/20 1824 10/26/20 1850 10/27/20 0500  Weight: 116.1 kg 110.5 kg 110.3 kg   Weight change:   Intake/Output from previous day: 01/31 0701 - 02/01  0700 In:  500 [I.V.:250; IV Piggyback:250] Out: 100 [Blood:100] Intake/Output this shift: No intake/output data recorded. Filed Weights   10/25/20 1824 10/26/20 1850 10/27/20 0500  Weight: 116.1 kg 110.5 kg 110.3 kg    Examination: General exam: Obese, old for age, lethargic  HEENT:Oral mucosa moist, Ear/Nose WNL grossly, dentition normal. Respiratory system: bilaterally diminished,no wheezing or crackles,no use of accessory muscle Cardiovascular system: S1 & S2 +, No JVD,. Gastrointestinal system: Abdomen soft, nontender,ND, BS+ Nervous System: Lethargic, able to withdraw move his right leg from upper extremities, able to squeeze with bilateral hand. Extremities: No edema, left AKA w/ dressing intact. Skin: No rashes,no icterus. MSK: Normal muscle bulk,tone, power  Data Reviewed: I have personally reviewed following labs and imaging studies CBC: Recent Labs  Lab 11/08/20 0500 11/09/20 0550 11/10/20 0650 11/11/20 0430 11/12/20 0400  WBC 14.7* 13.7* 13.5* 14.5* 14.4*  HGB 10.6* 10.4* 10.8* 10.2* 10.0*  HCT 33.9* 32.3* 34.4* 31.3* 31.9*  MCV 98.0 96.7 97.2 96.6 98.2  PLT 269 273 277 244 [id number]   Basic Metabolic Panel: Recent Labs  Lab 11/08/20 0500 11/09/20 0858 11/10/20 0650 11/11/20 0430 11/12/20 0400  NA 140 138 137 138 136  K 3.8 4.2 4.1 3.8 4.2  CL 107 106 106 106 103  CO2 23 24 24 26 25   GLUCOSE 147* 142* 144* 139* 223*  BUN 12 12 10 9 8   CREATININE 1.02 0.92 0.85 0.86 0.90  CALCIUM 7.8* 7.8* 8.1* 8.0* 8.0*   GFR: Estimated Creatinine Clearance: 93.1 mL/min (by C-G formula based on SCr of 0.9 mg/dL). Liver Function Tests: No results for input(s): AST, ALT, ALKPHOS, BILITOT, PROT, ALBUMIN in the last 168 hours. No results for input(s): LIPASE, AMYLASE in the last 168 hours. No results for input(s): AMMONIA in the last 168 hours. Coagulation Profile: Recent Labs  Lab 11/11/20 0430  INR 1.2   Cardiac Enzymes: Recent Labs  Lab 11/07/20 0500  CKTOTAL  157   BNP (last 3 results) No results for input(s): PROBNP in the last 8760 hours. HbA1C: No results for input(s): HGBA1C in the last 72 hours. CBG: Recent Labs  Lab 11/10/20 2105 11/11/20 0903 11/11/20 1118 11/11/20 1627 11/11/20 2122  GLUCAP 286* 167* 147* 236* 197*   Lipid Profile: No results for input(s): CHOL, HDL, LDLCALC, TRIG, CHOLHDL, LDLDIRECT in the last 72 hours. Thyroid Function Tests: No results for input(s): TSH, T4TOTAL, FREET4, T3FREE, THYROIDAB in the last 72 hours. Anemia Panel: No results for input(s): VITAMINB12, FOLATE, FERRITIN, TIBC, IRON, RETICCTPCT in the last 72 hours. Sepsis Labs: No results for input(s): PROCALCITON, LATICACIDVEN in the last 168 hours.  Recent Results (from the past 240 hour(s))  SARS Coronavirus 2 by RT PCR (hospital order, performed in [facility] hospital lab) Nasopharyngeal Nasopharyngeal Swab     Status: None   Collection Time: 11/10/20  1:04 PM   Specimen: Nasopharyngeal Swab  Result Value Ref Range Status   SARS Coronavirus 2 NEGATIVE NEGATIVE Final    Comment: (NOTE) SARS-CoV-2 target nucleic acids are NOT DETECTED.  The SARS-CoV-2 RNA is generally detectable in upper and lower respiratory specimens during the acute phase of infection. The lowest concentration of SARS-CoV-2 viral copies this assay can detect is 250 copies / mL. A negative result does not preclude SARS-CoV-2 infection and should not be used as the sole basis for treatment or other patient management decisions.  A negative result may occur with improper specimen collection / handling, submission of specimen other than nasopharyngeal swab, presence of viral mutation(s) within  the areas targeted by this assay, and inadequate number of viral copies (<250 copies / mL). A negative result must be combined with clinical observations, patient history, and epidemiological information.  Fact Sheet for Patients:   StrictlyIdeas.no  Fact  Sheet for Healthcare Providers: BankingDealers.co.za  This test is not yet approved or  cleared by the [place] FDA and has been authorized for detection and/or diagnosis of SARS-CoV-2 by FDA under an Emergency Use Authorization (EUA).  This EUA will remain in effect (meaning this test can be used) for the duration of the COVID-19 declaration under Section 564(b)(1) of the Act, 21 U.S.C. section 360bbb-3(b)(1), unless the authorization is terminated or revoked sooner.  Performed at [facility], [place] [address], [place], [place] [address]      Radiology Studies: No results found.   LOS: 18 days   Antonieta Pert, MD [facility]  11/12/2020, 7:32 AM

## 2020-11-12 NOTE — TOC Progression Note (Signed)
Transition of Care [facility]) - Progression Note    Patient Details  Name: [name] MRN: [id number] Date of Birth: 1951-01-15  Transition of Care [facility]) CM/SW [place], [place] Phone Number: 11/12/2020, 3:04 PM  Clinical Narrative:     CSW reviewed chart for SNF bed offers in the [place] area- CSW requested [facility] and [facility] to review for possible admission  CSW called patient spouse- she states [facility] too far and was not interested in [facility]  [place] will continue to follow and assist with discharge planning.  Thurmond Butts, MSW, LCSW Clinical Social Worker    Expected Discharge Plan: Skilled Nursing Facility Barriers to Discharge: Continued Medical Work up  Expected Discharge Plan and Services Expected Discharge Plan: [place] In-house Referral: Clinical Social Work   Post Acute Care Choice: Hospice Living arrangements for the past 2 months: Single Family Home                                       Social Determinants of Health (SDOH) Interventions    Readmission Risk Interventions No flowsheet data found.

## 2020-11-12 NOTE — Progress Notes (Addendum)
VASCULAR SURGERY ASSESSMENT & PLAN:   1 Day Post-Op [name] is a 70 y.o. male non-salvageable LLE ischemic ulceration / gangrene.   L AKA yesterday. Febrile overnight. No significant change in leukocytosis. H and H stable. Multiple antimicrobials on board. Will take down dressing tomorrow.   SUBJECTIVE:   Lethargic. Opens eyes to name.  PHYSICAL EXAM:   Vitals:   11/12/20 0400 11/12/20 0500 11/12/20 0600 11/12/20 0700  BP: 96/68  114/69 108/61  Pulse: 86 (!) 108 (!) 109 (!) 105  Resp: 17  18 17   Temp: 98.8 F (37.1 C) 98.3 F (36.8 C) 98.3 F (36.8 C) 98.1 F (36.7 C)  TempSrc: Oral Oral Oral Oral  SpO2: 95%  98% 94%  Weight:      Height:       General appearance: Lethargic; in no apparent distress Cardiac: Heart rate slightly elevated; rhythm are regular Respirations: Nonlabored; Normal SaO2 on 1 L O2 via [place] Extremities: Amputation site with surgical dressing in place. No strike through  LABS:   Lab Results  Component Value Date   WBC 14.4 (H) 11/12/2020   HGB 10.0 (L) 11/12/2020   HCT 31.9 (L) 11/12/2020   MCV 98.2 11/12/2020   PLT 245 11/12/2020   Lab Results  Component Value Date   CREATININE 0.90 11/12/2020   Lab Results  Component Value Date   INR 1.2 11/11/2020   CBG (last 3)  Recent Labs    11/11/20 1118 11/11/20 1627 11/11/20 2122  GLUCAP 147* 236* 197*    PROBLEM LIST:    Principal Problem:   Sepsis --due to Infected Lt Foot/Ostepmyleitis---POA Active Problems:   Atrial flutter (HCC)   Mixed hyperlipidemia   CAD, multiple vessel, 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   Essential hypertension   GERD (gastroesophageal reflux disease)   BPH (benign prostatic hyperplasia)   Acute osteomyelitis of toe, left (HCC)   Diabetic ulcer of left heel (HCC)   Fracture of left toe   Leukocytosis   Hypokalemia   Hypoalbuminemia   Hyperglycemia due to diabetes mellitus ([place])   History of stroke   PAD (peripheral artery disease) /SMA Stenosis/Rt and Lt  SFA Stenosis/   Osteomyelitis of left foot ([place])   Cardiomyopathy ([place])   Palliative care by specialist   Goals of care, counseling/discussion   CURRENT MEDS:   . amiodarone  200 mg Oral Daily  . atorvastatin  40 mg Oral Daily  . baclofen  5 mg Oral TID  . Chlorhexidine Gluconate Cloth  6 each Topical Daily  . cholecalciferol  5,000 Units Oral Q1200  . clopidogrel  75 mg Oral Daily  . donepezil  5 mg Oral QHS  . feeding supplement (GLUCERNA SHAKE)  237 mL Oral TID BM  . ferrous sulfate  325 mg Oral BID WC  . finasteride  5 mg Oral Q1200  . furosemide  40 mg Oral Daily  . gabapentin  600 mg Oral TID  . Gerhardt's butt cream   Topical QID  . insulin aspart  0-15 Units Subcutaneous TID WC  . insulin aspart  0-5 Units Subcutaneous QHS  . insulin glargine  10 Units Subcutaneous QHS  . loratadine  5 mg Oral QHS  . memantine  5 mg Oral BID  . metoprolol succinate  25 mg Oral Daily  . metroNIDAZOLE  500 mg Oral Q8H  . pantoprazole  40 mg Oral Daily  . potassium chloride  20 mEq Oral Daily  . sodium chloride flush  10-40  mL Intracatheter Q12H    Barbie Banner, PA-C Office: [phone] 11/12/2020   VASCULAR STAFF ADDENDUM: I have independently interviewed and examined the patient. I agree with the above.  Fever overnight - will monitor. Likely atelectasis. Will take dressing down tomorrow.  Yevonne Aline. Stanford Breed, MD [facility] Phone Number: [phone] 11/12/2020 7:39 AM

## 2020-11-12 NOTE — Progress Notes (Signed)
This chaplain responded to chaplain's referral for revisit when the Pt. [name] is present. The chaplain understands the visit is for Pt. Advance Directive education.    The chaplain understands from the Pt. RN-Kim the Pt. wife-Wanda will not be visiting today. The chaplain will attempt to F/U on AD education with [name] on Wednesday.

## 2020-11-12 NOTE — Progress Notes (Signed)
Chaplain responded to Spiritual Care consult for pt and wife who needed help filling out HCPOA. Chaplain greeted pt.  Wife not present.  Nurse spoke quietly as an aside to [name] explaining pt and wife will likely have difficulty understanding document, advising to [id number] explain it as simply as possible hoping they gain an overall understanding of the HCPOA.  Chaplain referring to follow-up Chaplain to re-visit when pt's wife arrives 1-1:30 today.  [name]

## 2020-11-12 NOTE — Progress Notes (Addendum)
Temperature at midnight VS check was 101.2, PRN tylenol and percocet given, after 1 hour temperature was 100.4.  [facility] on call was paged at [place], no new orders received as of 0130.  0200- temperature checked again and was 102.4 orally, 2nd page sent to on call provider at [place].  Orders received for 1 dose of 15mg  toradol, given at 0231.  Pt MEWS score turned red, will check vital signs every hour. Patient noted to be more lethargic than at beginning of shift and when medications were given at about 2200.  0300- VS rechecked, temperature down to 98.2 orally.  Pt still lethargic but will respond to voice, will continue to monitor patient condition closely  0600- pt consistently afebrile since 0300, more alert this AM, given pain medication

## 2020-11-12 NOTE — Progress Notes (Signed)
Progress Note  Patient Name: [name] Date of Encounter: 11/12/2020  [facility] Cardiologist: Kirk Ruths, MD   Subjective   Confused; denies CP  Inpatient Medications    Scheduled Meds: . amiodarone  200 mg Oral Daily  . atorvastatin  40 mg Oral Daily  . baclofen  5 mg Oral TID  . Chlorhexidine Gluconate Cloth  6 each Topical Daily  . cholecalciferol  5,000 Units Oral Q1200  . clopidogrel  75 mg Oral Daily  . donepezil  5 mg Oral QHS  . feeding supplement (GLUCERNA SHAKE)  237 mL Oral TID BM  . ferrous sulfate  325 mg Oral BID WC  . finasteride  5 mg Oral Q1200  . furosemide  40 mg Oral Daily  . gabapentin  600 mg Oral TID  . Gerhardt's butt cream   Topical QID  . insulin aspart  0-15 Units Subcutaneous TID WC  . insulin aspart  0-5 Units Subcutaneous QHS  . insulin glargine  10 Units Subcutaneous QHS  . loratadine  5 mg Oral QHS  . memantine  5 mg Oral BID  . metoprolol succinate  25 mg Oral Daily  . metroNIDAZOLE  500 mg Oral Q8H  . pantoprazole  40 mg Oral Daily  . potassium chloride  20 mEq Oral Daily  . sodium chloride flush  10-40 mL Intracatheter Q12H   Continuous Infusions: . cefTRIAXone (ROCEPHIN)  IV 2 g (11/10/20 0932)  . DAPTOmycin (CUBICIN)  IV 670 mg (11/11/20 2026)   PRN Meds: acetaminophen, haloperidol lactate, levalbuterol, ondansetron (ZOFRAN) IV, oxyCODONE-acetaminophen, sodium chloride flush   Vital Signs    Vitals:   11/12/20 0500 11/12/20 0600 11/12/20 0700 11/12/20 0751  BP:  114/69 108/61 102/80  Pulse: (!) 108 (!) 109 (!) 105 94  Resp:  18 17 17   Temp: 98.3 F (36.8 C) 98.3 F (36.8 C) 98.1 F (36.7 C) 98.5 F (36.9 C)  TempSrc: Oral Oral Oral Axillary  SpO2:  98% 94% 94%  Weight:      Height:        Intake/Output Summary (Last 24 hours) at 11/12/2020 0904 Last data filed at 11/11/2020 1101 Gross per 24 hour  Intake 500 ml  Output 100 ml  Net 400 ml   Last 3 Weights 10/27/2020 10/26/2020 10/25/2020  Weight (lbs)  243 lb 2.7 oz 243 lb 9.7 oz 256 lb  Weight (kg) 110.3 kg 110.5 kg 116.121 kg      Telemetry    Atrial flutter - Personally Reviewed  Physical Exam   GEN: No acute distress.   Neck: No JVD Cardiac: Mildly tachycardic  Respiratory: Clear to auscultation bilaterally. GI: Soft, nontender, non-distended  MS: S/P AKA Neuro:  Nonfocal  Psych: Normal affect   Labs    High Sensitivity Troponin:   Recent Labs  Lab 11/05/20 2219 11/06/20 0013  TROPONINIHS 43* 42*      Chemistry Recent Labs  Lab 11/10/20 0650 11/11/20 0430 11/12/20 0400  NA 137 138 136  K 4.1 3.8 4.2  CL 106 106 103  CO2 24 26 25   GLUCOSE 144* 139* 223*  BUN 10 9 8   CREATININE 0.85 0.86 0.90  CALCIUM 8.1* 8.0* 8.0*  GFRNONAA >60 >60 >60  ANIONGAP 7 6 8      Hematology Recent Labs  Lab 11/10/20 0650 11/11/20 0430 11/12/20 0400  WBC 13.5* 14.5* 14.4*  RBC 3.54* 3.24* 3.25*  HGB 10.8* 10.2* 10.0*  HCT 34.4* 31.3* 31.9*  MCV 97.2 96.6 98.2  MCH 30.5 31.5 30.8  MCHC 31.4 32.6 31.3  RDW 18.5* 18.7* 18.6*  PLT 277 244 245    Radiology    DG Chest Port 1 View  Result Date: 11/12/2020 CLINICAL DATA:  Fever.  Sepsis. EXAM: PORTABLE CHEST 1 VIEW COMPARISON:  10/29/2020.  10/26/2020. FINDINGS: PICC line noted stable position. Prior CABG. Cardiomegaly. Bilateral interstitial infiltrates/edema again noted with interim improvement from prior exam. Tiny bilateral pleural effusions cannot be excluded. Right costophrenic angle incompletely imaged. No pneumothorax. Deformity again noted of the proximal left humerus. Carotid and peripheral vascular calcification noted. IMPRESSION: 1. PICC line noted in stable position. 2. Prior CABG. Cardiomegaly. Bilateral interstitial infiltrates/edema again noted with interim improvement from prior exam. Tiny bilateral pleural effusions cannot be excluded. 3. Carotid and peripheral vascular disease. Electronically Signed   By: Marcello Moores  Register   On: 11/12/2020 08:28    Patient  Profile     70 y.o. male with a PMH of CAD s/p CABG in 2014, chronic combined CHF, paroxysmal atrial flutter, carotid artery disease with known right sided occlusion and moderate left stenosis, PVD, HTN, HLD, DM type 2, and CVA with left sided hemiparesis,whowas initially seen for CHF and atrial flutter.  Echocardiogram this admission shows ejection fraction 25 to 30%, mild left ventricular enlargement, mild left ventricular hypertrophy, mild left atrial enlargement, mild mitral regurgitation.  EF worse compared to previous.  Assessment & Plan    1 chronic combined systolic/diastolic congestive heart failure-patient appears to be doing well from a volume standpoint.  Continue Lasix at present dose.  2 atrial flutter-he remains in atrial flutter this morning.  Rate is upper normal to mildly elevated.  This is possibly secondary to the hyperadrenergic state associated with recent surgery and also fever.  Continue amiodarone and Toprol.  Resume apixaban when okay with vascular surgery.  3 ischemic cardiomyopathy-LV function is worse compared to previous.  I do not think he is a good candidate for aggressive ischemia evaluation at this point.  We will continue medical therapy.  Continue Toprol at present dose.  Follow blood pressure and add low-dose ARB or Entresto as tolerated.  4 sepsis-status post AKA.  Continue antibiotics per primary care.  5 coronary artery disease-continue Plavix and statin.  When apixaban resumed would discontinue Plavix.  For questions or updates, please contact [place] Please consult [email or web address] for contact info under        Signed, Kirk Ruths, MD  11/12/2020, 9:04 AM

## 2020-11-13 ENCOUNTER — Inpatient Hospital Stay (HOSPITAL_COMMUNITY): Payer: Medicare Other

## 2020-11-13 DIAGNOSIS — M7989 Other specified soft tissue disorders: Secondary | ICD-10-CM

## 2020-11-13 DIAGNOSIS — I10 Essential (primary) hypertension: Secondary | ICD-10-CM | POA: Diagnosis not present

## 2020-11-13 LAB — CBC
HCT: 28.2 % — ABNORMAL LOW (ref 39.0–52.0)
Hemoglobin: 8.7 g/dL — ABNORMAL LOW (ref 13.0–17.0)
MCH: 30.6 pg (ref 26.0–34.0)
MCHC: 30.9 g/dL (ref 30.0–36.0)
MCV: 99.3 fL (ref 80.0–100.0)
Platelets: 183 10*3/uL (ref 150–400)
RBC: 2.84 MIL/uL — ABNORMAL LOW (ref 4.22–5.81)
RDW: 18.6 % — ABNORMAL HIGH (ref 11.5–15.5)
WBC: 11 10*3/uL — ABNORMAL HIGH (ref 4.0–10.5)
nRBC: 0 % (ref 0.0–0.2)

## 2020-11-13 LAB — BASIC METABOLIC PANEL
Anion gap: 6 (ref 5–15)
BUN: 11 mg/dL (ref 8–23)
CO2: 25 mmol/L (ref 22–32)
Calcium: 7.7 mg/dL — ABNORMAL LOW (ref 8.9–10.3)
Chloride: 104 mmol/L (ref 98–111)
Creatinine, Ser: 0.79 mg/dL (ref 0.61–1.24)
GFR, Estimated: >60 mL/min (ref >60)
Glucose, Bld: 146 mg/dL — ABNORMAL HIGH (ref 70–99)
Potassium: 3.7 mmol/L (ref 3.5–5.1)
Sodium: 135 mmol/L (ref 135–145)

## 2020-11-13 LAB — GLUCOSE, CAPILLARY
Glucose-Capillary: 107 mg/dL — ABNORMAL HIGH (ref 70–99)
Glucose-Capillary: 141 mg/dL — ABNORMAL HIGH (ref 70–99)
Glucose-Capillary: 200 mg/dL — ABNORMAL HIGH (ref 70–99)
Glucose-Capillary: 259 mg/dL — ABNORMAL HIGH (ref 70–99)

## 2020-11-13 MED ORDER — KETOROLAC TROMETHAMINE 15 MG/ML IJ SOLN
7.5000 mg | Freq: Four times a day (QID) | INTRAMUSCULAR | Status: DC | PRN
  Administered 2020-11-13 – 2020-11-15 (×4): 7.5 mg via INTRAVENOUS
  Filled 2020-11-13 (×4): qty 1

## 2020-11-13 NOTE — Progress Notes (Addendum)
This chaplain understands after talking to the Pt. RN-Angela the Pt. wife-Wanda's anticipated arrival is 4pm.  The chaplain will attempt F/U conversation about the Pt. HCPOA at that time.  **[id number]  The chaplain phoned the unit.  The chaplain understands [name] has not arrived on the unit at this time.

## 2020-11-13 NOTE — TOC Progression Note (Signed)
Transition of Care [employer]) - Progression Note    Patient Details  Name: [name] MRN: [id number] Date of Birth: [birth year]  Transition of Care [facility]) CM/SW [place], [place] Phone Number: 11/13/2020, 11:28 AM  Clinical Narrative:     F[facility] [place]/Patricia - still reviewing , has a few questions for patient's wife but has not been able to speak to her by phone. SNF reports unable to access patient's clinicals at this time- CSW requested to follow up once a decision has been made.   CSW will continue to follow and assist with discharge planning.   Thurmond Butts, MSW, LCSW Clinical Social Worker   Expected Discharge Plan: Skilled Nursing Facility Barriers to Discharge: Continued Medical Work up  Expected Discharge Plan and Services Expected Discharge Plan: [place] In-house Referral: Clinical Social Work   Post Acute Care Choice: Hospice Living arrangements for the past 2 months: Single Family Home                                       Social Determinants of Health (SDOH) Interventions    Readmission Risk Interventions No flowsheet data found.

## 2020-11-13 NOTE — Progress Notes (Signed)
Orthopedic Tech Progress Note Patient Details:  [name] [birth year] [id number] Called in order to [facility] for an AKA RETENTION SOCK Patient ID: [name], male   DOB: [birth year], 70 y.o.   MRN: [id number]   Janit Pagan 11/13/2020, 8:27 AM

## 2020-11-13 NOTE — Progress Notes (Signed)
Pt has not voided since 1130 am I/O cath. Bladder scanned for 252cc,pt feels no urge to void. Continue to monitor. Ellamae Sia

## 2020-11-13 NOTE — Progress Notes (Signed)
PROGRESS NOTE    [name]  [id number] DOB: 1951/01/06 DOA: 10/25/2020 PCP: Leeanne Rio, MD   Chief Complaint  Patient presents with  . Foot Pain  . Foot Ulcer  Brief Narrative: 70 year old male with hypertension HLD, T2DM, a flutter, CAD, history of a stroke with left hemiparesis admitted with left foot wound from recent trauma.   Pt was admitted on antibiotics at [facility], vascular surgery was consulted and transferred to [facility] 1/17-but patient refused amputation. Patient had complicated hospitalization with acute hypoxic respiratory failure acute on chronic systolic CHF.  Seen by cardiology and vascular.  Palliative care was consulted since he initially refused amputation. After family meeting with palliative care 11/04/20 patient and wife wanted to proceed with amputation-vascular surgery and cardiology were notified for preop ( due to new low lvef). But family decided against surgery on 1/26 eventually on 1/29 family reverted to full code and proceeded with lt AKA 1/31 -understanding he is at  [place] 2/1 Patient has been very lethargic/episode of fever underwent chest x-ray unremarkable, UA and blood culture.  Subjective:  T-max 100.1 overnight.  Weaned off oxygen the rest of the vitals stable, leukocytosis improving. Having issue with PICC line His pain medication not lasting too long. He appears much more alert awake but oriented x2, thinks he is still lat [place] ( his baseline). Appears pleasant.  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 Linus Salmons  today and okay to discontinue all antibiotics.Continue postop care PT OT as per vascular.  Having issue with pain control but patient is very lethargic with opiates yesterday morning, adding low dose iv Toradol, cont oxy as tolerated. Recent Labs  Lab  11/09/20 0550 11/10/20 0650 11/11/20 0430 11/12/20 0400 11/13/20 0553  WBC 13.7* 13.5* 14.5* 14.4* 11.0*   Febrile episode overnight 1/31-2/1:chest x-ray-no obvious pneumonia/UA on those noted positive but leuk negative and WBC 0-5, less likely UTI, blood culture pending.Having issues w/ PICC despite altepase ( picc placed on 10/27/20) per RN- okay to remove PICC line, but will do Duplex of arm- as he also had fever, to r/o DVT.  Acute metabolic encephalopathy with lethargy/somnolence:multi-factorial- from post op/fever/pain medications/polypharmacy.  No hypercapnia on VBG.  Minimize opiates/oxycodone, decreased Neurontin  To half dose. Pain control w/ nsaid/toradol, tylenol.  He appears much more alert awake close to his baseline.  Acute blood loss anemia hemoglobin dropped to 8.7 g, monitor.  Suspect multifactorial blood loss anemia in the setting of surgery , limb loss and chronic disease. Recent Labs  Lab 11/09/20 0550 11/10/20 0650 11/11/20 0430 11/12/20 0400 11/13/20 0553  HGB 10.4* 10.8* 10.2* 10.0* 8.7*  HCT 32.3* 34.4* 31.3* 31.9* 28.2*   Chronic combined systolic/diastolic [id number] with EF 25 to 30%-which is new, previously 50% 2019, seen by cardiology-on Toprol and Lasix,no plan for invasive work-up per cards.  No evidence of fluid overload.Monitor weight watch for fluid overload.Net IO Since Admission: -4,131.34 mL [11/13/20 0802]  Ischemic cardiomyopathy/CAD-multivessel with history of CABG  12/2013/ hyperlipidemia/mildly elevated troponin subtle elevation flat trend nonischemic: Stable, continue Lipitor, Plavix, metoprolol. No further clinical work-up recommended.  PAF/a flutter with RVR : Rate fairly controlled on Toprol, amiodarone.therapeutic Lovenox held preoperatively.Patient will need long-term DOAC given high chads 2 vasc score.  Sent message to  Vascular PA Katharine Look- when can we can we resume Eliquis ?Marland Kitchen  Acute hypoxic respiratory failure: post  opresolved.  Insulin-dependent type 2 diabetes mellitus, stable HbA1c 6.9.  Stable, cont  Lantus, ssi .Monitor CBG. Recent Labs  Lab 11/12/20 0749 11/12/20 1125 11/12/20 1547 11/12/20 2139 11/13/20 0750  GLUCAP 191* 223* 131* 269* 107*   Hypokalemia/magnesium:stable on daily potassium supplement.   History of CVA with left hemiparesis/PAD/HTN: Cont his statin/Plavix/Toprol.BP stable-was soft 2/1  CKD stage II,at baseline.  Dementia:  Cont on Aricept/Namenda, minmize edatives, cont supportive care fall precaution.  GERD:Continue PPI  [id number] finasteride  Morbid obesity with body mass index is 38.09 kg/m.PCP follow-up for outpatient weight loss.   Bleeding in the perineal area wound type MASD, contact dermatitis from fecal incontinence.  Much improved followed by wound care.  [id number] care has been following very closely.After extensive family  W/ PMT 1/29 family wanted to proceed with amputation. And was changed to full code. Continue to monitor closely high risk of decompensation and readmission. Palliative care to follow along closely.  Nutrition: Diet Order            Diet Carb Modified Fluid consistency: Thin; Room service appropriate? Yes  Diet effective now                DVT prophylaxis: SCDs Start: 10/25/20 2246 Code Status:   Code Status: Full Code  Family Communication: plan of care discussed with patient at bedside. Discussed nursing staff. Palliative care to follow along.  Status is: Inpatient Remains inpatient appropriate because:  of ongoing management of osteomyelitis now status post amputation 1/31  Dispo: The patient is from:Home              Anticipated d/c is [id number] SNF.              Anticipated d/c date is:1-2 days              Patient currently is not medically stable to d/c.   Difficult to place patient Yes  Consultants:see note  Procedures:see note  Culture/Microbiology    Component Value Date/Time   SDES  10/25/2020  2210    IN/OUT CATH URINE Performed at [facility], [address] [place], [place] [address]    [facility]  10/25/2020 2210    NONE Performed at [facility], [address], [place], [place] [address]    CULT 40,000 COLONIES/mL ENTEROBACTER AEROGENES (A) 10/25/2020 2210   REPTSTATUS 10/28/2020 FINAL 10/25/2020 2210    Other culture-see note  Medications: Scheduled Meds: . amiodarone  200 mg Oral Daily  . atorvastatin  40 mg Oral Daily  . baclofen  5 mg Oral TID  . Chlorhexidine Gluconate Cloth  6 each Topical Daily  . cholecalciferol  5,000 Units Oral Q1200  . clopidogrel  75 mg Oral Daily  . donepezil  5 mg Oral QHS  . feeding supplement (GLUCERNA SHAKE)  237 mL Oral TID BM  . ferrous sulfate  325 mg Oral BID WC  . finasteride  5 mg Oral Q1200  . furosemide  40 mg Oral Daily  . gabapentin  300 mg Oral TID  . Gerhardt's butt cream   Topical QID  . insulin aspart  0-15 Units Subcutaneous TID WC  . insulin aspart  0-5 Units Subcutaneous QHS  . insulin glargine  10 Units Subcutaneous QHS  . loratadine  5 mg Oral QHS  . memantine  5 mg Oral BID  . metoprolol succinate  25 mg Oral Daily  . metroNIDAZOLE  500 mg Oral Q8H  . pantoprazole  40 mg Oral Daily  . potassium chloride  20 mEq Oral Daily  . sodium chloride flush  10-40 mL Intracatheter Q12H   Continuous Infusions: . cefTRIAXone (ROCEPHIN)  IV 2 g (11/12/20 1150)  . DAPTOmycin (CUBICIN)  IV 670 mg (11/12/20 2319)    Antimicrobials: Anti-infectives (From admission, onward)   Start     Dose/Rate Route Frequency Ordered Stop   11/07/20 1000  cefTRIAXone (ROCEPHIN) 2 g in sodium chloride 0.9 % 100 mL IVPB        2 g 200 mL/hr over 30 Minutes Intravenous Every 24 hours 11/07/20 0742     11/07/20 1000  DAPTOmycin (CUBICIN) 670 mg in sodium chloride 0.9 % IVPB        670 mg 226.8 mL/hr over 30 Minutes Intravenous Daily 11/07/20 0746     11/03/20 2200  metroNIDAZOLE (FLAGYL) tablet 500 mg        500 mg Oral Every 8  hours 11/03/20 1554     11/03/20 2000  sulfamethoxazole-trimethoprim (BACTRIM DS) 800-160 MG per tablet 1 tablet  Status:  Discontinued        1 tablet Oral Every 12 hours 11/03/20 1554 11/07/20 0742   11/01/20 0915  cefTRIAXone (ROCEPHIN) 2 g in sodium chloride 0.9 % 100 mL IVPB  Status:  Discontinued        2 g 200 mL/hr over 30 Minutes Intravenous Every 24 hours 11/01/20 0816 11/03/20 1551   10/29/20 1400  vancomycin (VANCOREADY) IVPB 750 mg/150 mL  Status:  Discontinued        750 mg 150 mL/hr over 60 Minutes Intravenous Every 12 hours 10/29/20 1238 11/01/20 0816   10/28/20 1400  vancomycin (VANCOCIN) IVPB 1000 mg/200 mL premix  Status:  Discontinued        1,000 mg 200 mL/hr over 60 Minutes Intravenous Every 12 hours 10/28/20 1344 10/29/20 1238   10/28/20 0900  metroNIDAZOLE (FLAGYL) IVPB 500 mg  Status:  Discontinued        500 mg 100 mL/hr over 60 Minutes Intravenous Every 8 hours 10/28/20 0808 11/03/20 1551   10/26/20 1000  vancomycin (VANCOCIN) IVPB 1000 mg/200 mL premix  Status:  Discontinued        1,000 mg 200 mL/hr over 60 Minutes Intravenous Every 12 hours 10/25/20 2037 10/28/20 1344   10/25/20 2200  ceFEPIme (MAXIPIME) 2 g in sodium chloride 0.9 % 100 mL IVPB  Status:  Discontinued        2 g 200 mL/hr over 30 Minutes Intravenous Every 8 hours 10/25/20 2037 11/01/20 0816   10/25/20 2030  vancomycin (VANCOREADY) IVPB 2000 mg/400 mL        2,000 mg 200 mL/hr over 120 Minutes Intravenous NOW 10/25/20 2027 10/25/20 2248   10/25/20 1930  vancomycin (VANCOCIN) IVPB 1000 mg/200 mL premix  Status:  Discontinued        1,000 mg 200 mL/hr over 60 Minutes Intravenous  Once 10/25/20 1928 10/25/20 2136   10/25/20 1930  cefTRIAXone (ROCEPHIN) 2 g in sodium chloride 0.9 % 100 mL IVPB        2 g 200 mL/hr over 30 Minutes Intravenous  Once 10/25/20 1928 10/25/20 2040     Objective: Vitals: Today's Vitals   11/13/20 0337 11/13/20 0500 11/13/20 0630 11/13/20 0700  BP:    129/73   Pulse:    (!) 107  Resp:    16  Temp:    98.2 F (36.8 C)  TempSrc:    Oral  SpO2:  92%  Weight:      Height:      PainSc: Asleep 9  10-Worst pain ever     Intake/Output Summary (Last 24 hours) at 11/13/2020 0802 Last data filed at 11/13/2020 6440 Gross per 24 hour  Intake 886 ml  Output --  Net 886 ml   Filed Weights   10/25/20 1824 10/26/20 1850 10/27/20 0500  Weight: 116.1 kg 110.5 kg 110.3 kg   Weight change:   Intake/Output from previous day: 02/01 0701 - 02/02 0700 In: 886 [P.O.:786; IV Piggyback:100] Out: -  Intake/Output this shift: No intake/output data recorded. Filed Weights   10/25/20 1824 10/26/20 1850 10/27/20 0500  Weight: 116.1 kg 110.5 kg 110.3 kg    Examination: General exam: AAOx2- thinks he is at [facility], obese, pleasant. HEENT:Oral mucosa moist, Ear/Nose WNL grossly, dentition normal. Respiratory system: bilaterally clear,no wheezing or crackles,no use of accessory muscle Cardiovascular system: S1 & S2 +, No JVD,. Gastrointestinal system: Abdomen soft/obese, NT,ND, BS+ Nervous System:Alert, awake, moving  UE, Rt leg well. Extremities: No edema, distal peripheral pulses palpable.  Lt AKA - retention dressing being applied. Skin: No rashes,no icterus. MSK: Normal muscle bulk,tone, power picc RUE  Data Reviewed: I have personally reviewed following labs and imaging studies CBC: Recent Labs  Lab 11/09/20 0550 11/10/20 0650 11/11/20 0430 11/12/20 0400 11/13/20 0553  WBC 13.7* 13.5* 14.5* 14.4* 11.0*  HGB 10.4* 10.8* 10.2* 10.0* 8.7*  HCT 32.3* 34.4* 31.3* 31.9* 28.2*  MCV 96.7 97.2 96.6 98.2 99.3  PLT 273 277 244 245 347   Basic Metabolic Panel: Recent Labs  Lab 11/09/20 0858 11/10/20 0650 11/11/20 0430 11/12/20 0400 11/13/20 0553  NA 138 137 138 136 135  K 4.2 4.1 3.8 4.2 3.7  CL 106 106 106 103 104  CO2 24 24 26 25 25   GLUCOSE 142* 144* 139* 223* 146*  BUN 12 10 9 8 11   CREATININE 0.92 0.85 0.86 0.90 0.79  CALCIUM 7.8*  8.1* 8.0* 8.0* 7.7*   GFR: Estimated Creatinine Clearance: 104.8 mL/min (by C-G formula based on SCr of 0.79 mg/dL). Liver Function Tests: No results for input(s): AST, ALT, ALKPHOS, BILITOT, PROT, ALBUMIN in the last 168 hours. No results for input(s): LIPASE, AMYLASE in the last 168 hours. No results for input(s): AMMONIA in the last 168 hours. Coagulation Profile: Recent Labs  Lab 11/11/20 0430  INR 1.2   Cardiac Enzymes: Recent Labs  Lab 11/07/20 0500  CKTOTAL 157   BNP (last 3 results) No results for input(s): PROBNP in the last 8760 hours. HbA1C: No results for input(s): HGBA1C in the last 72 hours. CBG: Recent Labs  Lab 11/12/20 0749 11/12/20 1125 11/12/20 1547 11/12/20 2139 11/13/20 0750  GLUCAP 191* 223* 131* 269* 107*   Lipid Profile: No results for input(s): CHOL, HDL, LDLCALC, TRIG, CHOLHDL, LDLDIRECT in the last 72 hours. Thyroid Function Tests: No results for input(s): TSH, T4TOTAL, FREET4, T3FREE, THYROIDAB in the last 72 hours. Anemia Panel: No results for input(s): VITAMINB12, FOLATE, FERRITIN, TIBC, IRON, RETICCTPCT in the last 72 hours. Sepsis Labs: No results for input(s): PROCALCITON, LATICACIDVEN in the last 168 hours.  Recent Results (from the past 240 hour(s))  SARS Coronavirus 2 by RT PCR (hospital order, performed in [facility] hospital lab) Nasopharyngeal Nasopharyngeal Swab     Status: None   Collection Time: 11/10/20  1:04 PM   Specimen: Nasopharyngeal Swab  Result Value Ref Range Status   SARS Coronavirus 2 NEGATIVE NEGATIVE Final  Comment: (NOTE) SARS-CoV-2 target nucleic acids are NOT DETECTED.  The SARS-CoV-2 RNA is generally detectable in upper and lower respiratory specimens during the acute phase of infection. The lowest concentration of SARS-CoV-2 viral copies this assay can detect is 250 copies / mL. A negative result does not preclude SARS-CoV-2 infection and should not be used as the sole basis for treatment or  other patient management decisions.  A negative result may occur with improper specimen collection / handling, submission of specimen other than nasopharyngeal swab, presence of viral mutation(s) within the areas targeted by this assay, and inadequate number of viral copies (<250 copies / mL). A negative result must be combined with clinical observations, patient history, and epidemiological information.  Fact Sheet for Patients:   StrictlyIdeas.no  Fact Sheet for Healthcare Providers: BankingDealers.co.za  This test is not yet approved or  cleared by the [place] FDA and has been authorized for detection and/or diagnosis of SARS-CoV-2 by FDA under an Emergency Use Authorization (EUA).  This EUA will remain in effect (meaning this test can be used) for the duration of the COVID-19 declaration under Section 564(b)(1) of the Act, 21 U.S.C. section 360bbb-3(b)(1), unless the authorization is terminated or revoked sooner.  Performed at [facility], [address], [place], [place] [address]      Radiology Studies: DG Chest Port 1 View  Result Date: 11/12/2020 CLINICAL DATA:  Fever.  Sepsis. EXAM: PORTABLE CHEST 1 VIEW COMPARISON:  10/29/2020.  10/26/2020. FINDINGS: PICC line noted stable position. Prior CABG. Cardiomegaly. Bilateral interstitial infiltrates/edema again noted with interim improvement from prior exam. Tiny bilateral pleural effusions cannot be excluded. Right costophrenic angle incompletely imaged. No pneumothorax. Deformity again noted of the proximal left humerus. Carotid and peripheral vascular calcification noted. IMPRESSION: 1. PICC line noted in stable position. 2. Prior CABG. Cardiomegaly. Bilateral interstitial infiltrates/edema again noted with interim improvement from prior exam. Tiny bilateral pleural effusions cannot be excluded. 3. Carotid and peripheral vascular disease. Electronically Signed   By: Marcello Moores   Register   On: 11/12/2020 08:28     LOS: 26 days   Antonieta Pert, MD [facility]  11/13/2020, 8:02 AM

## 2020-11-13 NOTE — Progress Notes (Signed)
Upon hanging Daptomycin at 2000 this RN went to flush the patient's single lumen PICC line but the line was occluded completely.  IV consult was put in and IV team used alteplase to clear line.  The PICC was unable to be used until 2315 so this RN called the pharmacy to ensure the antibiotic could still be used even though it had been outside of the refrigerator.  Pharmacy confirmed that the antibiotic could still be used and changed the time of the medication to be due the next night at 2200.  Antibiotic given at 2319

## 2020-11-13 NOTE — Progress Notes (Addendum)
VASCULAR SURGERY ASSESSMENT & PLAN:   2 Days Post-Op [name] Judice Daltonis a 70 y.o.males/p left AKA fornon-salvageable LLE ischemic ulceration / gangrene. Residual limb incision well approximated and flaps are warm and well perfused.  I was only able to place dry gauze and tape. Will order retention sock.   Febrile yesterday at 0200>>Tm 100.1. Leukocytosis improved. Multiple antimicrobials on board.   Drift in H and H to 8.7>>follow. No evidence of post-op bleeding. Eliquis currently on hold  SUBJECTIVE:   Awake, alert. Left residual limb pain.  PHYSICAL EXAM:   Vitals:   11/13/20 0000 11/13/20 0100 11/13/20 0330 11/13/20 0700  BP: 119/72 137/85 116/67 129/73  Pulse: 100 98 (!) 104 (!) 107  Resp: 19 19 18 16   Temp: 100.1 F (37.8 C)  98.3 F (36.8 C) 98.2 F (36.8 C)  TempSrc: Oral  Oral Oral  SpO2: 95% 94% 97% 92%  Weight:      Height:       General appearance: Awake, alert in no apparent distress Cardiac: Heart rate and rhythm are regular Respirations: Nonlabored Extremities: Amputation site incision is well approximated without bleeding or hematoma.  Anterior and posterior flaps are warm and well-perfused  LABS:   Lab Results  Component Value Date   WBC 11.0 (H) 11/13/2020   HGB 8.7 (L) 11/13/2020   HCT 28.2 (L) 11/13/2020   MCV 99.3 11/13/2020   PLT 183 11/13/2020   Lab Results  Component Value Date   CREATININE 0.79 11/13/2020   Lab Results  Component Value Date   INR 1.2 11/11/2020   CBG (last 3)  Recent Labs    11/12/20 1547 11/12/20 2139 11/13/20 0750  GLUCAP 131* 269* 107*    PROBLEM LIST:    Principal Problem:   Sepsis --due to Infected Lt Foot/Ostepmyleitis---POA Active Problems:   Atrial flutter (HCC)   Mixed hyperlipidemia   CAD, multiple vessel, 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 amiodarone  200 mg Oral Daily  . atorvastatin  40 mg Oral Daily  . baclofen  5 mg Oral TID  . Chlorhexidine Gluconate Cloth  6 each Topical Daily  . cholecalciferol  5,000 Units Oral Q1200  . clopidogrel  75 mg Oral Daily  . donepezil  5 mg Oral QHS  . feeding supplement (GLUCERNA SHAKE)  237 mL Oral TID BM  . ferrous sulfate  325 mg Oral BID WC  . finasteride  5 mg Oral Q1200  . furosemide  40 mg Oral Daily  . gabapentin  300 mg Oral TID  . Gerhardt's butt cream   Topical QID  . insulin aspart  0-15 Units Subcutaneous TID WC  . insulin aspart  0-5 Units Subcutaneous QHS  . insulin glargine  10 Units Subcutaneous QHS  . loratadine  5 mg Oral QHS  . memantine  5 mg Oral BID  . metoprolol succinate  25 mg Oral Daily  .  metroNIDAZOLE  500 mg Oral Q8H  . pantoprazole  40 mg Oral Daily  . potassium chloride  20 mEq Oral Daily  . sodium chloride flush  10-40 mL Intracatheter Q12H    Barbie Banner, [place] Office: [phone] 11/13/2020   VASCULAR STAFF ADDENDUM: I have independently interviewed and examined the patient. I agree with the above.  Continue local care to stump. PT / OT / OOB. Needs disposition to SNF / Rehab to avoid falling on stump.  Yevonne Aline. Stanford Breed, MD [facility] Phone Number: [phone] 11/13/2020 5:27 PM

## 2020-11-13 NOTE — Progress Notes (Signed)
Upper extremity venous RT study completed.  Preliminary results relayed to Maren Beach, MD.   See CV Proc for preliminary results report.   Darlin Coco, RDMS

## 2020-11-14 DIAGNOSIS — R509 Fever, unspecified: Secondary | ICD-10-CM | POA: Diagnosis not present

## 2020-11-14 DIAGNOSIS — I10 Essential (primary) hypertension: Secondary | ICD-10-CM | POA: Diagnosis not present

## 2020-11-14 DIAGNOSIS — I4892 Unspecified atrial flutter: Secondary | ICD-10-CM | POA: Diagnosis not present

## 2020-11-14 DIAGNOSIS — A419 Sepsis, unspecified organism: Secondary | ICD-10-CM | POA: Diagnosis not present

## 2020-11-14 LAB — BASIC METABOLIC PANEL
Anion gap: 10 (ref 5–15)
BUN: 11 mg/dL (ref 8–23)
CO2: 24 mmol/L (ref 22–32)
Calcium: 7.9 mg/dL — ABNORMAL LOW (ref 8.9–10.3)
Chloride: 103 mmol/L (ref 98–111)
Creatinine, Ser: 0.78 mg/dL (ref 0.61–1.24)
GFR, Estimated: >60 mL/min (ref >60)
Glucose, Bld: 131 mg/dL — ABNORMAL HIGH (ref 70–99)
Potassium: 3.8 mmol/L (ref 3.5–5.1)
Sodium: 137 mmol/L (ref 135–145)

## 2020-11-14 LAB — SURGICAL PATHOLOGY

## 2020-11-14 LAB — GLUCOSE, CAPILLARY
Glucose-Capillary: 106 mg/dL — ABNORMAL HIGH (ref 70–99)
Glucose-Capillary: 183 mg/dL — ABNORMAL HIGH (ref 70–99)
Glucose-Capillary: 199 mg/dL — ABNORMAL HIGH (ref 70–99)
Glucose-Capillary: 240 mg/dL — ABNORMAL HIGH (ref 70–99)

## 2020-11-14 LAB — CBC
HCT: 30.4 % — ABNORMAL LOW (ref 39.0–52.0)
Hemoglobin: 9.9 g/dL — ABNORMAL LOW (ref 13.0–17.0)
MCH: 31.6 pg (ref 26.0–34.0)
MCHC: 32.6 g/dL (ref 30.0–36.0)
MCV: 97.1 fL (ref 80.0–100.0)
Platelets: 203 10*3/uL (ref 150–400)
RBC: 3.13 MIL/uL — ABNORMAL LOW (ref 4.22–5.81)
RDW: 18.6 % — ABNORMAL HIGH (ref 11.5–15.5)
WBC: 8.9 10*3/uL (ref 4.0–10.5)
nRBC: 0 % (ref 0.0–0.2)

## 2020-11-14 LAB — CK: Total CK: 134 U/L (ref 49–397)

## 2020-11-14 MED ORDER — METOPROLOL SUCCINATE ER 50 MG PO TB24
50.0000 mg | ORAL_TABLET | Freq: Every day | ORAL | Status: DC
  Administered 2020-11-14 – 2020-11-29 (×14): 50 mg via ORAL
  Filled 2020-11-14 (×16): qty 1

## 2020-11-14 MED ORDER — APIXABAN 5 MG PO TABS
5.0000 mg | ORAL_TABLET | Freq: Two times a day (BID) | ORAL | Status: DC
  Administered 2020-11-14 – 2020-11-29 (×31): 5 mg via ORAL
  Filled 2020-11-14 (×31): qty 1
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
## 2020-11-14 NOTE — Progress Notes (Signed)
Progress Note    [name] AmassRicky W Wadhwa  WUJ:[id number] DOB: 1951/01/15  DOA: 10/25/2020 PCP: Suzan Slickucker, Alethea Y, MD    Brief Narrative:    Medical records reviewed and are as summarized below:  [name] is an 70 y.o. male with hypertension HLD, T2DM, a flutter, CAD, history of a stroke with left hemiparesis admitted with left foot wound from recent trauma.   Pt was [facility] on [facility] at [facility], vascular surgery was consulted and transferred to [facility] 1/17-but patient refused amputation. Patient had complicated hospitalization with acute hypoxic respiratory failure acute on chronic systolic CHF.  Seen by cardiology and vascular.  Palliative care was consulted since he initially refused amputation. After family meeting with palliative care 11/04/20 patient and wife wanted to proceed with amputation-vascular surgery and cardiology were notified for preop ( due to new low lvef). But family decided against surgery on 1/26 eventually on 1/29 family reverted to full code and proceeded with lt AKA 1/31   Assessment/Plan:   Principal Problem:   Sepsis --due to Infected Lt Foot/Ostepmyleitis---POA Active Problems:   Atrial flutter (HCC)   Mixed hyperlipidemia   CAD, multiple vessel, 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   Essential hypertension   GERD (gastroesophageal reflux disease)   BPH (benign prostatic hyperplasia)   Acute osteomyelitis of toe, left (HCC)   Diabetic ulcer of left heel (HCC)   Fracture of left toe   Leukocytosis   Hypokalemia   Hypoalbuminemia   Hyperglycemia due to diabetes mellitus (HCC)   History of stroke   PAD (peripheral artery disease) /SMA Stenosis/Rt and Lt SFA Stenosis/   Osteomyelitis of left foot (HCC)   Cardiomyopathy (HCC)   Palliative care by specialist   Goals of care, counseling/discussion    Severe sepsis POA due to left foot osteomyelitis/left foot osteomyelitis: 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  Luciana Axeomer-- okay to discontinue all antibiotics.  Febrile episode overnight 1/31-2/1: -chest x-ray-no obvious pneumonia/UA on those noted positive but leuk negative and WBC 0-5, less likely UTI, blood culture pending. -duplex negative for acute DVT but does have superficial  Acute metabolic encephalopathy with lethargy/somnolence:multi-factorial- from post op/fever/pain medications/polypharmacy.  No hypercapnia on VBG.  Minimize opiates/oxycodone -appears resolved  Acute blood loss anemia hemoglobin dropped to 8.7 g, monitor.  Suspect multifactorial blood loss anemia in the setting of surgery, limb loss and chronic disease.  Chronic combined systolic/diastolic NFA:OZHYCHF:Echo with EF 25 to 30%-which is new, previously 50% 2019, seen by cardiology-on Toprol and Lasix,no plan for invasive work-up per cards.  No evidence of fluid overload  Ischemic cardiomyopathy/CAD-multivessel with history of CABG  12/2013/ hyperlipidemia/mildly elevated troponin subtle elevation flat trend nonischemic: Stable, continue Lipitor, Plavix, metoprolol. No further clinical work-up recommended.  PAF/a flutter with RVR : Rate fairly controlled on Toprol, amiodarone.therapeutic  -eliquis  Acute hypoxic respiratory failure: post op resolved.  Insulin-dependent type 2 diabetes mellitus - stable HbA1c 6.9.  Stable, cont  Lantus, ssi .  Hypokalemia/magnesium: -monitor  History of CVA with left hemiparesis/PAD/HTN: Cont his statin/Plavix/Toprol  CKD stage II,at baseline.  Dementia:  Cont on Aricept/Namenda, minmize edatives, cont supportive care fall precaution.  GERD:Continue PPI  QMV:HQIONGEXBPH:continue finasteride  BMW:UXLKGMWNUUGOC:Palliative care has been following very closely.After extensive family  W/ PMT 1/29 family wanted to proceed with amputation. And was changed to full code. Continue to monitor closely high risk of decompensation and readmission. Palliative care to follow along closely.  obesity Body mass index  is  38.09 kg/m.   Family Communication/Anticipated D/C date and plan/Code Status   DVT prophylaxis: eliquis Code Status: Full Code.  Disposition Plan: Status is: Inpatient  Remains inpatient appropriate because:Inpatient level of care appropriate due to severity of illness   Dispo: The patient is from: Home              Anticipated d/c is to: SNF              Anticipated d/c date is: 1 day              Patient currently is not medically stable to d/c.   Difficult to place patient No         Medical Consultants:    Vascular  cards  Subjective:   No complaints  Objective:    Vitals:   11/13/20 1700 11/13/20 2302 11/14/20 0300 11/14/20 0810  BP: 125/71 120/70 125/65 132/81  Pulse: (!) 109 (!) 103 98 100  Resp: 13 16 13 17   Temp:  98.3 F (36.8 C) 98.5 F (36.9 C) 97.9 F (36.6 C)  TempSrc:  Oral Oral Oral  SpO2: 99% 95% 98% 99%  Weight:      Height:        Intake/Output Summary (Last 24 hours) at 11/14/2020 1112 Last data filed at 11/13/2020 2209 Gross per 24 hour  Intake 840 ml  Output -  Net 840 ml   Filed Weights   10/25/20 1824 10/26/20 1850 10/27/20 0500  Weight: 116.1 kg 110.5 kg 110.3 kg    Exam:  General: Appearance:    Obese male in no acute distress     Lungs:     respirations unlabored  Heart:    Tachycardic.irr No murmurs, rubs, or gallops.   MS:   Left above knee amputation noted.   Neurologic:   Awake, alert, pleasant and cooperative    Data Reviewed:   I have personally reviewed following labs and imaging studies:  Labs: Labs show the following:   Basic Metabolic Panel: Recent Labs  Lab 11/10/20 0650 11/11/20 0430 11/12/20 0400 11/13/20 0553 11/14/20 0439  NA 137 138 136 135 137  K 4.1 3.8 4.2 3.7 3.8  CL 106 106 103 104 103  CO2 24 26 25 25 24   GLUCOSE 144* 139* 223* 146* 131*  BUN 10 9 8 11 11   CREATININE 0.85 0.86 0.90 0.79 0.78  CALCIUM 8.1* 8.0* 8.0* 7.7* 7.9*   GFR Estimated Creatinine Clearance: 104.8  mL/min (by C-G formula based on SCr of 0.78 mg/dL). Liver Function Tests: No results for input(s): AST, ALT, ALKPHOS, BILITOT, PROT, ALBUMIN in the last 168 hours. No results for input(s): LIPASE, AMYLASE in the last 168 hours. No results for input(s): AMMONIA in the last 168 hours. Coagulation profile Recent Labs  Lab 11/11/20 0430  INR 1.2    CBC: Recent Labs  Lab 11/10/20 0650 11/11/20 0430 11/12/20 0400 11/13/20 0553 11/14/20 0439  WBC 13.5* 14.5* 14.4* 11.0* 8.9  HGB 10.8* 10.2* 10.0* 8.7* 9.9*  HCT 34.4* 31.3* 31.9* 28.2* 30.4*  MCV 97.2 96.6 98.2 99.3 97.1  PLT 277 244 245 183 203   Cardiac Enzymes: Recent Labs  Lab 11/14/20 0439  CKTOTAL 134   BNP (last 3 results) No results for input(s): PROBNP in the last 8760 hours. CBG: Recent Labs  Lab 11/13/20 0750 11/13/20 1201 11/13/20 1621 11/13/20 2104 11/14/20 0810  GLUCAP 107* 141* 200* 259* 106*   D-Dimer: No results for input(s): DDIMER in  the last 72 hours. Hgb A1c: No results for input(s): HGBA1C in the last 72 hours. Lipid Profile: No results for input(s): CHOL, HDL, LDLCALC, TRIG, CHOLHDL, LDLDIRECT in the last 72 hours. Thyroid function studies: No results for input(s): TSH, T4TOTAL, T3FREE, THYROIDAB in the last 72 hours.  Invalid input(s): FREET3 Anemia work up: No results for input(s): VITAMINB12, FOLATE, FERRITIN, TIBC, IRON, RETICCTPCT in the last 72 hours. Sepsis Labs: Recent Labs  Lab 11/11/20 0430 11/12/20 0400 11/13/20 0553 11/14/20 0439  WBC 14.5* 14.4* 11.0* 8.9    Microbiology Recent Results (from the past 240 hour(s))  SARS Coronavirus 2 by RT PCR (hospital order, performed in [facility] hospital lab) Nasopharyngeal Nasopharyngeal Swab     Status: None   Collection Time: 11/10/20  1:04 PM   Specimen: Nasopharyngeal Swab  Result Value Ref Range Status   SARS Coronavirus 2 NEGATIVE NEGATIVE Final    Comment: (NOTE) SARS-CoV-2 target nucleic acids are NOT DETECTED.  The  SARS-CoV-2 RNA is generally detectable in upper and lower respiratory specimens during the acute phase of infection. The lowest concentration of SARS-CoV-2 viral copies this assay can detect is 250 copies / mL. A negative result does not preclude SARS-CoV-2 infection and should not be used as the sole basis for treatment or other patient management decisions.  A negative result may occur with improper specimen collection / handling, submission of specimen other than nasopharyngeal swab, presence of viral mutation(s) within the areas targeted by this assay, and inadequate number of viral copies (<250 copies / mL). A negative result must be combined with clinical observations, patient history, and epidemiological information.  Fact Sheet for Patients:   StrictlyIdeas.no  Fact Sheet for Healthcare Providers: BankingDealers.co.za  This test is not yet approved or  cleared by the [place] FDA and has been authorized for detection and/or diagnosis of SARS-CoV-2 by FDA under an Emergency Use Authorization (EUA).  This EUA will remain in effect (meaning this test can be used) for the duration of the COVID-19 declaration under Section 564(b)(1) of the Act, 21 U.S.C. section 360bbb-3(b)(1), unless the authorization is terminated or revoked sooner.  Performed at [facility], [place] [address] [place], [place] [address]   Culture, blood (routine x 2)     Status: None (Preliminary result)   Collection Time: 11/12/20  8:31 AM   Specimen: BLOOD RIGHT HAND  Result Value Ref Range Status   Specimen Description BLOOD RIGHT HAND  Final   Special Requests AEROBIC BOTTLE ONLY Blood Culture adequate volume  Final   Culture   Final    NO GROWTH 2 DAYS Performed at [facility], [place] [address], [place], [place] [address]    Report Status PENDING  Incomplete  Culture, blood (routine x 2)     Status: None (Preliminary result)   Collection  Time: 11/12/20  8:31 AM   Specimen: BLOOD RIGHT HAND  Result Value Ref Range Status   Specimen Description BLOOD RIGHT HAND  Final   Special Requests AEROBIC BOTTLE ONLY Blood Culture adequate volume  Final   Culture   Final    NO GROWTH 2 DAYS Performed at [facility], [place] [address], [place], [place] [address]    Report Status PENDING  Incomplete    Procedures and diagnostic studies:  VAS [place] UPPER EXTREMITY VENOUS DUPLEX  Result Date: 11/13/2020 UPPER VENOUS STUDY  Indications: Swelling RT arm, PICC line Limitations: Bandaging around PICC insertion. Comparison Study: No prior studies. Performing  Technologist: Darlin Coco RDMS  Examination Guidelines: A complete evaluation includes B-mode imaging, spectral Doppler, color Doppler, and power Doppler as needed of all accessible portions of each vessel. Bilateral testing is considered an integral part of a complete examination. Limited examinations for reoccurring indications may be performed as noted.  Right Findings: +----------+------------+---------+-----------+----------+---------------------+ RIGHT     CompressiblePhasicitySpontaneousProperties       Summary        +----------+------------+---------+-----------+----------+---------------------+ IJV           Full       Yes       Yes                                    +----------+------------+---------+-----------+----------+---------------------+ Subclavian    Full       Yes       Yes                                    +----------+------------+---------+-----------+----------+---------------------+ Axillary      Full       Yes       Yes                                    +----------+------------+---------+-----------+----------+---------------------+ Brachial      Full       Yes       Yes                Some segments not                                                        well visualized due                                                           to bandaging-                                                          visualized segments                                                             patent         +----------+------------+---------+-----------+----------+---------------------+ Radial        Full                                                        +----------+------------+---------+-----------+----------+---------------------+  Ulnar         Full                                                        +----------+------------+---------+-----------+----------+---------------------+ Cephalic    Partial      Yes       Yes                Age Indeterminate   +----------+------------+---------+-----------+----------+---------------------+ Basilic                                              Not well visualized                                                        due to bandaging    +----------+------------+---------+-----------+----------+---------------------+  Left Findings: +----------+------------+---------+-----------+----------+-------+ LEFT      CompressiblePhasicitySpontaneousPropertiesSummary +----------+------------+---------+-----------+----------+-------+ Subclavian    Full       Yes       Yes                      +----------+------------+---------+-----------+----------+-------+  Summary:  Right: No evidence of deep vein thrombosis in the upper extremity. Findings consistent with age indeterminate superficial vein thrombosis involving the right cephalic vein. However, unable to visualize the basilic vein along with some segments of the brachial vein at the PICC insertion.  Left: No evidence of thrombosis in the subclavian.  *See table(s) above for measurements and observations.  Diagnosing physician: Harold Barban MD Electronically signed by Harold Barban MD on 11/13/2020 at 7:12:19 PM.    Final     Medications:   . amiodarone  200 mg Oral Daily  . atorvastatin   40 mg Oral Daily  . baclofen  5 mg Oral TID  . Chlorhexidine Gluconate Cloth  6 each Topical Daily  . cholecalciferol  5,000 Units Oral Q1200  . clopidogrel  75 mg Oral Daily  . donepezil  5 mg Oral QHS  . feeding supplement (GLUCERNA SHAKE)  237 mL Oral TID BM  . ferrous sulfate  325 mg Oral BID WC  . finasteride  5 mg Oral Q1200  . furosemide  40 mg Oral Daily  . gabapentin  300 mg Oral TID  . Gerhardt's butt cream   Topical QID  . insulin aspart  0-15 Units Subcutaneous TID WC  . insulin aspart  0-5 Units Subcutaneous QHS  . insulin glargine  10 Units Subcutaneous QHS  . loratadine  5 mg Oral QHS  . memantine  5 mg Oral BID  . metoprolol succinate  50 mg Oral Daily  . pantoprazole  40 mg Oral Daily  . potassium chloride  20 mEq Oral Daily  . sodium chloride flush  10-40 mL Intracatheter Q12H   Continuous Infusions:   LOS: 20 days   Geradine Girt  [facility]   How to contact the [facility] Attending or Consulting provider [place] or covering provider during after hours [place], for this patient?  1. Check the care team  in [facility] and look for a) attending/consulting [facility] provider listed and b) the [facility] team listed 2. Log into [email or web address] and use [place]'s universal password to access. If you do not have the password, please contact the hospital operator. 3. Locate the [facility] provider you are looking for under [facility] and page to a number that you can be directly reached. 4. If you still have difficulty reaching the provider, please page the [facility] (Director on Call) for the Hospitalists listed on amion for assistance.  11/14/2020, 11:12 AM

## 2020-11-14 NOTE — Progress Notes (Signed)
VASCULAR SURGERY ASSESSMENT & PLAN:   3 Days Post-Op [name] W Daltonis a 70 y.o.males/p left AKA fornon-salvageable LLE ischemic ulceration / gangrene. Residual limb incision well approximated and flaps are warm and well perfused. Rec: SNF/rehab  Afebrile times 24 hours. Leukocytosis resolved. Multiple antimicrobials on board.   H and H improved this morning to 9.9. No evidence of post-op bleeding. OK to restart Eliquis.  SUBJECTIVE:   No complaints  PHYSICAL EXAM:   Vitals:   11/13/20 1300 11/13/20 1700 11/13/20 2302 11/14/20 0300  BP: 92/60 125/71 120/70 125/65  Pulse: (!) 107 (!) 109 (!) 103 98  Resp: (!) 22 13 16 13   Temp:   98.3 F (36.8 C) 98.5 F (36.9 C)  TempSrc:   Oral Oral  SpO2: 92% 99% 95% 98%  Weight:      Height:       General appearance: Awake, alert in no apparent distress Cardiac: Heart rate and rhythm are regular Respirations: Nonlabored Extremities: Amputation site incision is well approximated without bleeding or hematoma.  Anterior and posterior flaps are warm and well-perfused  LABS:   Lab Results  Component Value Date   WBC 8.9 11/14/2020   HGB 9.9 (L) 11/14/2020   HCT 30.4 (L) 11/14/2020   MCV 97.1 11/14/2020   PLT 203 11/14/2020   Lab Results  Component Value Date   CREATININE 0.78 11/14/2020   Lab Results  Component Value Date   INR 1.2 11/11/2020   CBG (last 3)  Recent Labs    11/13/20 1201 11/13/20 1621 11/13/20 2104  GLUCAP 141* 200* 259*    PROBLEM LIST:    Principal Problem:   Sepsis --due to Infected Lt Foot/Ostepmyleitis---POA Active Problems:   Atrial flutter (HCC)   Mixed hyperlipidemia   CAD, multiple vessel, 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   Essential hypertension   GERD (gastroesophageal reflux disease)   BPH (benign prostatic hyperplasia)   Acute osteomyelitis of toe, left (HCC)   Diabetic ulcer of left heel (HCC)   Fracture of left toe   Leukocytosis   Hypokalemia   Hypoalbuminemia   Hyperglycemia due to  diabetes mellitus ([place])   History of stroke   PAD (peripheral artery disease) /SMA Stenosis/Rt and Lt SFA Stenosis/   Osteomyelitis of left foot ([place])   Cardiomyopathy ([place])   Palliative care by specialist   Goals of care, counseling/discussion   CURRENT MEDS:   . amiodarone  200 mg Oral Daily  . atorvastatin  40 mg Oral Daily  . baclofen  5 mg Oral TID  . Chlorhexidine Gluconate Cloth  6 each Topical Daily  . cholecalciferol  5,000 Units Oral Q1200  . clopidogrel  75 mg Oral Daily  . donepezil  5 mg Oral QHS  . feeding supplement (GLUCERNA SHAKE)  237 mL Oral TID BM  . ferrous sulfate  325 mg Oral BID WC  . finasteride  5 mg Oral Q1200  . furosemide  40 mg Oral Daily  . gabapentin  300 mg Oral TID  . Gerhardt's butt cream   Topical QID  . insulin aspart  0-15 Units Subcutaneous TID WC  . insulin aspart  0-5 Units Subcutaneous QHS  . insulin glargine  10 Units Subcutaneous QHS  . loratadine  5 mg Oral QHS  . memantine  5 mg Oral BID  . metoprolol succinate  25 mg Oral Daily  . pantoprazole  40 mg Oral Daily  . potassium chloride  20 mEq Oral Daily  . sodium chloride flush  10-40 mL Intracatheter Q12H    Barbie Banner, [place] Office: [phone] 11/14/2020

## 2020-11-14 NOTE — Progress Notes (Signed)
   Progress Note  Patient Name: JIRAIYA MCEWAN Date of Encounter: 11/14/2020  [facility] Cardiologist: Kirk Ruths, MD   Telemetry personally reviewed.  Patient remains in atrial flutter with rate upper normal to mildly increased.  I will increase Toprol to 50 mg daily and continue amiodarone.  Would begin apixaban 5 mg twice daily.  Will consider adding ARB if blood pressure allows prior to discharge.  For questions or updates, please contact [place] Please consult [email or web address] for contact info under        Signed, Kirk Ruths, MD  11/14/2020, 8:41 AM

## 2020-11-15 DIAGNOSIS — A419 Sepsis, unspecified organism: Secondary | ICD-10-CM | POA: Diagnosis not present

## 2020-11-15 DIAGNOSIS — I10 Essential (primary) hypertension: Secondary | ICD-10-CM | POA: Diagnosis not present

## 2020-11-15 DIAGNOSIS — I483 Typical atrial flutter: Secondary | ICD-10-CM | POA: Diagnosis not present

## 2020-11-15 DIAGNOSIS — Z515 Encounter for palliative care: Secondary | ICD-10-CM | POA: Diagnosis not present

## 2020-11-15 LAB — GLUCOSE, CAPILLARY
Glucose-Capillary: 178 mg/dL — ABNORMAL HIGH (ref 70–99)
Glucose-Capillary: 111 mg/dL — ABNORMAL HIGH (ref 70–99)
Glucose-Capillary: 197 mg/dL — ABNORMAL HIGH (ref 70–99)
Glucose-Capillary: 204 mg/dL — ABNORMAL HIGH (ref 70–99)
Glucose-Capillary: 174 mg/dL — ABNORMAL HIGH (ref 70–99)

## 2020-11-15 LAB — CBC
HCT: 27.4 % — ABNORMAL LOW (ref 39.0–52.0)
Hemoglobin: 8.9 g/dL — ABNORMAL LOW (ref 13.0–17.0)
MCH: 31.6 pg (ref 26.0–34.0)
MCHC: 32.5 g/dL (ref 30.0–36.0)
MCV: 97.2 fL (ref 80.0–100.0)
Platelets: 181 10*3/uL (ref 150–400)
RBC: 2.82 MIL/uL — ABNORMAL LOW (ref 4.22–5.81)
RDW: 18.2 % — ABNORMAL HIGH (ref 11.5–15.5)
WBC: 11.5 10*3/uL — ABNORMAL HIGH (ref 4.0–10.5)
nRBC: 0 % (ref 0.0–0.2)

## 2020-11-15 MED ORDER — LOSARTAN POTASSIUM 50 MG PO TABS
25.0000 mg | ORAL_TABLET | Freq: Every day | ORAL | Status: DC
  Administered 2020-11-15 – 2020-11-29 (×14): 25 mg via ORAL
  Filled 2020-11-15 (×15): qty 1

## 2020-11-15 MED ORDER — OXYCODONE-ACETAMINOPHEN 5-325 MG PO TABS
1.0000 | ORAL_TABLET | Freq: Four times a day (QID) | ORAL | Status: DC | PRN
  Administered 2020-11-15 – 2020-11-17 (×7): 1 via ORAL
  Filled 2020-11-15 (×9): qty 1

## 2020-11-15 MED ORDER — GABAPENTIN 300 MG PO CAPS
300.0000 mg | ORAL_CAPSULE | Freq: Three times a day (TID) | ORAL | Status: DC
  Administered 2020-11-15 – 2020-11-22 (×20): 300 mg via ORAL
  Filled 2020-11-15 (×20): qty 1

## 2020-11-15 NOTE — Progress Notes (Addendum)
Progress Note    [name]  [id number] DOB: 1951-04-17  DOA: 10/25/2020 PCP: Leeanne Rio, MD    Brief Narrative:    Medical records reviewed and are as summarized below:  [name] is an 71 y.o. male with hypertension HLD, T2DM, a flutter, CAD, history of a stroke with left hemiparesis admitted with left foot wound from recent trauma.   Pt was admitted on antibiotics at [facility], vascular surgery was consulted and transferred to [facility] 1/17-but patient refused amputation. Patient had complicated hospitalization with acute hypoxic respiratory failure acute on chronic systolic CHF.  Seen by cardiology and vascular.  Palliative care was consulted since he initially refused amputation. After family meeting with palliative care 11/04/20 patient and wife wanted to proceed with amputation-vascular surgery and cardiology were notified for preop ( due to new low lvef). But family decided against surgery on 1/26 eventually on 1/29 family reverted to full code and proceeded with L AKA 1/31. Working on placement.   Assessment/Plan:   Principal Problem:   Sepsis --due to Infected Lt Foot/Ostepmyleitis---POA Active Problems:   Atrial flutter (HCC)   Mixed hyperlipidemia   CAD, multiple vessel, 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   Essential hypertension   GERD (gastroesophageal reflux disease)   BPH (benign prostatic hyperplasia)   Acute osteomyelitis of toe, left (HCC)   Diabetic ulcer of left heel (HCC)   Fracture of left toe   Leukocytosis   Hypokalemia   Hypoalbuminemia   Hyperglycemia due to diabetes mellitus (HCC)   History of stroke   PAD (peripheral artery disease) /SMA Stenosis/Rt and Lt SFA Stenosis/   Osteomyelitis of left foot (HCC)   Cardiomyopathy ([place])   Palliative care by specialist   Goals of care, counseling/discussion    Severe sepsis POA due to left foot osteomyelitis/left foot osteomyelitis: 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 Linus Salmons-- okay to discontinue all antibiotics.  Febrile episode overnight 1/31-2/1: no further fevers -chest x-ray-no obvious pneumonia/UA on those noted positive but leuk negative and WBC 0-5, less likely UTI, blood culture NGTD -duplex negative for acute DVT but does have superficial thrombosis -on elqiuis  Acute metabolic encephalopathy with lethargy/somnolence:multi-factorial- from post op/fever/pain medications/polypharmacy.  No hypercapnia on VBG.  Minimize opiates/oxycodone -appears resolved -needs mobilization and limitation of narcotics  Acute blood loss anemia hemoglobin dropped to 8.7 g, monitor.  Suspect multifactorial blood loss anemia in the setting of surgery, limb loss and chronic disease.  Chronic combined systolic/diastolic [id number] with EF 25 to 30%-which is new, previously 50% 2019, seen by cardiology-on Toprol and Lasix,no plan for invasive work-up per cards.  No evidence of fluid overload  Ischemic cardiomyopathy/CAD-multivessel with history of CABG  12/2013/ hyperlipidemia/mildly elevated troponin subtle elevation flat trend nonischemic: Stable, continue Lipitor, Plavix, metoprolol. No further clinical work-up recommended.  PAF/a flutter with RVR : Rate fairly controlled on Toprol-being adjusted by cardiology, amiodarone.therapeutic  -eliquis  Acute hypoxic respiratory failure: post op resolved.  Insulin-dependent type 2 diabetes mellitus - stable HbA1c 6.9.  Stable, cont  Lantus, ssi .  Hypokalemia/magnesium: -monitor  History of CVA with left hemiparesis/PAD/HTN: Cont his statin/Plavix/Toprol  CKD stage II,at baseline.  Dementia:  Cont on Aricept/Namenda, minmize edatives, cont supportive care fall precaution.  GERD:Continue PPI  [id number] finasteride  [id number] care has been following very closely.After extensive family  W/ PMT 1/29 family wanted to proceed with amputation. And was changed to full  code. Continue to monitor closely high risk of decompensation and readmission. Palliative care to follow along closely.  obesity Body mass index is 38.09 kg/m.  Poor overall prognosis   Family Communication/Anticipated D/C date and plan/Code Status   DVT prophylaxis: eliquis Code Status: Full Code.  Disposition Plan: Status is: Inpatient  Remains inpatient appropriate because:Inpatient level of care appropriate due to severity of illness   Dispo: The patient is from: Home              Anticipated d/c is to: SNF              Anticipated d/c date is: 1 day              Patient currently is not medically stable to d/c.- vascular and cardiology final recommendations   Difficult to place patient No         Medical Consultants:    Vascular  cards  Subjective:   C/o pain all over Per nursing had to be I/O cath last PM  Objective:    Vitals:   11/14/20 2000 11/15/20 0026 11/15/20 0400 11/15/20 0800  BP: 104/60 114/71  108/76  Pulse: (!) 104   (!) 107  Resp: 16  15 19   Temp: 98.4 F (36.9 C) 98.4 F (36.9 C) 98.2 F (36.8 C) 97.6 F (36.4 C)  TempSrc: Oral Oral Oral Oral  SpO2: 99%   98%  Weight:      Height:        Intake/Output Summary (Last 24 hours) at 11/15/2020 1041 Last data filed at 11/14/2020 2150 Gross per 24 hour  Intake 490 ml  Output -  Net 490 ml   Filed Weights   10/25/20 1824 10/26/20 1850 10/27/20 0500  Weight: 116.1 kg 110.5 kg 110.3 kg    Exam:  General: Appearance:    Obese male who appears chronically ill     Lungs:     respirations unlabored  Heart:    Tachycardic. Normal rhythm. No murmurs, rubs, or gallops.   MS:   Left aka amputation noted.   Neurologic:   Awake, alert, oriented x 3. No apparent focal neurological           defect.      Data Reviewed:   I have personally reviewed following labs and imaging studies:  Labs: Labs show the following:   Basic Metabolic Panel: Recent Labs  Lab 11/10/20 0650  11/11/20 0430 11/12/20 0400 11/13/20 0553 11/14/20 0439  NA 137 138 136 135 137  K 4.1 3.8 4.2 3.7 3.8  CL 106 106 103 104 103  CO2 24 26 25 25 24   GLUCOSE 144* 139* 223* 146* 131*  BUN 10 9 8 11 11   CREATININE 0.85 0.86 0.90 0.79 0.78  CALCIUM 8.1* 8.0* 8.0* 7.7* 7.9*   GFR Estimated Creatinine Clearance: 104.8 mL/min (by C-G formula based on SCr of 0.78 mg/dL). Liver Function Tests: No results for input(s): AST, ALT, ALKPHOS, BILITOT, PROT, ALBUMIN in the last 168 hours. No results for input(s): LIPASE, AMYLASE in the last 168 hours. No results for input(s): AMMONIA in the last 168 hours. Coagulation profile Recent Labs  Lab 11/11/20 0430  INR 1.2    CBC: Recent Labs  Lab 11/11/20 0430 11/12/20 0400 11/13/20 0553 11/14/20 0439 11/15/20 0534  WBC 14.5* 14.4* 11.0* 8.9 11.5*  HGB 10.2* 10.0* 8.7* 9.9* 8.9*  HCT 31.3* 31.9* 28.2* 30.4* 27.4*  MCV 96.6 98.2 99.3 97.1 97.2  PLT 244 245 183  203 181   Cardiac Enzymes: Recent Labs  Lab 11/14/20 0439  CKTOTAL 134   BNP (last 3 results) No results for input(s): PROBNP in the last 8760 hours. CBG: Recent Labs  Lab 11/14/20 1121 11/14/20 1601 11/14/20 2127 11/15/20 0408 11/15/20 0817  GLUCAP 183* 199* 240* 178* 111*   D-Dimer: No results for input(s): DDIMER in the last 72 hours. Hgb A1c: No results for input(s): HGBA1C in the last 72 hours. Lipid Profile: No results for input(s): CHOL, HDL, LDLCALC, TRIG, CHOLHDL, LDLDIRECT in the last 72 hours. Thyroid function studies: No results for input(s): TSH, T4TOTAL, T3FREE, THYROIDAB in the last 72 hours.  Invalid input(s): FREET3 Anemia work up: No results for input(s): VITAMINB12, FOLATE, FERRITIN, TIBC, IRON, RETICCTPCT in the last 72 hours. Sepsis Labs: Recent Labs  Lab 11/12/20 0400 11/13/20 0553 11/14/20 0439 11/15/20 0534  WBC 14.4* 11.0* 8.9 11.5*    Microbiology Recent Results (from the past 240 hour(s))  SARS Coronavirus 2 by RT PCR  (hospital order, performed in [facility] hospital lab) Nasopharyngeal Nasopharyngeal Swab     Status: None   Collection Time: 11/10/20  1:04 PM   Specimen: Nasopharyngeal Swab  Result Value Ref Range Status   SARS Coronavirus 2 NEGATIVE NEGATIVE Final    Comment: (NOTE) SARS-CoV-2 target nucleic acids are NOT DETECTED.  The SARS-CoV-2 RNA is generally detectable in upper and lower respiratory specimens during the acute phase of infection. The lowest concentration of SARS-CoV-2 viral copies this assay can detect is 250 copies / mL. A negative result does not preclude SARS-CoV-2 infection and should not be used as the sole basis for treatment or other patient management decisions.  A negative result may occur with improper specimen collection / handling, submission of specimen other than nasopharyngeal swab, presence of viral mutation(s) within the areas targeted by this assay, and inadequate number of viral copies (<250 copies / mL). A negative result must be combined with clinical observations, patient history, and epidemiological information.  Fact Sheet for Patients:   StrictlyIdeas.no  Fact Sheet for Healthcare Providers: BankingDealers.co.za  This test is not yet approved or  cleared by the [place] FDA and has been authorized for detection and/or diagnosis of SARS-CoV-2 by FDA under an Emergency Use Authorization (EUA).  This EUA will remain in effect (meaning this test can be used) for the duration of the COVID-19 declaration under Section 564(b)(1) of the Act, 21 U.S.C. section 360bbb-3(b)(1), unless the authorization is terminated or revoked sooner.  Performed at [facility], [place] [address], [place], [place] [address]   Culture, blood (routine x 2)     Status: None (Preliminary result)   Collection Time: 11/12/20  8:31 AM   Specimen: BLOOD RIGHT HAND  Result Value Ref Range Status   Specimen Description BLOOD  RIGHT HAND  Final   Special Requests AEROBIC BOTTLE ONLY Blood Culture adequate volume  Final   Culture   Final    NO GROWTH 2 DAYS Performed at [facility], [place] [address] [place], [place] [address]    Report Status PENDING  Incomplete  Culture, blood (routine x 2)     Status: None (Preliminary result)   Collection Time: 11/12/20  8:31 AM   Specimen: BLOOD RIGHT HAND  Result Value Ref Range Status   Specimen Description BLOOD RIGHT HAND  Final   Special Requests AEROBIC BOTTLE ONLY Blood Culture adequate volume  Final   Culture   Final    NO GROWTH  2 DAYS Performed at [facility], [address] [place], [place] [address]    Report Status PENDING  Incomplete    Procedures and diagnostic studies:  VAS US UPPER EXTREMITY VENOUS DUPLEX  Result Date: 11/13/2020 UPPER VENOUS STUDY  Indications: Swelling RT arm, PICC line Limitations: Bandaging around PICC insertion. Comparison Study: No prior studies. Performing Technologist: Jean Rosenthalachel Hodge RDMS  Examination Guidelines: A complete evaluation includes B-mode imaging, spectral Doppler, color Doppler, and power Doppler as needed of all accessible portions of each vessel. Bilateral testing is considered an integral part of a complete examination. Limited examinations for reoccurring indications may be performed as noted.  Right Findings: +----------+------------+---------+-----------+----------+---------------------+ RIGHT     CompressiblePhasicitySpontaneousProperties       Summary        +----------+------------+---------+-----------+----------+---------------------+ IJV           Full       Yes       Yes                                    +----------+------------+---------+-----------+----------+---------------------+ Subclavian    Full       Yes       Yes                                    +----------+------------+---------+-----------+----------+---------------------+ Axillary      Full       Yes       Yes                                     +----------+------------+---------+-----------+----------+---------------------+ Brachial      Full       Yes       Yes                Some segments not                                                        well visualized due                                                          to bandaging-                                                          visualized segments                                                             patent         +----------+------------+---------+-----------+----------+---------------------+ Radial  Full                                                        +----------+------------+---------+-----------+----------+---------------------+ Ulnar         Full                                                        +----------+------------+---------+-----------+----------+---------------------+ Cephalic    Partial      Yes       Yes                Age Indeterminate   +----------+------------+---------+-----------+----------+---------------------+ Basilic                                              Not well visualized                                                        due to bandaging    +----------+------------+---------+-----------+----------+---------------------+  Left Findings: +----------+------------+---------+-----------+----------+-------+ LEFT      CompressiblePhasicitySpontaneousPropertiesSummary +----------+------------+---------+-----------+----------+-------+ Subclavian    Full       Yes       Yes                      +----------+------------+---------+-----------+----------+-------+  Summary:  Right: No evidence of deep vein thrombosis in the upper extremity. Findings consistent with age indeterminate superficial vein thrombosis involving the right cephalic vein. However, unable to visualize the basilic vein along with some segments of the  brachial vein at the PICC insertion.  Left: No evidence of thrombosis in the subclavian.  *See table(s) above for measurements and observations.  Diagnosing physician: Harold Barban MD Electronically signed by Harold Barban MD on 11/13/2020 at 7:12:19 PM.    Final     Medications:   . amiodarone  200 mg Oral Daily  . apixaban  5 mg Oral BID  . atorvastatin  40 mg Oral Daily  . baclofen  5 mg Oral TID  . Chlorhexidine Gluconate Cloth  6 each Topical Daily  . cholecalciferol  5,000 Units Oral Q1200  . donepezil  5 mg Oral QHS  . feeding supplement (GLUCERNA SHAKE)  237 mL Oral TID BM  . ferrous sulfate  325 mg Oral BID WC  . finasteride  5 mg Oral Q1200  . furosemide  40 mg Oral Daily  . gabapentin  300 mg Oral TID  . Gerhardt's butt cream   Topical QID  . insulin aspart  0-15 Units Subcutaneous TID WC  . insulin aspart  0-5 Units Subcutaneous QHS  . insulin glargine  10 Units Subcutaneous QHS  . loratadine  5 mg Oral QHS  . losartan  25 mg Oral Daily  . memantine  5 mg Oral BID  . metoprolol succinate  50 mg Oral Daily  . pantoprazole  40 mg Oral Daily  .  potassium chloride  20 mEq Oral Daily  . sodium chloride flush  10-40 mL Intracatheter Q12H   Continuous Infusions:   LOS: 21 days   Geradine Girt  [facility]   How to contact the [facility] Attending or Consulting provider [place] or covering provider during after hours [place], for this patient?  1. Check the care team in [facility] and look for a) attending/consulting [facility] provider listed and b) the [facility] team listed 2. Log into [email or web address] and use [place]'s universal password to access. If you do not have the password, please contact the hospital operator. 3. Locate the [facility] provider you are looking for under [facility] and page to a number that you can be directly reached. 4. If you still have difficulty reaching the provider, please page the [facility] (Director on Call) for the Hospitalists listed on amion for  assistance.  11/15/2020, 10:41 AM

## 2020-11-15 NOTE — Progress Notes (Signed)
Progress Note  Patient Name: [name] Date of Encounter: 11/15/2020  [facility] Cardiologist: Kirk Ruths, MD   Subjective   Lethargic; denies CP  Inpatient Medications    Scheduled Meds: . amiodarone  200 mg Oral Daily  . apixaban  5 mg Oral BID  . atorvastatin  40 mg Oral Daily  . baclofen  5 mg Oral TID  . Chlorhexidine Gluconate Cloth  6 each Topical Daily  . cholecalciferol  5,000 Units Oral Q1200  . clopidogrel  75 mg Oral Daily  . donepezil  5 mg Oral QHS  . feeding supplement (GLUCERNA SHAKE)  237 mL Oral TID BM  . ferrous sulfate  325 mg Oral BID WC  . finasteride  5 mg Oral Q1200  . furosemide  40 mg Oral Daily  . gabapentin  300 mg Oral TID  . Gerhardt's butt cream   Topical QID  . insulin aspart  0-15 Units Subcutaneous TID WC  . insulin aspart  0-5 Units Subcutaneous QHS  . insulin glargine  10 Units Subcutaneous QHS  . loratadine  5 mg Oral QHS  . memantine  5 mg Oral BID  . metoprolol succinate  50 mg Oral Daily  . pantoprazole  40 mg Oral Daily  . potassium chloride  20 mEq Oral Daily  . sodium chloride flush  10-40 mL Intracatheter Q12H   Continuous Infusions:  PRN Meds: acetaminophen, haloperidol lactate, ketorolac, levalbuterol, ondansetron (ZOFRAN) IV, oxyCODONE-acetaminophen, sodium chloride flush   Vital Signs    Vitals:   11/14/20 2000 11/15/20 0026 11/15/20 0400 11/15/20 0800  BP: 104/60 114/71  108/76  Pulse: (!) 104   (!) 107  Resp: 16  15 19   Temp: 98.4 F (36.9 C) 98.4 F (36.9 C) 98.2 F (36.8 C) 97.6 F (36.4 C)  TempSrc: Oral Oral Oral Oral  SpO2: 99%   98%  Weight:      Height:        Intake/Output Summary (Last 24 hours) at 11/15/2020 0819 Last data filed at 11/14/2020 2150 Gross per 24 hour  Intake 730 ml  Output --  Net 730 ml   Last 3 Weights 10/27/2020 10/26/2020 10/25/2020  Weight (lbs) 243 lb 2.7 oz 243 lb 9.7 oz 256 lb  Weight (kg) 110.3 kg 110.5 kg 116.121 kg      Telemetry    Atrial flutter  rate approximately 100 - Personally Reviewed  Physical Exam   GEN: No acute distress. WD  Neck: supple Cardiac: RRR Respiratory: Clear to auscultation bilaterally; no wheeze GI: Soft, NT/ND MS: S/P left AKA Neuro:  Grossly intact Psych: Normal affect   Labs    High Sensitivity Troponin:   Recent Labs  Lab 11/05/20 2219 11/06/20 0013  TROPONINIHS 43* 42*      Chemistry Recent Labs  Lab 11/12/20 0400 11/13/20 0553 11/14/20 0439  NA 136 135 137  K 4.2 3.7 3.8  CL 103 104 103  CO2 25 25 24   GLUCOSE 223* 146* 131*  BUN 8 11 11   CREATININE 0.90 0.79 0.78  CALCIUM 8.0* 7.7* 7.9*  GFRNONAA >60 >60 >60  ANIONGAP 8 6 10      Hematology Recent Labs  Lab 11/13/20 0553 11/14/20 0439 11/15/20 0534  WBC 11.0* 8.9 11.5*  RBC 2.84* 3.13* 2.82*  HGB 8.7* 9.9* 8.9*  HCT 28.2* 30.4* 27.4*  MCV 99.3 97.1 97.2  MCH 30.6 31.6 31.6  MCHC 30.9 32.6 32.5  RDW 18.6* 18.6* 18.2*  PLT 183 203 181  Radiology    VAS [place] UPPER EXTREMITY VENOUS DUPLEX  Result Date: 11/13/2020 UPPER VENOUS STUDY  Indications: Swelling RT arm, PICC line Limitations: Bandaging around PICC insertion. Comparison Study: No prior studies. Performing Technologist: Darlin Coco RDMS  Examination Guidelines: A complete evaluation includes B-mode imaging, spectral Doppler, color Doppler, and power Doppler as needed of all accessible portions of each vessel. Bilateral testing is considered an integral part of a complete examination. Limited examinations for reoccurring indications may be performed as noted.  Right Findings: +----------+------------+---------+-----------+----------+---------------------+ RIGHT     CompressiblePhasicitySpontaneousProperties       Summary        +----------+------------+---------+-----------+----------+---------------------+ IJV           Full       Yes       Yes                                     +----------+------------+---------+-----------+----------+---------------------+ Subclavian    Full       Yes       Yes                                    +----------+------------+---------+-----------+----------+---------------------+ Axillary      Full       Yes       Yes                                    +----------+------------+---------+-----------+----------+---------------------+ Brachial      Full       Yes       Yes                Some segments not                                                        well visualized due                                                          to bandaging-                                                          visualized segments                                                             patent         +----------+------------+---------+-----------+----------+---------------------+ Radial        Full                                                        +----------+------------+---------+-----------+----------+---------------------+  Ulnar         Full                                                        +----------+------------+---------+-----------+----------+---------------------+ Cephalic    Partial      Yes       Yes                Age Indeterminate   +----------+------------+---------+-----------+----------+---------------------+ Basilic                                              Not well visualized                                                        due to bandaging    +----------+------------+---------+-----------+----------+---------------------+  Left Findings: +----------+------------+---------+-----------+----------+-------+ LEFT      CompressiblePhasicitySpontaneousPropertiesSummary +----------+------------+---------+-----------+----------+-------+ Subclavian    Full       Yes       Yes                       +----------+------------+---------+-----------+----------+-------+  Summary:  Right: No evidence of deep vein thrombosis in the upper extremity. Findings consistent with age indeterminate superficial vein thrombosis involving the right cephalic vein. However, unable to visualize the basilic vein along with some segments of the brachial vein at the PICC insertion.  Left: No evidence of thrombosis in the subclavian.  *See table(s) above for measurements and observations.  Diagnosing physician: Harold Barban MD Electronically signed by Harold Barban MD on 11/13/2020 at 7:12:19 PM.    Final     Patient Profile     70 y.o. male with a PMH of CAD s/p CABG in 2014, chronic combined CHF, paroxysmal atrial flutter, carotid artery disease with known right sided occlusion and moderate left stenosis, PVD, HTN, HLD, DM type 2, and CVA with left sided hemiparesis,whowas initially seen for CHF and atrial flutter.  Echocardiogram this admission shows ejection fraction 25 to 30%, mild left ventricular enlargement, mild left ventricular hypertrophy, mild left atrial enlargement, mild mitral regurgitation.  EF worse compared to previous.  Assessment & Plan    1 chronic combined systolic/diastolic congestive heart failure-patient appears to be euvolemic on examination today.  Continue Lasix at present dose.  2 atrial flutter-patient remains in atrial flutter.  Heart rate is approximately 100.  Continue amiodarone and metoprolol at present dose.  Apixaban has been resumed.    3 ischemic cardiomyopathy-LV function is worse compared to previous.  I do not think he is a good candidate for aggressive ischemia evaluation at this point.  We will continue medical therapy.  Continue Toprol.  We will add losartan 25 mg daily as tolerated by blood pressure.  4 sepsis-status post AKA.  Continue antibiotics per primary care.  5 coronary artery disease-continue statin.  Discontinue Plavix.  For questions or updates, please contact  [place] Please consult [email or web address] for contact info under        Signed, Kirk Ruths, MD  11/15/2020, 8:19 AM
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
## 2020-11-15 NOTE — TOC Progression Note (Signed)
Transition of Care [facility]) - Progression Note    Patient Details  Name: [name] MRN: [id number] Date of Birth: 1951/05/03  Transition of Care [facility]) CM/SW [place], [place] Phone Number: 11/15/2020, 4:21 PM  Clinical Narrative:     CSW called spouse-provided bed offer. Spouse is agreeable to [facility] when medically stable for discharge. CSW contacted [facility] - waiting on response to confirm bed offer.  Thurmond Butts, MSW, LCSW Clinical Social Worker   Expected Discharge Plan: Skilled Nursing Facility Barriers to Discharge: Continued Medical Work up  Expected Discharge Plan and Services Expected Discharge Plan: [facility] In-house Referral: Clinical Social Work   Post Acute Care Choice: Hospice Living arrangements for the past 2 months: Single Family Home                                       Social Determinants of Health (SDOH) Interventions    Readmission Risk Interventions No flowsheet data found.

## 2020-11-15 NOTE — Progress Notes (Signed)
   11/15/20 0950  Urine Characteristics  Urinary Incontinence Yes  Urine Color Amber  Urine Appearance Clear  Urinary Interventions Bladder scan;Intermittent/Straight cath  Bladder Scan Volume (mL) 525 mL  Intermittent/Straight Cath (mL) 900 mL  Pt complaining of needing to void but unable to. Bladder scan of 525. In and out cath performed 923mL retrieved. PER MD place foley cath if Pt still unable to void.

## 2020-11-16 DIAGNOSIS — A419 Sepsis, unspecified organism: Secondary | ICD-10-CM | POA: Diagnosis not present

## 2020-11-16 DIAGNOSIS — M86172 Other acute osteomyelitis, left ankle and foot: Secondary | ICD-10-CM | POA: Diagnosis not present

## 2020-11-16 DIAGNOSIS — Z515 Encounter for palliative care: Secondary | ICD-10-CM | POA: Diagnosis not present

## 2020-11-16 DIAGNOSIS — Z7189 Other specified counseling: Secondary | ICD-10-CM | POA: Diagnosis not present

## 2020-11-16 LAB — CBC
HCT: 30.5 % — ABNORMAL LOW (ref 39.0–52.0)
Hemoglobin: 10 g/dL — ABNORMAL LOW (ref 13.0–17.0)
MCH: 31.7 pg (ref 26.0–34.0)
MCHC: 32.8 g/dL (ref 30.0–36.0)
MCV: 96.8 fL (ref 80.0–100.0)
Platelets: 222 10*3/uL (ref 150–400)
RBC: 3.15 MIL/uL — ABNORMAL LOW (ref 4.22–5.81)
RDW: 18.1 % — ABNORMAL HIGH (ref 11.5–15.5)
WBC: 9.7 10*3/uL (ref 4.0–10.5)
nRBC: 0 % (ref 0.0–0.2)

## 2020-11-16 LAB — BASIC METABOLIC PANEL
Anion gap: 9 (ref 5–15)
BUN: 9 mg/dL (ref 8–23)
CO2: 24 mmol/L (ref 22–32)
Calcium: 8 mg/dL — ABNORMAL LOW (ref 8.9–10.3)
Chloride: 104 mmol/L (ref 98–111)
Creatinine, Ser: 0.69 mg/dL (ref 0.61–1.24)
GFR, Estimated: >60 mL/min (ref >60)
Glucose, Bld: 161 mg/dL — ABNORMAL HIGH (ref 70–99)
Potassium: 4 mmol/L (ref 3.5–5.1)
Sodium: 137 mmol/L (ref 135–145)

## 2020-11-16 LAB — GLUCOSE, CAPILLARY
Glucose-Capillary: 109 mg/dL — ABNORMAL HIGH (ref 70–99)
Glucose-Capillary: 238 mg/dL — ABNORMAL HIGH (ref 70–99)
Glucose-Capillary: 249 mg/dL — ABNORMAL HIGH (ref 70–99)
Glucose-Capillary: 263 mg/dL — ABNORMAL HIGH (ref 70–99)

## 2020-11-16 MED ORDER — PREGABALIN 25 MG PO CAPS
25.0000 mg | ORAL_CAPSULE | Freq: Every day | ORAL | Status: DC
Start: 2020-11-16 — End: 2020-11-29
  Administered 2020-11-16 – 2020-11-29 (×14): 25 mg via ORAL
  Filled 2020-11-16 (×14): qty 1
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
## 2020-11-16 NOTE — Progress Notes (Signed)
Progress Note  Patient Name: [name] Date of Encounter: 11/16/2020  Primary Cardiologist: Kirk Ruths, MD   Subjective   No CP or SOB. L AKA site painful but overall resting comfortably.  Inpatient Medications    Scheduled Meds: . amiodarone  200 mg Oral Daily  . apixaban  5 mg Oral BID  . atorvastatin  40 mg Oral Daily  . baclofen  5 mg Oral TID  . Chlorhexidine Gluconate Cloth  6 each Topical Daily  . cholecalciferol  5,000 Units Oral Q1200  . donepezil  5 mg Oral QHS  . feeding supplement (GLUCERNA SHAKE)  237 mL Oral TID BM  . ferrous sulfate  325 mg Oral BID WC  . finasteride  5 mg Oral Q1200  . furosemide  40 mg Oral Daily  . gabapentin  300 mg Oral TID  . Gerhardt's butt cream   Topical QID  . insulin aspart  0-15 Units Subcutaneous TID WC  . insulin aspart  0-5 Units Subcutaneous QHS  . insulin glargine  10 Units Subcutaneous QHS  . loratadine  5 mg Oral QHS  . losartan  25 mg Oral Daily  . memantine  5 mg Oral BID  . metoprolol succinate  50 mg Oral Daily  . pantoprazole  40 mg Oral Daily  . potassium chloride  20 mEq Oral Daily  . pregabalin  25 mg Oral Daily  . sodium chloride flush  10-40 mL Intracatheter Q12H   Continuous Infusions:  PRN Meds: acetaminophen, levalbuterol, ondansetron (ZOFRAN) IV, oxyCODONE-acetaminophen, sodium chloride flush   Vital Signs    Vitals:   11/15/20 1937 11/15/20 2313 11/16/20 0300 11/16/20 0800  BP: 129/81 (!) 136/91 (!) 149/99 (!) 141/90  Pulse: (!) 108 (!) 107 (!) 109 (!) 109  Resp: 17 19 20 20   Temp: 98.2 F (36.8 C) 97.7 F (36.5 C) 97.9 F (36.6 C) 98.1 F (36.7 C)  TempSrc: Oral Oral Oral Oral  SpO2: 95% 99% 97% 96%  Weight:      Height:        Intake/Output Summary (Last 24 hours) at 11/16/2020 1012 Last data filed at 11/16/2020 0300 Gross per 24 hour  Intake -  Output 850 ml  Net -850 ml   Filed Weights   10/25/20 1824 10/26/20 1850 10/27/20 0500  Weight: 116.1 kg 110.5 kg 110.3 kg     Telemetry    Atrial flutter rate 100-110- Personally Reviewed  ECG    No new - Personally Reviewed  Physical Exam   GEN: No acute distress.   Neck: No JVD Cardiac: regular rhythm, tachycardic, no murmurs, rubs, or gallops.  Respiratory: Clear to auscultation bilaterally. GI: Soft, nontender, non-distended  MS: No edema in R foot; L AKA Neuro:  Nonfocal  Psych: Normal affect   Labs    Chemistry Recent Labs  Lab 11/13/20 0553 11/14/20 0439 11/16/20 0250  NA 135 137 137  K 3.7 3.8 4.0  CL 104 103 104  CO2 25 24 24   GLUCOSE 146* 131* 161*  BUN 11 11 9   CREATININE 0.79 0.78 0.69  CALCIUM 7.7* 7.9* 8.0*  GFRNONAA >60 >60 >60  ANIONGAP 6 10 9      Hematology Recent Labs  Lab 11/14/20 0439 11/15/20 0534 11/16/20 0250  WBC 8.9 11.5* 9.7  RBC 3.13* 2.82* 3.15*  HGB 9.9* 8.9* 10.0*  HCT 30.4* 27.4* 30.5*  MCV 97.1 97.2 96.8  MCH 31.6 31.6 31.7  MCHC 32.6 32.5 32.8  RDW 18.6* 18.2* 18.1*  PLT 203 181 222    Cardiac EnzymesNo results for input(s): TROPONINI in the last 168 hours. No results for input(s): TROPIPOC in the last 168 hours.   BNPNo results for input(s): BNP, PROBNP in the last 168 hours.   DDimer No results for input(s): DDIMER in the last 168 hours.   Radiology    No results found.  Cardiac Studies    Patient Profile   70 y.o. male with a PMH of CAD s/p CABG in 2014, chronic combined CHF, paroxysmal atrial flutter, carotid artery disease with known right sided occlusion and moderate left stenosis, PVD, HTN, HLD, DM type 2, and CVA with left sided hemiparesis, who was initially seen for CHF and atrial flutter.  Echocardiogram this admission shows ejection fraction 25 to 30%, mild left ventricular enlargement, mild left ventricular hypertrophy, mild left atrial enlargement, mild mitral regurgitation.  EF worse compared to previous. He is s/p L AKA  Assessment & Plan   Principal Problem:   Sepsis --due to Infected Lt  Foot/Ostepmyleitis---POA Active Problems:   Atrial flutter (HCC)   Mixed hyperlipidemia   CAD, multiple vessel, 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   Essential hypertension   GERD (gastroesophageal reflux disease)   BPH (benign prostatic hyperplasia)   Acute osteomyelitis of toe, left (HCC)   Diabetic ulcer of left heel (HCC)   Fracture of left toe   Leukocytosis   Hypokalemia   Hypoalbuminemia   Hyperglycemia due to diabetes mellitus ([place])   History of stroke   PAD (peripheral artery disease) /SMA Stenosis/Rt and Lt SFA Stenosis/   Osteomyelitis of left foot ([place])   Cardiomyopathy ([place])   Palliative care by specialist   Goals of care, counseling/discussion    1. Chronic combined systolic/diastolic congestive heart failure - euvolemic, continue lasix 40 mg daily   2 atrial flutter-patient remains in atrial flutter.   - HR trend relatively stable.  - continue metoprolol succ 50 mg daily - continue amiodarone 200 mg daily - Eliquis resumed 5 mg BID  3 ischemic cardiomyopathy - medical management recommended by prior service, agree this seems appropriate.  - BB as above - losartan 25 mg daily. Uptitrate to 50 mg daily if needed for BP persistently above 140/90.   4 sepsis-status post AKA.  Continue antibiotics per primary care.   5 coronary artery disease-continue statin.  Discontinued Plavix.      For questions or updates, please contact [place] Please consult [email or web address] for contact info under        Signed, Elouise Munroe, MD  11/16/2020, 10:12 AM

## 2020-11-16 NOTE — Progress Notes (Signed)
Progress Note    [name]  [id number] DOB: 11-Jan-1951  DOA: 10/25/2020 PCP: Leeanne Rio, MD    Brief Narrative:    Medical records reviewed and are as summarized below:  [name] is an 70 y.o. male with hypertension HLD, T2DM, a flutter, CAD, history of a stroke with left hemiparesis admitted with left foot wound from recent trauma.   Pt was admitted on antibiotics at [facility], vascular surgery was consulted and transferred to [facility] 1/17-but patient refused amputation. Patient had complicated hospitalization with acute hypoxic respiratory failure acute on chronic systolic CHF.  Seen by cardiology and vascular.  Palliative care was consulted since he initially refused amputation. After family meeting with palliative care 11/04/20 patient and wife wanted to proceed with amputation-vascular surgery and cardiology were notified for preop ( due to new low lvef). But family decided against surgery on 1/26 eventually on 1/29 family reverted to full code and proceeded with L AKA 1/31. Working on placement.   Assessment/Plan:   Principal Problem:   Sepsis --due to Infected Lt Foot/Ostepmyleitis---POA Active Problems:   Atrial flutter (HCC)   Mixed hyperlipidemia   CAD, multiple vessel, 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   Essential hypertension   GERD (gastroesophageal reflux disease)   BPH (benign prostatic hyperplasia)   Acute osteomyelitis of toe, left (HCC)   Diabetic ulcer of left heel (HCC)   Fracture of left toe   Leukocytosis   Hypokalemia   Hypoalbuminemia   Hyperglycemia due to diabetes mellitus (HCC)   History of stroke   PAD (peripheral artery disease) /SMA Stenosis/Rt and Lt SFA Stenosis/   Osteomyelitis of left foot (HCC)   Cardiomyopathy ([place])   Palliative care by specialist   Goals of care, counseling/discussion    Severe sepsis POA due to left foot osteomyelitis/left foot osteomyelitis: 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 Linus Salmons-- okay to discontinue all antibiotics. 2/5 WBC remains normal, he is afebrile. Remains off Abx. Continue to monitor.   Febrile episode overnight 1/31-2/1: no further fevers -chest x-ray-no obvious pneumonia/UA on those noted positive but leuk negative and WBC 0-5, less likely UTI, blood culture NGTD -duplex negative for acute DVT but does have superficial thrombosis -on elqiuis 2/5 No recurrent fever.   Acute metabolic encephalopathy with lethargy/somnolence:multi-factorial- from post op/fever/pain medications/polypharmacy.  No hypercapnia on VBG.  Minimize opiates/oxycodone -appears resolved -needs mobilization and limitation of narcotics 2/5 Mentation is improving. Continue limitation of narcotics.   Acute blood loss anemia hemoglobin dropped to 8.7 g, monitor.  Suspect multifactorial blood loss anemia in the setting of surgery, limb loss and chronic disease. 2/5 Hgb stable at 10 though may have hemoconcentration effect, was 8.9 yesterday.  Chronic combined systolic/diastolic [id number] with EF 25 to 30%-which is new, previously 50% 2019, seen by cardiology-on Toprol and Lasix,no plan for invasive work-up per cards.  No evidence of fluid overload.  2/5 Cardiology following.   Ischemic cardiomyopathy/CAD-multivessel with history of CABG  12/2013/ hyperlipidemia/mildly elevated troponin subtle elevation flat trend nonischemic: Stable, continue Lipitor, Plavix, metoprolol. No further clinical work-up recommended.   PAF/a flutter with RVR : Rate fairly controlled on Toprol-being adjusted by cardiology, amiodarone.therapeutic  -eliquis  Acute hypoxic respiratory failure: post op resolved.  Insulin-dependent type 2 diabetes mellitus - stable HbA1c 6.9.  Stable, cont  Lantus, ssi . 2/5 Blood sugar trending up this afternoon, may consider low dose Lantus in am.  Hypokalemia/magnesium: -monitor 2/5 Potassium level normal today.   History of CVA  with left hemiparesis/PAD/HTN: Cont his statin/Toprol.  2/5 Plavix has been discontinued.   CKD stage II,at baseline.  Dementia:  Cont on Aricept/Namenda, minmize edatives, cont supportive care fall precaution.  GERD:Continue PPI  [id number] finasteride  [id number] care has been following very closely.After extensive family  W/ PMT 1/29 family wanted to proceed with amputation. And was changed to full code. Continue to monitor closely high risk of decompensation and readmission. Palliative care to follow along closely.  obesity Body mass index is 38.09 kg/m.  Poor overall prognosis   Family Communication/Anticipated D/C date and plan/Code Status   DVT prophylaxis: eliquis Code Status: Full Code.  Disposition Plan: Status is: Inpatient  Remains inpatient appropriate because:Inpatient level of care appropriate due to severity of illness   Dispo: The patient is from: Home              Anticipated d/c is to: SNF              Anticipated d/c date is: 2 days              Patient currently is not medically stable to d/c.- vascular and cardiology final recommendations   Difficult to place patient No             Family communication: His wife was at the bedside.         Medical Consultants:    Vascular  cards  Subjective:   Reports that his pain is improved this afternoon. Tolerating a diet well but did not like the chicken for lunch, reported as too tough, ate ice cream mostly.   Objective:    Vitals:   11/16/20 0300 11/16/20 0800 11/16/20 1100 11/16/20 1600  BP: (!) 149/99 (!) 141/90 124/70 97/67  Pulse: (!) 109 (!) 109 (!) 101 97  Resp: 20 20 20 16   Temp: 97.9 F (36.6 C) 98.1 F (36.7 C) 98.9 F (37.2 C) 97.9 F (36.6 C)  TempSrc: Oral Oral Oral Oral  SpO2: 97% 96% 99% 93%  Weight:      Height:        Intake/Output Summary (Last 24 hours) at 11/16/2020 1746 Last data filed at 11/16/2020 0300 Gross per 24 hour  Intake -  Output 850 ml   Net -850 ml   Filed Weights   10/25/20 1824 10/26/20 1850 10/27/20 0500  Weight: 116.1 kg 110.5 kg 110.3 kg    Exam:  General: Appearance:    Obese male who appears chronically ill     Lungs:     respirations unlabored. No cough.   Heart:    Normal heart rate. Normal rhythm. No murmurs, rubs, or gallops.   MS:   Left aka amputation noted. No active bleeding.   Neurologic:   Awake, alert, oriented x 3. No apparent focal neurological           defect.      Data Reviewed:   I have personally reviewed following labs and imaging studies:  Labs: Labs show the following:   Basic Metabolic Panel: Recent Labs  Lab 11/11/20 0430 11/12/20 0400 11/13/20 0553 11/14/20 0439 11/16/20 0250  NA 138 136 135 137 137  K 3.8 4.2 3.7 3.8 4.0  CL 106 103 104 103 104  CO2 26 25 25 24 24   GLUCOSE 139* 223* 146* 131* 161*  BUN 9 8 11 11 9   CREATININE 0.86 0.90 0.79 0.78 0.69  CALCIUM 8.0*  8.0* 7.7* 7.9* 8.0*   GFR Estimated Creatinine Clearance: 104.8 mL/min (by C-G formula based on SCr of 0.69 mg/dL). Liver Function Tests: No results for input(s): AST, ALT, ALKPHOS, BILITOT, PROT, ALBUMIN in the last 168 hours. No results for input(s): LIPASE, AMYLASE in the last 168 hours. No results for input(s): AMMONIA in the last 168 hours. Coagulation profile Recent Labs  Lab 11/11/20 0430  INR 1.2    CBC: Recent Labs  Lab 11/12/20 0400 11/13/20 0553 11/14/20 0439 11/15/20 0534 11/16/20 0250  WBC 14.4* 11.0* 8.9 11.5* 9.7  HGB 10.0* 8.7* 9.9* 8.9* 10.0*  HCT 31.9* 28.2* 30.4* 27.4* 30.5*  MCV 98.2 99.3 97.1 97.2 96.8  PLT 245 183 203 181 222   Cardiac Enzymes: Recent Labs  Lab 11/14/20 0439  CKTOTAL 134   BNP (last 3 results) No results for input(s): PROBNP in the last 8760 hours. CBG: Recent Labs  Lab 11/15/20 1629 11/15/20 2132 11/16/20 0808 11/16/20 1244 11/16/20 1625  GLUCAP 204* 174* 109* 238* 249*   D-Dimer: No results for input(s): DDIMER in the last 72  hours. Hgb A1c: No results for input(s): HGBA1C in the last 72 hours. Lipid Profile: No results for input(s): CHOL, HDL, LDLCALC, TRIG, CHOLHDL, LDLDIRECT in the last 72 hours. Thyroid function studies: No results for input(s): TSH, T4TOTAL, T3FREE, THYROIDAB in the last 72 hours.  Invalid input(s): FREET3 Anemia work up: No results for input(s): VITAMINB12, FOLATE, FERRITIN, TIBC, IRON, RETICCTPCT in the last 72 hours. Sepsis Labs: Recent Labs  Lab 11/13/20 0553 11/14/20 0439 11/15/20 0534 11/16/20 0250  WBC 11.0* 8.9 11.5* 9.7    Microbiology Recent Results (from the past 240 hour(s))  SARS Coronavirus 2 by RT PCR (hospital order, performed in [facility] hospital lab) Nasopharyngeal Nasopharyngeal Swab     Status: None   Collection Time: 11/10/20  1:04 PM   Specimen: Nasopharyngeal Swab  Result Value Ref Range Status   SARS Coronavirus 2 NEGATIVE NEGATIVE Final    Comment: (NOTE) SARS-CoV-2 target nucleic acids are NOT DETECTED.  The SARS-CoV-2 RNA is generally detectable in upper and lower respiratory specimens during the acute phase of infection. The lowest concentration of SARS-CoV-2 viral copies this assay can detect is 250 copies / mL. A negative result does not preclude SARS-CoV-2 infection and should not be used as the sole basis for treatment or other patient management decisions.  A negative result may occur with improper specimen collection / handling, submission of specimen other than nasopharyngeal swab, presence of viral mutation(s) within the areas targeted by this assay, and inadequate number of viral copies (<250 copies / mL). A negative result must be combined with clinical observations, patient history, and epidemiological information.  Fact Sheet for Patients:   StrictlyIdeas.no  Fact Sheet for Healthcare Providers: BankingDealers.co.za  This test is not yet approved or  cleared by the [place]  FDA and has been authorized for detection and/or diagnosis of SARS-CoV-2 by FDA under an Emergency Use Authorization (EUA).  This EUA will remain in effect (meaning this test can be used) for the duration of the COVID-19 declaration under Section 564(b)(1) of the Act, 21 U.S.C. section 360bbb-3(b)(1), unless the authorization is terminated or revoked sooner.  Performed at [facility], [place] [address], [place], [place] [address]   Culture, blood (routine x 2)     Status: None (Preliminary result)   Collection Time: 11/12/20  8:31 AM   Specimen: BLOOD RIGHT HAND  Result Value Ref Range Status  Specimen Description BLOOD RIGHT HAND  Final   Special Requests AEROBIC BOTTLE ONLY Blood Culture adequate volume  Final   Culture   Final    NO GROWTH 4 DAYS Performed at [facility], [address] [place], [place] [address]    Report Status PENDING  Incomplete  Culture, blood (routine x 2)     Status: None (Preliminary result)   Collection Time: 11/12/20  8:31 AM   Specimen: BLOOD RIGHT HAND  Result Value Ref Range Status   Specimen Description BLOOD RIGHT HAND  Final   Special Requests AEROBIC BOTTLE ONLY Blood Culture adequate volume  Final   Culture   Final    NO GROWTH 4 DAYS Performed at [facility], [place] [address] [place], [place] [address]    Report Status PENDING  Incomplete    Procedures and diagnostic studies:  No results found.  Medications:   . amiodarone  200 mg Oral Daily  . apixaban  5 mg Oral BID  . atorvastatin  40 mg Oral Daily  . baclofen  5 mg Oral TID  . Chlorhexidine Gluconate Cloth  6 each Topical Daily  . cholecalciferol  5,000 Units Oral Q1200  . donepezil  5 mg Oral QHS  . feeding supplement (GLUCERNA SHAKE)  237 mL Oral TID BM  . ferrous sulfate  325 mg Oral BID WC  . finasteride  5 mg Oral Q1200  . furosemide  40 mg Oral Daily  . gabapentin  300 mg Oral TID  . Gerhardt's butt cream   Topical QID  . insulin aspart  0-15  Units Subcutaneous TID WC  . insulin aspart  0-5 Units Subcutaneous QHS  . insulin glargine  10 Units Subcutaneous QHS  . loratadine  5 mg Oral QHS  . losartan  25 mg Oral Daily  . memantine  5 mg Oral BID  . metoprolol succinate  50 mg Oral Daily  . pantoprazole  40 mg Oral Daily  . potassium chloride  20 mEq Oral Daily  . pregabalin  25 mg Oral Daily  . sodium chloride flush  10-40 mL Intracatheter Q12H   Continuous Infusions:   LOS: 22 days   Blain Pais, MD [facility]   How to contact the [facility] Attending or Consulting provider [place] or covering provider during after hours [place], for this patient?  1. Check the care team in [facility] and look for a) attending/consulting [facility] provider listed and b) the [facility] team listed 2. Log into [email or web address] and use 's universal password to access. If you do not have the password, please contact the hospital operator. 3. Locate the [facility] provider you are looking for under [facility] and page to a number that you can be directly reached. 4. If you still have difficulty reaching the provider, please page the [facility] (Director on Call) for the Hospitalists listed on amion for assistance.  11/16/2020, 5:46 PM

## 2020-11-17 DIAGNOSIS — Z7189 Other specified counseling: Secondary | ICD-10-CM | POA: Diagnosis not present

## 2020-11-17 DIAGNOSIS — Z515 Encounter for palliative care: Secondary | ICD-10-CM | POA: Diagnosis not present

## 2020-11-17 DIAGNOSIS — A419 Sepsis, unspecified organism: Secondary | ICD-10-CM | POA: Diagnosis not present

## 2020-11-17 LAB — GLUCOSE, CAPILLARY
Glucose-Capillary: 123 mg/dL — ABNORMAL HIGH (ref 70–99)
Glucose-Capillary: 215 mg/dL — ABNORMAL HIGH (ref 70–99)
Glucose-Capillary: 179 mg/dL — ABNORMAL HIGH (ref 70–99)

## 2020-11-17 LAB — CULTURE, BLOOD (ROUTINE X 2)
Culture: NO GROWTH
Culture: NO GROWTH
Special Requests: ADEQUATE
Special Requests: ADEQUATE

## 2020-11-17 MED ORDER — OXYCODONE HCL 5 MG PO TABS
10.0000 mg | ORAL_TABLET | Freq: Four times a day (QID) | ORAL | Status: DC | PRN
  Administered 2020-11-17 – 2020-11-29 (×31): 10 mg via ORAL
  Filled 2020-11-17 (×36): qty 2
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
## 2020-11-17 NOTE — Progress Notes (Signed)
Progress Note    [name]  GLO:[id number] DOB: 10/19/1950  DOA: 10/25/2020 PCP: Leeanne Rio, MD    Brief Narrative:    Medical records reviewed and are as summarized below:  [name] is an 70 y.o. male with hypertension HLD, T2DM, a flutter, CAD, history of a stroke with left hemiparesis admitted with left foot wound from recent trauma.   Pt was admitted on antibiotics at [facility], vascular surgery was consulted and transferred to [facility] 1/17-but patient refused amputation. Patient had complicated hospitalization with acute hypoxic respiratory failure acute on chronic systolic CHF.  Seen by cardiology and vascular.  Palliative care was consulted since he initially refused amputation. After family meeting with palliative care 11/04/20 patient and wife wanted to proceed with amputation-vascular surgery and cardiology were notified for preop ( due to new low lvef). But family decided against surgery on 1/26 eventually on 1/29 family reverted to full code and proceeded with L AKA 1/31. Working on placement.   Assessment/Plan:   Principal Problem:   Sepsis --due to Infected Lt Foot/Ostepmyleitis---POA Active Problems:   Atrial flutter (HCC)   Mixed hyperlipidemia   CAD, multiple vessel, 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   Essential hypertension   GERD (gastroesophageal reflux disease)   BPH (benign prostatic hyperplasia)   Acute osteomyelitis of toe, left (HCC)   Diabetic ulcer of left heel (HCC)   Fracture of left toe   Leukocytosis   Hypokalemia   Hypoalbuminemia   Hyperglycemia due to diabetes mellitus (HCC)   History of stroke   PAD (peripheral artery disease) /SMA Stenosis/Rt and Lt SFA Stenosis/   Osteomyelitis of left foot (HCC)   Cardiomyopathy ([place])   Palliative care by specialist   Goals of care, counseling/discussion    Severe sepsis POA due to left foot osteomyelitis/left foot osteomyelitis: 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 Linus Salmons-- okay to discontinue all antibiotics. 2/5 WBC remains normal, he is afebrile. Remains off Abx. Continue to monitor.  2/6 Sepsis has resolved, remains afebrile off Abx. Pain management being adjusted today by Palliative care, appreciate their recommendations.   Febrile episode overnight 1/31-2/1: no further fevers -chest x-ray-no obvious pneumonia/UA on those noted positive but leuk negative and WBC 0-5, less likely UTI, blood culture NGTD -duplex negative for acute DVT but does have superficial thrombosis -on elqiuis 2/5 No recurrent fever.  2/6 Remains afebrile.   Acute metabolic encephalopathy with lethargy/somnolence:multi-factorial- from post op/fever/pain medications/polypharmacy.  No hypercapnia on VBG.  Minimize opiates/oxycodone -appears resolved -needs mobilization and limitation of narcotics 2/5 Mentation is improving. Continue limitation of narcotics.  2/6 Mentation has improved but he reports that his pain is not as well controlled, Palliative care following, increasing his pain medication. Continue monitoring closely.   Acute blood loss anemia hemoglobin dropped to 8.7 g, monitor.  Suspect multifactorial blood loss anemia in the setting of surgery, limb loss and chronic disease. 2/5 Hgb stable at 10 though may have hemoconcentration effect, was 8.9 yesterday. 2/6 No active bleeding.   Chronic combined systolic/diastolic JOA:CZYS with EF 25 to 30%-which is new, previously 50% 2019, seen by cardiology-on Toprol and Lasix,no plan for invasive work-up per cards.  No evidence of fluid overload.  2/5 Cardiology following.   Ischemic cardiomyopathy/CAD-multivessel with history of CABG  12/2013/ hyperlipidemia/mildly elevated troponin subtle elevation flat trend nonischemic: Stable, continue Lipitor, metoprolol. No further clinical work-up recommended. 2/6 Plavix has been discontinued  by Cards. He is on Eliquis now.    PAF/a flutter with RVR :  Rate fairly controlled on Toprol-being adjusted by cardiology, amiodarone.therapeutic  -eliquis  Acute hypoxic respiratory failure: post op resolved.  Insulin-dependent type 2 diabetes mellitus - stable HbA1c 6.9.  Stable, cont  Lantus, ssi . 2/5 Blood sugar trending up this afternoon, may consider low dose Lantus in am.  2/6 FBS stable, will continue monitoring.   Hypokalemia/magnesium: -monitor 2/5 Potassium level normal today.   History of CVA with left hemiparesis/PAD/HTN: Cont his statin/Toprol.  2/5 Plavix has been discontinued.   CKD stage II,at baseline.  Dementia:  Cont on Aricept/Namenda, minmize edatives, cont supportive care fall precaution.  GERD:Continue PPI  [id number] finasteride  [id number] care has been following very closely.After extensive family  W/ PMT 1/29 family wanted to proceed with amputation. And was changed to full code. Continue to monitor closely high risk of decompensation and readmission. Palliative care to follow along closely.  obesity Body mass index is 38.09 kg/m.  Poor overall prognosis   Family Communication/Anticipated D/C date and plan/Code Status   DVT prophylaxis: eliquis Code Status: Full Code.  Disposition Plan: Status is: Inpatient  Remains inpatient appropriate because:Inpatient level of care appropriate due to severity of illness   Dispo: The patient is from: Home              Anticipated d/c is to: SNF              Anticipated d/c date is: 2 days              Patient currently is not medically stable to d/c.- vascular and cardiology final recommendations   Difficult to place patient No             Family communication: His wife was at the bedside today.         Medical Consultants:    Vascular  Palliative care  cards  Subjective:   His pain is improving with the adjust pain medications. He is tolerating po intake well. Denies N/V, abdominal discomfort. Eager to go to [employer].     Objective:    Vitals:   11/17/20 0400 11/17/20 0738 11/17/20 1216 11/17/20 1604  BP: (!) 140/96 (!) 137/91 112/68 127/81  Pulse: (!) 110 (!) 110 (!) 108 (!) 110  Resp: 15 20 20 20   Temp: 98.3 F (36.8 C) 97.6 F (36.4 C) 98 F (36.7 C) 98.4 F (36.9 C)  TempSrc: Oral     SpO2: 99% 97% 92% 97%  Weight:      Height:        Intake/Output Summary (Last 24 hours) at 11/17/2020 1746 Last data filed at 11/17/2020 1201 Gross per 24 hour  Intake 590 ml  Output 1100 ml  Net -510 ml   Filed Weights   10/25/20 1824 10/26/20 1850 10/27/20 0500  Weight: 116.1 kg 110.5 kg 110.3 kg    Exam:  General: Appearance:    Obese male who appears chronically ill     Lungs:     respirations unlabored. No cough.   Heart:    Tachycardic. Normal rhythm. No murmurs, rubs, or gallops.   MS:   Left aka amputation noted. No active bleeding.   Neurologic:   Awake, alert, oriented x 3. No apparent focal neurological           defect.      Data Reviewed:   I have personally reviewed following labs and imaging studies:  Labs:  Labs show the following:   Basic Metabolic Panel: Recent Labs  Lab 11/11/20 0430 11/12/20 0400 11/13/20 0553 11/14/20 0439 11/16/20 0250  NA 138 136 135 137 137  K 3.8 4.2 3.7 3.8 4.0  CL 106 103 104 103 104  CO2 26 25 25 24 24   GLUCOSE 139* 223* 146* 131* 161*  BUN 9 8 11 11 9   CREATININE 0.86 0.90 0.79 0.78 0.69  CALCIUM 8.0* 8.0* 7.7* 7.9* 8.0*   GFR Estimated Creatinine Clearance: 104.8 mL/min (by C-G formula based on SCr of 0.69 mg/dL). Liver Function Tests: No results for input(s): AST, ALT, ALKPHOS, BILITOT, PROT, ALBUMIN in the last 168 hours. No results for input(s): LIPASE, AMYLASE in the last 168 hours. No results for input(s): AMMONIA in the last 168 hours. Coagulation profile Recent Labs  Lab 11/11/20 0430  INR 1.2    CBC: Recent Labs  Lab 11/12/20 0400 11/13/20 0553 11/14/20 0439 11/15/20 0534 11/16/20 0250  WBC 14.4* 11.0* 8.9  11.5* 9.7  HGB 10.0* 8.7* 9.9* 8.9* 10.0*  HCT 31.9* 28.2* 30.4* 27.4* 30.5*  MCV 98.2 99.3 97.1 97.2 96.8  PLT 245 183 203 181 222   Cardiac Enzymes: Recent Labs  Lab 11/14/20 0439  CKTOTAL 134   BNP (last 3 results) No results for input(s): PROBNP in the last 8760 hours. CBG: Recent Labs  Lab 11/16/20 1244 11/16/20 1625 11/16/20 2156 11/17/20 0738 11/17/20 1600  GLUCAP 238* 249* 263* 123* 215*   D-Dimer: No results for input(s): DDIMER in the last 72 hours. Hgb A1c: No results for input(s): HGBA1C in the last 72 hours. Lipid Profile: No results for input(s): CHOL, HDL, LDLCALC, TRIG, CHOLHDL, LDLDIRECT in the last 72 hours. Thyroid function studies: No results for input(s): TSH, T4TOTAL, T3FREE, THYROIDAB in the last 72 hours.  Invalid input(s): FREET3 Anemia work up: No results for input(s): VITAMINB12, FOLATE, FERRITIN, TIBC, IRON, RETICCTPCT in the last 72 hours. Sepsis Labs: Recent Labs  Lab 11/13/20 0553 11/14/20 0439 11/15/20 0534 11/16/20 0250  WBC 11.0* 8.9 11.5* 9.7    Microbiology Recent Results (from the past 240 hour(s))  SARS Coronavirus 2 by RT PCR (hospital order, performed in [facility] hospital lab) Nasopharyngeal Nasopharyngeal Swab     Status: None   Collection Time: 11/10/20  1:04 PM   Specimen: Nasopharyngeal Swab  Result Value Ref Range Status   SARS Coronavirus 2 NEGATIVE NEGATIVE Final    Comment: (NOTE) SARS-CoV-2 target nucleic acids are NOT DETECTED.  The SARS-CoV-2 RNA is generally detectable in upper and lower respiratory specimens during the acute phase of infection. The lowest concentration of SARS-CoV-2 viral copies this assay can detect is 250 copies / mL. A negative result does not preclude SARS-CoV-2 infection and should not be used as the sole basis for treatment or other patient management decisions.  A negative result may occur with improper specimen collection / handling, submission of specimen other than  nasopharyngeal swab, presence of viral mutation(s) within the areas targeted by this assay, and inadequate number of viral copies (<250 copies / mL). A negative result must be combined with clinical observations, patient history, and epidemiological information.  Fact Sheet for Patients:   StrictlyIdeas.no  Fact Sheet for Healthcare Providers: BankingDealers.co.za  This test is not yet approved or  cleared by the [place] FDA and has been authorized for detection and/or diagnosis of SARS-CoV-2 by FDA under an Emergency Use Authorization (EUA).  This EUA will remain in effect (meaning this test can be used)  for the duration of the COVID-19 declaration under Section 564(b)(1) of the Act, 21 U.S.C. section 360bbb-3(b)(1), unless the authorization is terminated or revoked sooner.  Performed at [facility], [place] [address] [place], [place] [address]   Culture, blood (routine x 2)     Status: None   Collection Time: 11/12/20  8:31 AM   Specimen: BLOOD RIGHT HAND  Result Value Ref Range Status   Specimen Description BLOOD RIGHT HAND  Final   Special Requests AEROBIC BOTTLE ONLY Blood Culture adequate volume  Final   Culture   Final    NO GROWTH 5 DAYS Performed at [facility], [place] [address] [place], [place] [address]    Report Status 11/17/2020 FINAL  Final  Culture, blood (routine x 2)     Status: None   Collection Time: 11/12/20  8:31 AM   Specimen: BLOOD RIGHT HAND  Result Value Ref Range Status   Specimen Description BLOOD RIGHT HAND  Final   Special Requests AEROBIC BOTTLE ONLY Blood Culture adequate volume  Final   Culture   Final    NO GROWTH 5 DAYS Performed at [facility], [place] [address] [place],  [address]    Report Status 11/17/2020 FINAL  Final    Procedures and diagnostic studies:  No results found.  Medications:   . amiodarone  200 mg Oral Daily  . apixaban  5 mg Oral BID  .  atorvastatin  40 mg Oral Daily  . baclofen  5 mg Oral TID  . Chlorhexidine Gluconate Cloth  6 each Topical Daily  . cholecalciferol  5,000 Units Oral Q1200  . donepezil  5 mg Oral QHS  . feeding supplement (GLUCERNA SHAKE)  237 mL Oral TID BM  . ferrous sulfate  325 mg Oral BID WC  . finasteride  5 mg Oral Q1200  . furosemide  40 mg Oral Daily  . gabapentin  300 mg Oral TID  . Gerhardt's butt cream   Topical QID  . insulin aspart  0-15 Units Subcutaneous TID WC  . insulin aspart  0-5 Units Subcutaneous QHS  . insulin glargine  10 Units Subcutaneous QHS  . loratadine  5 mg Oral QHS  . losartan  25 mg Oral Daily  . memantine  5 mg Oral BID  . metoprolol succinate  50 mg Oral Daily  . pantoprazole  40 mg Oral Daily  . potassium chloride  20 mEq Oral Daily  . pregabalin  25 mg Oral Daily  . sodium chloride flush  10-40 mL Intracatheter Q12H   Continuous Infusions:   LOS: 23 days   Blain Pais, MD [facility]   How to contact the [facility] Attending or Consulting provider [place] or covering provider during after hours [place], for this patient?  1. Check the care team in [facility] and look for a) attending/consulting TR[facility] provider listed and b) the [facility] team listed 2. Log into [email or web address] and use [place]'s universal password to access. If you do not have the password, please contact the hospital operator. 3. Locate the [facility] provider you are looking for under [facility] and page to a number that you can be directly reached. 4. If you still have difficulty reaching the provider, please page the [facility] (Director on Call) for the Hospitalists listed on amion for assistance.  11/17/2020, 5:46 PM

## 2020-11-18 DIAGNOSIS — N400 Enlarged prostate without lower urinary tract symptoms: Secondary | ICD-10-CM | POA: Diagnosis not present

## 2020-11-18 DIAGNOSIS — I483 Typical atrial flutter: Secondary | ICD-10-CM | POA: Diagnosis not present

## 2020-11-18 DIAGNOSIS — I2510 Atherosclerotic heart disease of native coronary artery without angina pectoris: Secondary | ICD-10-CM | POA: Diagnosis not present

## 2020-11-18 DIAGNOSIS — M86172 Other acute osteomyelitis, left ankle and foot: Secondary | ICD-10-CM | POA: Diagnosis not present

## 2020-11-18 LAB — GLUCOSE, CAPILLARY
Glucose-Capillary: 189 mg/dL — ABNORMAL HIGH (ref 70–99)
Glucose-Capillary: 125 mg/dL — ABNORMAL HIGH (ref 70–99)
Glucose-Capillary: 196 mg/dL — ABNORMAL HIGH (ref 70–99)
Glucose-Capillary: 288 mg/dL — ABNORMAL HIGH (ref 70–99)
Glucose-Capillary: 156 mg/dL — ABNORMAL HIGH (ref 70–99)

## 2020-11-18 LAB — CBC
HCT: 34.4 % — ABNORMAL LOW (ref 39.0–52.0)
Hemoglobin: 10.7 g/dL — ABNORMAL LOW (ref 13.0–17.0)
MCH: 30.3 pg (ref 26.0–34.0)
MCHC: 31.1 g/dL (ref 30.0–36.0)
MCV: 97.5 fL (ref 80.0–100.0)
Platelets: 269 10*3/uL (ref 150–400)
RBC: 3.53 MIL/uL — ABNORMAL LOW (ref 4.22–5.81)
RDW: 17.9 % — ABNORMAL HIGH (ref 11.5–15.5)
WBC: 10.6 10*3/uL — ABNORMAL HIGH (ref 4.0–10.5)
nRBC: 0 % (ref 0.0–0.2)

## 2020-11-18 LAB — SARS CORONAVIRUS 2 (TAT 6-24 HRS): SARS Coronavirus 2: POSITIVE — AB

## 2020-11-18 NOTE — Evaluation (Signed)
Physical Therapy Evaluation Patient Details Name: [name] MRN: [id number] DOB: 31-May-1951 Today's Date: 11/18/2020   History of Present Illness  70 y.o. male with a PMH of CAD s/p CABG in 2014,  CHF, paroxysmal atrial flutter,  PVD, HTN, HLD, DM type 2, and CVA with left sided hemiparesis, who was initially seen for hypoxic respiratory failure from CHF. Pt also with L foot osteomyelitis and developed sepsis. He is s/p L AKA.  Clinical Impression  Pt admitted secondary to problem above with deficits below. Pt requiring max A +2 to sit EOB and to perform lateral scoots along EOB. Discussed desensitization techniques to help with phantom pain. Currently recommending SNF level therapies at d/c to increase independence and safety with mobility. Will continue to follow acutely.     Follow Up Recommendations SNF;Supervision/Assistance - 24 hour    Equipment Recommendations  Other (comment) (hoyer lift and pad)    Recommendations for Other Services       Precautions / Restrictions Precautions Precautions: Fall Restrictions Weight Bearing Restrictions: No      Mobility  Bed Mobility Overal bed mobility: Needs Assistance Bed Mobility: Supine to Sit;Sit to Supine     Supine to sit: Max assist;+2 for physical assistance Sit to supine: Max assist;+2 for physical assistance   General bed mobility comments: Required assist for trunk and LE assist. Also required assist to assist with LUE management and scoot hips to EOB. Initially required mod A for sittting balance, but able to progress to min guard A.    Transfers Overall transfer level: Needs assistance Equipment used: 2 person hand held assist Transfers: Lateral/Scoot Transfers          Lateral/Scoot Transfers: Max assist;+2 physical assistance General transfer comment: Performed lateral scoot along EOB with max A +2. Cues to push through RLE to assist with scooting.  Ambulation/Gait                Stairs             Wheelchair Mobility    Modified Rankin (Stroke Patients Only)       Balance Overall balance assessment: Needs assistance Sitting-balance support: Single extremity supported;Feet supported Sitting balance-Leahy Scale: Poor Sitting balance - Comments: Initially requiring mod A to maintain sitting balance, but progressed to min guard with use of RUE for support                                     Pertinent Vitals/Pain Pain Assessment: 0-10 Pain Score: 10-Worst pain ever Pain Location: Residual limb, Phantom pain Pain Descriptors / Indicators: Aching Pain Intervention(s): Monitored during session;Limited activity within patient's tolerance;Repositioned    Home Living Family/patient expects to be discharged to:: Private residence Living Arrangements: Spouse/significant other Available Help at Discharge: Family;Available PRN/intermittently Type of Home: House Home Access: Stairs to enter Entrance Stairs-Rails: None Entrance Stairs-Number of Steps: 1 Home Layout: One level Home Equipment: Walker - 2 wheels;Wheelchair - Press photographer;Shower seat      Prior Function Level of Independence: Needs assistance   Gait / Transfers Assistance Needed: household ambulator using RW, uses Transport planner for longer distances,  ADL's / Homemaking Assistance Needed: Wife assisted with bathing/dressing. Wife also performed IADLs.        Hand Dominance   Dominant Hand: Left    Extremity/Trunk Assessment   Upper Extremity Assessment Upper Extremity Assessment: Defer to OT evaluation    Lower Extremity  Assessment Lower Extremity Assessment: LLE deficits/detail LLE Deficits / Details: s/p L AKA. Weakness at baseline at hip secondary to CVA. Unable to perform SLR in supine.    Cervical / Trunk Assessment Cervical / Trunk Assessment: Normal  Communication   Communication: Other (comment) (mild slurring of speech)  Cognition Arousal/Alertness:  Awake/alert Behavior During Therapy: WFL for tasks assessed/performed Overall Cognitive Status: No family/caregiver present to determine baseline cognitive functioning                                 General Comments: Unable to state the month or year. States he is at [employer]. Does have hx of CVA      General Comments      Exercises     Assessment/Plan    PT Assessment Patient needs continued PT services  PT Problem List Decreased strength;Decreased range of motion;Decreased activity tolerance;Decreased balance;Decreased mobility;Decreased knowledge of use of DME;Decreased knowledge of precautions;Decreased cognition       PT Treatment Interventions DME instruction;Gait training;Stair training;Functional mobility training;Therapeutic activities;Therapeutic exercise;Balance training;Patient/family education;Cognitive remediation    PT Goals (Current goals can be found in the Care Plan section)  Acute Rehab PT Goals Patient Stated Goal: to be able to move better PT Goal Formulation: With patient Time For Goal Achievement: 12/02/20 Potential to Achieve Goals: Good    Frequency Min 2X/week   Barriers to discharge        Co-evaluation PT/OT/SLP Co-Evaluation/Treatment: Yes Reason for Co-Treatment: Complexity of the patient's impairments (multi-system involvement);Necessary to address cognition/behavior during functional activity;For patient/therapist safety PT goals addressed during session: Mobility/safety with mobility;Balance         AM-PAC PT "6 Clicks" Mobility  Outcome Measure Help needed turning from your back to your side while in a flat bed without using bedrails?: A Lot Help needed moving from lying on your back to sitting on the side of a flat bed without using bedrails?: A Lot Help needed moving to and from a bed to a chair (including a wheelchair)?: Total Help needed standing up from a chair using your arms (e.g., wheelchair or bedside  chair)?: Total Help needed to walk in hospital room?: Total Help needed climbing 3-5 steps with a railing? : Total 6 Click Score: 8    End of Session   Activity Tolerance: Patient tolerated treatment well Patient left: in bed;with call bell/phone within reach;with bed alarm set Nurse Communication: Mobility status PT Visit Diagnosis: Unsteadiness on feet (R26.81);Muscle weakness (generalized) (M62.81);Difficulty in walking, not elsewhere classified (R26.2)    Time: 6440-3474 PT Time Calculation (min) (ACUTE ONLY): 20 min   Charges:   PT Evaluation $PT Eval Moderate Complexity: 1 Mod          Reuel Derby, PT, DPT  Acute Rehabilitation Services  Pager: [phone] Office: [phone]   Rudean Hitt 11/18/2020, 3:04 PM

## 2020-11-18 NOTE — Plan of Care (Signed)
  Problem: Education: Goal: Knowledge of General Education information will improve Description Including pain rating scale, medication(s)/side effects and non-pharmacologic comfort measures Outcome: Progressing   Problem: Health Behavior/Discharge Planning: Goal: Ability to manage health-related needs will improve Outcome: Progressing   

## 2020-11-18 NOTE — Progress Notes (Signed)
[facility]                                                                              Patient Demographics  [name], is a 70 y.o. male, DOB - 1950-12-18, [id number]  Admit date - 10/25/2020   Admitting Physician Bernadette Hoit, DO  Outpatient Primary MD for the patient is Leeanne Rio, MD  Outpatient specialists:   LOS - 24  days   Medical records reviewed and are as summarized below:    Chief Complaint  Patient presents with  . Foot Pain  . Foot Ulcer       Brief summary   Patient is a 70 y.o. male with hypertension HLD, T2DM, a flutter, CAD, history of a stroke with left hemiparesis admitted with left foot wound from recent trauma.[facility] and transferred to [facility] 1/17-but patient refused amputation. Patient had complicated hospitalization with acute hypoxic respiratory failure, acute on chronic systolic CHF. Seen by cardiology and vascular. Palliative care was consulted since he initially refused amputation. After family meeting with palliative care on 11/04/20,patient and wife wanted to proceed with amputation-vascular surgery and cardiology were notified for preop ( due to new low EF).But familydecided against surgeryon 1/26. Eventually on 1/29family reverted to full code and proceededwith L AKA on 1/31. SW is working on placement.   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 Dr Lupita Leash discussed with ID, Dr. Linus Salmons on 2/2, ok to discontinue oral antibiotics after AKA on 1/31 -Sepsis physiology resolved, remains off antibiotics.  No fevers, monitor WBC count, 10.6 -Continue pain management, palliative medicine assisting -1 febrile episode overnight on 2/1, blood cultures negative, duplex negative for acute DVT, has  superficial thrombosis right cephalic vein  Acute metabolic encephalopathy -Multifactorial, postop, multiple medical issues, polypharmacy -Currently improving, complaining of pain not well controlled.  Palliative medicine assisting with the pain management -Good sleep hygiene, avoid increasing sedatives or narcotics  Acute blood loss anemia -8.7 on 2/2, postop currently stable, hemoglobin 10.7  Chronic combined systolic, diastolic CHF -2D echo showed EF of 25 to 30%, new.  Cardiology was consulted. -Per cardiology, no plans for invasive work-up.   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 -Continue eliquis.  Plavix has been discontinued.  No further ischemic work-up -Continue Toprol, losartan, Lasix, eliquis  Acute respiratory failure with hypoxia -Postoperatively, currently resolved.  O2 sats 100% on room air  Diabetes mellitus type 2, IDDM -Hemoglobin A1c 6.9, CBGs fairly stable -Continue Lantus, SSI   History of CVA with left hemiparesis, PAD, hypertension -Plavix discontinued, currently on eliquis -No acute issues  Chronic kidney disease stage II -Currently at baseline   Dementia -Continue Aricept, Namenda, minimize sedatives, escalating pain regimen  GERD -  Continue PPI  BPH -Continue finasteride  Obesity Estimated body mass index is 38.09 kg/m as calculated from the following:   Height as of this encounter: 5\' 7"  (1.702 m).   Weight as of this encounter: 110.3 kg.   Code Status: full code  DVT Prophylaxis: apixaban (ELIQUIS) tablet 5 mg   Level of Care: Level of care: Telemetry Surgical Family Communication: Discussed all  imaging results, lab results, explained to the patient  Disposition Plan:     Status is: Inpatient  Remains inpatient appropriate because:Inpatient level of care appropriate due to severity of illness   Dispo: The patient is from: Home              Anticipated d/c is to: SNF              Anticipated d/c date is: > 3 days              Patient currently is medically stable to d/c.   Difficult to place patient     Time Spent in minutes    Procedures:    Consultants:   Cardiology  Palliative  Vascular   Antimicrobials:   Anti-infectives (From admission, onward)   Start     Dose/Rate Route Frequency Ordered Stop   11/07/20 1000  cefTRIAXone (ROCEPHIN) 2 g in sodium chloride 0.9 % 100 mL IVPB  Status:  Discontinued        2 g 200 mL/hr over 30 Minutes Intravenous Every 24 hours 11/07/20 0742 11/13/20 1343   11/07/20 1000  DAPTOmycin (CUBICIN) 670 mg in sodium chloride 0.9 % IVPB  Status:  Discontinued        670 mg 226.8 mL/hr over 30 Minutes Intravenous Daily 11/07/20 0746 11/13/20 1343   11/03/20 2200  metroNIDAZOLE (FLAGYL) tablet 500 mg  Status:  Discontinued        500 mg Oral Every 8 hours 11/03/20 1554 11/13/20 1343   11/03/20 2000  sulfamethoxazole-trimethoprim (BACTRIM DS) 800-160 MG per tablet 1 tablet  Status:  Discontinued        1 tablet Oral Every 12 hours 11/03/20 1554 11/07/20 0742   11/01/20 0915  cefTRIAXone (ROCEPHIN) 2 g in sodium chloride 0.9 % 100 mL IVPB  Status:  Discontinued        2 g 200 mL/hr over 30 Minutes Intravenous Every 24 hours 11/01/20 0816 11/03/20 1551   10/29/20 1400  vancomycin (VANCOREADY) IVPB 750 mg/150 mL  Status:  Discontinued        750 mg 150 mL/hr over 60 Minutes Intravenous Every 12 hours 10/29/20 1238 11/01/20 0816   10/28/20 1400  vancomycin (VANCOCIN) IVPB 1000 mg/200 mL premix  Status:  Discontinued        1,000 mg 200 mL/hr over 60 Minutes Intravenous Every 12 hours 10/28/20 1344 10/29/20 1238   10/28/20 0900   metroNIDAZOLE (FLAGYL) IVPB 500 mg  Status:  Discontinued        500 mg 100 mL/hr over 60 Minutes Intravenous Every 8 hours 10/28/20 0808 11/03/20 1551   10/26/20 1000  vancomycin (VANCOCIN) IVPB 1000 mg/200 mL premix  Status:  Discontinued        1,000 mg 200 mL/hr over 60 Minutes Intravenous Every 12 hours 10/25/20 2037 10/28/20 1344   10/25/20 2200  ceFEPIme (MAXIPIME) 2 g in sodium chloride 0.9 % 100 mL IVPB  Status:  Discontinued        2 g 200 mL/hr over 30 Minutes Intravenous Every 8 hours 10/25/20 2037  11/01/20 0816   10/25/20 2030  vancomycin (VANCOREADY) IVPB 2000 mg/400 mL        2,000 mg 200 mL/hr over 120 Minutes Intravenous NOW 10/25/20 2027 10/25/20 2248   10/25/20 1930  vancomycin (VANCOCIN) IVPB 1000 mg/200 mL premix  Status:  Discontinued        1,000 mg 200 mL/hr over 60 Minutes Intravenous  Once 10/25/20 1928 10/25/20 2136   10/25/20 1930  cefTRIAXone (ROCEPHIN) 2 g in sodium chloride 0.9 % 100 mL IVPB        2 g 200 mL/hr over 30 Minutes Intravenous  Once 10/25/20 1928 10/25/20 2040          Medications  Scheduled Meds: . amiodarone  200 mg Oral Daily  . apixaban  5 mg Oral BID  . atorvastatin  40 mg Oral Daily  . baclofen  5 mg Oral TID  . Chlorhexidine Gluconate Cloth  6 each Topical Daily  . cholecalciferol  5,000 Units Oral Q1200  . donepezil  5 mg Oral QHS  . feeding supplement (GLUCERNA SHAKE)  237 mL Oral TID BM  . ferrous sulfate  325 mg Oral BID WC  . finasteride  5 mg Oral Q1200  . furosemide  40 mg Oral Daily  . gabapentin  300 mg Oral TID  . Gerhardt's butt cream   Topical QID  . insulin aspart  0-15 Units Subcutaneous TID WC  . insulin aspart  0-5 Units Subcutaneous QHS  . insulin glargine  10 Units Subcutaneous QHS  . loratadine  5 mg Oral QHS  . losartan  25 mg Oral Daily  . memantine  5 mg Oral BID  . metoprolol succinate  50 mg Oral Daily  . pantoprazole  40 mg Oral Daily  . potassium chloride  20 mEq Oral Daily  . pregabalin  25  mg Oral Daily  . sodium chloride flush  10-40 mL Intracatheter Q12H   Continuous Infusions: PRN Meds:.acetaminophen, levalbuterol, ondansetron (ZOFRAN) IV, oxyCODONE, sodium chloride flush      Subjective:   Sofian Kallstrom was seen and examined today.  Complaining of right hip pain.  States pain is not being controlled.  No fevers or chills.  No acute nausea vomiting or diarrhea.  No acute issues overnight  Objective:   Vitals:   11/17/20 1940 11/17/20 2300 11/18/20 0400 11/18/20 0700  BP: 123/78 125/80 114/71   Pulse: (!) 109 (!) 107 (!) 109   Resp: 14 17 17    Temp: 98.2 F (36.8 C) 98.1 F (36.7 C) 98.3 F (36.8 C) 98.4 F (36.9 C)  TempSrc: Oral Oral Oral Oral  SpO2: 98% 98% 98%   Weight:      Height:        Intake/Output Summary (Last 24 hours) at 11/18/2020 1322 Last data filed at 11/18/2020 0820 Gross per 24 hour  Intake 360 ml  Output 1551 ml  Net -1191 ml     Wt Readings from Last 3 Encounters:  10/27/20 110.3 kg  05/31/20 114.2 kg  03/25/20 112.8 kg     Exam  General: Alert and oriented x 3, NAD  Cardiovascular: S1 S2 auscultated, no murmurs, RRR  Respiratory: Decreased breath sound at the bases  Gastrointestinal: Soft, nontender, nondistended, + bowel sounds  Ext: left AKA  Neuro: no new deficits  Musculoskeletal: No digital cyanosis, clubbing  Skin: No rashes  Psych: Normal affect and demeanor, alert and oriented x3    Data Reviewed:  I have personally reviewed following labs  and imaging studies  Micro Results Recent Results (from the past 240 hour(s))  SARS Coronavirus 2 by RT PCR (hospital order, performed in [facility] hospital lab) Nasopharyngeal Nasopharyngeal Swab     Status: None   Collection Time: 11/10/20  1:04 PM   Specimen: Nasopharyngeal Swab  Result Value Ref Range Status   SARS Coronavirus 2 NEGATIVE NEGATIVE Final    Comment: (NOTE) SARS-CoV-2 target nucleic acids are NOT DETECTED.  The SARS-CoV-2 RNA is generally  detectable in upper and lower respiratory specimens during the acute phase of infection. The lowest concentration of SARS-CoV-2 viral copies this assay can detect is 250 copies / mL. A negative result does not preclude SARS-CoV-2 infection and should not be used as the sole basis for treatment or other patient management decisions.  A negative result may occur with improper specimen collection / handling, submission of specimen other than nasopharyngeal swab, presence of viral mutation(s) within the areas targeted by this assay, and inadequate number of viral copies (<250 copies / mL). A negative result must be combined with clinical observations, patient history, and epidemiological information.  Fact Sheet for Patients:   StrictlyIdeas.no  Fact Sheet for Healthcare Providers: BankingDealers.co.za  This test is not yet approved or  cleared by the [place] FDA and has been authorized for detection and/or diagnosis of SARS-CoV-2 by FDA under an Emergency Use Authorization (EUA).  This EUA will remain in effect (meaning this test can be used) for the duration of the COVID-19 declaration under Section 564(b)(1) of the Act, 21 U.S.C. section 360bbb-3(b)(1), unless the authorization is terminated or revoked sooner.  Performed at [facility], [place] [address], [place], [place] [address]   Culture, blood (routine x 2)     Status: None   Collection Time: 11/12/20  8:31 AM   Specimen: BLOOD RIGHT HAND  Result Value Ref Range Status   Specimen Description BLOOD RIGHT HAND  Final   Special Requests AEROBIC BOTTLE ONLY Blood Culture adequate volume  Final   Culture   Final    NO GROWTH 5 DAYS Performed at [facility], [place] [address] [place], [place] [address]    Report Status 11/17/2020 FINAL  Final  Culture, blood (routine x 2)     Status: None   Collection Time: 11/12/20  8:31 AM   Specimen: BLOOD RIGHT HAND  Result  Value Ref Range Status   Specimen Description BLOOD RIGHT HAND  Final   Special Requests AEROBIC BOTTLE ONLY Blood Culture adequate volume  Final   Culture   Final    NO GROWTH 5 DAYS Performed at [facility], [place] [address], [place], [place] [address]    Report Status 11/17/2020 FINAL  Final    Radiology Reports DG Chest 1 View  Result Date: 10/29/2020 CLINICAL DATA:  70 year old male with sepsis. Lower extremity gangrene. Shortness breath. Negative for COVID-19/influenza 10/25/2020. EXAM: CHEST  1 VIEW COMPARISON:  CTA abdomen 10/28/2020. Portable chest 10/26/2020 and earlier. FINDINGS: Portable AP semi upright view at 0905 hours. New right PICC line placed, tip at the cavoatrial junction. Compared to 2019 radiographs coarse bilateral pulmonary interstitial opacity is widespread, not significantly changed from 10/26/2020. And lung bases had an inflammatory appearance on the CTA yesterday. Small to moderate superimposed layering pleural effusions demonstrated on the CTA are not apparent. No pneumothorax. No consolidation. Stable cardiac size and mediastinal contours. Prior CABG. Visualized tracheal air column is within normal limits. No acute osseous abnormality identified. IMPRESSION:  1. New right PICC line, tip at the cavoatrial junction. 2. Widespread coarse pulmonary interstitial opacity is stable since 10/26/2020, and appeared inflammatory at the lung bases on the CTA yesterday. Favor acute viral/atypical respiratory infection. 3. Small to moderate layering pleural effusions on CTA yesterday are occult by x-ray. Electronically Signed   By: Genevie Ann M.D.   On: 10/29/2020 09:13   DG Chest 1 View  Result Date: 10/26/2020 CLINICAL DATA:  Hypertension EXAM: CHEST  1 VIEW COMPARISON:  September 16, 2018 FINDINGS: There are chronic bronchitic changes at the lung bases bilaterally. There is cardiomegaly. The patient is status post prior median sternotomy. There are prominent interstitial lung  markings at the lung bases suggestive of interstitial edema. There is no pneumothorax or large pleural effusion. No acute osseous abnormality. IMPRESSION: 1. Cardiomegaly with mild interstitial edema. 2. Chronic bronchitic changes at the lung bases bilaterally. Electronically Signed   By: Constance Holster M.D.   On: 10/26/2020 04:49   CT ANGIO AO+BIFEM W & OR WO CONTRAST  Result Date: 10/28/2020 CLINICAL DATA:  Sepsis, gangrene of the left foot and evidence of peripheral vascular disease. EXAM: CT ANGIOGRAPHY OF ABDOMINAL AORTA WITH ILIOFEMORAL RUNOFF TECHNIQUE: Multidetector CT imaging of the abdomen, pelvis and lower extremities was performed using the standard protocol during bolus administration of intravenous contrast. Multiplanar CT image reconstructions and MIPs were obtained to evaluate the vascular anatomy. CONTRAST:  190mL OMNIPAQUE IOHEXOL 350 MG/ML SOLN COMPARISON:  None. FINDINGS: VASCULAR Aorta: Diffuse atherosclerosis without evidence of aneurysm, occlusion or significant aortic stenosis. No evidence of dissection. Celiac: Calcified plaque at the origin of the celiac axis without significant stenosis. Distal branches are patent. SMA: Heavily calcified plaque at the origin of the SMA likely causing significant stenosis 70-80% caliber. Renals: Three separate right renal arteries and single left renal artery demonstrate atherosclerosis without high-grade stenoses. IMA: Patent. RIGHT Lower Extremity Inflow: Heavily calcified plaque throughout the iliac arteries. Common and external iliac arteries demonstrate no significant stenosis or aneurysmal disease. Outflow: Calcified plaque in the common femoral artery without significant stenosis. Profunda femoral artery is patent. Diffuse disease throughout the SFA with maximal stenosis approaching 50-70%. There is an indwelling stent present at the level of the mid to distal SFA which appears patent. Popliteal artery demonstrates diffuse plaque without  high-grade stenosis. Runoff: Diffuse disease throughout tibial arteries. The posterior tibial artery appears to be likely continuously patent into the foot. The anterior tibial artery is likely subtotally occluded in several segments but may be essentially patent into the foot. The peroneal artery is also similarly significantly diseased but may be continuously patent. LEFT Lower Extremity Inflow: Iliac arteries demonstrate diffuse calcified plaque. Common and external iliac arteries demonstrate no significant focal stenosis or evidence of aneurysmal disease. Outflow: Common femoral artery shows diffuse disease without significant stenosis. The profunda femoral artery is open and calcified. The left superficial femoral artery demonstrates initial patency and then becomes occluded in the proximal to mid thigh. There is reconstitution at the SFA/popliteal junction. The popliteal artery is heavily diseased and diffusely stenotic. Runoff: There is severe disease of the tibioperoneal trunk which is severely calcified. All 3 runoff vessels below the knee demonstrate significant calcification with posterior tibial and peroneal arteries demonstrating segments of occlusion beginning in the proximal calf. The anterior tibial artery is diseased but appears to be likely continuously patent into the foot. There is very faint reconstitution of plantar branches in the distal posterior tibial territory. Review of the MIP images confirms the  above findings. NON-VASCULAR Lower chest: Small bilateral pleural effusions. Lungs demonstrate significant areas of patchy airspace disease bilaterally consistent with pneumonia. Hepatobiliary: Liver surface is slightly nodular and early cirrhosis suspected. The gallbladder appears unremarkable. Pancreas: Unremarkable. No pancreatic ductal dilatation or surrounding inflammatory changes. Spleen: Normal in size without focal abnormality. Adrenals/Urinary Tract: Bilateral renal cysts appear benign.  The bladder is unremarkable. Stomach/Bowel: Bowel shows no evidence of obstruction, inflammation or lesion. No free air identified. Lymphatic: No enlarged lymph nodes identified in the abdomen or pelvis. Reproductive: Prostate is unremarkable. Other: No abdominal wall hernia or abnormality. No abdominopelvic ascites. Musculoskeletal: No acute or significant osseous findings. IMPRESSION: 1. Significant bilateral patchy airspace disease in both lungs consistent with pneumonia. Small bilateral pleural effusions. 2. 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 3. Diffuse disease throughout the right SFA with maximal stenosis approaching 50-70%. Patent stent at the level of the mid to distal SFA. Continuously patent posterior tibial runoff on the right and diseased anterior tibial and peroneal runoff. 4. Occlusion of the left superficial femoral artery in the proximal to mid thigh with reconstitution at the SFA/popliteal junction. 5. Severe disease of the left tibioperoneal trunk and segments of occlusions of the left posterior tibial and peroneal arteries. The left anterior tibial artery is diseased but appears to be continuously patent into the foot. There is very faint reconstitution of plantar branches in the distal posterior tibial territory. 6. Probable early cirrhosis. 7. Aortic atherosclerosis. Aortic Atherosclerosis (ICD10-I70.0). Electronically Signed   By: Aletta Edouard M.D.   On: 10/28/2020 16:05   DG Chest Port 1 View  Result Date: 11/12/2020 CLINICAL DATA:  Fever.  Sepsis. EXAM: PORTABLE CHEST 1 VIEW COMPARISON:  10/29/2020.  10/26/2020. FINDINGS: PICC line noted stable position. Prior CABG. Cardiomegaly. Bilateral interstitial infiltrates/edema again noted with interim improvement from prior exam. Tiny bilateral pleural effusions cannot be excluded. Right costophrenic angle incompletely imaged. No pneumothorax. Deformity again noted of the proximal left humerus. Carotid and peripheral vascular calcification noted.  IMPRESSION: 1. PICC line noted in stable position. 2. Prior CABG. Cardiomegaly. Bilateral interstitial infiltrates/edema again noted with interim improvement from prior exam. Tiny bilateral pleural effusions cannot be excluded. 3. Carotid and peripheral vascular disease. Electronically Signed   By: Marcello Moores  Register   On: 11/12/2020 08:28   DG ABD ACUTE 2+V W 1V CHEST  Result Date: 10/27/2020 CLINICAL DATA:  Emesis. EXAM: DG ABDOMEN ACUTE WITH 1 VIEW CHEST COMPARISON:  02/09/2013 and chest x-ray 10/26/2020 FINDINGS: Patient combative as best images obtained. Sternotomy wires unchanged. Right-sided PICC line has tip over the SVC. Lungs are adequately inflated demonstrate patchy bilateral airspace opacification which may be due to multifocal infection. No definite effusion. Mild stable cardiomegaly. Abdominopelvic images demonstrate air throughout the colon. There multiple air-filled nondilated small bowel loops present. No free peritoneal air. Degenerative changes of the spine and hips. There are surgical clips over the pelvis. IMPRESSION: 1. Nonspecific, nonobstructive bowel gas pattern. 2. Patchy bilateral airspace process which may be due to multifocal infection. Electronically Signed   By: Marin Olp M.D.   On: 10/27/2020 16:00   DG Foot Complete Left  Result Date: 10/25/2020 CLINICAL DATA:  Ulcer to the great toe and heel EXAM: LEFT FOOT - COMPLETE 3+ VIEW COMPARISON:  05/21/2015 FINDINGS: No acute displaced fracture or malalignment is seen. Large ulcer on the medial side of the first digit. Cortical erosive change on the medial side of first distal phalanx and at the head of the first proximal phalanx suspicious for osteomyelitis. Chronic  fracture deformity first proximal phalanx. Sclerosis and mild deformity at the necks of the fourth and fifth metatarsals, suspicious for subacute to developing chronic fractures. No erosive change at the calcaneus. No soft tissue emphysema. Vascular calcifications.  IMPRESSION: 1. Cortical erosive change on the medial side of the first distal phalanx and head of the first proximal phalanx suspicious for osteomyelitis. Large overlying ulcer medial side first digit 2. Probable subacute to developing chronic fractures involving the necks of the fourth and fifth metatarsals. Electronically Signed   By: Donavan Foil M.D.   On: 10/25/2020 19:11   ECHOCARDIOGRAM COMPLETE  Result Date: 10/29/2020    ECHOCARDIOGRAM REPORT   Patient Name:   [name] Date of Exam: 10/29/2020 Medical Rec #:  [id number]      Height:       67.0 in Accession #:    [id number]     Weight:       243.2 lb Date of Birth:  1951/02/18      BSA:          2.197 m Patient Age:    85 years       BP:           119/97 mmHg Patient Gender: M              HR:           103 bpm. Exam Location:  Inpatient Procedure: 2D Echo, Cardiac Doppler, Color Doppler and Intracardiac            Opacification Agent Indications:    Dyspnea  History:        Patient has prior history of Echocardiogram examinations, most                 recent 09/16/2018. CAD, Arrythmias:Atrial Flutter; Risk                 Factors:Current Smoker, Hypertension, Dyslipidemia, Sleep Apnea                 and Diabetes. H/O stroke. PAD. Ischemic cardiomyopathy.  Sonographer:    Clayton Lefort RDCS (AE) Referring Phys: [id number] [facility]  Sonographer Comments: Technically difficult study due to poor echo windows, suboptimal parasternal window, suboptimal apical window and no subcostal window. Image acquisition challenging due to patient body habitus. IMPRESSIONS  1. Left ventricular ejection fraction, by estimation, is 25 to 30%. The left ventricle has severely decreased function. The left ventricle demonstrates global hypokinesis. The left ventricular internal cavity size was mildly dilated. There is mild left ventricular hypertrophy. Left ventricular diastolic parameters are indeterminate.  2. Right ventricular systolic function is normal. The right  ventricular size is normal.  3. Left atrial size was mildly dilated.  4. The mitral valve is normal in structure. Mild mitral valve regurgitation. No evidence of mitral stenosis.  5. The aortic valve is tricuspid. Aortic valve regurgitation is not visualized. Mild aortic valve sclerosis is present, with no evidence of aortic valve stenosis.  6. The inferior vena cava is normal in size with greater than 50% respiratory variability, suggesting right atrial pressure of 3 mmHg. FINDINGS  Left Ventricle: Left ventricular ejection fraction, by estimation, is 25 to 30%. The left ventricle has severely decreased function. The left ventricle demonstrates global hypokinesis. Definity contrast agent was given IV to delineate the left ventricular endocardial borders. The left ventricular internal cavity size was mildly dilated. There is mild left ventricular hypertrophy. Left ventricular diastolic function could not be evaluated due to  atrial fibrillation. Left ventricular diastolic parameters are indeterminate. Right Ventricle: The right ventricular size is normal.Right ventricular systolic function is normal. Left Atrium: Left atrial size was mildly dilated. Right Atrium: Right atrial size was normal in size. Pericardium: There is no evidence of pericardial effusion. Mitral Valve: The mitral valve is normal in structure. Mild mitral annular calcification. Mild mitral valve regurgitation. No evidence of mitral valve stenosis. Tricuspid Valve: The tricuspid valve is normal in structure. Tricuspid valve regurgitation is not demonstrated. No evidence of tricuspid stenosis. Aortic Valve: The aortic valve is tricuspid. Aortic valve regurgitation is not visualized. Mild aortic valve sclerosis is present, with no evidence of aortic valve stenosis. Aortic valve mean gradient measures 2.0 mmHg. Aortic valve peak gradient measures 3.4 mmHg. Aortic valve area, by VTI measures 2.07 cm. Pulmonic Valve: The pulmonic valve was not well  visualized. Pulmonic valve regurgitation is not visualized. No evidence of pulmonic stenosis. Aorta: The aortic root is normal in size and structure. Venous: The inferior vena cava is normal in size with greater than 50% respiratory variability, suggesting right atrial pressure of 3 mmHg.  LEFT VENTRICLE PLAX 2D LVIDd:         5.50 cm LVIDs:         4.80 cm LV PW:         1.10 cm LV IVS:        1.20 cm LVOT diam:     2.20 cm LV SV:         29 LV SV Index:   13 LVOT Area:     3.80 cm  IVC IVC diam: 1.90 cm LEFT ATRIUM             Index LA diam:        4.80 cm 2.18 cm/m LA Vol (A2C):   66.2 ml 30.13 ml/m LA Vol (A4C):   79.7 ml 36.27 ml/m LA Biplane Vol: 74.3 ml 33.81 ml/m  AORTIC VALVE AV Area (Vmax):    2.10 cm AV Area (Vmean):   2.00 cm AV Area (VTI):     2.07 cm AV Vmax:           91.92 cm/s AV Vmean:          67.220 cm/s AV VTI:            0.141 m AV Peak Grad:      3.4 mmHg AV Mean Grad:      2.0 mmHg LVOT Vmax:         50.88 cm/s LVOT Vmean:        35.380 cm/s LVOT VTI:          0.077 m LVOT/AV VTI ratio: 0.55  AORTA Ao Root diam: 3.90 cm Ao Asc diam:  3.80 cm  SHUNTS Systemic VTI:  0.08 m Systemic Diam: 2.20 cm Kirk Ruths MD Electronically signed by Kirk Ruths MD Signature Date/Time: 10/29/2020/2:15:31 PM    Final    VAS [place] UPPER EXTREMITY VENOUS DUPLEX  Result Date: 11/13/2020 UPPER VENOUS STUDY  Indications: Swelling RT arm, PICC line Limitations: Bandaging around PICC insertion. Comparison Study: No prior studies. Performing Technologist: Darlin Coco RDMS  Examination Guidelines: A complete evaluation includes B-mode imaging, spectral Doppler, color Doppler, and power Doppler as needed of all accessible portions of each vessel. Bilateral testing is considered an integral part of a complete examination. Limited examinations for reoccurring indications may be performed as noted.  Right Findings: +----------+------------+---------+-----------+----------+---------------------+ RIGHT      CompressiblePhasicitySpontaneousProperties  Summary        +----------+------------+---------+-----------+----------+---------------------+ IJV           Full       Yes       Yes                                    +----------+------------+---------+-----------+----------+---------------------+ Subclavian    Full       Yes       Yes                                    +----------+------------+---------+-----------+----------+---------------------+ Axillary      Full       Yes       Yes                                    +----------+------------+---------+-----------+----------+---------------------+ Brachial      Full       Yes       Yes                Some segments not                                                        well visualized due                                                          to bandaging-                                                          visualized segments                                                             patent         +----------+------------+---------+-----------+----------+---------------------+ Radial        Full                                                        +----------+------------+---------+-----------+----------+---------------------+ Ulnar         Full                                                        +----------+------------+---------+-----------+----------+---------------------+ Cephalic  Partial      Yes       Yes                Age Indeterminate   +----------+------------+---------+-----------+----------+---------------------+ Basilic                                              Not well visualized                                                        due to bandaging    +----------+------------+---------+-----------+----------+---------------------+  Left Findings: +----------+------------+---------+-----------+----------+-------+ LEFT       CompressiblePhasicitySpontaneousPropertiesSummary +----------+------------+---------+-----------+----------+-------+ Subclavian    Full       Yes       Yes                      +----------+------------+---------+-----------+----------+-------+  Summary:  Right: No evidence of deep vein thrombosis in the upper extremity. Findings consistent with age indeterminate superficial vein thrombosis involving the right cephalic vein. However, unable to visualize the basilic vein along with some segments of the brachial vein at the PICC insertion.  Left: No evidence of thrombosis in the subclavian.  *See table(s) above for measurements and observations.  Diagnosing physician: Harold Barban MD Electronically signed by Harold Barban MD on 11/13/2020 at 7:12:19 PM.    Final    [place] EKG SITE RITE  Result Date: 10/27/2020 If Site Rite image not attached, placement could not be confirmed due to current cardiac rhythm.   Lab Data:  CBC: Recent Labs  Lab 11/13/20 0553 11/14/20 0439 11/15/20 0534 11/16/20 0250 11/18/20 0725  WBC 11.0* 8.9 11.5* 9.7 10.6*  HGB 8.7* 9.9* 8.9* 10.0* 10.7*  HCT 28.2* 30.4* 27.4* 30.5* 34.4*  MCV 99.3 97.1 97.2 96.8 97.5  PLT 183 203 181 222 [id number]   Basic Metabolic Panel: Recent Labs  Lab 11/12/20 0400 11/13/20 0553 11/14/20 0439 11/16/20 0250  NA 136 135 137 137  K 4.2 3.7 3.8 4.0  CL 103 104 103 104  CO2 25 25 24 24   GLUCOSE 223* 146* 131* 161*  BUN 8 11 11 9   CREATININE 0.90 0.79 0.78 0.69  CALCIUM 8.0* 7.7* 7.9* 8.0*   GFR: Estimated Creatinine Clearance: 104.8 mL/min (by C-G formula based on SCr of 0.69 mg/dL). Liver Function Tests: No results for input(s): AST, ALT, ALKPHOS, BILITOT, PROT, ALBUMIN in the last 168 hours. No results for input(s): LIPASE, AMYLASE in the last 168 hours. No results for input(s): AMMONIA in the last 168 hours. Coagulation Profile: No results for input(s): INR, PROTIME in the last 168 hours. Cardiac Enzymes: Recent Labs   Lab 11/14/20 0439  CKTOTAL 134   BNP (last 3 results) No results for input(s): PROBNP in the last 8760 hours. HbA1C: No results for input(s): HGBA1C in the last 72 hours. CBG: Recent Labs  Lab 11/17/20 1307 11/17/20 1600 11/17/20 2233 11/18/20 0714 11/18/20 1150  GLUCAP 189* 215* 179* 125* 196*   Lipid Profile: No results for input(s): CHOL, HDL, LDLCALC, TRIG, CHOLHDL, LDLDIRECT in the last 72 hours. Thyroid Function Tests: No results for input(s): TSH, T4TOTAL, FREET4, T3FREE, THYROIDAB in the last 72 hours. Anemia Panel: No results  for input(s): VITAMINB12, FOLATE, FERRITIN, TIBC, IRON, RETICCTPCT in the last 72 hours. Urine analysis:    Component Value Date/Time   COLORURINE AMBER (A) 11/12/2020 1717   APPEARANCEUR CLEAR 11/12/2020 1717   LABSPEC >1.030 (H) 11/12/2020 1717   PHURINE 6.0 11/12/2020 1717   GLUCOSEU NEGATIVE 11/12/2020 1717   HGBUR TRACE (A) 11/12/2020 1717   BILIRUBINUR NEGATIVE 11/12/2020 1717   KETONESUR NEGATIVE 11/12/2020 1717   PROTEINUR 30 (A) 11/12/2020 1717   UROBILINOGEN 0.2 08/20/2014 2042   NITRITE POSITIVE (A) 11/12/2020 1717   LEUKOCYTESUR NEGATIVE 11/12/2020 1717     Lasasha Brophy M.D. [facility] 11/18/2020, 1:22 PM   Call night coverage person covering after 7pm

## 2020-11-18 NOTE — Progress Notes (Addendum)
Progress Note  Patient Name: [name] Date of Encounter: 11/18/2020  [facility] Cardiologist: Kirk Ruths, MD   Subjective  Feeling okay. No chest pain, sob or palpitations.   Inpatient Medications    Scheduled Meds: . amiodarone  200 mg Oral Daily  . apixaban  5 mg Oral BID  . atorvastatin  40 mg Oral Daily  . baclofen  5 mg Oral TID  . Chlorhexidine Gluconate Cloth  6 each Topical Daily  . cholecalciferol  5,000 Units Oral Q1200  . donepezil  5 mg Oral QHS  . feeding supplement (GLUCERNA SHAKE)  237 mL Oral TID BM  . ferrous sulfate  325 mg Oral BID WC  . finasteride  5 mg Oral Q1200  . furosemide  40 mg Oral Daily  . gabapentin  300 mg Oral TID  . Gerhardt's butt cream   Topical QID  . insulin aspart  0-15 Units Subcutaneous TID WC  . insulin aspart  0-5 Units Subcutaneous QHS  . insulin glargine  10 Units Subcutaneous QHS  . loratadine  5 mg Oral QHS  . losartan  25 mg Oral Daily  . memantine  5 mg Oral BID  . metoprolol succinate  50 mg Oral Daily  . pantoprazole  40 mg Oral Daily  . potassium chloride  20 mEq Oral Daily  . pregabalin  25 mg Oral Daily  . sodium chloride flush  10-40 mL Intracatheter Q12H   Continuous Infusions:  PRN Meds: acetaminophen, levalbuterol, ondansetron (ZOFRAN) IV, oxyCODONE, sodium chloride flush   Vital Signs    Vitals:   11/17/20 1940 11/17/20 2300 11/18/20 0400 11/18/20 0700  BP: 123/78 125/80 114/71   Pulse: (!) 109 (!) 107 (!) 109   Resp: 14 17 17    Temp: 98.2 F (36.8 C) 98.1 F (36.7 C) 98.3 F (36.8 C) 98.4 F (36.9 C)  TempSrc: Oral Oral Oral Oral  SpO2: 98% 98% 98%   Weight:      Height:        Intake/Output Summary (Last 24 hours) at 11/18/2020 1007 Last data filed at 11/18/2020 0820 Gross per 24 hour  Intake 710 ml  Output 1751 ml  Net -1041 ml   Last 3 Weights 10/27/2020 10/26/2020 10/25/2020  Weight (lbs) 243 lb 2.7 oz 243 lb 9.7 oz 256 lb  Weight (kg) 110.3 kg 110.5 kg 116.121 kg       Telemetry    Atrial flutter at 100s - Personally Reviewed  ECG    N/A  Physical Exam   GEN: ill appearing male in no acute distress.   Neck: No JVD Cardiac: regular tachycardic, no murmurs, rubs, or gallops.  Respiratory: Clear to auscultation bilaterally. GI: Soft, nontender, non-distended  MS: No edema; L AKA Neuro:  Nonfocal  Psych: Normal affect   Labs    High Sensitivity Troponin:   Recent Labs  Lab 11/05/20 2219 11/06/20 0013  TROPONINIHS 43* 42*      Chemistry Recent Labs  Lab 11/13/20 0553 11/14/20 0439 11/16/20 0250  NA 135 137 137  K 3.7 3.8 4.0  CL 104 103 104  CO2 25 24 24   GLUCOSE 146* 131* 161*  BUN 11 11 9   CREATININE 0.79 0.78 0.69  CALCIUM 7.7* 7.9* 8.0*  GFRNONAA >60 >60 >60  ANIONGAP 6 10 9      Hematology Recent Labs  Lab 11/15/20 0534 11/16/20 0250 11/18/20 0725  WBC 11.5* 9.7 10.6*  RBC 2.82* 3.15* 3.53*  HGB 8.9* 10.0* 10.7*  HCT 27.4* 30.5* 34.4*  MCV 97.2 96.8 97.5  MCH 31.6 31.7 30.3  MCHC 32.5 32.8 31.1  RDW 18.2* 18.1* 17.9*  PLT 181 222 269    Radiology    No results found.  Cardiac Studies   Echo 10/29/2020 1. Left ventricular ejection fraction, by estimation, is 25 to 30%. The  left ventricle has severely decreased function. The left ventricle  demonstrates global hypokinesis. The left ventricular internal cavity size  was mildly dilated. There is mild left  ventricular hypertrophy. Left ventricular diastolic parameters are  indeterminate.  2. Right ventricular systolic function is normal. The right ventricular  size is normal.  3. Left atrial size was mildly dilated.  4. The mitral valve is normal in structure. Mild mitral valve  regurgitation. No evidence of mitral stenosis.  5. The aortic valve is tricuspid. Aortic valve regurgitation is not  visualized. Mild aortic valve sclerosis is present, with no evidence of  aortic valve stenosis.  6. The inferior vena cava is normal in size with  greater than 50%  respiratory variability, suggesting right atrial pressure of 3 mmHg.   Patient Profile     70 y.o. male with a PMH of CAD s/p CABG in 2014, chronic combined CHF, paroxysmal atrial flutter, carotid artery disease with known right sided occlusion and moderate left stenosis, PVD, HTN, HLD, DM type 2, and CVA with left sided hemiparesis,whowas initially seen for CHF and atrial flutter. Echocardiogram this admission shows ejection fraction 25 to 30%, mild left ventricular enlargement, mild left ventricular hypertrophy, mild left atrial enlargement, mild mitral regurgitation. EF worse compared to previous. He is s/p L AKA  Assessment & Plan    1. Acute on chronic combined CHF/ ICM - Echo this admission showed newly reduced LVEF to 25-30% from 50% 09/2018. - Plan for medical management - Continue Toprol XL 50mg  qd  and Losartan 25mg  qd - Continue current dose of lasix at 40mg  qd  - He appears euvolemic  2. Persistent atrial flutter - Rate stable in 100s - Continue Amiodarone 200mg  qd and Toprol XL 50mg  qd - Continue Eliquis 5mg  qd  3. 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   Medication Recommendations:  Continue present cardiac medications  Other recommendations (labs, testing, etc):  none Follow up as an outpatient:  Follow up with Dr. Stanford Breed after discharge in 4 weeks (can call card master at discharge to arrange follow up).   For questions or updates, please contact [place] Please consult [email or web address] for contact info under        Signed, Leanor Kail, PA  11/18/2020, 10:07 AM    Personally seen and examined. Agree with above.   Doing well, laying comfortably in bed.  No chest pain, no shortness of breath.  Echocardiogram EF 25 to 30%  70 y.o. male with a PMH of CAD s/p CABG in 2014, chronic combined CHF, paroxysmal atrial flutter, carotid artery disease with known right  sided occlusion and moderate left stenosis, PVD, HTN, HLD, DM type 2, and CVA with left sided hemiparesis,whowas initially seen for CHF and atrial flutter. Echocardiogram this admission shows ejection fraction 25 to 30%, mild left ventricular enlargement, mild left ventricular hypertrophy, mild left atrial enlargement, mild mitral regurgitation. EF worse compared to previous.He is s/p L AKA.  Acute on chronic systolic heart failure -EF 25-30 -Continue with medical management which includes Toprol and losartan.  Continue  Lasix 40 mg a day. -Currently appears euvolemic.  No changes made.  Persistent atrial flutter -Rates have been stable in the 100s.  Adequately controlled. -Continue with amiodarone 200 mg a day as well as Toprol 50 a day.  Chronic anticoagulation -Continue with Eliquis.  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  Please let us know if we can be of further assistance, we will go ahead and sign off.  Follow up with Dr. Stanford Breed after discharge in 4 weeks (can call card master at discharge to arrange follow up).   Candee Furbish, MD

## 2020-11-18 NOTE — TOC Progression Note (Signed)
Transition of Care [facility]) - Progression Note    Patient Details  Name: [name] MRN: [id number] Date of Birth: Aug 30, 1951  Transition of Care [facility]) CM/SW [place], [place] Phone Number: 11/18/2020, 12:47 PM  Clinical Narrative:     [employer] insurance authorization, reference # [id number] [place] still on [place] admission to confirmed they can admit tomorrow - called left voice message  covid test requested   Thurmond Butts, MSW, LCSW Clinical Social Worker   Expected Discharge Plan: Skilled Nursing Facility Barriers to Discharge: Continued Medical Work up  Expected Discharge Plan and Services Expected Discharge Plan: [place] In-house Referral: Clinical Social Work   Post Acute Care Choice: Hospice Living arrangements for the past 2 months: Single Family Home                                       Social Determinants of Health (SDOH) Interventions    Readmission Risk Interventions No flowsheet data found.

## 2020-11-18 NOTE — Discharge Instructions (Signed)

## 2020-11-18 NOTE — TOC Progression Note (Addendum)
Transition of Care [facility]) - Progression Note    Patient Details  Name: [name] MRN: [id number] Date of Birth: 1951/02/29  Transition of Care [facility]) CM/SW [place], [place] Phone Number: 11/18/2020, 11:05 AM  Clinical Narrative:      11:07am- called SNf back to inform anticipated d/c tomorrow - waiting on call back 10:30-Rn updated covid test requested  2:11DB [facility]) received confirmed bed offer-    Expected Discharge Plan: [place] Barriers to Discharge: Continued Medical Work up  Expected Discharge Plan and Services Expected Discharge Plan: [place] In-house Referral: Clinical Social Work   Post Acute Care Choice: Hospice Living arrangements for the past 2 months: Single Family Home                                       Social Determinants of Health (SDOH) Interventions    Readmission Risk Interventions No flowsheet data found.

## 2020-11-18 NOTE — Evaluation (Signed)
Occupational Therapy Evaluation Patient Details Name: [name] MRN: [id number] DOB: 12/09/1950 Today's Date: 11/18/2020    History of Present Illness 70 y.o. male with a PMH of CAD s/p CABG in 2014,  CHF, paroxysmal atrial flutter,  PVD, HTN, HLD, DM type 2, and CVA with left sided hemiparesis, who was initially seen for hypoxic respiratory failure from CHF. Pt also with L foot osteomyelitis and developed sepsis. He is s/p L AKA.   Clinical Impression   Prior to admission Pt using Rollator in the house and electric scooter in community - wife does all IADL and assists with LB ADL. Today Pt is max A +2 for all aspects of bed mobility and total A +2 for attempts at lateral transfers. Residual weakness from prior CVA on L side impact ability to assist. Pt is overall mod A for UB ADL and total A for LB ADL at this time. Recommend SNF post-acute to maximize safety and independence in ADL and functional transfers. OT will follow acutely.    Follow Up Recommendations  SNF    Equipment Recommendations  Other (comment) (defer to next venue of care)    Recommendations for Other Services       Precautions / Restrictions Precautions Precautions: Fall Restrictions Weight Bearing Restrictions: Yes LLE Weight Bearing: Non weight bearing Other Position/Activity Restrictions: new AKA      Mobility Bed Mobility Overal bed mobility: Needs Assistance Bed Mobility: Supine to Sit;Sit to Supine     Supine to sit: Max assist;+2 for physical assistance Sit to supine: Max assist;+2 for physical assistance   General bed mobility comments: Required assist for trunk and LE assist. Also required assist to assist with LUE management and scoot hips to EOB. Initially required mod A for sittting balance, but able to progress to min guard A.    Transfers Overall transfer level: Needs assistance Equipment used: 2 person hand held assist Transfers: Lateral/Scoot Transfers          Lateral/Scoot  Transfers: Max assist;+2 physical assistance General transfer comment: Performed lateral scoot along EOB with max A +2. Cues to push through RLE to assist with scooting.    Balance Overall balance assessment: Needs assistance Sitting-balance support: Single extremity supported;Feet supported Sitting balance-Leahy Scale: Poor Sitting balance - Comments: Initially requiring mod A to maintain sitting balance, but progressed to min guard with use of RUE for support                                   ADL either performed or assessed with clinical judgement   ADL Overall ADL's : Needs assistance/impaired Eating/Feeding: Minimal assistance;Bed level   Grooming: Minimal assistance;Bed level   Upper Body Bathing: Maximal assistance   Lower Body Bathing: Total assistance   Upper Body Dressing : Maximal assistance   Lower Body Dressing: Total assistance   Toilet Transfer: Total assistance;+2 for safety/equipment (recommend lift equipment at this time)   Toileting- Clothing Manipulation and Hygiene: Total assistance;Bed level       Functional mobility during ADLs: Total assistance;+2 for safety/equipment       Vision         Perception     Praxis      Pertinent Vitals/Pain Pain Assessment: 0-10 Pain Score: 10-Worst pain ever Pain Location: Residual limb, Phantom pain Pain Descriptors / Indicators: Aching Pain Intervention(s): Monitored during session;Repositioned     Hand Dominance Left   Extremity/Trunk Assessment  Upper Extremity Assessment Upper Extremity Assessment: LUE deficits/detail LUE Deficits / Details: baseline weakness from prior CVA, overall 4/5 some mild inattention   Lower Extremity Assessment Lower Extremity Assessment: Defer to PT evaluation LLE Deficits / Details: s/p L AKA. Weakness at baseline at hip secondary to CVA. Unable to perform SLR in supine.   Cervical / Trunk Assessment Cervical / Trunk Assessment: Normal   Communication  Communication Communication: Other (comment) (mild slurring of speech)   Cognition Arousal/Alertness: Awake/alert Behavior During Therapy: WFL for tasks assessed/performed Overall Cognitive Status: No family/caregiver present to determine baseline cognitive functioning                                 General Comments: Unable to state the month or year. States he is at [employer]. Does have hx of CVA   General Comments  Pt very pleasant and cooperative throughout session    Exercises     Shoulder Instructions      Home Living Family/patient expects to be discharged to:: Private residence Living Arrangements: Spouse/significant other Available Help at Discharge: Family;Available PRN/intermittently Type of Home: House Home Access: Stairs to enter [employer] of Steps: 1 Entrance Stairs-Rails: None Home Layout: One level     Bathroom Shower/Tub: Occupational psychologist: Handicapped height     Home Equipment: Environmental consultant - 2 wheels;Wheelchair - Press photographer;Shower seat          Prior Functioning/Environment Level of Independence: Needs assistance  Gait / Transfers Assistance Needed: household ambulator using RW, uses Transport planner for longer distances, ADL's / Homemaking Assistance Needed: Wife assisted with bathing/dressing. Wife also performed IADLs.            OT Problem List: Decreased strength;Decreased range of motion;Decreased activity tolerance;Impaired balance (sitting and/or standing);Decreased cognition;Decreased safety awareness;Decreased knowledge of use of DME or AE;Decreased knowledge of precautions;Obesity;Pain      OT Treatment/Interventions: Self-care/ADL training;Therapeutic exercise;Energy conservation;DME and/or AE instruction;Therapeutic activities;Balance training;Patient/family education    OT Goals(Current goals can be found in the care plan section) Acute Rehab OT Goals Patient Stated Goal: to be  able to move better OT Goal Formulation: With patient Time For Goal Achievement: 12/02/20 Potential to Achieve Goals: Good ADL Goals Pt Will Perform Grooming: with set-up;sitting Pt Will Perform Upper Body Dressing: with min assist;sitting Pt Will Transfer to Toilet: with max assist;with transfer board;with +2 assist;bedside commode Pt Will Perform Toileting - Clothing Manipulation and hygiene: with max assist;sitting/lateral leans Additional ADL Goal #1: Pt will perform bed mobility at mod A prior to engaging in ADL  OT Frequency: Min 2X/week   Barriers to D/C:            Co-evaluation PT/OT/SLP Co-Evaluation/Treatment: Yes Reason for Co-Treatment: Complexity of the patient's impairments (multi-system involvement);Necessary to address cognition/behavior during functional activity;For patient/therapist safety;To address functional/ADL transfers PT goals addressed during session: Mobility/safety with mobility;Balance OT goals addressed during session: ADL's and self-care      AM-PAC OT "6 Clicks" Daily Activity     Outcome Measure Help from another person eating meals?: A Little Help from another person taking care of personal grooming?: A Lot Help from another person toileting, which includes using toliet, bedpan, or urinal?: Total Help from another person bathing (including washing, rinsing, drying)?: A Lot Help from another person to put on and taking off regular upper body clothing?: A Lot Help from another person to put on and taking off  regular lower body clothing?: Total 6 Click Score: 11   End of Session Nurse Communication: Mobility status  Activity Tolerance: Patient tolerated treatment well Patient left: in bed;with call bell/phone within reach;with bed alarm set  OT Visit Diagnosis: Unsteadiness on feet (R26.81);Other abnormalities of gait and mobility (R26.89);Muscle weakness (generalized) (M62.81);Pain Pain - Right/Left: Left Pain - part of body: Leg                 Time: 1425-1445 OT Time Calculation (min): 20 min Charges:  OT General Charges $OT Visit: 1 Visit OT Evaluation $OT Eval Moderate Complexity: [facility] OTR/L Acute Rehabilitation Services Pager: [phone] Office: [place] 11/18/2020, 3:23 PM

## 2020-11-19 ENCOUNTER — Inpatient Hospital Stay (HOSPITAL_COMMUNITY): Payer: Medicare Other

## 2020-11-19 DIAGNOSIS — M86172 Other acute osteomyelitis, left ankle and foot: Secondary | ICD-10-CM | POA: Diagnosis not present

## 2020-11-19 DIAGNOSIS — I483 Typical atrial flutter: Secondary | ICD-10-CM | POA: Diagnosis not present

## 2020-11-19 DIAGNOSIS — N400 Enlarged prostate without lower urinary tract symptoms: Secondary | ICD-10-CM | POA: Diagnosis not present

## 2020-11-19 LAB — GLUCOSE, CAPILLARY
Glucose-Capillary: 145 mg/dL — ABNORMAL HIGH (ref 70–99)
Glucose-Capillary: 240 mg/dL — ABNORMAL HIGH (ref 70–99)
Glucose-Capillary: 100 mg/dL — ABNORMAL HIGH (ref 70–99)
Glucose-Capillary: 136 mg/dL — ABNORMAL HIGH (ref 70–99)

## 2020-11-19 LAB — D-DIMER, QUANTITATIVE: D-Dimer, Quant: 0.89 ug/mL-FEU — ABNORMAL HIGH (ref 0.00–0.50)

## 2020-11-19 LAB — RESP PANEL BY RT-PCR (FLU A&B, COVID) ARPGX2
Influenza A by PCR: NEGATIVE
Influenza B by PCR: NEGATIVE
SARS Coronavirus 2 by RT PCR: POSITIVE — AB

## 2020-11-19 LAB — C-REACTIVE PROTEIN: CRP: 1.7 mg/dL — ABNORMAL HIGH (ref <1.0)

## 2020-11-19 NOTE — TOC Progression Note (Signed)
Transition of Care [facility]) - Progression Note    Patient Details  Name: [name] MRN: [id number] Date of Birth: [birth year]  Transition of Care [facility]) CM/SW [place], [place] Phone Number: 11/19/2020, 3:38 PM  Clinical Narrative:     CSW informed SNF patient has covid - will not be able to discharge today.  Aneth - requested patient off isolation precautions before discharge to SNF.  Thurmond Butts, MSW, LCSW Clinical Social Worker   Expected Discharge Plan: Skilled Nursing Facility Barriers to Discharge: Continued Medical Work up  Expected Discharge Plan and Services Expected Discharge Plan: [place] In-house Referral: Clinical Social Work   Post Acute Care Choice: Hospice Living arrangements for the past 2 months: Single Family Home                                       Social Determinants of Health (SDOH) Interventions    Readmission Risk Interventions No flowsheet data found.

## 2020-11-19 NOTE — Progress Notes (Addendum)
[facility]                                                                              Patient Demographics  [name], is a 70 y.o. male, DOB - 02-18-1951, ATF:[id number]  Admit date - 10/25/2020   Admitting Physician Bernadette Hoit, DO  Outpatient Primary MD for the patient is Leeanne Rio, MD  Outpatient specialists:   LOS - 25  days   Medical records reviewed and are as summarized below:    Chief Complaint  Patient presents with  . Foot Pain  . Foot Ulcer       Brief summary   Patient is a 70 y.o. male with hypertension HLD, T2DM, a flutter, CAD, history of a stroke with left hemiparesis admitted with left foot wound from recent trauma.Pt was [facility] surgery was consulted and transferred to [facility] 1/17-but patient refused amputation. Patient had complicated hospitalization with acute hypoxic respiratory failure, acute on chronic systolic CHF. Seen by cardiology and vascular. Palliative care was consulted since he initially refused amputation. After family meeting with palliative care on 11/04/20,patient and wife wanted to proceed with amputation-vascular surgery and cardiology were notified for preop ( due to new low EF).But familydecided against surgeryon 1/26. Eventually on 1/29family reverted to full code and proceededwith L AKA on 1/31. SW is working on placement.   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 Dr Lupita Leash discussed with ID, Dr. Linus Salmons on 2/2, ok to discontinue oral antibiotics after AKA on 1/31 -Sepsis physiology resolved, remains off antibiotics.  No fevers, monitor WBC count, 10.6 -Continue pain management, palliative medicine assisting -1 febrile episode overnight on 2/1, blood cultures negative, duplex negative for acute DVT, has  superficial thrombosis right cephalic vein  Covid 19 positive -Likely incidental, currently asymptomatic, no pulmonary symptoms, no hypoxia.  COVID-19 was checked for SNF placement and returned positive -Will obtain baseline chest x-ray, CRP, D-dimer.  If CRP elevated or infiltrates, will place on 3 days of remdesivir.  If developing hypoxia, will add steroids and 5 days of remdesevir . -Placed on airborne and contact precautions, will need isolation for 10 days inpatient -Patient is currently on apixaban Addendum: 4:40pm CXR with mild interstitial thickening, residual of COVID pneumonitis CRP 1.7 better than baseline 2.3 on 1/26 D Dimer 0.89, mildly elevated, patient is already on eliquis No symptoms, fevers, hypoxia, for now incidental Covid and will continue to monitor  Dr. Graylon Good requested rapid Covid PCR for CT value.    Acute metabolic encephalopathy -Multifactorial, postop, multiple medical issues, polypharmacy -Improving, only complaint is the pain. Palliative medicine assisting with the pain management -Good sleep hygiene, avoid increasing sedatives or narcotics  Acute blood loss anemia -8.7 on 2/2, currently stable, recheck CBC in a.m.  Chronic combined systolic, diastolic CHF -2D echo showed EF of 25 to 30%, new.  Cardiology was consulted. -Per cardiology, no plans for invasive work-up.   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 -Continue eliquis.  Plavix has been discontinued.  No further ischemic work-up -Continue Toprol, losartan, Lasix, eliquis  Acute respiratory failure with hypoxia -Postoperatively, currently resolved.   -No worsening hypoxia O2 sats 92%  Diabetes mellitus  type 2, IDDM -Hemoglobin A1c 6.9, CBGs fairly stable -Continue Lantus, SSI   History of CVA with left hemiparesis, PAD, hypertension -Plavix discontinued, currently on eliquis -No acute issues  Chronic kidney disease stage II -Currently at baseline   Dementia -Continue Aricept, Namenda, minimize sedatives, escalating pain regimen  GERD -Continue PPI  BPH -Continue finasteride  Obesity Estimated body mass index is 38.09 kg/m as calculated from the following:   Height as of this encounter: 5\' 7"  (1.702 m).   Weight as of this encounter: 110.3 kg.   Code Status: full code  DVT Prophylaxis: apixaban (ELIQUIS) tablet 5 mg   Level of Care: Level of care: Telemetry Surgical Family Communication: Discussed all imaging results, lab results, explained to the patient and updated pt's wife, Mrs Dewaine Morocho on phone  #on [phone] Disposition Plan:     Status is: Inpatient  Remains inpatient appropriate because:Inpatient level of care appropriate due to severity of illness   Dispo: The patient is from: Home              Anticipated d/c is to: SNF              Anticipated d/c date is: > 3 days              Patient currently is medically stable to d/c.   Difficult to place patient     Time Spent in minutes  63mins   Procedures:    Consultants:   Cardiology  Palliative  Vascular   Antimicrobials:   Anti-infectives (From admission, onward)   Start     Dose/Rate Route Frequency Ordered Stop   11/07/20 1000  cefTRIAXone (ROCEPHIN) 2 g in sodium chloride 0.9 % 100 mL IVPB  Status:  Discontinued        2 g 200 mL/hr over 30 Minutes Intravenous Every 24 hours 11/07/20 0742 11/13/20 1343   11/07/20 1000  DAPTOmycin (CUBICIN) 670 mg in sodium chloride 0.9 % IVPB  Status:  Discontinued        670 mg 226.8 mL/hr over 30 Minutes Intravenous Daily 11/07/20 0746 11/13/20 1343   11/03/20 2200  metroNIDAZOLE (FLAGYL) tablet 500 mg  Status:  Discontinued        500 mg Oral  Every 8 hours 11/03/20 1554 11/13/20 1343   11/03/20 2000  sulfamethoxazole-trimethoprim (BACTRIM DS) 800-160 MG per tablet 1 tablet  Status:  Discontinued        1 tablet Oral Every 12 hours 11/03/20 1554 11/07/20 0742   11/01/20 0915  cefTRIAXone (ROCEPHIN) 2 g in sodium chloride 0.9 % 100 mL IVPB  Status:  Discontinued        2 g 200 mL/hr over 30 Minutes Intravenous Every 24 hours 11/01/20 0816 11/03/20 1551   10/29/20 1400  vancomycin (VANCOREADY) IVPB 750 mg/150 mL  Status:  Discontinued        750 mg 150 mL/hr over 60 Minutes Intravenous Every 12 hours 10/29/20 1238 11/01/20 0816   10/28/20 1400  vancomycin (VANCOCIN) IVPB 1000 mg/200 mL premix  Status:  Discontinued        1,000  mg 200 mL/hr over 60 Minutes Intravenous Every 12 hours 10/28/20 1344 10/29/20 1238   10/28/20 0900  metroNIDAZOLE (FLAGYL) IVPB 500 mg  Status:  Discontinued        500 mg 100 mL/hr over 60 Minutes Intravenous Every 8 hours 10/28/20 0808 11/03/20 1551   10/26/20 1000  vancomycin (VANCOCIN) IVPB 1000 mg/200 mL premix  Status:  Discontinued        1,000 mg 200 mL/hr over 60 Minutes Intravenous Every 12 hours 10/25/20 2037 10/28/20 1344   10/25/20 2200  ceFEPIme (MAXIPIME) 2 g in sodium chloride 0.9 % 100 mL IVPB  Status:  Discontinued        2 g 200 mL/hr over 30 Minutes Intravenous Every 8 hours 10/25/20 2037 11/01/20 0816   10/25/20 2030  vancomycin (VANCOREADY) IVPB 2000 mg/400 mL        2,000 mg 200 mL/hr over 120 Minutes Intravenous NOW 10/25/20 2027 10/25/20 2248   10/25/20 1930  vancomycin (VANCOCIN) IVPB 1000 mg/200 mL premix  Status:  Discontinued        1,000 mg 200 mL/hr over 60 Minutes Intravenous  Once 10/25/20 1928 10/25/20 2136   10/25/20 1930  cefTRIAXone (ROCEPHIN) 2 g in sodium chloride 0.9 % 100 mL IVPB        2 g 200 mL/hr over 30 Minutes Intravenous  Once 10/25/20 1928 10/25/20 2040         Medications  Scheduled Meds: . amiodarone  200 mg Oral Daily  . apixaban  5 mg Oral  BID  . atorvastatin  40 mg Oral Daily  . baclofen  5 mg Oral TID  . Chlorhexidine Gluconate Cloth  6 each Topical Daily  . cholecalciferol  5,000 Units Oral Q1200  . donepezil  5 mg Oral QHS  . feeding supplement (GLUCERNA SHAKE)  237 mL Oral TID BM  . ferrous sulfate  325 mg Oral BID WC  . finasteride  5 mg Oral Q1200  . furosemide  40 mg Oral Daily  . gabapentin  300 mg Oral TID  . Gerhardt's butt cream   Topical QID  . insulin aspart  0-15 Units Subcutaneous TID WC  . insulin aspart  0-5 Units Subcutaneous QHS  . insulin glargine  10 Units Subcutaneous QHS  . loratadine  5 mg Oral QHS  . losartan  25 mg Oral Daily  . memantine  5 mg Oral BID  . metoprolol succinate  50 mg Oral Daily  . pantoprazole  40 mg Oral Daily  . potassium chloride  20 mEq Oral Daily  . pregabalin  25 mg Oral Daily  . sodium chloride flush  10-40 mL Intracatheter Q12H   Continuous Infusions: PRN Meds:.acetaminophen, levalbuterol, ondansetron (ZOFRAN) IV, oxyCODONE, sodium chloride flush      Subjective:   [name] was seen and examined today.  No shortness of breath, chest pain or dizziness.  No fevers or chills.  Only complaint is pain in the hips.    Objective:   Vitals:   11/19/20 0300 11/19/20 0301 11/19/20 0700 11/19/20 1100  BP: 114/74  (!) 137/96   Pulse: 90     Resp: 11 14    Temp: 97.7 F (36.5 C)  98.1 F (36.7 C) 98.5 F (36.9 C)  TempSrc: Oral  Oral   SpO2: 95%     Weight:      Height:        Intake/Output Summary (Last 24 hours) at 11/19/2020 1251 Last data filed at 11/19/2020  1100 Gross per 24 hour  Intake 840 ml  Output 926 ml  Net -86 ml     Wt Readings from Last 3 Encounters:  10/27/20 110.3 kg  05/31/20 114.2 kg  03/25/20 112.8 kg    Physical Exam  General: Alert and oriented x 3, NAD  Cardiovascular: S1 S2 clear, RRR.   Respiratory: CTA B, no wheezing  Gastrointestinal: Soft, nontender, nondistended, NBS  Ext: left AKA  Neuro: no new  deficits  Musculoskeletal: No cyanosis, clubbing  Skin: No rashes  Psych: Normal affect and demeanor, alert and oriented x3    Data Reviewed:  I have personally reviewed following labs and imaging studies  Micro Results Recent Results (from the past 240 hour(s))  SARS Coronavirus 2 by RT PCR (hospital order, performed in [facility] hospital lab) Nasopharyngeal Nasopharyngeal Swab     Status: None   Collection Time: 11/10/20  1:04 PM   Specimen: Nasopharyngeal Swab  Result Value Ref Range Status   SARS Coronavirus 2 NEGATIVE NEGATIVE Final    Comment: (NOTE) SARS-CoV-2 target nucleic acids are NOT DETECTED.  The SARS-CoV-2 RNA is generally detectable in upper and lower respiratory specimens during the acute phase of infection. The lowest concentration of SARS-CoV-2 viral copies this assay can detect is 250 copies / mL. A negative result does not preclude SARS-CoV-2 infection and should not be used as the sole basis for treatment or other patient management decisions.  A negative result may occur with improper specimen collection / handling, submission of specimen other than nasopharyngeal swab, presence of viral mutation(s) within the areas targeted by this assay, and inadequate number of viral copies (<250 copies / mL). A negative result must be combined with clinical observations, patient history, and epidemiological information.  Fact Sheet for Patients:   StrictlyIdeas.no  Fact Sheet for Healthcare Providers: BankingDealers.co.za  This test is not yet approved or  cleared by the [place] FDA and has been authorized for detection and/or diagnosis of SARS-CoV-2 by FDA under an Emergency Use Authorization (EUA).  This EUA will remain in effect (meaning this test can be used) for the duration of the COVID-19 declaration under Section 564(b)(1) of the Act, 21 U.S.C. section 360bbb-3(b)(1), unless the authorization is  terminated or revoked sooner.  Performed at [facility], [place] [address], [place], [place] [address]   Culture, blood (routine x 2)     Status: None   Collection Time: 11/12/20  8:31 AM   Specimen: BLOOD RIGHT HAND  Result Value Ref Range Status   Specimen Description BLOOD RIGHT HAND  Final   Special Requests AEROBIC BOTTLE ONLY Blood Culture adequate volume  Final   Culture   Final    NO GROWTH 5 DAYS Performed at [facility], [place] [address] [place], [place] [address]    Report Status 11/17/2020 FINAL  Final  Culture, blood (routine x 2)     Status: None   Collection Time: 11/12/20  8:31 AM   Specimen: BLOOD RIGHT HAND  Result Value Ref Range Status   Specimen Description BLOOD RIGHT HAND  Final   Special Requests AEROBIC BOTTLE ONLY Blood Culture adequate volume  Final   Culture   Final    NO GROWTH 5 DAYS Performed at [facility], [place] [address], [place], [place] [address]    Report Status 11/17/2020 FINAL  Final  SARS CORONAVIRUS 2 (TAT 6-24 HRS) Nasopharyngeal Nasopharyngeal Swab     Status: Abnormal   Collection  Time: 11/18/20  2:46 PM   Specimen: Nasopharyngeal Swab  Result Value Ref Range Status   SARS Coronavirus 2 POSITIVE (A) NEGATIVE Final    Comment: (NOTE) SARS-CoV-2 target nucleic acids are DETECTED.  The SARS-CoV-2 RNA is generally detectable in upper and lower respiratory specimens during the acute phase of infection. Positive results are indicative of the presence of SARS-CoV-2 RNA. Clinical correlation with patient history and other diagnostic information is  necessary to determine patient infection status. Positive results do not rule out bacterial infection or co-infection with other viruses.  The expected result is Negative.  Fact Sheet for Patients: SugarRoll.be  Fact Sheet for Healthcare Providers: https://www.woods-mathews.com/  This test is not yet approved or cleared by the  [place] FDA and  has been authorized for detection and/or diagnosis of SARS-CoV-2 by FDA under an Emergency Use Authorization (EUA). This EUA will remain  in effect (meaning this test can be used) for the duration of the COVID-19 declaration under Section 564(b)(1) of the Act, 21 U. S.C. section 360bbb-3(b)(1), unless the authorization is terminated or revoked sooner.   Performed at [facility], [place] [address] [place], [place] [address]     Radiology Reports DG Chest 1 View  Result Date: 10/29/2020 CLINICAL DATA:  70 year old male with sepsis. Lower extremity gangrene. Shortness breath. Negative for COVID-19/influenza 10/25/2020. EXAM: CHEST  1 VIEW COMPARISON:  CTA abdomen 10/28/2020. Portable chest 10/26/2020 and earlier. FINDINGS: Portable AP semi upright view at 0905 hours. New right PICC line placed, tip at the cavoatrial junction. Compared to 2019 radiographs coarse bilateral pulmonary interstitial opacity is widespread, not significantly changed from 10/26/2020. And lung bases had an inflammatory appearance on the CTA yesterday. Small to moderate superimposed layering pleural effusions demonstrated on the CTA are not apparent. No pneumothorax. No consolidation. Stable cardiac size and mediastinal contours. Prior CABG. Visualized tracheal air column is within normal limits. No acute osseous abnormality identified. IMPRESSION: 1. New right PICC line, tip at the cavoatrial junction. 2. Widespread coarse pulmonary interstitial opacity is stable since 10/26/2020, and appeared inflammatory at the lung bases on the CTA yesterday. Favor acute viral/atypical respiratory infection. 3. Small to moderate layering pleural effusions on CTA yesterday are occult by x-ray. Electronically Signed   By: Genevie Ann M.D.   On: 10/29/2020 09:13   DG Chest 1 View  Result Date: 10/26/2020 CLINICAL DATA:  Hypertension EXAM: CHEST  1 VIEW COMPARISON:  September 16, 2018 FINDINGS: There are chronic  bronchitic changes at the lung bases bilaterally. There is cardiomegaly. The patient is status post prior median sternotomy. There are prominent interstitial lung markings at the lung bases suggestive of interstitial edema. There is no pneumothorax or large pleural effusion. No acute osseous abnormality. IMPRESSION: 1. Cardiomegaly with mild interstitial edema. 2. Chronic bronchitic changes at the lung bases bilaterally. Electronically Signed   By: Constance Holster M.D.   On: 10/26/2020 04:49   CT ANGIO AO+BIFEM W & OR WO CONTRAST  Result Date: 10/28/2020 CLINICAL DATA:  Sepsis, gangrene of the left foot and evidence of peripheral vascular disease. EXAM: CT ANGIOGRAPHY OF ABDOMINAL AORTA WITH ILIOFEMORAL RUNOFF TECHNIQUE: Multidetector CT imaging of the abdomen, pelvis and lower extremities was performed using the standard protocol during bolus administration of intravenous contrast. Multiplanar CT image reconstructions and MIPs were obtained to evaluate the vascular anatomy. CONTRAST:  161mL OMNIPAQUE IOHEXOL 350 MG/ML SOLN COMPARISON:  None. FINDINGS: VASCULAR Aorta: Diffuse atherosclerosis without evidence of aneurysm, occlusion or significant aortic stenosis. No  evidence of dissection. Celiac: Calcified plaque at the origin of the celiac axis without significant stenosis. Distal branches are patent. SMA: Heavily calcified plaque at the origin of the SMA likely causing significant stenosis 70-80% caliber. Renals: Three separate right renal arteries and single left renal artery demonstrate atherosclerosis without high-grade stenoses. IMA: Patent. RIGHT Lower Extremity Inflow: Heavily calcified plaque throughout the iliac arteries. Common and external iliac arteries demonstrate no significant stenosis or aneurysmal disease. Outflow: Calcified plaque in the common femoral artery without significant stenosis. Profunda femoral artery is patent. Diffuse disease throughout the SFA with maximal stenosis approaching  50-70%. There is an indwelling stent present at the level of the mid to distal SFA which appears patent. Popliteal artery demonstrates diffuse plaque without high-grade stenosis. Runoff: Diffuse disease throughout tibial arteries. The posterior tibial artery appears to be likely continuously patent into the foot. The anterior tibial artery is likely subtotally occluded in several segments but may be essentially patent into the foot. The peroneal artery is also similarly significantly diseased but may be continuously patent. LEFT Lower Extremity Inflow: Iliac arteries demonstrate diffuse calcified plaque. Common and external iliac arteries demonstrate no significant focal stenosis or evidence of aneurysmal disease. Outflow: Common femoral artery shows diffuse disease without significant stenosis. The profunda femoral artery is open and calcified. The left superficial femoral artery demonstrates initial patency and then becomes occluded in the proximal to mid thigh. There is reconstitution at the SFA/popliteal junction. The popliteal artery is heavily diseased and diffusely stenotic. Runoff: There is severe disease of the tibioperoneal trunk which is severely calcified. All 3 runoff vessels below the knee demonstrate significant calcification with posterior tibial and peroneal arteries demonstrating segments of occlusion beginning in the proximal calf. The anterior tibial artery is diseased but appears to be likely continuously patent into the foot. There is very faint reconstitution of plantar branches in the distal posterior tibial territory. Review of the MIP images confirms the above findings. NON-VASCULAR Lower chest: Small bilateral pleural effusions. Lungs demonstrate significant areas of patchy airspace disease bilaterally consistent with pneumonia. Hepatobiliary: Liver surface is slightly nodular and early cirrhosis suspected. The gallbladder appears unremarkable. Pancreas: Unremarkable. No pancreatic ductal  dilatation or surrounding inflammatory changes. Spleen: Normal in size without focal abnormality. Adrenals/Urinary Tract: Bilateral renal cysts appear benign. The bladder is unremarkable. Stomach/Bowel: Bowel shows no evidence of obstruction, inflammation or lesion. No free air identified. Lymphatic: No enlarged lymph nodes identified in the abdomen or pelvis. Reproductive: Prostate is unremarkable. Other: No abdominal wall hernia or abnormality. No abdominopelvic ascites. Musculoskeletal: No acute or significant osseous findings. IMPRESSION: 1. Significant bilateral patchy airspace disease in both lungs consistent with pneumonia. Small bilateral pleural effusions. 2. 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 3. Diffuse disease throughout the right SFA with maximal stenosis approaching 50-70%. Patent stent at the level of the mid to distal SFA. Continuously patent posterior tibial runoff on the right and diseased anterior tibial and peroneal runoff. 4. Occlusion of the left superficial femoral artery in the proximal to mid thigh with reconstitution at the SFA/popliteal junction. 5. Severe disease of the left tibioperoneal trunk and segments of occlusions of the left posterior tibial and peroneal arteries. The left anterior tibial artery is diseased but appears to be continuously patent into the foot. There is very faint reconstitution of plantar branches in the distal posterior tibial territory. 6. Probable early cirrhosis. 7. Aortic atherosclerosis. Aortic Atherosclerosis (ICD10-I70.0). Electronically Signed   By: Aletta Edouard M.D.   On: 10/28/2020 16:05   DG  Chest Port 1 View  Result Date: 11/12/2020 CLINICAL DATA:  Fever.  Sepsis. EXAM: PORTABLE CHEST 1 VIEW COMPARISON:  10/29/2020.  10/26/2020. FINDINGS: PICC line noted stable position. Prior CABG. Cardiomegaly. Bilateral interstitial infiltrates/edema again noted with interim improvement from prior exam. Tiny bilateral pleural effusions cannot be excluded. Right  costophrenic angle incompletely imaged. No pneumothorax. Deformity again noted of the proximal left humerus. Carotid and peripheral vascular calcification noted. IMPRESSION: 1. PICC line noted in stable position. 2. Prior CABG. Cardiomegaly. Bilateral interstitial infiltrates/edema again noted with interim improvement from prior exam. Tiny bilateral pleural effusions cannot be excluded. 3. Carotid and peripheral vascular disease. Electronically Signed   By: Marcello Moores  Register   On: 11/12/2020 08:28   DG ABD ACUTE 2+V W 1V CHEST  Result Date: 10/27/2020 CLINICAL DATA:  Emesis. EXAM: DG ABDOMEN ACUTE WITH 1 VIEW CHEST COMPARISON:  02/09/2013 and chest x-ray 10/26/2020 FINDINGS: Patient combative as best images obtained. Sternotomy wires unchanged. Right-sided PICC line has tip over the SVC. Lungs are adequately inflated demonstrate patchy bilateral airspace opacification which may be due to multifocal infection. No definite effusion. Mild stable cardiomegaly. Abdominopelvic images demonstrate air throughout the colon. There multiple air-filled nondilated small bowel loops present. No free peritoneal air. Degenerative changes of the spine and hips. There are surgical clips over the pelvis. IMPRESSION: 1. Nonspecific, nonobstructive bowel gas pattern. 2. Patchy bilateral airspace process which may be due to multifocal infection. Electronically Signed   By: Marin Olp M.D.   On: 10/27/2020 16:00   DG Foot Complete Left  Result Date: 10/25/2020 CLINICAL DATA:  Ulcer to the great toe and heel EXAM: LEFT FOOT - COMPLETE 3+ VIEW COMPARISON:  05/21/2015 FINDINGS: No acute displaced fracture or malalignment is seen. Large ulcer on the medial side of the first digit. Cortical erosive change on the medial side of first distal phalanx and at the head of the first proximal phalanx suspicious for osteomyelitis. Chronic fracture deformity first proximal phalanx. Sclerosis and mild deformity at the necks of the fourth and  fifth metatarsals, suspicious for subacute to developing chronic fractures. No erosive change at the calcaneus. No soft tissue emphysema. Vascular calcifications. IMPRESSION: 1. Cortical erosive change on the medial side of the first distal phalanx and head of the first proximal phalanx suspicious for osteomyelitis. Large overlying ulcer medial side first digit 2. Probable subacute to developing chronic fractures involving the necks of the fourth and fifth metatarsals. Electronically Signed   By: Donavan Foil M.D.   On: 10/25/2020 19:11   ECHOCARDIOGRAM COMPLETE  Result Date: 10/29/2020    ECHOCARDIOGRAM REPORT   Patient Name:   ALGER KERSTEIN Date of Exam: 10/29/2020 Medical Rec #:  [id number]      Height:       67.0 in Accession #:    [id number]     Weight:       243.2 lb Date of Birth:  1950/12/30      BSA:          2.197 m Patient Age:    70 years       BP:           119/97 mmHg Patient Gender: M              HR:           103 bpm. Exam Location:  Inpatient Procedure: 2D Echo, Cardiac Doppler, Color Doppler and Intracardiac            Opacification Agent Indications:  Dyspnea  History:        Patient has prior history of Echocardiogram examinations, most                 recent 09/16/2018. CAD, Arrythmias:Atrial Flutter; Risk                 Factors:Current Smoker, Hypertension, Dyslipidemia, Sleep Apnea                 and Diabetes. H/O stroke. PAD. Ischemic cardiomyopathy.  Sonographer:    Clayton Lefort RDCS (AE) Referring Phys: [phone] [facility]  Sonographer Comments: Technically difficult study due to poor echo windows, suboptimal parasternal window, suboptimal apical window and no subcostal window. Image acquisition challenging due to patient body habitus. IMPRESSIONS  1. Left ventricular ejection fraction, by estimation, is 25 to 30%. The left ventricle has severely decreased function. The left ventricle demonstrates global hypokinesis. The left ventricular internal cavity size was mildly dilated.  There is mild left ventricular hypertrophy. Left ventricular diastolic parameters are indeterminate.  2. Right ventricular systolic function is normal. The right ventricular size is normal.  3. Left atrial size was mildly dilated.  4. The mitral valve is normal in structure. Mild mitral valve regurgitation. No evidence of mitral stenosis.  5. The aortic valve is tricuspid. Aortic valve regurgitation is not visualized. Mild aortic valve sclerosis is present, with no evidence of aortic valve stenosis.  6. The inferior vena cava is normal in size with greater than 50% respiratory variability, suggesting right atrial pressure of 3 mmHg. FINDINGS  Left Ventricle: Left ventricular ejection fraction, by estimation, is 25 to 30%. The left ventricle has severely decreased function. The left ventricle demonstrates global hypokinesis. Definity contrast agent was given IV to delineate the left ventricular endocardial borders. The left ventricular internal cavity size was mildly dilated. There is mild left ventricular hypertrophy. Left ventricular diastolic function could not be evaluated due to atrial fibrillation. Left ventricular diastolic parameters are indeterminate. Right Ventricle: The right ventricular size is normal.Right ventricular systolic function is normal. Left Atrium: Left atrial size was mildly dilated. Right Atrium: Right atrial size was normal in size. Pericardium: There is no evidence of pericardial effusion. Mitral Valve: The mitral valve is normal in structure. Mild mitral annular calcification. Mild mitral valve regurgitation. No evidence of mitral valve stenosis. Tricuspid Valve: The tricuspid valve is normal in structure. Tricuspid valve regurgitation is not demonstrated. No evidence of tricuspid stenosis. Aortic Valve: The aortic valve is tricuspid. Aortic valve regurgitation is not visualized. Mild aortic valve sclerosis is present, with no evidence of aortic valve stenosis. Aortic valve mean gradient  measures 2.0 mmHg. Aortic valve peak gradient measures 3.4 mmHg. Aortic valve area, by VTI measures 2.07 cm. Pulmonic Valve: The pulmonic valve was not well visualized. Pulmonic valve regurgitation is not visualized. No evidence of pulmonic stenosis. Aorta: The aortic root is normal in size and structure. Venous: The inferior vena cava is normal in size with greater than 50% respiratory variability, suggesting right atrial pressure of 3 mmHg.  LEFT VENTRICLE PLAX 2D LVIDd:         5.50 cm LVIDs:         4.80 cm LV PW:         1.10 cm LV IVS:        1.20 cm LVOT diam:     2.20 cm LV SV:         29 LV SV Index:   13 LVOT Area:  3.80 cm  IVC IVC diam: 1.90 cm LEFT ATRIUM             Index LA diam:        4.80 cm 2.18 cm/m LA Vol (A2C):   66.2 ml 30.13 ml/m LA Vol (A4C):   79.7 ml 36.27 ml/m LA Biplane Vol: 74.3 ml 33.81 ml/m  AORTIC VALVE AV Area (Vmax):    2.10 cm AV Area (Vmean):   2.00 cm AV Area (VTI):     2.07 cm AV Vmax:           91.92 cm/s AV Vmean:          67.220 cm/s AV VTI:            0.141 m AV Peak Grad:      3.4 mmHg AV Mean Grad:      2.0 mmHg LVOT Vmax:         50.88 cm/s LVOT Vmean:        35.380 cm/s LVOT VTI:          0.077 m LVOT/AV VTI ratio: 0.55  AORTA Ao Root diam: 3.90 cm Ao Asc diam:  3.80 cm  SHUNTS Systemic VTI:  0.08 m Systemic Diam: 2.20 cm Kirk Ruths MD Electronically signed by Kirk Ruths MD Signature Date/Time: 10/29/2020/2:15:31 PM    Final    VAS [place] UPPER EXTREMITY VENOUS DUPLEX  Result Date: 11/13/2020 UPPER VENOUS STUDY  Indications: Swelling RT arm, PICC line Limitations: Bandaging around PICC insertion. Comparison Study: No prior studies. Performing Technologist: Darlin Coco RDMS  Examination Guidelines: A complete evaluation includes B-mode imaging, spectral Doppler, color Doppler, and power Doppler as needed of all accessible portions of each vessel. Bilateral testing is considered an integral part of a complete examination. Limited examinations for  reoccurring indications may be performed as noted.  Right Findings: +----------+------------+---------+-----------+----------+---------------------+ RIGHT     CompressiblePhasicitySpontaneousProperties       Summary        +----------+------------+---------+-----------+----------+---------------------+ IJV           Full       Yes       Yes                                    +----------+------------+---------+-----------+----------+---------------------+ Subclavian    Full       Yes       Yes                                    +----------+------------+---------+-----------+----------+---------------------+ Axillary      Full       Yes       Yes                                    +----------+------------+---------+-----------+----------+---------------------+ Brachial      Full       Yes       Yes                Some segments not  well visualized due                                                          to bandaging-                                                          visualized segments                                                             patent         +----------+------------+---------+-----------+----------+---------------------+ Radial        Full                                                        +----------+------------+---------+-----------+----------+---------------------+ Ulnar         Full                                                        +----------+------------+---------+-----------+----------+---------------------+ Cephalic    Partial      Yes       Yes                Age Indeterminate   +----------+------------+---------+-----------+----------+---------------------+ Basilic                                              Not well visualized                                                        due to bandaging     +----------+------------+---------+-----------+----------+---------------------+  Left Findings: +----------+------------+---------+-----------+----------+-------+ LEFT      CompressiblePhasicitySpontaneousPropertiesSummary +----------+------------+---------+-----------+----------+-------+ Subclavian    Full       Yes       Yes                      +----------+------------+---------+-----------+----------+-------+  Summary:  Right: No evidence of deep vein thrombosis in the upper extremity. Findings consistent with age indeterminate superficial vein thrombosis involving the right cephalic vein. However, unable to visualize the basilic vein along with some segments of the brachial vein at the PICC insertion.  Left: No evidence of thrombosis in the subclavian.  *See table(s) above for measurements and observations.  Diagnosing physician: Harold Barban MD Electronically signed by Harold Barban MD  on 11/13/2020 at 7:12:19 PM.    Final    [place] EKG SITE RITE  Result Date: 10/27/2020 If Site Rite image not attached, placement could not be confirmed due to current cardiac rhythm.   Lab Data:  CBC: Recent Labs  Lab 11/13/20 0553 11/14/20 0439 11/15/20 0534 11/16/20 0250 11/18/20 0725  WBC 11.0* 8.9 11.5* 9.7 10.6*  HGB 8.7* 9.9* 8.9* 10.0* 10.7*  HCT 28.2* 30.4* 27.4* 30.5* 34.4*  MCV 99.3 97.1 97.2 96.8 97.5  PLT 183 203 181 222 119   Basic Metabolic Panel: Recent Labs  Lab 11/13/20 0553 11/14/20 0439 11/16/20 0250  NA 135 137 137  K 3.7 3.8 4.0  CL 104 103 104  CO2 25 24 24   GLUCOSE 146* 131* 161*  BUN 11 11 9   CREATININE 0.79 0.78 0.69  CALCIUM 7.7* 7.9* 8.0*   GFR: Estimated Creatinine Clearance: 104.8 mL/min (by C-G formula based on SCr of 0.69 mg/dL). Liver Function Tests: No results for input(s): AST, ALT, ALKPHOS, BILITOT, PROT, ALBUMIN in the last 168 hours. No results for input(s): LIPASE, AMYLASE in the last 168 hours. No results for input(s): AMMONIA in the  last 168 hours. Coagulation Profile: No results for input(s): INR, PROTIME in the last 168 hours. Cardiac Enzymes: Recent Labs  Lab 11/14/20 0439  CKTOTAL 134   BNP (last 3 results) No results for input(s): PROBNP in the last 8760 hours. HbA1C: No results for input(s): HGBA1C in the last 72 hours. CBG: Recent Labs  Lab 11/18/20 1150 11/18/20 1556 11/18/20 2215 11/19/20 0756 11/19/20 1102  GLUCAP 196* 288* 156* 145* 240*   Lipid Profile: No results for input(s): CHOL, HDL, LDLCALC, TRIG, CHOLHDL, LDLDIRECT in the last 72 hours. Thyroid Function Tests: No results for input(s): TSH, T4TOTAL, FREET4, T3FREE, THYROIDAB in the last 72 hours. Anemia Panel: No results for input(s): VITAMINB12, FOLATE, FERRITIN, TIBC, IRON, RETICCTPCT in the last 72 hours. Urine analysis:    Component Value Date/Time   COLORURINE AMBER (A) 11/12/2020 1717   APPEARANCEUR CLEAR 11/12/2020 1717   LABSPEC >1.030 (H) 11/12/2020 1717   PHURINE 6.0 11/12/2020 1717   GLUCOSEU NEGATIVE 11/12/2020 1717   HGBUR TRACE (A) 11/12/2020 1717   BILIRUBINUR NEGATIVE 11/12/2020 1717   KETONESUR NEGATIVE 11/12/2020 1717   PROTEINUR 30 (A) 11/12/2020 1717   UROBILINOGEN 0.2 08/20/2014 2042   NITRITE POSITIVE (A) 11/12/2020 1717   LEUKOCYTESUR NEGATIVE 11/12/2020 1717     Cordae Mccarey M.D. [facility] 11/19/2020, 12:51 PM   Call night coverage person covering after 7pm

## 2020-11-19 NOTE — Plan of Care (Signed)
  Problem: Education: Goal: Knowledge of General Education information will improve Description Including pain rating scale, medication(s)/side effects and non-pharmacologic comfort measures Outcome: Progressing   

## 2020-11-20 DIAGNOSIS — E11621 Type 2 diabetes mellitus with foot ulcer: Secondary | ICD-10-CM | POA: Diagnosis not present

## 2020-11-20 DIAGNOSIS — I484 Atypical atrial flutter: Secondary | ICD-10-CM

## 2020-11-20 DIAGNOSIS — M86172 Other acute osteomyelitis, left ankle and foot: Secondary | ICD-10-CM | POA: Diagnosis not present

## 2020-11-20 DIAGNOSIS — A419 Sepsis, unspecified organism: Secondary | ICD-10-CM | POA: Diagnosis not present

## 2020-11-20 LAB — CBC
HCT: 35.8 % — ABNORMAL LOW (ref 39.0–52.0)
Hemoglobin: 11.2 g/dL — ABNORMAL LOW (ref 13.0–17.0)
MCH: 31.2 pg (ref 26.0–34.0)
MCHC: 31.3 g/dL (ref 30.0–36.0)
MCV: 99.7 fL (ref 80.0–100.0)
Platelets: 299 10*3/uL (ref 150–400)
RBC: 3.59 MIL/uL — ABNORMAL LOW (ref 4.22–5.81)
RDW: 17.9 % — ABNORMAL HIGH (ref 11.5–15.5)
WBC: 10.9 10*3/uL — ABNORMAL HIGH (ref 4.0–10.5)
nRBC: 0 % (ref 0.0–0.2)

## 2020-11-20 LAB — COMPREHENSIVE METABOLIC PANEL
ALT: 68 U/L — ABNORMAL HIGH (ref 0–44)
AST: 51 U/L — ABNORMAL HIGH (ref 15–41)
Albumin: 2.2 g/dL — ABNORMAL LOW (ref 3.5–5.0)
Alkaline Phosphatase: 120 U/L (ref 38–126)
Anion gap: 10 (ref 5–15)
BUN: 21 mg/dL (ref 8–23)
CO2: 23 mmol/L (ref 22–32)
Calcium: 8.3 mg/dL — ABNORMAL LOW (ref 8.9–10.3)
Chloride: 101 mmol/L (ref 98–111)
Creatinine, Ser: 1.11 mg/dL (ref 0.61–1.24)
GFR, Estimated: >60 mL/min (ref >60)
Glucose, Bld: 181 mg/dL — ABNORMAL HIGH (ref 70–99)
Potassium: 4.8 mmol/L (ref 3.5–5.1)
Sodium: 134 mmol/L — ABNORMAL LOW (ref 135–145)
Total Bilirubin: 1 mg/dL (ref 0.3–1.2)
Total Protein: 7 g/dL (ref 6.5–8.1)

## 2020-11-20 LAB — GLUCOSE, CAPILLARY
Glucose-Capillary: 115 mg/dL — ABNORMAL HIGH (ref 70–99)
Glucose-Capillary: 127 mg/dL — ABNORMAL HIGH (ref 70–99)
Glucose-Capillary: 181 mg/dL — ABNORMAL HIGH (ref 70–99)

## 2020-11-20 NOTE — Progress Notes (Signed)
[facility] PROGRESS NOTE    Progress Note  [name]  KZS:[id number] DOB: [birth year] DOA: 10/25/2020 PCP: Leeanne Rio, MD     Brief Narrative:   [name] is an 70 y.o. male past medical history of essential hypertension, diabetes mellitus type 2, a flutter with a history of stroke with left hemiparesis admitted for left wound from a recent trauma [facility].  Vascular surgery was consulted recommended to transfer to [facility] done on 10/28/2020, vascular surgery recommended amputation with the patient refused. Hospital admission was complicated by acute hypoxic respiratory failure thought to be secondary to acute on chronic systolic heart failure cardiology was consulted and since he initially refused amputation they recommended moving towards hospice care. After family meeting with palliative on 11/04/2020 the patient wife proceeded with amputation.  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 Currently the social worker is working on placement.  Significant Events: Admission to the hospital on 10/25/2020 Transfer to: 10/28/2018  Significant studies: None  Antibiotics: Flagyl daptomycin and Rocephin which were stopped 2-2 2022 he completed 14-day treatment was stopped after amputation.  Microbiology data: 11/12/2020 blood culture: Negative till date  Procedures: 11/11/2020 above-the-knee amputation by vascular surgery  Assessment/Plan:   Sepsis --due to Infected Lt Foot/Ostepmyleitis---POA: He has completed his antibiotic coverage after discussing with ID Dr. De Burrs antibiotics were discontinued after amputation. 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 He is spiking fevers intermittently lower extremity Doppler was negative for DVT, but has superficial thrombosis of the right cephalic vein.  COVID-19 positivity likely  incidental: Not hypoxic, due to his multiple risk factors he was treated empirically with 3 days of IV remdesivir. Will need 10 days of isolation.  Acute metabolic encephalopathy: Multifactorial due to postop and polypharmacy. It is now improved only complains of pain. Palliative care to continue to assist with pain management.  Acute blood loss anemia: Likely due to surgery, hemoglobin this morning is 11.2. We will stop checking CBCs.  Chronic combined systolic and diastolic heart failure: With an echo showing an EF of 25%. Cardiology recommended no invasive work-up, continue Toprol losartan and Lasix daily. Patient appears euvolemic.  Persistent atrial flutter (HCC) Continue amiodarone Toprol and Eliquis.  Ischemic cardiomyopathy: Cardiology was consulted recommended Eliquis, Plavix was discontinued no further ischemic work-up. Continue Toprol losartan Lasix.  Acute respiratory failure with hypoxia: Postoperative now resolved.  Diabetes mellitus type 2: With an A1c of 6.9, blood glucose fairly controlled continue current regimen.  History of CVA with left hemiparesis: Continue Eliquis.  Chronic kidney disease stage II: Creatinine at baseline.  Dementia: Continue Aricept, Namenda.  BPH: Continue finasteride.   DVT prophylaxis: Eliquis Family Communication:wife Status is: Inpatient  Remains inpatient appropriate because:Hemodynamically unstable   Dispo: The patient is from: Home              Anticipated d/c is to: SNF              Anticipated d/c date is: 3 days              Patient currently is medically stable to d/c.   Difficult to place patient Yes        Code Status:     Code Status Orders  (From admission, onward)         Start     Ordered   11/09/20 [phone]  Full code  Continuous        11/09/20 0938        Code Status History    Date Active Date Inactive Code Status Order ID Comments User Context   11/01/2020 1455 11/09/2020 0938 DNR  [id number]  Rosezella Rumpf, NP Inpatient   10/31/2020 1439 11/01/2020 1455 Full Code [id number]  Florencia Reasons, MD Inpatient   10/31/2020 1214 10/31/2020 1439 DNR [id number]  Florencia Reasons, MD Inpatient   10/25/2020 2246 10/31/2020 1214 Full Code [id number]  Bernadette Hoit, DO ED   04/08/2015 1151 04/08/2015 2143 Full Code [id number]  Angelia Mould, MD Inpatient   06/30/2014 1428 07/01/2014 1905 Full Code [id number]  Samuella Cota, MD Inpatient   01/12/2013 0804 01/19/2013 1823 Full Code [id number]  Grace Isaac, MD Inpatient   01/09/2013 1430 01/12/2013 0804 Full Code [id number]  Nani Skillern, PA-C Inpatient   Advance Care Planning Activity        IV Access:    Peripheral IV   Procedures and diagnostic studies:   DG Chest Port 1 View  Result Date: 11/19/2020 CLINICAL DATA:  Reported COVID-19 positive EXAM: PORTABLE CHEST 1 VIEW COMPARISON:  November 12, 2020 and October 29, 2020 chest radiographs. FINDINGS: There has been clearing of airspace opacity bilaterally. Subtle interstitial thickening is noted in the bases. No consolidation. Heart is mildly enlarged, stable. Pulmonary vascularity is normal. No adenopathy. Patient is status post coronary artery bypass grafting. Bones are osteoporotic. Prior rib fractures noted on the left. IMPRESSION: Subtle interstitial thickening in the bases. This interstitial thickening may represent residua of COVID 19 pneumonitis. There may also be a degree of underlying bronchitis. No airspace consolidation noted. Stable cardiac prominence. No adenopathy evident. Bones osteoporotic. Electronically Signed   By: Lowella Grip III M.D.   On: 11/19/2020 13:35     Medical Consultants:    None.  Anti-Infectives:   None  Subjective:    [name] relates he is hungry this morning has no new complaints.  Objective:    Vitals:   11/19/20 2300 11/19/20 2337 11/20/20 0300 11/20/20 0747  BP: (!) 87/46 98/69 112/72 99/61  Pulse: 80 91 92 96   Resp: 12 17 15 16   Temp:  98.1 F (36.7 C) 98.5 F (36.9 C) 98.3 F (36.8 C)  TempSrc:  Axillary Oral Oral  SpO2: 92% 97% 95% 95%  Weight:      Height:       SpO2: 95 % O2 Flow Rate (L/min): 1 L/min FiO2 (%): 28 %   Intake/Output Summary (Last 24 hours) at 11/20/2020 0829 Last data filed at 11/19/2020 1857 Gross per 24 hour  Intake 360 ml  Output --  Net 360 ml   Filed Weights   10/25/20 1824 10/26/20 1850 10/27/20 0500  Weight: 116.1 kg 110.5 kg 110.3 kg    Exam: General exam: In no acute distress. Respiratory system: Good air movement and clear to auscultation. Cardiovascular system: S1 & S2 heard, RRR. No JVD. Gastrointestinal system: Abdomen is nondistended, soft and nontender.  Extremities: No pedal edema. Skin: No rashes, lesions or ulcers Psychiatry: Judgement and insight appear normal. Mood & affect appropriate.    Data Reviewed:    Labs: Basic Metabolic Panel: Recent Labs  Lab 11/14/20 0439 11/16/20 0250 11/20/20 0122  NA 137 137 134*  K 3.8 4.0 4.8  CL 103 104 101  CO2 24 24 23   GLUCOSE 131* 161* 181*  BUN 11 9 21   CREATININE 0.78 0.69  1.11  CALCIUM 7.9* 8.0* 8.3*   GFR Estimated Creatinine Clearance: 75.5 mL/min (by C-G formula based on SCr of 1.11 mg/dL). Liver Function Tests: Recent Labs  Lab 11/20/20 0122  AST 51*  ALT 68*  ALKPHOS 120  BILITOT 1.0  PROT 7.0  ALBUMIN 2.2*   No results for input(s): LIPASE, AMYLASE in the last 168 hours. No results for input(s): AMMONIA in the last 168 hours. Coagulation profile No results for input(s): INR, PROTIME in the last 168 hours. COVID-19 Labs  Recent Labs    11/19/20 1334  DDIMER 0.89*  CRP 1.7*    Lab Results  Component Value Date   SARSCOV2NAA POSITIVE (A) 11/19/2020   SARSCOV2NAA POSITIVE (A) 11/18/2020   [place] NEGATIVE 11/10/2020   [place] NEGATIVE 10/25/2020    CBC: Recent Labs  Lab 11/14/20 0439 11/15/20 0534 11/16/20 0250 11/18/20 0725 11/20/20 0122   WBC 8.9 11.5* 9.7 10.6* 10.9*  HGB 9.9* 8.9* 10.0* 10.7* 11.2*  HCT 30.4* 27.4* 30.5* 34.4* 35.8*  MCV 97.1 97.2 96.8 97.5 99.7  PLT 203 181 222 269 299   Cardiac Enzymes: Recent Labs  Lab 11/14/20 0439  CKTOTAL 134   BNP (last 3 results) No results for input(s): PROBNP in the last 8760 hours. CBG: Recent Labs  Lab 11/19/20 0756 11/19/20 1102 11/19/20 1608 11/19/20 2036 11/20/20 0745  GLUCAP 145* 240* 100* 136* 115*   D-Dimer: Recent Labs    11/19/20 1334  DDIMER 0.89*   Hgb A1c: No results for input(s): HGBA1C in the last 72 hours. Lipid Profile: No results for input(s): CHOL, HDL, LDLCALC, TRIG, CHOLHDL, LDLDIRECT in the last 72 hours. Thyroid function studies: No results for input(s): TSH, T4TOTAL, T3FREE, THYROIDAB in the last 72 hours.  Invalid input(s): FREET3 Anemia work up: No results for input(s): VITAMINB12, FOLATE, FERRITIN, TIBC, IRON, RETICCTPCT in the last 72 hours. Sepsis Labs: Recent Labs  Lab 11/15/20 0534 11/16/20 0250 11/18/20 0725 11/20/20 0122  WBC 11.5* 9.7 10.6* 10.9*   Microbiology Recent Results (from the past 240 hour(s))  SARS Coronavirus 2 by RT PCR (hospital order, performed in [facility] hospital lab) Nasopharyngeal Nasopharyngeal Swab     Status: None   Collection Time: 11/10/20  1:04 PM   Specimen: Nasopharyngeal Swab  Result Value Ref Range Status   SARS Coronavirus 2 NEGATIVE NEGATIVE Final    Comment: (NOTE) SARS-CoV-2 target nucleic acids are NOT DETECTED.  The SARS-CoV-2 RNA is generally detectable in upper and lower respiratory specimens during the acute phase of infection. The lowest concentration of SARS-CoV-2 viral copies this assay can detect is 250 copies / mL. A negative result does not preclude SARS-CoV-2 infection and should not be used as the sole basis for treatment or other patient management decisions.  A negative result may occur with improper specimen collection / handling, submission of specimen  other than nasopharyngeal swab, presence of viral mutation(s) within the areas targeted by this assay, and inadequate number of viral copies (<250 copies / mL). A negative result must be combined with clinical observations, patient history, and epidemiological information.  Fact Sheet for Patients:   StrictlyIdeas.no  Fact Sheet for Healthcare Providers: BankingDealers.co.za  This test is not yet approved or  cleared by the [place] FDA and has been authorized for detection and/or diagnosis of SARS-CoV-2 by FDA under an Emergency Use Authorization (EUA).  This EUA will remain in effect (meaning this test can be used) for the duration of the COVID-19 declaration under Section 564(b)(1) of the  Act, 21 U.S.C. section 360bbb-3(b)(1), unless the authorization is terminated or revoked sooner.  Performed at [facility], [place] [address], [place], [place] [address]   Culture, blood (routine x 2)     Status: None   Collection Time: 11/12/20  8:31 AM   Specimen: BLOOD RIGHT HAND  Result Value Ref Range Status   Specimen Description BLOOD RIGHT HAND  Final   Special Requests AEROBIC BOTTLE ONLY Blood Culture adequate volume  Final   Culture   Final    NO GROWTH 5 DAYS Performed at [facility], [place] [address] [place], [place] [address]    Report Status 11/17/2020 FINAL  Final  Culture, blood (routine x 2)     Status: None   Collection Time: 11/12/20  8:31 AM   Specimen: BLOOD RIGHT HAND  Result Value Ref Range Status   Specimen Description BLOOD RIGHT HAND  Final   Special Requests AEROBIC BOTTLE ONLY Blood Culture adequate volume  Final   Culture   Final    NO GROWTH 5 DAYS Performed at [facility], [place] [address], [place], [place] [address]    Report Status 11/17/2020 FINAL  Final  SARS CORONAVIRUS 2 (TAT 6-24 HRS) Nasopharyngeal Nasopharyngeal Swab     Status: Abnormal   Collection Time: 11/18/20  2:46 PM    Specimen: Nasopharyngeal Swab  Result Value Ref Range Status   SARS Coronavirus 2 POSITIVE (A) NEGATIVE Final    Comment: (NOTE) SARS-CoV-2 target nucleic acids are DETECTED.  The SARS-CoV-2 RNA is generally detectable in upper and lower respiratory specimens during the acute phase of infection. Positive results are indicative of the presence of SARS-CoV-2 RNA. Clinical correlation with patient history and other diagnostic information is  necessary to determine patient infection status. Positive results do not rule out bacterial infection or co-infection with other viruses.  The expected result is Negative.  Fact Sheet for Patients: SugarRoll.be  Fact Sheet for Healthcare Providers: https://www.woods-mathews.com/  This test is not yet approved or cleared by the [place] FDA and  has been authorized for detection and/or diagnosis of SARS-CoV-2 by FDA under an Emergency Use Authorization (EUA). This EUA will remain  in effect (meaning this test can be used) for the duration of the COVID-19 declaration under Section 564(b)(1) of the Act, 21 U. S.C. section 360bbb-3(b)(1), unless the authorization is terminated or revoked sooner.   Performed at [facility], [place] [address], [place], [place] [address]   Resp Panel by RT-PCR (Flu A&B, Covid) Nasopharyngeal Swab     Status: Abnormal   Collection Time: 11/19/20  4:11 PM   Specimen: Nasopharyngeal Swab; Nasopharyngeal(NP) swabs in vial transport medium  Result Value Ref Range Status   SARS Coronavirus 2 by RT PCR POSITIVE (A) NEGATIVE Final    Comment: RESULT CALLED TO, READ BACK BY AND VERIFIED WITH: Gordy Clement RN [id number] 11/19/20 A BROWNING (NOTE) SARS-CoV-2 target nucleic acids are DETECTED.  The SARS-CoV-2 RNA is generally detectable in upper respiratory specimens during the acute phase of infection. Positive results are indicative of the presence of the identified virus, but do not  rule out bacterial infection or co-infection with other pathogens not detected by the test. Clinical correlation with patient history and other diagnostic information is necessary to determine patient infection status. The expected result is Negative.  Fact Sheet for Patients: EntrepreneurPulse.com.au  Fact Sheet for Healthcare Providers: IncredibleEmployment.be  This test is not yet approved or cleared by the [place]  States FDA and  has been authorized for detection and/or diagnosis of SARS-CoV-2 by FDA under an Emergency Use Authorization (EUA).  This EUA will remain in effect (meaning this test can b e used) for the duration of  the COVID-19 declaration under Section 564(b)(1) of the Act, 21 U.S.C. section 360bbb-3(b)(1), unless the authorization is terminated or revoked sooner.     Influenza A by PCR NEGATIVE NEGATIVE Final   Influenza B by PCR NEGATIVE NEGATIVE Final    Comment: (NOTE) The Xpert Xpress SARS-CoV-2/FLU/RSV plus assay is intended as an aid in the diagnosis of influenza from Nasopharyngeal swab specimens and should not be used as a sole basis for treatment. Nasal washings and aspirates are unacceptable for Xpert Xpress SARS-CoV-2/FLU/RSV testing.  Fact Sheet for Patients: EntrepreneurPulse.com.au  Fact Sheet for Healthcare Providers: IncredibleEmployment.be  This test is not yet approved or cleared by the [place] FDA and has been authorized for detection and/or diagnosis of SARS-CoV-2 by FDA under an Emergency Use Authorization (EUA). This EUA will remain in effect (meaning this test can be used) for the duration of the COVID-19 declaration under Section 564(b)(1) of the Act, 21 U.S.C. section 360bbb-3(b)(1), unless the authorization is terminated or revoked.  Performed at [facility], [place] [address], [place], [place] [address]      Medications:   . amiodarone  200  mg Oral Daily  . apixaban  5 mg Oral BID  . atorvastatin  40 mg Oral Daily  . baclofen  5 mg Oral TID  . Chlorhexidine Gluconate Cloth  6 each Topical Daily  . cholecalciferol  5,000 Units Oral Q1200  . donepezil  5 mg Oral QHS  . feeding supplement (GLUCERNA SHAKE)  237 mL Oral TID BM  . ferrous sulfate  325 mg Oral BID WC  . finasteride  5 mg Oral Q1200  . furosemide  40 mg Oral Daily  . gabapentin  300 mg Oral TID  . Gerhardt's butt cream   Topical QID  . insulin aspart  0-15 Units Subcutaneous TID WC  . insulin aspart  0-5 Units Subcutaneous QHS  . insulin glargine  10 Units Subcutaneous QHS  . loratadine  5 mg Oral QHS  . losartan  25 mg Oral Daily  . memantine  5 mg Oral BID  . metoprolol succinate  50 mg Oral Daily  . pantoprazole  40 mg Oral Daily  . potassium chloride  20 mEq Oral Daily  . pregabalin  25 mg Oral Daily  . sodium chloride flush  10-40 mL Intracatheter Q12H   Continuous Infusions:    LOS: 26 days   Charlynne Cousins  [facility]  11/20/2020, 8:29 AM

## 2020-11-20 NOTE — Plan of Care (Signed)
  Problem: Education: Goal: Knowledge of General Education information will improve Description: Including pain rating scale, medication(s)/side effects and non-pharmacologic comfort measures Outcome: Progressing   Problem: Clinical Measurements: Goal: Ability to maintain clinical measurements within normal limits will improve Outcome: Progressing Goal: Will remain free from infection Outcome: Progressing   

## 2020-11-20 NOTE — Progress Notes (Signed)
glucerna given, pt takes a drink and immediately starts choking and coughing. Oral suction provided to aid pt in clearing airway. Pt able to cough and expectorate some glucerna out of mouth. respiratory rate 17, spo2 97%. No s/sx of distress evident. Provider Stark Klein, NP notified, no new orders received. NP states to continue monitoring throughout night.

## 2020-11-21 DIAGNOSIS — I484 Atypical atrial flutter: Secondary | ICD-10-CM | POA: Diagnosis not present

## 2020-11-21 DIAGNOSIS — M86172 Other acute osteomyelitis, left ankle and foot: Secondary | ICD-10-CM | POA: Diagnosis not present

## 2020-11-21 DIAGNOSIS — E11621 Type 2 diabetes mellitus with foot ulcer: Secondary | ICD-10-CM | POA: Diagnosis not present

## 2020-11-21 DIAGNOSIS — A419 Sepsis, unspecified organism: Secondary | ICD-10-CM | POA: Diagnosis not present

## 2020-11-21 LAB — GLUCOSE, CAPILLARY
Glucose-Capillary: 204 mg/dL — ABNORMAL HIGH (ref 70–99)
Glucose-Capillary: 139 mg/dL — ABNORMAL HIGH (ref 70–99)
Glucose-Capillary: 194 mg/dL — ABNORMAL HIGH (ref 70–99)
Glucose-Capillary: 146 mg/dL — ABNORMAL HIGH (ref 70–99)
Glucose-Capillary: 250 mg/dL — ABNORMAL HIGH (ref 70–99)
Glucose-Capillary: 234 mg/dL — ABNORMAL HIGH (ref 70–99)

## 2020-11-21 LAB — CK: Total CK: 31 U/L — ABNORMAL LOW (ref 49–397)

## 2020-11-21 MED ORDER — MORPHINE SULFATE (PF) 2 MG/ML IV SOLN
2.0000 mg | INTRAVENOUS | Status: DC | PRN
  Administered 2020-11-22: 2 mg via INTRAVENOUS
  Filled 2020-11-21: qty 1

## 2020-11-21 MED ORDER — GUAIFENESIN ER 600 MG PO TB12
600.0000 mg | ORAL_TABLET | Freq: Two times a day (BID) | ORAL | Status: DC
  Administered 2020-11-21 – 2020-11-29 (×16): 600 mg via ORAL
  Filled 2020-11-21 (×16): qty 1

## 2020-11-21 NOTE — Plan of Care (Signed)

## 2020-11-21 NOTE — Progress Notes (Signed)
Attempted to call patient's wife, [name], at home but she stated she cannot talk at this time.  Unable to update her on patient's room transfer.

## 2020-11-21 NOTE — Progress Notes (Signed)
[facility] PROGRESS NOTE    Progress Note  [name]  RCV:[id number] DOB: 02-15-1951 DOA: 10/25/2020 PCP: Leeanne Rio, MD     Brief Narrative:   [name] is an 70 y.o. male past medical history of essential hypertension, diabetes mellitus type 2, a flutter with a history of stroke with left hemiparesis admitted for left wound from a recent trauma [name].  Vascular surgery was consulted recommended to transfer to [facility] done on 10/28/2020, vascular surgery recommended amputation with the patient refused. [name] admission was complicated by acute hypoxic respiratory failure thought to be secondary to acute on chronic systolic heart failure cardiology was consulted and since he initially refused amputation they recommended moving towards hospice care. After family meeting with palliative on 11/04/2020 the patient wife proceeded with amputation.  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 Currently the social worker is working on placement.  Significant Events: Admission to the [name] on 10/25/2020 Transfer to [facility]: 10/28/2018  Significant studies: 06/19/2021 lower extremity Dopplers negative for DVT  Antibiotics: Flagyl daptomycin and Rocephin which were stopped 2-2 2022 he completed 14-day treatment was stopped after amputation.  Microbiology data: 11/12/2020 blood culture: Negative till date  Procedures: 11/11/2020 above-the-knee amputation by vascular surgery  Assessment/Plan:   Sepsis --due to Infected Lt Foot/Ostepmyleitis---POA: He has completed his antibiotic coverage after discussing with ID Dr. De Burrs antibiotics were discontinued after amputation. T-max of 98.5, has remained afebrile Pain management per palliative care.  COVID-19 positivity likely incidental: Not hypoxic, due to his multiple risk factors he was treated empirically with 3  days of IV remdesivir. Will need 10 days of isolation.  Last day day of isolation is 10/13/5850  Acute metabolic encephalopathy: Multifactorial due to postop and polypharmacy. Palliative care to continue to assist with pain management.  Acute blood loss anemia: Likely due to surgery, hemoglobin this morning is 11.2. We will stop checking CBCs.  Chronic combined systolic and diastolic heart failure: With an echo showing an EF of 25%. Cardiology recommended no invasive work-up, continue Toprol losartan and Lasix daily. Patient appears euvolemic.  Persistent atrial flutter ([place]): Continue amiodarone Toprol and Eliquis. Currently rate controlled.  Ischemic cardiomyopathy: Cardiology was consulted recommended Eliquis, Plavix was discontinued no further ischemic work-up. Continue Toprol losartan Lasix.  Acute respiratory failure with hypoxia: Postoperative now resolved.  Diabetes mellitus type 2: With an A1c of 6.9, blood glucose fairly controlled continue current regimen.  History of CVA with left hemiparesis: Continue Eliquis.  Chronic kidney disease stage II: Creatinine at baseline.  Dementia: Continue Aricept, Namenda.  BPH: Continue finasteride.   DVT prophylaxis: Eliquis Family Communication:wife Status is: Inpatient  Remains inpatient appropriate because:Hemodynamically unstable   Dispo: The patient is from: Home              Anticipated d/c is to: SNF              Anticipated d/c date is: 3 days              Patient currently is medically stable to d/c.   Difficult to place patient Yes        Code Status:     Code Status Orders  (From admission, onward)         Start     Ordered   11/09/20 0939  Full code  Continuous        11/09/20  9935        Code Status History    Date Active Date Inactive Code Status Order ID Comments User Context   11/01/2020 1455 11/09/2020 0938 DNR [id number]  Rosezella Rumpf, NP Inpatient   10/31/2020 1439  11/01/2020 1455 Full Code [id number]  Florencia Reasons, MD Inpatient   10/31/2020 1214 10/31/2020 1439 DNR [id number]  Florencia Reasons, MD Inpatient   10/25/2020 2246 10/31/2020 1214 Full Code [id number]  Bernadette Hoit, DO ED   04/08/2015 1151 04/08/2015 2143 Full Code [id number]  Angelia Mould, MD Inpatient   06/30/2014 1428 07/01/2014 1905 Full Code [id number]  Samuella Cota, MD Inpatient   01/12/2013 0804 01/19/2013 1823 Full Code [id number]  Grace Isaac, MD Inpatient   01/09/2013 1430 01/12/2013 0804 Full Code [id number]  Nani Skillern, PA-C Inpatient   Advance Care Planning Activity        IV Access:    Peripheral IV   Procedures and diagnostic studies:   DG Chest Port 1 View  Result Date: 11/19/2020 CLINICAL DATA:  Reported COVID-19 positive EXAM: PORTABLE CHEST 1 VIEW COMPARISON:  November 12, 2020 and October 29, 2020 chest radiographs. FINDINGS: There has been clearing of airspace opacity bilaterally. Subtle interstitial thickening is noted in the bases. No consolidation. Heart is mildly enlarged, stable. Pulmonary vascularity is normal. No adenopathy. Patient is status post coronary artery bypass grafting. Bones are osteoporotic. Prior rib fractures noted on the left. IMPRESSION: Subtle interstitial thickening in the bases. This interstitial thickening may represent residua of COVID 19 pneumonitis. There may also be a degree of underlying bronchitis. No airspace consolidation noted. Stable cardiac prominence. No adenopathy evident. Bones osteoporotic. Electronically Signed   By: Lowella Grip III M.D.   On: 11/19/2020 13:35     Medical Consultants:    None.  Anti-Infectives:   None  Subjective:    [name] no complaints  Objective:    Vitals:   11/20/20 2100 11/21/20 0032 11/21/20 0449 11/21/20 0835  BP: (!) 101/57 113/64 (!) 146/88 118/74  Pulse: 96 74 84 99  Resp: 16 16  18   Temp: 97.8 F (36.6 C) 98.5 F (36.9 C) 98.5 F (36.9 C) 98.1 F (36.7 C)   TempSrc: Oral Oral Oral Oral  SpO2: 94% 100% 100% 97%  Weight:      Height:       SpO2: 97 % O2 Flow Rate (L/min): 1 L/min FiO2 (%): 28 %   Intake/Output Summary (Last 24 hours) at 11/21/2020 1003 Last data filed at 11/21/2020 0450 Gross per 24 hour  Intake 240 ml  Output 1300 ml  Net -1060 ml   Filed Weights   10/25/20 1824 10/26/20 1850 10/27/20 0500  Weight: 116.1 kg 110.5 kg 110.3 kg    Exam: General exam: In no acute distress. Respiratory system: Good air movement and clear to auscultation. Cardiovascular system: S1 & S2 heard, RRR. No JVD. Gastrointestinal system: Abdomen is nondistended, soft and nontender.  Extremities: No pedal edema. Skin: No rashes, lesions or ulcers Psychiatry: Judgement and insight appear normal. Mood & affect appropriate.   Data Reviewed:    Labs: Basic Metabolic Panel: Recent Labs  Lab 11/16/20 0250 11/20/20 0122  NA 137 134*  K 4.0 4.8  CL 104 101  CO2 24 23  GLUCOSE 161* 181*  BUN 9 21  CREATININE 0.69 1.11  CALCIUM 8.0* 8.3*   GFR Estimated Creatinine Clearance: 75.5 mL/min (by C-G formula based on SCr of 1.11 mg/dL). Liver Function Tests:  Recent Labs  Lab 11/20/20 0122  AST 51*  ALT 68*  ALKPHOS 120  BILITOT 1.0  PROT 7.0  ALBUMIN 2.2*   No results for input(s): LIPASE, AMYLASE in the last 168 hours. No results for input(s): AMMONIA in the last 168 hours. Coagulation profile No results for input(s): INR, PROTIME in the last 168 hours. COVID-19 Labs  Recent Labs    11/19/20 1334  DDIMER 0.89*  CRP 1.7*    Lab Results  Component Value Date   SARSCOV2NAA POSITIVE (A) 11/19/2020   SARSCOV2NAA POSITIVE (A) 11/18/2020   [place] NEGATIVE 11/10/2020   [place] NEGATIVE 10/25/2020    CBC: Recent Labs  Lab 11/15/20 0534 11/16/20 0250 11/18/20 0725 11/20/20 0122  WBC 11.5* 9.7 10.6* 10.9*  HGB 8.9* 10.0* 10.7* 11.2*  HCT 27.4* 30.5* 34.4* 35.8*  MCV 97.2 96.8 97.5 99.7  PLT 181 222 269 299    Cardiac Enzymes: Recent Labs  Lab 11/21/20 0441  CKTOTAL 31*   BNP (last 3 results) No results for input(s): PROBNP in the last 8760 hours. CBG: Recent Labs  Lab 11/20/20 0745 11/20/20 1127 11/20/20 1545 11/20/20 2046 11/21/20 0833  GLUCAP 115* 127* 204* 181* 139*   D-Dimer: Recent Labs    11/19/20 1334  DDIMER 0.89*   Hgb A1c: No results for input(s): HGBA1C in the last 72 hours. Lipid Profile: No results for input(s): CHOL, HDL, LDLCALC, TRIG, CHOLHDL, LDLDIRECT in the last 72 hours. Thyroid function studies: No results for input(s): TSH, T4TOTAL, T3FREE, THYROIDAB in the last 72 hours.  Invalid input(s): FREET3 Anemia work up: No results for input(s): VITAMINB12, FOLATE, FERRITIN, TIBC, IRON, RETICCTPCT in the last 72 hours. Sepsis Labs: Recent Labs  Lab 11/15/20 0534 11/16/20 0250 11/18/20 0725 11/20/20 0122  WBC 11.5* 9.7 10.6* 10.9*   Microbiology Recent Results (from the past 240 hour(s))  Culture, blood (routine x 2)     Status: None   Collection Time: 11/12/20  8:31 AM   Specimen: BLOOD RIGHT HAND  Result Value Ref Range Status   Specimen Description BLOOD RIGHT HAND  Final   Special Requests AEROBIC BOTTLE ONLY Blood Culture adequate volume  Final   Culture   Final    NO GROWTH 5 DAYS Performed at [name], [address], [place], [place] [address]    Report Status 11/17/2020 FINAL  Final  Culture, blood (routine x 2)     Status: None   Collection Time: 11/12/20  8:31 AM   Specimen: BLOOD RIGHT HAND  Result Value Ref Range Status   Specimen Description BLOOD RIGHT HAND  Final   Special Requests AEROBIC BOTTLE ONLY Blood Culture adequate volume  Final   Culture   Final    NO GROWTH 5 DAYS Performed at [name], [place] [address] [place], [place] [address]    Report Status 11/17/2020 FINAL  Final  SARS CORONAVIRUS 2 (TAT 6-24 HRS) Nasopharyngeal Nasopharyngeal Swab     Status: Abnormal   Collection Time: 11/18/20  2:46  PM   Specimen: Nasopharyngeal Swab  Result Value Ref Range Status   SARS Coronavirus 2 POSITIVE (A) NEGATIVE Final    Comment: (NOTE) SARS-CoV-2 target nucleic acids are DETECTED.  The SARS-CoV-2 RNA is generally detectable in upper and lower respiratory specimens during the acute phase of infection. Positive results are indicative of the presence of SARS-CoV-2 RNA. Clinical correlation with patient history and other diagnostic information is  necessary to determine patient infection status. Positive results do  not rule out bacterial infection or co-infection with other viruses.  The expected result is Negative.  Fact Sheet for Patients: SugarRoll.be  Fact Sheet for Healthcare Providers: https://www.woods-mathews.com/  This test is not yet approved or cleared by the [place] FDA and  has been authorized for detection and/or diagnosis of SARS-CoV-2 by FDA under an Emergency Use Authorization (EUA). This EUA will remain  in effect (meaning this test can be used) for the duration of the COVID-19 declaration under Section 564(b)(1) of the Act, 21 U. S.C. section 360bbb-3(b)(1), unless the authorization is terminated or revoked sooner.   Performed at [name], [place] [address], [place], [place] [address]   Resp Panel by RT-PCR (Flu A&B, Covid) Nasopharyngeal Swab     Status: Abnormal   Collection Time: 11/19/20  4:11 PM   Specimen: Nasopharyngeal Swab; Nasopharyngeal(NP) swabs in vial transport medium  Result Value Ref Range Status   SARS Coronavirus 2 by RT PCR POSITIVE (A) NEGATIVE Final    Comment: RESULT CALLED TO, READ BACK BY AND VERIFIED WITH: Gordy Clement RN [id number] 11/19/20 A BROWNING (NOTE) SARS-CoV-2 target nucleic acids are DETECTED.  The SARS-CoV-2 RNA is generally detectable in upper respiratory specimens during the acute phase of infection. Positive results are indicative of the presence of the identified virus, but do  not rule out bacterial infection or co-infection with other pathogens not detected by the test. Clinical correlation with patient history and other diagnostic information is necessary to determine patient infection status. The expected result is Negative.  Fact Sheet for Patients: EntrepreneurPulse.com.au  Fact Sheet for Healthcare Providers: IncredibleEmployment.be  This test is not yet approved or cleared by the [place] FDA and  has been authorized for detection and/or diagnosis of SARS-CoV-2 by FDA under an Emergency Use Authorization (EUA).  This EUA will remain in effect (meaning this test can b e used) for the duration of  the COVID-19 declaration under Section 564(b)(1) of the Act, 21 U.S.C. section 360bbb-3(b)(1), unless the authorization is terminated or revoked sooner.     Influenza A by PCR NEGATIVE NEGATIVE Final   Influenza B by PCR NEGATIVE NEGATIVE Final    Comment: (NOTE) The Xpert Xpress SARS-CoV-2/FLU/RSV plus assay is intended as an aid in the diagnosis of influenza from Nasopharyngeal swab specimens and should not be used as a sole basis for treatment. Nasal washings and aspirates are unacceptable for Xpert Xpress SARS-CoV-2/FLU/RSV testing.  Fact Sheet for Patients: EntrepreneurPulse.com.au  Fact Sheet for Healthcare Providers: IncredibleEmployment.be  This test is not yet approved or cleared by the [place] FDA and has been authorized for detection and/or diagnosis of SARS-CoV-2 by FDA under an Emergency Use Authorization (EUA). This EUA will remain in effect (meaning this test can be used) for the duration of the COVID-19 declaration under Section 564(b)(1) of the Act, 21 U.S.C. section 360bbb-3(b)(1), unless the authorization is terminated or revoked.  Performed at [name], [place] [address] [place],  [address]      Medications:   . amiodarone   200 mg Oral Daily  . apixaban  5 mg Oral BID  . atorvastatin  40 mg Oral Daily  . baclofen  5 mg Oral TID  . Chlorhexidine Gluconate Cloth  6 each Topical Daily  . cholecalciferol  5,000 Units Oral Q1200  . donepezil  5 mg Oral QHS  . feeding supplement (GLUCERNA SHAKE)  237 mL Oral TID BM  . ferrous sulfate  325 mg Oral BID WC  .  finasteride  5 mg Oral Q1200  . furosemide  40 mg Oral Daily  . gabapentin  300 mg Oral TID  . Gerhardt's butt cream   Topical QID  . insulin aspart  0-15 Units Subcutaneous TID WC  . insulin aspart  0-5 Units Subcutaneous QHS  . insulin glargine  10 Units Subcutaneous QHS  . loratadine  5 mg Oral QHS  . losartan  25 mg Oral Daily  . memantine  5 mg Oral BID  . metoprolol succinate  50 mg Oral Daily  . pantoprazole  40 mg Oral Daily  . potassium chloride  20 mEq Oral Daily  . pregabalin  25 mg Oral Daily  . sodium chloride flush  10-40 mL Intracatheter Q12H   Continuous Infusions:    LOS: 27 days   Charlynne Cousins  [facility]  11/21/2020, 10:03 AM

## 2020-11-22 DIAGNOSIS — E11621 Type 2 diabetes mellitus with foot ulcer: Secondary | ICD-10-CM | POA: Diagnosis not present

## 2020-11-22 DIAGNOSIS — I484 Atypical atrial flutter: Secondary | ICD-10-CM | POA: Diagnosis not present

## 2020-11-22 DIAGNOSIS — A419 Sepsis, unspecified organism: Secondary | ICD-10-CM | POA: Diagnosis not present

## 2020-11-22 DIAGNOSIS — M86172 Other acute osteomyelitis, left ankle and foot: Secondary | ICD-10-CM | POA: Diagnosis not present

## 2020-11-22 LAB — CBC WITH DIFFERENTIAL/PLATELET
Abs Immature Granulocytes: 0.08 10*3/uL — ABNORMAL HIGH (ref 0.00–0.07)
Basophils Absolute: 0.1 10*3/uL (ref 0.0–0.1)
Basophils Relative: 1 %
Eosinophils Absolute: 0.3 10*3/uL (ref 0.0–0.5)
Eosinophils Relative: 2 %
HCT: 34 % — ABNORMAL LOW (ref 39.0–52.0)
Hemoglobin: 11.2 g/dL — ABNORMAL LOW (ref 13.0–17.0)
Immature Granulocytes: 1 %
Lymphocytes Relative: 28 %
Lymphs Abs: 3.4 10*3/uL (ref 0.7–4.0)
MCH: 31.5 pg (ref 26.0–34.0)
MCHC: 32.9 g/dL (ref 30.0–36.0)
MCV: 95.8 fL (ref 80.0–100.0)
Monocytes Absolute: 1 10*3/uL (ref 0.1–1.0)
Monocytes Relative: 8 %
Neutro Abs: 7.3 10*3/uL (ref 1.7–7.7)
Neutrophils Relative %: 60 %
Platelets: 269 10*3/uL (ref 150–400)
RBC: 3.55 MIL/uL — ABNORMAL LOW (ref 4.22–5.81)
RDW: 17.3 % — ABNORMAL HIGH (ref 11.5–15.5)
WBC: 12.1 10*3/uL — ABNORMAL HIGH (ref 4.0–10.5)
nRBC: 0 % (ref 0.0–0.2)

## 2020-11-22 LAB — GLUCOSE, CAPILLARY
Glucose-Capillary: 131 mg/dL — ABNORMAL HIGH (ref 70–99)
Glucose-Capillary: 123 mg/dL — ABNORMAL HIGH (ref 70–99)
Glucose-Capillary: 141 mg/dL — ABNORMAL HIGH (ref 70–99)
Glucose-Capillary: 152 mg/dL — ABNORMAL HIGH (ref 70–99)

## 2020-11-22 LAB — BASIC METABOLIC PANEL
Anion gap: 9 (ref 5–15)
BUN: 16 mg/dL (ref 8–23)
CO2: 23 mmol/L (ref 22–32)
Calcium: 8.5 mg/dL — ABNORMAL LOW (ref 8.9–10.3)
Chloride: 104 mmol/L (ref 98–111)
Creatinine, Ser: 1.01 mg/dL (ref 0.61–1.24)
GFR, Estimated: >60 mL/min (ref >60)
Glucose, Bld: 116 mg/dL — ABNORMAL HIGH (ref 70–99)
Potassium: 4.6 mmol/L (ref 3.5–5.1)
Sodium: 136 mmol/L (ref 135–145)

## 2020-11-22 MED ORDER — GABAPENTIN 300 MG PO CAPS
300.0000 mg | ORAL_CAPSULE | Freq: Three times a day (TID) | ORAL | Status: DC
  Administered 2020-11-23: 300 mg via ORAL
  Filled 2020-11-22: qty 1

## 2020-11-22 MED ORDER — BACLOFEN 10 MG PO TABS
5.0000 mg | ORAL_TABLET | Freq: Three times a day (TID) | ORAL | Status: DC
  Administered 2020-11-23: 5 mg via ORAL
  Filled 2020-11-22: qty 1

## 2020-11-22 MED ORDER — ADULT MULTIVITAMIN W/MINERALS CH
1.0000 | ORAL_TABLET | Freq: Every day | ORAL | Status: DC
  Administered 2020-11-22 – 2020-11-29 (×8): 1 via ORAL
  Filled 2020-11-22 (×8): qty 1

## 2020-11-22 MED ORDER — ENSURE MAX PROTEIN PO LIQD
11.0000 [oz_av] | Freq: Every day | ORAL | Status: DC
  Administered 2020-11-27 – 2020-11-28 (×2): 11 [oz_av] via ORAL
  Filled 2020-11-22 (×5): qty 330

## 2020-11-22 NOTE — Progress Notes (Addendum)
Physical Therapy Treatment Patient Details Name: [name] MRN: [id number] DOB: [birth year] Today's Date: 11/22/2020    History of Present Illness 70 y.o. male with a PMH of CAD s/p CABG in 2014,  CHF, paroxysmal atrial flutter,  PVD, HTN, HLD, DM type 2, and CVA with left sided hemiparesis, who was initially seen for hypoxic respiratory failure from CHF. Pt also with L foot osteomyelitis and developed sepsis. He is s/p L AKA.    PT Comments    Pt seated in recliner post session after performing AP transfer from bed to recliner.  He continues to require max +2 to move from surface to surface.  ON arrival patient soiled in urine. Continue to recommend snf placement,     Follow Up Recommendations  SNF;Supervision/Assistance - 24 hour     Equipment Recommendations  Other (comment) (hoyer lift and pad)    Recommendations for Other Services       Precautions / Restrictions Precautions Precautions: Fall Restrictions Weight Bearing Restrictions: Yes LLE Weight Bearing: Non weight bearing Other Position/Activity Restrictions: new AKA    Mobility  Bed Mobility Overal bed mobility: Needs Assistance Bed Mobility: Supine to Sit;Sit to Supine;Rolling Rolling: Max assist;+2 for physical assistance   Supine to sit: Max assist;+2 for physical assistance     General bed mobility comments: Pt required assistance to roll to R and L side as he was soiled in urine.  He required change of bed linen pad.  Pt then performed long sitting and moved from bed to recliner.    Transfers Overall transfer level: Needs assistance Equipment used:  (bed pad.) Transfers: Comptroller transfers: Max assist;+2 physical assistance (used R arm to reach back for recliner and push bottom in chair.)   General transfer comment: AP bed to recliner.  Ambulation/Gait                 Stairs             Wheelchair Mobility    Modified Rankin  (Stroke Patients Only)       Balance Overall balance assessment: Needs assistance Sitting-balance support: Single extremity supported;Feet supported Sitting balance-Leahy Scale: Poor       Standing balance-Leahy Scale:  (unable)                              Cognition Arousal/Alertness: Awake/alert Behavior During Therapy: Agitated Overall Cognitive Status: No family/caregiver present to determine baseline cognitive functioning                                 General Comments: Pt unable to stay on task and forgets that he is moving to chair for transfer training with PTA.      Exercises      General Comments        Pertinent Vitals/Pain Pain Assessment: 0-10 Pain Score: 10-Worst pain ever Pain Location: Residual limb, Phantom pain Pain Descriptors / Indicators: Aching Pain Intervention(s): Monitored during session;Repositioned    Home Living                      Prior Function            PT Goals (current goals can now be found in the care plan section) Acute Rehab PT Goals Patient Stated Goal: to be able to move better  Potential to Achieve Goals: Good Progress towards PT goals: Progressing toward goals    Frequency    Min 2X/week      PT Plan Current plan remains appropriate    Co-evaluation              AM-PAC PT "6 Clicks" Mobility   Outcome Measure  Help needed turning from your back to your side while in a flat bed without using bedrails?: A Lot Help needed moving from lying on your back to sitting on the side of a flat bed without using bedrails?: A Lot Help needed moving to and from a bed to a chair (including a wheelchair)?: Total Help needed standing up from a chair using your arms (e.g., wheelchair or bedside chair)?: Total Help needed to walk in hospital room?: Total Help needed climbing 3-5 steps with a railing? : Total 6 Click Score: 8    End of Session Equipment Utilized During Treatment:  Gait belt Activity Tolerance: Patient tolerated treatment well Patient left: in chair;with call bell/phone within reach;with chair alarm set ( sitter belt ) Nurse Communication: Mobility status PT Visit Diagnosis: Unsteadiness on feet (R26.81);Muscle weakness (generalized) (M62.81);Difficulty in walking, not elsewhere classified (R26.2)     Time: 6701-4103 PT Time Calculation (min) (ACUTE ONLY): 31 min  Charges:  $Therapeutic Activity: 23-37 mins                     [name] , PTA Acute Rehabilitation Services Pager [phone] Office [phone]     Aina Rossbach Eli Hose 11/22/2020, 5:13 PM

## 2020-11-22 NOTE — Progress Notes (Signed)
[facility] PROGRESS NOTE    Progress Note  [name]  OFB:[id number] DOB: 08-Feb-1951 DOA: 10/25/2020 PCP: Leeanne Rio, MD     Brief Narrative:   [name] is an 70 y.o. male past medical history of essential hypertension, diabetes mellitus type 2, a flutter with a history of stroke with left hemiparesis admitted for left wound from a recent trauma [facility].  Vascular surgery was consulted recommended to transfer to [facility] done on 10/28/2020, vascular surgery recommended amputation with the patient refused. Hospital admission was complicated by acute hypoxic respiratory failure thought to be secondary to acute on chronic systolic heart failure cardiology was consulted and since he initially refused amputation they recommended moving towards hospice care. After family meeting with palliative on 11/04/2020 the patient wife proceeded with amputation.  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 Currently the social worker is working on placement.  Significant Events: Admission to the hospital on 10/25/2020 Transfer to [facility]: 10/28/2018  Significant studies: 06/19/2021 lower extremity Dopplers negative for DVT  Antibiotics: Flagyl daptomycin and Rocephin which were stopped 2-2 2022 he completed 14-day treatment was stopped after amputation.  Microbiology data: 11/12/2020 blood culture: Negative till date  Procedures: 11/11/2020 above-the-knee amputation by vascular surgery  Assessment/Plan:   Sepsis --due to Infected Lt Foot/Ostepmyleitis---POA: He has completed his antibiotic coverage after discussing with ID Dr. De Burrs antibiotics were discontinued after amputation. T-max of 98.5 has remained afebrile. Continue pain management per palliative care. Little bit sleepy this morning we will hold Neurontin Lyrica and baclofen can resume tomorrow  morning.  COVID-19 positivity likely incidental: Not hypoxic, due to his multiple risk factors he was treated empirically with 3 days of IV remdesivir. Will need 10 days of isolation.  Last day day of isolation is 7/82/4235  Acute metabolic encephalopathy: Multifactorial due to postop and polypharmacy Palliative care to continue to assist with pain management.  Acute blood loss anemia: Likely due to surgery, hemoglobin this morning is 11.2. We will stop checking CBCs.  Chronic combined systolic and diastolic heart failure: With an echo showing an EF of 25%. Cardiology recommended no invasive work-up, continue Toprol losartan and Lasix daily. Patient appears euvolemic.  Persistent atrial flutter ([place]): Continue amiodarone Toprol and Eliquis. Currently rate controlled.  Ischemic cardiomyopathy: Cardiology was consulted recommended Eliquis, Plavix was discontinued no further ischemic work-up. Continue Toprol losartan Lasix.  Acute respiratory failure with hypoxia: Postoperative now resolved.  Diabetes mellitus type 2: With an A1c of 6.9, blood glucose fairly controlled continue current regimen.  History of CVA with left hemiparesis: Continue Eliquis.  Chronic kidney disease stage II: Creatinine at baseline.  Dementia: Continue Aricept, Namenda.  BPH: Continue finasteride.   DVT prophylaxis: Eliquis Family Communication:wife Status is: Inpatient  Remains inpatient appropriate because:Hemodynamically unstable   Dispo: The patient is from: Home              Anticipated d/c is to: SNF              Anticipated d/c date is: 3 days              Patient currently is medically stable to d/c.   Difficult to place patient Yes        Code Status:     Code Status Orders  (From admission, onward)         Start     Ordered  11/09/20 0939  Full code  Continuous        11/09/20 0938        Code Status History    Date Active Date Inactive Code Status Order  ID Comments User Context   11/01/2020 1455 11/09/2020 0938 DNR [id number]  Rosezella Rumpf, NP Inpatient   10/31/2020 1439 11/01/2020 1455 Full Code [id number]  Florencia Reasons, MD Inpatient   10/31/2020 1214 10/31/2020 1439 DNR [id number]  Florencia Reasons, MD Inpatient   10/25/2020 2246 10/31/2020 1214 Full Code [id number]  Bernadette Hoit, DO ED   04/08/2015 1151 04/08/2015 2143 Full Code [id number]  Angelia Mould, MD Inpatient   06/30/2014 1428 07/01/2014 1905 Full Code [id number]  Samuella Cota, MD Inpatient   01/12/2013 0804 01/19/2013 1823 Full Code [id number]  Grace Isaac, MD Inpatient   01/09/2013 1430 01/12/2013 0804 Full Code [id number]  Nani Skillern, PA-C Inpatient   Advance Care Planning Activity        IV Access:    Peripheral IV   Procedures and diagnostic studies:   No results found.   Medical Consultants:    None.  Anti-Infectives:   None  Subjective:    [name] notably is drowsy this morning.  Objective:    Vitals:   11/21/20 1107 11/21/20 1503 11/21/20 1925 11/22/20 0500  BP: 108/89 99/70 (!) 148/128 92/71  Pulse: 77 (!) 53 (!) 103 83  Resp: 16  20 18   Temp: 97.8 F (36.6 C) 98 F (36.7 C) 98.3 F (36.8 C) 98.1 F (36.7 C)  TempSrc: Oral Oral Oral Oral  SpO2: 92% 98% 97% 99%  Weight:      Height:       SpO2: 99 % O2 Flow Rate (L/min): 1 L/min FiO2 (%): 28 %  No intake or output data in the 24 hours ending 11/22/20 0911 Filed Weights   10/25/20 1824 10/26/20 1850 10/27/20 0500  Weight: 116.1 kg 110.5 kg 110.3 kg    Exam: General exam: In no acute distress. Respiratory system: Good air movement and clear to auscultation. Cardiovascular system: S1 & S2 heard, RRR. No JVD. Gastrointestinal system: Abdomen is nondistended, soft and nontender.  Extremities: No pedal edema. Skin: No rashes, lesions or ulcers Psychiatry: Drowsy this morning able to follow commands. Data Reviewed:    Labs: Basic Metabolic Panel: Recent Labs   Lab 11/16/20 0250 11/20/20 0122  NA 137 134*  K 4.0 4.8  CL 104 101  CO2 24 23  GLUCOSE 161* 181*  BUN 9 21  CREATININE 0.69 1.11  CALCIUM 8.0* 8.3*   GFR Estimated Creatinine Clearance: 75.5 mL/min (by C-G formula based on SCr of 1.11 mg/dL). Liver Function Tests: Recent Labs  Lab 11/20/20 0122  AST 51*  ALT 68*  ALKPHOS 120  BILITOT 1.0  PROT 7.0  ALBUMIN 2.2*   No results for input(s): LIPASE, AMYLASE in the last 168 hours. No results for input(s): AMMONIA in the last 168 hours. Coagulation profile No results for input(s): INR, PROTIME in the last 168 hours. COVID-19 Labs  Recent Labs    11/19/20 1334  DDIMER 0.89*  CRP 1.7*    Lab Results  Component Value Date   SARSCOV2NAA POSITIVE (A) 11/19/2020   SARSCOV2NAA POSITIVE (A) 11/18/2020   SARSCOV2NAA NEGATIVE 11/10/2020   [place] NEGATIVE 10/25/2020    CBC: Recent Labs  Lab 11/16/20 0250 11/18/20 0725 11/20/20 0122  WBC 9.7 10.6* 10.9*  HGB 10.0* 10.7* 11.2*  HCT 30.5* 34.4* 35.8*  MCV 96.8 97.5 99.7  PLT 222 269 299   Cardiac Enzymes: Recent Labs  Lab 11/21/20 0441  CKTOTAL 31*   BNP (last 3 results) No results for input(s): PROBNP in the last 8760 hours. CBG: Recent Labs  Lab 11/21/20 1105 11/21/20 1750 11/21/20 2059 11/21/20 2148 11/22/20 0907  GLUCAP 194* 146* 250* 234* 131*   D-Dimer: Recent Labs    11/19/20 1334  DDIMER 0.89*   Hgb A1c: No results for input(s): HGBA1C in the last 72 hours. Lipid Profile: No results for input(s): CHOL, HDL, LDLCALC, TRIG, CHOLHDL, LDLDIRECT in the last 72 hours. Thyroid function studies: No results for input(s): TSH, T4TOTAL, T3FREE, THYROIDAB in the last 72 hours.  Invalid input(s): FREET3 Anemia work up: No results for input(s): VITAMINB12, FOLATE, FERRITIN, TIBC, IRON, RETICCTPCT in the last 72 hours. Sepsis Labs: Recent Labs  Lab 11/16/20 0250 11/18/20 0725 11/20/20 0122  WBC 9.7 10.6* 10.9*   Microbiology Recent  Results (from the past 240 hour(s))  SARS CORONAVIRUS 2 (TAT 6-24 HRS) Nasopharyngeal Nasopharyngeal Swab     Status: Abnormal   Collection Time: 11/18/20  2:46 PM   Specimen: Nasopharyngeal Swab  Result Value Ref Range Status   SARS Coronavirus 2 POSITIVE (A) NEGATIVE Final    Comment: (NOTE) SARS-CoV-2 target nucleic acids are DETECTED.  The SARS-CoV-2 RNA is generally detectable in upper and lower respiratory specimens during the acute phase of infection. Positive results are indicative of the presence of SARS-CoV-2 RNA. Clinical correlation with patient history and other diagnostic information is  necessary to determine patient infection status. Positive results do not rule out bacterial infection or co-infection with other viruses.  The expected result is Negative.  Fact Sheet for Patients: SugarRoll.be  Fact Sheet for Healthcare Providers: https://www.woods-mathews.com/  This test is not yet approved or cleared by the [place] FDA and  has been authorized for detection and/or diagnosis of SARS-CoV-2 by FDA under an Emergency Use Authorization (EUA). This EUA will remain  in effect (meaning this test can be used) for the duration of the COVID-19 declaration under Section 564(b)(1) of the Act, 21 U. S.C. section 360bbb-3(b)(1), unless the authorization is terminated or revoked sooner.   Performed at [facility], [place] [address], [place], [place] [address]   Resp Panel by RT-PCR (Flu A&B, Covid) Nasopharyngeal Swab     Status: Abnormal   Collection Time: 11/19/20  4:11 PM   Specimen: Nasopharyngeal Swab; Nasopharyngeal(NP) swabs in vial transport medium  Result Value Ref Range Status   SARS Coronavirus 2 by RT PCR POSITIVE (A) NEGATIVE Final    Comment: RESULT CALLED TO, READ BACK BY AND VERIFIED WITH: Gordy Clement RN [id number] 11/19/20 A BROWNING (NOTE) SARS-CoV-2 target nucleic acids are DETECTED.  The SARS-CoV-2 RNA is  generally detectable in upper respiratory specimens during the acute phase of infection. Positive results are indicative of the presence of the identified virus, but do not rule out bacterial infection or co-infection with other pathogens not detected by the test. Clinical correlation with patient history and other diagnostic information is necessary to determine patient infection status. The expected result is Negative.  Fact Sheet for Patients: EntrepreneurPulse.com.au  Fact Sheet for Healthcare Providers: IncredibleEmployment.be  This test is not yet approved or cleared by the [place] FDA and  has been authorized for detection and/or diagnosis of SARS-CoV-2 by FDA under an Emergency Use Authorization (EUA).  This EUA will remain in effect (meaning this test can b e used) for the  duration of  the COVID-19 declaration under Section 564(b)(1) of the Act, 21 U.S.C. section 360bbb-3(b)(1), unless the authorization is terminated or revoked sooner.     Influenza A by PCR NEGATIVE NEGATIVE Final   Influenza B by PCR NEGATIVE NEGATIVE Final    Comment: (NOTE) The Xpert Xpress SARS-CoV-2/FLU/RSV plus assay is intended as an aid in the diagnosis of influenza from Nasopharyngeal swab specimens and should not be used as a sole basis for treatment. Nasal washings and aspirates are unacceptable for Xpert Xpress SARS-CoV-2/FLU/RSV testing.  Fact Sheet for Patients: EntrepreneurPulse.com.au  Fact Sheet for Healthcare Providers: IncredibleEmployment.be  This test is not yet approved or cleared by the [place] FDA and has been authorized for detection and/or diagnosis of SARS-CoV-2 by FDA under an Emergency Use Authorization (EUA). This EUA will remain in effect (meaning this test can be used) for the duration of the COVID-19 declaration under Section 564(b)(1) of the Act, 21 U.S.C. section 360bbb-3(b)(1),  unless the authorization is terminated or revoked.  Performed at [facility], [place] [address] [place],  [address]      Medications:   . amiodarone  200 mg Oral Daily  . apixaban  5 mg Oral BID  . atorvastatin  40 mg Oral Daily  . baclofen  5 mg Oral TID  . Chlorhexidine Gluconate Cloth  6 each Topical Daily  . cholecalciferol  5,000 Units Oral Q1200  . donepezil  5 mg Oral QHS  . feeding supplement (GLUCERNA SHAKE)  237 mL Oral TID BM  . ferrous sulfate  325 mg Oral BID WC  . finasteride  5 mg Oral Q1200  . furosemide  40 mg Oral Daily  . gabapentin  300 mg Oral TID  . Gerhardt's butt cream   Topical QID  . guaiFENesin  600 mg Oral BID  . insulin aspart  0-15 Units Subcutaneous TID WC  . insulin aspart  0-5 Units Subcutaneous QHS  . insulin glargine  10 Units Subcutaneous QHS  . loratadine  5 mg Oral QHS  . losartan  25 mg Oral Daily  . memantine  5 mg Oral BID  . metoprolol succinate  50 mg Oral Daily  . pantoprazole  40 mg Oral Daily  . potassium chloride  20 mEq Oral Daily  . pregabalin  25 mg Oral Daily  . sodium chloride flush  10-40 mL Intracatheter Q12H   Continuous Infusions:    LOS: 28 days   Charlynne Cousins  [facility]  11/22/2020, 9:11 AM

## 2020-11-22 NOTE — Plan of Care (Signed)
No acute changes since the previous night that I took care of him. Pain controlled with oxy 10 mg and Tylenol 650 mg PRN. Still waiting for SNF placement, fall precautions in place. Will continue to monitor and continue current POC.

## 2020-11-22 NOTE — Progress Notes (Addendum)
Initial Nutrition Assessment  DOCUMENTATION CODES:   Not applicable  INTERVENTION:   -Downgrade diet to dysphagia 2 (mechanical soft) for ease of intake -MVI with minerals daily -D/c Glucerna Shake po TID, each supplement provides 220 kcal and 10 grams of protein -Ensure Max po daily, each supplement provides 150 kcal and 30 grams of protein -Magic cup TID with meals, each supplement provides 290 kcal and 9 grams of protein  NUTRITION DIAGNOSIS:   Increased nutrient needs related to post-op healing as evidenced by estimated needs.  GOAL:   Patient will meet greater than or equal to 90% of their needs  MONITOR:   PO intake,Supplement acceptance,Labs,Diet advancement,Weight trends,Skin,I & O's  REASON FOR ASSESSMENT:   LOS    ASSESSMENT:   [name] is a 70 y.o. male with medical history significant for hypertension, hyperlipidemia, T2DM, atrial flutter on Eliquis, history of stroke with residual left hemiparesis, CAD who presents to the emergency department via EMS due to worsening left foot pain with ulcerations and foot wounds.  Pt admitted with sepsis secondary to lt foot osteomyelitis.   1/23- s/p BSE- downgraded to dysphagia 2 diet with thin liquids 1/31- s/p lt AKA  Reviewed I/O's: +240 ml x 24 hours and -6.4 L since 11/08/20  Pt unavailable at time of visit.   Pt with good appetite. Noted meal completion 50-100%. Per chart review, pt often with difficulty chewing meats and requires a mechanically altered diet per SLP.   Reviewed wt hx; wt has been stable over the past 7 months.   Per MD notes, pt is medically stable for discharge; awaiting SNF placement.   Medications reviewed and include lasix, ferrous sulfate,  Lab Results  Component Value Date   HGBA1C 6.9 (H) 10/25/2020   PTA DM medications are 40 units insulin glargine BID and 1000 mg metformin BID.   Labs reviewed: CBGS: 123-250 (inpatient orders for glycemic control are 0-15 units insulin  aspart TID with meals, 0-5 units insulin glargine daily, and 10 units insulin glargine daily).   Diet Order:   Diet Order            Diet Carb Modified Fluid consistency: Thin; Room service appropriate? Yes  Diet effective now                 EDUCATION NEEDS:   No education needs have been identified at this time  Skin:  Skin Assessment: Skin Integrity Issues: Skin Integrity Issues:: Incisions Incisions: closed lt leg s/p AKA  Last BM:  11/20/20  Height:   Ht Readings from Last 1 Encounters:  10/26/20 5\' 7"  (1.702 m)    Weight:   Wt Readings from Last 1 Encounters:  10/27/20 110.3 kg    Ideal Body Weight:  61.9 kg (adjusted for lt AKA)  BMI:  Body mass index is 38.09 kg/m.  Estimated Nutritional Needs:   Kcal:  1850-2050  Protein:  110-125 grams  Fluid:  > 1.8 L    Loistine Chance, RD, LDN, [place] Registered Dietitian II Certified Diabetes Care and Education Specialist Please refer to [facility] for RD and/or RD on-call/weekend/after hours pager

## 2020-11-22 NOTE — Plan of Care (Signed)
Patient is s/p left AKA on 1/31. Dsg clean dry and intact with no s/sx of infection noted. Patient refused ice on incision area tonight. Patient is now saline locked and drinking fluids adequately. COVID+; remdesivir 3 day txt completed. Patient is still on room air with no s/sx of SOB or difficulty breathing. NAD or needs voiced. Will continue to monitor and continue current POC.

## 2020-11-23 DIAGNOSIS — A419 Sepsis, unspecified organism: Secondary | ICD-10-CM | POA: Diagnosis not present

## 2020-11-23 DIAGNOSIS — G9341 Metabolic encephalopathy: Secondary | ICD-10-CM

## 2020-11-23 DIAGNOSIS — N400 Enlarged prostate without lower urinary tract symptoms: Secondary | ICD-10-CM | POA: Diagnosis not present

## 2020-11-23 DIAGNOSIS — I10 Essential (primary) hypertension: Secondary | ICD-10-CM | POA: Diagnosis not present

## 2020-11-23 DIAGNOSIS — E11621 Type 2 diabetes mellitus with foot ulcer: Secondary | ICD-10-CM | POA: Diagnosis not present

## 2020-11-23 LAB — GLUCOSE, CAPILLARY
Glucose-Capillary: 145 mg/dL — ABNORMAL HIGH (ref 70–99)
Glucose-Capillary: 238 mg/dL — ABNORMAL HIGH (ref 70–99)
Glucose-Capillary: 234 mg/dL — ABNORMAL HIGH (ref 70–99)
Glucose-Capillary: 304 mg/dL — ABNORMAL HIGH (ref 70–99)

## 2020-11-23 MED ORDER — GABAPENTIN 100 MG PO CAPS
200.0000 mg | ORAL_CAPSULE | Freq: Three times a day (TID) | ORAL | Status: DC
  Administered 2020-11-23 – 2020-11-28 (×15): 200 mg via ORAL
  Filled 2020-11-23 (×15): qty 2

## 2020-11-23 MED ORDER — BACLOFEN 10 MG PO TABS
5.0000 mg | ORAL_TABLET | Freq: Two times a day (BID) | ORAL | Status: DC
  Administered 2020-11-23 – 2020-11-29 (×12): 5 mg via ORAL
  Filled 2020-11-23 (×12): qty 1

## 2020-11-23 NOTE — Plan of Care (Signed)
  Problem: Education: Goal: Knowledge of General Education information will improve Description Including pain rating scale, medication(s)/side effects and non-pharmacologic comfort measures Outcome: Progressing   Problem: Health Behavior/Discharge Planning: Goal: Ability to manage health-related needs will improve Outcome: Progressing   

## 2020-11-23 NOTE — Progress Notes (Signed)
[facility] PROGRESS NOTE    Progress Note  [name]  IBB:[id number] DOB: 1951/01/26 DOA: 10/25/2020 PCP: Leeanne Rio, MD     Brief Narrative:   [name] is an 70 y.o. male past medical history of essential hypertension, diabetes mellitus type 2, a flutter with a history of stroke with left hemiparesis admitted for left wound from a recent trauma [facility].  Vascular surgery was consulted recommended to transfer to [facility] done on 10/28/2020, vascular surgery recommended amputation with the patient refused. Hospital admission was complicated by acute hypoxic respiratory failure thought to be secondary to acute on chronic systolic heart failure cardiology was consulted and since he initially refused amputation they recommended moving towards hospice care. After family meeting with palliative on 11/04/2020 the patient wife proceeded with amputation.  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 Currently the social worker is working on placement.  Significant Events: Admission to the hospital on 10/25/2020 Transfer to [facility]: 10/28/2018  Significant studies: 06/19/2021 lower extremity Dopplers negative for DVT  Antibiotics: Flagyl daptomycin and Rocephin which were stopped 2-2 2022 he completed 14-day treatment was stopped after amputation.  Microbiology data: 11/12/2020 blood culture: Negative till date  Procedures: 11/11/2020 above-the-knee amputation by vascular surgery  Assessment/Plan:   Sepsis --due to Infected Lt Foot/Ostepmyleitis---POA: He has completed his antibiotic coverage after discussing with ID Dr. De Burrs antibiotics were discontinued after amputation. T-max of 98.8 has remained afebrile. Continue narcotics which have been helping.  COVID-19 positivity likely incidental: Not hypoxic, due to his multiple risk factors he was treated empirically  with 3 days of IV remdesivir. Only 10 days of isolation of last day of isolation, 4/50/3888  Acute metabolic encephalopathy: Multifactorial due to postoperative polypharmacy. He had a recurrent episode of  his confusion is likely due to his multiple medications we will go ahead and decrease the dose of some of them.  We will resume them this morning. Palliative care to continue to assist with pain management.  Acute blood loss anemia: Likely due to surgery, hemoglobin this morning is 11.2.  Chronic combined systolic and diastolic heart failure: With an echo showing an EF of 25%. Cardiology recommended no invasive work-up, continue Toprol losartan and Lasix daily. Patient appears euvolemic.  Persistent atrial flutter ([place]): Continue amiodarone Toprol and Eliquis. Currently rate controlled.  Ischemic cardiomyopathy: Cardiology was consulted recommended Eliquis, Plavix was discontinued no further ischemic work-up. Continue Toprol losartan Lasix.  Acute respiratory failure with hypoxia: Postoperative now resolved.  Diabetes mellitus type 2: With an A1c of 6.9, blood glucose fairly controlled continue current regimen.  History of CVA with left hemiparesis: Continue Eliquis.  Chronic kidney disease stage II: Creatinine at baseline.  Dementia: Continue Aricept, Namenda.  BPH: Continue finasteride.   DVT prophylaxis: Eliquis Family Communication:wife Status is: Inpatient  Remains inpatient appropriate because:Hemodynamically unstable   Dispo: The patient is from: Home              Anticipated d/c is to: SNF              Anticipated d/c date is: 3 days              Patient currently is medically stable to d/c.   Difficult to place patient Yes        Code Status:     Code Status Orders  (From admission, onward)  Start     Ordered   11/09/20 0939  Full code  Continuous        11/09/20 0938        Code Status History    Date Active Date Inactive  Code Status Order ID Comments User Context   11/01/2020 1455 11/09/2020 0938 DNR [id number]  Rosezella Rumpf, NP Inpatient   10/31/2020 1439 11/01/2020 1455 Full Code [id number]  Florencia Reasons, MD Inpatient   10/31/2020 1214 10/31/2020 1439 DNR [id number]  Florencia Reasons, MD Inpatient   10/25/2020 2246 10/31/2020 1214 Full Code [id number]  Bernadette Hoit, DO ED   04/08/2015 1151 04/08/2015 2143 Full Code [id number]  Angelia Mould, MD Inpatient   06/30/2014 1428 07/01/2014 1905 Full Code [id number]  Samuella Cota, MD Inpatient   01/12/2013 0804 01/19/2013 1823 Full Code [id number]  Grace Isaac, MD Inpatient   01/09/2013 1430 01/12/2013 0804 Full Code [id number]  Nani Skillern, PA-C Inpatient   Advance Care Planning Activity        IV Access:    Peripheral IV   Procedures and diagnostic studies:   No results found.   Medical Consultants:    None.  Anti-Infectives:   None  Subjective:    [name] more awake this morning.  Objective:    Vitals:   11/22/20 1416 11/22/20 1959 11/23/20 0359 11/23/20 0810  BP: 101/87 101/87 131/66 125/78  Pulse: 98 76 (!) 49 (!) 109  Resp:  18 16 18   Temp: 97.9 F (36.6 C) 98.1 F (36.7 C) 98 F (36.7 C) (!) 97.5 F (36.4 C)  TempSrc: Oral Oral Oral Axillary  SpO2: 93% 94% 100% 100%  Weight:      Height:       SpO2: 100 % O2 Flow Rate (L/min): 1 L/min FiO2 (%): 28 %   Intake/Output Summary (Last 24 hours) at 11/23/2020 0935 Last data filed at 11/22/2020 1417 Gross per 24 hour  Intake 120 ml  Output -  Net 120 ml   Filed Weights   10/25/20 1824 10/26/20 1850 10/27/20 0500  Weight: 116.1 kg 110.5 kg 110.3 kg    Exam: General exam: In no acute distress. Respiratory system: Good air movement and clear to auscultation. Cardiovascular system: S1 & S2 heard, RRR. No JVD. Gastrointestinal system: Abdomen is nondistended, soft and nontender.  Extremities: No pedal edema. Skin: No rashes, lesions or ulcers Psychiatry:  Judgement and insight appear normal. Mood & affect appropriate.  Mentation has improved this morning. Data Reviewed:    Labs: Basic Metabolic Panel: Recent Labs  Lab 11/20/20 0122 11/22/20 0943  NA 134* 136  K 4.8 4.6  CL 101 104  CO2 23 23  GLUCOSE 181* 116*  BUN 21 16  CREATININE 1.11 1.01  CALCIUM 8.3* 8.5*   GFR Estimated Creatinine Clearance: 83 mL/min (by C-G formula based on SCr of 1.01 mg/dL). Liver Function Tests: Recent Labs  Lab 11/20/20 0122  AST 51*  ALT 68*  ALKPHOS 120  BILITOT 1.0  PROT 7.0  ALBUMIN 2.2*   No results for input(s): LIPASE, AMYLASE in the last 168 hours. No results for input(s): AMMONIA in the last 168 hours. Coagulation profile No results for input(s): INR, PROTIME in the last 168 hours. COVID-19 Labs  No results for input(s): DDIMER, FERRITIN, LDH, CRP in the last 72 hours.  Lab Results  Component Value Date   SARSCOV2NAA POSITIVE (A) 11/19/2020   SARSCOV2NAA POSITIVE (A) 11/18/2020   [place] NEGATIVE 11/10/2020  [place] NEGATIVE 10/25/2020    CBC: Recent Labs  Lab 11/18/20 0725 11/20/20 0122 11/22/20 0943  WBC 10.6* 10.9* 12.1*  NEUTROABS  --   --  7.3  HGB 10.7* 11.2* 11.2*  HCT 34.4* 35.8* 34.0*  MCV 97.5 99.7 95.8  PLT 269 299 269   Cardiac Enzymes: Recent Labs  Lab 11/21/20 0441  CKTOTAL 31*   BNP (last 3 results) No results for input(s): PROBNP in the last 8760 hours. CBG: Recent Labs  Lab 11/22/20 0907 11/22/20 1154 11/22/20 1709 11/22/20 2106 11/23/20 0650  GLUCAP 131* 123* 141* 152* 145*   D-Dimer: No results for input(s): DDIMER in the last 72 hours. Hgb A1c: No results for input(s): HGBA1C in the last 72 hours. Lipid Profile: No results for input(s): CHOL, HDL, LDLCALC, TRIG, CHOLHDL, LDLDIRECT in the last 72 hours. Thyroid function studies: No results for input(s): TSH, T4TOTAL, T3FREE, THYROIDAB in the last 72 hours.  Invalid input(s): FREET3 Anemia work up: No results for  input(s): VITAMINB12, FOLATE, FERRITIN, TIBC, IRON, RETICCTPCT in the last 72 hours. Sepsis Labs: Recent Labs  Lab 11/18/20 0725 11/20/20 0122 11/22/20 0943  WBC 10.6* 10.9* 12.1*   Microbiology Recent Results (from the past 240 hour(s))  SARS CORONAVIRUS 2 (TAT 6-24 HRS) Nasopharyngeal Nasopharyngeal Swab     Status: Abnormal   Collection Time: 11/18/20  2:46 PM   Specimen: Nasopharyngeal Swab  Result Value Ref Range Status   SARS Coronavirus 2 POSITIVE (A) NEGATIVE Final    Comment: (NOTE) SARS-CoV-2 target nucleic acids are DETECTED.  The SARS-CoV-2 RNA is generally detectable in upper and lower respiratory specimens during the acute phase of infection. Positive results are indicative of the presence of SARS-CoV-2 RNA. Clinical correlation with patient history and other diagnostic information is  necessary to determine patient infection status. Positive results do not rule out bacterial infection or co-infection with other viruses.  The expected result is Negative.  Fact Sheet for Patients: SugarRoll.be  Fact Sheet for Healthcare Providers: https://www.woods-mathews.com/  This test is not yet approved or cleared by the [place] FDA and  has been authorized for detection and/or diagnosis of SARS-CoV-2 by FDA under an Emergency Use Authorization (EUA). This EUA will remain  in effect (meaning this test can be used) for the duration of the COVID-19 declaration under Section 564(b)(1) of the Act, 21 U. S.C. section 360bbb-3(b)(1), unless the authorization is terminated or revoked sooner.   Performed at [facility], [place] [address], [place], [place] [address]   Resp Panel by RT-PCR (Flu A&B, Covid) Nasopharyngeal Swab     Status: Abnormal   Collection Time: 11/19/20  4:11 PM   Specimen: Nasopharyngeal Swab; Nasopharyngeal(NP) swabs in vial transport medium  Result Value Ref Range Status   SARS Coronavirus 2 by RT PCR  POSITIVE (A) NEGATIVE Final    Comment: RESULT CALLED TO, READ BACK BY AND VERIFIED WITH: Gordy Clement RN [id number] 11/19/20 A BROWNING (NOTE) SARS-CoV-2 target nucleic acids are DETECTED.  The SARS-CoV-2 RNA is generally detectable in upper respiratory specimens during the acute phase of infection. Positive results are indicative of the presence of the identified virus, but do not rule out bacterial infection or co-infection with other pathogens not detected by the test. Clinical correlation with patient history and other diagnostic information is necessary to determine patient infection status. The expected result is Negative.  Fact Sheet for Patients: EntrepreneurPulse.com.au  Fact Sheet for Healthcare Providers: IncredibleEmployment.be  This test is not yet approved or cleared  by the [place] and  has been authorized for detection and/or diagnosis of SARS-CoV-2 by FDA under an Emergency Use Authorization (EUA).  This EUA will remain in effect (meaning this test can b e used) for the duration of  the COVID-19 declaration under Section 564(b)(1) of the Act, 21 U.S.C. section 360bbb-3(b)(1), unless the authorization is terminated or revoked sooner.     Influenza A by PCR NEGATIVE NEGATIVE Final   Influenza B by PCR NEGATIVE NEGATIVE Final    Comment: (NOTE) The Xpert Xpress SARS-CoV-2/FLU/RSV plus assay is intended as an aid in the diagnosis of influenza from Nasopharyngeal swab specimens and should not be used as a sole basis for treatment. Nasal washings and aspirates are unacceptable for Xpert Xpress SARS-CoV-2/FLU/RSV testing.  Fact Sheet for Patients: EntrepreneurPulse.com.au  Fact Sheet for Healthcare Providers: IncredibleEmployment.be  This test is not yet approved or cleared by the [place] FDA and has been authorized for detection and/or diagnosis of SARS-CoV-2 by FDA under an Emergency  Use Authorization (EUA). This EUA will remain in effect (meaning this test can be used) for the duration of the COVID-19 declaration under Section 564(b)(1) of the Act, 21 U.S.C. section 360bbb-3(b)(1), unless the authorization is terminated or revoked.  Performed at [facility], [place] [address], [place], [place] [address]      Medications:   . amiodarone  200 mg Oral Daily  . apixaban  5 mg Oral BID  . atorvastatin  40 mg Oral Daily  . baclofen  5 mg Oral TID  . Chlorhexidine Gluconate Cloth  6 each Topical Daily  . cholecalciferol  5,000 Units Oral Q1200  . donepezil  5 mg Oral QHS  . ferrous sulfate  325 mg Oral BID WC  . finasteride  5 mg Oral Q1200  . furosemide  40 mg Oral Daily  . gabapentin  300 mg Oral TID  . Gerhardt's butt cream   Topical QID  . guaiFENesin  600 mg Oral BID  . insulin aspart  0-15 Units Subcutaneous TID WC  . insulin aspart  0-5 Units Subcutaneous QHS  . insulin glargine  10 Units Subcutaneous QHS  . loratadine  5 mg Oral QHS  . losartan  25 mg Oral Daily  . memantine  5 mg Oral BID  . metoprolol succinate  50 mg Oral Daily  . multivitamin with minerals  1 tablet Oral Daily  . pantoprazole  40 mg Oral Daily  . potassium chloride  20 mEq Oral Daily  . pregabalin  25 mg Oral Daily  . Ensure Max Protein  11 oz Oral QHS  . sodium chloride flush  10-40 mL Intracatheter Q12H   Continuous Infusions:    LOS: 29 days   Charlynne Cousins  [facility]  11/23/2020, 9:35 AM

## 2020-11-23 NOTE — Plan of Care (Signed)
No acute changes since the previous night that I took care of him. Will continue to monitor and continue current POC. 

## 2020-11-24 DIAGNOSIS — A419 Sepsis, unspecified organism: Secondary | ICD-10-CM | POA: Diagnosis not present

## 2020-11-24 DIAGNOSIS — I484 Atypical atrial flutter: Secondary | ICD-10-CM | POA: Diagnosis not present

## 2020-11-24 LAB — GLUCOSE, CAPILLARY
Glucose-Capillary: 150 mg/dL — ABNORMAL HIGH (ref 70–99)
Glucose-Capillary: 240 mg/dL — ABNORMAL HIGH (ref 70–99)
Glucose-Capillary: 166 mg/dL — ABNORMAL HIGH (ref 70–99)
Glucose-Capillary: 237 mg/dL — ABNORMAL HIGH (ref 70–99)

## 2020-11-24 NOTE — Plan of Care (Signed)
  Problem: Nutrition: Goal: Adequate nutrition will be maintained Outcome: Progressing   Problem: Coping: Goal: Level of anxiety will decrease Outcome: Progressing   

## 2020-11-24 NOTE — Progress Notes (Signed)
[facility] PROGRESS NOTE    Progress Note  MUSA REWERTS  TIW:[id number] DOB: [birth year] DOA: 10/25/2020 PCP: Leeanne Rio, MD     Brief Narrative:   [name] is an 70 y.o. male past medical history of essential hypertension, diabetes mellitus type 2, a flutter with a history of stroke with left hemiparesis admitted for left wound from a recent trauma [facility].  Vascular surgery was consulted recommended to transfer to [facility] done on 10/28/2020, vascular surgery recommended amputation with the patient refused. Hospital admission was complicated by acute hypoxic respiratory failure thought to be secondary to acute on chronic systolic heart failure cardiology was consulted and since he initially refused amputation they recommended moving towards hospice care. After family meeting with palliative on 11/04/2020 the patient wife proceeded with amputation.  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 Currently the social worker is working on placement.  Significant Events: Admission to the hospital on 10/25/2020 Transfer to [facility]: 10/28/2018  Significant studies: 06/19/2021 lower extremity Dopplers negative for DVT  Antibiotics: Flagyl daptomycin and Rocephin which were stopped 2-2 2022 he completed 14-day treatment was stopped after amputation.  Microbiology data: 11/12/2020 blood culture: Negative till date  Procedures: 11/11/2020 above-the-knee amputation by vascular surgery  Assessment/Plan:   Sepsis --due to Infected Lt Foot/Ostepmyleitis---POA: He has completed his antibiotic coverage after discussing with ID Dr. De Burrs antibiotics were discontinued after amputation. T-max of 98.8 has remained afebrile. Continue narcotics which have been helping.  COVID-19 positivity likely incidental: Not hypoxic, due to his multiple risk factors he was treated empirically  with 3 days of IV remdesivir. Only 10 days of isolation of last day of isolation, 2/50/5397  Acute metabolic encephalopathy: Multifactorial due to postoperative polypharmacy. He had a recurrent episode of  his confusion is likely due to his multiple medications we will go ahead and decrease the dose of some of them.  We will resume them this morning. Palliative care to continue to assist with pain management.  Acute blood loss anemia: Likely due to surgery, hemoglobin this morning is 11.2.  Chronic combined systolic and diastolic heart failure: With an echo showing an EF of 25%. Cardiology recommended no invasive work-up, continue Toprol losartan and Lasix daily. Patient appears euvolemic.  Persistent atrial flutter ([place]): Continue amiodarone Toprol and Eliquis. Currently rate controlled.  Ischemic cardiomyopathy: Cardiology was consulted recommended Eliquis, Plavix was discontinued no further ischemic work-up. Continue Toprol losartan Lasix.  Acute respiratory failure with hypoxia: Postoperative now resolved.  Diabetes mellitus type 2: With an A1c of 6.9, blood glucose fairly controlled continue current regimen.  History of CVA with left hemiparesis: Continue Eliquis.  Chronic kidney disease stage II: Creatinine at baseline.  Dementia: Continue Aricept, Namenda.  BPH: Continue finasteride.   DVT prophylaxis: Eliquis Family Communication:wife Status is: Inpatient  Remains inpatient appropriate because:Hemodynamically unstable   Dispo: The patient is from: Home              Anticipated d/c is to: SNF              Anticipated d/c date is: 0 days              Patient currently is medically stable to d/c.   Difficult to place patient Yes        Code Status:     Code Status Orders  (From admission, onward)  Start     Ordered   11/09/20 0939  Full code  Continuous        11/09/20 0938        Code Status History    Date Active Date Inactive  Code Status Order ID Comments User Context   11/01/2020 1455 11/09/2020 0938 DNR [id number]  Rosezella Rumpf, NP Inpatient   10/31/2020 1439 11/01/2020 1455 Full Code [id number]  Florencia Reasons, MD Inpatient   10/31/2020 1214 10/31/2020 1439 DNR [id number]  Florencia Reasons, MD Inpatient   10/25/2020 2246 10/31/2020 1214 Full Code [id number]  Bernadette Hoit, DO ED   04/08/2015 1151 04/08/2015 2143 Full Code [id number]  Angelia Mould, MD Inpatient   06/30/2014 1428 07/01/2014 1905 Full Code [id number]  Samuella Cota, MD Inpatient   01/12/2013 0804 01/19/2013 1823 Full Code [id number]  Grace Isaac, MD Inpatient   01/09/2013 1430 01/12/2013 0804 Full Code [id number]  Nani Skillern, PA-C Inpatient   Advance Care Planning Activity        IV Access:    Peripheral IV   Procedures and diagnostic studies:   No results found.   Medical Consultants:    None.  Anti-Infectives:   None  Subjective:    KINSLER SOEDER more awake this morning.  Objective:    Vitals:   11/23/20 1358 11/23/20 1952 11/24/20 0444 11/24/20 0857  BP: 104/62 100/61 104/63 112/71  Pulse: (!) 108 100 79 90  Resp: 17 17 16    Temp: 97.7 F (36.5 C) 98.3 F (36.8 C) 98.7 F (37.1 C)   TempSrc: Oral Oral Oral   SpO2: 96% 95% 100% 100%  Weight:      Height:       SpO2: 100 % O2 Flow Rate (L/min): 1 L/min FiO2 (%): 28 %   Intake/Output Summary (Last 24 hours) at 11/24/2020 0940 Last data filed at 11/23/2020 1001 Gross per 24 hour  Intake 240 ml  Output --  Net 240 ml   Filed Weights   10/25/20 1824 10/26/20 1850 10/27/20 0500  Weight: 116.1 kg 110.5 kg 110.3 kg    Exam: General exam: In no acute distress. Respiratory system: Good air movement and clear to auscultation. Cardiovascular system: S1 & S2 heard, RRR. No JVD. Gastrointestinal system: Abdomen is nondistended, soft and nontender.  Extremities: No pedal edema. Skin: No rashes, lesions or ulcers Psychiatry: Judgement and insight appear  normal. Mood & affect appropriate.  Mentation has improved this morning. Data Reviewed:    Labs: Basic Metabolic Panel: Recent Labs  Lab 11/20/20 0122 11/22/20 0943  NA 134* 136  K 4.8 4.6  CL 101 104  CO2 23 23  GLUCOSE 181* 116*  BUN 21 16  CREATININE 1.11 1.01  CALCIUM 8.3* 8.5*   GFR Estimated Creatinine Clearance: 83 mL/min (by C-G formula based on SCr of 1.01 mg/dL). Liver Function Tests: Recent Labs  Lab 11/20/20 0122  AST 51*  ALT 68*  ALKPHOS 120  BILITOT 1.0  PROT 7.0  ALBUMIN 2.2*   No results for input(s): LIPASE, AMYLASE in the last 168 hours. No results for input(s): AMMONIA in the last 168 hours. Coagulation profile No results for input(s): INR, PROTIME in the last 168 hours. COVID-19 Labs  No results for input(s): DDIMER, FERRITIN, LDH, CRP in the last 72 hours.  Lab Results  Component Value Date   SARSCOV2NAA POSITIVE (A) 11/19/2020   SARSCOV2NAA POSITIVE (A) 11/18/2020   [place] NEGATIVE 11/10/2020   [place] NEGATIVE 10/25/2020  CBC: Recent Labs  Lab 11/18/20 0725 11/20/20 0122 11/22/20 0943  WBC 10.6* 10.9* 12.1*  NEUTROABS  --   --  7.3  HGB 10.7* 11.2* 11.2*  HCT 34.4* 35.8* 34.0*  MCV 97.5 99.7 95.8  PLT 269 299 269   Cardiac Enzymes: Recent Labs  Lab 11/21/20 0441  CKTOTAL 31*   BNP (last 3 results) No results for input(s): PROBNP in the last 8760 hours. CBG: Recent Labs  Lab 11/23/20 0650 11/23/20 1141 11/23/20 1643 11/23/20 2040 11/24/20 0645  GLUCAP 145* 238* 234* 304* 150*   D-Dimer: No results for input(s): DDIMER in the last 72 hours. Hgb A1c: No results for input(s): HGBA1C in the last 72 hours. Lipid Profile: No results for input(s): CHOL, HDL, LDLCALC, TRIG, CHOLHDL, LDLDIRECT in the last 72 hours. Thyroid function studies: No results for input(s): TSH, T4TOTAL, T3FREE, THYROIDAB in the last 72 hours.  Invalid input(s): FREET3 Anemia work up: No results for input(s): VITAMINB12, FOLATE,  FERRITIN, TIBC, IRON, RETICCTPCT in the last 72 hours. Sepsis Labs: Recent Labs  Lab 11/18/20 0725 11/20/20 0122 11/22/20 0943  WBC 10.6* 10.9* 12.1*   Microbiology Recent Results (from the past 240 hour(s))  SARS CORONAVIRUS 2 (TAT 6-24 HRS) Nasopharyngeal Nasopharyngeal Swab     Status: Abnormal   Collection Time: 11/18/20  2:46 PM   Specimen: Nasopharyngeal Swab  Result Value Ref Range Status   SARS Coronavirus 2 POSITIVE (A) NEGATIVE Final    Comment: (NOTE) SARS-CoV-2 target nucleic acids are DETECTED.  The SARS-CoV-2 RNA is generally detectable in upper and lower respiratory specimens during the acute phase of infection. Positive results are indicative of the presence of SARS-CoV-2 RNA. Clinical correlation with patient history and other diagnostic information is  necessary to determine patient infection status. Positive results do not rule out bacterial infection or co-infection with other viruses.  The expected result is Negative.  Fact Sheet for Patients: SugarRoll.be  Fact Sheet for Healthcare Providers: https://www.woods-mathews.com/  This test is not yet approved or cleared by the [place] FDA and  has been authorized for detection and/or diagnosis of SARS-CoV-2 by FDA under an Emergency Use Authorization (EUA). This EUA will remain  in effect (meaning this test can be used) for the duration of the COVID-19 declaration under Section 564(b)(1) of the Act, 21 U. S.C. section 360bbb-3(b)(1), unless the authorization is terminated or revoked sooner.   Performed at [facility], [place] [address], [place],  [address]   Resp Panel by RT-PCR (Flu A&B, Covid) Nasopharyngeal Swab     Status: Abnormal   Collection Time: 11/19/20  4:11 PM   Specimen: Nasopharyngeal Swab; Nasopharyngeal(NP) swabs in vial transport medium  Result Value Ref Range Status   SARS Coronavirus 2 by RT PCR POSITIVE (A) NEGATIVE Final     Comment: RESULT CALLED TO, READ BACK BY AND VERIFIED WITH: Gordy Clement RN [id number] 11/19/20 A BROWNING (NOTE) SARS-CoV-2 target nucleic acids are DETECTED.  The SARS-CoV-2 RNA is generally detectable in upper respiratory specimens during the acute phase of infection. Positive results are indicative of the presence of the identified virus, but do not rule out bacterial infection or co-infection with other pathogens not detected by the test. Clinical correlation with patient history and other diagnostic information is necessary to determine patient infection status. The expected result is Negative.  Fact Sheet for Patients: EntrepreneurPulse.com.au  Fact Sheet for Healthcare Providers: IncredibleEmployment.be  This test is not yet approved or cleared by the [place] and  has been authorized for detection and/or diagnosis of SARS-CoV-2 by FDA under an Emergency Use Authorization (EUA).  This EUA will remain in effect (meaning this test can b e used) for the duration of  the COVID-19 declaration under Section 564(b)(1) of the Act, 21 U.S.C. section 360bbb-3(b)(1), unless the authorization is terminated or revoked sooner.     Influenza A by PCR NEGATIVE NEGATIVE Final   Influenza B by PCR NEGATIVE NEGATIVE Final    Comment: (NOTE) The Xpert Xpress SARS-CoV-2/FLU/RSV plus assay is intended as an aid in the diagnosis of influenza from Nasopharyngeal swab specimens and should not be used as a sole basis for treatment. Nasal washings and aspirates are unacceptable for Xpert Xpress SARS-CoV-2/FLU/RSV testing.  Fact Sheet for Patients: EntrepreneurPulse.com.au  Fact Sheet for Healthcare Providers: IncredibleEmployment.be  This test is not yet approved or cleared by the [place] FDA and has been authorized for detection and/or diagnosis of SARS-CoV-2 by FDA under an Emergency Use Authorization (EUA). This EUA  will remain in effect (meaning this test can be used) for the duration of the COVID-19 declaration under Section 564(b)(1) of the Act, 21 U.S.C. section 360bbb-3(b)(1), unless the authorization is terminated or revoked.  Performed at [facility], [place] [address] [place], [place] [address]      Medications:   . amiodarone  200 mg Oral Daily  . apixaban  5 mg Oral BID  . atorvastatin  40 mg Oral Daily  . baclofen  5 mg Oral BID  . Chlorhexidine Gluconate Cloth  6 each Topical Daily  . cholecalciferol  5,000 Units Oral Q1200  . donepezil  5 mg Oral QHS  . ferrous sulfate  325 mg Oral BID WC  . finasteride  5 mg Oral Q1200  . furosemide  40 mg Oral Daily  . gabapentin  200 mg Oral TID  . Gerhardt's butt cream   Topical QID  . guaiFENesin  600 mg Oral BID  . insulin aspart  0-15 Units Subcutaneous TID WC  . insulin aspart  0-5 Units Subcutaneous QHS  . insulin glargine  10 Units Subcutaneous QHS  . loratadine  5 mg Oral QHS  . losartan  25 mg Oral Daily  . memantine  5 mg Oral BID  . metoprolol succinate  50 mg Oral Daily  . multivitamin with minerals  1 tablet Oral Daily  . pantoprazole  40 mg Oral Daily  . potassium chloride  20 mEq Oral Daily  . pregabalin  25 mg Oral Daily  . Ensure Max Protein  11 oz Oral QHS  . sodium chloride flush  10-40 mL Intracatheter Q12H   Continuous Infusions:    LOS: 30 days   Charlynne Cousins  [facility]  11/24/2020, 9:40 AM

## 2020-11-24 NOTE — Plan of Care (Signed)
No acute changes since the previous night that I took care of him. Will continue to monitor and continue current POC. 

## 2020-11-25 DIAGNOSIS — A419 Sepsis, unspecified organism: Secondary | ICD-10-CM | POA: Diagnosis not present

## 2020-11-25 DIAGNOSIS — I484 Atypical atrial flutter: Secondary | ICD-10-CM | POA: Diagnosis not present

## 2020-11-25 LAB — GLUCOSE, CAPILLARY
Glucose-Capillary: 148 mg/dL — ABNORMAL HIGH (ref 70–99)
Glucose-Capillary: 264 mg/dL — ABNORMAL HIGH (ref 70–99)
Glucose-Capillary: 276 mg/dL — ABNORMAL HIGH (ref 70–99)
Glucose-Capillary: 48 mg/dL — ABNORMAL LOW (ref 70–99)
Glucose-Capillary: 146 mg/dL — ABNORMAL HIGH (ref 70–99)
Glucose-Capillary: 199 mg/dL — ABNORMAL HIGH (ref 70–99)
Glucose-Capillary: 173 mg/dL — ABNORMAL HIGH (ref 70–99)
Glucose-Capillary: 182 mg/dL — ABNORMAL HIGH (ref 70–99)

## 2020-11-25 LAB — BASIC METABOLIC PANEL
Anion gap: 10 (ref 5–15)
BUN: 18 mg/dL (ref 8–23)
CO2: 24 mmol/L (ref 22–32)
Calcium: 8.4 mg/dL — ABNORMAL LOW (ref 8.9–10.3)
Chloride: 98 mmol/L (ref 98–111)
Creatinine, Ser: 0.97 mg/dL (ref 0.61–1.24)
GFR, Estimated: >60 mL/min (ref >60)
Glucose, Bld: 177 mg/dL — ABNORMAL HIGH (ref 70–99)
Potassium: 4.3 mmol/L (ref 3.5–5.1)
Sodium: 132 mmol/L — ABNORMAL LOW (ref 135–145)

## 2020-11-25 MED ORDER — DEXTROSE 50 % IV SOLN
INTRAVENOUS | Status: AC
  Filled 2020-11-25: qty 50

## 2020-11-25 MED ORDER — LACTATED RINGERS IV BOLUS: 1000.0000 mL | Freq: Once | INTRAVENOUS | Status: DC

## 2020-11-25 MED ORDER — LACTATED RINGERS IV BOLUS
500.0000 mL | Freq: Once | INTRAVENOUS | Status: AC
  Administered 2020-11-25: 500 mL via INTRAVENOUS

## 2020-11-25 MED ORDER — DEXTROSE 50 % IV SOLN
25.0000 g | INTRAVENOUS | Status: AC
  Administered 2020-11-25: 25 g via INTRAVENOUS

## 2020-11-25 MED ORDER — POLYETHYLENE GLYCOL 3350 17 G PO PACK
17.0000 g | PACK | Freq: Two times a day (BID) | ORAL | Status: AC
  Administered 2020-11-26: 17 g via ORAL
  Filled 2020-11-25: qty 1

## 2020-11-25 MED ORDER — SODIUM CHLORIDE 0.9 % IV BOLUS: 500.0000 mL | Freq: Once | INTRAVENOUS | Status: DC | PRN

## 2020-11-25 NOTE — Significant Event (Signed)
Rapid Response Event Note   Reason for Call :  Hypotension after getting oob to the chair with PT BP 59/42  Similar BP in bilat arms Pt is drowsy  Initial Focused Assessment:  Upon my arrival patient is lying flat in the bed   BP 73/59  HR 87  RR 14  O2 sat 100% on RA  Temp 98.8  He is a bit drowsy and slow to interact.  After a few minutes and his BP being 80s-90s he is awake and joking with staff.   Lung sounds decreased bases  75cc urine in external catheter collection bag last emptied about 0500 (450cc)  Bladder scan 90-100cc  Interventions:  Placed 20 ga NSL Right Forearm 1L LR bolus  BP 87/64,  95/66      Plan of Care:   Recheck BP an hour after bolus complete Call if patient hypotensive  Event Summary:   MD Notified: Charlynne Cousins Call Time: [phone] Arrival Time: 1625 End Time: 1800  Raliegh Ip, RN

## 2020-11-25 NOTE — Progress Notes (Signed)
Patient moved from bed to chair with PT at bedside, pt sleepy at this time.  Blood pressure 50s/40s, drowsy to voice.  Capillary blood glucose 48, replaced per protocol.  RRT called.

## 2020-11-25 NOTE — Progress Notes (Signed)
Dr. Aileen Fass aware of blood pressure 87/55 with a Map of 65, verbalized ok to keep map >65.

## 2020-11-25 NOTE — Progress Notes (Signed)
[facility] PROGRESS NOTE    Progress Note  [name]  LPF:[id number] DOB: 04/27/1951 DOA: 10/25/2020 PCP: Leeanne Rio, MD     Brief Narrative:   [name] is an 70 y.o. male past medical history of essential hypertension, diabetes mellitus type 2, a flutter with a history of stroke with left hemiparesis admitted for left wound from a recent trauma [facility].  Vascular surgery was consulted recommended to transfer to [facility] done on 10/28/2020, vascular surgery recommended amputation with the patient refused. Hospital admission was complicated by acute hypoxic respiratory failure thought to be secondary to acute on chronic systolic heart failure cardiology was consulted and since he initially refused amputation they recommended moving towards hospice care. After family meeting with palliative on 11/04/2020 the patient wife proceeded with amputation.  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 Currently the social worker is working on placement, skilled nursing facility will take him on 11/29/2020 once he is isolation is completed.  Significant Events: Admission to the hospital on 10/25/2020 Transfer to [facility]: 10/28/2018  Significant studies: 06/19/2021 lower extremity Dopplers negative for DVT  Antibiotics: Flagyl daptomycin and Rocephin which were stopped 2-2 2022 he completed 14-day treatment was stopped after amputation.  Microbiology data: 11/12/2020 blood culture: Negative till date  Procedures: 11/11/2020 above-the-knee amputation by vascular surgery  Assessment/Plan:   Sepsis --due to Infected Lt Foot/Ostepmyleitis---POA: He has completed his antibiotic coverage after discussing with ID Dr. De Burrs antibiotics were discontinued after amputation. T-max of 98.8 has remained afebrile. Continue narcotics which have been helping.  COVID-19 positivity likely  incidental: Not hypoxic, due to his multiple risk factors he was treated empirically with 3 days of IV remdesivir. Only 10 days of isolation of last day of isolation, 5/32/9924  Acute metabolic encephalopathy: Multifactorial due to postoperative polypharmacy. He had a recurrent episode of  his confusion is likely due to his multiple medications we will go ahead and decrease the dose of some of them.  We will resume them this morning. Palliative care to continue to assist with pain management.  Acute blood loss anemia: Likely due to surgery, hemoglobin this morning is 11.2.  Chronic combined systolic and diastolic heart failure: With an echo showing an EF of 25%. Cardiology recommended no invasive work-up, continue Toprol losartan and Lasix daily. Patient appears euvolemic.  Persistent atrial flutter ([place]): Continue amiodarone Toprol and Eliquis. Currently rate controlled.  Ischemic cardiomyopathy: Cardiology was consulted recommended Eliquis, Plavix was discontinued no further ischemic work-up. Continue Toprol losartan Lasix.  Acute respiratory failure with hypoxia: Postoperative now resolved.  Diabetes mellitus type 2: With an A1c of 6.9, blood glucose fairly controlled continue current regimen.  History of CVA with left hemiparesis: Continue Eliquis.  Chronic kidney disease stage II: Creatinine at baseline.  Dementia: Continue Aricept, Namenda.  BPH: Continue finasteride.   DVT prophylaxis: Eliquis Family Communication:wife Status is: Inpatient  Remains inpatient appropriate because:Hemodynamically unstable   Dispo: The patient is from: Home              Anticipated d/c is to: SNF              Anticipated d/c date is: 0 days              Patient currently is medically stable to d/c.   Difficult to place patient Yes        Code Status:  Code Status Orders  (From admission, onward)         Start     Ordered   11/09/20 0939  Full code   Continuous        11/09/20 0938        Code Status History    Date Active Date Inactive Code Status Order ID Comments User Context   11/01/2020 1455 11/09/2020 0938 DNR [id number]  Rosezella Rumpf, NP Inpatient   10/31/2020 1439 11/01/2020 1455 Full Code [id number]  Florencia Reasons, MD Inpatient   10/31/2020 1214 10/31/2020 1439 DNR [id number]  Florencia Reasons, MD Inpatient   10/25/2020 2246 10/31/2020 1214 Full Code [id number]  Bernadette Hoit, DO ED   04/08/2015 1151 04/08/2015 2143 Full Code [id number]  Angelia Mould, MD Inpatient   06/30/2014 1428 07/01/2014 1905 Full Code [id number]  Samuella Cota, MD Inpatient   01/12/2013 0804 01/19/2013 1823 Full Code [id number]  Grace Isaac, MD Inpatient   01/09/2013 1430 01/12/2013 0804 Full Code [id number]  Nani Skillern, PA-C Inpatient   Advance Care Planning Activity        IV Access:    Peripheral IV   Procedures and diagnostic studies:   No results found.   Medical Consultants:    None.  Anti-Infectives:   None  Subjective:    [name] has no new complaints  Objective:    Vitals:   11/24/20 1947 11/24/20 2036 11/25/20 0441 11/25/20 0906  BP: 92/60 103/73 114/88 100/62  Pulse: (!) 101 99 100 (!) 56  Resp: 17   19  Temp: 98.6 F (37 C) 97.8 F (36.6 C) 98 F (36.7 C) 98.2 F (36.8 C)  TempSrc: Oral Oral Oral Oral  SpO2: 100% 98% 100% 98%  Weight:      Height:       SpO2: 98 % O2 Flow Rate (L/min): 1 L/min FiO2 (%): 28 %   Intake/Output Summary (Last 24 hours) at 11/25/2020 1024 Last data filed at 11/24/2020 1800 Gross per 24 hour  Intake 480 ml  Output --  Net 480 ml   Filed Weights   10/25/20 1824 10/26/20 1850 10/27/20 0500  Weight: 116.1 kg 110.5 kg 110.3 kg    Exam: General exam: In no acute distress. Respiratory system: Good air movement and clear to auscultation. Cardiovascular system: S1 & S2 heard, RRR. No JVD. Gastrointestinal system: Abdomen is nondistended, soft and nontender.   Extremities: No pedal edema. Skin: No rashes, lesions or ulcers Psychiatry: Judgement and insight appear normal. Mood & affect appropriate.  Mentation has improved this morning. Data Reviewed:    Labs: Basic Metabolic Panel: Recent Labs  Lab 11/20/20 0122 11/22/20 0943  NA 134* 136  K 4.8 4.6  CL 101 104  CO2 23 23  GLUCOSE 181* 116*  BUN 21 16  CREATININE 1.11 1.01  CALCIUM 8.3* 8.5*   GFR Estimated Creatinine Clearance: 83 mL/min (by C-G formula based on SCr of 1.01 mg/dL). Liver Function Tests: Recent Labs  Lab 11/20/20 0122  AST 51*  ALT 68*  ALKPHOS 120  BILITOT 1.0  PROT 7.0  ALBUMIN 2.2*   No results for input(s): LIPASE, AMYLASE in the last 168 hours. No results for input(s): AMMONIA in the last 168 hours. Coagulation profile No results for input(s): INR, PROTIME in the last 168 hours. COVID-19 Labs  No results for input(s): DDIMER, FERRITIN, LDH, CRP in the last 72 hours.  Lab Results  Component Value Date   SARSCOV2NAA POSITIVE (A)  11/19/2020   SARSCOV2NAA POSITIVE (A) 11/18/2020   SARSCOV2NAA NEGATIVE 11/10/2020   [place] NEGATIVE 10/25/2020    CBC: Recent Labs  Lab 11/20/20 0122 11/22/20 0943  WBC 10.9* 12.1*  NEUTROABS  --  7.3  HGB 11.2* 11.2*  HCT 35.8* 34.0*  MCV 99.7 95.8  PLT 299 269   Cardiac Enzymes: Recent Labs  Lab 11/21/20 0441  CKTOTAL 31*   BNP (last 3 results) No results for input(s): PROBNP in the last 8760 hours. CBG: Recent Labs  Lab 11/24/20 0645 11/24/20 1126 11/24/20 1659 11/24/20 1949 11/25/20 0702  GLUCAP 150* 240* 166* 237* 148*   D-Dimer: No results for input(s): DDIMER in the last 72 hours. Hgb A1c: No results for input(s): HGBA1C in the last 72 hours. Lipid Profile: No results for input(s): CHOL, HDL, LDLCALC, TRIG, CHOLHDL, LDLDIRECT in the last 72 hours. Thyroid function studies: No results for input(s): TSH, T4TOTAL, T3FREE, THYROIDAB in the last 72 hours.  Invalid input(s):  FREET3 Anemia work up: No results for input(s): VITAMINB12, FOLATE, FERRITIN, TIBC, IRON, RETICCTPCT in the last 72 hours. Sepsis Labs: Recent Labs  Lab 11/20/20 0122 11/22/20 0943  WBC 10.9* 12.1*   Microbiology Recent Results (from the past 240 hour(s))  SARS CORONAVIRUS 2 (TAT 6-24 HRS) Nasopharyngeal Nasopharyngeal Swab     Status: Abnormal   Collection Time: 11/18/20  2:46 PM   Specimen: Nasopharyngeal Swab  Result Value Ref Range Status   SARS Coronavirus 2 POSITIVE (A) NEGATIVE Final    Comment: (NOTE) SARS-CoV-2 target nucleic acids are DETECTED.  The SARS-CoV-2 RNA is generally detectable in upper and lower respiratory specimens during the acute phase of infection. Positive results are indicative of the presence of SARS-CoV-2 RNA. Clinical correlation with patient history and other diagnostic information is  necessary to determine patient infection status. Positive results do not rule out bacterial infection or co-infection with other viruses.  The expected result is Negative.  Fact Sheet for Patients: SugarRoll.be  Fact Sheet for Healthcare Providers: https://www.woods-mathews.com/  This test is not yet approved or cleared by the [place] FDA and  has been authorized for detection and/or diagnosis of SARS-CoV-2 by FDA under an Emergency Use Authorization (EUA). This EUA will remain  in effect (meaning this test can be used) for the duration of the COVID-19 declaration under Section 564(b)(1) of the Act, 21 U. S.C. section 360bbb-3(b)(1), unless the authorization is terminated or revoked sooner.   Performed at [facility], [place] [address] [place], [place] [address]   Resp Panel by RT-PCR (Flu A&B, Covid) Nasopharyngeal Swab     Status: Abnormal   Collection Time: 11/19/20  4:11 PM   Specimen: Nasopharyngeal Swab; Nasopharyngeal(NP) swabs in vial transport medium  Result Value Ref Range Status   SARS  Coronavirus 2 by RT PCR POSITIVE (A) NEGATIVE Final    Comment: RESULT CALLED TO, READ BACK BY AND VERIFIED WITH: Gordy Clement RN [id number] 11/19/20 A BROWNING (NOTE) SARS-CoV-2 target nucleic acids are DETECTED.  The SARS-CoV-2 RNA is generally detectable in upper respiratory specimens during the acute phase of infection. Positive results are indicative of the presence of the identified virus, but do not rule out bacterial infection or co-infection with other pathogens not detected by the test. Clinical correlation with patient history and other diagnostic information is necessary to determine patient infection status. The expected result is Negative.  Fact Sheet for Patients: EntrepreneurPulse.com.au  Fact Sheet for Healthcare Providers: IncredibleEmployment.be  This test is not yet approved or  cleared by the [place] and  has been authorized for detection and/or diagnosis of SARS-CoV-2 by FDA under an Emergency Use Authorization (EUA).  This EUA will remain in effect (meaning this test can b e used) for the duration of  the COVID-19 declaration under Section 564(b)(1) of the Act, 21 U.S.C. section 360bbb-3(b)(1), unless the authorization is terminated or revoked sooner.     Influenza A by PCR NEGATIVE NEGATIVE Final   Influenza B by PCR NEGATIVE NEGATIVE Final    Comment: (NOTE) The Xpert Xpress SARS-CoV-2/FLU/RSV plus assay is intended as an aid in the diagnosis of influenza from Nasopharyngeal swab specimens and should not be used as a sole basis for treatment. Nasal washings and aspirates are unacceptable for Xpert Xpress SARS-CoV-2/FLU/RSV testing.  Fact Sheet for Patients: EntrepreneurPulse.com.au  Fact Sheet for Healthcare Providers: IncredibleEmployment.be  This test is not yet approved or cleared by the [place] FDA and has been authorized for detection and/or diagnosis of SARS-CoV-2  by FDA under an Emergency Use Authorization (EUA). This EUA will remain in effect (meaning this test can be used) for the duration of the COVID-19 declaration under Section 564(b)(1) of the Act, 21 U.S.C. section 360bbb-3(b)(1), unless the authorization is terminated or revoked.  Performed at [facility], [place] [address], [place], [place] [address]      Medications:   . amiodarone  200 mg Oral Daily  . apixaban  5 mg Oral BID  . atorvastatin  40 mg Oral Daily  . baclofen  5 mg Oral BID  . Chlorhexidine Gluconate Cloth  6 each Topical Daily  . cholecalciferol  5,000 Units Oral Q1200  . donepezil  5 mg Oral QHS  . ferrous sulfate  325 mg Oral BID WC  . finasteride  5 mg Oral Q1200  . furosemide  40 mg Oral Daily  . gabapentin  200 mg Oral TID  . Gerhardt's butt cream   Topical QID  . guaiFENesin  600 mg Oral BID  . insulin aspart  0-15 Units Subcutaneous TID WC  . insulin aspart  0-5 Units Subcutaneous QHS  . insulin glargine  10 Units Subcutaneous QHS  . loratadine  5 mg Oral QHS  . losartan  25 mg Oral Daily  . memantine  5 mg Oral BID  . metoprolol succinate  50 mg Oral Daily  . multivitamin with minerals  1 tablet Oral Daily  . pantoprazole  40 mg Oral Daily  . potassium chloride  20 mEq Oral Daily  . pregabalin  25 mg Oral Daily  . Ensure Max Protein  11 oz Oral QHS  . sodium chloride flush  10-40 mL Intracatheter Q12H   Continuous Infusions:    LOS: 31 days   Charlynne Cousins  [facility]  11/25/2020, 10:24 AM

## 2020-11-25 NOTE — Progress Notes (Addendum)
Dr. Aileen Fass Aware of patient's decreased blood pressure and low blood glucose.  Orders received for fluid bolus, will continue to monitor closely.

## 2020-11-25 NOTE — Progress Notes (Signed)
Physical Therapy Treatment Patient Details Name: [name] MRN: [id number] DOB: [birth year] Today's Date: 11/25/2020    History of Present Illness 70 y.o. male with a PMH of CAD s/p CABG in 2014,  CHF, paroxysmal atrial flutter,  PVD, HTN, HLD, DM type 2, and CVA with left sided hemiparesis, who was initially seen for hypoxic respiratory failure from CHF. Pt also with L foot osteomyelitis and developed sepsis. He is s/p L AKA.    PT Comments    Pt supine in bed on arrival.  Focused on LE strengthening and OOB to recliner.  Pt more lethargic this session and intermittently falling asleep during session.  Seated in recliner post session with chair alarm on.    Addendum: Of note:  RN check BP post session and patient with soft BP and low blood glucose return @ 1616 to assist with back to bed with hoyer lift. Plan to check BP pre transfer next tx.      Follow Up Recommendations  SNF;Supervision/Assistance - 24 hour     Equipment Recommendations  Other (comment) (hoyer lift and pad)    Recommendations for Other Services       Precautions / Restrictions Precautions Precautions: Fall Precaution Comments: check BP pre transfer Restrictions Weight Bearing Restrictions: Yes LLE Weight Bearing: Non weight bearing Other Position/Activity Restrictions: new AKA    Mobility  Bed Mobility Overal bed mobility: Needs Assistance Bed Mobility: Rolling Rolling: +2 for physical assistance;Mod assist Sidelying to sit: Max assist Supine to sit: Max assist;+2 for physical assistance     General bed mobility comments: Pt required assistance to roll to R and L side to place pad for transfer OOB to recliner.  Pt then performed long sitting and moved from bed to recliner.  Heavy posterior lean.    Transfers Overall transfer level: Needs assistance Equipment used:  (bed pad) Transfers: Comptroller transfers: Max assist;Total assist   General  transfer comment: Pt required assistance to move back ward into recliner more lethargic today and require total assistance.  Ambulation/Gait Ambulation/Gait assistance:  (NT unable)               Stairs             Wheelchair Mobility    Modified Rankin (Stroke Patients Only)       Balance Overall balance assessment: Needs assistance Sitting-balance support: Single extremity supported;Feet supported Sitting balance-Leahy Scale: Poor Sitting balance - Comments: Initially requiring mod A to maintain sitting balance, but progressed to min guard with use of RUE for support                                    Cognition Arousal/Alertness: Lethargic;Suspect due to medications Behavior During Therapy: Agitated (intermittently agitated when roused.  Pt falling asleep during session.) Overall Cognitive Status: No family/caregiver present to determine baseline cognitive functioning                                 General Comments: Pt unable to stay on task and forgets that he is moving to chair for transfer training with PTA.  Very lethargic this session and asking for pain medicine.      Exercises General Exercises - Lower Extremity Ankle Circles/Pumps: AROM;Right;10 reps;Supine Quad Sets: AROM;Right;10 reps;Supine Heel Slides: AAROM;Right;10 reps;Supine Hip ABduction/ADduction: AAROM;Both;10  reps;Supine Straight Leg Raises: AAROM;Both;10 reps;Supine    General Comments        Pertinent Vitals/Pain Pain Assessment: Faces Faces Pain Scale: Hurts a little bit Pain Location: Residual limb, Phantom pain Pain Descriptors / Indicators: Aching Pain Intervention(s): Monitored during session;Repositioned    Home Living                      Prior Function            PT Goals (current goals can now be found in the care plan section) Acute Rehab PT Goals Patient Stated Goal: to be able to move better Potential to Achieve Goals:  Good Progress towards PT goals: Progressing toward goals    Frequency    Min 2X/week      PT Plan Current plan remains appropriate    Co-evaluation              AM-PAC PT "6 Clicks" Mobility   Outcome Measure  Help needed turning from your back to your side while in a flat bed without using bedrails?: A Lot Help needed moving from lying on your back to sitting on the side of a flat bed without using bedrails?: A Lot Help needed moving to and from a bed to a chair (including a wheelchair)?: Total Help needed standing up from a chair using your arms (e.g., wheelchair or bedside chair)?: Total Help needed to walk in hospital room?: Total Help needed climbing 3-5 steps with a railing? : Total 6 Click Score: 8    End of Session Equipment Utilized During Treatment: Gait belt Activity Tolerance: Patient tolerated treatment well Patient left: in bed;with call bell/phone within reach;with bed alarm set Nurse Communication: Mobility status PT Visit Diagnosis: Unsteadiness on feet (R26.81);Muscle weakness (generalized) (M62.81);Difficulty in walking, not elsewhere classified (R26.2)     Time: 3435-6861 PT Time Calculation (min) (ACUTE ONLY): 35 min  Charges:  $Therapeutic Exercise: 8-22 mins $Therapeutic Activity: 8-22 mins                     [name] , PTA Acute Rehabilitation Services Pager [phone] Office [phone]     [name] [name] 11/25/2020, 6:25 PM

## 2020-11-26 ENCOUNTER — Inpatient Hospital Stay (HOSPITAL_COMMUNITY): Payer: Medicare Other

## 2020-11-26 DIAGNOSIS — M86172 Other acute osteomyelitis, left ankle and foot: Secondary | ICD-10-CM | POA: Diagnosis not present

## 2020-11-26 DIAGNOSIS — E11621 Type 2 diabetes mellitus with foot ulcer: Secondary | ICD-10-CM | POA: Diagnosis not present

## 2020-11-26 DIAGNOSIS — A419 Sepsis, unspecified organism: Secondary | ICD-10-CM | POA: Diagnosis not present

## 2020-11-26 DIAGNOSIS — I484 Atypical atrial flutter: Secondary | ICD-10-CM | POA: Diagnosis not present

## 2020-11-26 LAB — BASIC METABOLIC PANEL
Anion gap: 9 (ref 5–15)
BUN: 16 mg/dL (ref 8–23)
CO2: 24 mmol/L (ref 22–32)
Calcium: 8.4 mg/dL — ABNORMAL LOW (ref 8.9–10.3)
Chloride: 102 mmol/L (ref 98–111)
Creatinine, Ser: 0.94 mg/dL (ref 0.61–1.24)
GFR, Estimated: >60 mL/min (ref >60)
Glucose, Bld: 159 mg/dL — ABNORMAL HIGH (ref 70–99)
Potassium: 4.5 mmol/L (ref 3.5–5.1)
Sodium: 135 mmol/L (ref 135–145)

## 2020-11-26 LAB — GLUCOSE, CAPILLARY
Glucose-Capillary: 157 mg/dL — ABNORMAL HIGH (ref 70–99)
Glucose-Capillary: 145 mg/dL — ABNORMAL HIGH (ref 70–99)
Glucose-Capillary: 155 mg/dL — ABNORMAL HIGH (ref 70–99)
Glucose-Capillary: 200 mg/dL — ABNORMAL HIGH (ref 70–99)

## 2020-11-26 MED ORDER — LACTATED RINGERS IV BOLUS: 500.0000 mL | Freq: Once | INTRAVENOUS | Status: DC

## 2020-11-26 MED ORDER — LACTATED RINGERS IV BOLUS
250.0000 mL | Freq: Once | INTRAVENOUS | Status: AC
  Administered 2020-11-26: 250 mL via INTRAVENOUS

## 2020-11-26 NOTE — Progress Notes (Signed)
[facility] PROGRESS NOTE    Progress Note  [name]  JIR:[id number] DOB: 1951/04/13 DOA: 10/25/2020 PCP: Leeanne Rio, MD     Brief Narrative:   [name] is an 70 y.o. male past medical history of essential hypertension, diabetes mellitus type 2, a flutter with a history of stroke with left hemiparesis admitted for left wound from a recent trauma [facility].  Vascular surgery was consulted recommended to transfer to [facility] done on 10/28/2020, vascular surgery recommended amputation with the patient refused. Hospital admission was complicated by acute hypoxic respiratory failure thought to be secondary to acute on chronic systolic heart failure cardiology was consulted and since he initially refused amputation they recommended moving towards hospice care. After family meeting with palliative on 11/04/2020 the patient wife proceeded with amputation.  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 Currently the social worker is working on placement, skilled nursing facility will take him on 11/29/2020 once he is isolation is completed.  Significant Events: Admission to the hospital on 10/25/2020 Transfer to [facility]: 10/28/2018  Significant studies: 06/19/2021 lower extremity Dopplers negative for DVT  Antibiotics: Flagyl daptomycin and Rocephin which were stopped 2-2 2022 he completed 14-day treatment was stopped after amputation.  Microbiology data: 11/12/2020 blood culture: Negative till date  Procedures: 11/11/2020 above-the-knee amputation by vascular surgery  Assessment/Plan:   Sepsis --due to Infected Lt Foot/Ostepmyleitis---POA: He has completed his antibiotic coverage after discussing with ID Dr. De Burrs antibiotics were discontinued after amputation. T-max of 98.8 has remained afebrile. Continue narcotics which have been helping.  COVID-19 positivity likely  incidental: Not hypoxic, due to his multiple risk factors he was treated empirically with 3 days of IV remdesivir. Only 10 days of isolation of last day of isolation, 7/51/0258  Acute metabolic encephalopathy: Multifactorial due to polypharmacy. His medications have been resumed at a lower dose.  Acute blood loss anemia: Likely due to surgery, hemoglobin this morning is 11.2.  Chronic combined systolic and diastolic heart failure: With an echo showing an EF of 25%. Cardiology recommended no invasive work-up, continue Toprol losartan and Lasix daily. Patient appears euvolemic.  Persistent atrial flutter ([place]): Continue amiodarone Toprol and Eliquis. Currently rate controlled.  Ischemic cardiomyopathy: Cardiology was consulted recommended Eliquis, Plavix was discontinued no further ischemic work-up. Continue Toprol losartan Lasix.  Acute respiratory failure with hypoxia: Postoperative now resolved.  Diabetes mellitus type 2: With an A1c of 6.9, blood glucose fairly controlled continue current regimen.  History of CVA with left hemiparesis: Continue Eliquis.  Chronic kidney disease stage II: Creatinine at baseline.  Dementia: Continue Aricept, Namenda.  BPH: Continue finasteride.  Mild episode of hypotension: He was given a bolus of normal saline his creatinine improved, his Lasix and losartan was held, tachycardia is improving. We will probably have to go home on lower dose of Lasix and off losartan,, losartan can be started as an outpatient in 1 to 2 weeks.  Continue metoprolol, for his ischemic cardiomyopathy.   DVT prophylaxis: Eliquis Family Communication:wife Status is: Inpatient  Remains inpatient appropriate because:Hemodynamically unstable   Dispo: The patient is from: Home              Anticipated d/c is to: SNF              Anticipated d/c date is: 0 days              Patient  currently is medically stable to d/c.   Difficult to place patient  Yes        Code Status:     Code Status Orders  (From admission, onward)         Start     Ordered   11/09/20 0939  Full code  Continuous        11/09/20 0938        Code Status History    Date Active Date Inactive Code Status Order ID Comments User Context   11/01/2020 1455 11/09/2020 0938 DNR [id number]  Rosezella Rumpf, NP Inpatient   10/31/2020 1439 11/01/2020 1455 Full Code [id number]  Florencia Reasons, MD Inpatient   10/31/2020 1214 10/31/2020 1439 DNR [id number]  Florencia Reasons, MD Inpatient   10/25/2020 2246 10/31/2020 1214 Full Code [id number]  Bernadette Hoit, DO ED   04/08/2015 1151 04/08/2015 2143 Full Code [id number]  Angelia Mould, MD Inpatient   06/30/2014 1428 07/01/2014 1905 Full Code [id number]  Samuella Cota, MD Inpatient   01/12/2013 0804 01/19/2013 1823 Full Code [id number]  Grace Isaac, MD Inpatient   01/09/2013 1430 01/12/2013 0804 Full Code [id number]  Nani Skillern, PA-C Inpatient   Advance Care Planning Activity        IV Access:    Peripheral IV   Procedures and diagnostic studies:   No results found.   Medical Consultants:    None.  Anti-Infectives:   None  Subjective:    [name] relates pain all over.  Objective:    Vitals:   11/25/20 2010 11/26/20 0000 11/26/20 0415 11/26/20 0740  BP: 96/63 101/73 103/62 (!) 106/92  Pulse: 97 100 98 98  Resp: 15 18 16 16   Temp: 97.8 F (36.6 C) 98 F (36.7 C) 98.1 F (36.7 C) 98.1 F (36.7 C)  TempSrc: Oral Oral Oral Oral  SpO2: 93% 99% 94% 100%  Weight:      Height:       SpO2: 100 % O2 Flow Rate (L/min): 1 L/min FiO2 (%): 28 %   Intake/Output Summary (Last 24 hours) at 11/26/2020 1014 Last data filed at 11/26/2020 0700 Gross per 24 hour  Intake 360 ml  Output 575 ml  Net -215 ml   Filed Weights   10/25/20 1824 10/26/20 1850 10/27/20 0500  Weight: 116.1 kg 110.5 kg 110.3 kg    Exam: General exam: In no acute distress. Respiratory system: Good air movement  and clear to auscultation. Cardiovascular system: S1 & S2 heard, RRR. No JVD. Gastrointestinal system: Abdomen is nondistended, soft and nontender.  Extremities: No pedal edema. Skin: No rashes, lesions or ulcers Psychiatry: Judgement and insight appear normal. Mood & affect appropriate. Data Reviewed:    Labs: Basic Metabolic Panel: Recent Labs  Lab 11/20/20 0122 11/22/20 0943 11/25/20 1900 11/26/20 0425  NA 134* 136 132* 135  K 4.8 4.6 4.3 4.5  CL 101 104 98 102  CO2 23 23 24 24   GLUCOSE 181* 116* 177* 159*  BUN 21 16 18 16   CREATININE 1.11 1.01 0.97 0.94  CALCIUM 8.3* 8.5* 8.4* 8.4*   GFR Estimated Creatinine Clearance: 89.1 mL/min (by C-G formula based on SCr of 0.94 mg/dL). Liver Function Tests: Recent Labs  Lab 11/20/20 0122  AST 51*  ALT 68*  ALKPHOS 120  BILITOT 1.0  PROT 7.0  ALBUMIN 2.2*   No results for input(s): LIPASE, AMYLASE in the last 168 hours. No results for input(s): AMMONIA in the last 168 hours. Coagulation  profile No results for input(s): INR, PROTIME in the last 168 hours. COVID-19 Labs  No results for input(s): DDIMER, FERRITIN, LDH, CRP in the last 72 hours.  Lab Results  Component Value Date   SARSCOV2NAA POSITIVE (A) 11/19/2020   SARSCOV2NAA POSITIVE (A) 11/18/2020   SARSCOV2NAA NEGATIVE 11/10/2020   [place] NEGATIVE 10/25/2020    CBC: Recent Labs  Lab 11/20/20 0122 11/22/20 0943  WBC 10.9* 12.1*  NEUTROABS  --  7.3  HGB 11.2* 11.2*  HCT 35.8* 34.0*  MCV 99.7 95.8  PLT 299 269   Cardiac Enzymes: Recent Labs  Lab 11/21/20 0441  CKTOTAL 31*   BNP (last 3 results) No results for input(s): PROBNP in the last 8760 hours. CBG: Recent Labs  Lab 11/25/20 1643 11/25/20 1712 11/25/20 1847 11/25/20 2038 11/26/20 0747  GLUCAP 199* 146* 173* 182* 157*   D-Dimer: No results for input(s): DDIMER in the last 72 hours. Hgb A1c: No results for input(s): HGBA1C in the last 72 hours. Lipid Profile: No results for  input(s): CHOL, HDL, LDLCALC, TRIG, CHOLHDL, LDLDIRECT in the last 72 hours. Thyroid function studies: No results for input(s): TSH, T4TOTAL, T3FREE, THYROIDAB in the last 72 hours.  Invalid input(s): FREET3 Anemia work up: No results for input(s): VITAMINB12, FOLATE, FERRITIN, TIBC, IRON, RETICCTPCT in the last 72 hours. Sepsis Labs: Recent Labs  Lab 11/20/20 0122 11/22/20 0943  WBC 10.9* 12.1*   Microbiology Recent Results (from the past 240 hour(s))  SARS CORONAVIRUS 2 (TAT 6-24 HRS) Nasopharyngeal Nasopharyngeal Swab     Status: Abnormal   Collection Time: 11/18/20  2:46 PM   Specimen: Nasopharyngeal Swab  Result Value Ref Range Status   SARS Coronavirus 2 POSITIVE (A) NEGATIVE Final    Comment: (NOTE) SARS-CoV-2 target nucleic acids are DETECTED.  The SARS-CoV-2 RNA is generally detectable in upper and lower respiratory specimens during the acute phase of infection. Positive results are indicative of the presence of SARS-CoV-2 RNA. Clinical correlation with patient history and other diagnostic information is  necessary to determine patient infection status. Positive results do not rule out bacterial infection or co-infection with other viruses.  The expected result is Negative.  Fact Sheet for Patients: SugarRoll.be  Fact Sheet for Healthcare Providers: https://www.woods-mathews.com/  This test is not yet approved or cleared by the [place] FDA and  has been authorized for detection and/or diagnosis of SARS-CoV-2 by FDA under an Emergency Use Authorization (EUA). This EUA will remain  in effect (meaning this test can be used) for the duration of the COVID-19 declaration under Section 564(b)(1) of the Act, 21 U. S.C. section 360bbb-3(b)(1), unless the authorization is terminated or revoked sooner.   Performed at [facility], [place] [address], [place], [place] [address]   Resp Panel by RT-PCR (Flu A&B, Covid)  Nasopharyngeal Swab     Status: Abnormal   Collection Time: 11/19/20  4:11 PM   Specimen: Nasopharyngeal Swab; Nasopharyngeal(NP) swabs in vial transport medium  Result Value Ref Range Status   SARS Coronavirus 2 by RT PCR POSITIVE (A) NEGATIVE Final    Comment: RESULT CALLED TO, READ BACK BY AND VERIFIED WITH: Gordy Clement RN [id number] 11/19/20 A BROWNING (NOTE) SARS-CoV-2 target nucleic acids are DETECTED.  The SARS-CoV-2 RNA is generally detectable in upper respiratory specimens during the acute phase of infection. Positive results are indicative of the presence of the identified virus, but do not rule out bacterial infection or co-infection with other pathogens not detected by the test. Clinical correlation  with patient history and other diagnostic information is necessary to determine patient infection status. The expected result is Negative.  Fact Sheet for Patients: EntrepreneurPulse.com.au  Fact Sheet for Healthcare Providers: IncredibleEmployment.be  This test is not yet approved or cleared by the [place] FDA and  has been authorized for detection and/or diagnosis of SARS-CoV-2 by FDA under an Emergency Use Authorization (EUA).  This EUA will remain in effect (meaning this test can b e used) for the duration of  the COVID-19 declaration under Section 564(b)(1) of the Act, 21 U.S.C. section 360bbb-3(b)(1), unless the authorization is terminated or revoked sooner.     Influenza A by PCR NEGATIVE NEGATIVE Final   Influenza B by PCR NEGATIVE NEGATIVE Final    Comment: (NOTE) The Xpert Xpress SARS-CoV-2/FLU/RSV plus assay is intended as an aid in the diagnosis of influenza from Nasopharyngeal swab specimens and should not be used as a sole basis for treatment. Nasal washings and aspirates are unacceptable for Xpert Xpress SARS-CoV-2/FLU/RSV testing.  Fact Sheet for Patients: EntrepreneurPulse.com.au  Fact Sheet for  Healthcare Providers: IncredibleEmployment.be  This test is not yet approved or cleared by the [place] FDA and has been authorized for detection and/or diagnosis of SARS-CoV-2 by FDA under an Emergency Use Authorization (EUA). This EUA will remain in effect (meaning this test can be used) for the duration of the COVID-19 declaration under Section 564(b)(1) of the Act, 21 U.S.C. section 360bbb-3(b)(1), unless the authorization is terminated or revoked.  Performed at [facility], [place] [address], [place], [place] [address]      Medications:   . amiodarone  200 mg Oral Daily  . apixaban  5 mg Oral BID  . atorvastatin  40 mg Oral Daily  . baclofen  5 mg Oral BID  . Chlorhexidine Gluconate Cloth  6 each Topical Daily  . cholecalciferol  5,000 Units Oral Q1200  . donepezil  5 mg Oral QHS  . ferrous sulfate  325 mg Oral BID WC  . finasteride  5 mg Oral Q1200  . gabapentin  200 mg Oral TID  . Gerhardt's butt cream   Topical QID  . guaiFENesin  600 mg Oral BID  . insulin aspart  0-15 Units Subcutaneous TID WC  . insulin aspart  0-5 Units Subcutaneous QHS  . insulin glargine  10 Units Subcutaneous QHS  . loratadine  5 mg Oral QHS  . losartan  25 mg Oral Daily  . memantine  5 mg Oral BID  . metoprolol succinate  50 mg Oral Daily  . multivitamin with minerals  1 tablet Oral Daily  . pantoprazole  40 mg Oral Daily  . polyethylene glycol  17 g Oral BID  . potassium chloride  20 mEq Oral Daily  . pregabalin  25 mg Oral Daily  . Ensure Max Protein  11 oz Oral QHS  . sodium chloride flush  10-40 mL Intracatheter Q12H   Continuous Infusions:    LOS: 32 days   Charlynne Cousins  [facility]  11/26/2020, 10:14 AM

## 2020-11-26 NOTE — TOC Progression Note (Signed)
Transition of Care [facility]) - Progression Note    Patient Details  Name: DARREN CALDRON MRN: [id number] Date of Birth: 1951/09/09  Transition of Care [facility]) CM/SW [place], LCSW Phone Number: 11/26/2020, 9:06 AM  Clinical Narrative:    CSW notes patient arrived to 5[facility] last night. Per handoff notes, patient unable to discharge to [facility] until off of isolation precautions on 2/18. CSW will continue to update [facility].    Expected Discharge Plan: [place] Barriers to Discharge: Continued Medical Work up  Expected Discharge Plan and Services Expected Discharge Plan: [place] In-house Referral: Clinical Social Work   Post Acute Care Choice: Hospice Living arrangements for the past 2 months: Single Family Home                                       Social Determinants of Health (SDOH) Interventions    Readmission Risk Interventions No flowsheet data found.

## 2020-11-26 NOTE — NC FL2 (Signed)
[place] LEVEL OF CARE SCREENING TOOL     IDENTIFICATION  Patient Name: [name] Birthdate: 1951/03/09 Sex: male Admission Date (Current Location): 10/25/2020  [facility] and [place] Number:  [employer] and Address:  The [place], [place] [address], [place], [place] [address]      Provider Number: [id number]  Attending Physician Name and Address:  Charlynne Cousins, MD  Relative Name and Phone Number:       Current Level of Care: Hospital Recommended Level of Care: [place] Prior Approval Number:    Date Approved/Denied:   PASRR Number: [id number]  Discharge Plan: SNF    Current Diagnoses: Patient Active Problem List   Diagnosis Date Noted  . Palliative care by specialist   . Goals of care, counseling/discussion   . Osteomyelitis of left foot ([place])   . Cardiomyopathy ([place])   . PAD (peripheral artery disease) /SMA Stenosis/Rt and Lt SFA Stenosis/ 10/28/2020  . SIRS (systemic inflammatory response syndrome) ([place]) 10/26/2020  . Sepsis --due to Infected Lt Foot/Ostepmyleitis---POA 10/26/2020  . Acute osteomyelitis of toe, left ([place]) 10/25/2020  . Diabetic ulcer of left heel ([place]) 10/25/2020  . Fracture of left toe 10/25/2020  . Leukocytosis 10/25/2020  . Hypokalemia 10/25/2020  . Hypoalbuminemia 10/25/2020  . Hyperglycemia due to diabetes mellitus ([employer]) 10/25/2020  . History of stroke 10/25/2020  . Abdominal pain, epigastric 02/09/2017  . Morbid obesity ([place]) 09/17/2016  . Sedentary lifestyle 11/20/2015  . Type 2 diabetes mellitus with vascular disease ([place]) 07/25/2015  . Bilateral carotid artery disease ([place]) 05/01/2015  . Atherosclerosis of artery of extremity with ulceration ([place]) 04/08/2015  . Lower extremity edema 02/11/2015  . Tobacco use disorder 07/16/2014  . Chest pain 06/30/2014  . OSA on CPAP 06/30/2014  . Aftercare following surgery of the circulatory system, [place] 12/27/2013  .  Chronic pain syndrome 11/03/2013  . Contracture of muscle of left upper arm 10/10/2013  . Foot fracture 10/10/2013  . Generalized OA 10/10/2013  . Anemia, iron deficiency 09/26/2013  . Insomnia 09/26/2013  . Stasis dermatitis 07/31/2013  . Peripheral autonomic neuropathy due to diabetes mellitus ([place]) 07/31/2013  . RLS (restless legs syndrome) 07/31/2013  . Allergic rhinitis 07/28/2013  . CAD, multiple vessel, 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 06/21/2013  . Essential hypertension 06/21/2013  . GERD (gastroesophageal reflux disease) 06/21/2013  . BPH (benign prostatic hyperplasia) 06/21/2013  . Cardiomyopathy, ischemic 02/22/2013  . Mixed hyperlipidemia 02/22/2013  . Atrial flutter ([place]) 01/02/2013  . Acute myocardial infarction, subendocardial infarction, initial episode of care ([place]) 01/02/2013  . Occlusion and stenosis of carotid artery without mention of cerebral infarction 10/27/2012  . CVA (cerebral infarction) 06/03/2012    Orientation RESPIRATION BLADDER Height & Weight     Self,Time,Situation,Place  Normal Incontinent,External catheter Weight: 243 lb 2.7 oz (110.3 kg) Height:  5\' 7"  (170.2 cm)  BEHAVIORAL SYMPTOMS/MOOD NEUROLOGICAL BOWEL NUTRITION STATUS      Incontinent Diet (Please see DC Summary)  AMBULATORY STATUS COMMUNICATION OF NEEDS Skin   Extensive Assist Verbally Surgical wounds (Closed incision on left leg)                       Personal Care Assistance Level of Assistance  Bathing,Feeding,Dressing Bathing Assistance: Maximum assistance Feeding assistance: Maximum assistance Dressing Assistance: Maximum assistance     Functional Limitations Info  Sight,Hearing,Speech Sight Info: Adequate Hearing Info: Adequate Speech Info: Adequate    SPECIAL CARE FACTORS FREQUENCY  PT (  By licensed PT),OT (By licensed OT)     PT Frequency: 5x/week OT Frequency: 5x/week            Contractures Contractures Info: Present (LUE)    Additional Factors Info  Code  Status,Allergies,Insulin Sliding Scale Code Status Info: Full Allergies Info: Flexeril, Tamsulosin Hcl, Tramadol, Robaxin, Aspirin, Morphine and related   Insulin Sliding Scale Info: See dc summary       Current Medications (11/26/2020):  This is the current hospital active medication list Current Facility-Administered Medications  Medication Dose Route Frequency Provider Last Rate Last Admin  . acetaminophen (TYLENOL) tablet 650 mg  650 mg Oral Q6H PRN Barbie Banner, PA-C   650 mg at 11/26/20 0543  . amiodarone (PACERONE) tablet 200 mg  200 mg Oral Daily Barbie Banner, PA-C   200 mg at 11/26/20 0815  . apixaban (ELIQUIS) tablet 5 mg  5 mg Oral BID Charlynne Cousins, MD   5 mg at 11/26/20 0815  . atorvastatin (LIPITOR) tablet 40 mg  40 mg Oral Daily Barbie Banner, PA-C   40 mg at 11/26/20 0815  . baclofen (LIORESAL) tablet 5 mg  5 mg Oral BID Charlynne Cousins, MD   5 mg at 11/26/20 0815  . Chlorhexidine Gluconate Cloth 2 % PADS 6 each  6 each Topical Daily Barbie Banner, PA-C   6 each at 11/25/20 1233  . cholecalciferol (VITAMIN D3) tablet 5,000 Units  5,000 Units Oral Q1200 Barbie Banner, PA-C   5,000 Units at 11/25/20 1235  . donepezil (ARICEPT) tablet 5 mg  5 mg Oral QHS Barbie Banner, PA-C   5 mg at 11/25/20 2238  . ferrous sulfate tablet 325 mg  325 mg Oral BID WC Barbie Banner, PA-C   325 mg at 11/26/20 0815  . finasteride (PROSCAR) tablet 5 mg  5 mg Oral Q1200 Barbie Banner, PA-C   5 mg at 11/25/20 1235  . gabapentin (NEURONTIN) capsule 200 mg  200 mg Oral TID Charlynne Cousins, MD   200 mg at 11/26/20 0815  . Gerhardt's butt cream   Topical QID Barbie Banner, PA-C   Given at 11/25/20 2242  . guaiFENesin (MUCINEX) 12 hr tablet 600 mg  600 mg Oral BID Charlynne Cousins, MD   600 mg at 11/26/20 0815  . insulin aspart (novoLOG) injection 0-15 Units  0-15 Units Subcutaneous TID WC Barbie Banner, PA-C   3 Units at 11/26/20 [phone]  . insulin aspart  (novoLOG) injection 0-5 Units  0-5 Units Subcutaneous QHS Barbie Banner, PA-C   2 Units at 11/24/20 2114  . insulin glargine (LANTUS) injection 10 Units  10 Units Subcutaneous QHS Barbie Banner, PA-C   10 Units at 11/25/20 2355  . levalbuterol (XOPENEX) nebulizer solution 1.25 mg  1.25 mg Nebulization Q6H PRN Setzer, [address], PA-C      . loratadine (CLARITIN) tablet 5 mg  5 mg Oral QHS Barbie Banner, PA-C   5 mg at 11/25/20 2236  . losartan (COZAAR) tablet 25 mg  25 mg Oral Daily Charlynne Cousins, MD   25 mg at 11/26/20 0817  . memantine (NAMENDA) tablet 5 mg  5 mg Oral BID Barbie Banner, PA-C   5 mg at 11/26/20 0816  . metoprolol succinate (TOPROL-XL) 24 hr tablet 50 mg  50 mg Oral Daily Charlynne Cousins, MD   50 mg at 11/26/20 0816  . multivitamin with minerals tablet 1  tablet  1 tablet Oral Daily Charlynne Cousins, MD   1 tablet at 11/26/20 [phone]  . ondansetron (ZOFRAN) injection 4 mg  4 mg Intravenous Q6H PRN Barbie Banner, PA-C   4 mg at 11/03/20 1840  . oxyCODONE (Oxy IR/ROXICODONE) immediate release tablet 10 mg  10 mg Oral Q6H PRN Charlynne Cousins, MD   10 mg at 11/25/20 1031  . pantoprazole (PROTONIX) EC tablet 40 mg  40 mg Oral Daily Barbie Banner, PA-C   40 mg at 11/26/20 0815  . polyethylene glycol (MIRALAX / GLYCOLAX) packet 17 g  17 g Oral BID Charlynne Cousins, MD   17 g at 11/26/20 1027  . potassium chloride SA (KLOR-CON) CR tablet 20 mEq  20 mEq Oral Daily Barbie Banner, PA-C   20 mEq at 11/26/20 0815  . pregabalin (LYRICA) capsule 25 mg  25 mg Oral Daily Charlynne Cousins, MD   25 mg at 11/26/20 0817  . protein supplement (ENSURE MAX) liquid  11 oz Oral QHS Charlynne Cousins, MD      . sodium chloride flush (NS) 0.9 % injection 10-40 mL  10-40 mL Intracatheter Q12H Barbie Banner, PA-C   10 mL at 11/25/20 2237  . sodium chloride flush (NS) 0.9 % injection 10-40 mL  10-40 mL Intracatheter PRN Vaughan Basta [address], PA-C         Discharge  Medications: Please see discharge summary for a list of discharge medications.  Relevant Imaging Results:  Relevant Lab Results:   Additional Information SSN: [id number]. COVID + on 11/19/20. Has received Moderna vaccines 02/07/20, 03/06/20, 09/30/20  Benard Halsted, LCSW

## 2020-11-26 NOTE — Progress Notes (Signed)
   11/26/20 1656  Assess: MEWS Score  Temp 97.9 F (36.6 C)  BP (!) 98/56  Pulse Rate (!) 102  ECG Heart Rate (!) 103  Resp 20  SpO2 100 %  O2 Device Room Air  Assess: MEWS Score  MEWS Temp 0  MEWS Systolic 1  MEWS Pulse 1  MEWS RR 0  MEWS LOC 0  MEWS Score 2  MEWS Score Color Yellow  Assess: if the MEWS score is Yellow or Red  Were vital signs taken at a resting state? Yes  Focused Assessment Change from prior assessment (see assessment flowsheet)  Early Detection of Sepsis Score *See Row Information* Low  MEWS guidelines implemented *See Row Information* Yes  Take Vital Signs  Increase Vital Sign Frequency  Yellow: Q 2hr X 2 then Q 4hr X 2, if remains yellow, continue Q 4hrs  Escalate  MEWS: Escalate Yellow: discuss with charge nurse/RN and consider discussing with provider and RRT  Notify: Charge Nurse/RN  Name of Charge Nurse/RN Notified Elisa RN  Date Charge Nurse/RN Notified 11/26/20  Time Charge Nurse/RN Notified 1659  Document  Patient Outcome Other (Comment) (pt low BP known by MD and continuing problem)

## 2020-11-27 ENCOUNTER — Encounter (HOSPITAL_COMMUNITY): Payer: Medicare Other

## 2020-11-27 ENCOUNTER — Ambulatory Visit: Payer: Medicare Other | Admitting: Vascular Surgery

## 2020-11-27 DIAGNOSIS — U071 COVID-19: Secondary | ICD-10-CM

## 2020-11-27 LAB — GLUCOSE, CAPILLARY
Glucose-Capillary: 145 mg/dL — ABNORMAL HIGH (ref 70–99)
Glucose-Capillary: 169 mg/dL — ABNORMAL HIGH (ref 70–99)
Glucose-Capillary: 161 mg/dL — ABNORMAL HIGH (ref 70–99)
Glucose-Capillary: 127 mg/dL — ABNORMAL HIGH (ref 70–99)

## 2020-11-27 LAB — BASIC METABOLIC PANEL
Anion gap: 9 (ref 5–15)
BUN: 16 mg/dL (ref 8–23)
CO2: 23 mmol/L (ref 22–32)
Calcium: 8.2 mg/dL — ABNORMAL LOW (ref 8.9–10.3)
Chloride: 103 mmol/L (ref 98–111)
Creatinine, Ser: 0.88 mg/dL (ref 0.61–1.24)
GFR, Estimated: >60 mL/min (ref >60)
Glucose, Bld: 181 mg/dL — ABNORMAL HIGH (ref 70–99)
Potassium: 4.3 mmol/L (ref 3.5–5.1)
Sodium: 135 mmol/L (ref 135–145)

## 2020-11-27 NOTE — TOC Progression Note (Signed)
Transition of Care [facility]) - Progression Note    Patient Details  Name: SZYMON FOILES MRN: [id number] Date of Birth: Aug 09, 1951  Transition of Care [facility]) CM/SW [place], LCSW Phone Number: 11/27/2020, 3:38 PM  Clinical Narrative:    CSW confirmed with [facility], that they can accept patient on the 18th.    Expected Discharge Plan: [place] Barriers to Discharge: Continued Medical Work up  Expected Discharge Plan and Services Expected Discharge Plan: [place] In-house Referral: Clinical Social Work   Post Acute Care Choice: Hospice Living arrangements for the past 2 months: Single Family Home                                       Social Determinants of Health (SDOH) Interventions    Readmission Risk Interventions No flowsheet data found.

## 2020-11-27 NOTE — Progress Notes (Signed)
[facility] PROGRESS NOTE    Progress Note  [name]  JOA:[id number] DOB: [birth year] DOA: 10/25/2020 PCP: Leeanne Rio, MD     Brief Narrative:   [name] is an 70 y.o. 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  Hospital course complicated by acute hypoxic respiratory failure due to decompensated systolic heart failure.  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   Significant Events: 1/14 >> admit to [facility]  1/17 >> transferred to [facility]  1/31 >> left AKA   Significant studies: 1/8>> Echo: EF 25-30% 1/17>> CT angio abdominal aorta with iliofemoral runoff: 70-80% proximal SMA stenosis, diffuse disease throughout the right SFA-with maximal stenosis approaching 50-70%, occlusion of left superficial femoral artery, severe disease of left tibiofemoral trunk 2/2>> right upper extremity duplex ultrasound: No DVT  Microbiology data: 2/1>> blood culture: Negative till date  Procedures: 11/11/2020 above-the-knee amputation by vascular surgery  Assessment/Plan:   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  Blood cultures negative to date.  COVID-19 infection: Incidental finding-we will complete isolation on 2/18.  Finished 3 days of Remdesivir.  Acute metabolic/toxic encephalopathy: Felt to be due to polypharmacy-has resolved.  Completely awake and alert.  Acute perioperative blood loss anemia: Hemoglobin stable-no indication for transfusion.  Chronic combined systolic and diastolic heart failure: Euvolemic-continue Lasix/Toprol/losartan.  Outpatient  follow-up with cardiology.  Persistent atrial flutter: Rate controlled-on metoprolol amiodarone and Eliquis.  History of CAD s/p CABG: No longer on antiplatelets is on Eliquis-on beta-blocker/statin.  Acute respiratory failure with hypoxia: Occurred postoperatively-currently resolved  DM-2 (A1c 6.9 on 1/14): CBG stable-continue Lantus 10 units daily and SSI.  Follow and adjust.  Recent Labs    11/26/20 1659 11/26/20 2107 11/27/20 0714  GLUCAP 155* 200* 145*   History of CVA with left hemiparesis:Continue Eliquis.  Chronic kidney disease stage SW:FUXNATFTDD at baseline.  Dementia:Continue Aricept, Namenda.  UKG:URKYHCWC finasteride.  Probable early cirrhosis: Seen incidentally on CT angio-stable for outpatient monitoring/work-up.  DVT prophylaxis: Eliquis Family Communication:Wanda-wife [phone]-OHYW voicemail 2/16. Status is: Inpatient  Remains inpatient appropriate because:Hemodynamically unstable   Dispo: The patient is from: Home              Anticipated d/c is to: SNF              Anticipated d/c date is: 0 days              Patient currently is medically stable to d/c.Awaiting SNF   Difficult to place patient Yes   Code Status:     Code Status Orders  (From admission, onward)         Start     Ordered   11/09/20 0939  Full code  Continuous        11/09/20 0938        Code Status History    Date Active Date Inactive Code Status Order ID Comments User Context   11/01/2020 1455 11/09/2020 0938 DNR [id number]  Rosezella Rumpf, NP Inpatient   10/31/2020 1439 11/01/2020 1455 Full Code [id number]  Florencia Reasons, MD Inpatient   10/31/2020 1214 10/31/2020 1439 DNR [id number]  Florencia Reasons, MD Inpatient   10/25/2020 2246 10/31/2020  [place] Full Code [id number]  Bernadette Hoit, DO ED   04/08/2015 1151 04/08/2015 2143 Full Code [id number]  Angelia Mould, MD Inpatient   06/30/2014 1428 07/01/2014 1905 Full Code [id number]  Samuella Cota, MD Inpatient   01/12/2013 0804  01/19/2013 1823 Full Code [id number]  Grace Isaac, MD Inpatient   01/09/2013 1430 01/12/2013 0804 Full Code [id number]  Nani Skillern, PA-C Inpatient   Advance Care Planning Activity        IV Access:    Peripheral IV   Procedures and diagnostic studies:   DG CHEST PORT 1 VIEW  Result Date: 11/26/2020 CLINICAL DATA:  Dyspnea. EXAM: PORTABLE CHEST 1 VIEW COMPARISON:  November 19, 2020. FINDINGS: The heart size and mediastinal contours are within normal limits. Status post coronary bypass graft. No pneumothorax or pleural effusion is noted. Both lungs are clear. The visualized skeletal structures are unremarkable. IMPRESSION: No active disease. Electronically Signed   By: Marijo Conception M.D.   On: 11/26/2020 11:40     Medical Consultants:    None.  Anti-Infectives:   None  Subjective:    [name] relates pain all over.  Objective:    Vitals:   11/27/20 0210 11/27/20 0400 11/27/20 0730 11/27/20 0814  BP: 121/78 130/70 102/81 119/86  Pulse: (!) 105 97 99   Resp: 15 16 18 17   Temp: 98.1 F (36.7 C) 98.2 F (36.8 C) 98 F (36.7 C) 97.9 F (36.6 C)  TempSrc: Oral Oral Axillary Axillary  SpO2: 95% 100% 100% 100%  Weight:      Height:       SpO2: 100 % O2 Flow Rate (L/min): 1 L/min FiO2 (%): 28 %   Intake/Output Summary (Last 24 hours) at 11/27/2020 1137 Last data filed at 11/27/2020 0854 Gross per 24 hour  Intake 1073.5 ml  Output 1025 ml  Net 48.5 ml   Filed Weights   10/25/20 1824 10/26/20 1850 10/27/20 0500  Weight: 116.1 kg 110.5 kg 110.3 kg    Exam: Gen Exam:Alert awake-not in any distress HEENT:atraumatic, normocephalic Chest: B/L clear to auscultation anteriorly CVS:S1S2 regular Abdomen:soft non tender, non distended Extremities: Left AKA Neurology: Non focal Skin: no rash  Data Reviewed:    Labs: Basic Metabolic Panel: Recent Labs  Lab 11/22/20 0943 11/25/20 1900 11/26/20 0425 11/27/20 0138  NA 136 132* 135 135  K  4.6 4.3 4.5 4.3  CL 104 98 102 103  CO2 23 24 24 23   GLUCOSE 116* 177* 159* 181*  BUN 16 18 16 16   CREATININE 1.01 0.97 0.94 0.88  CALCIUM 8.5* 8.4* 8.4* 8.2*   GFR Estimated Creatinine Clearance: 95.2 mL/min (by C-G formula based on SCr of 0.88 mg/dL). Liver Function Tests: No results for input(s): AST, ALT, ALKPHOS, BILITOT, PROT, ALBUMIN in the last 168 hours. No results for input(s): LIPASE, AMYLASE in the last 168 hours. No results for input(s): AMMONIA in the last 168 hours. Coagulation profile No results for input(s): INR, PROTIME in the last 168 hours. COVID-19 Labs  No results for input(s): DDIMER, FERRITIN, LDH, CRP in the last 72 hours.  Lab Results  Component Value Date   SARSCOV2NAA POSITIVE (A) 11/19/2020   SARSCOV2NAA POSITIVE (A) 11/18/2020   SARSCOV2NAA NEGATIVE 11/10/2020   [place] NEGATIVE 10/25/2020    CBC: Recent Labs  Lab 11/22/20 0943  WBC 12.1*  NEUTROABS 7.3  HGB 11.2*  HCT 34.0*  MCV 95.8  PLT 269   Cardiac Enzymes: Recent Labs  Lab 11/21/20  0441  CKTOTAL 31*   BNP (last 3 results) No results for input(s): PROBNP in the last 8760 hours. CBG: Recent Labs  Lab 11/26/20 0747 11/26/20 1205 11/26/20 1659 11/26/20 2107 11/27/20 0714  GLUCAP 157* 145* 155* 200* 145*   D-Dimer: No results for input(s): DDIMER in the last 72 hours. Hgb A1c: No results for input(s): HGBA1C in the last 72 hours. Lipid Profile: No results for input(s): CHOL, HDL, LDLCALC, TRIG, CHOLHDL, LDLDIRECT in the last 72 hours. Thyroid function studies: No results for input(s): TSH, T4TOTAL, T3FREE, THYROIDAB in the last 72 hours.  Invalid input(s): FREET3 Anemia work up: No results for input(s): VITAMINB12, FOLATE, FERRITIN, TIBC, IRON, RETICCTPCT in the last 72 hours. Sepsis Labs: Recent Labs  Lab 11/22/20 0943  WBC 12.1*   Microbiology Recent Results (from the past 240 hour(s))  SARS CORONAVIRUS 2 (TAT 6-24 HRS) Nasopharyngeal Nasopharyngeal Swab      Status: Abnormal   Collection Time: 11/18/20  2:46 PM   Specimen: Nasopharyngeal Swab  Result Value Ref Range Status   SARS Coronavirus 2 POSITIVE (A) NEGATIVE Final    Comment: (NOTE) SARS-CoV-2 target nucleic acids are DETECTED.  The SARS-CoV-2 RNA is generally detectable in upper and lower respiratory specimens during the acute phase of infection. Positive results are indicative of the presence of SARS-CoV-2 RNA. Clinical correlation with patient history and other diagnostic information is  necessary to determine patient infection status. Positive results do not rule out bacterial infection or co-infection with other viruses.  The expected result is Negative.  Fact Sheet for Patients: SugarRoll.be  Fact Sheet for Healthcare Providers: https://www.woods-mathews.com/  This test is not yet approved or cleared by the [place] FDA and  has been authorized for detection and/or diagnosis of SARS-CoV-2 by FDA under an Emergency Use Authorization (EUA). This EUA will remain  in effect (meaning this test can be used) for the duration of the COVID-19 declaration under Section 564(b)(1) of the Act, 21 U. S.C. section 360bbb-3(b)(1), unless the authorization is terminated or revoked sooner.   Performed at [facility], [place] [address], [place], [place] [address]   Resp Panel by RT-PCR (Flu A&B, Covid) Nasopharyngeal Swab     Status: Abnormal   Collection Time: 11/19/20  4:11 PM   Specimen: Nasopharyngeal Swab; Nasopharyngeal(NP) swabs in vial transport medium  Result Value Ref Range Status   SARS Coronavirus 2 by RT PCR POSITIVE (A) NEGATIVE Final    Comment: RESULT CALLED TO, READ BACK BY AND VERIFIED WITH: Gordy Clement RN [id number] 11/19/20 A BROWNING (NOTE) SARS-CoV-2 target nucleic acids are DETECTED.  The SARS-CoV-2 RNA is generally detectable in upper respiratory specimens during the acute phase of infection. Positive results  are indicative of the presence of the identified virus, but do not rule out bacterial infection or co-infection with other pathogens not detected by the test. Clinical correlation with patient history and other diagnostic information is necessary to determine patient infection status. The expected result is Negative.  Fact Sheet for Patients: EntrepreneurPulse.com.au  Fact Sheet for Healthcare Providers: IncredibleEmployment.be  This test is not yet approved or cleared by the [place] FDA and  has been authorized for detection and/or diagnosis of SARS-CoV-2 by FDA under an Emergency Use Authorization (EUA).  This EUA will remain in effect (meaning this test can b e used) for the duration of  the COVID-19 declaration under Section 564(b)(1) of the Act, 21 U.S.C. section 360bbb-3(b)(1), unless the authorization is terminated or revoked sooner.  Influenza A by PCR NEGATIVE NEGATIVE Final   Influenza B by PCR NEGATIVE NEGATIVE Final    Comment: (NOTE) The Xpert Xpress SARS-CoV-2/FLU/RSV plus assay is intended as an aid in the diagnosis of influenza from Nasopharyngeal swab specimens and should not be used as a sole basis for treatment. Nasal washings and aspirates are unacceptable for Xpert Xpress SARS-CoV-2/FLU/RSV testing.  Fact Sheet for Patients: EntrepreneurPulse.com.au  Fact Sheet for Healthcare Providers: IncredibleEmployment.be  This test is not yet approved or cleared by the [place] FDA and has been authorized for detection and/or diagnosis of SARS-CoV-2 by FDA under an Emergency Use Authorization (EUA). This EUA will remain in effect (meaning this test can be used) for the duration of the COVID-19 declaration under Section 564(b)(1) of the Act, 21 U.S.C. section 360bbb-3(b)(1), unless the authorization is terminated or revoked.  Performed at [facility], [place] [address],  [place], [place] [address]      Medications:   . amiodarone  200 mg Oral Daily  . apixaban  5 mg Oral BID  . atorvastatin  40 mg Oral Daily  . baclofen  5 mg Oral BID  . Chlorhexidine Gluconate Cloth  6 each Topical Daily  . cholecalciferol  5,000 Units Oral Q1200  . donepezil  5 mg Oral QHS  . ferrous sulfate  325 mg Oral BID WC  . finasteride  5 mg Oral Q1200  . gabapentin  200 mg Oral TID  . Gerhardt's butt cream   Topical QID  . guaiFENesin  600 mg Oral BID  . insulin aspart  0-15 Units Subcutaneous TID WC  . insulin aspart  0-5 Units Subcutaneous QHS  . insulin glargine  10 Units Subcutaneous QHS  . loratadine  5 mg Oral QHS  . losartan  25 mg Oral Daily  . memantine  5 mg Oral BID  . metoprolol succinate  50 mg Oral Daily  . multivitamin with minerals  1 tablet Oral Daily  . pantoprazole  40 mg Oral Daily  . potassium chloride  20 mEq Oral Daily  . pregabalin  25 mg Oral Daily  . Ensure Max Protein  11 oz Oral QHS  . sodium chloride flush  10-40 mL Intracatheter Q12H   Continuous Infusions:    LOS: 33 days   Lucion Dilger  [facility]  11/27/2020, 11:37 AM

## 2020-11-27 NOTE — Care Management Important Message (Signed)
Important Message  Patient Details  Name: [name] MRN: [id number] Date of Birth: 09-06-1951   Medicare Important Message Given:  Yes - Important Message mailed due to current National Emergency   Verbal consent obtained due to current National Emergency  Relationship to patient: Self Contact Name: Jdyn Parkerson Call Date: 11/27/20  Time: 1520 Phone: [phone] Outcome: No Answer/Busy Important Message mailed to: Patient address on file      [name] 11/27/2020, 3:20 PM

## 2020-11-27 NOTE — Progress Notes (Signed)
Occupational Therapy Treatment Patient Details Name: [name] MRN: [id number] DOB: 24-Feb-1951 Today's Date: 11/27/2020    History of present illness 70 y.o. male with a PMH of CAD s/p CABG in 2014,  CHF, paroxysmal atrial flutter,  PVD, HTN, HLD, DM type 2, and CVA with left sided hemiparesis, who was initially seen for hypoxic respiratory failure from CHF. Pt also with L foot osteomyelitis and developed sepsis. He is s/p L AKA.   OT comments  Pt seen at bed level due to decreased BP.  He was moved into chair position - see below for BP readings.  He tolerated upright posture in chair position for 16 mins with BP slowly decreasing. He performed theraband strengthening exercises. With cues. Continue to recommend SNF.   Follow Up Recommendations  SNF    Equipment Recommendations  None recommended by OT    Recommendations for Other Services      Precautions / Restrictions Precautions Precautions: Fall Precaution Comments: monitor BP Restrictions Weight Bearing Restrictions: Yes LLE Weight Bearing: Non weight bearing Other Position/Activity Restrictions: new AKA       Mobility Bed Mobility                  Transfers                      Balance                                           ADL either performed or assessed with clinical judgement   ADL Overall ADL's : Needs assistance/impaired     Grooming: Minimal assistance;Bed level                                       Vision       Perception     Praxis      Cognition Arousal/Alertness: Awake/alert Behavior During Therapy: WFL for tasks assessed/performed Overall Cognitive Status: No family/caregiver present to determine baseline cognitive functioning                                 General Comments: Pt with poor attention - requires max cues to stay on task.  he asks repeated questions with decreased recall of answers previously provided.   He requires mod A for problem solving        Exercises Exercises: General Upper Extremity General Exercises - Upper Extremity Shoulder Flexion: Strengthening;Right;10 reps;Supine Shoulder Horizontal ABduction: Strengthening;10 reps;Supine;Theraband;Right Theraband Level (Shoulder Horizontal Abduction): Level 1 (Yellow) Elbow Flexion: Strengthening;Right;10 reps;Supine   Shoulder Instructions       General Comments BP supine 96/52; pt moved into chair position in the bed with BP 108/55;  at 9 mins 81/59,He performed below theraband exercises while in chair position.  BP at 16 mins 72/50.    He was returned to supine, and RN notified of BP.    Pertinent Vitals/ Pain       Pain Assessment: Faces Faces Pain Scale: Hurts little more Pain Location: Residual limb, Phantom pain Pain Descriptors / Indicators: Aching;Burning Pain Intervention(s): Repositioned;Monitored during session;Patient requesting pain meds-RN notified  Home Living  Prior Functioning/Environment              Frequency  Min 2X/week        Progress Toward Goals  OT Goals(current goals can now be found in the care plan section)  Progress towards OT goals: Not progressing toward goals - comment (due to hypotension)     Plan Discharge plan remains appropriate    Co-evaluation                 AM-PAC OT "6 Clicks" Daily Activity     Outcome Measure   Help from another person eating meals?: A Little Help from another person taking care of personal grooming?: A Lot Help from another person toileting, which includes using toliet, bedpan, or urinal?: Total Help from another person bathing (including washing, rinsing, drying)?: A Lot Help from another person to put on and taking off regular upper body clothing?: A Lot Help from another person to put on and taking off regular lower body clothing?: Total 6 Click Score: 11    End of Session     OT Visit Diagnosis: Cognitive communication deficit (R41.841);Unsteadiness on feet (R26.81) Pain - Right/Left: Left Pain - part of body: Leg   Activity Tolerance Treatment limited secondary to medical complications (Comment) (increased BP)   Patient Left in bed;with call bell/phone within reach   Nurse Communication Mobility status        Time: 1055-1130 OT Time Calculation (min): 35 min  Charges: OT General Charges $OT Visit: 1 Visit OT Treatments $Therapeutic Activity: 8-22 mins $Therapeutic Exercise: 8-22 mins  [name]., OTR/L Acute Rehabilitation Services Pager [phone] Office [phone]    [name] 11/27/2020, 2:25 PM

## 2020-11-28 LAB — GLUCOSE, CAPILLARY
Glucose-Capillary: 165 mg/dL — ABNORMAL HIGH (ref 70–99)
Glucose-Capillary: 143 mg/dL — ABNORMAL HIGH (ref 70–99)
Glucose-Capillary: 173 mg/dL — ABNORMAL HIGH (ref 70–99)
Glucose-Capillary: 181 mg/dL — ABNORMAL HIGH (ref 70–99)

## 2020-11-28 MED ORDER — GABAPENTIN 300 MG PO CAPS
300.0000 mg | ORAL_CAPSULE | Freq: Three times a day (TID) | ORAL | Status: DC
  Administered 2020-11-28 – 2020-11-29 (×3): 300 mg via ORAL
  Filled 2020-11-28 (×3): qty 1

## 2020-11-28 NOTE — Progress Notes (Signed)
[facility] PROGRESS NOTE    Progress Note  [name]  AYT:[id number] DOB: [birth year] DOA: 10/25/2020 PCP: Leeanne Rio, MD     Brief Narrative:   [name] is an 70 y.o. 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  Hospital course complicated by acute hypoxic respiratory failure due to decompensated systolic heart failure.  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   Significant Events: 1/14 >> admit to [facility]  1/17 >> transferred to [facility]  1/31 >> left AKA   Significant studies: 1/8>> Echo: EF 25-30% 1/17>> CT angio abdominal aorta with iliofemoral runoff: 70-80% proximal SMA stenosis, diffuse disease throughout the right SFA-with maximal stenosis approaching 50-70%, occlusion of left superficial femoral artery, severe disease of left tibiofemoral trunk 2/2>> right upper extremity duplex ultrasound: No DVT  Microbiology data: 2/1>> blood culture: Negative till date  Procedures: 11/11/2020 above-the-knee amputation by vascular surgery  Subjective: Lying comfortably in bed-complains of some generalized chronic pain.  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  Blood cultures negative to date.  COVID-19 infection: Incidental finding-we will complete isolation on 2/18.  Finished 3 days of Remdesivir.  Acute metabolic/toxic encephalopathy: Felt to be due to polypharmacy-has resolved.  Completely awake and alert.  Acute perioperative blood loss anemia: Hemoglobin stable-no indication for transfusion.  Chronic combined systolic and  diastolic heart failure: Euvolemic-continue Lasix/Toprol/losartan.  Outpatient follow-up with cardiology.  Persistent atrial flutter: Rate controlled-on metoprolol amiodarone and Eliquis.  History of CAD s/p CABG: No longer on antiplatelets is on Eliquis-on beta-blocker/statin.  Acute respiratory failure with hypoxia: Occurred postoperatively-currently resolved  DM-2 (A1c 6.9 on 1/14): CBG stable-continue Lantus 10 units daily and SSI.  Follow and adjust.  Recent Labs    11/27/20 2038 11/28/20 0744 11/28/20 1156  GLUCAP 127* 165* 143*   History of CVA with left hemiparesis:Continue Eliquis.  Chronic kidney disease stage TF:TDDUKGURKY at baseline.  Dementia:Continue Aricept, Namenda.  HCW:CBJSEGBT finasteride.  Probable early cirrhosis: Seen incidentally on CT angio-stable for outpatient monitoring/work-up.  DVT prophylaxis: Eliquis Family Communication:[name]-wife [phone]-updated on 2/17. Status is: Inpatient  Remains inpatient appropriate because:Hemodynamically unstable   Dispo: The patient is from: Home              Anticipated d/c is to: SNF              Anticipated d/c date is: 0 days              Patient currently is medically stable to d/c.Awaiting SNF   Difficult to place patient Yes   Total time spent-15 minutes  Code Status:     Code Status Orders  (From admission, onward)         Start     Ordered   11/09/20 0939  Full code  Continuous        11/09/20 0938        Code Status History    Date Active Date Inactive Code Status Order ID Comments User Context   11/01/2020 1455 11/09/2020 0938 DNR [id number]  [name] Rumpf, NP Inpatient   10/31/2020 1439 11/01/2020 1455 Full Code [id number]  [name] Reasons, MD Inpatient  10/31/2020 1214 10/31/2020 1439 DNR [id number]  [name] Reasons, MD Inpatient   10/25/2020 2246 10/31/2020 1214 Full Code [id number]  Bernadette Hoit, DO ED   04/08/2015 1151 04/08/2015 2143 Full Code [id number]  Angelia Mould, MD  Inpatient   06/30/2014 1428 07/01/2014 1905 Full Code [id number]  [name] Cota, MD Inpatient   01/12/2013 0804 01/19/2013 1823 Full Code [id number]  [name] Isaac, MD Inpatient   01/09/2013 1430 01/12/2013 0804 Full Code [id number]  [name] Skillern, PA-C Inpatient   Advance Care Planning Activity        IV Access:    Peripheral IV   Procedures and diagnostic studies:   No results found.   Medical Consultants:    None.  Anti-Infectives:   None  Subjective:    [name] relates pain all over.  Objective:    Vitals:   11/28/20 0345 11/28/20 0742 11/28/20 0834 11/28/20 1158  BP: 105/78 111/81  95/75  Pulse: 100 (!) 105  97  Resp: 20 19  14   Temp: 97.9 F (36.6 C) 98 F (36.7 C)  97.6 F (36.4 C)  TempSrc: Axillary Oral  Oral  SpO2: 97% 95% 100% 100%  Weight:      Height:       SpO2: 100 % O2 Flow Rate (L/min): 1 L/min FiO2 (%): 28 %   Intake/Output Summary (Last 24 hours) at 11/28/2020 1205 Last data filed at 11/28/2020 0825 Gross per 24 hour  Intake 240 ml  Output --  Net 240 ml   Filed Weights   10/25/20 1824 10/26/20 1850 10/27/20 0500  Weight: 116.1 kg 110.5 kg 110.3 kg    Exam: Gen Exam:Alert awake-not in any distress HEENT:atraumatic, normocephalic Chest: B/L clear to auscultation anteriorly CVS:S1S2 regular Abdomen:soft non tender, non distended Extremities: Left AKA Neurology: Non focal Skin: no rash  Data Reviewed:    Labs: Basic Metabolic Panel: Recent Labs  Lab 11/22/20 0943 11/25/20 1900 11/26/20 0425 11/27/20 0138  NA 136 132* 135 135  K 4.6 4.3 4.5 4.3  CL 104 98 102 103  CO2 23 24 24 23   GLUCOSE 116* 177* 159* 181*  BUN 16 18 16 16   CREATININE 1.01 0.97 0.94 0.88  CALCIUM 8.5* 8.4* 8.4* 8.2*   GFR Estimated Creatinine Clearance: 95.2 mL/min (by C-G formula based on SCr of 0.88 mg/dL). Liver Function Tests: No results for input(s): AST, ALT, ALKPHOS, BILITOT, PROT, ALBUMIN in the last 168 hours. No  results for input(s): LIPASE, AMYLASE in the last 168 hours. No results for input(s): AMMONIA in the last 168 hours. Coagulation profile No results for input(s): INR, PROTIME in the last 168 hours. COVID-19 Labs  No results for input(s): DDIMER, FERRITIN, LDH, CRP in the last 72 hours.  Lab Results  Component Value Date   SARSCOV2NAA POSITIVE (A) 11/19/2020   SARSCOV2NAA POSITIVE (A) 11/18/2020   SARSCOV2NAA NEGATIVE 11/10/2020   [place] NEGATIVE 10/25/2020    CBC: Recent Labs  Lab 11/22/20 0943  WBC 12.1*  NEUTROABS 7.3  HGB 11.2*  HCT 34.0*  MCV 95.8  PLT 269   Cardiac Enzymes: No results for input(s): CKTOTAL, CKMB, CKMBINDEX, TROPONINI in the last 168 hours. BNP (last 3 results) No results for input(s): PROBNP in the last 8760 hours. CBG: Recent Labs  Lab 11/27/20 1151 11/27/20 1657 11/27/20 2038 11/28/20 0744 11/28/20 1156  GLUCAP 169* 161* 127* 165* 143*   D-Dimer: No results for input(s): DDIMER in the last 72 hours. Hgb A1c: No results for  input(s): HGBA1C in the last 72 hours. Lipid Profile: No results for input(s): CHOL, HDL, LDLCALC, TRIG, CHOLHDL, LDLDIRECT in the last 72 hours. Thyroid function studies: No results for input(s): TSH, T4TOTAL, T3FREE, THYROIDAB in the last 72 hours.  Invalid input(s): FREET3 Anemia work up: No results for input(s): VITAMINB12, FOLATE, FERRITIN, TIBC, IRON, RETICCTPCT in the last 72 hours. Sepsis Labs: Recent Labs  Lab 11/22/20 0943  WBC 12.1*   Microbiology Recent Results (from the past 240 hour(s))  SARS CORONAVIRUS 2 (TAT 6-24 HRS) Nasopharyngeal Nasopharyngeal Swab     Status: Abnormal   Collection Time: 11/18/20  2:46 PM   Specimen: Nasopharyngeal Swab  Result Value Ref Range Status   SARS Coronavirus 2 POSITIVE (A) NEGATIVE Final    Comment: (NOTE) SARS-CoV-2 target nucleic acids are DETECTED.  The SARS-CoV-2 RNA is generally detectable in upper and lower respiratory specimens during the acute  phase of infection. Positive results are indicative of the presence of SARS-CoV-2 RNA. Clinical correlation with patient history and other diagnostic information is  necessary to determine patient infection status. Positive results do not rule out bacterial infection or co-infection with other viruses.  The expected result is Negative.  Fact Sheet for Patients: SugarRoll.be  Fact Sheet for Healthcare Providers: https://www.woods-mathews.com/  This test is not yet approved or cleared by the [place] FDA and  has been authorized for detection and/or diagnosis of SARS-CoV-2 by FDA under an Emergency Use Authorization (EUA). This EUA will remain  in effect (meaning this test can be used) for the duration of the COVID-19 declaration under Section 564(b)(1) of the Act, 21 U. S.C. section 360bbb-3(b)(1), unless the authorization is terminated or revoked sooner.   Performed at [facility], [place] [address], [place], [place] [address]   Resp Panel by RT-PCR (Flu A&B, Covid) Nasopharyngeal Swab     Status: Abnormal   Collection Time: 11/19/20  4:11 PM   Specimen: Nasopharyngeal Swab; Nasopharyngeal(NP) swabs in vial transport medium  Result Value Ref Range Status   SARS Coronavirus 2 by RT PCR POSITIVE (A) NEGATIVE Final    Comment: RESULT CALLED TO, READ BACK BY AND VERIFIED WITH: Gordy Clement RN 0370 11/19/20 A BROWNING (NOTE) SARS-CoV-2 target nucleic acids are DETECTED.  The SARS-CoV-2 RNA is generally detectable in upper respiratory specimens during the acute phase of infection. Positive results are indicative of the presence of the identified virus, but do not rule out bacterial infection or co-infection with other pathogens not detected by the test. Clinical correlation with patient history and other diagnostic information is necessary to determine patient infection status. The expected result is Negative.  Fact Sheet for  Patients: EntrepreneurPulse.com.au  Fact Sheet for Healthcare Providers: IncredibleEmployment.be  This test is not yet approved or cleared by the [place] FDA and  has been authorized for detection and/or diagnosis of SARS-CoV-2 by FDA under an Emergency Use Authorization (EUA).  This EUA will remain in effect (meaning this test can b e used) for the duration of  the COVID-19 declaration under Section 564(b)(1) of the Act, 21 U.S.C. section 360bbb-3(b)(1), unless the authorization is terminated or revoked sooner.     Influenza A by PCR NEGATIVE NEGATIVE Final   Influenza B by PCR NEGATIVE NEGATIVE Final    Comment: (NOTE) The Xpert Xpress SARS-CoV-2/FLU/RSV plus assay is intended as an aid in the diagnosis of influenza from Nasopharyngeal swab specimens and should not be used as a sole basis for treatment. Nasal washings and aspirates are unacceptable  for Xpert Xpress SARS-CoV-2/FLU/RSV testing.  Fact Sheet for Patients: EntrepreneurPulse.com.au  Fact Sheet for Healthcare Providers: IncredibleEmployment.be  This test is not yet approved or cleared by the [place] FDA and has been authorized for detection and/or diagnosis of SARS-CoV-2 by FDA under an Emergency Use Authorization (EUA). This EUA will remain in effect (meaning this test can be used) for the duration of the COVID-19 declaration under Section 564(b)(1) of the Act, 21 U.S.C. section 360bbb-3(b)(1), unless the authorization is terminated or revoked.  Performed at [facility], [place] [address], [place], [place] [address]      Medications:   . amiodarone  200 mg Oral Daily  . apixaban  5 mg Oral BID  . atorvastatin  40 mg Oral Daily  . baclofen  5 mg Oral BID  . Chlorhexidine Gluconate Cloth  6 each Topical Daily  . cholecalciferol  5,000 Units Oral Q1200  . donepezil  5 mg Oral QHS  . ferrous sulfate  325 mg Oral BID WC   . finasteride  5 mg Oral Q1200  . gabapentin  200 mg Oral TID  . Gerhardt's butt cream   Topical QID  . guaiFENesin  600 mg Oral BID  . insulin aspart  0-15 Units Subcutaneous TID WC  . insulin aspart  0-5 Units Subcutaneous QHS  . insulin glargine  10 Units Subcutaneous QHS  . loratadine  5 mg Oral QHS  . losartan  25 mg Oral Daily  . memantine  5 mg Oral BID  . metoprolol succinate  50 mg Oral Daily  . multivitamin with minerals  1 tablet Oral Daily  . pantoprazole  40 mg Oral Daily  . potassium chloride  20 mEq Oral Daily  . pregabalin  25 mg Oral Daily  . Ensure Max Protein  11 oz Oral QHS  . sodium chloride flush  10-40 mL Intracatheter Q12H   Continuous Infusions:    LOS: 34 days   Maika Kaczmarek  [facility]  11/28/2020, 12:05 PM

## 2020-11-28 NOTE — TOC Progression Note (Signed)
Transition of Care [facility]) - Progression Note    Patient Details  Name: [name] MRN: [id number] Date of Birth: 12-20-1950  Transition of Care [facility]) CM/SW [place], LCSW Phone Number: 11/28/2020, 1:41 PM  Clinical Narrative:    CSW updated patient's spouse that patient will discharge to [facility] tomorrow. Will fax clinicals to insurance once updated PT note is in.    Expected Discharge Plan: [place] Barriers to Discharge: Continued Medical Work up  Expected Discharge Plan and Services Expected Discharge Plan: [place] In-house Referral: Clinical Social Work   Post Acute Care Choice: Hospice Living arrangements for the past 2 months: Single Family Home                                       Social Determinants of Health (SDOH) Interventions    Readmission Risk Interventions No flowsheet data found.

## 2020-11-28 NOTE — Plan of Care (Signed)
  Problem: Education: ?Goal: Knowledge of General Education information will improve ?Description: Including pain rating scale, medication(s)/side effects and non-pharmacologic comfort measures ?Outcome: Progressing ?  ?Problem: Health Behavior/Discharge Planning: ?Goal: Ability to manage health-related needs will improve ?Outcome: Progressing ?  ?Problem: Clinical Measurements: ?Goal: Ability to maintain clinical measurements within normal limits will improve ?Outcome: Progressing ?Goal: Will remain free from infection ?Outcome: Progressing ?Goal: Diagnostic test results will improve ?Outcome: Progressing ?Goal: Respiratory complications will improve ?Outcome: Progressing ?Goal: Cardiovascular complication will be avoided ?Outcome: Progressing ?  ?Problem: Activity: ?Goal: Risk for activity intolerance will decrease ?Outcome: Progressing ?  ?Problem: Nutrition: ?Goal: Adequate nutrition will be maintained ?Outcome: Progressing ?  ?Problem: Coping: ?Goal: Level of anxiety will decrease ?Outcome: Progressing ?  ?Problem: Elimination: ?Goal: Will not experience complications related to bowel motility ?Outcome: Progressing ?Goal: Will not experience complications related to urinary retention ?Outcome: Progressing ?  ?Problem: Pain Managment: ?Goal: General experience of comfort will improve ?Outcome: Progressing ?  ?Problem: Safety: ?Goal: Ability to remain free from injury will improve ?Outcome: Progressing ?  ?Problem: Skin Integrity: ?Goal: Risk for impaired skin integrity will decrease ?Outcome: Progressing ?  ?Problem: Safety: ?Goal: Non-violent Restraint(s) ?Outcome: Progressing ?  ?Problem: Education: ?Goal: Knowledge of risk factors and measures for prevention of condition will improve ?Outcome: Progressing ?  ?Problem: Coping: ?Goal: Psychosocial and spiritual needs will be supported ?Outcome: Progressing ?  ?Problem: Respiratory: ?Goal: Will maintain a patent airway ?Outcome: Progressing ?Goal:  Complications related to the disease process, condition or treatment will be avoided or minimized ?Outcome: Progressing ?  ?

## 2020-11-28 NOTE — Progress Notes (Signed)
Physical Therapy Treatment Patient Details Name: [name] MRN: [id number] DOB: [birth year] Today's Date: 11/28/2020    History of Present Illness 70 y.o. male admitted 10/25/20 with hypoxic respiratory failure from CHF exacerbation. Pt also with L foot osteomyelitis and developed sepsis. S/p L AKA on 1/31. Course complicated by hypotension. PMH includes CAD s/p CABG in 2014,  CHF, paroxysmal atrial flutter, PVD, HTN, HLD, DM2, CVA with left sided hemiparesis.   PT Comments    Pt progressing with mobility, some improvement with hypotension this session during upright mobility (see values below), although pt continues to endorse fatigue and "not feeling well." Pt requires maxA for supine<>sit transfers; tolerated prolonged seated EOB activity, reliant on RUE or external assist to maintain static and dynamic sitting balance. Continue to recommend SNF-level therapies to maximize functional mobility and independence prior to return home.  Supine BP 95/75 Sitting BP 91/63 - pt moving RUE  SpO2 98% on RA, HR 108   Follow Up Recommendations  SNF;Supervision/Assistance - 24 hour     Equipment Recommendations  Other (comment) (hoyer lift)    Recommendations for Other Services       Precautions / Restrictions Precautions Precautions: Fall;Other (comment) Precaution Comments: monitor BP; LUE hemiparesis w/ contracture Restrictions Weight Bearing Restrictions: Yes LLE Weight Bearing: Non weight bearing Other Position/Activity Restrictions: S/p L AKA 11/11/20    Mobility  Bed Mobility Overal bed mobility: Needs Assistance Bed Mobility: Supine to Sit;Sit to Supine     Supine to sit: Max assist Sit to supine: Max assist   General bed mobility comments: Pt with good use of RUE to assist bed mobility with rail support, cues to engage RLE for scooting to EOB; maxA for trunk elevation; maxA to scoot L hip to EOB once upright; repeated cues for return to supine, maxA for LE management;  once supine; able to scoot up towards [facility] with RUE/RLE support and min-modA    Transfers     Transfers: Lateral/Scoot Transfers          Lateral/Scoot Transfers: Max assist General transfer comment: Pt able to perform minimal lateral scoot towards HOB with maxA and use of bed pad, difficulty offloading hips in order to successfully scoot with RUE/RLE support; deferred OOB secondary to hypotension - bed left in chair position  Ambulation/Gait                 Stairs             Wheelchair Mobility    Modified Rankin (Stroke Patients Only)       Balance Overall balance assessment: Needs assistance Sitting-balance support: Single extremity supported;Feet supported Sitting balance-Leahy Scale: Poor Sitting balance - Comments: Initial modA progressing to min guard for sitting balance although reliant on use of RUE for support                                    Cognition Arousal/Alertness: Awake/alert Behavior During Therapy: WFL for tasks assessed/performed Overall Cognitive Status: No family/caregiver present to determine baseline cognitive functioning Area of Impairment: Attention;Memory;Following commands;Safety/judgement;Awareness;Problem solving                   Current Attention Level: Sustained;Selective Memory: Decreased short-term memory Following Commands: Follows one step commands consistently Safety/Judgement: Decreased awareness of safety;Decreased awareness of deficits Awareness: Emergent Problem Solving: Difficulty sequencing;Requires verbal cues General Comments: Pt continues with poor attention and difficulty multitasking (at one  point on phone during session and states, "Hold on, I can't do two things at once") - requires cues to redirect and stay on task. Pt asks repeated questions with decreased recall of answers previously provided.  He requires mod A for problem solving      Exercises      General Comments  General comments (skin integrity, edema, etc.): Supine BP 95/75, sitting 91/63 (pt continues to move RUE although cues to hold still for BP measurement) - pt states, "I don't feel well" while sitting EOB      Pertinent Vitals/Pain Pain Assessment: Faces Faces Pain Scale: Hurts little more Pain Location: L residual limb, abdomen (reports constipation) Pain Descriptors / Indicators: Discomfort;Guarding Pain Intervention(s): Monitored during session    Home Living                      Prior Function            PT Goals (current goals can now be found in the care plan section) Progress towards PT goals: Progressing toward goals (slowly)    Frequency    Min 2X/week      PT Plan Current plan remains appropriate    Co-evaluation              AM-PAC PT "6 Clicks" Mobility   Outcome Measure  Help needed turning from your back to your side while in a flat bed without using bedrails?: A Lot Help needed moving from lying on your back to sitting on the side of a flat bed without using bedrails?: A Lot Help needed moving to and from a bed to a chair (including a wheelchair)?: Total Help needed standing up from a chair using your arms (e.g., wheelchair or bedside chair)?: Total Help needed to walk in hospital room?: Total Help needed climbing 3-5 steps with a railing? : Total 6 Click Score: 8    End of Session   Activity Tolerance: Patient tolerated treatment well;Patient limited by fatigue Patient left: in bed;with call bell/phone within reach;with bed alarm set (chair position) Nurse Communication: Mobility status PT Visit Diagnosis: Unsteadiness on feet (R26.81);Muscle weakness (generalized) (M62.81);Difficulty in walking, not elsewhere classified (R26.2)     Time: 3832-9191 PT Time Calculation (min) (ACUTE ONLY): 18 min  Charges:  $Therapeutic Activity: 8-22 mins                    Mabeline Caras, PT, DPT Acute Rehabilitation Services  Pager  [phone] Office [place] 11/28/2020, 1:56 PM

## 2020-11-29 LAB — GLUCOSE, CAPILLARY
Glucose-Capillary: 163 mg/dL — ABNORMAL HIGH (ref 70–99)
Glucose-Capillary: 185 mg/dL — ABNORMAL HIGH (ref 70–99)

## 2020-11-29 MED ORDER — LOSARTAN POTASSIUM 25 MG PO TABS: 25.0000 mg | ORAL_TABLET | Freq: Every day | ORAL | Status: DC

## 2020-11-29 MED ORDER — GERHARDT'S BUTT CREAM: 1.0000 "application " | TOPICAL_CREAM | Freq: Four times a day (QID) | CUTANEOUS | Status: DC

## 2020-11-29 MED ORDER — ENSURE MAX PROTEIN PO LIQD: 11.0000 [oz_av] | Freq: Every day | ORAL | Status: DC

## 2020-11-29 MED ORDER — LEVALBUTEROL HCL 1.25 MG/0.5ML IN NEBU: 1.2500 mg | INHALATION_SOLUTION | Freq: Four times a day (QID) | RESPIRATORY_TRACT | 12 refills | Status: DC | PRN

## 2020-11-29 MED ORDER — MEMANTINE HCL 5 MG PO TABS: 5.0000 mg | ORAL_TABLET | Freq: Two times a day (BID) | ORAL | Status: DC

## 2020-11-29 MED ORDER — OXYCODONE HCL 10 MG PO TABS: 5.0000 mg | ORAL_TABLET | Freq: Four times a day (QID) | ORAL | 0 refills | Status: DC | PRN

## 2020-11-29 MED ORDER — METOPROLOL SUCCINATE ER 50 MG PO TB24: 50.0000 mg | ORAL_TABLET | Freq: Every day | ORAL | Status: DC

## 2020-11-29 MED ORDER — LANTUS SOLOSTAR 100 UNIT/ML ~~LOC~~ SOPN: 10.0000 [IU] | PEN_INJECTOR | Freq: Every day | SUBCUTANEOUS | 2 refills | Status: DC

## 2020-11-29 MED ORDER — INSULIN ASPART 100 UNIT/ML ~~LOC~~ SOLN: SUBCUTANEOUS | 11 refills | Status: DC

## 2020-11-29 MED ORDER — PREGABALIN 25 MG PO CAPS: 25.0000 mg | ORAL_CAPSULE | Freq: Every day | ORAL | Status: DC

## 2020-11-29 MED ORDER — DONEPEZIL HCL 5 MG PO TABS: 5.0000 mg | ORAL_TABLET | Freq: Every day | ORAL | Status: DC

## 2020-11-29 MED ORDER — GABAPENTIN 600 MG PO TABS: 300.0000 mg | ORAL_TABLET | Freq: Three times a day (TID) | ORAL | Status: DC

## 2020-11-29 MED ORDER — BACLOFEN 10 MG PO TABS: 5.0000 mg | ORAL_TABLET | Freq: Two times a day (BID) | ORAL | 0 refills | Status: DC

## 2020-11-29 NOTE — Progress Notes (Addendum)
Attempt to call report x1 to SNF [facility]. Nurse not available to take report at this time, left number for callback.   [place], nurse given report.

## 2020-11-29 NOTE — Discharge Summary (Signed)
PATIENT DETAILS Name: [name] Age: 70 y.o. Sex: male Date of Birth: 03/31/1951 MRN: [id number]. Admitting Physician: Bernadette Hoit, DO YTK:[id number], Nicole Kindred, MD  Admit Date: 10/25/2020 Discharge date: 11/29/2020  Recommendations for Outpatient Follow-up:  1. Follow up with PCP in 1-2 weeks 2. Please obtain CMP/CBC in one week 3. Possible cirrhosis seen on CT imaging-stable for outpatient follow-up by PCP or attending MD at SNF   Admitted From:  Home  Disposition: SNF   Home Health: No  Equipment/Devices: None  Discharge Condition: Stable  CODE STATUS: FULL CODE  Diet recommendation:  Diet Order            Diet - low sodium heart healthy           Diet Carb Modified           DIET DYS 2 Room service appropriate? Yes with Assist; Fluid consistency: Thin  Diet effective now                  Brief Summary: [name] is an 70 y.o. 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  Hospital course complicated by acute hypoxic respiratory failure due to decompensated systolic heart failure.  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   Significant Events: 1/14 >> admit to [facility]  1/17 >> transferred to [facility]  1/31 >> left AKA   Significant studies: 1/8>> Echo: EF 25-30% 1/17>> CT angio abdominal aorta with iliofemoral runoff: 70-80% proximal SMA stenosis, diffuse disease throughout the right SFA-with maximal stenosis approaching 50-70%, occlusion of left superficial femoral artery, severe disease of left tibiofemoral trunk 2/2>> right upper extremity duplex ultrasound: No DVT  Microbiology data: 2/1>> blood culture: Negative till  date  Procedures: 11/11/2020 above-the-knee amputation by vascular surgery   Brief Hospital Course: Sepsis due to Infected Lt Foot/AcuteOsteomyelitis: Sepsis physiology has resolved-has completed a course of antimicrobial therapy-is s/p AKA on 1/31.  Blood cultures negative to date.  COVID-19 infection: Incidental finding-we will complete isolation on 2/18.  Finished 3 days of Remdesivir.  Acute metabolic/toxic encephalopathy: Felt to be due to polypharmacy/sepsis-has resolved.  Completely awake and alert.  Patient's chronic pain medications have been adjusted-continue to optimize as much as possible.  Acute perioperative blood loss anemia: Hemoglobin stable-no indication for transfusion.  Chronic combined systolic and diastolic heart failure: Euvolemic-continue Lasix/Toprol/losartan.  Outpatient follow-up with cardiology.  Persistent atrial flutter: Rate controlled-on metoprolol amiodarone and Eliquis.  History of CAD s/p CABG: No longer on antiplatelets is on Eliquis-on beta-blocker/statin.  Acute respiratory failure with hypoxia: Occurred postoperatively-currently resolved  DM-2 (A1c 6.9 on 1/14): CBG stable-continue Lantus 10 units daily and SSI.  Follow and adjust.  History of CVA with left hemiparesis:Continue Eliquis.  Chronic kidney disease stage CL:EXNTZGYFVC at baseline.  Dementia:Continue Aricept, Namenda.  BSW:HQPRFFMB finasteride.  Probable early cirrhosis: Seen incidentally on CT angio-stable for outpatient monitoring/work-up.  Obesity: Estimated body mass index is 38.09 kg/m as calculated from the following:   Height as of this encounter: 5' 7"  (1.702 m).   Weight as of this encounter: 110.3 kg.    Discharge Diagnoses:  Principal Problem:   Sepsis --due to Infected Lt Foot/Ostepmyleitis---POA Active Problems:   Atrial flutter (HCC)   Mixed hyperlipidemia   CAD,  multiple vessel, 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   Essential hypertension   GERD  (gastroesophageal reflux disease)   BPH (benign prostatic hyperplasia)   Acute osteomyelitis of toe, left (HCC)   Diabetic ulcer of left heel (HCC)   Fracture of left toe   Leukocytosis   Hypokalemia   Hypoalbuminemia   Hyperglycemia due to diabetes mellitus ([place])   History of stroke   PAD (peripheral artery disease) /SMA Stenosis/Rt and Lt SFA Stenosis/   Osteomyelitis of left foot ([place])   Cardiomyopathy ([place])   Palliative care by specialist   Goals of care, counseling/discussion   Discharge Instructions:    Person Under Monitoring Name: [name]  Location: [place] [address]   Infection Prevention Recommendations for Individuals Confirmed to have, or Being Evaluated for, 2019 Novel Coronavirus (COVID-19) Infection Who Receive Care at Home  Individuals who are confirmed to have, or are being evaluated for, COVID-19 should follow the prevention steps below until a healthcare provider or local or state health department says they can return to normal activities.  Stay home except to get medical care You should restrict activities outside your home, except for getting medical care. Do not go to work, school, or public areas, and do not use public transportation or taxis.  Call ahead before visiting your doctor Before your medical appointment, call the healthcare provider and tell them that you have, or are being evaluated for, COVID-19 infection. This will help the healthcare provider's office take steps to keep other people from getting infected. Ask your healthcare provider to call the local or state health department.  Monitor your symptoms Seek prompt medical attention if your illness is worsening (e.g., difficulty breathing). Before going to your medical appointment, call the healthcare provider and tell them that you have, or are being evaluated for, COVID-19 infection. Ask your healthcare provider to call the local or state health  department.  Wear a facemask You should wear a facemask that covers your nose and mouth when you are in the same room with other people and when you visit a healthcare provider. People who live with or visit you should also wear a facemask while they are in the same room with you.  Separate yourself from other people in your home As much as possible, you should stay in a different room from other people in your home. Also, you should use a separate bathroom, if available.  Avoid sharing household items You should not share dishes, drinking glasses, cups, eating utensils, towels, bedding, or other items with other people in your home. After using these items, you should wash them thoroughly with soap and water.  Cover your coughs and sneezes Cover your mouth and nose with a tissue when you cough or sneeze, or you can cough or sneeze into your sleeve. Throw used tissues in a lined trash can, and immediately wash your hands with soap and water for at least 20 seconds or use an alcohol-based hand rub.  Wash your [facility] your hands often and thoroughly with soap and water for at least 20 seconds. You can use an alcohol-based hand sanitizer if soap and water are not available and if your hands are not visibly dirty. Avoid touching your eyes, nose, and mouth with unwashed hands.   Prevention Steps for Caregivers and Household Members of Individuals Confirmed to have, or Being Evaluated for, COVID-19 Infection Being Cared for in the Home  If you live with, or provide care at home for,  a person confirmed to have, or being evaluated for, COVID-19 infection please follow these guidelines to prevent infection:  Follow healthcare provider's instructions Make sure that you understand and can help the patient follow any healthcare provider instructions for all care.  Provide for the patient's basic needs You should help the patient with basic needs in the home and provide support for getting  groceries, prescriptions, and other personal needs.  Monitor the patient's symptoms If they are getting sicker, call his or her medical provider and tell them that the patient has, or is being evaluated for, COVID-19 infection. This will help the healthcare provider's office take steps to keep other people from getting infected. Ask the healthcare provider to call the local or state health department.  Limit the number of people who have contact with the patient  If possible, have only one caregiver for the patient.  Other household members should stay in another home or place of residence. If this is not possible, they should stay  in another room, or be separated from the patient as much as possible. Use a separate bathroom, if available.  Restrict visitors who do not have an essential need to be in the home.  Keep older adults, very young children, and other sick people away from the patient Keep older adults, very young children, and those who have compromised immune systems or chronic health conditions away from the patient. This includes people with chronic heart, lung, or kidney conditions, diabetes, and cancer.  Ensure good ventilation Make sure that shared spaces in the home have good air flow, such as from an air conditioner or an opened window, weather permitting.  Wash your hands often  Wash your hands often and thoroughly with soap and water for at least 20 seconds. You can use an alcohol based hand sanitizer if soap and water are not available and if your hands are not visibly dirty.  Avoid touching your eyes, nose, and mouth with unwashed hands.  Use disposable paper towels to dry your hands. If not available, use dedicated cloth towels and replace them when they become wet.  Wear a facemask and gloves  Wear a disposable facemask at all times in the room and gloves when you touch or have contact with the patient's blood, body fluids, and/or secretions or excretions,  such as sweat, saliva, sputum, nasal mucus, vomit, urine, or feces.  Ensure the mask fits over your nose and mouth tightly, and do not touch it during use.  Throw out disposable facemasks and gloves after using them. Do not reuse.  Wash your hands immediately after removing your facemask and gloves.  If your personal clothing becomes contaminated, carefully remove clothing and launder. Wash your hands after handling contaminated clothing.  Place all used disposable facemasks, gloves, and other waste in a lined container before disposing them with other household waste.  Remove gloves and wash your hands immediately after handling these items.  Do not share dishes, glasses, or other household items with the patient  Avoid sharing household items. You should not share dishes, drinking glasses, cups, eating utensils, towels, bedding, or other items with a patient who is confirmed to have, or being evaluated for, COVID-19 infection.  After the person uses these items, you should wash them thoroughly with soap and water.  Wash laundry thoroughly  Immediately remove and wash clothes or bedding that have blood, body fluids, and/or secretions or excretions, such as sweat, saliva, sputum, nasal mucus, vomit, urine, or feces, on them.  Wear gloves when handling laundry from the patient.  Read and follow directions on labels of laundry or clothing items and detergent. In general, wash and dry with the warmest temperatures recommended on the label.  Clean all areas the individual has used often  Clean all touchable surfaces, such as counters, tabletops, doorknobs, bathroom fixtures, toilets, phones, keyboards, tablets, and bedside tables, every day. Also, clean any surfaces that may have blood, body fluids, and/or secretions or excretions on them.  Wear gloves when cleaning surfaces the patient has come in contact with.  Use a diluted bleach solution (e.g., dilute bleach with 1 part bleach and 10  parts water) or a household disinfectant with a label that says EPA-registered for coronaviruses. To make a bleach solution at home, add 1 tablespoon of bleach to 1 quart (4 cups) of water. For a larger supply, add  cup of bleach to 1 gallon (16 cups) of water.  Read labels of cleaning products and follow recommendations provided on product labels. Labels contain instructions for safe and effective use of the cleaning product including precautions you should take when applying the product, such as wearing gloves or eye protection and making sure you have good ventilation during use of the product.  Remove gloves and wash hands immediately after cleaning.  Monitor yourself for signs and symptoms of illness Caregivers and household members are considered close contacts, should monitor their health, and will be asked to limit movement outside of the home to the extent possible. Follow the monitoring steps for close contacts listed on the symptom monitoring form.   ? If you have additional questions, contact your local health department or call the epidemiologist on call at [phone] (available 24/7). ? This guidance is subject to change. For the most up-to-date guidance from CDC, please refer to their website: YouBlogs.pl    Activity:  As tolerated   Discharge Instructions    Diet - low sodium heart healthy   Complete by: As directed    Diet Carb Modified   Complete by: As directed    Increase activity slowly   Complete by: As directed    No dressing needed   Complete by: As directed      Allergies as of 11/29/2020      Reactions   Flexeril [cyclobenzaprine] Shortness Of Breath   Tamsulosin Hcl Swelling, Rash, Other (See Comments)   Swelling around the mouth with rash on face Swelling around the mouth with rash on face Swelling around the mouth with rash on face Swelling around the mouth with rash on face    Tramadol Other (See Comments)   Headache   Robaxin [methocarbamol] Rash   Aspirin    On plavix   Morphine And Related Itching      Medication List    STOP taking these medications   Botox 100 units Solr injection Generic drug: botulinum toxin Type A   collagenase ointment Commonly known as: SANTYL   hydrOXYzine 25 MG tablet Commonly known as: ATARAX/VISTARIL   lisinopril 2.5 MG tablet Commonly known as: ZESTRIL   naproxen 500 MG tablet Commonly known as: NAPROSYN   oxyCODONE-acetaminophen 10-325 MG tablet Commonly known as: PERCOCET     TAKE these medications   Accu-Chek Softclix Lancets lancets USE TO TEST BLOOD SUGAR FOUR TIMES DAILY   amiodarone 200 MG tablet Commonly known as: PACERONE Take 1 tablet (200 mg total) by mouth daily.   apixaban 5 MG Tabs tablet Commonly known as: ELIQUIS Take 1 tablet (5 mg  total) by mouth 2 (two) times daily.   atorvastatin 40 MG tablet Commonly known as: LIPITOR Take 1 tablet (40 mg total) by mouth at bedtime.   baclofen 10 MG tablet Commonly known as: LIORESAL Take 0.5 tablets (5 mg total) by mouth 2 (two) times daily. What changed: when to take this   donepezil 5 MG tablet Commonly known as: ARICEPT Take 1 tablet (5 mg total) by mouth at bedtime.   Ensure Max Protein Liqd Take 330 mLs (11 oz total) by mouth at bedtime.   ferrous sulfate 325 (65 FE) MG tablet Take 325 mg by mouth 2 (two) times a day.   finasteride 5 MG tablet Commonly known as: PROSCAR Take 5 mg by mouth daily at 12 noon.   furosemide 40 MG tablet Commonly known as: LASIX Take 40 mg by mouth daily at 12 noon.   gabapentin 600 MG tablet Commonly known as: NEURONTIN Take 0.5 tablets (300 mg total) by mouth 3 (three) times daily. What changed: how much to take   Gerhardt's butt cream Crea Apply 1 application topically 4 (four) times daily.   Global Ease Inject Pen Needles 31G X 8 MM Misc Generic drug: Insulin Pen Needle USE TO INJECT  insulin into THE SKIN FOUR TIMES DAILY   glucose blood test strip Commonly known as: Accu-Chek Aviva Plus USE TO CHECK BLOOD SUGAR FOUR TIMES DAILY   insulin aspart 100 UNIT/ML injection Commonly known as: novoLOG 0-15 Units, Subcutaneous, 3 times daily with meals CBG < 70: Implement Hypoglycemia Standing Orders and refer to Hypoglycemia Standing Orders sidebar report CBG 70 - 120: 0 units CBG 121 - 150: 2 units CBG 151 - 200: 3 units CBG 201 - 250: 5 units CBG 251 - 300: 8 units CBG 301 - 350: 11 units CBG 351 - 400: 15 units CBG > 400: call MD   Lantus SoloStar 100 UNIT/ML Solostar Pen Generic drug: insulin glargine Inject 10 Units into the skin daily. What changed: See the new instructions.   levalbuterol 1.25 MG/0.5ML nebulizer solution Commonly known as: XOPENEX Take 1.25 mg by nebulization every 6 (six) hours as needed for wheezing or shortness of breath.   levocetirizine 5 MG tablet Commonly known as: XYZAL Take 5 mg by mouth at bedtime.   losartan 25 MG tablet Commonly known as: COZAAR Take 1 tablet (25 mg total) by mouth daily. Start taking on: November 30, 2020   memantine 5 MG tablet Commonly known as: NAMENDA Take 1 tablet (5 mg total) by mouth 2 (two) times daily.   metFORMIN 1000 MG tablet Commonly known as: GLUCOPHAGE TAKE 1 TABLET BY MOUTH TWICE DAILY (pt takes ONE daily) What changed: See the new instructions.   metoprolol succinate 50 MG 24 hr tablet Commonly known as: TOPROL-XL Take 1 tablet (50 mg total) by mouth daily. Take with or immediately following a meal. Start taking on: November 30, 2020   Narcan 4 MG/0.1ML Liqd nasal spray kit Generic drug: naloxone Place 4 mg into the nose as needed.   nitroGLYCERIN 0.4 MG SL tablet Commonly known as: NITROSTAT Place 1 tablet (0.4 mg total) under the tongue every 5 (five) minutes as needed for chest pain.   Oxycodone HCl 10 MG Tabs Take 0.5 tablets (5 mg total) by mouth every 6 (six) hours as  needed for up to 15 days (Pain).   pantoprazole 40 MG tablet Commonly known as: PROTONIX Take 1 tablet (40 mg total) by mouth daily.   potassium chloride SA 20  MEQ tablet Commonly known as: KLOR-CON Take 20 mEq by mouth daily at 12 noon.   pregabalin 25 MG capsule Commonly known as: LYRICA Take 1 capsule (25 mg total) by mouth daily. Start taking on: November 30, 2020   Vitamin D3 125 MCG (5000 UT) Caps Take 5,000 Units by mouth daily at 12 noon.            Discharge Care Instructions  (From admission, onward)         Start     Ordered   11/29/20 0000  No dressing needed        11/29/20 1016          Contact information for follow-up providers    Leeanne Rio, MD. Schedule an appointment as soon as possible for a visit in 1 week(s).   Specialty: Family Medicine Contact information: [place] [address] [phone]        Lelon Perla, MD Follow up in 1 month(s).   Specialty: Cardiology Contact information: [address] [place] [address] [phone]            Contact information for after-discharge care    [facility] .   Service: Skilled Nursing Contact information: [place] [phone]                 Allergies  Allergen Reactions  . Flexeril [Cyclobenzaprine] Shortness Of Breath  . Tamsulosin Hcl Swelling, Rash and Other (See Comments)    Swelling around the mouth with rash on face Swelling around the mouth with rash on face Swelling around the mouth with rash on face  Swelling around the mouth with rash on face  . Tramadol Other (See Comments)    Headache  . Robaxin [Methocarbamol] Rash  . Aspirin     On plavix  . Morphine And Related Itching     Other Procedures/Studies: DG CHEST PORT 1 VIEW  Result Date: 11/26/2020 CLINICAL DATA:  Dyspnea. EXAM: PORTABLE CHEST 1 VIEW COMPARISON:   November 19, 2020. FINDINGS: The heart size and mediastinal contours are within normal limits. Status post coronary bypass graft. No pneumothorax or pleural effusion is noted. Both lungs are clear. The visualized skeletal structures are unremarkable. IMPRESSION: No active disease. Electronically Signed   By: Marijo Conception M.D.   On: 11/26/2020 11:40   DG Chest Port 1 View  Result Date: 11/19/2020 CLINICAL DATA:  Reported COVID-19 positive EXAM: PORTABLE CHEST 1 VIEW COMPARISON:  November 12, 2020 and October 29, 2020 chest radiographs. FINDINGS: There has been clearing of airspace opacity bilaterally. Subtle interstitial thickening is noted in the bases. No consolidation. Heart is mildly enlarged, stable. Pulmonary vascularity is normal. No adenopathy. Patient is status post coronary artery bypass grafting. Bones are osteoporotic. Prior rib fractures noted on the left. IMPRESSION: Subtle interstitial thickening in the bases. This interstitial thickening may represent residua of COVID 19 pneumonitis. There may also be a degree of underlying bronchitis. No airspace consolidation noted. Stable cardiac prominence. No adenopathy evident. Bones osteoporotic. Electronically Signed   By: Lowella Grip III M.D.   On: 11/19/2020 13:35   DG Chest Port 1 View  Result Date: 11/12/2020 CLINICAL DATA:  Fever.  Sepsis. EXAM: PORTABLE CHEST 1 VIEW COMPARISON:  10/29/2020.  10/26/2020. FINDINGS: PICC line noted stable position. Prior CABG. Cardiomegaly. Bilateral interstitial infiltrates/edema again noted with interim improvement from prior exam. Tiny bilateral  pleural effusions cannot be excluded. Right costophrenic angle incompletely imaged. No pneumothorax. Deformity again noted of the proximal left humerus. Carotid and peripheral vascular calcification noted. IMPRESSION: 1. PICC line noted in stable position. 2. Prior CABG. Cardiomegaly. Bilateral interstitial infiltrates/edema again noted with interim improvement from  prior exam. Tiny bilateral pleural effusions cannot be excluded. 3. Carotid and peripheral vascular disease. Electronically Signed   By: Marcello Moores  Register   On: 11/12/2020 08:28   VAS [place] UPPER EXTREMITY VENOUS DUPLEX  Result Date: 11/13/2020 UPPER VENOUS STUDY  Indications: Swelling RT arm, PICC line Limitations: Bandaging around PICC insertion. Comparison Study: No prior studies. Performing Technologist: Darlin Coco RDMS  Examination Guidelines: A complete evaluation includes B-mode imaging, spectral Doppler, color Doppler, and power Doppler as needed of all accessible portions of each vessel. Bilateral testing is considered an integral part of a complete examination. Limited examinations for reoccurring indications may be performed as noted.  Right Findings: +----------+------------+---------+-----------+----------+---------------------+ RIGHT     CompressiblePhasicitySpontaneousProperties       Summary        +----------+------------+---------+-----------+----------+---------------------+ IJV           Full       Yes       Yes                                    +----------+------------+---------+-----------+----------+---------------------+ Subclavian    Full       Yes       Yes                                    +----------+------------+---------+-----------+----------+---------------------+ Axillary      Full       Yes       Yes                                    +----------+------------+---------+-----------+----------+---------------------+ Brachial      Full       Yes       Yes                Some segments not                                                        well visualized due                                                          to bandaging-                                                          visualized segments  patent          +----------+------------+---------+-----------+----------+---------------------+ Radial        Full                                                        +----------+------------+---------+-----------+----------+---------------------+ Ulnar         Full                                                        +----------+------------+---------+-----------+----------+---------------------+ Cephalic    Partial      Yes       Yes                Age Indeterminate   +----------+------------+---------+-----------+----------+---------------------+ Basilic                                              Not well visualized                                                        due to bandaging    +----------+------------+---------+-----------+----------+---------------------+  Left Findings: +----------+------------+---------+-----------+----------+-------+ LEFT      CompressiblePhasicitySpontaneousPropertiesSummary +----------+------------+---------+-----------+----------+-------+ Subclavian    Full       Yes       Yes                      +----------+------------+---------+-----------+----------+-------+  Summary:  Right: No evidence of deep vein thrombosis in the upper extremity. Findings consistent with age indeterminate superficial vein thrombosis involving the right cephalic vein. However, unable to visualize the basilic vein along with some segments of the brachial vein at the PICC insertion.  Left: No evidence of thrombosis in the subclavian.  *See table(s) above for measurements and observations.  Diagnosing physician: Harold Barban MD Electronically signed by Harold Barban MD on 11/13/2020 at 7:12:19 PM.    Final      TODAY-DAY OF DISCHARGE:  Subjective:   Nihaal Friesen today has no headache,no chest abdominal pain,no new weakness tingling or numbness, feels much better wants to go home today.   Objective:   Blood pressure 130/80, pulse 97, temperature 97.7 F  (36.5 C), temperature source Oral, resp. rate 13, height 5' 7"  (1.702 m), weight 110.3 kg, SpO2 97 %.  Intake/Output Summary (Last 24 hours) at 11/29/2020 1026 Last data filed at 11/29/2020 0850 Gross per 24 hour  Intake 360 ml  Output 500 ml  Net -140 ml   Filed Weights   10/25/20 1824 10/26/20 1850 10/27/20 0500  Weight: 116.1 kg 110.5 kg 110.3 kg    Exam: Awake Alert, Oriented *3, No new F.N deficits, Normal affect [place].AT,PERRAL Supple Neck,No JVD, No cervical lymphadenopathy appriciated.  Symmetrical Chest wall movement, Good air movement bilaterally, CTAB RRR,No Gallops,Rubs or new Murmurs, No Parasternal Heave +ve B.Sounds, Abd Soft, Non tender, No organomegaly appriciated, No rebound -guarding or rigidity.  No Cyanosis, Clubbing or edema, No new Rash or bruise   PERTINENT RADIOLOGIC STUDIES: DG CHEST PORT 1 VIEW  Result Date: 11/26/2020 CLINICAL DATA:  Dyspnea. EXAM: PORTABLE CHEST 1 VIEW COMPARISON:  November 19, 2020. FINDINGS: The heart size and mediastinal contours are within normal limits. Status post coronary bypass graft. No pneumothorax or pleural effusion is noted. Both lungs are clear. The visualized skeletal structures are unremarkable. IMPRESSION: No active disease. Electronically Signed   By: Marijo Conception M.D.   On: 11/26/2020 11:40   DG Chest Port 1 View  Result Date: 11/19/2020 CLINICAL DATA:  Reported COVID-19 positive EXAM: PORTABLE CHEST 1 VIEW COMPARISON:  November 12, 2020 and October 29, 2020 chest radiographs. FINDINGS: There has been clearing of airspace opacity bilaterally. Subtle interstitial thickening is noted in the bases. No consolidation. Heart is mildly enlarged, stable. Pulmonary vascularity is normal. No adenopathy. Patient is status post coronary artery bypass grafting. Bones are osteoporotic. Prior rib fractures noted on the left. IMPRESSION: Subtle interstitial thickening in the bases. This interstitial thickening may represent residua of COVID  19 pneumonitis. There may also be a degree of underlying bronchitis. No airspace consolidation noted. Stable cardiac prominence. No adenopathy evident. Bones osteoporotic. Electronically Signed   By: Lowella Grip III M.D.   On: 11/19/2020 13:35   DG Chest Port 1 View  Result Date: 11/12/2020 CLINICAL DATA:  Fever.  Sepsis. EXAM: PORTABLE CHEST 1 VIEW COMPARISON:  10/29/2020.  10/26/2020. FINDINGS: PICC line noted stable position. Prior CABG. Cardiomegaly. Bilateral interstitial infiltrates/edema again noted with interim improvement from prior exam. Tiny bilateral pleural effusions cannot be excluded. Right costophrenic angle incompletely imaged. No pneumothorax. Deformity again noted of the proximal left humerus. Carotid and peripheral vascular calcification noted. IMPRESSION: 1. PICC line noted in stable position. 2. Prior CABG. Cardiomegaly. Bilateral interstitial infiltrates/edema again noted with interim improvement from prior exam. Tiny bilateral pleural effusions cannot be excluded. 3. Carotid and peripheral vascular disease. Electronically Signed   By: Marcello Moores  Register   On: 11/12/2020 08:28   VAS [place] UPPER EXTREMITY VENOUS DUPLEX  Result Date: 11/13/2020 UPPER VENOUS STUDY  Indications: Swelling RT arm, PICC line Limitations: Bandaging around PICC insertion. Comparison Study: No prior studies. Performing Technologist: Darlin Coco RDMS  Examination Guidelines: A complete evaluation includes B-mode imaging, spectral Doppler, color Doppler, and power Doppler as needed of all accessible portions of each vessel. Bilateral testing is considered an integral part of a complete examination. Limited examinations for reoccurring indications may be performed as noted.  Right Findings: +----------+------------+---------+-----------+----------+---------------------+ RIGHT     CompressiblePhasicitySpontaneousProperties       Summary         +----------+------------+---------+-----------+----------+---------------------+ IJV           Full       Yes       Yes                                    +----------+------------+---------+-----------+----------+---------------------+ Subclavian    Full       Yes       Yes                                    +----------+------------+---------+-----------+----------+---------------------+ Axillary      Full       Yes       Yes                                    +----------+------------+---------+-----------+----------+---------------------+  Brachial      Full       Yes       Yes                Some segments not                                                        well visualized due                                                          to bandaging-                                                          visualized segments                                                             patent         +----------+------------+---------+-----------+----------+---------------------+ Radial        Full                                                        +----------+------------+---------+-----------+----------+---------------------+ Ulnar         Full                                                        +----------+------------+---------+-----------+----------+---------------------+ Cephalic    Partial      Yes       Yes                Age Indeterminate   +----------+------------+---------+-----------+----------+---------------------+ Basilic                                              Not well visualized                                                        due to bandaging    +----------+------------+---------+-----------+----------+---------------------+  Left Findings: +----------+------------+---------+-----------+----------+-------+ LEFT      CompressiblePhasicitySpontaneousPropertiesSummary  +----------+------------+---------+-----------+----------+-------+ Subclavian    Full       Yes       Yes                      +----------+------------+---------+-----------+----------+-------+  Summary:  Right: No evidence of deep vein thrombosis in the upper extremity. Findings consistent with age indeterminate superficial vein thrombosis involving the right cephalic vein. However, unable to visualize the basilic vein along with some segments of the brachial vein at the PICC insertion.  Left: No evidence of thrombosis in the subclavian.  *See table(s) above for measurements and observations.  Diagnosing physician: Harold Barban MD Electronically signed by Harold Barban MD on 11/13/2020 at 7:12:19 PM.    Final      PERTINENT LAB RESULTS: CBC: No results for input(s): WBC, HGB, HCT, PLT in the last 72 hours. CMET CMP     Component Value Date/Time   NA 135 11/27/2020 0138   NA 139 05/31/2020 0915   K 4.3 11/27/2020 0138   CL 103 11/27/2020 0138   CO2 23 11/27/2020 0138   GLUCOSE 181 (H) 11/27/2020 0138   BUN 16 11/27/2020 0138   BUN 12 05/31/2020 0915   CREATININE 0.88 11/27/2020 0138   CREATININE 0.97 01/06/2019 1527   CALCIUM 8.2 (L) 11/27/2020 0138   PROT 7.0 11/20/2020 0122   PROT 6.8 05/31/2020 0915   ALBUMIN 2.2 (L) 11/20/2020 0122   ALBUMIN 3.9 05/31/2020 0915   AST 51 (H) 11/20/2020 0122   ALT 68 (H) 11/20/2020 0122   ALKPHOS 120 11/20/2020 0122   BILITOT 1.0 11/20/2020 0122   BILITOT 0.3 05/31/2020 0915   GFRNONAA >60 11/27/2020 0138   GFRNONAA 81 01/06/2019 1527   GFRAA 88 05/31/2020 0915   GFRAA 94 01/06/2019 1527    GFR Estimated Creatinine Clearance: 95.2 mL/min (by C-G formula based on SCr of 0.88 mg/dL). No results for input(s): LIPASE, AMYLASE in the last 72 hours. No results for input(s): CKTOTAL, CKMB, CKMBINDEX, TROPONINI in the last 72 hours. Invalid input(s): POCBNP No results for input(s): DDIMER in the last 72 hours. No results for input(s):  HGBA1C in the last 72 hours. No results for input(s): CHOL, HDL, LDLCALC, TRIG, CHOLHDL, LDLDIRECT in the last 72 hours. No results for input(s): TSH, T4TOTAL, T3FREE, THYROIDAB in the last 72 hours.  Invalid input(s): FREET3 No results for input(s): VITAMINB12, FOLATE, FERRITIN, TIBC, IRON, RETICCTPCT in the last 72 hours. Coags: No results for input(s): INR in the last 72 hours.  Invalid input(s): PT Microbiology: Recent Results (from the past 240 hour(s))  Resp Panel by RT-PCR (Flu A&B, Covid) Nasopharyngeal Swab     Status: Abnormal   Collection Time: 11/19/20  4:11 PM   Specimen: Nasopharyngeal Swab; Nasopharyngeal(NP) swabs in vial transport medium  Result Value Ref Range Status   SARS Coronavirus 2 by RT PCR POSITIVE (A) NEGATIVE Final    Comment: RESULT CALLED TO, READ BACK BY AND VERIFIED WITH: Gordy Clement RN [id number] 11/19/20 A BROWNING (NOTE) SARS-CoV-2 target nucleic acids are DETECTED.  The SARS-CoV-2 RNA is generally detectable in upper respiratory specimens during the acute phase of infection. Positive results are indicative of the presence of the identified virus, but do not rule out bacterial infection or co-infection with other pathogens not detected by the test. Clinical correlation with patient history and other diagnostic information is necessary to determine patient infection status. The expected result is Negative.  Fact Sheet for Patients: EntrepreneurPulse.com.au  Fact Sheet for Healthcare Providers: IncredibleEmployment.be  This test is not yet approved or cleared by the [place] FDA and  has been authorized for detection and/or diagnosis of SARS-CoV-2 by FDA under an Emergency Use Authorization (EUA).  This EUA will remain in effect (meaning this  test can b e used) for the duration of  the COVID-19 declaration under Section 564(b)(1) of the Act, 21 U.S.C. section 360bbb-3(b)(1), unless the authorization is terminated  or revoked sooner.     Influenza A by PCR NEGATIVE NEGATIVE Final   Influenza B by PCR NEGATIVE NEGATIVE Final    Comment: (NOTE) The Xpert Xpress SARS-CoV-2/FLU/RSV plus assay is intended as an aid in the diagnosis of influenza from Nasopharyngeal swab specimens and should not be used as a sole basis for treatment. Nasal washings and aspirates are unacceptable for Xpert Xpress SARS-CoV-2/FLU/RSV testing.  Fact Sheet for Patients: EntrepreneurPulse.com.au  Fact Sheet for Healthcare Providers: IncredibleEmployment.be  This test is not yet approved or cleared by the [place] FDA and has been authorized for detection and/or diagnosis of SARS-CoV-2 by FDA under an Emergency Use Authorization (EUA). This EUA will remain in effect (meaning this test can be used) for the duration of the COVID-19 declaration under Section 564(b)(1) of the Act, 21 U.S.C. section 360bbb-3(b)(1), unless the authorization is terminated or revoked.  Performed at [facility], [place] [address] [place], [place] [address]     FURTHER DISCHARGE INSTRUCTIONS:  Get Medicines reviewed and adjusted: Please take all your medications with you for your next visit with your Primary MD  Laboratory/radiological data: Please request your Primary MD to go over all hospital tests and procedure/radiological results at the follow up, please ask your Primary MD to get all Hospital records sent to his/her office.  In some cases, they will be blood work, cultures and biopsy results pending at the time of your discharge. Please request that your primary care M.D. goes through all the records of your hospital data and follows up on these results.  Also Note the following: If you experience worsening of your admission symptoms, develop shortness of breath, life threatening emergency, suicidal or homicidal thoughts you must seek medical attention immediately by calling 911 or calling  your MD immediately  if symptoms less severe.  You must read complete instructions/literature along with all the possible adverse reactions/side effects for all the Medicines you take and that have been prescribed to you. Take any new Medicines after you have completely understood and accpet all the possible adverse reactions/side effects.   Do not drive when taking Pain medications or sleeping medications (Benzodaizepines)  Do not take more than prescribed Pain, Sleep and Anxiety Medications. It is not advisable to combine anxiety,sleep and pain medications without talking with your primary care practitioner  Special Instructions: If you have smoked or chewed Tobacco  in the last 2 yrs please stop smoking, stop any regular Alcohol  and or any Recreational drug use.  Wear Seat belts while driving.  Please note: You were cared for by a hospitalist during your hospital stay. Once you are discharged, your primary care physician will handle any further medical issues. Please note that NO REFILLS for any discharge medications will be authorized once you are discharged, as it is imperative that you return to your primary care physician (or establish a relationship with a primary care physician if you do not have one) for your post hospital discharge needs so that they can reassess your need for medications and monitor your lab values.  Total Time spent coordinating discharge including counseling, education and face to face time equals 35 minutes.  SignedOren Binet 11/29/2020 10:26 AM

## 2020-11-29 NOTE — Care Management Important Message (Signed)
Important Message  Patient Details  Name: [name] MRN: [id number] Date of Birth: 1951-04-30   Medicare Important Message Given:  Yes - Important Message mailed due to current National Emergency   Verbal consent obtained due to current National Emergency  Relationship to patient: Self Contact Name: Graylin Sperling Call Date: 11/29/20  Time: 1353 Phone: [phone] Outcome: No Answer/Busy Important Message mailed to: Patient address on file    [name] 11/29/2020, 1:53 PM

## 2020-11-29 NOTE — Progress Notes (Signed)
PATIENT DETAILS Name: [name] Age: 70 y.o. Sex: male Date of Birth: 23-Apr-1951 MRN: [id number]. Admitting Physician: Bernadette Hoit, DO [id number], Nicole Kindred, MD  Admit Date: 10/25/2020 Discharge date: 11/29/2020  Recommendations for Outpatient Follow-up:  1. Follow up with PCP in 1-2 weeks 2. Please obtain CMP/CBC in one week 3. Possible cirrhosis seen on CT imaging-stable for outpatient follow-up by PCP or attending MD at SNF   Admitted From:  Home  Disposition: SNF   Home Health: No  Equipment/Devices: None  Discharge Condition: Stable  CODE STATUS: FULL CODE  Diet recommendation:  Diet Order            Diet - low sodium heart healthy           Diet Carb Modified           DIET DYS 2 Room service appropriate? Yes with Assist; Fluid consistency: Thin  Diet effective now                  Brief Summary: [name] is an 70 y.o. 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  Hospital course complicated by acute hypoxic respiratory failure due to decompensated systolic heart failure.  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   Significant Events: 1/14 >> admit to [facility]  1/17 >> transferred to [facility]  1/31 >> left AKA   Significant studies: 1/8>> Echo: EF 25-30% 1/17>> CT angio abdominal aorta with iliofemoral runoff: 70-80% proximal SMA stenosis, diffuse disease throughout the right SFA-with maximal stenosis approaching 50-70%, occlusion of left superficial femoral artery, severe disease of left tibiofemoral trunk 2/2>> right upper extremity duplex ultrasound: No DVT  Microbiology data: 2/1>> blood culture: Negative till  date  Procedures: 11/11/2020 above-the-knee amputation by vascular surgery   Brief Hospital Course: Sepsis due to Infected Lt Foot/AcuteOsteomyelitis: Sepsis physiology has resolved-has completed a course of antimicrobial therapy-is s/p AKA on 1/31.  Blood cultures negative to date.  COVID-19 infection: Incidental finding-we will complete isolation on 2/18.  Finished 3 days of Remdesivir.  Acute metabolic/toxic encephalopathy: Felt to be due to polypharmacy/sepsis-has resolved.  Completely awake and alert.  Patient's chronic pain medications have been adjusted-continue to optimize as much as possible.  Acute perioperative blood loss anemia: Hemoglobin stable-no indication for transfusion.  Chronic combined systolic and diastolic heart failure: Euvolemic-continue Lasix/Toprol/losartan.  Outpatient follow-up with cardiology.  Persistent atrial flutter: Rate controlled-on metoprolol amiodarone and Eliquis.  History of CAD s/p CABG: No longer on antiplatelets is on Eliquis-on beta-blocker/statin.  Acute respiratory failure with hypoxia: Occurred postoperatively-currently resolved  DM-2 (A1c 6.9 on 1/14): CBG stable-continue Lantus 10 units daily and SSI.  Follow and adjust.  History of CVA with left hemiparesis:Continue Eliquis.  Chronic kidney disease stage TU:[id number] at baseline.  Dementia:Continue Aricept, Namenda.  PHX:TAVWPVXY finasteride.  Probable early cirrhosis: Seen incidentally on CT angio-stable for outpatient monitoring/work-up.  Obesity: Estimated body mass index is 38.09 kg/m as calculated from the following:   Height as of this encounter: 5' 7"  (1.702 m).   Weight as of this encounter: 110.3 kg.    Discharge Diagnoses:  Principal Problem:   Sepsis --due to Infected Lt Foot/Ostepmyleitis---POA Active Problems:   Atrial flutter (HCC)   Mixed hyperlipidemia   CAD,  multiple vessel, 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   Essential hypertension   GERD  (gastroesophageal reflux disease)   BPH (benign prostatic hyperplasia)   Acute osteomyelitis of toe, left (HCC)   Diabetic ulcer of left heel (HCC)   Fracture of left toe   Leukocytosis   Hypokalemia   Hypoalbuminemia   Hyperglycemia due to diabetes mellitus ([place])   History of stroke   PAD (peripheral artery disease) /SMA Stenosis/Rt and Lt SFA Stenosis/   Osteomyelitis of left foot ([place])   Cardiomyopathy ([place])   Palliative care by specialist   Goals of care, counseling/discussion   Discharge Instructions:    Person Under Monitoring Name: [name]  Location: [place] [address]   Infection Prevention Recommendations for Individuals Confirmed to have, or Being Evaluated for, 2019 Novel Coronavirus (COVID-19) Infection Who Receive Care at Home  Individuals who are confirmed to have, or are being evaluated for, COVID-19 should follow the prevention steps below until a healthcare provider or local or state health department says they can return to normal activities.  Stay home except to get medical care You should restrict activities outside your home, except for getting medical care. Do not go to work, school, or public areas, and do not use public transportation or taxis.  Call ahead before visiting your doctor Before your medical appointment, call the healthcare provider and tell them that you have, or are being evaluated for, COVID-19 infection. This will help the healthcare provider's office take steps to keep other people from getting infected. Ask your healthcare provider to call the local or state health department.  Monitor your symptoms Seek prompt medical attention if your illness is worsening (e.g., difficulty breathing). Before going to your medical appointment, call the healthcare provider and tell them that you have, or are being evaluated for, COVID-19 infection. Ask your healthcare provider to call the local or state health  department.  Wear a facemask You should wear a facemask that covers your nose and mouth when you are in the same room with other people and when you visit a healthcare provider. People who live with or visit you should also wear a facemask while they are in the same room with you.  Separate yourself from other people in your home As much as possible, you should stay in a different room from other people in your home. Also, you should use a separate bathroom, if available.  Avoid sharing household items You should not share dishes, drinking glasses, cups, eating utensils, towels, bedding, or other items with other people in your home. After using these items, you should wash them thoroughly with soap and water.  Cover your coughs and sneezes Cover your mouth and nose with a tissue when you cough or sneeze, or you can cough or sneeze into your sleeve. Throw used tissues in a lined trash can, and immediately wash your hands with soap and water for at least 20 seconds or use an alcohol-based hand rub.  Wash your [facility] your hands often and thoroughly with soap and water for at least 20 seconds. You can use an alcohol-based hand sanitizer if soap and water are not available and if your hands are not visibly dirty. Avoid touching your eyes, nose, and mouth with unwashed hands.   Prevention Steps for Caregivers and Household Members of Individuals Confirmed to have, or Being Evaluated for, COVID-19 Infection Being Cared for in the Home  If you live with, or provide care at home for,  a person confirmed to have, or being evaluated for, COVID-19 infection please follow these guidelines to prevent infection:  Follow healthcare provider's instructions Make sure that you understand and can help the patient follow any healthcare provider instructions for all care.  Provide for the patient's basic needs You should help the patient with basic needs in the home and provide support for getting  groceries, prescriptions, and other personal needs.  Monitor the patient's symptoms If they are getting sicker, call his or her medical provider and tell them that the patient has, or is being evaluated for, COVID-19 infection. This will help the healthcare provider's office take steps to keep other people from getting infected. Ask the healthcare provider to call the local or state health department.  Limit the number of people who have contact with the patient  If possible, have only one caregiver for the patient.  Other household members should stay in another home or place of residence. If this is not possible, they should stay  in another room, or be separated from the patient as much as possible. Use a separate bathroom, if available.  Restrict visitors who do not have an essential need to be in the home.  Keep older adults, very young children, and other sick people away from the patient Keep older adults, very young children, and those who have compromised immune systems or chronic health conditions away from the patient. This includes people with chronic heart, lung, or kidney conditions, diabetes, and cancer.  Ensure good ventilation Make sure that shared spaces in the home have good air flow, such as from an air conditioner or an opened window, weather permitting.  Wash your hands often  Wash your hands often and thoroughly with soap and water for at least 20 seconds. You can use an alcohol based hand sanitizer if soap and water are not available and if your hands are not visibly dirty.  Avoid touching your eyes, nose, and mouth with unwashed hands.  Use disposable paper towels to dry your hands. If not available, use dedicated cloth towels and replace them when they become wet.  Wear a facemask and gloves  Wear a disposable facemask at all times in the room and gloves when you touch or have contact with the patient's blood, body fluids, and/or secretions or excretions,  such as sweat, saliva, sputum, nasal mucus, vomit, urine, or feces.  Ensure the mask fits over your nose and mouth tightly, and do not touch it during use.  Throw out disposable facemasks and gloves after using them. Do not reuse.  Wash your hands immediately after removing your facemask and gloves.  If your personal clothing becomes contaminated, carefully remove clothing and launder. Wash your hands after handling contaminated clothing.  Place all used disposable facemasks, gloves, and other waste in a lined container before disposing them with other household waste.  Remove gloves and wash your hands immediately after handling these items.  Do not share dishes, glasses, or other household items with the patient  Avoid sharing household items. You should not share dishes, drinking glasses, cups, eating utensils, towels, bedding, or other items with a patient who is confirmed to have, or being evaluated for, COVID-19 infection.  After the person uses these items, you should wash them thoroughly with soap and water.  Wash laundry thoroughly  Immediately remove and wash clothes or bedding that have blood, body fluids, and/or secretions or excretions, such as sweat, saliva, sputum, nasal mucus, vomit, urine, or feces, on them.  Wear gloves when handling laundry from the patient.  Read and follow directions on labels of laundry or clothing items and detergent. In general, wash and dry with the warmest temperatures recommended on the label.  Clean all areas the individual has used often  Clean all touchable surfaces, such as counters, tabletops, doorknobs, bathroom fixtures, toilets, phones, keyboards, tablets, and bedside tables, every day. Also, clean any surfaces that may have blood, body fluids, and/or secretions or excretions on them.  Wear gloves when cleaning surfaces the patient has come in contact with.  Use a diluted bleach solution (e.g., dilute bleach with 1 part bleach and 10  parts water) or a household disinfectant with a label that says EPA-registered for coronaviruses. To make a bleach solution at home, add 1 tablespoon of bleach to 1 quart (4 cups) of water. For a larger supply, add  cup of bleach to 1 gallon (16 cups) of water.  Read labels of cleaning products and follow recommendations provided on product labels. Labels contain instructions for safe and effective use of the cleaning product including precautions you should take when applying the product, such as wearing gloves or eye protection and making sure you have good ventilation during use of the product.  Remove gloves and wash hands immediately after cleaning.  Monitor yourself for signs and symptoms of illness Caregivers and household members are considered close contacts, should monitor their health, and will be asked to limit movement outside of the home to the extent possible. Follow the monitoring steps for close contacts listed on the symptom monitoring form.   ? If you have additional questions, contact your local health department or call the epidemiologist on call at [phone] (available 24/7). ? This guidance is subject to change. For the most up-to-date guidance from CDC, please refer to their website: YouBlogs.pl    Activity:  As tolerated   Discharge Instructions    Diet - low sodium heart healthy   Complete by: As directed    Diet Carb Modified   Complete by: As directed    Increase activity slowly   Complete by: As directed    No dressing needed   Complete by: As directed      Allergies as of 11/29/2020      Reactions   Flexeril [cyclobenzaprine] Shortness Of Breath   Tamsulosin Hcl Swelling, Rash, Other (See Comments)   Swelling around the mouth with rash on face Swelling around the mouth with rash on face Swelling around the mouth with rash on face Swelling around the mouth with rash on face    Tramadol Other (See Comments)   Headache   Robaxin [methocarbamol] Rash   Aspirin    On plavix   Morphine And Related Itching      Medication List    STOP taking these medications   Botox 100 units Solr injection Generic drug: botulinum toxin Type A   collagenase ointment Commonly known as: SANTYL   hydrOXYzine 25 MG tablet Commonly known as: ATARAX/VISTARIL   lisinopril 2.5 MG tablet Commonly known as: ZESTRIL   naproxen 500 MG tablet Commonly known as: NAPROSYN   oxyCODONE-acetaminophen 10-325 MG tablet Commonly known as: PERCOCET     TAKE these medications   Accu-Chek Softclix Lancets lancets USE TO TEST BLOOD SUGAR FOUR TIMES DAILY   amiodarone 200 MG tablet Commonly known as: PACERONE Take 1 tablet (200 mg total) by mouth daily.   apixaban 5 MG Tabs tablet Commonly known as: ELIQUIS Take 1 tablet (5 mg  total) by mouth 2 (two) times daily.   atorvastatin 40 MG tablet Commonly known as: LIPITOR Take 1 tablet (40 mg total) by mouth at bedtime.   baclofen 10 MG tablet Commonly known as: LIORESAL Take 0.5 tablets (5 mg total) by mouth 2 (two) times daily. What changed: when to take this   donepezil 5 MG tablet Commonly known as: ARICEPT Take 1 tablet (5 mg total) by mouth at bedtime.   Ensure Max Protein Liqd Take 330 mLs (11 oz total) by mouth at bedtime.   ferrous sulfate 325 (65 FE) MG tablet Take 325 mg by mouth 2 (two) times a day.   finasteride 5 MG tablet Commonly known as: PROSCAR Take 5 mg by mouth daily at 12 noon.   furosemide 40 MG tablet Commonly known as: LASIX Take 40 mg by mouth daily at 12 noon.   gabapentin 600 MG tablet Commonly known as: NEURONTIN Take 0.5 tablets (300 mg total) by mouth 3 (three) times daily. What changed: how much to take   Gerhardt's butt cream Crea Apply 1 application topically 4 (four) times daily.   Global Ease Inject Pen Needles 31G X 8 MM Misc Generic drug: Insulin Pen Needle USE TO INJECT  insulin into THE SKIN FOUR TIMES DAILY   glucose blood test strip Commonly known as: Accu-Chek Aviva Plus USE TO CHECK BLOOD SUGAR FOUR TIMES DAILY   insulin aspart 100 UNIT/ML injection Commonly known as: novoLOG 0-15 Units, Subcutaneous, 3 times daily with meals CBG < 70: Implement Hypoglycemia Standing Orders and refer to Hypoglycemia Standing Orders sidebar report CBG 70 - 120: 0 units CBG 121 - 150: 2 units CBG 151 - 200: 3 units CBG 201 - 250: 5 units CBG 251 - 300: 8 units CBG 301 - 350: 11 units CBG 351 - 400: 15 units CBG > 400: call MD   Lantus SoloStar 100 UNIT/ML Solostar Pen Generic drug: insulin glargine Inject 10 Units into the skin daily. What changed: See the new instructions.   levalbuterol 1.25 MG/0.5ML nebulizer solution Commonly known as: XOPENEX Take 1.25 mg by nebulization every 6 (six) hours as needed for wheezing or shortness of breath.   levocetirizine 5 MG tablet Commonly known as: XYZAL Take 5 mg by mouth at bedtime.   losartan 25 MG tablet Commonly known as: COZAAR Take 1 tablet (25 mg total) by mouth daily. Start taking on: November 30, 2020   memantine 5 MG tablet Commonly known as: NAMENDA Take 1 tablet (5 mg total) by mouth 2 (two) times daily.   metFORMIN 1000 MG tablet Commonly known as: GLUCOPHAGE TAKE 1 TABLET BY MOUTH TWICE DAILY (pt takes ONE daily) What changed: See the new instructions.   metoprolol succinate 50 MG 24 hr tablet Commonly known as: TOPROL-XL Take 1 tablet (50 mg total) by mouth daily. Take with or immediately following a meal. Start taking on: November 30, 2020   Narcan 4 MG/0.1ML Liqd nasal spray kit Generic drug: naloxone Place 4 mg into the nose as needed.   nitroGLYCERIN 0.4 MG SL tablet Commonly known as: NITROSTAT Place 1 tablet (0.4 mg total) under the tongue every 5 (five) minutes as needed for chest pain.   Oxycodone HCl 10 MG Tabs Take 0.5 tablets (5 mg total) by mouth every 6 (six) hours as  needed for up to 15 days (Pain).   pantoprazole 40 MG tablet Commonly known as: PROTONIX Take 1 tablet (40 mg total) by mouth daily.   potassium chloride SA 20  MEQ tablet Commonly known as: KLOR-CON Take 20 mEq by mouth daily at 12 noon.   pregabalin 25 MG capsule Commonly known as: LYRICA Take 1 capsule (25 mg total) by mouth daily. Start taking on: November 30, 2020   Vitamin D3 125 MCG (5000 UT) Caps Take 5,000 Units by mouth daily at 12 noon.            Discharge Care Instructions  (From admission, onward)         Start     Ordered   11/29/20 0000  No dressing needed        11/29/20 1016          Contact information for follow-up providers    Leeanne Rio, MD. Schedule an appointment as soon as possible for a visit in 1 week(s).   Specialty: Family Medicine Contact information: [place] [address] [phone]        Lelon Perla, MD Follow up in 1 month(s).   Specialty: Cardiology Contact information: [address] [place] [address] [phone]            Contact information for after-discharge care    [facility] .   Service: Skilled Nursing Contact information: [place] [phone]                 Allergies  Allergen Reactions  . Flexeril [Cyclobenzaprine] Shortness Of Breath  . Tamsulosin Hcl Swelling, Rash and Other (See Comments)    Swelling around the mouth with rash on face Swelling around the mouth with rash on face Swelling around the mouth with rash on face  Swelling around the mouth with rash on face  . Tramadol Other (See Comments)    Headache  . Robaxin [Methocarbamol] Rash  . Aspirin     On plavix  . Morphine And Related Itching     Other Procedures/Studies: DG CHEST PORT 1 VIEW  Result Date: 11/26/2020 CLINICAL DATA:  Dyspnea. EXAM: PORTABLE CHEST 1 VIEW COMPARISON:   November 19, 2020. FINDINGS: The heart size and mediastinal contours are within normal limits. Status post coronary bypass graft. No pneumothorax or pleural effusion is noted. Both lungs are clear. The visualized skeletal structures are unremarkable. IMPRESSION: No active disease. Electronically Signed   By: Marijo Conception M.D.   On: 11/26/2020 11:40   DG Chest Port 1 View  Result Date: 11/19/2020 CLINICAL DATA:  Reported COVID-19 positive EXAM: PORTABLE CHEST 1 VIEW COMPARISON:  November 12, 2020 and October 29, 2020 chest radiographs. FINDINGS: There has been clearing of airspace opacity bilaterally. Subtle interstitial thickening is noted in the bases. No consolidation. Heart is mildly enlarged, stable. Pulmonary vascularity is normal. No adenopathy. Patient is status post coronary artery bypass grafting. Bones are osteoporotic. Prior rib fractures noted on the left. IMPRESSION: Subtle interstitial thickening in the bases. This interstitial thickening may represent residua of COVID 19 pneumonitis. There may also be a degree of underlying bronchitis. No airspace consolidation noted. Stable cardiac prominence. No adenopathy evident. Bones osteoporotic. Electronically Signed   By: Lowella Grip III M.D.   On: 11/19/2020 13:35   DG Chest Port 1 View  Result Date: 11/12/2020 CLINICAL DATA:  Fever.  Sepsis. EXAM: PORTABLE CHEST 1 VIEW COMPARISON:  10/29/2020.  10/26/2020. FINDINGS: PICC line noted stable position. Prior CABG. Cardiomegaly. Bilateral interstitial infiltrates/edema again noted with interim improvement from prior exam. Tiny bilateral  pleural effusions cannot be excluded. Right costophrenic angle incompletely imaged. No pneumothorax. Deformity again noted of the proximal left humerus. Carotid and peripheral vascular calcification noted. IMPRESSION: 1. PICC line noted in stable position. 2. Prior CABG. Cardiomegaly. Bilateral interstitial infiltrates/edema again noted with interim improvement from  prior exam. Tiny bilateral pleural effusions cannot be excluded. 3. Carotid and peripheral vascular disease. Electronically Signed   By: Marcello Moores  Register   On: 11/12/2020 08:28   VAS [place] UPPER EXTREMITY VENOUS DUPLEX  Result Date: 11/13/2020 UPPER VENOUS STUDY  Indications: Swelling RT arm, PICC line Limitations: Bandaging around PICC insertion. Comparison Study: No prior studies. Performing Technologist: Darlin Coco RDMS  Examination Guidelines: A complete evaluation includes B-mode imaging, spectral Doppler, color Doppler, and power Doppler as needed of all accessible portions of each vessel. Bilateral testing is considered an integral part of a complete examination. Limited examinations for reoccurring indications may be performed as noted.  Right Findings: +----------+------------+---------+-----------+----------+---------------------+ RIGHT     CompressiblePhasicitySpontaneousProperties       Summary        +----------+------------+---------+-----------+----------+---------------------+ IJV           Full       Yes       Yes                                    +----------+------------+---------+-----------+----------+---------------------+ Subclavian    Full       Yes       Yes                                    +----------+------------+---------+-----------+----------+---------------------+ Axillary      Full       Yes       Yes                                    +----------+------------+---------+-----------+----------+---------------------+ Brachial      Full       Yes       Yes                Some segments not                                                        well visualized due                                                          to bandaging-                                                          visualized segments  patent          +----------+------------+---------+-----------+----------+---------------------+ Radial        Full                                                        +----------+------------+---------+-----------+----------+---------------------+ Ulnar         Full                                                        +----------+------------+---------+-----------+----------+---------------------+ Cephalic    Partial      Yes       Yes                Age Indeterminate   +----------+------------+---------+-----------+----------+---------------------+ Basilic                                              Not well visualized                                                        due to bandaging    +----------+------------+---------+-----------+----------+---------------------+  Left Findings: +----------+------------+---------+-----------+----------+-------+ LEFT      CompressiblePhasicitySpontaneousPropertiesSummary +----------+------------+---------+-----------+----------+-------+ Subclavian    Full       Yes       Yes                      +----------+------------+---------+-----------+----------+-------+  Summary:  Right: No evidence of deep vein thrombosis in the upper extremity. Findings consistent with age indeterminate superficial vein thrombosis involving the right cephalic vein. However, unable to visualize the basilic vein along with some segments of the brachial vein at the PICC insertion.  Left: No evidence of thrombosis in the subclavian.  *See table(s) above for measurements and observations.  Diagnosing physician: Harold Barban MD Electronically signed by Harold Barban MD on 11/13/2020 at 7:12:19 PM.    Final      TODAY-DAY OF DISCHARGE:  Subjective:   [name] today has no headache,no chest abdominal pain,no new weakness tingling or numbness, feels much better wants to go home today.   Objective:   Blood pressure 130/80, pulse 97, temperature 97.7 F  (36.5 C), temperature source Oral, resp. rate 13, height 5' 7"  (1.702 m), weight 110.3 kg, SpO2 97 %.  Intake/Output Summary (Last 24 hours) at 11/29/2020 1023 Last data filed at 11/29/2020 0850 Gross per 24 hour  Intake 360 ml  Output 500 ml  Net -140 ml   Filed Weights   10/25/20 1824 10/26/20 1850 10/27/20 0500  Weight: 116.1 kg 110.5 kg 110.3 kg    Exam: Awake Alert, Oriented *3, No new F.N deficits, Normal affect [place].AT,PERRAL Supple Neck,No JVD, No cervical lymphadenopathy appriciated.  Symmetrical Chest wall movement, Good air movement bilaterally, CTAB RRR,No Gallops,Rubs or new Murmurs, No Parasternal Heave +ve B.Sounds, Abd Soft, Non tender, No organomegaly appriciated, No rebound -guarding or rigidity.  No Cyanosis, Clubbing or edema, No new Rash or bruise   PERTINENT RADIOLOGIC STUDIES: DG CHEST PORT 1 VIEW  Result Date: 11/26/2020 CLINICAL DATA:  Dyspnea. EXAM: PORTABLE CHEST 1 VIEW COMPARISON:  November 19, 2020. FINDINGS: The heart size and mediastinal contours are within normal limits. Status post coronary bypass graft. No pneumothorax or pleural effusion is noted. Both lungs are clear. The visualized skeletal structures are unremarkable. IMPRESSION: No active disease. Electronically Signed   By: Marijo Conception M.D.   On: 11/26/2020 11:40   DG Chest Port 1 View  Result Date: 11/19/2020 CLINICAL DATA:  Reported COVID-19 positive EXAM: PORTABLE CHEST 1 VIEW COMPARISON:  November 12, 2020 and October 29, 2020 chest radiographs. FINDINGS: There has been clearing of airspace opacity bilaterally. Subtle interstitial thickening is noted in the bases. No consolidation. Heart is mildly enlarged, stable. Pulmonary vascularity is normal. No adenopathy. Patient is status post coronary artery bypass grafting. Bones are osteoporotic. Prior rib fractures noted on the left. IMPRESSION: Subtle interstitial thickening in the bases. This interstitial thickening may represent residua of COVID  19 pneumonitis. There may also be a degree of underlying bronchitis. No airspace consolidation noted. Stable cardiac prominence. No adenopathy evident. Bones osteoporotic. Electronically Signed   By: Lowella Grip III M.D.   On: 11/19/2020 13:35   DG Chest Port 1 View  Result Date: 11/12/2020 CLINICAL DATA:  Fever.  Sepsis. EXAM: PORTABLE CHEST 1 VIEW COMPARISON:  10/29/2020.  10/26/2020. FINDINGS: PICC line noted stable position. Prior CABG. Cardiomegaly. Bilateral interstitial infiltrates/edema again noted with interim improvement from prior exam. Tiny bilateral pleural effusions cannot be excluded. Right costophrenic angle incompletely imaged. No pneumothorax. Deformity again noted of the proximal left humerus. Carotid and peripheral vascular calcification noted. IMPRESSION: 1. PICC line noted in stable position. 2. Prior CABG. Cardiomegaly. Bilateral interstitial infiltrates/edema again noted with interim improvement from prior exam. Tiny bilateral pleural effusions cannot be excluded. 3. Carotid and peripheral vascular disease. Electronically Signed   By: Marcello Moores  Register   On: 11/12/2020 08:28   VAS [place] UPPER EXTREMITY VENOUS DUPLEX  Result Date: 11/13/2020 UPPER VENOUS STUDY  Indications: Swelling RT arm, PICC line Limitations: Bandaging around PICC insertion. Comparison Study: No prior studies. Performing Technologist: Darlin Coco RDMS  Examination Guidelines: A complete evaluation includes B-mode imaging, spectral Doppler, color Doppler, and power Doppler as needed of all accessible portions of each vessel. Bilateral testing is considered an integral part of a complete examination. Limited examinations for reoccurring indications may be performed as noted.  Right Findings: +----------+------------+---------+-----------+----------+---------------------+ RIGHT     CompressiblePhasicitySpontaneousProperties       Summary         +----------+------------+---------+-----------+----------+---------------------+ IJV           Full       Yes       Yes                                    +----------+------------+---------+-----------+----------+---------------------+ Subclavian    Full       Yes       Yes                                    +----------+------------+---------+-----------+----------+---------------------+ Axillary      Full       Yes       Yes                                    +----------+------------+---------+-----------+----------+---------------------+  Brachial      Full       Yes       Yes                Some segments not                                                        well visualized due                                                          to bandaging-                                                          visualized segments                                                             patent         +----------+------------+---------+-----------+----------+---------------------+ Radial        Full                                                        +----------+------------+---------+-----------+----------+---------------------+ Ulnar         Full                                                        +----------+------------+---------+-----------+----------+---------------------+ Cephalic    Partial      Yes       Yes                Age Indeterminate   +----------+------------+---------+-----------+----------+---------------------+ Basilic                                              Not well visualized                                                        due to bandaging    +----------+------------+---------+-----------+----------+---------------------+  Left Findings: +----------+------------+---------+-----------+----------+-------+ LEFT      CompressiblePhasicitySpontaneousPropertiesSummary  +----------+------------+---------+-----------+----------+-------+ Subclavian    Full       Yes       Yes                      +----------+------------+---------+-----------+----------+-------+  Summary:  Right: No evidence of deep vein thrombosis in the upper extremity. Findings consistent with age indeterminate superficial vein thrombosis involving the right cephalic vein. However, unable to visualize the basilic vein along with some segments of the brachial vein at the PICC insertion.  Left: No evidence of thrombosis in the subclavian.  *See table(s) above for measurements and observations.  Diagnosing physician: Harold Barban MD Electronically signed by Harold Barban MD on 11/13/2020 at 7:12:19 PM.    Final      PERTINENT LAB RESULTS: CBC: No results for input(s): WBC, HGB, HCT, PLT in the last 72 hours. CMET CMP     Component Value Date/Time   NA 135 11/27/2020 0138   NA 139 05/31/2020 0915   K 4.3 11/27/2020 0138   CL 103 11/27/2020 0138   CO2 23 11/27/2020 0138   GLUCOSE 181 (H) 11/27/2020 0138   BUN 16 11/27/2020 0138   BUN 12 05/31/2020 0915   CREATININE 0.88 11/27/2020 0138   CREATININE 0.97 01/06/2019 1527   CALCIUM 8.2 (L) 11/27/2020 0138   PROT 7.0 11/20/2020 0122   PROT 6.8 05/31/2020 0915   ALBUMIN 2.2 (L) 11/20/2020 0122   ALBUMIN 3.9 05/31/2020 0915   AST 51 (H) 11/20/2020 0122   ALT 68 (H) 11/20/2020 0122   ALKPHOS 120 11/20/2020 0122   BILITOT 1.0 11/20/2020 0122   BILITOT 0.3 05/31/2020 0915   GFRNONAA >60 11/27/2020 0138   GFRNONAA 81 01/06/2019 1527   GFRAA 88 05/31/2020 0915   GFRAA 94 01/06/2019 1527    GFR Estimated Creatinine Clearance: 95.2 mL/min (by C-G formula based on SCr of 0.88 mg/dL). No results for input(s): LIPASE, AMYLASE in the last 72 hours. No results for input(s): CKTOTAL, CKMB, CKMBINDEX, TROPONINI in the last 72 hours. Invalid input(s): POCBNP No results for input(s): DDIMER in the last 72 hours. No results for input(s):  HGBA1C in the last 72 hours. No results for input(s): CHOL, HDL, LDLCALC, TRIG, CHOLHDL, LDLDIRECT in the last 72 hours. No results for input(s): TSH, T4TOTAL, T3FREE, THYROIDAB in the last 72 hours.  Invalid input(s): FREET3 No results for input(s): VITAMINB12, FOLATE, FERRITIN, TIBC, IRON, RETICCTPCT in the last 72 hours. Coags: No results for input(s): INR in the last 72 hours.  Invalid input(s): PT Microbiology: Recent Results (from the past 240 hour(s))  Resp Panel by RT-PCR (Flu A&B, Covid) Nasopharyngeal Swab     Status: Abnormal   Collection Time: 11/19/20  4:11 PM   Specimen: Nasopharyngeal Swab; Nasopharyngeal(NP) swabs in vial transport medium  Result Value Ref Range Status   SARS Coronavirus 2 by RT PCR POSITIVE (A) NEGATIVE Final    Comment: RESULT CALLED TO, READ BACK BY AND VERIFIED WITH: Gordy Clement RN [id number] 11/19/20 A BROWNING (NOTE) SARS-CoV-2 target nucleic acids are DETECTED.  The SARS-CoV-2 RNA is generally detectable in upper respiratory specimens during the acute phase of infection. Positive results are indicative of the presence of the identified virus, but do not rule out bacterial infection or co-infection with other pathogens not detected by the test. Clinical correlation with patient history and other diagnostic information is necessary to determine patient infection status. The expected result is Negative.  Fact Sheet for Patients: EntrepreneurPulse.com.au  Fact Sheet for Healthcare Providers: IncredibleEmployment.be  This test is not yet approved or cleared by the [place] FDA and  has been authorized for detection and/or diagnosis of SARS-CoV-2 by FDA under an Emergency Use Authorization (EUA).  This EUA will remain in effect (meaning this  test can b e used) for the duration of  the COVID-19 declaration under Section 564(b)(1) of the Act, 21 U.S.C. section 360bbb-3(b)(1), unless the authorization is terminated  or revoked sooner.     Influenza A by PCR NEGATIVE NEGATIVE Final   Influenza B by PCR NEGATIVE NEGATIVE Final    Comment: (NOTE) The Xpert Xpress SARS-CoV-2/FLU/RSV plus assay is intended as an aid in the diagnosis of influenza from Nasopharyngeal swab specimens and should not be used as a sole basis for treatment. Nasal washings and aspirates are unacceptable for Xpert Xpress SARS-CoV-2/FLU/RSV testing.  Fact Sheet for Patients: EntrepreneurPulse.com.au  Fact Sheet for Healthcare Providers: IncredibleEmployment.be  This test is not yet approved or cleared by the [place] FDA and has been authorized for detection and/or diagnosis of SARS-CoV-2 by FDA under an Emergency Use Authorization (EUA). This EUA will remain in effect (meaning this test can be used) for the duration of the COVID-19 declaration under Section 564(b)(1) of the Act, 21 U.S.C. section 360bbb-3(b)(1), unless the authorization is terminated or revoked.  Performed at [facility], [place] [address], [place], [place] [address]     FURTHER DISCHARGE INSTRUCTIONS:  Get Medicines reviewed and adjusted: Please take all your medications with you for your next visit with your Primary MD  Laboratory/radiological data: Please request your Primary MD to go over all hospital tests and procedure/radiological results at the follow up, please ask your Primary MD to get all Hospital records sent to his/her office.  In some cases, they will be blood work, cultures and biopsy results pending at the time of your discharge. Please request that your primary care M.D. goes through all the records of your hospital data and follows up on these results.  Also Note the following: If you experience worsening of your admission symptoms, develop shortness of breath, life threatening emergency, suicidal or homicidal thoughts you must seek medical attention immediately by calling 911 or calling  your MD immediately  if symptoms less severe.  You must read complete instructions/literature along with all the possible adverse reactions/side effects for all the Medicines you take and that have been prescribed to you. Take any new Medicines after you have completely understood and accpet all the possible adverse reactions/side effects.   Do not drive when taking Pain medications or sleeping medications (Benzodaizepines)  Do not take more than prescribed Pain, Sleep and Anxiety Medications. It is not advisable to combine anxiety,sleep and pain medications without talking with your primary care practitioner  Special Instructions: If you have smoked or chewed Tobacco  in the last 2 yrs please stop smoking, stop any regular Alcohol  and or any Recreational drug use.  Wear Seat belts while driving.  Please note: You were cared for by a hospitalist during your hospital stay. Once you are discharged, your primary care physician will handle any further medical issues. Please note that NO REFILLS for any discharge medications will be authorized once you are discharged, as it is imperative that you return to your primary care physician (or establish a relationship with a primary care physician if you do not have one) for your post hospital discharge needs so that they can reassess your need for medications and monitor your lab values.  Total Time spent coordinating discharge including counseling, education and face to face time equals 35 minutes.  SignedOren Binet 11/29/2020 10:23 AM

## 2020-11-29 NOTE — Plan of Care (Signed)
  Problem: Education: ?Goal: Knowledge of General Education information will improve ?Description: Including pain rating scale, medication(s)/side effects and non-pharmacologic comfort measures ?Outcome: Progressing ?  ?Problem: Health Behavior/Discharge Planning: ?Goal: Ability to manage health-related needs will improve ?Outcome: Progressing ?  ?Problem: Clinical Measurements: ?Goal: Ability to maintain clinical measurements within normal limits will improve ?Outcome: Progressing ?Goal: Will remain free from infection ?Outcome: Progressing ?Goal: Diagnostic test results will improve ?Outcome: Progressing ?Goal: Respiratory complications will improve ?Outcome: Progressing ?Goal: Cardiovascular complication will be avoided ?Outcome: Progressing ?  ?Problem: Activity: ?Goal: Risk for activity intolerance will decrease ?Outcome: Progressing ?  ?Problem: Nutrition: ?Goal: Adequate nutrition will be maintained ?Outcome: Progressing ?  ?Problem: Coping: ?Goal: Level of anxiety will decrease ?Outcome: Progressing ?  ?Problem: Elimination: ?Goal: Will not experience complications related to bowel motility ?Outcome: Progressing ?Goal: Will not experience complications related to urinary retention ?Outcome: Progressing ?  ?Problem: Pain Managment: ?Goal: General experience of comfort will improve ?Outcome: Progressing ?  ?Problem: Safety: ?Goal: Ability to remain free from injury will improve ?Outcome: Progressing ?  ?Problem: Skin Integrity: ?Goal: Risk for impaired skin integrity will decrease ?Outcome: Progressing ?  ?Problem: Safety: ?Goal: Non-violent Restraint(s) ?Outcome: Progressing ?  ?Problem: Education: ?Goal: Knowledge of risk factors and measures for prevention of condition will improve ?Outcome: Progressing ?  ?Problem: Coping: ?Goal: Psychosocial and spiritual needs will be supported ?Outcome: Progressing ?  ?Problem: Respiratory: ?Goal: Will maintain a patent airway ?Outcome: Progressing ?Goal:  Complications related to the disease process, condition or treatment will be avoided or minimized ?Outcome: Progressing ?  ?

## 2020-11-29 NOTE — TOC Transition Note (Signed)
Transition of Care [facility]) - CM/SW Discharge Note   Patient Details  Name: [name] MRN: [id number] Date of Birth: 04-30-1951  [facility] CM/SW Contact:  [place], [place] Phone Number: [phone] 11/29/2020, 11:29 AM   Clinical Narrative:    Patient will DC to: [facility] Anticipated DC date: 11/29/20 Family notified: Yes, patients spouse Chrisotpher Rivero [phone] Transport by: Corey Harold   Per MD patient ready for DC to [facility]. RN to call report prior to discharge [phone]). RN, patient, patient's family, and facility notified of DC. Discharge Summary and FL2 sent to facility. DC packet on chart. Ambulance transport requested for patient.   CSW will sign off for now as social work intervention is no longer needed. Please consult [place] again if new needs arise.  Final next level of care: Skilled Nursing Facility Barriers to Discharge: Barriers Resolved   Patient Goals and CMS Choice Patient states their goals for this hospitalization and ongoing recovery are:: rehab CMS Medicare.gov Compare Post Acute Care list provided to:: Patient Represenative (must comment) Choice offered to / list presented to : Spouse  Discharge Placement PASRR number recieved: 11/29/20 Existing PASRR number confirmed : 11/29/20          Patient chooses bed at: [facility]) Patient to be transferred to facility by: [facility] Name of family member notified: Patients Spouse, Azure Barrales [phone] Patient and family notified of of transfer: 11/29/20  Discharge Plan and Services In-house Referral: Clinical Social Work   Post Acute Care Choice: Hospice          DME Arranged: N/A DME Agency: NA       HH Arranged: NA HH Agency: NA        Social Determinants of Health (SDOH) Interventions     Readmission Risk Interventions No flowsheet data found.

## 2020-12-03 ENCOUNTER — Telehealth: Payer: Self-pay

## 2020-12-03 NOTE — Telephone Encounter (Signed)
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 She says it looks good - no redness or drainage. Advised her it can be open to air.

## 2020-12-05 ENCOUNTER — Ambulatory Visit: Payer: Medicare Other | Admitting: Infectious Disease

## 2020-12-09 ENCOUNTER — Other Ambulatory Visit: Payer: Self-pay

## 2020-12-09 ENCOUNTER — Inpatient Hospital Stay (HOSPITAL_COMMUNITY)
Admission: EM | Admit: 2020-12-09 | Discharge: 2020-12-18 | DRG: 871 | Disposition: A | Discharge status: Skilled Nursing Facility | Payer: Medicare Other | Attending: Internal Medicine | Admitting: Internal Medicine

## 2020-12-09 ENCOUNTER — Observation Stay (HOSPITAL_COMMUNITY): Payer: Medicare Other

## 2020-12-09 ENCOUNTER — Encounter (HOSPITAL_COMMUNITY): Payer: Self-pay

## 2020-12-09 ENCOUNTER — Emergency Department (HOSPITAL_COMMUNITY): Payer: Medicare Other

## 2020-12-09 DIAGNOSIS — G4733 Obstructive sleep apnea (adult) (pediatric): Secondary | ICD-10-CM | POA: Diagnosis present

## 2020-12-09 DIAGNOSIS — I130 Hypertensive heart and chronic kidney disease with heart failure and stage 1 through stage 4 chronic kidney disease, or unspecified chronic kidney disease: Secondary | ICD-10-CM | POA: Diagnosis present

## 2020-12-09 DIAGNOSIS — Z885 Allergy status to narcotic agent status: Secondary | ICD-10-CM

## 2020-12-09 DIAGNOSIS — R6521 Severe sepsis with septic shock: Secondary | ICD-10-CM | POA: Diagnosis not present

## 2020-12-09 DIAGNOSIS — I482 Chronic atrial fibrillation, unspecified: Secondary | ICD-10-CM | POA: Diagnosis present

## 2020-12-09 DIAGNOSIS — E876 Hypokalemia: Secondary | ICD-10-CM | POA: Diagnosis present

## 2020-12-09 DIAGNOSIS — Z9911 Dependence on respirator [ventilator] status: Secondary | ICD-10-CM | POA: Diagnosis not present

## 2020-12-09 DIAGNOSIS — Z8616 Personal history of COVID-19: Secondary | ICD-10-CM

## 2020-12-09 DIAGNOSIS — E785 Hyperlipidemia, unspecified: Secondary | ICD-10-CM | POA: Diagnosis present

## 2020-12-09 DIAGNOSIS — I69354 Hemiplegia and hemiparesis following cerebral infarction affecting left non-dominant side: Secondary | ICD-10-CM

## 2020-12-09 DIAGNOSIS — Z4659 Encounter for fitting and adjustment of other gastrointestinal appliance and device: Secondary | ICD-10-CM

## 2020-12-09 DIAGNOSIS — J9690 Respiratory failure, unspecified, unspecified whether with hypoxia or hypercapnia: Secondary | ICD-10-CM

## 2020-12-09 DIAGNOSIS — Z7901 Long term (current) use of anticoagulants: Secondary | ICD-10-CM

## 2020-12-09 DIAGNOSIS — A4189 Other specified sepsis: Principal | ICD-10-CM | POA: Diagnosis present

## 2020-12-09 DIAGNOSIS — Z452 Encounter for adjustment and management of vascular access device: Secondary | ICD-10-CM

## 2020-12-09 DIAGNOSIS — M79672 Pain in left foot: Secondary | ICD-10-CM

## 2020-12-09 DIAGNOSIS — J9601 Acute respiratory failure with hypoxia: Secondary | ICD-10-CM

## 2020-12-09 DIAGNOSIS — E875 Hyperkalemia: Secondary | ICD-10-CM | POA: Diagnosis present

## 2020-12-09 DIAGNOSIS — Z87442 Personal history of urinary calculi: Secondary | ICD-10-CM

## 2020-12-09 DIAGNOSIS — I4892 Unspecified atrial flutter: Secondary | ICD-10-CM | POA: Diagnosis present

## 2020-12-09 DIAGNOSIS — Z951 Presence of aortocoronary bypass graft: Secondary | ICD-10-CM | POA: Diagnosis not present

## 2020-12-09 DIAGNOSIS — E1165 Type 2 diabetes mellitus with hyperglycemia: Secondary | ICD-10-CM | POA: Diagnosis present

## 2020-12-09 DIAGNOSIS — R069 Unspecified abnormalities of breathing: Secondary | ICD-10-CM

## 2020-12-09 DIAGNOSIS — Z8249 Family history of ischemic heart disease and other diseases of the circulatory system: Secondary | ICD-10-CM

## 2020-12-09 DIAGNOSIS — F0390 Unspecified dementia without behavioral disturbance: Secondary | ICD-10-CM | POA: Diagnosis not present

## 2020-12-09 DIAGNOSIS — J159 Unspecified bacterial pneumonia: Secondary | ICD-10-CM | POA: Diagnosis not present

## 2020-12-09 DIAGNOSIS — N179 Acute kidney failure, unspecified: Secondary | ICD-10-CM | POA: Diagnosis not present

## 2020-12-09 DIAGNOSIS — I2510 Atherosclerotic heart disease of native coronary artery without angina pectoris: Secondary | ICD-10-CM | POA: Diagnosis not present

## 2020-12-09 DIAGNOSIS — J189 Pneumonia, unspecified organism: Secondary | ICD-10-CM

## 2020-12-09 DIAGNOSIS — E1142 Type 2 diabetes mellitus with diabetic polyneuropathy: Secondary | ICD-10-CM | POA: Diagnosis present

## 2020-12-09 DIAGNOSIS — L8990 Pressure ulcer of unspecified site, unspecified stage: Secondary | ICD-10-CM | POA: Insufficient documentation

## 2020-12-09 DIAGNOSIS — Z794 Long term (current) use of insulin: Secondary | ICD-10-CM

## 2020-12-09 DIAGNOSIS — Y95 Nosocomial condition: Secondary | ICD-10-CM | POA: Diagnosis not present

## 2020-12-09 DIAGNOSIS — Z79899 Other long term (current) drug therapy: Secondary | ICD-10-CM

## 2020-12-09 DIAGNOSIS — R0602 Shortness of breath: Secondary | ICD-10-CM

## 2020-12-09 DIAGNOSIS — Z6838 Body mass index (BMI) 38.0-38.9, adult: Secondary | ICD-10-CM | POA: Diagnosis not present

## 2020-12-09 DIAGNOSIS — Z89612 Acquired absence of left leg above knee: Secondary | ICD-10-CM | POA: Diagnosis not present

## 2020-12-09 DIAGNOSIS — Z833 Family history of diabetes mellitus: Secondary | ICD-10-CM

## 2020-12-09 DIAGNOSIS — N182 Chronic kidney disease, stage 2 (mild): Secondary | ICD-10-CM | POA: Diagnosis not present

## 2020-12-09 DIAGNOSIS — G546 Phantom limb syndrome with pain: Secondary | ICD-10-CM | POA: Diagnosis present

## 2020-12-09 DIAGNOSIS — I5023 Acute on chronic systolic (congestive) heart failure: Secondary | ICD-10-CM | POA: Diagnosis present

## 2020-12-09 DIAGNOSIS — F17210 Nicotine dependence, cigarettes, uncomplicated: Secondary | ICD-10-CM | POA: Diagnosis present

## 2020-12-09 DIAGNOSIS — E1122 Type 2 diabetes mellitus with diabetic chronic kidney disease: Secondary | ICD-10-CM | POA: Diagnosis present

## 2020-12-09 DIAGNOSIS — I252 Old myocardial infarction: Secondary | ICD-10-CM

## 2020-12-09 DIAGNOSIS — Z888 Allergy status to other drugs, medicaments and biological substances status: Secondary | ICD-10-CM

## 2020-12-09 DIAGNOSIS — A419 Sepsis, unspecified organism: Secondary | ICD-10-CM

## 2020-12-09 DIAGNOSIS — G8929 Other chronic pain: Secondary | ICD-10-CM

## 2020-12-09 LAB — BLOOD GAS, ARTERIAL
Acid-base deficit: 7.4 mmol/L — ABNORMAL HIGH (ref 0.0–2.0)
Acid-base deficit: 9.3 mmol/L — ABNORMAL HIGH (ref 0.0–2.0)
Bicarbonate: 18.5 mmol/L — ABNORMAL LOW (ref 20.0–28.0)
Bicarbonate: 16.5 mmol/L — ABNORMAL LOW (ref 20.0–28.0)
FIO2: 80
FIO2: 100
O2 Saturation: 99.4 %
O2 Saturation: 97.2 %
Patient temperature: 37
Patient temperature: 37
pCO2 arterial: 35.5 mmHg (ref 32.0–48.0)
pCO2 arterial: 45.2 mmHg (ref 32.0–48.0)
pH, Arterial: 7.316 — ABNORMAL LOW (ref 7.350–7.450)
pH, Arterial: 7.205 — ABNORMAL LOW (ref 7.350–7.450)
pO2, Arterial: 220 mmHg — ABNORMAL HIGH (ref 83.0–108.0)
pO2, Arterial: 118 mmHg — ABNORMAL HIGH (ref 83.0–108.0)

## 2020-12-09 LAB — COMPREHENSIVE METABOLIC PANEL
ALT: 18 U/L (ref 0–44)
ALT: 19 U/L (ref 0–44)
AST: 22 U/L (ref 15–41)
AST: 29 U/L (ref 15–41)
Albumin: 2.7 g/dL — ABNORMAL LOW (ref 3.5–5.0)
Albumin: 2.2 g/dL — ABNORMAL LOW (ref 3.5–5.0)
Alkaline Phosphatase: 85 U/L (ref 38–126)
Alkaline Phosphatase: 84 U/L (ref 38–126)
Anion gap: 15 (ref 5–15)
Anion gap: 13 (ref 5–15)
BUN: 63 mg/dL — ABNORMAL HIGH (ref 8–23)
BUN: 58 mg/dL — ABNORMAL HIGH (ref 8–23)
CO2: 19 mmol/L — ABNORMAL LOW (ref 22–32)
CO2: 17 mmol/L — ABNORMAL LOW (ref 22–32)
Calcium: 8.9 mg/dL (ref 8.9–10.3)
Calcium: 8.1 mg/dL — ABNORMAL LOW (ref 8.9–10.3)
Chloride: 107 mmol/L (ref 98–111)
Chloride: 108 mmol/L (ref 98–111)
Creatinine, Ser: 2.8 mg/dL — ABNORMAL HIGH (ref 0.61–1.24)
Creatinine, Ser: 2.55 mg/dL — ABNORMAL HIGH (ref 0.61–1.24)
GFR, Estimated: 24 mL/min — ABNORMAL LOW (ref >60)
GFR, Estimated: 27 mL/min — ABNORMAL LOW (ref >60)
Glucose, Bld: 177 mg/dL — ABNORMAL HIGH (ref 70–99)
Glucose, Bld: 321 mg/dL — ABNORMAL HIGH (ref 70–99)
Potassium: 6.4 mmol/L (ref 3.5–5.1)
Potassium: 5.2 mmol/L — ABNORMAL HIGH (ref 3.5–5.1)
Sodium: 141 mmol/L (ref 135–145)
Sodium: 138 mmol/L (ref 135–145)
Total Bilirubin: 1.3 mg/dL — ABNORMAL HIGH (ref 0.3–1.2)
Total Bilirubin: 1.3 mg/dL — ABNORMAL HIGH (ref 0.3–1.2)
Total Protein: 6.7 g/dL (ref 6.5–8.1)
Total Protein: 6.3 g/dL — ABNORMAL LOW (ref 6.5–8.1)

## 2020-12-09 LAB — CBC WITH DIFFERENTIAL/PLATELET
Abs Immature Granulocytes: 0.19 10*3/uL — ABNORMAL HIGH (ref 0.00–0.07)
Basophils Absolute: 0.1 10*3/uL (ref 0.0–0.1)
Basophils Relative: 0 %
Eosinophils Absolute: 0.1 10*3/uL (ref 0.0–0.5)
Eosinophils Relative: 1 %
HCT: 35.4 % — ABNORMAL LOW (ref 39.0–52.0)
Hemoglobin: 10.9 g/dL — ABNORMAL LOW (ref 13.0–17.0)
Immature Granulocytes: 1 %
Lymphocytes Relative: 10 %
Lymphs Abs: 1.8 10*3/uL (ref 0.7–4.0)
MCH: 32.1 pg (ref 26.0–34.0)
MCHC: 30.8 g/dL (ref 30.0–36.0)
MCV: 104.1 fL — ABNORMAL HIGH (ref 80.0–100.0)
Monocytes Absolute: 1.2 10*3/uL — ABNORMAL HIGH (ref 0.1–1.0)
Monocytes Relative: 6 %
Neutro Abs: 15.7 10*3/uL — ABNORMAL HIGH (ref 1.7–7.7)
Neutrophils Relative %: 82 %
Platelets: 221 10*3/uL (ref 150–400)
RBC: 3.4 MIL/uL — ABNORMAL LOW (ref 4.22–5.81)
RDW: 16 % — ABNORMAL HIGH (ref 11.5–15.5)
WBC: 19.1 10*3/uL — ABNORMAL HIGH (ref 4.0–10.5)
nRBC: 0 % (ref 0.0–0.2)

## 2020-12-09 LAB — POCT I-STAT 7, (LYTES, BLD GAS, ICA,H+H)
Acid-base deficit: 9 mmol/L — ABNORMAL HIGH (ref 0.0–2.0)
Bicarbonate: 16.8 mmol/L — ABNORMAL LOW (ref 20.0–28.0)
Calcium, Ion: 1.22 mmol/L (ref 1.15–1.40)
HCT: 32 % — ABNORMAL LOW (ref 39.0–52.0)
Hemoglobin: 10.9 g/dL — ABNORMAL LOW (ref 13.0–17.0)
O2 Saturation: 93 %
Patient temperature: 37.2
Potassium: 5.5 mmol/L — ABNORMAL HIGH (ref 3.5–5.1)
Sodium: 141 mmol/L (ref 135–145)
TCO2: 18 mmol/L — ABNORMAL LOW (ref 22–32)
pCO2 arterial: 35.8 mmHg (ref 32.0–48.0)
pH, Arterial: 7.281 — ABNORMAL LOW (ref 7.350–7.450)
pO2, Arterial: 76 mmHg — ABNORMAL LOW (ref 83.0–108.0)

## 2020-12-09 LAB — TROPONIN I (HIGH SENSITIVITY)
Troponin I (High Sensitivity): 19 ng/L — ABNORMAL HIGH (ref <18)
Troponin I (High Sensitivity): 19 ng/L — ABNORMAL HIGH (ref <18)

## 2020-12-09 LAB — GLUCOSE, CAPILLARY
Glucose-Capillary: 217 mg/dL — ABNORMAL HIGH (ref 70–99)
Glucose-Capillary: 273 mg/dL — ABNORMAL HIGH (ref 70–99)

## 2020-12-09 LAB — PROTIME-INR
INR: 2.3 — ABNORMAL HIGH (ref 0.8–1.2)
Prothrombin Time: 24.8 seconds — ABNORMAL HIGH (ref 11.4–15.2)

## 2020-12-09 LAB — URINALYSIS, ROUTINE W REFLEX MICROSCOPIC
Bilirubin Urine: NEGATIVE
Glucose, UA: NEGATIVE mg/dL
Hgb urine dipstick: NEGATIVE
Ketones, ur: NEGATIVE mg/dL
Leukocytes,Ua: NEGATIVE
Nitrite: NEGATIVE
Protein, ur: NEGATIVE mg/dL
Specific Gravity, Urine: 1.019 (ref 1.005–1.030)
pH: 5 (ref 5.0–8.0)

## 2020-12-09 LAB — CBG MONITORING, ED
Glucose-Capillary: 214 mg/dL — ABNORMAL HIGH (ref 70–99)
Glucose-Capillary: 194 mg/dL — ABNORMAL HIGH (ref 70–99)

## 2020-12-09 LAB — MRSA PCR SCREENING: MRSA by PCR: NEGATIVE

## 2020-12-09 LAB — LACTIC ACID, PLASMA
Lactic Acid, Venous: 3.9 mmol/L (ref 0.5–1.9)
Lactic Acid, Venous: 4.6 mmol/L (ref 0.5–1.9)
Lactic Acid, Venous: 3.9 mmol/L (ref 0.5–1.9)

## 2020-12-09 LAB — BRAIN NATRIURETIC PEPTIDE: B Natriuretic Peptide: 316 pg/mL — ABNORMAL HIGH (ref 0.0–100.0)

## 2020-12-09 LAB — AMMONIA: Ammonia: 16 umol/L (ref 9–35)

## 2020-12-09 LAB — LIPASE, BLOOD: Lipase: 19 U/L (ref 11–51)

## 2020-12-09 MED ORDER — SODIUM CHLORIDE 0.9 % IV SOLN
2.0000 g | Freq: Once | INTRAVENOUS | Status: AC
  Administered 2020-12-09: 2 g via INTRAVENOUS
  Filled 2020-12-09: qty 2

## 2020-12-09 MED ORDER — SODIUM CHLORIDE 0.9 % IV SOLN
2.0000 g | Freq: Two times a day (BID) | INTRAVENOUS | Status: DC
  Administered 2020-12-10 – 2020-12-11 (×3): 2 g via INTRAVENOUS
  Filled 2020-12-09 (×3): qty 2

## 2020-12-09 MED ORDER — ROCURONIUM BROMIDE 50 MG/5ML IV SOLN
100.0000 mg | Freq: Once | INTRAVENOUS | Status: AC
  Administered 2020-12-09: 100 mg via INTRAVENOUS

## 2020-12-09 MED ORDER — SODIUM CHLORIDE 0.9 % IV BOLUS
1000.0000 mL | Freq: Once | INTRAVENOUS | Status: AC
  Administered 2020-12-09: 1000 mL via INTRAVENOUS

## 2020-12-09 MED ORDER — NOREPINEPHRINE 16 MG/250ML-% IV SOLN
0.0000 ug/min | INTRAVENOUS | Status: DC
  Administered 2020-12-09: 40 ug/min via INTRAVENOUS
  Administered 2020-12-10: 16 ug/min via INTRAVENOUS
  Filled 2020-12-09 (×2): qty 250

## 2020-12-09 MED ORDER — INSULIN ASPART 100 UNIT/ML IV SOLN
10.0000 [IU] | Freq: Once | INTRAVENOUS | Status: AC
  Administered 2020-12-09: 10 [IU] via INTRAVENOUS

## 2020-12-09 MED ORDER — FENTANYL CITRATE (PF) 100 MCG/2ML IJ SOLN: 50.0000 ug | Freq: Once | INTRAMUSCULAR | Status: AC

## 2020-12-09 MED ORDER — ORAL CARE MOUTH RINSE
15.0000 mL | OROMUCOSAL | Status: DC
  Administered 2020-12-10 – 2020-12-11 (×17): 15 mL via OROMUCOSAL

## 2020-12-09 MED ORDER — INSULIN ASPART 100 UNIT/ML ~~LOC~~ SOLN
0.0000 [IU] | SUBCUTANEOUS | Status: DC | PRN
Start: 2020-12-09 — End: 2020-12-10
  Administered 2020-12-10: 5 [IU] via SUBCUTANEOUS
  Administered 2020-12-10: 2 [IU] via SUBCUTANEOUS

## 2020-12-09 MED ORDER — LACTATED RINGERS IV SOLN: INTRAVENOUS | Status: DC

## 2020-12-09 MED ORDER — DEXTROSE 50 % IV SOLN
1.0000 | Freq: Once | INTRAVENOUS | Status: AC
  Administered 2020-12-09: 50 mL via INTRAVENOUS
  Filled 2020-12-09: qty 50

## 2020-12-09 MED ORDER — SODIUM BICARBONATE 8.4 % IV SOLN: 50.0000 meq | Freq: Once | INTRAVENOUS | Status: AC

## 2020-12-09 MED ORDER — CHLORHEXIDINE GLUCONATE 0.12% ORAL RINSE (MEDLINE KIT)
15.0000 mL | Freq: Two times a day (BID) | OROMUCOSAL | Status: DC
  Administered 2020-12-10 – 2020-12-11 (×3): 15 mL via OROMUCOSAL

## 2020-12-09 MED ORDER — LACTATED RINGERS IV BOLUS (SEPSIS)
1000.0000 mL | Freq: Once | INTRAVENOUS | Status: AC
  Administered 2020-12-09: 1000 mL via INTRAVENOUS

## 2020-12-09 MED ORDER — CHLORHEXIDINE GLUCONATE CLOTH 2 % EX PADS
6.0000 | MEDICATED_PAD | Freq: Every day | CUTANEOUS | Status: DC
  Administered 2020-12-10 – 2020-12-18 (×7): 6 via TOPICAL

## 2020-12-09 MED ORDER — ETOMIDATE 2 MG/ML IV SOLN
20.0000 mg | Freq: Once | INTRAVENOUS | Status: AC
  Administered 2020-12-09: 20 mg via INTRAVENOUS

## 2020-12-09 MED ORDER — FENTANYL CITRATE (PF) 100 MCG/2ML IJ SOLN
25.0000 ug | INTRAMUSCULAR | Status: DC | PRN
  Administered 2020-12-10: 25 ug via INTRAVENOUS

## 2020-12-09 MED ORDER — VANCOMYCIN HCL 2000 MG/400ML IV SOLN
2000.0000 mg | Freq: Once | INTRAVENOUS | Status: AC
  Administered 2020-12-09: 2000 mg via INTRAVENOUS
  Filled 2020-12-09: qty 400

## 2020-12-09 MED ORDER — CHLORHEXIDINE GLUCONATE 0.12 % MT SOLN
15.0000 mL | Freq: Two times a day (BID) | OROMUCOSAL | Status: DC
  Administered 2020-12-09: 15 mL via OROMUCOSAL

## 2020-12-09 MED ORDER — FENTANYL CITRATE (PF) 100 MCG/2ML IJ SOLN
INTRAMUSCULAR | Status: AC
  Administered 2020-12-09: 50 ug via INTRAVENOUS
  Filled 2020-12-09: qty 2

## 2020-12-09 MED ORDER — SODIUM BICARBONATE 8.4 % IV SOLN
INTRAVENOUS | Status: AC
  Administered 2020-12-09: 50 meq via INTRAVENOUS
  Filled 2020-12-09: qty 50

## 2020-12-09 MED ORDER — FENTANYL 2500MCG IN NS 250ML (10MCG/ML) PREMIX INFUSION
25.0000 ug/h | INTRAVENOUS | Status: DC
  Administered 2020-12-09: 21:00:00 25 ug/h via INTRAVENOUS
  Administered 2020-12-10 – 2020-12-11 (×2): 200 ug/h via INTRAVENOUS
  Filled 2020-12-09 (×3): qty 250

## 2020-12-09 MED ORDER — VANCOMYCIN HCL IN DEXTROSE 1-5 GM/200ML-% IV SOLN
1000.0000 mg | Freq: Once | INTRAVENOUS | Status: DC
  Filled 2020-12-09: qty 200

## 2020-12-09 MED ORDER — VANCOMYCIN HCL 1500 MG/300ML IV SOLN
1500.0000 mg | INTRAVENOUS | Status: DC
  Filled 2020-12-09: qty 300

## 2020-12-09 MED ORDER — PANTOPRAZOLE SODIUM 40 MG IV SOLR
40.0000 mg | Freq: Every day | INTRAVENOUS | Status: DC
  Administered 2020-12-10 – 2020-12-12 (×3): 40 mg via INTRAVENOUS
  Filled 2020-12-09 (×3): qty 40

## 2020-12-09 MED ORDER — VASOPRESSIN 20 UNITS/100 ML INFUSION FOR SHOCK
0.0000 [IU]/min | INTRAVENOUS | Status: DC
  Administered 2020-12-09 – 2020-12-11 (×4): 0.03 [IU]/min via INTRAVENOUS
  Filled 2020-12-09 (×4): qty 100

## 2020-12-09 MED ORDER — NOREPINEPHRINE 4 MG/250ML-% IV SOLN
0.0000 ug/min | INTRAVENOUS | Status: DC
  Administered 2020-12-09: 2 ug/min via INTRAVENOUS
  Administered 2020-12-09: 40 ug/min via INTRAVENOUS
  Administered 2020-12-09: 25 ug/min via INTRAVENOUS
  Filled 2020-12-09 (×2): qty 250

## 2020-12-09 MED ORDER — SODIUM CHLORIDE 0.9% FLUSH
10.0000 mL | Freq: Two times a day (BID) | INTRAVENOUS | Status: DC
  Administered 2020-12-09 – 2020-12-12 (×6): 10 mL
  Administered 2020-12-13: 20 mL
  Administered 2020-12-13 – 2020-12-18 (×7): 10 mL

## 2020-12-09 MED ORDER — SODIUM CHLORIDE 0.9% FLUSH: 10.0000 mL | INTRAVENOUS | Status: DC | PRN

## 2020-12-09 MED ORDER — SODIUM POLYSTYRENE SULFONATE 15 GM/60ML PO SUSP
30.0000 g | Freq: Once | ORAL | Status: AC
  Administered 2020-12-09: 30 g via ORAL
  Filled 2020-12-09: qty 120

## 2020-12-09 NOTE — Progress Notes (Signed)
Pharmacy Antibiotic Note  [name] is a 70 y.o. male admitted on 12/09/2020 with pneumonia.  Pharmacy has been consulted for Vancomycin and Cefepime dosing.  Plan: Vancomycin 2000 mg IV x 1 dose. Vancomycin 1500 mg IV every 48 hours. Expected AUC 473 Cefepime 2000 mg IV every 12 hours. Monitor labs, c/s, and vanco level as indicated.  Height: 5\' 7"  (170.2 cm) Weight: 110.3 kg (243 lb 2.7 oz) IBW/kg (Calculated) : 66.1  Temp (24hrs), Avg:98.6 F (37 C), Min:97.5 F (36.4 C), Max:100.1 F (37.8 C)  Recent Labs  Lab 12/09/20 1253 12/09/20 1254  WBC 19.1*  --   CREATININE 2.80*  --   LATICACIDVEN  --  3.9*    Estimated Creatinine Clearance: 29.9 mL/min (A) (by C-G formula based on SCr of 2.8 mg/dL (H)).    Allergies  Allergen Reactions  . Flexeril [Cyclobenzaprine] Shortness Of Breath  . Tamsulosin Hcl Swelling, Rash and Other (See Comments)    Swelling around the mouth with rash on face Swelling around the mouth with rash on face Swelling around the mouth with rash on face  Swelling around the mouth with rash on face  . Tramadol Other (See Comments)    Headache  . Robaxin [Methocarbamol] Rash  . Aspirin     On plavix  . Morphine And Related Itching    Antimicrobials this admission: Vanco 2/28 >>  Cefepime 2/28 >>    Microbiology results: 2/28 BCx: pending   2/28 MRSA PCR: pending  Thank you for allowing pharmacy to be a part of this patient's care.  Ramond Craver 12/09/2020 2:07 PM
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
## 2020-12-09 NOTE — ED Notes (Signed)
Dr. Horton at bedside. 

## 2020-12-09 NOTE — ED Notes (Signed)
Respiratory at bedside.

## 2020-12-09 NOTE — ED Notes (Signed)
[facility] at bedside to transfer pt to [facility].

## 2020-12-09 NOTE — Sepsis Progress Note (Signed)
Repeat LA not drawn prior to transfer to [facility]. Pt is in route.

## 2020-12-09 NOTE — ED Notes (Signed)
Pt intubated with 7.5 ET tube x 1 attempt by Dr Dina Rich without difficulty, 25 at lip. +color change.

## 2020-12-09 NOTE — Progress Notes (Incomplete)
[place] Progress Note Patient Name: JOHNIE MAKKI DOB: 05/15/1951 MRN: [id number]   Date of Service  12/09/2020  HPI/Events of Note  24 M obese (BMI 70), smoker, history of CAD, atrial flutter, hypertension, dyslipidemia, DM (HbA1C 6.9)  s/p left AKA, recent COVID. He presented at [facility] ED for hypoxia with altered sensorium and was intubated.   eICU Interventions       Intervention Category Major Interventions: Respiratory failure - evaluation and management;Shock - evaluation and management Minor Interventions: Agitation / anxiety - evaluation and management Evaluation Type: New Patient Evaluation  Judd Lien 12/09/2020, 8:15 PM
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
## 2020-12-09 NOTE — ED Notes (Signed)
Dr. Vanita Panda made aware of pt's BP trending down again. BP 91/49, HR 79. Order given to start Levophed.

## 2020-12-09 NOTE — Progress Notes (Signed)
Notified provider and bedside nurse of need to order repeat lactic acid.  Second lactic resulted higher than first, have sent message to MD and bedside RN asking to consider ordering a 3rd lactic

## 2020-12-09 NOTE — Progress Notes (Signed)
elink monitoring sepsis 

## 2020-12-09 NOTE — ED Triage Notes (Signed)
Pt brought to ED via [facility] from [place] for AMS and low O2. Pt O2 sats 66% prior to EMS arrival. EMS unable to get BP. Pt placed on 2L of [place] on arrival. Pt O2 sats 85%.

## 2020-12-09 NOTE — Op Note (Signed)
Central Venous Catheter Insertion Procedure Note  [name]  [id number]  1951-03-29  Date:12/09/20  Time:9:07 PM   Provider Performing:Trisa Cranor R Quinnlan Abruzzo   Procedure: Insertion of Non-tunneled Central Venous Catheter(36556) without US guidance  Indication(s) Medication administration  Consent Unable to obtain consent due to emergent nature of procedure.  Anesthesia Topical only with 1% lidocaine   Timeout Verified patient identification, verified procedure, site/side was marked, verified correct patient position, special equipment/implants available, medications/allergies/relevant history reviewed, required imaging and test results available.  Sterile Technique Maximal sterile technique including full sterile barrier drape, hand hygiene, sterile gown, sterile gloves, mask, hair covering, sterile ultrasound probe cover (if used).  Procedure Description Area of catheter insertion was cleaned with chlorhexidine and draped in sterile fashion.  A central venous catheter was placed into the left subclavian vein using the Schlesinger technique. The TLC was sutured in place.  Nonpulsatile blood flow and easy flushing noted in all ports.  The catheter was sutured in place and sterile dressing applied.  Complications/Tolerance None; patient tolerated the procedure well. Chest X-ray is ordered to verify placement   EBL Minimal  Specimen(s) None

## 2020-12-09 NOTE — ED Notes (Signed)
Date and time results received: 12/09/20 1352 (use smartphrase ".now" to insert current time)  Test: Lactic/Potassium Critical Value: lactic 3.9/Potassium 6.4  Name of Provider Notified: Dr Dina Rich  Orders Received? Or Actions Taken?:NA

## 2020-12-09 NOTE — Progress Notes (Signed)
[place] Progress Note Patient Name: [name] DOB: 09/11/1951 MRN: [id number]   Date of Service  12/09/2020  HPI/Events of Note  66 M obese (BMI 77), smoker, history of CAD s/p CABG (2012), atrial flutter on apixaban, HFrEF 25-30%, hypertension, dyslipidemia, DM (HbA1C 6.9)  s/p left AKA, SDH and RMCA stroke with residual left sided deficit, recent COVID. He presented at [facility] ED for hypoxia with altered sensorium and was intubated.   eICU Interventions  Respiratory failure secondary to pneumonia on vancomycin and cefepime AKI with hyperkalemia, shifted K. Monitor electrolytes. Gentle hydration given depressed EF     Intervention Category Major Interventions: Respiratory failure - evaluation and management;Shock - evaluation and management Minor Interventions: Agitation / anxiety - evaluation and management Evaluation Type: New Patient Evaluation  Judd Lien 12/09/2020, 8:15 PM

## 2020-12-10 ENCOUNTER — Observation Stay (HOSPITAL_COMMUNITY): Payer: Medicare Other

## 2020-12-10 ENCOUNTER — Inpatient Hospital Stay (HOSPITAL_COMMUNITY): Payer: Medicare Other

## 2020-12-10 DIAGNOSIS — I482 Chronic atrial fibrillation, unspecified: Secondary | ICD-10-CM | POA: Diagnosis present

## 2020-12-10 DIAGNOSIS — I4892 Unspecified atrial flutter: Secondary | ICD-10-CM | POA: Diagnosis present

## 2020-12-10 DIAGNOSIS — E785 Hyperlipidemia, unspecified: Secondary | ICD-10-CM | POA: Diagnosis present

## 2020-12-10 DIAGNOSIS — Z9911 Dependence on respirator [ventilator] status: Secondary | ICD-10-CM | POA: Diagnosis not present

## 2020-12-10 DIAGNOSIS — I130 Hypertensive heart and chronic kidney disease with heart failure and stage 1 through stage 4 chronic kidney disease, or unspecified chronic kidney disease: Secondary | ICD-10-CM | POA: Diagnosis present

## 2020-12-10 DIAGNOSIS — N179 Acute kidney failure, unspecified: Secondary | ICD-10-CM | POA: Diagnosis present

## 2020-12-10 DIAGNOSIS — Y95 Nosocomial condition: Secondary | ICD-10-CM | POA: Diagnosis present

## 2020-12-10 DIAGNOSIS — E875 Hyperkalemia: Secondary | ICD-10-CM | POA: Diagnosis present

## 2020-12-10 DIAGNOSIS — I5021 Acute systolic (congestive) heart failure: Secondary | ICD-10-CM

## 2020-12-10 DIAGNOSIS — J189 Pneumonia, unspecified organism: Secondary | ICD-10-CM

## 2020-12-10 DIAGNOSIS — Z8616 Personal history of COVID-19: Secondary | ICD-10-CM | POA: Diagnosis not present

## 2020-12-10 DIAGNOSIS — A4189 Other specified sepsis: Secondary | ICD-10-CM | POA: Diagnosis present

## 2020-12-10 DIAGNOSIS — Z7901 Long term (current) use of anticoagulants: Secondary | ICD-10-CM | POA: Diagnosis not present

## 2020-12-10 DIAGNOSIS — R6521 Severe sepsis with septic shock: Secondary | ICD-10-CM | POA: Diagnosis present

## 2020-12-10 DIAGNOSIS — Z89612 Acquired absence of left leg above knee: Secondary | ICD-10-CM | POA: Diagnosis not present

## 2020-12-10 DIAGNOSIS — Z6838 Body mass index (BMI) 38.0-38.9, adult: Secondary | ICD-10-CM | POA: Diagnosis not present

## 2020-12-10 DIAGNOSIS — G4733 Obstructive sleep apnea (adult) (pediatric): Secondary | ICD-10-CM | POA: Diagnosis present

## 2020-12-10 DIAGNOSIS — A419 Sepsis, unspecified organism: Secondary | ICD-10-CM

## 2020-12-10 DIAGNOSIS — J159 Unspecified bacterial pneumonia: Secondary | ICD-10-CM | POA: Diagnosis present

## 2020-12-10 DIAGNOSIS — E1122 Type 2 diabetes mellitus with diabetic chronic kidney disease: Secondary | ICD-10-CM | POA: Diagnosis present

## 2020-12-10 DIAGNOSIS — I2510 Atherosclerotic heart disease of native coronary artery without angina pectoris: Secondary | ICD-10-CM | POA: Diagnosis present

## 2020-12-10 DIAGNOSIS — I5023 Acute on chronic systolic (congestive) heart failure: Secondary | ICD-10-CM | POA: Diagnosis present

## 2020-12-10 DIAGNOSIS — Z452 Encounter for adjustment and management of vascular access device: Secondary | ICD-10-CM | POA: Diagnosis not present

## 2020-12-10 DIAGNOSIS — J9601 Acute respiratory failure with hypoxia: Secondary | ICD-10-CM

## 2020-12-10 DIAGNOSIS — F0390 Unspecified dementia without behavioral disturbance: Secondary | ICD-10-CM | POA: Diagnosis present

## 2020-12-10 DIAGNOSIS — Z951 Presence of aortocoronary bypass graft: Secondary | ICD-10-CM | POA: Diagnosis not present

## 2020-12-10 DIAGNOSIS — J9600 Acute respiratory failure, unspecified whether with hypoxia or hypercapnia: Secondary | ICD-10-CM | POA: Diagnosis not present

## 2020-12-10 DIAGNOSIS — N182 Chronic kidney disease, stage 2 (mild): Secondary | ICD-10-CM | POA: Diagnosis present

## 2020-12-10 DIAGNOSIS — I69354 Hemiplegia and hemiparesis following cerebral infarction affecting left non-dominant side: Secondary | ICD-10-CM | POA: Diagnosis not present

## 2020-12-10 LAB — BASIC METABOLIC PANEL
Anion gap: 12 (ref 5–15)
BUN: 56 mg/dL — ABNORMAL HIGH (ref 8–23)
CO2: 18 mmol/L — ABNORMAL LOW (ref 22–32)
Calcium: 8.1 mg/dL — ABNORMAL LOW (ref 8.9–10.3)
Chloride: 109 mmol/L (ref 98–111)
Creatinine, Ser: 2.41 mg/dL — ABNORMAL HIGH (ref 0.61–1.24)
GFR, Estimated: 29 mL/min — ABNORMAL LOW (ref >60)
Glucose, Bld: 235 mg/dL — ABNORMAL HIGH (ref 70–99)
Potassium: 4.5 mmol/L (ref 3.5–5.1)
Sodium: 139 mmol/L (ref 135–145)

## 2020-12-10 LAB — CBC
HCT: 32.3 % — ABNORMAL LOW (ref 39.0–52.0)
Hemoglobin: 9.7 g/dL — ABNORMAL LOW (ref 13.0–17.0)
MCH: 31.4 pg (ref 26.0–34.0)
MCHC: 30 g/dL (ref 30.0–36.0)
MCV: 104.5 fL — ABNORMAL HIGH (ref 80.0–100.0)
Platelets: 271 10*3/uL (ref 150–400)
RBC: 3.09 MIL/uL — ABNORMAL LOW (ref 4.22–5.81)
RDW: 15.9 % — ABNORMAL HIGH (ref 11.5–15.5)
WBC: 23.8 10*3/uL — ABNORMAL HIGH (ref 4.0–10.5)
nRBC: 0 % (ref 0.0–0.2)

## 2020-12-10 LAB — ECHOCARDIOGRAM COMPLETE
Calc EF: 35.6 %
Height: 67 in
S' Lateral: 4.2 cm
Single Plane A2C EF: 32.7 %
Single Plane A4C EF: 35.8 %
Weight: 3890.68 oz

## 2020-12-10 LAB — PHOSPHORUS
Phosphorus: 3.2 mg/dL (ref 2.5–4.6)
Phosphorus: 2.7 mg/dL (ref 2.5–4.6)

## 2020-12-10 LAB — GLUCOSE, CAPILLARY
Glucose-Capillary: 209 mg/dL — ABNORMAL HIGH (ref 70–99)
Glucose-Capillary: 171 mg/dL — ABNORMAL HIGH (ref 70–99)
Glucose-Capillary: 144 mg/dL — ABNORMAL HIGH (ref 70–99)
Glucose-Capillary: 180 mg/dL — ABNORMAL HIGH (ref 70–99)
Glucose-Capillary: 208 mg/dL — ABNORMAL HIGH (ref 70–99)
Glucose-Capillary: 217 mg/dL — ABNORMAL HIGH (ref 70–99)

## 2020-12-10 LAB — HEPARIN LEVEL (UNFRACTIONATED): Heparin Unfractionated: >2.2 IU/mL — ABNORMAL HIGH (ref 0.30–0.70)

## 2020-12-10 LAB — MAGNESIUM
Magnesium: 1.3 mg/dL — ABNORMAL LOW (ref 1.7–2.4)
Magnesium: 2.1 mg/dL (ref 1.7–2.4)
Magnesium: 1.6 mg/dL — ABNORMAL LOW (ref 1.7–2.4)

## 2020-12-10 LAB — APTT: aPTT: 41 seconds — ABNORMAL HIGH (ref 24–36)

## 2020-12-10 LAB — LACTIC ACID, PLASMA: Lactic Acid, Venous: 3.7 mmol/L (ref 0.5–1.9)

## 2020-12-10 LAB — PROTIME-INR
INR: 2.2 — ABNORMAL HIGH (ref 0.8–1.2)
Prothrombin Time: 23.8 seconds — ABNORMAL HIGH (ref 11.4–15.2)

## 2020-12-10 LAB — STREP PNEUMONIAE URINARY ANTIGEN: Strep Pneumo Urinary Antigen: NEGATIVE

## 2020-12-10 MED ORDER — MAGNESIUM SULFATE 2 GM/50ML IV SOLN
2.0000 g | Freq: Once | INTRAVENOUS | Status: AC
  Administered 2020-12-10: 2 g via INTRAVENOUS
  Filled 2020-12-10: qty 50

## 2020-12-10 MED ORDER — INSULIN ASPART 100 UNIT/ML ~~LOC~~ SOLN
0.0000 [IU] | SUBCUTANEOUS | Status: DC
  Administered 2020-12-10 (×2): 5 [IU] via SUBCUTANEOUS
  Administered 2020-12-10: 3 [IU] via SUBCUTANEOUS
  Administered 2020-12-11: 8 [IU] via SUBCUTANEOUS
  Administered 2020-12-11: 3 [IU] via SUBCUTANEOUS
  Administered 2020-12-11: 5 [IU] via SUBCUTANEOUS
  Administered 2020-12-12: 2 [IU] via SUBCUTANEOUS

## 2020-12-10 MED ORDER — VITAL 1.5 CAL PO LIQD
1000.0000 mL | ORAL | Status: DC
  Administered 2020-12-10 – 2020-12-11 (×2): 1000 mL
  Filled 2020-12-10: qty 1000

## 2020-12-10 MED ORDER — AMIODARONE HCL 200 MG PO TABS
200.0000 mg | ORAL_TABLET | Freq: Every day | ORAL | Status: DC
  Administered 2020-12-10 – 2020-12-12 (×3): 200 mg
  Filled 2020-12-10 (×3): qty 1

## 2020-12-10 MED ORDER — PROSOURCE TF PO LIQD
90.0000 mL | Freq: Two times a day (BID) | ORAL | Status: DC
  Administered 2020-12-10 – 2020-12-11 (×2): 90 mL
  Filled 2020-12-10 (×2): qty 90

## 2020-12-10 MED ORDER — VITAL HIGH PROTEIN PO LIQD: 1000.0000 mL | ORAL | Status: DC

## 2020-12-10 MED ORDER — MAGNESIUM SULFATE IN D5W 1-5 GM/100ML-% IV SOLN
1.0000 g | Freq: Once | INTRAVENOUS | Status: AC
  Administered 2020-12-10: 1 g via INTRAVENOUS
  Filled 2020-12-10: qty 100

## 2020-12-10 MED ORDER — INSULIN DETEMIR 100 UNIT/ML ~~LOC~~ SOLN
5.0000 [IU] | Freq: Two times a day (BID) | SUBCUTANEOUS | Status: DC
  Administered 2020-12-10 – 2020-12-12 (×5): 5 [IU] via SUBCUTANEOUS
  Filled 2020-12-10 (×8): qty 0.05

## 2020-12-10 MED ORDER — MIDAZOLAM HCL 2 MG/2ML IJ SOLN
2.0000 mg | Freq: Four times a day (QID) | INTRAMUSCULAR | Status: DC | PRN
  Administered 2020-12-10 (×2): 2 mg via INTRAVENOUS
  Administered 2020-12-10: 1 mg via INTRAVENOUS
  Filled 2020-12-10 (×3): qty 2

## 2020-12-10 MED ORDER — PROSOURCE TF PO LIQD
45.0000 mL | Freq: Two times a day (BID) | ORAL | Status: DC
  Administered 2020-12-10: 45 mL
  Filled 2020-12-10: qty 45

## 2020-12-10 MED ORDER — PERFLUTREN LIPID MICROSPHERE
1.0000 mL | INTRAVENOUS | Status: AC | PRN
  Administered 2020-12-10: 2 mL via INTRAVENOUS
  Filled 2020-12-10: qty 10

## 2020-12-10 MED ORDER — MIDAZOLAM HCL 2 MG/2ML IJ SOLN
2.0000 mg | INTRAMUSCULAR | Status: DC | PRN
  Administered 2020-12-10: 2 mg via INTRAVENOUS
  Filled 2020-12-10: qty 2

## 2020-12-10 NOTE — Progress Notes (Signed)
[place] Progress Note Patient Name: [name] DOB: 07-Jun-1951 MRN: [id number]   Date of Service  12/10/2020  HPI/Events of Note  Agitiation - Currently on a Fentanyl IV infusion with Fentanyl IV and Versed IV PRN  eICU Interventions  Plan: 1. Increase the Versed to 2 mg IV Q 4 hours PRN.     Intervention Category Major Interventions: Delirium, psychosis, severe agitation - evaluation and management  Jara Feider [place] 12/10/2020, 9:11 PM
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
## 2020-12-10 NOTE — Progress Notes (Deleted)
POST OPERATIVE OFFICE NOTE    CC:  F/u for surgery  HPI:  This is a 70 y.o. male who is s/p Left AKA by Dr. Stanford Breed on 11/11/20.  He is here today for exam and possible suture/staple removal.  He denise fever, chills or incisional drainage.  He was first seen at [place] after transfer from [facility] with sepsis indicators including tachycardia and leukocytosis. He was found to have left foot wound, pneumonia and UTI.  He had rest pain and no Doppler signals in the left foot.  He was given the option of left AKA should his left foot pain He had been undergoing wound care for approximately1 month at the wound care center.    Last seen in our office on 08/03/2019 for known right internal carotid artery occlusion.Dr. Lorelee Market following a moderate left carotid stenosis. He also has peripheral vascular disease with multilevel arterial occlusive disease.  At the time of that encounter, the patient had a small left heel ulcer and ulceration of his first and second left toes.  The patient is nonambulatory secondary to previous stroke and left hemiparesis.   He is currently on Lipitor and Plavix daily.   Allergies  Allergen Reactions  . Flexeril [Cyclobenzaprine] Shortness Of Breath  . Tamsulosin Hcl Swelling, Rash and Other (See Comments)    Swelling around the mouth with rash on face Swelling around the mouth with rash on face Swelling around the mouth with rash on face  Swelling around the mouth with rash on face  . Tramadol Other (See Comments)    Headache  . Robaxin [Methocarbamol] Rash  . Aspirin     On plavix  . Morphine And Related Itching    No current facility-administered medications for this visit.   No current outpatient medications on file.   Facility-Administered Medications Ordered in Other Visits  Medication Dose Route Frequency Provider Last Rate Last Admin  . amiodarone (PACERONE) tablet 200 mg  200 mg Per Tube Daily Jennelle Human B, NP      .  ceFEPIme (MAXIPIME) 2 g in sodium chloride 0.9 % 100 mL IVPB  2 g Intravenous Q12H Horton, Kristie M, DO   Stopped at 12/10/20 0534  . chlorhexidine gluconate (MEDLINE KIT) (PERIDEX) 0.12 % solution 15 mL  15 mL Mouth Rinse BID Judd Lien, MD   15 mL at 12/10/20 0750  . Chlorhexidine Gluconate Cloth 2 % PADS 6 each  6 each Topical Q0600 Judd Lien, MD   6 each at 12/10/20 0504  . feeding supplement (PROSource TF) liquid 45 mL  45 mL Per Tube BID Jennelle Human B, NP      . feeding supplement (VITAL HIGH PROTEIN) liquid 1,000 mL  1,000 mL Per Tube Q24H Jennelle Human B, NP      . fentaNYL (SUBLIMAZE) injection 25 mcg  25 mcg Intravenous Q2H PRN Judd Lien, MD   25 mcg at 12/10/20 0759  . fentaNYL 2515mg in NS 2549m(1045mml) infusion-PREMIX  25-200 mcg/hr Intravenous Continuous AveEliezer Bottom MD 10 mL/hr at 12/10/20 0600 100 mcg/hr at 12/10/20 0600  . insulin aspart (novoLOG) injection 0-15 Units  0-15 Units Subcutaneous Q4H SimJennelle Human NP      . magnesium sulfate IVPB 1 g 100 mL  1 g Intravenous Once SimJennelle Human NP 100 mL/hr at 12/10/20 0853 1 g at 12/10/20 0853  . MEDLINE mouth rinse  15 mL Mouth Rinse 10 times per day AveEliezer Bottom  T, MD   15 mL at 12/10/20 0603  . midazolam (VERSED) injection 2 mg  2 mg Intravenous Q6H PRN Judd Lien, MD   1 mg at 12/10/20 0416  . norepinephrine (LEVOPHED) 16 mg in 241m premix infusion  0-40 mcg/min Intravenous Titrated AEliezer BottomT, MD 22.5 mL/hr at 12/10/20 0600 24 mcg/min at 12/10/20 0600  . pantoprazole (PROTONIX) injection 40 mg  40 mg Intravenous Daily DDeland Pretty MD      . sodium chloride flush (NS) 0.9 % injection 10-40 mL  10-40 mL Intracatheter Q12H DDeland Pretty MD   10 mL at 12/09/20 2230  . sodium chloride flush (NS) 0.9 % injection 10-40 mL  10-40 mL Intracatheter PRN DDeland Pretty MD      . [Derrill MemoON 12/11/2020] vancomycin (VANCOREADY) IVPB 1500 mg/300 mL  1,500 mg  Intravenous Q48H Horton, Kristie M, DO      . vasopressin (PITRESSIN) 20 Units in sodium chloride 0.9 % 100 mL infusion-*FOR SHOCK*  0-0.03 Units/min Intravenous Continuous DDeland Pretty MD 9 mL/hr at 12/10/20 0608 0.03 Units/min at 12/10/20 0608     ROS:  See HPI  Physical Exam:  ***  Incision:  *** Extremities:  *** Neuro: *** Abdomen:  ***   Assessment/Plan:  This is a 70y.o. male who is s/p: Left AKA  Diffuse disease throughout the right SFA with maximal stenosis approaching 50-70%. Patent stent at the level of the mid to distal SFA. Continuously patent posterior tibial runoff on the right and diseased anterior tibial and peroneal runoff.  Aortic atherosclerosis.  Carotid duplex on 11/23/2018: right ICA occlusion; left ICA stenosis 40-59%  He will be scheduled for repeat surveillance of his Carotids and ABI of the right LE in 3 months.   He is followed by Dr. DScot Dock  ERoxy HorsemanPA-C [facility] [phone]  Clinic MD:  CCarlis Abbott

## 2020-12-10 NOTE — Progress Notes (Signed)
[place] Progress Note Patient Name: [name] DOB: 04-26-1951 MRN: [id number]   Date of Service  12/10/2020  HPI/Events of Note  Notified of Mg 1.3 Creatinine 2.41, K 4.5  eICU Interventions  Ordered magnesium sulfate 1 g     Intervention Category Major Interventions: Electrolyte abnormality - evaluation and management  Judd Lien 12/10/2020, 6:51 AM

## 2020-12-10 NOTE — Progress Notes (Signed)
  Echocardiogram 2D Echocardiogram has been performed.  Fidel Levy 12/10/2020, 11:14 AM

## 2020-12-10 NOTE — Progress Notes (Addendum)
Initial Nutrition Assessment  RD working remotely.  DOCUMENTATION CODES:   Obesity unspecified  INTERVENTION:   ADDENDUM (1212): RN advanced OG tube and KUB obtained. Per x-ray reading, OG tube in duodenum.  Once gastric placement of OG is confirmed, initiate tube feeds: - Vital 1.5 @ 55 ml/hr (1320 ml/day) - ProSource TF 90 ml BID  Tube feeding regimen provides 2140 kcal, 133 grams of protein, and 1008 ml of H2O (meets 100% of estimated needs).  NUTRITION DIAGNOSIS:   Inadequate oral intake related to inability to eat as evidenced by NPO status.  GOAL:   Patient will meet greater than or equal to 90% of their needs  MONITOR:   Vent status,Labs,Weight trends,TF tolerance,I & O's  REASON FOR ASSESSMENT:   Ventilator,Consult Enteral/tube feeding initiation and management  ASSESSMENT:   70 year old male who presented to the ED from SNF on 2/28 with AMS, hypoxia. PMH of HTN, HLD, T2DM, diabetic food ulcer s/p left AKA on 11/11/20, OSA, CHF, CAD, SDH with TBI, R MCA stroke, recent COVID-19 infection (diagnosed 2/7). Pt intubated in the ED and admitted with septic shock 2/2 HCAP.   RD consulted for tube feeding initiation and management. OG tube in proximal stomach with side port at the GE junction per x-ray reading on 2/28. Discussed with RN and CCM NP. Plan is to advance OG tube and obtain KUB to confirm placement.  Per notes, weaning pressors.  Patient is currently intubated on ventilator support MV: 10.6 L/min Temp (24hrs), Avg:99.1 F (37.3 C), Min:97.5 F (36.4 C), Max:100.1 F (37.8 C) BP (cuff): 93/63 MAP (cuff): 72  Drips: Fentanyl Levophed Vasopressin  Medications reviewed and include: SSI q 4 hours, IV protonix, IV abx, IV magnesium sulfate 1 gram once  Labs reviewed: BUN 56, creatinine 2.41, magnesium 1.3, hemoglobin 9.7 CBG's: 144-273 x 24 hours  UOP: 580 ml x 24 hours OGT output: 140 ml x 24 hours I/O's: +6.3 L since admit  NUTRITION - FOCUSED  PHYSICAL EXAM:  Unable to complete at this time. RD working remotely.  Diet Order:   Diet Order            Diet NPO time specified  Diet effective now                 EDUCATION NEEDS:   No education needs have been identified at this time  Skin:  Skin Assessment: Skin Integrity Issues: Incisions: closed left leg s/p AKA  Last BM:  no documented BM  Height:   Ht Readings from Last 1 Encounters:  12/09/20 5\' 7"  (1.702 m)    Weight:   Wt Readings from Last 1 Encounters:  12/09/20 110.3 kg    BMI:  Body mass index is 38.09 kg/m.  Estimated Nutritional Needs:   Kcal:  2000-2200  Protein:  125-145 grams  Fluid:  >/= 1.8 L    Gustavus Bryant, MS, RD, LDN Inpatient Clinical Dietitian Please see AMiON for contact information.

## 2020-12-10 NOTE — Progress Notes (Signed)
Pt arrived to 3M09 at [place] via [facility]. Pt drowsy but does grip with R hand on command. Pt is vented and on levophed 20 mcg/min . Pt is RASS -1 and fighting ventilator. [place] notified. RN requested sedation and central line placement.

## 2020-12-10 NOTE — Plan of Care (Signed)
  Problem: Safety: Goal: Non-violent Restraint(s) Outcome: Progressing   Problem: Education: Goal: Knowledge of General Education information will improve Description: Including pain rating scale, medication(s)/side effects and non-pharmacologic comfort measures Outcome: Progressing   Problem: Health Behavior/Discharge Planning: Goal: Ability to manage health-related needs will improve Outcome: Progressing   Problem: Clinical Measurements: Goal: Ability to maintain clinical measurements within normal limits will improve Outcome: Progressing Goal: Will remain free from infection Outcome: Progressing Goal: Diagnostic test results will improve Outcome: Progressing Goal: Respiratory complications will improve Outcome: Progressing Goal: Cardiovascular complication will be avoided Outcome: Progressing   Problem: Activity: Goal: Risk for activity intolerance will decrease Outcome: Progressing   Problem: Nutrition: Goal: Adequate nutrition will be maintained Outcome: Progressing   Problem: Coping: Goal: Level of anxiety will decrease Outcome: Progressing   Problem: Elimination: Goal: Will not experience complications related to bowel motility Outcome: Progressing Goal: Will not experience complications related to urinary retention Outcome: Progressing   Problem: Pain Managment: Goal: General experience of comfort will improve Outcome: Progressing   Problem: Safety: Goal: Ability to remain free from injury will improve Outcome: Progressing   Problem: Skin Integrity: Goal: Risk for impaired skin integrity will decrease Outcome: Progressing   Problem: Fluid Volume: Goal: Hemodynamic stability will improve Outcome: Progressing   Problem: Clinical Measurements: Goal: Diagnostic test results will improve Outcome: Progressing Goal: Signs and symptoms of infection will decrease Outcome: Progressing   Problem: Respiratory: Goal: Ability to maintain adequate  ventilation will improve Outcome: Progressing

## 2020-12-10 NOTE — Progress Notes (Signed)
OG tube advanced 5 cm per provider order. Awaiting xray to verify placement and start tube feeds.

## 2020-12-11 ENCOUNTER — Inpatient Hospital Stay (HOSPITAL_COMMUNITY): Payer: Medicare Other

## 2020-12-11 DIAGNOSIS — A419 Sepsis, unspecified organism: Secondary | ICD-10-CM

## 2020-12-11 DIAGNOSIS — R6521 Severe sepsis with septic shock: Secondary | ICD-10-CM | POA: Diagnosis not present

## 2020-12-11 DIAGNOSIS — L8990 Pressure ulcer of unspecified site, unspecified stage: Secondary | ICD-10-CM | POA: Insufficient documentation

## 2020-12-11 LAB — GLUCOSE, CAPILLARY
Glucose-Capillary: 104 mg/dL — ABNORMAL HIGH (ref 70–99)
Glucose-Capillary: 114 mg/dL — ABNORMAL HIGH (ref 70–99)
Glucose-Capillary: 115 mg/dL — ABNORMAL HIGH (ref 70–99)
Glucose-Capillary: 136 mg/dL — ABNORMAL HIGH (ref 70–99)
Glucose-Capillary: 153 mg/dL — ABNORMAL HIGH (ref 70–99)
Glucose-Capillary: 212 mg/dL — ABNORMAL HIGH (ref 70–99)
Glucose-Capillary: 258 mg/dL — ABNORMAL HIGH (ref 70–99)

## 2020-12-11 LAB — BASIC METABOLIC PANEL
Anion gap: 8 (ref 5–15)
BUN: 45 mg/dL — ABNORMAL HIGH (ref 8–23)
CO2: 21 mmol/L — ABNORMAL LOW (ref 22–32)
Calcium: 8.1 mg/dL — ABNORMAL LOW (ref 8.9–10.3)
Chloride: 113 mmol/L — ABNORMAL HIGH (ref 98–111)
Creatinine, Ser: 1.28 mg/dL — ABNORMAL HIGH (ref 0.61–1.24)
GFR, Estimated: >60 mL/min (ref >60)
Glucose, Bld: 228 mg/dL — ABNORMAL HIGH (ref 70–99)
Potassium: 3.8 mmol/L (ref 3.5–5.1)
Sodium: 142 mmol/L (ref 135–145)

## 2020-12-11 LAB — HEPATIC FUNCTION PANEL
ALT: 19 U/L (ref 0–44)
AST: 26 U/L (ref 15–41)
Albumin: 2 g/dL — ABNORMAL LOW (ref 3.5–5.0)
Alkaline Phosphatase: 69 U/L (ref 38–126)
Bilirubin, Direct: 0.2 mg/dL (ref 0.0–0.2)
Indirect Bilirubin: 0.6 mg/dL (ref 0.3–0.9)
Total Bilirubin: 0.8 mg/dL (ref 0.3–1.2)
Total Protein: 5.7 g/dL — ABNORMAL LOW (ref 6.5–8.1)

## 2020-12-11 LAB — MAGNESIUM
Magnesium: 2.1 mg/dL (ref 1.7–2.4)
Magnesium: 1.7 mg/dL (ref 1.7–2.4)

## 2020-12-11 LAB — CBC
HCT: 28.4 % — ABNORMAL LOW (ref 39.0–52.0)
Hemoglobin: 8.5 g/dL — ABNORMAL LOW (ref 13.0–17.0)
MCH: 31.3 pg (ref 26.0–34.0)
MCHC: 29.9 g/dL — ABNORMAL LOW (ref 30.0–36.0)
MCV: 104.4 fL — ABNORMAL HIGH (ref 80.0–100.0)
Platelets: 181 10*3/uL (ref 150–400)
RBC: 2.72 MIL/uL — ABNORMAL LOW (ref 4.22–5.81)
RDW: 16.2 % — ABNORMAL HIGH (ref 11.5–15.5)
WBC: 14 10*3/uL — ABNORMAL HIGH (ref 4.0–10.5)
nRBC: 0 % (ref 0.0–0.2)

## 2020-12-11 LAB — PHOSPHORUS
Phosphorus: 2.3 mg/dL — ABNORMAL LOW (ref 2.5–4.6)
Phosphorus: 1.8 mg/dL — ABNORMAL LOW (ref 2.5–4.6)

## 2020-12-11 LAB — APTT: aPTT: 44 seconds — ABNORMAL HIGH (ref 24–36)

## 2020-12-11 LAB — PROTIME-INR
INR: 1.7 — ABNORMAL HIGH (ref 0.8–1.2)
Prothrombin Time: 19.6 seconds — ABNORMAL HIGH (ref 11.4–15.2)

## 2020-12-11 MED ORDER — SODIUM CHLORIDE 0.9 % IV SOLN
2.0000 g | Freq: Three times a day (TID) | INTRAVENOUS | Status: AC
  Administered 2020-12-11 – 2020-12-16 (×17): 2 g via INTRAVENOUS
  Filled 2020-12-11 (×17): qty 2

## 2020-12-11 MED ORDER — ACETAMINOPHEN 650 MG RE SUPP
650.0000 mg | Freq: Four times a day (QID) | RECTAL | Status: DC | PRN
  Administered 2020-12-13: 650 mg via RECTAL
  Filled 2020-12-11 (×2): qty 1

## 2020-12-11 MED ORDER — POTASSIUM PHOSPHATES 15 MMOLE/5ML IV SOLN
30.0000 mmol | Freq: Once | INTRAVENOUS | Status: AC
  Administered 2020-12-11: 30 mmol via INTRAVENOUS
  Filled 2020-12-11: qty 10

## 2020-12-11 MED ORDER — MAGNESIUM SULFATE 2 GM/50ML IV SOLN
2.0000 g | Freq: Once | INTRAVENOUS | Status: AC
  Administered 2020-12-11: 2 g via INTRAVENOUS
  Filled 2020-12-11: qty 50

## 2020-12-11 MED ORDER — VANCOMYCIN HCL 1000 MG/200ML IV SOLN
1000.0000 mg | Freq: Two times a day (BID) | INTRAVENOUS | Status: DC
  Administered 2020-12-11 (×2): 1000 mg via INTRAVENOUS
  Filled 2020-12-11: qty 200

## 2020-12-11 MED ORDER — HEPARIN (PORCINE) 25000 UT/250ML-% IV SOLN
1650.0000 [IU]/h | INTRAVENOUS | Status: DC
  Administered 2020-12-11: 12:00:00 1450 [IU]/h via INTRAVENOUS
  Administered 2020-12-12: 1750 [IU]/h via INTRAVENOUS
  Administered 2020-12-12: 1650 [IU]/h via INTRAVENOUS
  Filled 2020-12-11 (×3): qty 250

## 2020-12-11 MED ORDER — CHLORHEXIDINE GLUCONATE 0.12 % MT SOLN
OROMUCOSAL | Status: AC
  Filled 2020-12-11: qty 15

## 2020-12-11 MED ORDER — FENTANYL BOLUS VIA INFUSION
30.0000 ug | INTRAVENOUS | Status: DC | PRN
Start: 2020-12-11 — End: 2020-12-11
  Administered 2020-12-11 (×2): 30 ug via INTRAVENOUS
  Filled 2020-12-11: qty 30

## 2020-12-11 NOTE — Plan of Care (Signed)
  Problem: Safety: Goal: Non-violent Restraint(s) Outcome: Progressing   Problem: Education: Goal: Knowledge of General Education information will improve Description: Including pain rating scale, medication(s)/side effects and non-pharmacologic comfort measures Outcome: Progressing   Problem: Health Behavior/Discharge Planning: Goal: Ability to manage health-related needs will improve Outcome: Progressing   Problem: Clinical Measurements: Goal: Ability to maintain clinical measurements within normal limits will improve Outcome: Progressing Goal: Will remain free from infection Outcome: Progressing Goal: Diagnostic test results will improve Outcome: Progressing Goal: Respiratory complications will improve Outcome: Progressing Goal: Cardiovascular complication will be avoided Outcome: Progressing   Problem: Activity: Goal: Risk for activity intolerance will decrease Outcome: Progressing   Problem: Nutrition: Goal: Adequate nutrition will be maintained Outcome: Progressing   Problem: Coping: Goal: Level of anxiety will decrease Outcome: Progressing   Problem: Elimination: Goal: Will not experience complications related to bowel motility Outcome: Progressing Goal: Will not experience complications related to urinary retention Outcome: Progressing   Problem: Pain Managment: Goal: General experience of comfort will improve Outcome: Progressing   Problem: Safety: Goal: Ability to remain free from injury will improve Outcome: Progressing   Problem: Skin Integrity: Goal: Risk for impaired skin integrity will decrease Outcome: Progressing   Problem: Fluid Volume: Goal: Hemodynamic stability will improve Outcome: Progressing   Problem: Clinical Measurements: Goal: Diagnostic test results will improve Outcome: Progressing Goal: Signs and symptoms of infection will decrease Outcome: Progressing   Problem: Respiratory: Goal: Ability to maintain adequate  ventilation will improve Outcome: Progressing   Problem: Activity: Goal: Ability to tolerate increased activity will improve Outcome: Progressing   Problem: Respiratory: Goal: Ability to maintain a clear airway and adequate ventilation will improve Outcome: Progressing   Problem: Role Relationship: Goal: Method of communication will improve Outcome: Progressing
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
## 2020-12-11 NOTE — Progress Notes (Signed)
[place] Progress Note Patient Name: [name] DOB: October 21, 1950 MRN: [id number]   Date of Service  12/11/2020  HPI/Events of Note  Patient c/o chronic L arm pain. NPO, no gastric tube and can't get home Lyrica and Neurontin. AST and ALT both normal.  eICU Interventions  Plan: 1. Tylenol suppository 650 mg PR Q 6 hours PRN pain.      Intervention Category Major Interventions: Other:  Lysle Dingwall 12/11/2020, 11:29 PM

## 2020-12-11 NOTE — Progress Notes (Addendum)
Pharmacy Antibiotic Note  [name] is a 70 y.o. male admitted on 12/09/2020 with pneumonia.  Pharmacy has been consulted for Vancomycin and Cefepime dosing.  MRSA PCR negative- Keeping Vanc. Cultures pending.  SCr improving significantly at 1.28, UOP 0.5 mL/kg/hr.  Leukocytosis is improving. Tmax 99.5. Weaning Levophed. Vasopressin is off.  Extubating this AM.   Plan: Increase Vancomycin to 1g IV every 12 hours. Expected AUC 477 Increase Cefepime 2g IV every 8 hours. Monitor labs, c/s, and vanco level as indicated.  Height: 5\' 7"  (170.2 cm) Weight: 110.3 kg (243 lb 2.7 oz) IBW/kg (Calculated) : 66.1  Temp (24hrs), Avg:99 F (37.2 C), Min:97.16 F (36.2 C), Max:99.68 F (37.6 C)  Recent Labs  Lab 12/09/20 1253 12/09/20 1254 12/09/20 1457 12/09/20 2140 12/10/20 0039 12/10/20 0307 12/11/20 0352  WBC 19.1*  --   --   --   --  23.8* 14.0*  CREATININE 2.80*  --   --  2.55*  --  2.41* 1.28*  LATICACIDVEN  --  3.9* 4.6* 3.9* 3.7*  --   --     Estimated Creatinine Clearance: 65.5 mL/min (A) (by C-G formula based on SCr of 1.28 mg/dL (H)).    Allergies  Allergen Reactions  . Flexeril [Cyclobenzaprine] Shortness Of Breath  . Tamsulosin Hcl Swelling, Rash and Other (See Comments)    Swelling around the mouth with rash on face Swelling around the mouth with rash on face Swelling around the mouth with rash on face  Swelling around the mouth with rash on face  . Tramadol Other (See Comments)    Headache  . Robaxin [Methocarbamol] Rash  . Aspirin     On plavix  . Morphine And Related Itching    Antimicrobials this admission: Vanco 2/28 >>  Cefepime 2/28 >>    Microbiology results: 2/28 BCx: pending   2/28 MRSA PCR: negative 3/1 Resp culture: reincubated  Thank you for allowing pharmacy to be a part of this patient's care.  Sloan Leiter, PharmD, BCPS, BCCCP Clinical Pharmacist Please refer to [facility] for [place] numbers 12/11/2020 9:27 AM

## 2020-12-11 NOTE — Procedures (Signed)
Extubation Procedure Note  Patient Details:   Name: [name] DOB: 16-Dec-1950 MRN: [id number]   Airway Documentation:    Vent end date: 12/11/20 Vent end time: 1037   Evaluation  O2 sats: stable throughout Complications: No apparent complications Patient did tolerate procedure well. Bilateral Breath Sounds: Rhonchi   Yes  Ned Grace 12/11/2020, 5:43 PM

## 2020-12-11 NOTE — Progress Notes (Signed)
[place] Progress Note Patient Name: [name] DOB: 04-30-1951 MRN: [id number]   Date of Service  12/11/2020  HPI/Events of Note  Agitation  eICU Interventions  Plan: 1. Increase Fentanyl IV infusion ceiling to 300 mcg/hour. 2. Change PRN Fentanyl to 30 mcg bolus from infusion Q 1 hour PRN pain or agitation.      Intervention Category Major Interventions: Delirium, psychosis, severe agitation - evaluation and management  Sommer,Steven Eugene 12/11/2020, 4:36 AM
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
## 2020-12-12 ENCOUNTER — Inpatient Hospital Stay (HOSPITAL_COMMUNITY): Payer: Medicare Other

## 2020-12-12 DIAGNOSIS — J9601 Acute respiratory failure with hypoxia: Secondary | ICD-10-CM | POA: Diagnosis not present

## 2020-12-12 LAB — APTT
aPTT: 68 seconds — ABNORMAL HIGH (ref 24–36)
aPTT: 130 seconds — ABNORMAL HIGH (ref 24–36)
aPTT: 94 seconds — ABNORMAL HIGH (ref 24–36)

## 2020-12-12 LAB — GLUCOSE, CAPILLARY
Glucose-Capillary: 89 mg/dL (ref 70–99)
Glucose-Capillary: 93 mg/dL (ref 70–99)
Glucose-Capillary: 119 mg/dL — ABNORMAL HIGH (ref 70–99)
Glucose-Capillary: 122 mg/dL — ABNORMAL HIGH (ref 70–99)
Glucose-Capillary: 139 mg/dL — ABNORMAL HIGH (ref 70–99)

## 2020-12-12 LAB — BASIC METABOLIC PANEL
Anion gap: 9 (ref 5–15)
BUN: 31 mg/dL — ABNORMAL HIGH (ref 8–23)
CO2: 20 mmol/L — ABNORMAL LOW (ref 22–32)
Calcium: 8.1 mg/dL — ABNORMAL LOW (ref 8.9–10.3)
Chloride: 115 mmol/L — ABNORMAL HIGH (ref 98–111)
Creatinine, Ser: 0.75 mg/dL (ref 0.61–1.24)
GFR, Estimated: >60 mL/min (ref >60)
Glucose, Bld: 94 mg/dL (ref 70–99)
Potassium: 3.8 mmol/L (ref 3.5–5.1)
Sodium: 144 mmol/L (ref 135–145)

## 2020-12-12 LAB — CULTURE, RESPIRATORY W GRAM STAIN: Culture: NORMAL

## 2020-12-12 LAB — CBC WITH DIFFERENTIAL/PLATELET
Abs Immature Granulocytes: 0.13 10*3/uL — ABNORMAL HIGH (ref 0.00–0.07)
Basophils Absolute: 0.1 10*3/uL (ref 0.0–0.1)
Basophils Relative: 0 %
Eosinophils Absolute: 0.1 10*3/uL (ref 0.0–0.5)
Eosinophils Relative: 1 %
HCT: 26.6 % — ABNORMAL LOW (ref 39.0–52.0)
Hemoglobin: 8 g/dL — ABNORMAL LOW (ref 13.0–17.0)
Immature Granulocytes: 1 %
Lymphocytes Relative: 17 %
Lymphs Abs: 2.1 10*3/uL (ref 0.7–4.0)
MCH: 31.1 pg (ref 26.0–34.0)
MCHC: 30.1 g/dL (ref 30.0–36.0)
MCV: 103.5 fL — ABNORMAL HIGH (ref 80.0–100.0)
Monocytes Absolute: 0.7 10*3/uL (ref 0.1–1.0)
Monocytes Relative: 5 %
Neutro Abs: 9.4 10*3/uL — ABNORMAL HIGH (ref 1.7–7.7)
Neutrophils Relative %: 76 %
Platelets: 142 10*3/uL — ABNORMAL LOW (ref 150–400)
RBC: 2.57 MIL/uL — ABNORMAL LOW (ref 4.22–5.81)
RDW: 16.2 % — ABNORMAL HIGH (ref 11.5–15.5)
WBC: 12.4 10*3/uL — ABNORMAL HIGH (ref 4.0–10.5)
nRBC: 0 % (ref 0.0–0.2)

## 2020-12-12 LAB — MAGNESIUM: Magnesium: 1.9 mg/dL (ref 1.7–2.4)

## 2020-12-12 LAB — HEPARIN LEVEL (UNFRACTIONATED): Heparin Unfractionated: 1.86 IU/mL — ABNORMAL HIGH (ref 0.30–0.70)

## 2020-12-12 LAB — PHOSPHORUS: Phosphorus: 3.2 mg/dL (ref 2.5–4.6)

## 2020-12-12 MED ORDER — INSULIN ASPART 100 UNIT/ML ~~LOC~~ SOLN: 0.0000 [IU] | Freq: Every day | SUBCUTANEOUS | Status: DC

## 2020-12-12 MED ORDER — INSULIN ASPART 100 UNIT/ML ~~LOC~~ SOLN
0.0000 [IU] | Freq: Three times a day (TID) | SUBCUTANEOUS | Status: DC
  Administered 2020-12-13 (×2): 3 [IU] via SUBCUTANEOUS
  Administered 2020-12-14: 5 [IU] via SUBCUTANEOUS
  Administered 2020-12-14: 3 [IU] via SUBCUTANEOUS
  Administered 2020-12-14: 5 [IU] via SUBCUTANEOUS
  Administered 2020-12-15: 3 [IU] via SUBCUTANEOUS
  Administered 2020-12-15 – 2020-12-16 (×2): 2 [IU] via SUBCUTANEOUS
  Administered 2020-12-17: 5 [IU] via SUBCUTANEOUS
  Administered 2020-12-18: 2 [IU] via SUBCUTANEOUS
  Administered 2020-12-18: 3 [IU] via SUBCUTANEOUS

## 2020-12-12 MED ORDER — RESOURCE THICKENUP CLEAR PO POWD
ORAL | Status: DC | PRN
  Filled 2020-12-12 (×2): qty 125

## 2020-12-12 MED ORDER — GERHARDT'S BUTT CREAM
TOPICAL_CREAM | Freq: Two times a day (BID) | CUTANEOUS | Status: DC
  Filled 2020-12-12: qty 1

## 2020-12-12 NOTE — Progress Notes (Signed)
Modified Barium Swallow Progress Note  Patient Details  Name: [name] MRN: [id number] Date of Birth: 06-11-1951  Today's Date: 12/12/2020  Modified Barium Swallow completed.  Full report located under Chart Review in the Imaging Section.  Brief recommendations include the following:  Clinical Impression  Pt presents with oropharyngeal dysphagia characterized by impaired posterior bolus propulsion, delayed oral transit, prolonged mastication, mildly reduced lingual retraction, and a pharyngeal delay. He demonstrated lingual pumping during attempts at A-P transport, mild vallecular residue, penetration (PAS 2, 3, 5) of thin and nectar thick liquids via straw, and sensed aspiration (PAS 7) of thin liquids. Coughing was effective in expelling some of the aspirate. Pt exhibited some difficulty demonstrating compensatory strategies but theses were ineffecting in eliminating aspiration of thin liquids. Pt's independent use of secondary swallows eliminated pharyngeal residue. A dysphagia 2 diet with nectar thick liquids is recommended at this time. SLP will follow for dysphagia treatment.   Swallow Evaluation Recommendations       SLP Diet Recommendations: Dysphagia 2 (Fine chop) solids;Nectar thick liquid   Liquid Administration via: Cup;No straw   Medication Administration: Whole meds with puree   Supervision: Staff to assist with self feeding   Compensations: Slow rate;Small sips/bites   Postural Changes: Remain semi-upright after after feeds/meals (Comment)   Oral Care Recommendations: Oral care BID       Shelina Luo I. Hardin Negus, [place], [place] Office number [phone] Pager [place] 12/12/2020,11:59 AM

## 2020-12-12 NOTE — Progress Notes (Signed)
ANTICOAGULATION CONSULT NOTE - Follow Up Consult  Pharmacy Consult for Heparin Indication: atrial fibrillation  Allergies  Allergen Reactions  . Flexeril [Cyclobenzaprine] Shortness Of Breath  . Tamsulosin Hcl Swelling, Rash and Other (See Comments)    Swelling around the mouth with rash on face Swelling around the mouth with rash on face Swelling around the mouth with rash on face  Swelling around the mouth with rash on face  . Tramadol Other (See Comments)    Headache  . Robaxin [Methocarbamol] Rash  . Aspirin     On plavix  . Morphine And Related Itching    Patient Measurements: Height: 5\' 7"  (170.2 cm) Weight: 110.3 kg (243 lb 2.7 oz) IBW/kg (Calculated) : 66.1 Heparin Dosing Weight:    Vital Signs: Temp: 98.3 F (36.8 C) (03/03 1946) Temp Source: Axillary (03/03 1946) BP: 109/53 (03/03 1946) Pulse Rate: 100 (03/03 1946)  Labs: Recent Labs    12/10/20 0307 12/10/20 0842 12/11/20 0352 12/11/20 1730 12/12/20 0411 12/12/20 1126 12/12/20 1900  HGB 9.7*  --  8.5*  --  8.0*  --   --   HCT 32.3*  --  28.4*  --  26.6*  --   --   PLT 271  --  181  --  142*  --   --   APTT  --  41*  --    < > 68* 130* 94*  LABPROT  --  23.8* 19.6*  --   --   --   --   INR  --  2.2* 1.7*  --   --   --   --   HEPARINUNFRC  --  >2.20*  --   --  1.86*  --   --   CREATININE 2.41*  --  1.28*  --  0.75  --   --    < > = values in this interval not displayed.    Estimated Creatinine Clearance: 104.8 mL/min (by C-G formula based on SCr of 0.75 mg/dL).    Assessment: Heparin for afib. APTT 94 in goal range.  Goal of Therapy:  aPTT 66-102 seconds Monitor platelets by anticoagulation protocol: Yes   Plan:  Con't IV heparin at 1650 units/hr Daily Hep level, aPTT, and CBC in AM.   Crystal S. Alford Highland, PharmD, BCPS Clinical Staff Pharmacist [email or web address] Eilene Ghazi [place] 12/12/2020,8:02 PM

## 2020-12-12 NOTE — Progress Notes (Signed)
ANTICOAGULATION CONSULT NOTE - Follow Up Consult  Pharmacy Consult for IV Heparin Indication: atrial fibrillation  Allergies  Allergen Reactions  . Flexeril [Cyclobenzaprine] Shortness Of Breath  . Tamsulosin Hcl Swelling, Rash and Other (See Comments)    Swelling around the mouth with rash on face Swelling around the mouth with rash on face Swelling around the mouth with rash on face  Swelling around the mouth with rash on face  . Tramadol Other (See Comments)    Headache  . Robaxin [Methocarbamol] Rash  . Aspirin     On plavix  . Morphine And Related Itching    Patient Measurements: Height: 5\' 7"  (170.2 cm) Weight: 110.3 kg (243 lb 2.7 oz) IBW/kg (Calculated) : 66.1 Heparin Dosing Weight: 90.9 kg  Vital Signs: Temp: 98 F (36.7 C) (03/03 1100) Temp Source: Oral (03/03 1100) BP: 111/71 (03/03 1100) Pulse Rate: 113 (03/03 1100)  Labs: Recent Labs    12/09/20 1253 12/09/20 1457 12/09/20 2140 12/10/20 0307 12/10/20 0307 12/10/20 0842 12/11/20 0352 12/11/20 1730 12/12/20 0411 12/12/20 1126  HGB 10.9*  --    < > 9.7*  --   --  8.5*  --  8.0*  --   HCT 35.4*  --    < > 32.3*  --   --  28.4*  --  26.6*  --   PLT 221  --   --  271  --   --  181  --  142*  --   APTT  --   --   --   --    < > 41*  --  44* 68* 130*  LABPROT 24.8*  --   --   --   --  23.8* 19.6*  --   --   --   INR 2.3*  --   --   --   --  2.2* 1.7*  --   --   --   HEPARINUNFRC  --   --   --   --   --  >2.20*  --   --  1.86*  --   CREATININE 2.80*  --    < > 2.41*  --   --  1.28*  --  0.75  --   TROPONINIHS 19* 19*  --   --   --   --   --   --   --   --    < > = values in this interval not displayed.    Estimated Creatinine Clearance: 104.8 mL/min (by C-G formula based on SCr of 0.75 mg/dL).   Assessment: 70 yr old male on apixaban prior to admission for atrial fibrillation  was admitted for altered mental status, respiratory failure and HAP. Patient required intubation and apixaban was held -  last dose was on 2/28 at 0800 AM. Initially, patient showed signs of coagulopathy with slightly prolonged baseline aPTT at 41 and INR up to 2.3. Today, INR down to 1.7. Pharmacy was consulted to start IV heparin therapy while apixaban is on hold. Given recent apixaban exposure, will monitor anticoagulation using aPTT until aPTT and heparin levels correlate.  PTT is supra therapeutic (130 sec) on gtt at 1750 units/hr. No bleeding noted. Hgb 8, plt down to 142.  Goal of Therapy:  Heparin level 0.3-0.7 units/ml, PTT 66-102 sec Monitor platelets by anticoagulation protocol: Yes   Plan:  Decrease heparin infusion at 1650 units/hr  F/u 6 hr confirmatory PTT  Alanda Slim, PharmD, [facility] Clinical Pharmacist Please see [place]  for all Pharmacists' Contact Phone Numbers 12/12/2020, 12:24 PM

## 2020-12-12 NOTE — Progress Notes (Signed)
Patient arrived to unit. Vitals stable, oriented x2, wife at bedside, MD aware. Will continue to monitor.

## 2020-12-12 NOTE — Progress Notes (Signed)
ANTICOAGULATION CONSULT NOTE - Follow Up Consult  Pharmacy Consult for IV Heparin Indication: atrial fibrillation  Allergies  Allergen Reactions  . Flexeril [Cyclobenzaprine] Shortness Of Breath  . Tamsulosin Hcl Swelling, Rash and Other (See Comments)    Swelling around the mouth with rash on face Swelling around the mouth with rash on face Swelling around the mouth with rash on face  Swelling around the mouth with rash on face  . Tramadol Other (See Comments)    Headache  . Robaxin [Methocarbamol] Rash  . Aspirin     On plavix  . Morphine And Related Itching    Patient Measurements: Height: 5\' 7"  (170.2 cm) Weight: 110.3 kg (243 lb 2.7 oz) IBW/kg (Calculated) : 66.1 Heparin Dosing Weight: 90.9 kg  Vital Signs: Temp: 98.6 F (37 C) (03/03 0400) Temp Source: Oral (03/03 0400) BP: 104/65 (03/03 0445) Pulse Rate: 103 (03/03 0445)  Labs: Recent Labs    12/09/20 1253 12/09/20 1457 12/09/20 2140 12/09/20 2155 12/10/20 0307 12/10/20 0842 12/11/20 0352 12/11/20 1730 12/12/20 0411  HGB 10.9*  --   --    < > 9.7*  --  8.5*  --  8.0*  HCT 35.4*  --   --    < > 32.3*  --  28.4*  --  26.6*  PLT 221  --   --   --  271  --  181  --  142*  APTT  --   --   --   --   --  41*  --  44* 68*  LABPROT 24.8*  --   --   --   --  23.8* 19.6*  --   --   INR 2.3*  --   --   --   --  2.2* 1.7*  --   --   HEPARINUNFRC  --   --   --   --   --  >2.20*  --   --   --   CREATININE 2.80*  --  2.55*  --  2.41*  --  1.28*  --   --   TROPONINIHS 19* 19*  --   --   --   --   --   --   --    < > = values in this interval not displayed.    Estimated Creatinine Clearance: 65.5 mL/min (A) (by C-G formula based on SCr of 1.28 mg/dL (H)).   Assessment: 70 yr old male on apixaban prior to admission for atrial fibrillation  was admitted for altered mental status, respiratory failure and HAP. Patient required intubation and apixaban was held - last dose was on 2/28 at 0800 AM. Initially, patient  showed signs of coagulopathy with slightly prolonged baseline aPTT at 41 and INR up to 2.3. Today, INR down to 1.7. Pharmacy was consulted to start IV heparin therapy while apixaban is on hold. Given recent apixaban exposure, will monitor anticoagulation using aPTT until aPTT and heparin levels correlate.  PTT is therapeutic (68 sec) on gtt at 1750 units/hr. No bleeding noted. Hgb 8, plt down to 142.  Goal of Therapy:  Heparin level 0.3-0.7 units/ml, PTT 66-102 sec Monitor platelets by anticoagulation protocol: Yes   Plan:  Continue heparin infusion at 1750 units/hr  F/u 6 hr confirmatory PTT  Sherlon Handing, PharmD, BCPS Please see amion for complete clinical pharmacist phone list 12/12/2020,5:12 AM

## 2020-12-12 NOTE — Progress Notes (Signed)
[facility] Progress Note Patient Name: NARAYAN SCULL DOB: 1951/01/28 MRN: [id number]   Date of Service  12/12/2020  HPI/Events of Note  Patient tolerating PO diet.   eICU Interventions  Will change to AC/HS moderate Novolog SSI.      Intervention Category Major Interventions: Hyperglycemia - active titration of insulin therapy  Lysle Dingwall 12/12/2020, 11:32 PM

## 2020-12-12 NOTE — Progress Notes (Signed)
Occupational Therapy Evaluation  Pt admitted form SNF where he was undergoing rehab s/p L AKA. Pt overall total A for LB ADL tasks and max A +2 for mobility to EOB. Pt states he does not want to return to [facility] rehab. Per nsg, wife wants to take him home. Pt would benefit from rehab at SNF, however if wife wants to take him home he will need 24/7 assistance with equipment listed below. Will follow acutely.    12/12/20 1400  OT Visit Information  Last OT Received On 12/12/20  Assistance Needed +2  PT/OT/SLP Co-Evaluation/Treatment Yes  Reason for Co-Treatment Complexity of the patient's impairments (multi-system involvement);For patient/therapist safety;To address functional/ADL transfers  OT goals addressed during session ADL's and self-care  History of Present Illness 70 yo admitted from [facility] 8/41 with sepsis and respiratory failure. Intubated 2/28 - 3/2. 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  Precautions  Precautions Fall;Other (comment)  Precaution Comments LUE hemiparesis w/ contracture, Lt AKA  Home Living  Family/patient expects to be discharged to: Skilled nursing facility  Prior Function  Level of [place] Needs assistance  Gait / Transfers Assistance Needed pt has been max to total assist for transfers since amputation and reports not mobilizing at all OOB since D/C to SNF  ADL's / [place] assist for all ADLS from staff  Communication  Communication Other (comment) (very soft voice; difficult tounderstand at times)  Pain Assessment  Pain Assessment Faces  Faces Pain Scale 2  Pain Location generalized  Pain Descriptors / Indicators Aching;Discomfort  Pain Intervention(s) Limited activity within patient's tolerance  Cognition  Arousal/Alertness Awake/alert  Behavior During Therapy Flat affect  Overall Cognitive Status No family/caregiver present to  determine baseline cognitive functioning  Area of Impairment Attention;Memory;Following commands;Safety/judgement;Awareness;Problem solving  Current Attention Level Sustained  Memory Decreased short-term memory  Following Commands Follows one step commands inconsistently;Follows one step commands with increased time  Safety/Judgement Decreased awareness of safety;Decreased awareness of deficits  Awareness Intellectual  Problem Solving Difficulty sequencing;Requires verbal cues  General Comments decreased attention and memory unable to recall how long he has been non ambulatory and unable to recall name of SNF  Upper Extremity Assessment  LUE Deficits / Details Lt wrist and hand in flexed position with moderate spasticity noted. Marland Kitchen  He reports Lt UE is at baseline (May benefit form palm guard)  Lower Extremity Assessment  Lower Extremity Assessment LLE deficits/detail (AKA)  Cervical / Trunk Assessment  Cervical / Trunk Assessment Kyphotic  ADL  Overall ADL's  Needs assistance/impaired  Eating/Feeding NPO  Grooming Minimal assistance;Bed level  Upper Body Bathing Moderate assistance;Bed level  Lower Body Bathing Total assistance;Bed level  Upper Body Dressing  Maximal assistance;Bed level  Lower Body Dressing Total assistance;Bed level  Functional mobility during ADLs  (Max A +2 for bed mobility)  Bed Mobility  Overal bed mobility Needs Assistance  Bed Mobility Supine to Sit;Sit to Supine  Rolling +2 for physical assistance;Mod assist  Sidelying to sit Max assist;+2 for physical assistance  Supine to sit Max assist;+2 for safety/equipment  General bed mobility comments physical assist to roll and transition to sitting toward left with use of left rail, assist at pad to pivot hips and elevate trunk. Max assist to scoot to EOB. Max assist to bring leg back onto surface and control trunk with +2 for safety. Pt able to assist with transition to slide  toward [employer] mod assist +2.  Transfers   Overall transfer level Needs assistance  Transfers Lateral/Scoot Transfers   Lateral/Scoot Transfers Total assist;+2 physical assistance  General transfer comment attempted lateral scoot toward HOB from EOB with pad, pt without signicant assist with total +2 to scoot 2 trials prior to return to supine  Balance  Overall balance assessment Needs assistance  Sitting-balance support Single extremity supported;Feet supported  Sitting balance-Leahy Scale Poor  Sitting balance - Comments min-minguard assist in sitting tendency for posterior lean  OT - End of Session  Activity Tolerance Patient tolerated treatment well  Patient left in bed;with call bell/phone within reach  Nurse Communication Mobility status  OT Assessment  OT Recommendation/Assessment Patient needs continued OT Services  OT Visit Diagnosis Unsteadiness on feet (R26.81);Other symptoms and signs involving cognitive function;Muscle weakness (generalized) (M62.81);Other abnormalities of gait and mobility (R26.89);Pain;Hemiplegia and hemiparesis  Symptoms and signs involving cognitive functions Other cerebrovascular disease;Cerebral infarction  Hemiplegia - Right/Left Left  Hemiplegia - dominant/non-dominant Dominant  Hemiplegia - caused by Cerebral infarction (previous CVA)  Pain - part of body  (generalized)  OT Problem List Decreased strength;Impaired balance (sitting and/or standing);Decreased activity tolerance;Decreased range of motion;Decreased coordination;Decreased cognition;Decreased safety awareness;Decreased knowledge of use of DME or AE;Cardiopulmonary status limiting activity;Obesity;Impaired UE functional use;Pain;Increased edema  OT Plan  OT Frequency (ACUTE ONLY) Min 2X/week  OT Treatment/Interventions (ACUTE ONLY) Self-care/ADL training;Therapeutic exercise;Neuromuscular education;DME and/or AE instruction;Therapeutic activities;Cognitive remediation/compensation;Patient/family education;Balance training  AM-PAC  OT "6 Clicks" Daily Activity Outcome Measure (Version 2)  Help from another person eating meals? 1 (NPO)  Help from another person taking care of personal grooming? 3  Help from another person toileting, which includes using toliet, bedpan, or urinal? 1  Help from another person bathing (including washing, rinsing, drying)? 1  Help from another person to put on and taking off regular upper body clothing? 2  Help from another person to put on and taking off regular lower body clothing? 1  6 Click Score 9  OT Recommendation  Follow Up Recommendations SNF  OT Equipment W/c; wc cushion; hoyer; hospital bed  Individuals Consulted  Consulted and Agree with Results and Recommendations Patient  Acute Rehab OT Goals  Patient Stated Goal to be able to move better  OT Goal Formulation With patient  Time For Goal Achievement 12/26/20  Potential to Achieve Goals Good  OT Time Calculation  OT Start Time (ACUTE ONLY) 0933  OT Stop Time (ACUTE ONLY) 0951  OT Time Calculation (min) 18 min  OT General Charges  $OT Visit 1 Visit  OT Evaluation  $OT Eval Moderate Complexity 1 Mod  Written Expression  Dominant Hand Right (L hemiparesis)  Maurie Boettcher, OT/L   Acute OT Clinical Specialist Acute Rehabilitation Services Pager [phone] Office [phone]
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
## 2020-12-12 NOTE — Evaluation (Signed)
Physical Therapy Evaluation/ Discharge Patient Details Name: [name] MRN: [id number] DOB: [birth year] Today's Date: 12/12/2020   History of Present Illness  70 yo admitted from [facility] 2/64 with sepsis and respiratory failure. Intubated 2/28 - 3/2. 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 Pt states he has not been OOB or assisting with mobility since transition to SNF. PT requires max +2 assist for safe transition to sitting EOB and unable to assist with lateral scoot. Pt will require maxisky for transition OOB. Pt with decreased strength, balance, cognition and mobility who requires max-total assist for all care with recommendation for return to SNF without current acute therapy needs. Should wife choose to have pt return home he would require 24hr care with lift, hospital bed and WC. RN aware of mobility performed and encouraged assisted bed mobility. No further acute needs and will sign off with pt aware.     Follow Up Recommendations SNF;Supervision/Assistance - 24 hour    Equipment Recommendations  Hospital bed;Wheelchair (measurements PT);Other (comment) (hoyer lift)    Recommendations for Other Services       Precautions / Restrictions Precautions Precautions: Fall;Other (comment) Precaution Comments: LUE hemiparesis w/ contracture, Lt AKA Restrictions Weight Bearing Restrictions: Yes LLE Weight Bearing: Non weight bearing      Mobility  Bed Mobility Overal bed mobility: Needs Assistance Bed Mobility: Supine to Sit;Sit to Supine Rolling: +2 for physical assistance;Mod assist Sidelying to sit: Max assist;+2 for physical assistance Supine to sit: Max assist;+2 for safety/equipment     General bed mobility comments: physical assist to roll and transition to sitting toward left with use of left  rail, assist at pad to pivot hips and elevate trunk. Max assist to scoot to EOB. Max assist to bring leg back onto surface and control trunk with +2 for safety. Pt able to assist with transition to slide toward [facility] mod assist +2.    Transfers Overall transfer level: Needs assistance   Transfers: Lateral/Scoot Transfers          Lateral/Scoot Transfers: Total assist;+2 physical assistance General transfer comment: attempted lateral scoot toward HOB from EOB with pad, pt without signicant assist with total +2 to scoot 2 trials prior to return to supine  Ambulation/Gait             General Gait Details: unable  Stairs            Wheelchair Mobility    Modified Rankin (Stroke Patients Only)       Balance Overall balance assessment: Needs assistance Sitting-balance support: Single extremity supported;Feet supported Sitting balance-Leahy Scale: Poor Sitting balance - Comments: min-minguard assist in sitting tendency for posterior lean                                     Pertinent Vitals/Pain Pain Score: 5  Pain Location: generalized Pain Descriptors / Indicators: Aching;Discomfort Pain Intervention(s): Limited activity within patient's tolerance;Repositioned;Monitored during session    Home Living Family/patient expects to be discharged to:: Skilled nursing facility                      Prior Function Level of Independence: Needs assistance   Gait / Transfers Assistance Needed: pt has been max to total assist  for transfers since amputation and reports not mobilizing at all OOB since D/C to SNF  ADL's / Homemaking Assistance Needed: assist for all ADLS from staff        Hand Dominance        Extremity/Trunk Assessment   Upper Extremity Assessment Upper Extremity Assessment: Defer to OT evaluation    Lower Extremity Assessment Lower Extremity Assessment: Generalized weakness;RLE deficits/detail RLE Deficits / Details: grossly  2+/5 strength LLE Deficits / Details: s/p L AKA. Weakness at baseline at hip secondary to CVA.    Cervical / Trunk Assessment Cervical / Trunk Assessment: Kyphotic  Communication   Communication: Other (comment) (pt with garbled speech at times)  Cognition Arousal/Alertness: Awake/alert Behavior During Therapy: Flat affect Overall Cognitive Status: No family/caregiver present to determine baseline cognitive functioning Area of Impairment: Attention;Memory;Following commands;Safety/judgement;Awareness;Problem solving                   Current Attention Level: Sustained Memory: Decreased short-term memory Following Commands: Follows one step commands inconsistently;Follows one step commands with increased time Safety/Judgement: Decreased awareness of safety;Decreased awareness of deficits Awareness: Intellectual Problem Solving: Difficulty sequencing;Requires verbal cues General Comments: decreased attention and memory unable to recall how long he has been non ambulatory and unable to recall name of SNF      General Comments      Exercises General Exercises - Lower Extremity Long Arc Quad: AAROM;10 reps;Seated;Right   Assessment/Plan    PT Assessment All further PT needs can be met in the next venue of care  PT Problem List Decreased strength;Decreased range of motion;Decreased activity tolerance;Decreased balance;Decreased mobility;Decreased knowledge of use of DME;Decreased knowledge of precautions;Decreased cognition;Decreased coordination;Pain;Obesity;Decreased safety awareness       PT Treatment Interventions      PT Goals (Current goals can be found in the Care Plan section)  Acute Rehab PT Goals PT Goal Formulation: All assessment and education complete, DC therapy    Frequency     Barriers to discharge        Co-evaluation PT/OT/SLP Co-Evaluation/Treatment: Yes Reason for Co-Treatment: Complexity of the patient's impairments (multi-system  involvement);For patient/therapist safety PT goals addressed during session: Mobility/safety with mobility         AM-PAC PT "6 Clicks" Mobility  Outcome Measure Help needed turning from your back to your side while in a flat bed without using bedrails?: A Lot Help needed moving from lying on your back to sitting on the side of a flat bed without using bedrails?: Total Help needed moving to and from a bed to a chair (including a wheelchair)?: Total Help needed standing up from a chair using your arms (e.g., wheelchair or bedside chair)?: Total Help needed to walk in hospital room?: Total Help needed climbing 3-5 steps with a railing? : Total 6 Click Score: 7    End of Session   Activity Tolerance: Patient tolerated treatment well Patient left: in bed;with call bell/phone within reach (bed alarm unable to set) Nurse Communication: Mobility status;Need for lift equipment PT Visit Diagnosis: Muscle weakness (generalized) (M62.81);Other abnormalities of gait and mobility (R26.89)    Time: 1324-4010 PT Time Calculation (min) (ACUTE ONLY): 19 min   Charges:   PT Evaluation $PT Eval Moderate Complexity: 1 Mod          Jiovanny Burdell P, PT Acute Rehabilitation Services Pager: [phone] Office: [phone]   Idell Hissong B Aariyah Sampey 12/12/2020, 11:06 AM

## 2020-12-13 ENCOUNTER — Inpatient Hospital Stay (HOSPITAL_COMMUNITY): Payer: Medicare Other

## 2020-12-13 DIAGNOSIS — Z452 Encounter for adjustment and management of vascular access device: Secondary | ICD-10-CM

## 2020-12-13 LAB — BASIC METABOLIC PANEL
Anion gap: 9 (ref 5–15)
BUN: 24 mg/dL — ABNORMAL HIGH (ref 8–23)
CO2: 18 mmol/L — ABNORMAL LOW (ref 22–32)
Calcium: 8.1 mg/dL — ABNORMAL LOW (ref 8.9–10.3)
Chloride: 120 mmol/L — ABNORMAL HIGH (ref 98–111)
Creatinine, Ser: 0.75 mg/dL (ref 0.61–1.24)
GFR, Estimated: >60 mL/min (ref >60)
Glucose, Bld: 114 mg/dL — ABNORMAL HIGH (ref 70–99)
Potassium: 3.3 mmol/L — ABNORMAL LOW (ref 3.5–5.1)
Sodium: 147 mmol/L — ABNORMAL HIGH (ref 135–145)

## 2020-12-13 LAB — CBC WITH DIFFERENTIAL/PLATELET
Abs Immature Granulocytes: 0.09 10*3/uL — ABNORMAL HIGH (ref 0.00–0.07)
Basophils Absolute: 0 10*3/uL (ref 0.0–0.1)
Basophils Relative: 0 %
Eosinophils Absolute: 0 10*3/uL (ref 0.0–0.5)
Eosinophils Relative: 0 %
HCT: 26 % — ABNORMAL LOW (ref 39.0–52.0)
Hemoglobin: 8.5 g/dL — ABNORMAL LOW (ref 13.0–17.0)
Immature Granulocytes: 1 %
Lymphocytes Relative: 17 %
Lymphs Abs: 1.8 10*3/uL (ref 0.7–4.0)
MCH: 32.1 pg (ref 26.0–34.0)
MCHC: 32.7 g/dL (ref 30.0–36.0)
MCV: 98.1 fL (ref 80.0–100.0)
Monocytes Absolute: 0.7 10*3/uL (ref 0.1–1.0)
Monocytes Relative: 7 %
Neutro Abs: 7.7 10*3/uL (ref 1.7–7.7)
Neutrophils Relative %: 75 %
Platelets: 158 10*3/uL (ref 150–400)
RBC: 2.65 MIL/uL — ABNORMAL LOW (ref 4.22–5.81)
RDW: 16.2 % — ABNORMAL HIGH (ref 11.5–15.5)
WBC: 10.3 10*3/uL (ref 4.0–10.5)
nRBC: 0 % (ref 0.0–0.2)

## 2020-12-13 LAB — MRSA PCR SCREENING: MRSA by PCR: NEGATIVE

## 2020-12-13 LAB — GLUCOSE, CAPILLARY
Glucose-Capillary: 93 mg/dL (ref 70–99)
Glucose-Capillary: 193 mg/dL — ABNORMAL HIGH (ref 70–99)
Glucose-Capillary: 192 mg/dL — ABNORMAL HIGH (ref 70–99)
Glucose-Capillary: 188 mg/dL — ABNORMAL HIGH (ref 70–99)

## 2020-12-13 LAB — PHOSPHORUS: Phosphorus: 2.7 mg/dL (ref 2.5–4.6)

## 2020-12-13 LAB — APTT: aPTT: 85 seconds — ABNORMAL HIGH (ref 24–36)

## 2020-12-13 LAB — BRAIN NATRIURETIC PEPTIDE: B Natriuretic Peptide: 1352.7 pg/mL — ABNORMAL HIGH (ref 0.0–100.0)

## 2020-12-13 LAB — PROCALCITONIN: Procalcitonin: 1.15 ng/mL

## 2020-12-13 LAB — HEPARIN LEVEL (UNFRACTIONATED): Heparin Unfractionated: 1.02 IU/mL — ABNORMAL HIGH (ref 0.30–0.70)

## 2020-12-13 LAB — MAGNESIUM: Magnesium: 1.5 mg/dL — ABNORMAL LOW (ref 1.7–2.4)

## 2020-12-13 LAB — URIC ACID: Uric Acid, Serum: 6.9 mg/dL (ref 3.7–8.6)

## 2020-12-13 MED ORDER — PREGABALIN 25 MG PO CAPS
25.0000 mg | ORAL_CAPSULE | Freq: Every day | ORAL | Status: DC
  Administered 2020-12-13 – 2020-12-15 (×3): 25 mg via ORAL
  Filled 2020-12-13 (×3): qty 1

## 2020-12-13 MED ORDER — MAGNESIUM SULFATE 2 GM/50ML IV SOLN
2.0000 g | Freq: Once | INTRAVENOUS | Status: AC
  Administered 2020-12-13: 2 g via INTRAVENOUS
  Filled 2020-12-13: qty 50

## 2020-12-13 MED ORDER — ATORVASTATIN CALCIUM 40 MG PO TABS
40.0000 mg | ORAL_TABLET | Freq: Every day | ORAL | Status: DC
Start: 2020-12-13 — End: 2020-12-19
  Administered 2020-12-13 – 2020-12-17 (×5): 40 mg via ORAL
  Filled 2020-12-13 (×5): qty 1

## 2020-12-13 MED ORDER — SPIRONOLACTONE 25 MG PO TABS
25.0000 mg | ORAL_TABLET | Freq: Every day | ORAL | Status: DC
  Administered 2020-12-13 – 2020-12-18 (×6): 25 mg via ORAL
  Filled 2020-12-13 (×6): qty 1

## 2020-12-13 MED ORDER — NALOXONE HCL 4 MG/0.1ML NA LIQD: 4.0000 mg | NASAL | Status: DC | PRN

## 2020-12-13 MED ORDER — SODIUM CHLORIDE 0.9 % IV SOLN
25.0000 mg | Freq: Once | INTRAVENOUS | Status: DC
  Filled 2020-12-13: qty 0.5

## 2020-12-13 MED ORDER — AMIODARONE HCL 200 MG PO TABS
200.0000 mg | ORAL_TABLET | Freq: Every day | ORAL | Status: DC
  Administered 2020-12-13 – 2020-12-18 (×6): 200 mg via ORAL
  Filled 2020-12-13 (×6): qty 1

## 2020-12-13 MED ORDER — POTASSIUM CHLORIDE CRYS ER 20 MEQ PO TBCR
40.0000 meq | EXTENDED_RELEASE_TABLET | Freq: Once | ORAL | Status: AC
  Administered 2020-12-13: 40 meq via ORAL
  Filled 2020-12-13: qty 2

## 2020-12-13 MED ORDER — DILTIAZEM HCL 25 MG/5ML IV SOLN
10.0000 mg | Freq: Four times a day (QID) | INTRAVENOUS | Status: DC | PRN
  Administered 2020-12-13: 10 mg via INTRAVENOUS
  Filled 2020-12-13 (×2): qty 5

## 2020-12-13 MED ORDER — PANTOPRAZOLE SODIUM 40 MG PO TBEC
40.0000 mg | DELAYED_RELEASE_TABLET | Freq: Every day | ORAL | Status: DC
  Administered 2020-12-13 – 2020-12-18 (×6): 40 mg via ORAL
  Filled 2020-12-13 (×6): qty 1

## 2020-12-13 MED ORDER — SODIUM CHLORIDE 0.9 % IV SOLN
25.0000 mg | Freq: Four times a day (QID) | INTRAVENOUS | Status: DC | PRN
  Filled 2020-12-13: qty 0.5

## 2020-12-13 MED ORDER — LOSARTAN POTASSIUM 25 MG PO TABS
12.5000 mg | ORAL_TABLET | Freq: Every day | ORAL | Status: DC
  Administered 2020-12-13: 12.5 mg via ORAL
  Filled 2020-12-13 (×2): qty 0.5

## 2020-12-13 MED ORDER — TRAMADOL HCL 50 MG PO TABS
50.0000 mg | ORAL_TABLET | Freq: Four times a day (QID) | ORAL | Status: DC | PRN
  Administered 2020-12-13 – 2020-12-18 (×19): 50 mg via ORAL
  Filled 2020-12-13 (×19): qty 1

## 2020-12-13 MED ORDER — LOPERAMIDE HCL 2 MG PO CAPS
4.0000 mg | ORAL_CAPSULE | Freq: Once | ORAL | Status: AC
  Administered 2020-12-13: 4 mg via ORAL
  Filled 2020-12-13: qty 2

## 2020-12-13 MED ORDER — METOPROLOL SUCCINATE ER 50 MG PO TB24
50.0000 mg | ORAL_TABLET | Freq: Every day | ORAL | Status: DC
  Administered 2020-12-13: 50 mg via ORAL
  Filled 2020-12-13: qty 1

## 2020-12-13 MED ORDER — LOSARTAN POTASSIUM 50 MG PO TABS: 25.0000 mg | ORAL_TABLET | Freq: Every day | ORAL | Status: DC

## 2020-12-13 MED ORDER — APIXABAN 5 MG PO TABS
5.0000 mg | ORAL_TABLET | Freq: Two times a day (BID) | ORAL | Status: DC
  Administered 2020-12-13 – 2020-12-18 (×11): 5 mg via ORAL
  Filled 2020-12-13 (×11): qty 1

## 2020-12-13 MED ORDER — ENSURE ENLIVE PO LIQD
237.0000 mL | Freq: Two times a day (BID) | ORAL | Status: DC
  Administered 2020-12-13 – 2020-12-18 (×7): 237 mL via ORAL
  Filled 2020-12-13: qty 237

## 2020-12-13 MED ORDER — FUROSEMIDE 10 MG/ML IJ SOLN
60.0000 mg | Freq: Every day | INTRAMUSCULAR | Status: DC
  Administered 2020-12-13: 60 mg via INTRAVENOUS
  Filled 2020-12-13: qty 6

## 2020-12-13 MED FILL — Ketamine HCl Soln Pref Syr 100 MG/10ML (10 MG/ML): INTRAMUSCULAR | Qty: 20 | Status: AC

## 2020-12-13 NOTE — NC FL2 (Signed)
[place] LEVEL OF CARE SCREENING TOOL     IDENTIFICATION  Patient Name: [name] Birthdate: July 07, 1951 Sex: male Admission Date (Current Location): 12/09/2020  [facility] and [place] Number:  [employer] and Address:  [facility], [place] [address], [place], [place] [address]      Provider Number: [id number]  Attending Physician Name and Address:  Thurnell Lose, MD  Relative Name and Phone Number:  [name] (spouse) [phone]    Current Level of Care: Hospital Recommended Level of Care: [place] Prior Approval Number:    Date Approved/Denied:   PASRR Number: [id number]  Discharge Plan: SNF    Current Diagnoses: Patient Active Problem List   Diagnosis Date Noted  . Pressure injury of skin 12/11/2020  . Septic shock ([place])   . Acute respiratory failure with hypoxia ([place])   . Multifocal pneumonia   . Palliative care by specialist   . Goals of care, counseling/discussion   . Osteomyelitis of left foot ([place])   . Cardiomyopathy ([place])   . PAD (peripheral artery disease) /SMA Stenosis/Rt and Lt SFA Stenosis/ 10/28/2020  . SIRS (systemic inflammatory response syndrome) ([place]) 10/26/2020  . Sepsis --due to Infected Lt Foot/Ostepmyleitis---POA 10/26/2020  . Acute osteomyelitis of toe, left ([place]) 10/25/2020  . Diabetic ulcer of left heel ([place]) 10/25/2020  . Fracture of left toe 10/25/2020  . Leukocytosis 10/25/2020  . Hypokalemia 10/25/2020  . Hypoalbuminemia 10/25/2020  . Hyperglycemia due to diabetes mellitus ([place]) 10/25/2020  . History of stroke 10/25/2020  . Abdominal pain, epigastric 02/09/2017  . Morbid obesity ([place]) 09/17/2016  . Sedentary lifestyle 11/20/2015  . Type 2 diabetes mellitus with vascular disease ([place]) 07/25/2015  . Bilateral carotid artery disease ([place]) 05/01/2015  . Atherosclerosis of artery of extremity with ulceration ([place]) 04/08/2015  . Lower extremity edema 02/11/2015  .  Tobacco use disorder 07/16/2014  . Chest pain 06/30/2014  . OSA on CPAP 06/30/2014  . Aftercare following surgery of the circulatory system, [place] 12/27/2013  . Chronic pain syndrome 11/03/2013  . Contracture of muscle of left upper arm 10/10/2013  . Foot fracture 10/10/2013  . Generalized OA 10/10/2013  . Anemia, iron deficiency 09/26/2013  . Insomnia 09/26/2013  . Stasis dermatitis 07/31/2013  . Peripheral autonomic neuropathy due to diabetes mellitus ([place]) 07/31/2013  . RLS (restless legs syndrome) 07/31/2013  . Allergic rhinitis 07/28/2013  . CAD, multiple vessel, 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 06/21/2013  . Essential hypertension 06/21/2013  . GERD (gastroesophageal reflux disease) 06/21/2013  . BPH (benign prostatic hyperplasia) 06/21/2013  . Cardiomyopathy, ischemic 02/22/2013  . Mixed hyperlipidemia 02/22/2013  . Atrial flutter ([place]) 01/02/2013  . Acute myocardial infarction, subendocardial infarction, initial episode of care ([place]) 01/02/2013  . Occlusion and stenosis of carotid artery without mention of cerebral infarction 10/27/2012  . CVA (cerebral infarction) 06/03/2012    Orientation RESPIRATION BLADDER Height & Weight     Self,Place  Normal Incontinent,External catheter Weight: 243 lb 2.7 oz (110.3 kg) Height:  5\' 7"  (170.2 cm)  BEHAVIORAL SYMPTOMS/MOOD NEUROLOGICAL BOWEL NUTRITION STATUS      Incontinent Diet (See DC summary)  AMBULATORY STATUS COMMUNICATION OF NEEDS Skin   Extensive Assist Verbally Surgical wounds,Other (Comment) (Stage 2 pressure injury on coccyx, closed incision on left leg)                       Personal Care Assistance Level of Assistance  Bathing,Feeding,Dressing Bathing Assistance: Maximum assistance Feeding  assistance: Maximum assistance Dressing Assistance: Maximum assistance     Functional Limitations Info  Sight,Hearing,Speech Sight Info: Adequate   Speech Info: Impaired (Garbled)    SPECIAL CARE FACTORS FREQUENCY  PT (By licensed  PT),OT (By licensed OT)     PT Frequency: 5X per week OT Frequency: 5X per week            Contractures Contractures Info: Present    Additional Factors Info  Code Status,Allergies,Insulin Sliding Scale Code Status Info: FULL Allergies Info: Flexeril, Tamsulosin Hcl, Tramadol, Robaxin, Asprin, Morphine and related   Insulin Sliding Scale Info: [place] summary       Current Medications (12/13/2020):  This is the current hospital active medication list Current Facility-Administered Medications  Medication Dose Route Frequency Provider Last Rate Last Admin  . acetaminophen (TYLENOL) suppository 650 mg  650 mg Rectal Q6H PRN Minor, Grace Bushy, NP   650 mg at 12/13/20 0118  . amiodarone (PACERONE) tablet 200 mg  200 mg Oral Daily Mannam, Praveen, MD   200 mg at 12/13/20 0813  . apixaban (ELIQUIS) tablet 5 mg  5 mg Oral BID Pham, Minh Q, RPH-CPP   5 mg at 12/13/20 0930  . atorvastatin (LIPITOR) tablet 40 mg  40 mg Oral QHS Lala Lund K, MD      . ceFEPIme (MAXIPIME) 2 g in sodium chloride 0.9 % 100 mL IVPB  2 g Intravenous Q8H Minor, Grace Bushy, NP 200 mL/hr at 12/13/20 1339 2 g at 12/13/20 1339  . Chlorhexidine Gluconate Cloth 2 % PADS 6 each  6 each Topical Q0600 Minor, Grace Bushy, NP   6 each at 12/13/20 0549  . diltiazem (CARDIZEM) injection 10 mg  10 mg Intravenous Q6H PRN Thurnell Lose, MD      . diphenhydrAMINE (BENADRYL) 25 mg in sodium chloride 0.9 % 50 mL IVPB  25 mg Intravenous Q6H PRN Thurnell Lose, MD      . furosemide (LASIX) injection 60 mg  60 mg Intravenous Daily Thurnell Lose, MD   60 mg at 12/13/20 0828  . Gerhardt's butt cream   Topical BID Marshell Garfinkel, MD   Given at 12/13/20 0815  . insulin aspart (novoLOG) injection 0-15 Units  0-15 Units Subcutaneous TID WC Anders Simmonds, MD   3 Units at 12/13/20 1146  . insulin aspart (novoLOG) injection 0-5 Units  0-5 Units Subcutaneous QHS Anders Simmonds, MD      . losartan (COZAAR) tablet 12.5 mg  12.5 mg  Oral Daily Thurnell Lose, MD   12.5 mg at 12/13/20 0827  . metoprolol succinate (TOPROL-XL) 24 hr tablet 50 mg  50 mg Oral Daily Thurnell Lose, MD   50 mg at 12/13/20 0814  . naloxone (NARCAN) nasal spray 4 mg/0.1 mL  4 mg Nasal PRN Thurnell Lose, MD      . pantoprazole (PROTONIX) EC tablet 40 mg  40 mg Oral Daily Thurnell Lose, MD   40 mg at 12/13/20 5427  . potassium chloride SA (KLOR-CON) CR tablet 40 mEq  40 mEq Oral Once Thurnell Lose, MD      . pregabalin (LYRICA) capsule 25 mg  25 mg Oral Daily Thurnell Lose, MD   25 mg at 12/13/20 0623  . Resource ThickenUp Clear   Oral PRN Minor, Grace Bushy, NP      . sodium chloride flush (NS) 0.9 % injection 10-40 mL  10-40 mL Intracatheter Q12H Minor, Grace Bushy, NP  10 mL at 12/13/20 0819  . spironolactone (ALDACTONE) tablet 25 mg  25 mg Oral Daily Thurnell Lose, MD   25 mg at 12/13/20 7915  . traMADol (ULTRAM) tablet 50 mg  50 mg Oral Q6H PRN Thurnell Lose, MD   50 mg at 12/13/20 1136     Discharge Medications: Please see discharge summary for a list of discharge medications.  Relevant Imaging Results:  Relevant Lab Results:   Additional Information SSN: [id number]. Has received Moderna vaccines 02/07/20, 03/06/20, 09/30/20  Glennon Hamilton, Student-Social Work

## 2020-12-13 NOTE — Progress Notes (Signed)
PT Cancellation Note  Patient Details Name: [name] MRN: [id number] DOB: 02-Jul-1951   Cancelled Treatment:    Reason Eval/Treat Not Completed: PT screened, no needs identified, will sign off (new order received, pt evaluated 3/3 please refer to this note. will sign off)   Harla Mensch B Ziquan Fidel 12/13/2020, 8:11 AM  Bayard Males, PT Acute Rehabilitation Services Pager: [phone] Office: [phone]

## 2020-12-13 NOTE — Progress Notes (Addendum)
Nutrition Follow-up  DOCUMENTATION CODES:   Obesity unspecified  INTERVENTION:  Provide Ensure Enlive po BID (thickened to nectar thick consistency), each supplement provides 350 kcal and 20 grams of protein  Encourage adequate PO intake.   NUTRITION DIAGNOSIS:   Inadequate oral intake related to inability to eat as evidenced by NPO status; diet advanced; progressing  GOAL:   Patient will meet greater than or equal to 90% of their needs; progressing  MONITOR:   PO intake,Supplement acceptance,Skin,Weight trends,Labs,I & O's  REASON FOR ASSESSMENT:   Ventilator,Consult Enteral/tube feeding initiation and management  ASSESSMENT:   69 year old male who presented to the ED from SNF on 2/28 with AMS, hypoxia. PMH of HTN, HLD, T2DM, diabetic food ulcer s/p left AKA on 11/11/20, OSA, CHF, CAD, SDH with TBI, R MCA stroke, recent COVID-19 infection (diagnosed 2/7). Pt intubated in the ED and admitted with septic shock 2/2 HCAP.  Pt extubated 3/2. Pt is currently on a dysphagia 2 diet with nectar thick liquids. Meal completion has been 25-50%. RD to order nutritional supplements to aid in caloric and protein needs. Pt encouraged to eat his food at meals and to drink his supplements.   Labs and medications reviewed.   Diet Order:   Diet Order            DIET DYS 2 Room service appropriate? Yes with Assist; Fluid consistency: Nectar Thick  Diet effective now                 EDUCATION NEEDS:   No education needs have been identified at this time  Skin:  Skin Assessment: Skin Integrity Issues: Skin Integrity Issues:: Stage II Stage II: coccyx Incisions: closed left leg s/p AKA  Last BM:  3/4  Height:   Ht Readings from Last 1 Encounters:  12/09/20 5\' 7"  (1.702 m)    Weight:   Wt Readings from Last 1 Encounters:  12/09/20 110.3 kg   BMI:  Body mass index is 38.09 kg/m.  Estimated Nutritional Needs:   Kcal:  2000-2200  Protein:  110-120 grams  Fluid:  >/= 2  L/day  Corrin Parker, MS, RD, LDN RD pager number/after hours weekend pager number on Amion.

## 2020-12-13 NOTE — Progress Notes (Signed)
  Speech Language Pathology Treatment: Dysphagia  Patient Details Name: JASEAN AMBROSIA MRN: [id number] DOB: 04/05/1951 Today's Date: 12/13/2020 Time: 8675-4492 SLP Time Calculation (min) (ACUTE ONLY): 15.65 min  Assessment / Plan / Recommendation Clinical Impression  Pt was seen for dysphagia treatment with his wife present. Pt and his wife were educated regarding the results of the modified barium swallow study, diet recommendations, and swallowing precautions. Video recording of the study was used to facilitate education and both parties verbalized understanding regarding all areas of education. Pt's RN and NT reported that the pt has been tolerating the current diet without difficulty. No s/sx of aspiration were noted during the session. Mastication was functional for dysphagia 3 solids with adequate oral clearance and prolonged for regular texture due to dentition Pt's cup of nectar thick liquids was noted to have a significant amount (to the extent that stirring was difficult) of ice in it and pt's wife stated, "that's how he likes it". Both parties were educated regarding the fact that ice modifies the viscosity of thickened liquids and therefore reduces their effectiveness as it relates to reducing aspiration risk. Pt's wife verbalized understanding and both parties were amenable to this SLP replacing his liquid. . Pt's diet will be advanced to dysphagia 3 solids and nectar thick liquids will be continued. SLP will continue to follow pt.    HPI HPI: Pt is a 70 year old male with PMHx significant for HTN, HLD, T2DM (c/b peripheral neuropathy and diabetic foot ulcer with subsequent L AKA 11/11/2020), OSA, HFrEF (LVEF 25-30% 10/2020), CAD (s/p CABG 2014), A-flutter (on Eliquis), history of SDH with TBI and R MCA stroke (with residual L-sided deficits) with recent admission for left foot osteomyelitis status post AKA and incidental finding of COVID. Pt presented to [facility] ED from SNF via EMS for AMS, hypoxia.  Pt was intubated on 2/28 for airway protection with extubation on 3/2. CXR 2/28: Bibasilar consolidation and/or effusions. CXR 3/2: Bilateral pulmonary infiltrates/edema again noted. Similar  findings noted on prior exam. Low lung volumes. BSE 11/03/20: functional oropharyngeal swallow. A dysphagia 2 diet with thin liquids was recommended at that time due to limited dentition which resulted in prolonged mastication.      SLP Plan  Continue with current plan of care       Recommendations  Diet recommendations: Dysphagia 3 (mechanical soft);Nectar-thick liquid Liquids provided via: Cup;Straw Medication Administration: Crushed with puree Supervision: Staff to assist with self feeding Compensations: Slow rate;Small sips/bites Postural Changes and/or Swallow Maneuvers: Seated upright 90 degrees                Oral Care Recommendations: Oral care BID;Staff/trained caregiver to provide oral care Follow up Recommendations: Skilled Nursing facility SLP Visit Diagnosis: Dysphagia, oropharyngeal phase (R13.12) Plan: Continue with current plan of care       Shanika I. Hardin Negus, [place], [place] Office number [phone] Pager [phone]                 Horton Marshall 12/13/2020, 5:32 PM

## 2020-12-13 NOTE — Progress Notes (Signed)
ANTICOAGULATION CONSULT NOTE - Follow Up Consult  Pharmacy Consult for IV Heparin Indication: atrial fibrillation  Allergies  Allergen Reactions  . Flexeril [Cyclobenzaprine] Shortness Of Breath  . Tamsulosin Hcl Swelling, Rash and Other (See Comments)    Swelling around the mouth with rash on face Swelling around the mouth with rash on face Swelling around the mouth with rash on face  Swelling around the mouth with rash on face  . Tramadol Other (See Comments)    Headache  . Robaxin [Methocarbamol] Rash  . Aspirin     On plavix  . Morphine And Related Itching    Patient Measurements: Height: 5\' 7"  (170.2 cm) Weight: 110.3 kg (243 lb 2.7 oz) IBW/kg (Calculated) : 66.1 Heparin Dosing Weight: 90.9 kg  Vital Signs: Temp: 98.6 F (37 C) (03/04 0815) Temp Source: Axillary (03/04 0815) BP: 125/91 (03/04 0815) Pulse Rate: 58 (03/04 0815)  Labs: Recent Labs    12/11/20 0352 12/11/20 1730 12/12/20 0411 12/12/20 1126 12/12/20 1900 12/13/20 0159  HGB 8.5*  --  8.0*  --   --  8.5*  HCT 28.4*  --  26.6*  --   --  26.0*  PLT 181  --  142*  --   --  158  APTT  --    < > 68* 130* 94* 85*  LABPROT 19.6*  --   --   --   --   --   INR 1.7*  --   --   --   --   --   HEPARINUNFRC  --   --  1.86*  --   --  1.02*  CREATININE 1.28*  --  0.75  --   --  0.75   < > = values in this interval not displayed.    Estimated Creatinine Clearance: 104.8 mL/min (by C-G formula based on SCr of 0.75 mg/dL).   Assessment: 70 yr old male on apixaban prior to admission for atrial fibrillation  was admitted for altered mental status, respiratory failure and HAP. Patient required intubation and apixaban was held - last dose was on 2/28 at 0800 AM. Initially, patient showed signs of coagulopathy with slightly prolonged baseline aPTT at 41 and INR up to 2.3. Today, INR down to 1.7. Pharmacy was consulted to start IV heparin therapy while apixaban is on hold. Given recent apixaban exposure, will monitor  anticoagulation using aPTT until aPTT and heparin levels correlate.  Pt was transition to floor yesterday. Plan is to put her back on her prior to admission apixaban for her afib. Age<80, wt>60kg, scr<1.5  Hgb 8.5, plt 158k  Goal of Therapy:  Monitor platelets by anticoagulation protocol: Yes   Plan:  Dc heparin Apixaban 5mg  PO BID Rx will follow peripherally  Onnie Boer, PharmD, BCIDP, AAHIVP, CPP Infectious Disease Pharmacist 12/13/2020 8:47 AM

## 2020-12-13 NOTE — Progress Notes (Signed)
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 ejected out;Material enters airway, passes BELOW cords and not ejected out despite cough attempt by patient Pharyngeal- Thin Teaspoon -- Pharyngeal -- Pharyngeal- Thin Cup Delayed swallow initiation-vallecula;Pharyngeal residue - valleculae;Delayed swallow initiation-pyriform sinuses;Penetration/Aspiration during swallow Pharyngeal Material enters airway, remains ABOVE vocal cords and not ejected out;Material enters airway, CONTACTS cords and then ejected out;Material enters airway, CONTACTS cords and not ejected out;Material enters airway, passes BELOW cords and not ejected out despite cough attempt by patient Pharyngeal- Thin Straw Delayed swallow initiation-vallecula;Pharyngeal residue - valleculae;Delayed swallow initiation-pyriform sinuses;Reduced tongue base retraction;Penetration/Aspiration during swallow;Penetration/Apiration after swallow Pharyngeal Material enters airway, remains ABOVE vocal cords and not  ejected out;Material enters airway, CONTACTS cords and then ejected out;Material enters airway, CONTACTS cords and not ejected out;Material enters airway, passes BELOW cords and not ejected out despite cough attempt by patient Pharyngeal- Puree Pharyngeal residue - valleculae;Reduced tongue base retraction Pharyngeal -- Pharyngeal- Mechanical Soft Pharyngeal residue - valleculae;Reduced tongue base retraction Pharyngeal -- Pharyngeal- Regular -- Pharyngeal -- Pharyngeal- Multi-consistency -- Pharyngeal -- Pharyngeal- Pill Pharyngeal residue - valleculae;Reduced tongue base retraction Pharyngeal -- Pharyngeal Comment --  CHL IP CERVICAL ESOPHAGEAL PHASE 12/12/2020 Cervical Esophageal Phase WFL Pudding Teaspoon -- Pudding Cup -- Honey Teaspoon -- Honey Cup -- Nectar Teaspoon -- Nectar Cup -- Nectar Straw -- Thin Teaspoon -- Thin Cup -- Thin Straw -- Puree -- Mechanical Soft -- Regular -- Multi-consistency -- Pill -- Cervical Esophageal Comment -- [name], [place], [place] Office number [phone] Pager [place] 12/12/2020, 12:04 PM              ECHOCARDIOGRAM COMPLETE  Result Date: 12/10/2020    ECHOCARDIOGRAM REPORT   Patient Name:   [name] Date of Exam: 12/10/2020 Medical Rec #:  [id number]      Height:       67.0 in Accession #:    [id number]     Weight:       243.2 lb Date of Birth:  [birth year]      BSA:          2.197 m Patient Age:    27 years       BP:           111/73 mmHg Patient Gender:  M              HR:           92 bpm. Exam Location:  Inpatient Procedure: 2D Echo, Color Doppler, Cardiac Doppler and Intracardiac            Opacification Agent Indications:    CHF-Acute Systolic A54.09  History:        Patient has prior history of Echocardiogram examinations, most                 recent 10/29/2020. Previous Myocardial Infarction and CAD,                 Stroke, Arrythmias:Atrial Flutter; Risk Factors:Hypertension,                 Dyslipidemia  and Diabetes.  Sonographer:    Bernadene Person RDCS Referring Phys: [place] Comments: No subcostal window. IMPRESSIONS  1. Left ventricular ejection fraction, by estimation, is 20 to 25%. The left ventricle has severely decreased function. The left ventricle demonstrates global hypokinesis. Left ventricular diastolic parameters are indeterminate.  2. Right ventricular systolic function is normal. The right ventricular size is normal.  3. Left atrial size was mildly dilated.  4. The mitral valve is abnormal. Trivial mitral valve regurgitation. No evidence of mitral stenosis. Moderate mitral annular calcification.  5. The aortic valve is abnormal. There is moderate calcification of the aortic valve. There is moderate thickening of the aortic valve. Aortic valve regurgitation is not visualized. Mild to moderate aortic valve sclerosis/calcification is present, without any evidence of aortic stenosis.  6. The inferior vena cava is normal in size with greater than 50% respiratory variability, suggesting right atrial pressure of 3 mmHg. FINDINGS  Left Ventricle: Left ventricular ejection fraction, by estimation, is 20 to 25%. The left ventricle has severely decreased function. The left ventricle demonstrates global hypokinesis. Definity contrast agent was given IV to delineate the left ventricular endocardial borders. The left ventricular internal cavity size was normal in size. There is no left ventricular hypertrophy. Left ventricular diastolic parameters are indeterminate. Right Ventricle: The right ventricular size is normal. No increase in right ventricular wall thickness. Right ventricular systolic function is normal. Left Atrium: Left atrial size was mildly dilated. Right Atrium: Right atrial size was normal in size. Pericardium: There is no evidence of pericardial effusion. Mitral Valve: The mitral valve is abnormal. There is moderate thickening of the mitral valve leaflet(s).  There is moderate calcification of the mitral valve leaflet(s). Moderate mitral annular calcification. Trivial mitral valve regurgitation. No evidence of mitral valve stenosis. Tricuspid Valve: The tricuspid valve is normal in structure. Tricuspid valve regurgitation is not demonstrated. No evidence of tricuspid stenosis. Aortic Valve: The aortic valve is abnormal. There is moderate calcification of the aortic valve. There is moderate thickening of the aortic valve. Aortic valve regurgitation is not visualized. Mild to moderate aortic valve sclerosis/calcification is present, without any evidence of aortic stenosis. Pulmonic Valve: The pulmonic valve was normal in structure. Pulmonic valve regurgitation is not visualized. No evidence of pulmonic stenosis. Aorta: The aortic root is normal in size and structure. Venous: The inferior vena cava is normal in size with greater than 50% respiratory variability, suggesting right atrial pressure of 3 mmHg. IAS/Shunts: No atrial level shunt detected by color flow Doppler.  LEFT VENTRICLE PLAX 2D LVIDd:         5.30 cm LVIDs:         4.20  cm LV PW:         1.00 cm LV IVS:        1.00 cm LVOT diam:     2.10 cm LV SV:         63 LV SV Index:   28 LVOT Area:     3.46 cm  LV Volumes (MOD) LV vol d, MOD A2C: 130.0 ml LV vol d, MOD A4C: 140.0 ml LV vol s, MOD A2C: 87.5 ml LV vol s, MOD A4C: 89.9 ml LV SV MOD A2C:     42.5 ml LV SV MOD A4C:     140.0 ml LV SV MOD BP:      49.3 ml RIGHT VENTRICLE RV S prime:     6.00 cm/s TAPSE (M-mode): 1.5 cm LEFT ATRIUM             Index       RIGHT ATRIUM           Index LA diam:        4.00 cm 1.82 cm/m  RA Area:     10.70 cm LA Vol (A2C):   64.1 ml 29.17 ml/m RA Volume:   21.70 ml  9.88 ml/m LA Vol (A4C):   66.9 ml 30.45 ml/m LA Biplane Vol: 64.8 ml 29.49 ml/m  AORTIC VALVE LVOT Vmax:   108.98 cm/s LVOT Vmean:  71.025 cm/s LVOT VTI:    0.180 m  AORTA Ao Root diam: 3.60 cm Ao Asc diam:  3.40 cm  SHUNTS Systemic VTI:  0.18 m Systemic Diam:  2.10 cm Jenkins Rouge MD Electronically signed by Jenkins Rouge MD Signature Date/Time: 12/10/2020/11:16:26 AM    Final

## 2020-12-14 LAB — C-REACTIVE PROTEIN: CRP: 11 mg/dL — ABNORMAL HIGH (ref <1.0)

## 2020-12-14 LAB — COMPREHENSIVE METABOLIC PANEL
ALT: 49 U/L — ABNORMAL HIGH (ref 0–44)
AST: 44 U/L — ABNORMAL HIGH (ref 15–41)
Albumin: 1.9 g/dL — ABNORMAL LOW (ref 3.5–5.0)
Alkaline Phosphatase: 80 U/L (ref 38–126)
Anion gap: 8 (ref 5–15)
BUN: 18 mg/dL (ref 8–23)
CO2: 20 mmol/L — ABNORMAL LOW (ref 22–32)
Calcium: 8 mg/dL — ABNORMAL LOW (ref 8.9–10.3)
Chloride: 114 mmol/L — ABNORMAL HIGH (ref 98–111)
Creatinine, Ser: 0.79 mg/dL (ref 0.61–1.24)
GFR, Estimated: >60 mL/min (ref >60)
Glucose, Bld: 162 mg/dL — ABNORMAL HIGH (ref 70–99)
Potassium: 3.3 mmol/L — ABNORMAL LOW (ref 3.5–5.1)
Sodium: 142 mmol/L (ref 135–145)
Total Bilirubin: 0.4 mg/dL (ref 0.3–1.2)
Total Protein: 5.8 g/dL — ABNORMAL LOW (ref 6.5–8.1)

## 2020-12-14 LAB — CULTURE, BLOOD (ROUTINE X 2)
Culture: NO GROWTH
Culture: NO GROWTH
Special Requests: ADEQUATE

## 2020-12-14 LAB — CBC WITH DIFFERENTIAL/PLATELET
Abs Immature Granulocytes: 0.11 10*3/uL — ABNORMAL HIGH (ref 0.00–0.07)
Basophils Absolute: 0 10*3/uL (ref 0.0–0.1)
Basophils Relative: 0 %
Eosinophils Absolute: 0 10*3/uL (ref 0.0–0.5)
Eosinophils Relative: 0 %
HCT: 27.4 % — ABNORMAL LOW (ref 39.0–52.0)
Hemoglobin: 8.6 g/dL — ABNORMAL LOW (ref 13.0–17.0)
Immature Granulocytes: 1 %
Lymphocytes Relative: 20 %
Lymphs Abs: 2.2 10*3/uL (ref 0.7–4.0)
MCH: 31.6 pg (ref 26.0–34.0)
MCHC: 31.4 g/dL (ref 30.0–36.0)
MCV: 100.7 fL — ABNORMAL HIGH (ref 80.0–100.0)
Monocytes Absolute: 0.9 10*3/uL (ref 0.1–1.0)
Monocytes Relative: 9 %
Neutro Abs: 7.6 10*3/uL (ref 1.7–7.7)
Neutrophils Relative %: 70 %
Platelets: 154 10*3/uL (ref 150–400)
RBC: 2.72 MIL/uL — ABNORMAL LOW (ref 4.22–5.81)
RDW: 16.1 % — ABNORMAL HIGH (ref 11.5–15.5)
WBC: 10.9 10*3/uL — ABNORMAL HIGH (ref 4.0–10.5)
nRBC: 0 % (ref 0.0–0.2)

## 2020-12-14 LAB — GLUCOSE, CAPILLARY
Glucose-Capillary: 155 mg/dL — ABNORMAL HIGH (ref 70–99)
Glucose-Capillary: 236 mg/dL — ABNORMAL HIGH (ref 70–99)
Glucose-Capillary: 217 mg/dL — ABNORMAL HIGH (ref 70–99)
Glucose-Capillary: 112 mg/dL — ABNORMAL HIGH (ref 70–99)

## 2020-12-14 LAB — BRAIN NATRIURETIC PEPTIDE: B Natriuretic Peptide: 1366.2 pg/mL — ABNORMAL HIGH (ref 0.0–100.0)

## 2020-12-14 LAB — PROCALCITONIN: Procalcitonin: 0.67 ng/mL

## 2020-12-14 LAB — MAGNESIUM: Magnesium: 1.6 mg/dL — ABNORMAL LOW (ref 1.7–2.4)

## 2020-12-14 MED ORDER — POTASSIUM CHLORIDE CRYS ER 20 MEQ PO TBCR
40.0000 meq | EXTENDED_RELEASE_TABLET | Freq: Once | ORAL | Status: AC
  Administered 2020-12-14: 40 meq via ORAL
  Filled 2020-12-14: qty 2

## 2020-12-14 MED ORDER — HYDROXYZINE HCL 25 MG PO TABS
25.0000 mg | ORAL_TABLET | Freq: Every evening | ORAL | Status: DC | PRN
  Administered 2020-12-14 – 2020-12-16 (×3): 25 mg via ORAL
  Filled 2020-12-14 (×3): qty 1

## 2020-12-14 MED ORDER — METOPROLOL SUCCINATE ER 25 MG PO TB24
25.0000 mg | ORAL_TABLET | Freq: Every day | ORAL | Status: DC
  Administered 2020-12-14 – 2020-12-16 (×3): 25 mg via ORAL
  Filled 2020-12-14 (×3): qty 1

## 2020-12-14 MED ORDER — MAGNESIUM SULFATE 4 GM/100ML IV SOLN
4.0000 g | Freq: Once | INTRAVENOUS | Status: AC
  Administered 2020-12-14: 4 g via INTRAVENOUS
  Filled 2020-12-14: qty 100

## 2020-12-14 MED ORDER — FUROSEMIDE 40 MG PO TABS
40.0000 mg | ORAL_TABLET | Freq: Every day | ORAL | Status: DC
  Administered 2020-12-14: 40 mg via ORAL
  Filled 2020-12-14: qty 1

## 2020-12-14 MED ORDER — FUROSEMIDE 10 MG/ML IJ SOLN: 40.0000 mg | Freq: Every day | INTRAMUSCULAR | Status: DC

## 2020-12-14 NOTE — Progress Notes (Signed)
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 transit;Reduced posterior propulsion Oral - Thin Teaspoon -- Oral - Thin Cup Lingual pumping;Delayed oral transit;Reduced posterior propulsion Oral - Thin Straw Lingual pumping;Delayed oral transit;Reduced posterior propulsion Oral - Puree Lingual pumping;Reduced posterior propulsion Oral - Mech Soft Lingual pumping;Reduced posterior propulsion Oral - Regular -- Oral - Multi-Consistency -- Oral - Pill Lingual pumping;Delayed oral transit;Reduced posterior propulsion Oral Phase - Comment --  CHL IP PHARYNGEAL PHASE 12/12/2020 Pharyngeal Phase Impaired Pharyngeal- Pudding Teaspoon -- Pharyngeal -- Pharyngeal- Pudding Cup -- Pharyngeal -- Pharyngeal- Honey Teaspoon -- Pharyngeal -- Pharyngeal- Honey Cup -- Pharyngeal -- Pharyngeal- Nectar Teaspoon -- Pharyngeal -- Pharyngeal- Nectar Cup Pharyngeal residue - valleculae;Reduced tongue base retraction Pharyngeal -- Pharyngeal- Nectar Straw Delayed swallow  initiation-vallecula;Pharyngeal residue - valleculae;Reduced tongue base retraction;Penetration/Aspiration during swallow;Penetration/Apiration after swallow Pharyngeal Material enters airway, CONTACTS cords and not ejected out;Material enters airway, passes BELOW  cords and not ejected out despite cough attempt by patient Pharyngeal- Thin Teaspoon -- Pharyngeal -- Pharyngeal- Thin Cup Delayed swallow initiation-vallecula;Pharyngeal residue - valleculae;Delayed swallow initiation-pyriform sinuses;Penetration/Aspiration during swallow Pharyngeal Material enters airway, remains ABOVE vocal cords and not ejected out;Material enters airway, CONTACTS cords and then ejected out;Material enters airway, CONTACTS cords and not ejected out;Material enters airway, passes BELOW cords and not ejected out despite cough attempt by patient Pharyngeal- Thin Straw Delayed swallow initiation-vallecula;Pharyngeal residue - valleculae;Delayed swallow initiation-pyriform sinuses;Reduced tongue base retraction;Penetration/Aspiration during swallow;Penetration/Apiration after swallow Pharyngeal Material enters airway, remains ABOVE vocal cords and not ejected out;Material enters airway, CONTACTS cords and then ejected out;Material enters airway, CONTACTS cords and not ejected out;Material enters airway, passes BELOW cords and not ejected out despite cough attempt by patient Pharyngeal- Puree Pharyngeal residue - valleculae;Reduced tongue base retraction Pharyngeal -- Pharyngeal- Mechanical Soft Pharyngeal residue - valleculae;Reduced tongue base retraction Pharyngeal -- Pharyngeal- Regular -- Pharyngeal -- Pharyngeal- Multi-consistency -- Pharyngeal -- Pharyngeal- Pill Pharyngeal residue - valleculae;Reduced tongue base retraction Pharyngeal -- Pharyngeal Comment --  CHL IP CERVICAL ESOPHAGEAL PHASE 12/12/2020 Cervical Esophageal Phase WFL Pudding Teaspoon -- Pudding Cup -- Honey Teaspoon -- Honey Cup -- Nectar Teaspoon -- Nectar Cup --  Nectar Straw -- Thin Teaspoon -- Thin Cup -- Thin Straw -- Puree -- Mechanical Soft -- Regular -- Multi-consistency -- Pill -- Cervical Esophageal Comment -- Shanika I. Hardin Negus, [place], [place] Office number [phone] Pager [phone] Horton Marshall 12/12/2020, 12:04 PM              ECHOCARDIOGRAM COMPLETE  Result Date: 12/10/2020    ECHOCARDIOGRAM REPORT   Patient Name:   DRAGON THRUSH Date of Exam: 12/10/2020 Medical Rec #:  [id number]      Height:       67.0 in Accession #:    [id number]     Weight:       243.2 lb Date of Birth:  [birth year]      BSA:          2.197 m Patient Age:    45 years       BP:           111/73 mmHg Patient Gender: M              HR:           92 bpm. Exam Location:  Inpatient Procedure: 2D Echo, Color Doppler, Cardiac Doppler and Intracardiac            Opacification Agent Indications:    CHF-Acute Systolic M42.68  History:        Patient has prior history of Echocardiogram examinations, most                 recent 10/29/2020. Previous Myocardial Infarction and CAD,                 Stroke, Arrythmias:Atrial Flutter; Risk Factors:Hypertension,                 Dyslipidemia and Diabetes.  Sonographer:    Bernadene Person RDCS Referring Phys: [place] Comments: No subcostal window. IMPRESSIONS  1. Left ventricular ejection fraction, by estimation, is 20 to 25%. The left ventricle has severely decreased function. The left ventricle demonstrates global hypokinesis. Left ventricular diastolic parameters are indeterminate.  2. Right ventricular systolic function is normal. The right ventricular size is normal.  3. Left atrial size was mildly dilated.  4. The mitral valve  is abnormal. Trivial mitral valve regurgitation. No evidence of mitral stenosis. Moderate mitral annular calcification.  5. The aortic valve is abnormal. There is moderate calcification of the aortic valve. There is moderate thickening of the aortic valve. Aortic  valve regurgitation is not visualized. Mild to moderate aortic valve sclerosis/calcification is present, without any evidence of aortic stenosis.  6. The inferior vena cava is normal in size with greater than 50% respiratory variability, suggesting right atrial pressure of 3 mmHg. FINDINGS  Left Ventricle: Left ventricular ejection fraction, by estimation, is 20 to 25%. The left ventricle has severely decreased function. The left ventricle demonstrates global hypokinesis. Definity contrast agent was given IV to delineate the left ventricular endocardial borders. The left ventricular internal cavity size was normal in size. There is no left ventricular hypertrophy. Left ventricular diastolic parameters are indeterminate. Right Ventricle: The right ventricular size is normal. No increase in right ventricular wall thickness. Right ventricular systolic function is normal. Left Atrium: Left atrial size was mildly dilated. Right Atrium: Right atrial size was normal in size. Pericardium: There is no evidence of pericardial effusion. Mitral Valve: The mitral valve is abnormal. There is moderate thickening of the mitral valve leaflet(s). There is moderate calcification of the mitral valve leaflet(s). Moderate mitral annular calcification. Trivial mitral valve regurgitation. No evidence of mitral valve stenosis. Tricuspid Valve: The tricuspid valve is normal in structure. Tricuspid valve regurgitation is not demonstrated. No evidence of tricuspid stenosis. Aortic Valve: The aortic valve is abnormal. There is moderate calcification of the aortic valve. There is moderate thickening of the aortic valve. Aortic valve regurgitation is not visualized. Mild to moderate aortic valve sclerosis/calcification is present, without any evidence of aortic stenosis. Pulmonic Valve: The pulmonic valve was normal in structure. Pulmonic valve regurgitation is not visualized. No evidence of pulmonic stenosis. Aorta: The aortic root is normal in  size and structure. Venous: The inferior vena cava is normal in size with greater than 50% respiratory variability, suggesting right atrial pressure of 3 mmHg. IAS/Shunts: No atrial level shunt detected by color flow Doppler.  LEFT VENTRICLE PLAX 2D LVIDd:         5.30 cm LVIDs:         4.20 cm LV PW:         1.00 cm LV IVS:        1.00 cm LVOT diam:     2.10 cm LV SV:         63 LV SV Index:   28 LVOT Area:     3.46 cm  LV Volumes (MOD) LV vol d, MOD A2C: 130.0 ml LV vol d, MOD A4C: 140.0 ml LV vol s, MOD A2C: 87.5 ml LV vol s, MOD A4C: 89.9 ml LV SV MOD A2C:     42.5 ml LV SV MOD A4C:     140.0 ml LV SV MOD BP:      49.3 ml RIGHT VENTRICLE RV S prime:     6.00 cm/s TAPSE (M-mode): 1.5 cm LEFT ATRIUM             Index       RIGHT ATRIUM           Index LA diam:        4.00 cm 1.82 cm/m  RA Area:     10.70 cm LA Vol (A2C):   64.1 ml 29.17 ml/m RA Volume:   21.70 ml  9.88 ml/m LA Vol (A4C):   66.9 ml 30.45 ml/m LA  Biplane Vol: 64.8 ml 29.49 ml/m  AORTIC VALVE LVOT Vmax:   108.98 cm/s LVOT Vmean:  71.025 cm/s LVOT VTI:    0.180 m  AORTA Ao Root diam: 3.60 cm Ao Asc diam:  3.40 cm  SHUNTS Systemic VTI:  0.18 m Systemic Diam: 2.10 cm Jenkins Rouge MD Electronically signed by Jenkins Rouge MD Signature Date/Time: 12/10/2020/11:16:26 AM    Final

## 2020-12-14 NOTE — Progress Notes (Signed)
Pharmacy Antibiotic Note  [name] is a 70 y.o. male admitted on 12/09/2020 with pneumonia.  Pharmacy has been consulted for Cefepime dosing. Patient clinically improving remains afebrile, procal trending down, and micro data ngtd. Scr normalized <1, WBC 10.9 down.   Plan: Continue Cefepime 2G IV Q8H  F/u plan to transition to oral therapy in the coming days per MD pending clinical status  Monitor renal fxn and culture data   Height: 5\' 7"  (170.2 cm) Weight: 95.1 kg (209 lb 10.5 oz) IBW/kg (Calculated) : 66.1  Temp (24hrs), Avg:98.5 F (36.9 C), Min:97.6 F (36.4 C), Max:99.7 F (37.6 C)  Recent Labs  Lab 12/09/20 1254 12/09/20 1457 12/09/20 2140 12/10/20 0039 12/10/20 0307 12/11/20 0352 12/12/20 0411 12/13/20 0159 12/14/20 0338  WBC  --   --   --   --  23.8* 14.0* 12.4* 10.3 10.9*  CREATININE  --   --  2.55*  --  2.41* 1.28* 0.75 0.75 0.79  LATICACIDVEN 3.9* 4.6* 3.9* 3.7*  --   --   --   --   --     Estimated Creatinine Clearance: 97.1 mL/min (by C-G formula based on SCr of 0.79 mg/dL).      Antimicrobials this admission: Vanco 2/28 >> 3/3 Cefepime 2/28 >>    Microbiology results:  2/28 BCx: ngtd   2/28 MRSA PCR: negative 3/1 Resp cx >>normal resp flora  Thank you for allowing pharmacy to be a part of this patient's care. Cephus Slater, PharmD, [facility] Resident [phone] 12/14/2020 11:17 AM

## 2020-12-15 ENCOUNTER — Inpatient Hospital Stay (HOSPITAL_COMMUNITY): Payer: Medicare Other

## 2020-12-15 LAB — CBC WITH DIFFERENTIAL/PLATELET
Abs Immature Granulocytes: 0.26 10*3/uL — ABNORMAL HIGH (ref 0.00–0.07)
Basophils Absolute: 0.1 10*3/uL (ref 0.0–0.1)
Basophils Relative: 0 %
Eosinophils Absolute: 0.1 10*3/uL (ref 0.0–0.5)
Eosinophils Relative: 1 %
HCT: 26.1 % — ABNORMAL LOW (ref 39.0–52.0)
Hemoglobin: 8.4 g/dL — ABNORMAL LOW (ref 13.0–17.0)
Immature Granulocytes: 2 %
Lymphocytes Relative: 23 %
Lymphs Abs: 2.7 10*3/uL (ref 0.7–4.0)
MCH: 31.8 pg (ref 26.0–34.0)
MCHC: 32.2 g/dL (ref 30.0–36.0)
MCV: 98.9 fL (ref 80.0–100.0)
Monocytes Absolute: 0.9 10*3/uL (ref 0.1–1.0)
Monocytes Relative: 7 %
Neutro Abs: 7.8 10*3/uL — ABNORMAL HIGH (ref 1.7–7.7)
Neutrophils Relative %: 67 %
Platelets: 152 10*3/uL (ref 150–400)
RBC: 2.64 MIL/uL — ABNORMAL LOW (ref 4.22–5.81)
RDW: 16.1 % — ABNORMAL HIGH (ref 11.5–15.5)
WBC: 11.9 10*3/uL — ABNORMAL HIGH (ref 4.0–10.5)
nRBC: 0 % (ref 0.0–0.2)

## 2020-12-15 LAB — GLUCOSE, CAPILLARY
Glucose-Capillary: 106 mg/dL — ABNORMAL HIGH (ref 70–99)
Glucose-Capillary: 199 mg/dL — ABNORMAL HIGH (ref 70–99)
Glucose-Capillary: 140 mg/dL — ABNORMAL HIGH (ref 70–99)
Glucose-Capillary: 101 mg/dL — ABNORMAL HIGH (ref 70–99)

## 2020-12-15 LAB — C-REACTIVE PROTEIN: CRP: 7.6 mg/dL — ABNORMAL HIGH (ref <1.0)

## 2020-12-15 LAB — COMPREHENSIVE METABOLIC PANEL
ALT: 86 U/L — ABNORMAL HIGH (ref 0–44)
AST: 67 U/L — ABNORMAL HIGH (ref 15–41)
Albumin: 1.8 g/dL — ABNORMAL LOW (ref 3.5–5.0)
Alkaline Phosphatase: 82 U/L (ref 38–126)
Anion gap: 7 (ref 5–15)
BUN: 15 mg/dL (ref 8–23)
CO2: 20 mmol/L — ABNORMAL LOW (ref 22–32)
Calcium: 7.8 mg/dL — ABNORMAL LOW (ref 8.9–10.3)
Chloride: 109 mmol/L (ref 98–111)
Creatinine, Ser: 0.81 mg/dL (ref 0.61–1.24)
GFR, Estimated: >60 mL/min (ref >60)
Glucose, Bld: 112 mg/dL — ABNORMAL HIGH (ref 70–99)
Potassium: 3.8 mmol/L (ref 3.5–5.1)
Sodium: 136 mmol/L (ref 135–145)
Total Bilirubin: 0.6 mg/dL (ref 0.3–1.2)
Total Protein: 5.8 g/dL — ABNORMAL LOW (ref 6.5–8.1)

## 2020-12-15 LAB — MAGNESIUM: Magnesium: 1.8 mg/dL (ref 1.7–2.4)

## 2020-12-15 LAB — PROCALCITONIN: Procalcitonin: 0.37 ng/mL

## 2020-12-15 LAB — BRAIN NATRIURETIC PEPTIDE: B Natriuretic Peptide: 1053.6 pg/mL — ABNORMAL HIGH (ref 0.0–100.0)

## 2020-12-15 MED ORDER — PREGABALIN 25 MG PO CAPS
25.0000 mg | ORAL_CAPSULE | Freq: Once | ORAL | Status: AC
  Administered 2020-12-15: 25 mg via ORAL
  Filled 2020-12-15: qty 1

## 2020-12-15 MED ORDER — POTASSIUM CHLORIDE CRYS ER 20 MEQ PO TBCR
20.0000 meq | EXTENDED_RELEASE_TABLET | Freq: Once | ORAL | Status: AC
  Administered 2020-12-15: 20 meq via ORAL
  Filled 2020-12-15: qty 1

## 2020-12-15 MED ORDER — DIGOXIN 0.25 MG/ML IJ SOLN
0.2500 mg | Freq: Four times a day (QID) | INTRAMUSCULAR | Status: AC
  Administered 2020-12-15 (×2): 0.25 mg via INTRAVENOUS
  Filled 2020-12-15 (×2): qty 2

## 2020-12-15 MED ORDER — FUROSEMIDE 20 MG PO TABS
20.0000 mg | ORAL_TABLET | Freq: Every day | ORAL | Status: DC
  Administered 2020-12-15: 20 mg via ORAL
  Filled 2020-12-15: qty 1

## 2020-12-15 MED ORDER — PREGABALIN 50 MG PO CAPS
50.0000 mg | ORAL_CAPSULE | Freq: Every day | ORAL | Status: DC
Start: 2020-12-16 — End: 2020-12-19
  Administered 2020-12-16 – 2020-12-18 (×3): 50 mg via ORAL
  Filled 2020-12-15 (×3): qty 1

## 2020-12-15 NOTE — Progress Notes (Signed)
Patients wife giving patient thin liquids. Educated on the importance of nectar thick consistency. Stated patient does not like the thickener.

## 2020-12-15 NOTE — Plan of Care (Signed)
  Problem: Education: Goal: Knowledge of General Education information will improve Description: Including pain rating scale, medication(s)/side effects and non-pharmacologic comfort measures Outcome: Progressing   Problem: Health Behavior/Discharge Planning: Goal: Ability to manage health-related needs will improve Outcome: Progressing   Problem: Clinical Measurements: Goal: Will remain free from infection Outcome: Progressing Goal: Respiratory complications will improve Outcome: Progressing   Problem: Nutrition: Goal: Adequate nutrition will be maintained Outcome: Progressing   Problem: Coping: Goal: Level of anxiety will decrease Outcome: Progressing

## 2020-12-15 NOTE — Progress Notes (Signed)
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 weeks      [facility] IP ORAL PHASE 12/12/2020 Oral Phase Impaired Oral - Pudding Teaspoon -- Oral - Pudding Cup -- Oral - Honey Teaspoon -- Oral - Honey Cup -- Oral - Nectar Teaspoon -- Oral - Nectar Cup Lingual pumping;Delayed oral transit;Reduced posterior propulsion Oral - Nectar Straw Lingual pumping;Delayed oral transit;Reduced posterior propulsion Oral - Thin Teaspoon -- Oral - Thin Cup Lingual pumping;Delayed oral transit;Reduced posterior propulsion Oral - Thin Straw Lingual pumping;Delayed oral transit;Reduced posterior propulsion Oral - Puree Lingual pumping;Reduced posterior propulsion Oral - Mech Soft Lingual pumping;Reduced posterior  propulsion Oral - Regular -- Oral - Multi-Consistency -- Oral - Pill Lingual pumping;Delayed oral transit;Reduced posterior propulsion Oral Phase - Comment --  [facility] IP PHARYNGEAL PHASE 12/12/2020  Pharyngeal Phase Impaired Pharyngeal- Pudding Teaspoon -- Pharyngeal -- Pharyngeal- Pudding Cup -- Pharyngeal -- Pharyngeal- Honey Teaspoon -- Pharyngeal -- Pharyngeal- Honey Cup -- Pharyngeal -- Pharyngeal- Nectar Teaspoon -- Pharyngeal -- Pharyngeal- Nectar Cup Pharyngeal residue - valleculae;Reduced tongue base retraction Pharyngeal -- Pharyngeal- Nectar Straw Delayed swallow initiation-vallecula;Pharyngeal residue - valleculae;Reduced tongue base retraction;Penetration/Aspiration during swallow;Penetration/Apiration after swallow Pharyngeal Material enters airway, CONTACTS cords and not ejected out;Material enters airway, passes BELOW cords and not ejected out despite cough attempt by patient Pharyngeal- Thin Teaspoon -- Pharyngeal -- Pharyngeal- Thin Cup Delayed swallow initiation-vallecula;Pharyngeal residue - valleculae;Delayed swallow initiation-pyriform sinuses;Penetration/Aspiration during swallow Pharyngeal Material enters airway, remains ABOVE vocal cords and not ejected out;Material enters airway, CONTACTS cords and then ejected out;Material enters airway, CONTACTS cords and not ejected out;Material enters airway, passes BELOW cords and not ejected out despite cough attempt by patient Pharyngeal- Thin Straw Delayed swallow initiation-vallecula;Pharyngeal residue - valleculae;Delayed swallow initiation-pyriform sinuses;Reduced tongue base retraction;Penetration/Aspiration during swallow;Penetration/Apiration after swallow Pharyngeal Material enters airway, remains ABOVE vocal cords and not ejected out;Material enters airway, CONTACTS cords and then ejected out;Material enters airway, CONTACTS cords and not ejected out;Material enters airway, passes BELOW cords and not ejected out despite cough attempt by patient Pharyngeal- Puree Pharyngeal residue - valleculae;Reduced tongue base retraction Pharyngeal -- Pharyngeal- Mechanical Soft Pharyngeal residue - valleculae;Reduced tongue base retraction  Pharyngeal -- Pharyngeal- Regular -- Pharyngeal -- Pharyngeal- Multi-consistency -- Pharyngeal -- Pharyngeal- Pill Pharyngeal residue - valleculae;Reduced tongue base retraction Pharyngeal -- Pharyngeal Comment --  [facility] IP CERVICAL ESOPHAGEAL PHASE 12/12/2020 Cervical Esophageal Phase WFL Pudding Teaspoon -- Pudding Cup -- Honey Teaspoon -- Honey Cup -- Nectar Teaspoon -- Nectar Cup -- Nectar Straw -- Thin Teaspoon -- Thin Cup -- Thin Straw -- Puree -- Mechanical Soft -- Regular -- Multi-consistency -- Pill -- Cervical Esophageal Comment -- Shanika I. Hardin Negus, [place], [place] Office number [phone] Pager [phone] Horton Marshall 12/12/2020, 12:04 PM              ECHOCARDIOGRAM COMPLETE  Result Date: 12/10/2020    ECHOCARDIOGRAM REPORT   Patient Name:   GLENWOOD REVOIR Date of Exam: 12/10/2020 Medical Rec #:  [id number]      Height:       67.0 in Accession #:    [id number]     Weight:       243.2 lb Date of Birth:  Mar 22, 1951      BSA:          2.197 m Patient Age:    3 years       BP:           111/73 mmHg Patient Gender: M              HR:           92 bpm. Exam Location:  Inpatient Procedure: 2D Echo, Color Doppler, Cardiac Doppler and Intracardiac            Opacification Agent Indications:    CHF-Acute Systolic H70.26  History:        Patient has prior history of Echocardiogram examinations, most                 recent 10/29/2020. Previous Myocardial Infarction and CAD,  Stroke, Arrythmias:Atrial Flutter; Risk Factors:Hypertension,                 Dyslipidemia and Diabetes.  Sonographer:    Bernadene Person RDCS Referring Phys: [place] Comments: No subcostal window. IMPRESSIONS  1. Left ventricular ejection fraction, by estimation, is 20 to 25%. The left ventricle has severely decreased function. The left ventricle demonstrates global hypokinesis. Left ventricular diastolic parameters are indeterminate.  2. Right ventricular systolic  function is normal. The right ventricular size is normal.  3. Left atrial size was mildly dilated.  4. The mitral valve is abnormal. Trivial mitral valve regurgitation. No evidence of mitral stenosis. Moderate mitral annular calcification.  5. The aortic valve is abnormal. There is moderate calcification of the aortic valve. There is moderate thickening of the aortic valve. Aortic valve regurgitation is not visualized. Mild to moderate aortic valve sclerosis/calcification is present, without any evidence of aortic stenosis.  6. The inferior vena cava is normal in size with greater than 50% respiratory variability, suggesting right atrial pressure of 3 mmHg. FINDINGS  Left Ventricle: Left ventricular ejection fraction, by estimation, is 20 to 25%. The left ventricle has severely decreased function. The left ventricle demonstrates global hypokinesis. Definity contrast agent was given IV to delineate the left ventricular endocardial borders. The left ventricular internal cavity size was normal in size. There is no left ventricular hypertrophy. Left ventricular diastolic parameters are indeterminate. Right Ventricle: The right ventricular size is normal. No increase in right ventricular wall thickness. Right ventricular systolic function is normal. Left Atrium: Left atrial size was mildly dilated. Right Atrium: Right atrial size was normal in size. Pericardium: There is no evidence of pericardial effusion. Mitral Valve: The mitral valve is abnormal. There is moderate thickening of the mitral valve leaflet(s). There is moderate calcification of the mitral valve leaflet(s). Moderate mitral annular calcification. Trivial mitral valve regurgitation. No evidence of mitral valve stenosis. Tricuspid Valve: The tricuspid valve is normal in structure. Tricuspid valve regurgitation is not demonstrated. No evidence of tricuspid stenosis. Aortic Valve: The aortic valve is abnormal. There is moderate calcification of the aortic  valve. There is moderate thickening of the aortic valve. Aortic valve regurgitation is not visualized. Mild to moderate aortic valve sclerosis/calcification is present, without any evidence of aortic stenosis. Pulmonic Valve: The pulmonic valve was normal in structure. Pulmonic valve regurgitation is not visualized. No evidence of pulmonic stenosis. Aorta: The aortic root is normal in size and structure. Venous: The inferior vena cava is normal in size with greater than 50% respiratory variability, suggesting right atrial pressure of 3 mmHg. IAS/Shunts: No atrial level shunt detected by color flow Doppler.  LEFT VENTRICLE PLAX 2D LVIDd:         5.30 cm LVIDs:         4.20 cm LV PW:         1.00 cm LV IVS:        1.00 cm LVOT diam:     2.10 cm LV SV:         63 LV SV Index:   28 LVOT Area:     3.46 cm  LV Volumes (MOD) LV vol d, MOD A2C: 130.0 ml LV vol d, MOD A4C: 140.0 ml LV vol s, MOD A2C: 87.5 ml LV vol s, MOD A4C: 89.9 ml LV SV MOD A2C:     42.5 ml LV SV MOD A4C:     140.0 ml LV SV MOD BP:  49.3 ml RIGHT VENTRICLE RV S prime:     6.00 cm/s TAPSE (M-mode): 1.5 cm LEFT ATRIUM             Index       RIGHT ATRIUM           Index LA diam:        4.00 cm 1.82 cm/m  RA Area:     10.70 cm LA Vol (A2C):   64.1 ml 29.17 ml/m RA Volume:   21.70 ml  9.88 ml/m LA Vol (A4C):   66.9 ml 30.45 ml/m LA Biplane Vol: 64.8 ml 29.49 ml/m  AORTIC VALVE LVOT Vmax:   108.98 cm/s LVOT Vmean:  71.025 cm/s LVOT VTI:    0.180 m  AORTA Ao Root diam: 3.60 cm Ao Asc diam:  3.40 cm  SHUNTS Systemic VTI:  0.18 m Systemic Diam: 2.10 cm Jenkins Rouge MD Electronically signed by Jenkins Rouge MD Signature Date/Time: 12/10/2020/11:16:26 AM    Final

## 2020-12-16 ENCOUNTER — Inpatient Hospital Stay (HOSPITAL_COMMUNITY): Payer: Medicare Other

## 2020-12-16 LAB — CBC WITH DIFFERENTIAL/PLATELET
Abs Immature Granulocytes: 0.32 10*3/uL — ABNORMAL HIGH (ref 0.00–0.07)
Basophils Absolute: 0 10*3/uL (ref 0.0–0.1)
Basophils Relative: 0 %
Eosinophils Absolute: 0.1 10*3/uL (ref 0.0–0.5)
Eosinophils Relative: 1 %
HCT: 26.3 % — ABNORMAL LOW (ref 39.0–52.0)
Hemoglobin: 8.8 g/dL — ABNORMAL LOW (ref 13.0–17.0)
Immature Granulocytes: 3 %
Lymphocytes Relative: 24 %
Lymphs Abs: 2.7 10*3/uL (ref 0.7–4.0)
MCH: 32.7 pg (ref 26.0–34.0)
MCHC: 33.5 g/dL (ref 30.0–36.0)
MCV: 97.8 fL (ref 80.0–100.0)
Monocytes Absolute: 0.7 10*3/uL (ref 0.1–1.0)
Monocytes Relative: 6 %
Neutro Abs: 7.7 10*3/uL (ref 1.7–7.7)
Neutrophils Relative %: 66 %
Platelets: 172 10*3/uL (ref 150–400)
RBC: 2.69 MIL/uL — ABNORMAL LOW (ref 4.22–5.81)
RDW: 15.8 % — ABNORMAL HIGH (ref 11.5–15.5)
WBC: 11.6 10*3/uL — ABNORMAL HIGH (ref 4.0–10.5)
nRBC: 0.2 % (ref 0.0–0.2)

## 2020-12-16 LAB — COMPREHENSIVE METABOLIC PANEL
ALT: 77 U/L — ABNORMAL HIGH (ref 0–44)
AST: 51 U/L — ABNORMAL HIGH (ref 15–41)
Albumin: 1.7 g/dL — ABNORMAL LOW (ref 3.5–5.0)
Alkaline Phosphatase: 81 U/L (ref 38–126)
Anion gap: 8 (ref 5–15)
BUN: 12 mg/dL (ref 8–23)
CO2: 20 mmol/L — ABNORMAL LOW (ref 22–32)
Calcium: 7.9 mg/dL — ABNORMAL LOW (ref 8.9–10.3)
Chloride: 106 mmol/L (ref 98–111)
Creatinine, Ser: 0.78 mg/dL (ref 0.61–1.24)
GFR, Estimated: >60 mL/min (ref >60)
Glucose, Bld: 92 mg/dL (ref 70–99)
Potassium: 4.2 mmol/L (ref 3.5–5.1)
Sodium: 134 mmol/L — ABNORMAL LOW (ref 135–145)
Total Bilirubin: 0.8 mg/dL (ref 0.3–1.2)
Total Protein: 5.8 g/dL — ABNORMAL LOW (ref 6.5–8.1)

## 2020-12-16 LAB — GLUCOSE, CAPILLARY
Glucose-Capillary: 95 mg/dL (ref 70–99)
Glucose-Capillary: 113 mg/dL — ABNORMAL HIGH (ref 70–99)
Glucose-Capillary: 135 mg/dL — ABNORMAL HIGH (ref 70–99)
Glucose-Capillary: 111 mg/dL — ABNORMAL HIGH (ref 70–99)

## 2020-12-16 LAB — C-REACTIVE PROTEIN: CRP: 9.8 mg/dL — ABNORMAL HIGH (ref <1.0)

## 2020-12-16 LAB — BRAIN NATRIURETIC PEPTIDE: B Natriuretic Peptide: 1385.6 pg/mL — ABNORMAL HIGH (ref 0.0–100.0)

## 2020-12-16 LAB — MAGNESIUM: Magnesium: 1.4 mg/dL — ABNORMAL LOW (ref 1.7–2.4)

## 2020-12-16 LAB — PROCALCITONIN: Procalcitonin: 0.25 ng/mL

## 2020-12-16 MED ORDER — MAGNESIUM SULFATE 4 GM/100ML IV SOLN
4.0000 g | Freq: Once | INTRAVENOUS | Status: AC
  Administered 2020-12-16: 4 g via INTRAVENOUS
  Filled 2020-12-16: qty 100

## 2020-12-16 MED ORDER — LACTATED RINGERS IV BOLUS
500.0000 mL | Freq: Once | INTRAVENOUS | Status: AC
  Administered 2020-12-16: 500 mL via INTRAVENOUS

## 2020-12-16 NOTE — Discharge Instructions (Signed)

## 2020-12-16 NOTE — Progress Notes (Signed)
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 Phase Impaired Oral - Pudding Teaspoon -- Oral - Pudding Cup -- Oral - Honey Teaspoon -- Oral - Honey Cup -- Oral - Nectar Teaspoon -- Oral - Nectar Cup Lingual pumping;Delayed oral transit;Reduced posterior propulsion Oral - Nectar Straw Lingual pumping;Delayed oral transit;Reduced posterior propulsion Oral - Thin Teaspoon -- Oral - Thin Cup Lingual pumping;Delayed oral transit;Reduced posterior propulsion Oral - Thin Straw Lingual pumping;Delayed oral transit;Reduced posterior propulsion Oral - Puree Lingual pumping;Reduced posterior propulsion Oral - Mech Soft Lingual pumping;Reduced posterior propulsion Oral - Regular -- Oral - Multi-Consistency -- Oral - Pill Lingual pumping;Delayed oral transit;Reduced posterior propulsion Oral Phase - Comment --  CHL IP PHARYNGEAL PHASE 12/12/2020  Pharyngeal Phase Impaired Pharyngeal- Pudding Teaspoon -- Pharyngeal -- Pharyngeal- Pudding Cup -- Pharyngeal --  Pharyngeal- Honey Teaspoon -- Pharyngeal -- Pharyngeal- Honey Cup -- Pharyngeal -- Pharyngeal- Nectar Teaspoon -- Pharyngeal -- Pharyngeal- Nectar Cup Pharyngeal residue - valleculae;Reduced tongue base retraction Pharyngeal -- Pharyngeal- Nectar Straw Delayed swallow initiation-vallecula;Pharyngeal residue - valleculae;Reduced tongue base retraction;Penetration/Aspiration during swallow;Penetration/Apiration after swallow Pharyngeal Material enters airway, CONTACTS cords and not ejected out;Material enters airway, passes BELOW cords and not ejected out despite cough attempt by patient Pharyngeal- Thin Teaspoon -- Pharyngeal -- Pharyngeal- Thin Cup Delayed swallow initiation-vallecula;Pharyngeal residue - valleculae;Delayed swallow initiation-pyriform sinuses;Penetration/Aspiration during swallow Pharyngeal Material enters airway, remains ABOVE vocal cords and not ejected out;Material enters airway, CONTACTS cords and then ejected out;Material enters airway, CONTACTS cords and not ejected out;Material enters airway, passes BELOW cords and not ejected out despite cough attempt by patient Pharyngeal- Thin Straw Delayed swallow initiation-vallecula;Pharyngeal residue - valleculae;Delayed swallow initiation-pyriform sinuses;Reduced tongue base retraction;Penetration/Aspiration during swallow;Penetration/Apiration after swallow Pharyngeal Material enters airway, remains ABOVE vocal cords and not ejected out;Material enters airway, CONTACTS cords and then ejected out;Material enters airway, CONTACTS cords and not ejected out;Material enters airway, passes BELOW cords and not ejected out despite cough attempt by patient Pharyngeal- Puree Pharyngeal residue - valleculae;Reduced tongue base retraction Pharyngeal -- Pharyngeal- Mechanical Soft Pharyngeal residue - valleculae;Reduced tongue base retraction  Pharyngeal -- Pharyngeal- Regular -- Pharyngeal -- Pharyngeal- Multi-consistency -- Pharyngeal -- Pharyngeal- Pill Pharyngeal residue - valleculae;Reduced tongue base retraction Pharyngeal -- Pharyngeal Comment --  CHL IP CERVICAL ESOPHAGEAL PHASE 12/12/2020 Cervical Esophageal Phase WFL Pudding Teaspoon -- Pudding Cup -- Honey Teaspoon -- Honey Cup -- Nectar Teaspoon -- Nectar Cup -- Nectar Straw -- Thin Teaspoon -- Thin Cup -- Thin Straw -- Puree -- Mechanical Soft -- Regular -- Multi-consistency -- Pill -- Cervical Esophageal Comment -- Shanika I. Hardin Negus, [place], [place] Office number [phone] Pager [phone] Horton Marshall 12/12/2020, 12:04 PM              ECHOCARDIOGRAM COMPLETE  Result Date: 12/10/2020    ECHOCARDIOGRAM REPORT   Patient Name:   JAYSTIN MCGARVEY Date of Exam: 12/10/2020 Medical Rec #:  [id number]      Height:       67.0 in Accession #:    [id number]     Weight:       243.2 lb Date of Birth:  1950-11-13      BSA:          2.197 m Patient Age:    45 years       BP:           111/73 mmHg Patient Gender: M              HR:           92 bpm. Exam Location:  Inpatient Procedure: 2D Echo, Color Doppler, Cardiac Doppler and Intracardiac            Opacification Agent Indications:    CHF-Acute Systolic H96.22  History:        Patient has prior history of Echocardiogram examinations, most                 recent 10/29/2020. Previous Myocardial Infarction and CAD,                 Stroke, Arrythmias:Atrial Flutter; Risk Factors:Hypertension,                 Dyslipidemia and Diabetes.  Sonographer:    Bernadene Person RDCS Referring Phys: [place] Comments: No subcostal  window. IMPRESSIONS  1. Left ventricular ejection fraction, by estimation, is 20 to 25%. The left ventricle has severely decreased function. The left ventricle demonstrates global hypokinesis. Left ventricular diastolic parameters are indeterminate.  2. Right ventricular systolic  function is normal. The right ventricular size is normal.  3. Left atrial size was mildly dilated.  4. The mitral valve is abnormal. Trivial mitral valve regurgitation. No evidence of mitral stenosis. Moderate mitral annular calcification.  5. The aortic valve is abnormal. There is moderate calcification of the aortic valve. There is moderate thickening of the aortic valve. Aortic valve regurgitation is not visualized. Mild to moderate aortic valve sclerosis/calcification is present, without any evidence of aortic stenosis.  6. The inferior vena cava is normal in size with greater than 50% respiratory variability, suggesting right atrial pressure of 3 mmHg. FINDINGS  Left Ventricle: Left ventricular ejection fraction, by estimation, is 20 to 25%. The left ventricle has severely decreased function. The left ventricle demonstrates global hypokinesis. Definity contrast agent was given IV to delineate the left ventricular endocardial borders. The left ventricular internal cavity size was normal in size. There is no left ventricular hypertrophy. Left ventricular diastolic parameters are indeterminate. Right Ventricle: The right ventricular size is normal. No increase in right ventricular wall thickness. Right ventricular systolic function is normal. Left Atrium: Left atrial size was mildly dilated. Right Atrium: Right atrial size was normal in size. Pericardium: There is no evidence of pericardial effusion. Mitral Valve: The mitral valve is abnormal. There is moderate thickening of the mitral valve leaflet(s). There is moderate calcification of the mitral valve leaflet(s). Moderate mitral annular calcification. Trivial mitral valve regurgitation. No evidence of mitral valve stenosis. Tricuspid Valve: The tricuspid valve is normal in structure. Tricuspid valve regurgitation is not demonstrated. No evidence of tricuspid stenosis. Aortic Valve: The aortic valve is abnormal. There is moderate calcification of the aortic  valve. There is moderate thickening of the aortic valve. Aortic valve regurgitation is not visualized. Mild to moderate aortic valve sclerosis/calcification is present, without any evidence of aortic stenosis. Pulmonic Valve: The pulmonic valve was normal in structure. Pulmonic valve regurgitation is not visualized. No evidence of pulmonic stenosis. Aorta: The aortic root is normal in size and structure. Venous: The inferior vena cava is normal in size with greater than 50% respiratory variability, suggesting right atrial pressure of 3 mmHg. IAS/Shunts: No atrial level shunt detected by color flow Doppler.  LEFT VENTRICLE PLAX 2D LVIDd:         5.30 cm LVIDs:         4.20 cm LV PW:         1.00 cm LV IVS:        1.00 cm LVOT diam:     2.10 cm LV SV:         63 LV SV Index:   28 LVOT Area:     3.46 cm  LV Volumes (MOD) LV vol d, MOD A2C: 130.0 ml LV vol d, MOD A4C: 140.0 ml LV vol s, MOD A2C: 87.5 ml LV vol s, MOD A4C: 89.9 ml LV SV MOD A2C:     42.5 ml LV SV MOD A4C:     140.0 ml LV SV MOD BP:      49.3 ml RIGHT VENTRICLE RV S prime:     6.00 cm/s TAPSE (M-mode): 1.5 cm LEFT ATRIUM             Index       RIGHT ATRIUM  Index LA diam:        4.00 cm 1.82 cm/m  RA Area:     10.70 cm LA Vol (A2C):   64.1 ml 29.17 ml/m RA Volume:   21.70 ml  9.88 ml/m LA Vol (A4C):   66.9 ml 30.45 ml/m LA Biplane Vol: 64.8 ml 29.49 ml/m  AORTIC VALVE LVOT Vmax:   108.98 cm/s LVOT Vmean:  71.025 cm/s LVOT VTI:    0.180 m  AORTA Ao Root diam: 3.60 cm Ao Asc diam:  3.40 cm  SHUNTS Systemic VTI:  0.18 m Systemic Diam: 2.10 cm Jenkins Rouge MD Electronically signed by Jenkins Rouge MD Signature Date/Time: 12/10/2020/11:16:26 AM    Final

## 2020-12-16 NOTE — Progress Notes (Signed)
Ok to stop cefepime after doses today per Dr Candiss Norse.  Onnie Boer, PharmD, BCIDP, AAHIVP, CPP Infectious Disease Pharmacist 12/16/2020 11:20 AM

## 2020-12-16 NOTE — Plan of Care (Signed)
  Problem: Education: Goal: Knowledge of General Education information will improve Description: Including pain rating scale, medication(s)/side effects and non-pharmacologic comfort measures Outcome: Progressing   Problem: Activity: Goal: Risk for activity intolerance will decrease Outcome: Progressing   Problem: Nutrition: Goal: Adequate nutrition will be maintained Outcome: Progressing   Problem: Coping: Goal: Level of anxiety will decrease Outcome: Progressing   Problem: Pain Managment: Goal: General experience of comfort will improve Outcome: Progressing   Problem: Safety: Goal: Ability to remain free from injury will improve Outcome: Progressing   No acute events took place overnight, vitals remain stable. Will continue to monitor.

## 2020-12-16 NOTE — TOC Initial Note (Signed)
Transition of Care [facility]) - Initial/Assessment Note    Patient Details  Name: [name] MRN: [id number] Date of Birth: Mar 13, 1951  [facility] CM/SW Contact:    Trula Ore, [place] Phone Number: 12/16/2020, 3:18 PM  Clinical Narrative:                   CSW received consult for possible SNF placement at time of discharge. CSW spoke with patient and patients spouse regarding PT recommendation of SNF placement at time of discharge.Patient wanted CSW to speak with spouce regarding SNF placement. CSW spoke with patients spouse and patients spouse expressed understanding of PT recommendation and is agreeable to SNF placement at time of discharge. Patient spouse confirmed that patient came from [facility] short term and would like for CSW to fax out to other facilitys.Patients spouse gave CSW permission to fax out initial referral near [place], [place], [place], and [place]. Patient has received the COVID vaccines as well as the booster.  No further questions reported at this time. CSW to continue to follow and assist with discharge planning needs.  Expected Discharge Plan: Skilled Nursing Facility Barriers to Discharge: Continued Medical Work up   Patient Goals and CMS Choice Patient states their goals for this hospitalization and ongoing recovery are:: to go to SNF CMS Medicare.gov Compare Post Acute Care list provided to:: Patient Represenative (must comment) (Spouse [name]) Choice offered to / list presented to : Spouse [name])  Expected Discharge Plan and Services Expected Discharge Plan: [place] In-house Referral: Clinical Social Work     Living arrangements for the past 2 months: [place]                                      Prior Living Arrangements/Services Living arrangements for the past 2 months: [place] Lives with:: Self,Facility Resident (came from [facility] short term) Patient language  and need for interpreter reviewed:: Yes Do you feel safe going back to the place where you live?: No   SNF  Need for Family Participation in Patient Care: Yes (Comment) Care giver support system in place?: Yes (comment)   Criminal Activity/Legal Involvement Pertinent to Current Situation/Hospitalization: No - Comment as needed  Activities of Daily Living      Permission Sought/Granted Permission sought to share information with : Case Manager,Family Chief Financial Officer Permission granted to share information with : Yes, Verbal Permission Granted  Share Information with NAME: [name]  Permission granted to share info w AGENCY: SNF  Permission granted to share info w Relationship: spouse  Permission granted to share info w Contact Information: wanda [phone]  Emotional Assessment   Attitude/Demeanor/Rapport: Gracious Affect (typically observed): Calm Orientation: : Oriented to Self,Oriented to Place,Oriented to Situation Alcohol / Substance Use: Not Applicable Psych Involvement: No (comment)  Admission diagnosis:  Septic shock ([place]) [A41.9, R65.21] Patient Active Problem List   Diagnosis Date Noted  . Pressure injury of skin 12/11/2020  . Septic shock ([place])   . Acute respiratory failure with hypoxia ([place])   . Multifocal pneumonia   . Palliative care by specialist   . Goals of care, counseling/discussion   . Osteomyelitis of left foot ([place])   . Cardiomyopathy ([place])   . PAD (peripheral artery disease) /SMA Stenosis/Rt and Lt SFA Stenosis/ 10/28/2020  . SIRS (systemic inflammatory response syndrome) ([place]) 10/26/2020  . Sepsis --due to Infected Lt  Foot/Ostepmyleitis---POA 10/26/2020  . Acute osteomyelitis of toe, left ([place]) 10/25/2020  . Diabetic ulcer of left heel ([place]) 10/25/2020  . Fracture of left toe 10/25/2020  . Leukocytosis 10/25/2020  . Hypokalemia 10/25/2020  . Hypoalbuminemia 10/25/2020  . Hyperglycemia due to diabetes mellitus ([place])  10/25/2020  . History of stroke 10/25/2020  . Abdominal pain, epigastric 02/09/2017  . Morbid obesity ([place]) 09/17/2016  . Sedentary lifestyle 11/20/2015  . Type 2 diabetes mellitus with vascular disease ([place]) 07/25/2015  . Bilateral carotid artery disease ([place]) 05/01/2015  . Atherosclerosis of artery of extremity with ulceration ([place]) 04/08/2015  . Lower extremity edema 02/11/2015  . Tobacco use disorder 07/16/2014  . Chest pain 06/30/2014  . OSA on CPAP 06/30/2014  . Aftercare following surgery of the circulatory system, [place] 12/27/2013  . Chronic pain syndrome 11/03/2013  . Contracture of muscle of left upper arm 10/10/2013  . Foot fracture 10/10/2013  . Generalized OA 10/10/2013  . Anemia, iron deficiency 09/26/2013  . Insomnia 09/26/2013  . Stasis dermatitis 07/31/2013  . Peripheral autonomic neuropathy due to diabetes mellitus ([place]) 07/31/2013  . RLS (restless legs syndrome) 07/31/2013  . Allergic rhinitis 07/28/2013  . CAD, multiple vessel, 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 06/21/2013  . Essential hypertension 06/21/2013  . GERD (gastroesophageal reflux disease) 06/21/2013  . BPH (benign prostatic hyperplasia) 06/21/2013  . Cardiomyopathy, ischemic 02/22/2013  . Mixed hyperlipidemia 02/22/2013  . Atrial flutter ([place]) 01/02/2013  . Acute myocardial infarction, subendocardial infarction, initial episode of care ([place]) 01/02/2013  . Occlusion and stenosis of carotid artery without mention of cerebral infarction 10/27/2012  . CVA (cerebral infarction) 06/03/2012   PCP:  Caprice Renshaw, MD Pharmacy:   [facility], [place] [address] Phone: [phone] Fax: [phone]     Social Determinants of Health (SDOH) Interventions    Readmission Risk Interventions No flowsheet data found.

## 2020-12-17 LAB — CBC WITH DIFFERENTIAL/PLATELET
Abs Immature Granulocytes: 0.32 10*3/uL — ABNORMAL HIGH (ref 0.00–0.07)
Basophils Absolute: 0.1 10*3/uL (ref 0.0–0.1)
Basophils Relative: 0 %
Eosinophils Absolute: 0.1 10*3/uL (ref 0.0–0.5)
Eosinophils Relative: 1 %
HCT: 28.5 % — ABNORMAL LOW (ref 39.0–52.0)
Hemoglobin: 9.3 g/dL — ABNORMAL LOW (ref 13.0–17.0)
Immature Granulocytes: 2 %
Lymphocytes Relative: 20 %
Lymphs Abs: 2.7 10*3/uL (ref 0.7–4.0)
MCH: 31.6 pg (ref 26.0–34.0)
MCHC: 32.6 g/dL (ref 30.0–36.0)
MCV: 96.9 fL (ref 80.0–100.0)
Monocytes Absolute: 0.7 10*3/uL (ref 0.1–1.0)
Monocytes Relative: 5 %
Neutro Abs: 9.5 10*3/uL — ABNORMAL HIGH (ref 1.7–7.7)
Neutrophils Relative %: 72 %
Platelets: 209 10*3/uL (ref 150–400)
RBC: 2.94 MIL/uL — ABNORMAL LOW (ref 4.22–5.81)
RDW: 15.9 % — ABNORMAL HIGH (ref 11.5–15.5)
WBC: 13.4 10*3/uL — ABNORMAL HIGH (ref 4.0–10.5)
nRBC: 0.1 % (ref 0.0–0.2)

## 2020-12-17 LAB — COMPREHENSIVE METABOLIC PANEL
ALT: 71 U/L — ABNORMAL HIGH (ref 0–44)
AST: 41 U/L (ref 15–41)
Albumin: 1.8 g/dL — ABNORMAL LOW (ref 3.5–5.0)
Alkaline Phosphatase: 90 U/L (ref 38–126)
Anion gap: 9 (ref 5–15)
BUN: 9 mg/dL (ref 8–23)
CO2: 21 mmol/L — ABNORMAL LOW (ref 22–32)
Calcium: 8.2 mg/dL — ABNORMAL LOW (ref 8.9–10.3)
Chloride: 106 mmol/L (ref 98–111)
Creatinine, Ser: 0.76 mg/dL (ref 0.61–1.24)
GFR, Estimated: >60 mL/min (ref >60)
Glucose, Bld: 92 mg/dL (ref 70–99)
Potassium: 4.1 mmol/L (ref 3.5–5.1)
Sodium: 136 mmol/L (ref 135–145)
Total Bilirubin: 1 mg/dL (ref 0.3–1.2)
Total Protein: 6 g/dL — ABNORMAL LOW (ref 6.5–8.1)

## 2020-12-17 LAB — GLUCOSE, CAPILLARY
Glucose-Capillary: 85 mg/dL (ref 70–99)
Glucose-Capillary: 226 mg/dL — ABNORMAL HIGH (ref 70–99)
Glucose-Capillary: 112 mg/dL — ABNORMAL HIGH (ref 70–99)
Glucose-Capillary: 96 mg/dL (ref 70–99)
Glucose-Capillary: 97 mg/dL (ref 70–99)

## 2020-12-17 LAB — MAGNESIUM: Magnesium: 1.9 mg/dL (ref 1.7–2.4)

## 2020-12-17 LAB — BRAIN NATRIURETIC PEPTIDE: B Natriuretic Peptide: 1317.3 pg/mL — ABNORMAL HIGH (ref 0.0–100.0)

## 2020-12-17 LAB — C-REACTIVE PROTEIN: CRP: 11.8 mg/dL — ABNORMAL HIGH (ref <1.0)

## 2020-12-17 MED ORDER — MAGNESIUM SULFATE IN D5W 1-5 GM/100ML-% IV SOLN
1.0000 g | Freq: Once | INTRAVENOUS | Status: AC
  Administered 2020-12-17: 1 g via INTRAVENOUS
  Filled 2020-12-17: qty 100

## 2020-12-17 MED ORDER — METOPROLOL TARTRATE 50 MG PO TABS
50.0000 mg | ORAL_TABLET | Freq: Two times a day (BID) | ORAL | Status: DC
  Administered 2020-12-17 – 2020-12-18 (×3): 50 mg via ORAL
  Filled 2020-12-17 (×3): qty 1

## 2020-12-17 MED ORDER — FUROSEMIDE 40 MG PO TABS
40.0000 mg | ORAL_TABLET | Freq: Once | ORAL | Status: AC
  Administered 2020-12-17: 40 mg via ORAL
  Filled 2020-12-17: qty 1

## 2020-12-17 NOTE — Progress Notes (Signed)
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 remains ABOVE vocal cords and not ejected out;Material enters airway, CONTACTS cords and then ejected out;Material enters airway, CONTACTS cords and not ejected out;Material enters airway, passes BELOW cords and not ejected out despite cough attempt by patient Pharyngeal- Thin Straw Delayed swallow initiation-vallecula;Pharyngeal residue - valleculae;Delayed swallow initiation-pyriform sinuses;Reduced tongue base retraction;Penetration/Aspiration during swallow;Penetration/Apiration after swallow Pharyngeal Material enters airway, remains ABOVE vocal cords and not ejected out;Material enters airway, CONTACTS cords and then ejected out;Material enters airway, CONTACTS cords and not ejected out;Material enters airway, passes BELOW cords and not ejected out despite cough attempt by patient Pharyngeal- Puree Pharyngeal residue - valleculae;Reduced tongue base retraction Pharyngeal -- Pharyngeal-  Mechanical Soft Pharyngeal residue - valleculae;Reduced tongue base retraction Pharyngeal -- Pharyngeal- Regular -- Pharyngeal -- Pharyngeal- Multi-consistency -- Pharyngeal -- Pharyngeal- Pill Pharyngeal residue - valleculae;Reduced tongue base retraction Pharyngeal -- Pharyngeal Comment --  CHL IP CERVICAL ESOPHAGEAL PHASE 12/12/2020 Cervical Esophageal Phase WFL Pudding Teaspoon -- Pudding Cup -- Honey Teaspoon -- Honey Cup -- [employer]  Teaspoon -- Nectar Cup -- Nectar Straw -- Thin Teaspoon -- Thin Cup -- Thin Straw -- Puree -- Mechanical Soft -- Regular -- Multi-consistency -- Pill -- Cervical Esophageal Comment -- Shanika I. Hardin Negus, [place], [place] Office number [phone] Pager [phone] Horton Marshall 12/12/2020, 12:04 PM              ECHOCARDIOGRAM COMPLETE  Result Date: 12/10/2020    ECHOCARDIOGRAM REPORT   Patient Name:   HERRON FERO Date of Exam: 12/10/2020 Medical Rec #:  [id number]      Height:       67.0 in Accession #:    [id number]     Weight:       243.2 lb Date of Birth:  09/26/1951      BSA:          2.197 m Patient Age:    82 years       BP:           111/73 mmHg Patient Gender: M              HR:           92 bpm. Exam Location:  Inpatient Procedure: 2D Echo, Color Doppler, Cardiac Doppler and Intracardiac            Opacification Agent Indications:    CHF-Acute Systolic Y69.48  History:        Patient has prior history of Echocardiogram examinations, most                 recent 10/29/2020. Previous Myocardial Infarction and CAD,                 Stroke, Arrythmias:Atrial Flutter; Risk Factors:Hypertension,                 Dyslipidemia and Diabetes.  Sonographer:    Bernadene Person RDCS Referring Phys: [place] Comments: No subcostal window. IMPRESSIONS  1. Left ventricular ejection fraction, by estimation, is 20 to 25%. The left ventricle has severely decreased function. The left ventricle demonstrates global hypokinesis. Left  ventricular diastolic parameters are indeterminate.  2. Right ventricular systolic function is normal. The right ventricular size is normal.  3. Left atrial size was mildly dilated.  4. The mitral valve is abnormal. Trivial mitral valve regurgitation. No evidence of mitral stenosis. Moderate mitral annular calcification.  5. The aortic valve is abnormal. There is moderate calcification of the aortic valve. There is moderate thickening of the aortic valve. Aortic valve regurgitation is not visualized. Mild to moderate aortic valve sclerosis/calcification is present, without any evidence of aortic stenosis.  6. The inferior vena cava is normal in size with greater than 50% respiratory variability, suggesting right atrial pressure of 3 mmHg. FINDINGS  Left Ventricle: Left ventricular ejection fraction, by estimation, is 20 to 25%. The left ventricle has severely decreased function. The left ventricle demonstrates global hypokinesis. Definity contrast agent was given IV to delineate the left ventricular endocardial borders. The left ventricular internal cavity size was normal in size. There is no left ventricular hypertrophy. Left ventricular diastolic parameters are indeterminate. Right Ventricle: The right ventricular size is normal. No increase in right ventricular wall thickness. Right ventricular systolic function is normal. Left Atrium: Left atrial size was mildly dilated. Right Atrium: Right atrial size was normal in size. Pericardium: There is no evidence of pericardial effusion. Mitral Valve: The mitral valve is abnormal. There is moderate thickening of the mitral valve  leaflet(s). There is moderate calcification of the mitral valve leaflet(s). Moderate mitral annular calcification. Trivial mitral valve regurgitation. No evidence of mitral valve stenosis. Tricuspid Valve: The tricuspid valve is normal in structure. Tricuspid valve regurgitation is not demonstrated. No evidence of tricuspid stenosis. Aortic  Valve: The aortic valve is abnormal. There is moderate calcification of the aortic valve. There is moderate thickening of the aortic valve. Aortic valve regurgitation is not visualized. Mild to moderate aortic valve sclerosis/calcification is present, without any evidence of aortic stenosis. Pulmonic Valve: The pulmonic valve was normal in structure. Pulmonic valve regurgitation is not visualized. No evidence of pulmonic stenosis. Aorta: The aortic root is normal in size and structure. Venous: The inferior vena cava is normal in size with greater than 50% respiratory variability, suggesting right atrial pressure of 3 mmHg. IAS/Shunts: No atrial level shunt detected by color flow Doppler.  LEFT VENTRICLE PLAX 2D LVIDd:         5.30 cm LVIDs:         4.20 cm LV PW:         1.00 cm LV IVS:        1.00 cm LVOT diam:     2.10 cm LV SV:         63 LV SV Index:   28 LVOT Area:     3.46 cm  LV Volumes (MOD) LV vol d, MOD A2C: 130.0 ml LV vol d, MOD A4C: 140.0 ml LV vol s, MOD A2C: 87.5 ml LV vol s, MOD A4C: 89.9 ml LV SV MOD A2C:     42.5 ml LV SV MOD A4C:     140.0 ml LV SV MOD BP:      49.3 ml RIGHT VENTRICLE RV S prime:     6.00 cm/s TAPSE (M-mode): 1.5 cm LEFT ATRIUM             Index       RIGHT ATRIUM           Index LA diam:        4.00 cm 1.82 cm/m  RA Area:     10.70 cm LA Vol (A2C):   64.1 ml 29.17 ml/m RA Volume:   21.70 ml  9.88 ml/m LA Vol (A4C):   66.9 ml 30.45 ml/m LA Biplane Vol: 64.8 ml 29.49 ml/m  AORTIC VALVE LVOT Vmax:   108.98 cm/s LVOT Vmean:  71.025 cm/s LVOT VTI:    0.180 m  AORTA Ao Root diam: 3.60 cm Ao Asc diam:  3.40 cm  SHUNTS Systemic VTI:  0.18 m Systemic Diam: 2.10 cm Jenkins Rouge MD Electronically signed by Jenkins Rouge MD Signature Date/Time: 12/10/2020/11:16:26 AM    Final

## 2020-12-17 NOTE — TOC Progression Note (Signed)
Transition of Care [facility]) - Progression Note    Patient Details  Name: [name] MRN: [id number] Date of Birth: November 26, 1950  [facility] CM/SW [place], LCSW Phone Number: 12/17/2020, 3:38 PM  Clinical Narrative:    CSW spoke with patient's spouse and offered SNF choice of [facility] since they are requesting a different facility than [facility]. Patient's spouse is accepting of [facility]. CSW obtained insurance approval: #[id number], [id number]#[id number], effective 12/17/20-12/20/20. Will follow for potential discharge tomorrow.     Expected Discharge Plan: [place] Barriers to Discharge: Continued Medical Work up  Expected Discharge Plan and Services Expected Discharge Plan: [place] In-house Referral: Clinical Social Work     Living arrangements for the past 2 months: [place]                                       Social Determinants of Health (SDOH) Interventions    Readmission Risk Interventions No flowsheet data found.

## 2020-12-17 NOTE — Progress Notes (Signed)
   12/17/20 0951  Assess: MEWS Score  Temp 98.6 F (37 C)  BP (!) 143/87  Pulse Rate (!) 110  ECG Heart Rate (!) 112  Resp 17  Level of Consciousness Alert  SpO2 95 %  O2 Device Room Air  Patient Activity (if Appropriate) In bed  Assess: MEWS Score  MEWS Temp 0  MEWS Systolic 0  MEWS Pulse 2  MEWS RR 0  MEWS LOC 0  MEWS Score 2  MEWS Score Color Yellow  Assess: if the MEWS score is Yellow or Red  Were vital signs taken at a resting state? Yes  Focused Assessment No change from prior assessment  Early Detection of Sepsis Score *See Row Information* Low  MEWS guidelines implemented *See Row Information* Yes  Treat  MEWS Interventions Administered scheduled meds/treatments  Pain Scale 0-10  Pain Score 2  Pain Type Surgical pain  Pain Location Leg  Pain Orientation Left  Pain Descriptors / Indicators Aching  Pain Frequency Constant  Patients Stated Pain Goal 3  Pain Intervention(s) Repositioned  Take Vital Signs  Increase Vital Sign Frequency  Yellow: Q 2hr X 2 then Q 4hr X 2, if remains yellow, continue Q 4hrs  Escalate  MEWS: Escalate Yellow: discuss with charge nurse/RN and consider discussing with provider and RRT  Notify: Charge Nurse/RN  Name of Charge Nurse/RN Notified Rudene Anda K  Date Charge Nurse/RN Notified 12/17/20  Time Charge Nurse/RN Notified 1000  Notify: Provider  Provider Name/Title Candiss Norse  Date Provider Notified 12/17/20  Time Provider Notified 1006  Notification Type Page  Notification Reason Other (Comment) (Yellow MEWS, per protocol)  Provider response No new orders  Date of Provider Response 12/17/20  Time of Provider Response 1016

## 2020-12-17 NOTE — Progress Notes (Addendum)
Occupational Therapy Treatment Patient Details Name: [name] MRN: [id number] DOB: 1951-02-06 Today's Date: 12/17/2020    History of present illness 70 yo admitted from [facility] 5/32 with sepsis and respiratory failure. Intubated 2/28 - 3/2. 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 Session focus on functional mobility as precursor to higher level BADLs. Pt currently requires MIN A +2 for bed mobility and at least min guard assist for static sitting balance. Pt able to complete lateral scoot transfer to recliner with MAX- total A +2 to pts R side. Pt required total A as pt fatigued. Pt continues to present with LUE weakness, impaired balance and generalized deconditioning. Reapplied L palm guard to pts L hand to protect skin integrity d/t LUE flexion contracture ( alerted RN to doff palm guard once per shift to check for skin break down or redness). Pt would continue to benefit from skilled occupational therapy while admitted and after d/c to address the below listed limitations in order to improve overall functional mobility and facilitate independence with BADL participation. DC plan remains appropriate, will follow acutely per POC.     Follow Up Recommendations  SNF    Equipment Recommendations  None recommended by OT    Recommendations for Other Services      Precautions / Restrictions Precautions Precautions: Fall;Other (comment) Precaution Comments: LUE hemiparesis w/ contracture, Lt AKA Restrictions Weight Bearing Restrictions: Yes LLE Weight Bearing: Non weight bearing Other Position/Activity Restrictions: S/p L AKA 11/11/20       Mobility Bed Mobility Overal bed mobility: Needs Assistance Bed Mobility: Supine to Sit     Supine to sit: Min assist;HOB elevated;+2 for physical assistance     General bed  mobility comments: pt able to transition to EOB from supine with MIN A +2 needing most assist to scoot hips to EOB and elevate trunk into sitting    Transfers Overall transfer level: Needs assistance Equipment used: 2 person hand held assist Transfers: Lateral/Scoot Transfers          Lateral/Scoot Transfers: Total assist;+2 physical assistance;Max assist General transfer comment: pt completed lateral scoot transfer from EOB>drop arm recliner to pts R side with MAX- total A +2, with pt needing tactile cues to shift weight forward durign transfer as pt with tendency to lean posteriorly. pt with difficulty pushing through RLE during transfer    Balance Overall balance assessment: Needs assistance Sitting-balance support: Single extremity supported;Feet supported Sitting balance-Leahy Scale: Poor Sitting balance - Comments: at least min guard for static sitting balance EOB                                   ADL either performed or assessed with clinical judgement   ADL Overall ADL's : Needs assistance/impaired                     Lower Body Dressing: Total assistance;Bed level Lower Body Dressing Details (indicate cue type and reason): to don sock from bed level Toilet Transfer: +2 for safety/equipment;Maximal assistance Toilet Transfer Details (indicate cue type and reason): simulated via lateral scoot transfer to drop arm recliner         Functional mobility during ADLs: +2 for physical assistance;Maximal assistance (lateral scoot transfer to drop arm  recliner) General ADL Comments: pt continues to present with LUE weakness, impaired balance and generalized deconditioning     Vision       Perception     Praxis      Cognition Arousal/Alertness: Awake/alert Behavior During Therapy: WFL for tasks assessed/performed Overall Cognitive Status: No family/caregiver present to determine baseline cognitive functioning Area of Impairment:  Attention;Memory;Following commands;Safety/judgement;Awareness;Problem solving                   Current Attention Level: Sustained Memory: Decreased short-term memory (reports at home he walked to his PWC) Following Commands: Follows one step commands inconsistently;Follows one step commands with increased time Safety/Judgement: Decreased awareness of safety;Decreased awareness of deficits Awareness: Intellectual Problem Solving: Difficulty sequencing;Requires verbal cues;Decreased initiation;Requires tactile cues;Slow processing General Comments: decreased attention and memory unable to recall how he was working on transfer at [facility] and states he came to hospital from home and he walking to his PWC at home        Exercises     Shoulder Instructions       General Comments BP 136/80 from recliner    Pertinent Vitals/ Pain       Pain Assessment: No/denies pain  Home Living                                          Prior Functioning/Environment              Frequency  Min 2X/week        Progress Toward Goals  OT Goals(current goals can now be found in the care plan section)  Progress towards OT goals: Progressing toward goals  Acute Rehab OT Goals Patient Stated Goal: to get to chair OT Goal Formulation: With patient Time For Goal Achievement: 12/26/20 Potential to Achieve Goals: Good  Plan Discharge plan remains appropriate;Frequency remains appropriate    Co-evaluation                 AM-PAC OT "6 Clicks" Daily Activity     Outcome Measure   Help from another person eating meals?: A Lot Help from another person taking care of personal grooming?: A Lot Help from another person toileting, which includes using toliet, bedpan, or urinal?: Total Help from another person bathing (including washing, rinsing, drying)?: Total Help from another person to put on and taking off regular upper body clothing?: A Lot Help from another person  to put on and taking off regular lower body clothing?: Total 6 Click Score: 9    End of Session Equipment Utilized During Treatment: Gait belt  OT Visit Diagnosis: Unsteadiness on feet (R26.81);Other symptoms and signs involving cognitive function;Muscle weakness (generalized) (M62.81);Other abnormalities of gait and mobility (R26.89);Pain;Hemiplegia and hemiparesis Symptoms and signs involving cognitive functions: Other cerebrovascular disease;Cerebral infarction Hemiplegia - Right/Left: Left Hemiplegia - dominant/non-dominant: Dominant Hemiplegia - caused by: Cerebral infarction (previous CVA) Pain - Right/Left: Left   Activity Tolerance Patient tolerated treatment well   Patient Left in chair;with call bell/phone within reach;with chair alarm set   Nurse Communication Mobility status;Need for lift equipment;Other (comment) (maximove back to bed)        Time: 1191-4782 OT Time Calculation (min): 32 min  Charges: OT General Charges $OT Visit: 1 Visit OT Treatments $Therapeutic Activity: 23-37 mins  Harley [place]., COTA/L Acute Rehabilitation Services [phone]    Precious Haws 12/17/2020, 2:02 PM

## 2020-12-18 DIAGNOSIS — J189 Pneumonia, unspecified organism: Secondary | ICD-10-CM

## 2020-12-18 DIAGNOSIS — J9600 Acute respiratory failure, unspecified whether with hypoxia or hypercapnia: Secondary | ICD-10-CM

## 2020-12-18 LAB — COMPREHENSIVE METABOLIC PANEL
ALT: 53 U/L — ABNORMAL HIGH (ref 0–44)
AST: 27 U/L (ref 15–41)
Albumin: 1.6 g/dL — ABNORMAL LOW (ref 3.5–5.0)
Alkaline Phosphatase: 87 U/L (ref 38–126)
Anion gap: 9 (ref 5–15)
BUN: 7 mg/dL — ABNORMAL LOW (ref 8–23)
CO2: 21 mmol/L — ABNORMAL LOW (ref 22–32)
Calcium: 7.8 mg/dL — ABNORMAL LOW (ref 8.9–10.3)
Chloride: 105 mmol/L (ref 98–111)
Creatinine, Ser: 0.7 mg/dL (ref 0.61–1.24)
GFR, Estimated: >60 mL/min (ref >60)
Glucose, Bld: 83 mg/dL (ref 70–99)
Potassium: 3.4 mmol/L — ABNORMAL LOW (ref 3.5–5.1)
Sodium: 135 mmol/L (ref 135–145)
Total Bilirubin: 0.8 mg/dL (ref 0.3–1.2)
Total Protein: 5.6 g/dL — ABNORMAL LOW (ref 6.5–8.1)

## 2020-12-18 LAB — CBC
HCT: 26.5 % — ABNORMAL LOW (ref 39.0–52.0)
Hemoglobin: 8.8 g/dL — ABNORMAL LOW (ref 13.0–17.0)
MCH: 32.1 pg (ref 26.0–34.0)
MCHC: 33.2 g/dL (ref 30.0–36.0)
MCV: 96.7 fL (ref 80.0–100.0)
Platelets: 220 10*3/uL (ref 150–400)
RBC: 2.74 MIL/uL — ABNORMAL LOW (ref 4.22–5.81)
RDW: 16 % — ABNORMAL HIGH (ref 11.5–15.5)
WBC: 14 10*3/uL — ABNORMAL HIGH (ref 4.0–10.5)
nRBC: 0.2 % (ref 0.0–0.2)

## 2020-12-18 LAB — MAGNESIUM: Magnesium: 1.5 mg/dL — ABNORMAL LOW (ref 1.7–2.4)

## 2020-12-18 LAB — GLUCOSE, CAPILLARY
Glucose-Capillary: 94 mg/dL (ref 70–99)
Glucose-Capillary: 147 mg/dL — ABNORMAL HIGH (ref 70–99)
Glucose-Capillary: 159 mg/dL — ABNORMAL HIGH (ref 70–99)

## 2020-12-18 LAB — BRAIN NATRIURETIC PEPTIDE: B Natriuretic Peptide: 1057.1 pg/mL — ABNORMAL HIGH (ref 0.0–100.0)

## 2020-12-18 MED ORDER — RESOURCE THICKENUP CLEAR PO POWD: ORAL | Status: AC

## 2020-12-18 MED ORDER — INSULIN ASPART 100 UNIT/ML ~~LOC~~ SOLN: 0.0000 [IU] | Freq: Three times a day (TID) | SUBCUTANEOUS | 11 refills | Status: DC

## 2020-12-18 MED ORDER — FUROSEMIDE 40 MG PO TABS
20.0000 mg | ORAL_TABLET | Freq: Every day | ORAL | Status: AC
Start: 2020-12-18 — End: ?

## 2020-12-18 MED ORDER — METOPROLOL TARTRATE 50 MG PO TABS: 50.0000 mg | ORAL_TABLET | Freq: Two times a day (BID) | ORAL | Status: DC

## 2020-12-18 MED ORDER — ACETAMINOPHEN 325 MG PO TABS: 650.0000 mg | ORAL_TABLET | Freq: Four times a day (QID) | ORAL | 2 refills | Status: AC | PRN

## 2020-12-18 MED ORDER — TRAMADOL HCL 50 MG PO TABS: 50.0000 mg | ORAL_TABLET | Freq: Four times a day (QID) | ORAL | 0 refills | Status: DC | PRN

## 2020-12-18 MED ORDER — SPIRONOLACTONE 25 MG PO TABS: 25.0000 mg | ORAL_TABLET | Freq: Every day | ORAL | Status: DC

## 2020-12-18 MED ORDER — POTASSIUM CHLORIDE CRYS ER 20 MEQ PO TBCR
40.0000 meq | EXTENDED_RELEASE_TABLET | Freq: Once | ORAL | Status: AC
  Administered 2020-12-18: 40 meq via ORAL
  Filled 2020-12-18: qty 2

## 2020-12-18 NOTE — Plan of Care (Signed)
  Problem: Clinical Measurements: Goal: Will remain free from infection Outcome: Progressing Goal: Respiratory complications will improve Outcome: Progressing Goal: Cardiovascular complication will be avoided Outcome: Progressing   Problem: Nutrition: Goal: Adequate nutrition will be maintained Outcome: Progressing   Problem: Coping: Goal: Level of anxiety will decrease Outcome: Progressing   Problem: Safety: Goal: Ability to remain free from injury will improve Outcome: Progressing   Problem: Skin Integrity: Goal: Risk for impaired skin integrity will decrease Outcome: Progressing

## 2020-12-18 NOTE — Care Management Important Message (Signed)
Important Message  Patient Details  Name: [name] MRN: [id number] Date of Birth: [birth year]   Medicare Important Message Given:  Yes - Important Message mailed due to current National Emergency   Verbal consent obtained due to current National Emergency  Relationship to patient: Self Contact Name: Jeramey Call Date: 12/18/20  Time: 1311 Phone: [phone] Outcome: Spoke with contact Important Message mailed to: Patient address on file    [name] 12/18/2020, 1:11 PM
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
## 2020-12-18 NOTE — TOC Transition Note (Signed)
Transition of Care [facility]) - CM/SW Discharge Note   Patient Details  Name: [name] MRN: [id number] Date of Birth: 1951/08/18  Transition of Care [facility]) CM/SW Contact:  Benard Halsted, [place] Phone Number: 12/18/2020, 4:41 PM   Clinical Narrative:    Patient will DC to: [facility] Anticipated DC date: 12/18/20 Family notified: Spouse, [name] Transport by: Corey Harold   Per MD patient ready for DC to [facility]. RN to call report prior to discharge [phone]). RN, patient, patient's family, and facility notified of DC. Discharge Summary and FL2 sent to facility. DC packet on chart. Ambulance transport requested for patient.   CSW will sign off for now as social work intervention is no longer needed. Please consult [place] again if new needs arise.        Barriers to Discharge: Barriers Resolved   Patient Goals and CMS Choice Patient states their goals for this hospitalization and ongoing recovery are:: to go to SNF CMS Medicare.gov Compare Post Acute Care list provided to:: Patient Represenative (must comment) (Spouse [name]) Choice offered to / list presented to : Spouse [name])  Discharge Placement   Existing PASRR number confirmed : 12/18/20          Patient chooses bed at: [facility] Patient to be transferred to facility by: [place] Name of family member notified: Spouse Patient and family notified of of transfer: 12/18/20  Discharge Plan and Services In-house Referral: Clinical Social Work                                   Social Determinants of Health ([place]) Interventions     Readmission Risk Interventions No flowsheet data found.

## 2020-12-24 ENCOUNTER — Telehealth: Payer: Self-pay | Admitting: Cardiology

## 2020-12-24 NOTE — Telephone Encounter (Signed)
Wife of patient wanted to know if the patient would be able to do a telemedicine visit. The patient is in a Nursing Home doing rehab from his amputation. Please let the Wife know what the office recommends

## 2020-12-24 NOTE — Telephone Encounter (Signed)
Left a message for the patient's wife to call back, per dpr. The patient does not currently have an appointment.

## 2020-12-26 ENCOUNTER — Ambulatory Visit: Payer: Medicare Other | Admitting: General Practice

## 2021-01-03 NOTE — Telephone Encounter (Signed)
Left message for patients wife, okay for virtual visit, to call to schedule.

## 2021-01-07 ENCOUNTER — Ambulatory Visit (INDEPENDENT_AMBULATORY_CARE_PROVIDER_SITE_OTHER): Payer: Medicare Other | Admitting: Physician Assistant

## 2021-01-07 ENCOUNTER — Other Ambulatory Visit: Payer: Self-pay

## 2021-01-07 VITALS — BP 91/63 | HR 100 | Temp 98.7°F | Resp 20

## 2021-01-07 DIAGNOSIS — I739 Peripheral vascular disease, unspecified: Secondary | ICD-10-CM

## 2021-01-07 MED ORDER — CEPHALEXIN 500 MG PO CAPS: 500.0000 mg | ORAL_CAPSULE | Freq: Three times a day (TID) | ORAL | 0 refills | Status: DC

## 2021-01-07 NOTE — Progress Notes (Addendum)
    Postoperative Visit    History of Present Illness   [name] is a 70 y.o. male who presents for postoperative follow-up for: left above-the-knee ampuation by Dr. Stanford Breed (Date: 11/11/20).  He is currently a resident at the [facility].  He was brought to the office on a stretcher by EMS today and no family member or nursing facility member present during examination.  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  Based on chart review he presented to the emergency department in [facility] yesterday after sustaining a fall on his left AKA.  He does not appear to be in pain.  CT performed at the emergency department demonstrated a 8 x 6 cm hematoma.  He has continued bleeding from this area on exam.  There are 2 areas of interrupted skin from the fall that appear to have been sutured shut.  For VQI Use Only   PRE-ADM LIVING: Nursing home  AMB STATUS: Wheelchair   Physical Examination   Vitals:   01/07/21 1307  BP: 91/63  Pulse: 100  Resp: 20  Temp: 98.7 F (37.1 C)  SpO2: 95%    LLE: Midportion of incision with 2 sutured areas of skin with continued oozing    Medical Decision Making    Procedure Note: Mid AKA incision was prepped and draped in standard fashion.  Sutures were removed from 2 areas of interrupted skin sustained from the fall.  Organized hematoma was expressed with manipulation of stump.  2 small wounds communicated with only skin layer separating them.  Skin bridge was anesthetized with 4 cc of 1% lidocaine.  A 15 blade scalpel was then used to connect two wounds.  A large amount of organized hematoma was then freed from the incision.  Through this incision I was able to palpate a layer of tissue overlying the distal end of the femur suggesting that no bone was exposed.  Large hematoma cavity was then packed with Kerlix.  Stump was reinforced with ABD Kerlix and Ace bandage.  Patient tolerated procedure well.  [name] is a 70 y.o. male  who presents after sustaining a fall onto previous healed left above-the-knee amputation   Large amount of organized hematoma was was removed from left AKA mid incision and cavity was packed with Kerlix  Patient should have dressing changes daily and as needed involving packing with Kerlix and reinforcement with ABD Kerlix and Ace wrap  A 2-week Keflex prescription will be sent to his pharmacy  Patient will follow up in 2 to 3 weeks for incision check  Dagoberto Ligas PA-C [facility] Office: [facility] MD: Stanford Breed

## 2021-01-08 ENCOUNTER — Telehealth: Payer: Self-pay

## 2021-01-08 ENCOUNTER — Other Ambulatory Visit: Payer: Self-pay

## 2021-01-08 DIAGNOSIS — I739 Peripheral vascular disease, unspecified: Secondary | ICD-10-CM

## 2021-01-08 NOTE — Telephone Encounter (Signed)
Nurse called to report dry gangrene on right fifth toe. Patient seen in clinic yesterday for stump wound - unaware of toe wound. Per nurse, toe is "black and dead." It has not spread to any other areas, and is dry. Added ABI to patient's follow up appt. Advised nurse to call back sooner if other toes became affected.

## 2021-01-10 ENCOUNTER — Other Ambulatory Visit: Payer: Self-pay

## 2021-01-10 ENCOUNTER — Encounter (HOSPITAL_COMMUNITY): Payer: Self-pay | Admitting: Family Medicine

## 2021-01-10 ENCOUNTER — Inpatient Hospital Stay (HOSPITAL_COMMUNITY)
Admission: EM | Admit: 2021-01-10 | Discharge: 2021-01-17 | DRG: 565 | Disposition: A | Discharge status: Skilled Nursing Facility | Payer: Medicare Other | Attending: Internal Medicine | Admitting: Internal Medicine

## 2021-01-10 DIAGNOSIS — Z811 Family history of alcohol abuse and dependence: Secondary | ICD-10-CM

## 2021-01-10 DIAGNOSIS — N400 Enlarged prostate without lower urinary tract symptoms: Secondary | ICD-10-CM | POA: Diagnosis present

## 2021-01-10 DIAGNOSIS — Z885 Allergy status to narcotic agent status: Secondary | ICD-10-CM

## 2021-01-10 DIAGNOSIS — F17210 Nicotine dependence, cigarettes, uncomplicated: Secondary | ICD-10-CM | POA: Diagnosis present

## 2021-01-10 DIAGNOSIS — Z8249 Family history of ischemic heart disease and other diseases of the circulatory system: Secondary | ICD-10-CM

## 2021-01-10 DIAGNOSIS — T8781 Dehiscence of amputation stump: Principal | ICD-10-CM | POA: Diagnosis present

## 2021-01-10 DIAGNOSIS — I252 Old myocardial infarction: Secondary | ICD-10-CM

## 2021-01-10 DIAGNOSIS — E1159 Type 2 diabetes mellitus with other circulatory complications: Secondary | ICD-10-CM | POA: Diagnosis present

## 2021-01-10 DIAGNOSIS — G894 Chronic pain syndrome: Secondary | ICD-10-CM | POA: Diagnosis present

## 2021-01-10 DIAGNOSIS — E871 Hypo-osmolality and hyponatremia: Secondary | ICD-10-CM | POA: Diagnosis not present

## 2021-01-10 DIAGNOSIS — T8131XA Disruption of external operation (surgical) wound, not elsewhere classified, initial encounter: Secondary | ICD-10-CM

## 2021-01-10 DIAGNOSIS — Z7901 Long term (current) use of anticoagulants: Secondary | ICD-10-CM

## 2021-01-10 DIAGNOSIS — E1165 Type 2 diabetes mellitus with hyperglycemia: Secondary | ICD-10-CM | POA: Diagnosis not present

## 2021-01-10 DIAGNOSIS — Z833 Family history of diabetes mellitus: Secondary | ICD-10-CM

## 2021-01-10 DIAGNOSIS — I959 Hypotension, unspecified: Secondary | ICD-10-CM | POA: Diagnosis not present

## 2021-01-10 DIAGNOSIS — M549 Dorsalgia, unspecified: Secondary | ICD-10-CM | POA: Diagnosis present

## 2021-01-10 DIAGNOSIS — I110 Hypertensive heart disease with heart failure: Secondary | ICD-10-CM | POA: Diagnosis present

## 2021-01-10 DIAGNOSIS — Z951 Presence of aortocoronary bypass graft: Secondary | ICD-10-CM

## 2021-01-10 DIAGNOSIS — I5022 Chronic systolic (congestive) heart failure: Secondary | ICD-10-CM | POA: Diagnosis present

## 2021-01-10 DIAGNOSIS — Z20822 Contact with and (suspected) exposure to covid-19: Secondary | ICD-10-CM | POA: Diagnosis present

## 2021-01-10 DIAGNOSIS — Z515 Encounter for palliative care: Secondary | ICD-10-CM | POA: Diagnosis not present

## 2021-01-10 DIAGNOSIS — E1152 Type 2 diabetes mellitus with diabetic peripheral angiopathy with gangrene: Secondary | ICD-10-CM | POA: Diagnosis not present

## 2021-01-10 DIAGNOSIS — G4733 Obstructive sleep apnea (adult) (pediatric): Secondary | ICD-10-CM | POA: Diagnosis not present

## 2021-01-10 DIAGNOSIS — I255 Ischemic cardiomyopathy: Secondary | ICD-10-CM | POA: Diagnosis present

## 2021-01-10 DIAGNOSIS — Z794 Long term (current) use of insulin: Secondary | ICD-10-CM

## 2021-01-10 DIAGNOSIS — Z7189 Other specified counseling: Secondary | ICD-10-CM

## 2021-01-10 DIAGNOSIS — I482 Chronic atrial fibrillation, unspecified: Secondary | ICD-10-CM | POA: Diagnosis not present

## 2021-01-10 DIAGNOSIS — E782 Mixed hyperlipidemia: Secondary | ICD-10-CM | POA: Diagnosis present

## 2021-01-10 DIAGNOSIS — F0390 Unspecified dementia without behavioral disturbance: Secondary | ICD-10-CM | POA: Diagnosis present

## 2021-01-10 DIAGNOSIS — I4892 Unspecified atrial flutter: Secondary | ICD-10-CM

## 2021-01-10 DIAGNOSIS — I2510 Atherosclerotic heart disease of native coronary artery without angina pectoris: Secondary | ICD-10-CM | POA: Diagnosis not present

## 2021-01-10 DIAGNOSIS — Z79899 Other long term (current) drug therapy: Secondary | ICD-10-CM | POA: Diagnosis not present

## 2021-01-10 DIAGNOSIS — Z881 Allergy status to other antibiotic agents status: Secondary | ICD-10-CM

## 2021-01-10 DIAGNOSIS — T148XXA Other injury of unspecified body region, initial encounter: Secondary | ICD-10-CM

## 2021-01-10 DIAGNOSIS — I4821 Permanent atrial fibrillation: Secondary | ICD-10-CM | POA: Diagnosis present

## 2021-01-10 DIAGNOSIS — K219 Gastro-esophageal reflux disease without esophagitis: Secondary | ICD-10-CM | POA: Diagnosis present

## 2021-01-10 DIAGNOSIS — Z8673 Personal history of transient ischemic attack (TIA), and cerebral infarction without residual deficits: Secondary | ICD-10-CM

## 2021-01-10 DIAGNOSIS — Z83438 Family history of other disorder of lipoprotein metabolism and other lipidemia: Secondary | ICD-10-CM | POA: Diagnosis not present

## 2021-01-10 DIAGNOSIS — Z888 Allergy status to other drugs, medicaments and biological substances status: Secondary | ICD-10-CM

## 2021-01-10 LAB — CBG MONITORING, ED: Glucose-Capillary: 314 mg/dL — ABNORMAL HIGH (ref 70–99)

## 2021-01-10 LAB — BASIC METABOLIC PANEL
Anion gap: 6 (ref 5–15)
BUN: 24 mg/dL — ABNORMAL HIGH (ref 8–23)
CO2: 24 mmol/L (ref 22–32)
Calcium: 8.2 mg/dL — ABNORMAL LOW (ref 8.9–10.3)
Chloride: 103 mmol/L (ref 98–111)
Creatinine, Ser: 0.87 mg/dL (ref 0.61–1.24)
GFR, Estimated: >60 mL/min (ref >60)
Glucose, Bld: 219 mg/dL — ABNORMAL HIGH (ref 70–99)
Potassium: 4.2 mmol/L (ref 3.5–5.1)
Sodium: 133 mmol/L — ABNORMAL LOW (ref 135–145)

## 2021-01-10 LAB — CBC WITH DIFFERENTIAL/PLATELET
Abs Immature Granulocytes: 0.08 10*3/uL — ABNORMAL HIGH (ref 0.00–0.07)
Basophils Absolute: 0.1 10*3/uL (ref 0.0–0.1)
Basophils Relative: 1 %
Eosinophils Absolute: 0.2 10*3/uL (ref 0.0–0.5)
Eosinophils Relative: 2 %
HCT: 28.6 % — ABNORMAL LOW (ref 39.0–52.0)
Hemoglobin: 9 g/dL — ABNORMAL LOW (ref 13.0–17.0)
Immature Granulocytes: 1 %
Lymphocytes Relative: 20 %
Lymphs Abs: 2.5 10*3/uL (ref 0.7–4.0)
MCH: 31.7 pg (ref 26.0–34.0)
MCHC: 31.5 g/dL (ref 30.0–36.0)
MCV: 100.7 fL — ABNORMAL HIGH (ref 80.0–100.0)
Monocytes Absolute: 1.2 10*3/uL — ABNORMAL HIGH (ref 0.1–1.0)
Monocytes Relative: 9 %
Neutro Abs: 8.5 10*3/uL — ABNORMAL HIGH (ref 1.7–7.7)
Neutrophils Relative %: 67 %
Platelets: 285 10*3/uL (ref 150–400)
RBC: 2.84 MIL/uL — ABNORMAL LOW (ref 4.22–5.81)
RDW: 14.6 % (ref 11.5–15.5)
WBC: 12.5 10*3/uL — ABNORMAL HIGH (ref 4.0–10.5)
nRBC: 0 % (ref 0.0–0.2)

## 2021-01-10 LAB — PROTIME-INR
INR: 1.5 — ABNORMAL HIGH (ref 0.8–1.2)
Prothrombin Time: 17.7 seconds — ABNORMAL HIGH (ref 11.4–15.2)

## 2021-01-10 LAB — TYPE AND SCREEN
ABO/RH(D): B POS
Antibody Screen: NEGATIVE

## 2021-01-10 MED ORDER — MEMANTINE HCL 10 MG PO TABS
5.0000 mg | ORAL_TABLET | Freq: Two times a day (BID) | ORAL | Status: DC
  Administered 2021-01-10 – 2021-01-17 (×14): 5 mg via ORAL
  Filled 2021-01-10 (×15): qty 1

## 2021-01-10 MED ORDER — FINASTERIDE 5 MG PO TABS
5.0000 mg | ORAL_TABLET | Freq: Every day | ORAL | Status: DC
  Administered 2021-01-11 – 2021-01-17 (×7): 5 mg via ORAL
  Filled 2021-01-10 (×7): qty 1

## 2021-01-10 MED ORDER — ONDANSETRON HCL 4 MG/2ML IJ SOLN: 4.0000 mg | Freq: Four times a day (QID) | INTRAMUSCULAR | Status: DC | PRN

## 2021-01-10 MED ORDER — ACETAMINOPHEN 650 MG RE SUPP: 650.0000 mg | Freq: Four times a day (QID) | RECTAL | Status: DC | PRN

## 2021-01-10 MED ORDER — INSULIN ASPART 100 UNIT/ML ~~LOC~~ SOLN
0.0000 [IU] | Freq: Every day | SUBCUTANEOUS | Status: DC
  Administered 2021-01-10: 4 [IU] via SUBCUTANEOUS

## 2021-01-10 MED ORDER — DONEPEZIL HCL 5 MG PO TABS
5.0000 mg | ORAL_TABLET | Freq: Every day | ORAL | Status: DC
  Administered 2021-01-10 – 2021-01-16 (×7): 5 mg via ORAL
  Filled 2021-01-10 (×7): qty 1

## 2021-01-10 MED ORDER — PANTOPRAZOLE SODIUM 40 MG PO TBEC
40.0000 mg | DELAYED_RELEASE_TABLET | Freq: Every day | ORAL | Status: DC
  Administered 2021-01-11 – 2021-01-17 (×7): 40 mg via ORAL
  Filled 2021-01-10 (×7): qty 1

## 2021-01-10 MED ORDER — INSULIN ASPART 100 UNIT/ML ~~LOC~~ SOLN
0.0000 [IU] | Freq: Three times a day (TID) | SUBCUTANEOUS | Status: DC
  Administered 2021-01-11: 2 [IU] via SUBCUTANEOUS
  Administered 2021-01-11 – 2021-01-12 (×3): 1 [IU] via SUBCUTANEOUS
  Administered 2021-01-12: 2 [IU] via SUBCUTANEOUS
  Administered 2021-01-13: 1 [IU] via SUBCUTANEOUS
  Administered 2021-01-13: 2 [IU] via SUBCUTANEOUS
  Administered 2021-01-13 – 2021-01-16 (×4): 1 [IU] via SUBCUTANEOUS
  Administered 2021-01-17: 2 [IU] via SUBCUTANEOUS

## 2021-01-10 MED ORDER — ATORVASTATIN CALCIUM 40 MG PO TABS
40.0000 mg | ORAL_TABLET | Freq: Every day | ORAL | Status: DC
  Administered 2021-01-10 – 2021-01-16 (×7): 40 mg via ORAL
  Filled 2021-01-10 (×7): qty 1

## 2021-01-10 MED ORDER — SENNOSIDES-DOCUSATE SODIUM 8.6-50 MG PO TABS: 1.0000 | ORAL_TABLET | Freq: Every evening | ORAL | Status: DC | PRN

## 2021-01-10 MED ORDER — FENTANYL CITRATE (PF) 100 MCG/2ML IJ SOLN
50.0000 ug | Freq: Once | INTRAMUSCULAR | Status: AC
  Administered 2021-01-10: 50 ug via INTRAVENOUS
  Filled 2021-01-10: qty 2

## 2021-01-10 MED ORDER — AMIODARONE HCL 200 MG PO TABS
200.0000 mg | ORAL_TABLET | Freq: Every day | ORAL | Status: DC
  Administered 2021-01-11 – 2021-01-17 (×7): 200 mg via ORAL
  Filled 2021-01-10 (×7): qty 1

## 2021-01-10 MED ORDER — ACETAMINOPHEN 325 MG PO TABS
650.0000 mg | ORAL_TABLET | Freq: Four times a day (QID) | ORAL | Status: DC | PRN
  Administered 2021-01-11 – 2021-01-15 (×4): 650 mg via ORAL
  Filled 2021-01-10 (×4): qty 2

## 2021-01-10 MED ORDER — TRAMADOL HCL 50 MG PO TABS
50.0000 mg | ORAL_TABLET | Freq: Four times a day (QID) | ORAL | Status: DC | PRN
  Administered 2021-01-10 – 2021-01-17 (×11): 50 mg via ORAL
  Filled 2021-01-10 (×11): qty 1

## 2021-01-10 MED ORDER — CEPHALEXIN 500 MG PO CAPS
500.0000 mg | ORAL_CAPSULE | Freq: Three times a day (TID) | ORAL | Status: DC
  Administered 2021-01-10 – 2021-01-12 (×6): 500 mg via ORAL
  Filled 2021-01-10 (×5): qty 1
  Filled 2021-01-10: qty 2

## 2021-01-10 MED ORDER — ONDANSETRON HCL 4 MG PO TABS: 4.0000 mg | ORAL_TABLET | Freq: Four times a day (QID) | ORAL | Status: DC | PRN

## 2021-01-10 NOTE — ED Triage Notes (Signed)
Pt arrives via EMS from the [facility] with complaints of surgical wound that continues to ooze and not heal. Pt had left BK amputation about 2 weeks ago. Packed several times with no improvement. EMS reports BP 81/50 and gave 700NS with increase to 122/72.  HR 104 CBG 271 97 RA  20RAC
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
## 2021-01-10 NOTE — ED Notes (Signed)
Patient soiled with urine. Patient cleansed and linen changed. Wife at bedside.

## 2021-01-11 LAB — COMPREHENSIVE METABOLIC PANEL
ALT: 13 U/L (ref 0–44)
AST: 15 U/L (ref 15–41)
Albumin: 2.2 g/dL — ABNORMAL LOW (ref 3.5–5.0)
Alkaline Phosphatase: 79 U/L (ref 38–126)
Anion gap: 6 (ref 5–15)
BUN: 18 mg/dL (ref 8–23)
CO2: 25 mmol/L (ref 22–32)
Calcium: 8.2 mg/dL — ABNORMAL LOW (ref 8.9–10.3)
Chloride: 103 mmol/L (ref 98–111)
Creatinine, Ser: 1.02 mg/dL (ref 0.61–1.24)
GFR, Estimated: >60 mL/min (ref >60)
Glucose, Bld: 215 mg/dL — ABNORMAL HIGH (ref 70–99)
Potassium: 4.8 mmol/L (ref 3.5–5.1)
Sodium: 134 mmol/L — ABNORMAL LOW (ref 135–145)
Total Bilirubin: 0.4 mg/dL (ref 0.3–1.2)
Total Protein: 6.2 g/dL — ABNORMAL LOW (ref 6.5–8.1)

## 2021-01-11 LAB — BASIC METABOLIC PANEL
Anion gap: 7 (ref 5–15)
BUN: 22 mg/dL (ref 8–23)
CO2: 23 mmol/L (ref 22–32)
Calcium: 8.2 mg/dL — ABNORMAL LOW (ref 8.9–10.3)
Chloride: 105 mmol/L (ref 98–111)
Creatinine, Ser: 0.87 mg/dL (ref 0.61–1.24)
GFR, Estimated: >60 mL/min (ref >60)
Glucose, Bld: 122 mg/dL — ABNORMAL HIGH (ref 70–99)
Potassium: 4.6 mmol/L (ref 3.5–5.1)
Sodium: 135 mmol/L (ref 135–145)

## 2021-01-11 LAB — CBC
HCT: 26.1 % — ABNORMAL LOW (ref 39.0–52.0)
HCT: 27.1 % — ABNORMAL LOW (ref 39.0–52.0)
Hemoglobin: 8.2 g/dL — ABNORMAL LOW (ref 13.0–17.0)
Hemoglobin: 8.4 g/dL — ABNORMAL LOW (ref 13.0–17.0)
MCH: 30.9 pg (ref 26.0–34.0)
MCH: 31 pg (ref 26.0–34.0)
MCHC: 31.4 g/dL (ref 30.0–36.0)
MCHC: 31 g/dL (ref 30.0–36.0)
MCV: 98.5 fL (ref 80.0–100.0)
MCV: 100 fL (ref 80.0–100.0)
Platelets: 260 10*3/uL (ref 150–400)
Platelets: 265 10*3/uL (ref 150–400)
RBC: 2.65 MIL/uL — ABNORMAL LOW (ref 4.22–5.81)
RBC: 2.71 MIL/uL — ABNORMAL LOW (ref 4.22–5.81)
RDW: 14.6 % (ref 11.5–15.5)
RDW: 14.8 % (ref 11.5–15.5)
WBC: 10.6 10*3/uL — ABNORMAL HIGH (ref 4.0–10.5)
WBC: 12.1 10*3/uL — ABNORMAL HIGH (ref 4.0–10.5)
nRBC: 0 % (ref 0.0–0.2)
nRBC: 0 % (ref 0.0–0.2)

## 2021-01-11 LAB — GLUCOSE, CAPILLARY
Glucose-Capillary: 121 mg/dL — ABNORMAL HIGH (ref 70–99)
Glucose-Capillary: 103 mg/dL — ABNORMAL HIGH (ref 70–99)
Glucose-Capillary: 198 mg/dL — ABNORMAL HIGH (ref 70–99)
Glucose-Capillary: 243 mg/dL — ABNORMAL HIGH (ref 70–99)

## 2021-01-11 MED ORDER — RESOURCE THICKENUP CLEAR PO POWD
ORAL | Status: DC | PRN
  Filled 2021-01-11: qty 125

## 2021-01-11 NOTE — Progress Notes (Signed)
[facility]  Subjective  -no acute events overnight.  States he had a little bit of oozing from the dressing.   Objective 104/76 83 97.7 F (36.5 C) (Oral) 18 98% No intake or output data in the 24 hours ending 01/11/21 1017  Left AKA with open wound with no overt hematoma appreciated this morning  Laboratory Lab Results: Recent Labs    01/10/21 1640 01/11/21 0211  WBC 12.5* 10.6*  HGB 9.0* 8.2*  HCT 28.6* 26.1*  PLT 285 260   BMET Recent Labs    01/10/21 1640 01/11/21 0211  NA 133* 135  K 4.2 4.6  CL 103 105  CO2 24 23  GLUCOSE 219* 122*  BUN 24* 22  CREATININE 0.87 0.87  CALCIUM 8.2* 8.2*    COAG Lab Results  Component Value Date   INR 1.5 (H) 01/10/2021   INR 1.7 (H) 12/11/2020   INR 2.2 (H) 12/10/2020   No results found for: PTT  Assessment/Planning:  70 year old male that was seen in the ED last night for bleeding from his left AKA.  Reportedly had a mechanical fall recently after AKA in January.  Evacuated most of the hematoma last night at bedside and we rechange his dressing this morning.  There is no significant hematoma or oozing since his Eliquis has been held.  We will continue dressing changes with wet-to-dry dressing.  Hemoglobin 9.0 --> 8.2.  Hoping to avoid a washout on the OR if he continues to tolerate bedside dressing changes and no ongoing signs of bleeding.  Marty Heck 01/11/2021 10:17 AM --

## 2021-01-11 NOTE — Progress Notes (Signed)
RT asked patient if he wears CPAP at home. Patient stated he doesn't wear.  RN asked if he would wear the CPAP here tonight and patient refused at this time.  RT will continue to monitor.

## 2021-01-11 NOTE — ED Notes (Signed)
Report attempted x 2. Again stated RN would call back.

## 2021-01-11 NOTE — Progress Notes (Signed)
PROGRESS NOTE  [name] GLO:[id number] DOB: May 20, 1951 DOA: 01/10/2021 PCP: Caprice Renshaw, MD  HPI/Recap of past 24 hours: [name] is a 70 y.o. 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  Patient underwent a left AKA on 11/11/2020, was admitted to the hospital in the interim with septic shock secondary to pneumonia, was intubated initially, and discharged to SNF on 12/18/2020 on room air.  His wife is at the bedside and assist with the history.  They report bleeding from the left AKA wound for at least a couple weeks now.  There was recent notation of a fall onto the left AKA but patient and his wife deny this. Patient reports some pain at the stump but denies any fevers, cough, chest pain, or shortness of breath.  He reportedly had a blood pressure of 81/50 with EMS and was given 700 cc of saline prior to arrival in the ED.  ED Course: Upon arrival to the ED, patient is found to be afebrile, saturating mid 90s on room air, and with blood pressure 105/55.  Chemistry panel notable for a glucose of 219.  CBC with stable leukocytosis and anemia.  Type and screen was performed in the emergency department, patient was treated with fentanyl, and he was evaluated by vascular surgery with recommendation for medical admission.  Review of Systems:  All other systems reviewed and apart from HPI, are negative.  01/11/21: Seen and examined at his bedside.  Reports pain in his left stump.  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   OSA on CPAP    Type 2 diabetes mellitus with vascular disease (HCC)   Atrial fibrillation, chronic (HCC)  Bleeding from left AKA wound  - Presents with persistent bleeding from left AKA wound, seen by vascular surgery in ED, being considered for washout and wound vac, recommends holding Eliquis for now  - Type and screen, monitor H&H, hold Eliquis  -Hg 8.2 from 9.0 -Continue to monitor Pain control  Chronic systolic CHF  - Appears compensated  - EF was 20-25% one month ago  - Hold diuretics and beta-blocker initially considering hypotension with EMS, monitor weight and I/Os   Strict I&os and daily weight  CAD  - No anginal complaints  - Continue statin    Permanent Atrial fibrillation  - In rate-controlled a fib on admission  - Hold Eliquis as above, continue amiodarone, hold metoprolol initially in light of hypotension with EMS and resume if BP remains stable overnight    Insulin-dependent DM 2 - A1c was 6.9% in January 2022  - Continue CBG checks and insulin    Dementia  - Continue Namenda and Aricept  - Delirium precautions    DVT prophylaxis: SCD for now, Eliquis pta  Code Status: Full  Level of Care: Level of care: Telemetry Cardiac Family Communication: none at bedside Disposition Plan:  Patient is from: SNF  Anticipated d/c is to: SNF  Anticipated d/c date is: ~01/13/21 Patient currently: not safe for dc, ongoing management of left AKA wound. Consults called: Vascular surgery  Admission status: Inpatient  Status is: Inpatient    Dispo:  Patient From: Home  Planned Disposition: Home with [facility]  Medically stable for discharge: No          Objective: Vitals:   01/11/21 0111 01/11/21 0438 01/11/21 0715 01/11/21 1315  BP: (!) 102/47 102/82 104/76 108/60  Pulse: 71 66 83 (!) 53  Resp: 18 18 18 18   Temp: 98.6 F (37 C) 97.7 F (36.5 C) 97.7 F (36.5 C) 97.9 F (36.6 C)  TempSrc: Oral Oral Oral Oral  SpO2: 99% 96% 98% 98%  Weight: 87.2  kg     Height:       No intake or output data in the 24 hours ending 01/11/21 1439 Filed Weights   01/10/21 1639 01/11/21 0111  Weight: 112 kg 87.2 kg    Exam:  . General: 70 y.o. year-old male well developed well nourished in no acute distress.  Alert and oriented x3. . Cardiovascular: Irregular rate and rhythm with no rubs or gallops.  No thyromegaly or JVD noted.   Marland Kitchen Respiratory: Clear to auscultation with no wheezes or rales. Good inspiratory effort. . Abdomen: Soft nontender nondistended with normal bowel sounds x4 quadrants. . Musculoskeletal: No lower extremity edema.  . Skin: left AKA . Psychiatry: Mood is appropriate for condition and setting   Data Reviewed: CBC: Recent Labs  Lab 01/10/21 1640 01/11/21 0211  WBC 12.5* 10.6*  NEUTROABS 8.5*  --   HGB 9.0* 8.2*  HCT 28.6* 26.1*  MCV 100.7* 98.5  PLT 285 109   Basic Metabolic Panel: Recent Labs  Lab 01/10/21 1640 01/11/21 0211  NA 133* 135  K 4.2 4.6  CL 103 105  CO2 24 23  GLUCOSE 219* 122*  BUN 24* 22  CREATININE 0.87 0.87  CALCIUM 8.2* 8.2*   GFR: Estimated Creatinine Clearance: 85.6 mL/min (by C-G formula based on SCr of 0.87 mg/dL). Liver Function Tests: No results for input(s): AST, ALT, ALKPHOS, BILITOT, PROT, ALBUMIN in the last 168 hours. No results for input(s): LIPASE, AMYLASE in the last 168 hours. No results for input(s): AMMONIA in the last 168 hours. Coagulation Profile: Recent Labs  Lab 01/10/21 1640  INR 1.5*   Cardiac Enzymes: No results for input(s): CKTOTAL, CKMB, CKMBINDEX, TROPONINI in the last 168 hours. BNP (last 3 results) No results for input(s): PROBNP in the last 8760 hours. HbA1C: No results for input(s): HGBA1C in the last 72 hours. CBG: Recent Labs  Lab 01/10/21 2151 01/11/21 0850 01/11/21 1149  GLUCAP 314* 121* 103*   Lipid Profile: No results for input(s): CHOL, HDL, LDLCALC, TRIG, CHOLHDL, LDLDIRECT in the last 72 hours. Thyroid Function Tests: No  results for input(s): TSH, T4TOTAL, FREET4, T3FREE, THYROIDAB in the last 72 hours. Anemia Panel: No results for input(s): VITAMINB12, FOLATE, FERRITIN, TIBC, IRON, RETICCTPCT in the last 72 hours. Urine analysis:    Component Value Date/Time   COLORURINE YELLOW 12/09/2020 1350   APPEARANCEUR CLEAR 12/09/2020 1350   LABSPEC 1.019 12/09/2020 1350   PHURINE 5.0 12/09/2020 1350   GLUCOSEU NEGATIVE 12/09/2020 1350   HGBUR NEGATIVE 12/09/2020 1350   BILIRUBINUR NEGATIVE 12/09/2020 1350   KETONESUR NEGATIVE 12/09/2020 1350   PROTEINUR NEGATIVE 12/09/2020 1350   UROBILINOGEN 0.2 08/20/2014 2042   NITRITE NEGATIVE 12/09/2020 1350   LEUKOCYTESUR NEGATIVE 12/09/2020 1350   Sepsis Labs: @LABRCNTIP (procalcitonin:4,lacticidven:4)  )No results found for this or any previous visit (from the past 240 hour(s)).    Studies: No results found.  Scheduled Meds: . amiodarone  200 mg Oral Daily  . atorvastatin  40 mg Oral QHS  . cephALEXin  500 mg Oral TID  . donepezil  5 mg Oral QHS  . finasteride  5 mg Oral Q1200  . insulin aspart  0-5 Units Subcutaneous QHS  . insulin aspart  0-9 Units Subcutaneous TID WC  . memantine  5 mg Oral BID  . pantoprazole  40 mg Oral Daily    Continuous Infusions:   LOS: 1 day     Kayleen Memos, MD [facility] Pager [phone]  If 7PM-7AM, please contact night-coverage [email or web address] Password [facility] 01/11/2021, 2:39 PM

## 2021-01-11 NOTE — ED Notes (Signed)
RN attempted to call report told unaware pt was gong to room would call back for report.

## 2021-01-11 NOTE — ED Notes (Signed)
Assuming care. Pt cleaned after soiling self.  Pt resting in bed @ this time w/ NAD noted. VSS. Cardiac monitoring in place w/ bp cycling and spo2 being monitored. Updated on plan of care. Deny needs or concerns @ this time. Bed low and locked.

## 2021-01-11 NOTE — ED Notes (Signed)
Report given. Pt taken upstairs by Annie Main, PCT. NAD noted. VSS. Cardiac monitoring in place. Pt changed prior to going upstairs.

## 2021-01-12 LAB — BASIC METABOLIC PANEL
Anion gap: 7 (ref 5–15)
BUN: 15 mg/dL (ref 8–23)
CO2: 23 mmol/L (ref 22–32)
Calcium: 8.4 mg/dL — ABNORMAL LOW (ref 8.9–10.3)
Chloride: 105 mmol/L (ref 98–111)
Creatinine, Ser: 0.88 mg/dL (ref 0.61–1.24)
GFR, Estimated: >60 mL/min (ref >60)
Glucose, Bld: 179 mg/dL — ABNORMAL HIGH (ref 70–99)
Potassium: 4.4 mmol/L (ref 3.5–5.1)
Sodium: 135 mmol/L (ref 135–145)

## 2021-01-12 LAB — CBC
HCT: 26.8 % — ABNORMAL LOW (ref 39.0–52.0)
Hemoglobin: 8.6 g/dL — ABNORMAL LOW (ref 13.0–17.0)
MCH: 31.3 pg (ref 26.0–34.0)
MCHC: 32.1 g/dL (ref 30.0–36.0)
MCV: 97.5 fL (ref 80.0–100.0)
Platelets: 238 10*3/uL (ref 150–400)
RBC: 2.75 MIL/uL — ABNORMAL LOW (ref 4.22–5.81)
RDW: 14.6 % (ref 11.5–15.5)
WBC: 11.8 10*3/uL — ABNORMAL HIGH (ref 4.0–10.5)
nRBC: 0 % (ref 0.0–0.2)

## 2021-01-12 LAB — GLUCOSE, CAPILLARY
Glucose-Capillary: 122 mg/dL — ABNORMAL HIGH (ref 70–99)
Glucose-Capillary: 141 mg/dL — ABNORMAL HIGH (ref 70–99)
Glucose-Capillary: 149 mg/dL — ABNORMAL HIGH (ref 70–99)
Glucose-Capillary: 159 mg/dL — ABNORMAL HIGH (ref 70–99)
Glucose-Capillary: 174 mg/dL — ABNORMAL HIGH (ref 70–99)

## 2021-01-12 MED ORDER — DAKINS (1/4 STRENGTH) 0.125 % EX SOLN
Freq: Every day | CUTANEOUS | Status: AC
  Filled 2021-01-12: qty 473

## 2021-01-12 MED ORDER — FENTANYL CITRATE (PF) 100 MCG/2ML IJ SOLN
50.0000 ug | Freq: Once | INTRAMUSCULAR | Status: AC
  Administered 2021-01-12: 50 ug via INTRAVENOUS
  Filled 2021-01-12: qty 2

## 2021-01-12 MED ORDER — METOPROLOL TARTRATE 12.5 MG HALF TABLET
12.5000 mg | ORAL_TABLET | Freq: Two times a day (BID) | ORAL | Status: DC
  Administered 2021-01-12 – 2021-01-17 (×10): 12.5 mg via ORAL
  Filled 2021-01-12 (×10): qty 1

## 2021-01-12 MED ORDER — LEVOFLOXACIN IN D5W 750 MG/150ML IV SOLN
750.0000 mg | INTRAVENOUS | Status: DC
  Administered 2021-01-12 – 2021-01-13 (×2): 750 mg via INTRAVENOUS
  Filled 2021-01-12 (×3): qty 150

## 2021-01-12 NOTE — Progress Notes (Signed)
   01/12/21 1634  Assess: MEWS Score  Temp 98.1 F (36.7 C)  BP (!) 133/52  Pulse Rate (!) 109  ECG Heart Rate (!) 113  Resp 18  Level of Consciousness Alert  SpO2 98 %  O2 Device Room Air  Assess: MEWS Score  MEWS Temp 0  MEWS Systolic 0  MEWS Pulse 2  MEWS RR 0  MEWS LOC 0  MEWS Score 2  MEWS Score Color Yellow  Assess: if the MEWS score is Yellow or Red  Were vital signs taken at a resting state? Yes  Focused Assessment No change from prior assessment  Early Detection of Sepsis Score *See Row Information* Low  MEWS guidelines implemented *See Row Information* Yes  Treat  MEWS Interventions Escalated (See documentation below)  Take Vital Signs  Increase Vital Sign Frequency  Yellow: Q 2hr X 2 then Q 4hr X 2, if remains yellow, continue Q 4hrs  Escalate  MEWS: Escalate Yellow: discuss with charge nurse/RN and consider discussing with provider and RRT  Notify: Charge Nurse/RN  Name of Charge Nurse/RN Notified Lonny Prude, RN  Date Charge Nurse/RN Notified 01/12/21  Time Charge Nurse/RN Notified 1634  Document  Progress note created (see row info) Yes

## 2021-01-12 NOTE — Progress Notes (Signed)
[facility]  Subjective  -no further oozing from left AKA.   Objective (!) 145/68 (!) 120 98.1 F (36.7 C) (Oral) 18 99%  Intake/Output Summary (Last 24 hours) at 01/12/2021 1028 Last data filed at 01/12/2021 0700 Gross per 24 hour  Intake --  Output 850 ml  Net -850 ml    Left AKA with open wound with no overt hematoma appreciated this morning, some bluish discoloration on the dressing when the wound was changed in the left AKA  Laboratory Lab Results: Recent Labs    01/11/21 1909 01/12/21 0244  WBC 12.1* 11.8*  HGB 8.4* 8.6*  HCT 27.1* 26.8*  PLT 265 238   BMET Recent Labs    01/11/21 1909 01/12/21 0244  NA 134* 135  K 4.8 4.4  CL 103 105  CO2 25 23  GLUCOSE 215* 179*  BUN 18 15  CREATININE 1.02 0.88  CALCIUM 8.2* 8.4*    COAG Lab Results  Component Value Date   INR 1.5 (H) 01/10/2021   INR 1.7 (H) 12/11/2020   INR 2.2 (H) 12/10/2020   No results found for: PTT  Assessment/Planning:  70 year old male that was seen in the ED Friday night for bleeding from his left AKA.  Reportedly had a mechanical fall recently after AKA in January.  Evacuated most of the hematoma at bedside.  Since Eliquis held, no further bleeding, and no hematoma appreciated today.  I changed the dressing this morning and there is some bluish discoloration on the dressing concerning for Pseudomonas.  Have ordered Dakin's and will do a wet-to-dry Dakin's to left AKA wound. He asked about a new right fifth toe ulcer.  In review of the notes previously seen by Dr. Oneida Alar and offered AKA on the left given he was nonambulatory so likely not candidate for extensive revascularization of right leg given non-ambulatory with previous stroke.  Marty Heck 01/12/2021 10:28 AM --

## 2021-01-12 NOTE — Progress Notes (Signed)
PROGRESS NOTE  [name] KDX:[id number] DOB: [birth year] DOA: 01/10/2021 PCP: Caprice Renshaw, MD  HPI/Recap of past 24 hours: [name] is a 70 y.o. 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  Patient underwent a left AKA on 11/11/2020, was admitted to the hospital in the interim with septic shock secondary to pneumonia, was intubated initially, and discharged to SNF on 12/18/2020 on room air.  His wife is at the bedside and assist with the history.  They report bleeding from the left AKA wound for at least a couple weeks now.  There was recent notation of a fall onto the left AKA but patient and his wife deny this. Patient reports some pain at the stump but denies any fevers, cough, chest pain, or shortness of breath.  He reportedly had a blood pressure of 81/50 with EMS and was given 700 cc of saline prior to arrival in the ED.  ED Course: Upon arrival to the ED, patient is found to be afebrile, saturating mid 90s on room air, and with blood pressure 105/55.  Chemistry panel notable for a glucose of 219.  CBC with stable leukocytosis and anemia.  Type and screen was performed in the emergency department, patient was treated with fentanyl, and he was evaluated by vascular surgery with recommendation for medical admission.  Vascular surgery following.  Change of dressing on 01/12/2021 per vascular surgery there was some bluish discoloration on the dressing concerning for Pseudomonas.  Will obtain superficial wound culture and start Levaquin empirically.  01/12/21: Seen and examined at his bedside.  He reports having some pain in his left stump and on his fifth  right toe.  Evaluated by vascular surgery, likely not a candidate for extensive revascularization on the right leg given nonambulatory with previous stroke.  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 -Continue to monitor H&H  Suspected left AKA wound, possible Pseudomonas infection Change of dressing on 01/12/2021 per vascular surgery there was some bluish discoloration on the dressing concerning for Pseudomonas.  Will obtain superficial wound culture and start Levaquin empirically. Monitor fever curve and WBC Obtain blood cultures x2 peripherally  Permanent A. fib with RVR We will obtain a twelve-lead EKG Resume home p.o. Lopressor at lower dose to avoid beta-blockade withdrawal. Continue home amiodarone. Eliquis on hold due to recent hematoma from left AKA Continue to closely monitor on telemetry  Left fifth toe dry gangrene Evaluated by vascular surgery likely not a candidate for extensive revascularization on the right leg given nonambulatory with previous stroke. Management per vascular surgery.  Chronic systolic CHF  - Appears compensated  - EF was 20-25% one month ago  - Diuretics on hold -Continue strict I&os and daily weight -Net I&O -850 cc  CAD  -  No anginal complaints  - Continue statin    Insulin-dependent DM 2 with hyperglycemia. - A1c was 6.9% in January 2022  - Continue CBG checks and insulin    Dementia  - Continue Namenda and Aricept  - Delirium precautions    DVT prophylaxis: SCD for now, he was  on Eliquis prior to admission for permanent A. fib, on hold due to recent hematoma to left AKA. Code Status: Full  Level of Care: Level of care: Telemetry Cardiac Family Communication: none at bedside Disposition Plan:  Patient is from: SNF  Anticipated d/c is to: SNF  Anticipated d/c date is: ~01/14/21 Patient currently: not safe for dc, ongoing management of left AKA wound. Consults called: Vascular surgery  Admission status: Inpatient       Status is: Inpatient    Dispo:  Patient From: [place]  Planned Disposition: [place]  Medically stable for discharge: No          Objective: Vitals:   01/12/21 0342 01/12/21 1019 01/12/21 1125 01/12/21 1634  BP:  (!) 145/68 (!) 141/97 (!) 133/52  Pulse:  (!) 120 (!) 110 (!) 109  Resp:  18 18   Temp:  98.1 F (36.7 C) 98 F (36.7 C) 98.1 F (36.7 C)  TempSrc:  Oral Oral Oral  SpO2:  99% 97% 98%  Weight: 86.5 kg     Height:        Intake/Output Summary (Last 24 hours) at 01/12/2021 1645 Last data filed at 01/12/2021 0700 Gross per 24 hour  Intake --  Output 850 ml  Net -850 ml   Filed Weights   01/10/21 1639 01/11/21 0111 01/12/21 0342  Weight: 112 kg 87.2 kg 86.5 kg    Exam:  . General: 70 y.o. year-old male chronically ill-appearing no acute distress.  He is alert and interactive.   . Cardiovascular: Tachycardic irregular rate and rhythm with no rubs or gallops.  Marland Kitchen Respiratory: Clear to auscultation no wheezes or rales.   . Abdomen: Soft nontender no bowel sounds present.   . Musculoskeletal: Dry gangrene affecting the right fifth toe . Skin: left AKA wrapped with surgical dressing. Marland Kitchen Psychiatry: Mood is appropriate for condition and setting.   Data Reviewed: CBC: Recent Labs  Lab 01/10/21 1640 01/11/21 0211 01/11/21 1909 01/12/21 0244  WBC 12.5* 10.6* 12.1* 11.8*  NEUTROABS 8.5*  --   --   --   HGB 9.0* 8.2* 8.4* 8.6*  HCT 28.6* 26.1* 27.1* 26.8*  MCV 100.7* 98.5 100.0  97.5  PLT 285 260 265 160   Basic Metabolic Panel: Recent Labs  Lab 01/10/21 1640 01/11/21 0211 01/11/21 1909 01/12/21 0244  NA 133* 135 134* 135  K 4.2 4.6 4.8 4.4  CL 103 105 103 105  CO2 24 23 25 23   GLUCOSE 219* 122* 215* 179*  BUN 24* 22 18 15   CREATININE 0.87 0.87 1.02 0.88  CALCIUM 8.2* 8.2* 8.2* 8.4*   GFR: Estimated Creatinine Clearance: 84.4 mL/min (by C-G formula based on SCr of 0.88 mg/dL). Liver Function Tests: Recent Labs  Lab 01/11/21 1909  AST 15  ALT 13  ALKPHOS 79  BILITOT 0.4  PROT 6.2*  ALBUMIN 2.2*   No results for input(s): LIPASE, AMYLASE in the last 168 hours. No results for input(s): AMMONIA in the last 168 hours. Coagulation Profile: Recent Labs  Lab 01/10/21 1640  INR 1.5*   Cardiac Enzymes: No results for input(s): CKTOTAL, CKMB, CKMBINDEX, TROPONINI in the last 168 hours.  BNP (last 3 results) No results for input(s): PROBNP in the last 8760 hours. HbA1C: No results for input(s): HGBA1C in the last 72 hours. CBG: Recent Labs  Lab 01/11/21 2111 01/12/21 0033 01/12/21 0735 01/12/21 1123 01/12/21 1628  GLUCAP 243* 141* 149* 122* 159*   Lipid Profile: No results for input(s): CHOL, HDL, LDLCALC, TRIG, CHOLHDL, LDLDIRECT in the last 72 hours. Thyroid Function Tests: No results for input(s): TSH, T4TOTAL, FREET4, T3FREE, THYROIDAB in the last 72 hours. Anemia Panel: No results for input(s): VITAMINB12, FOLATE, FERRITIN, TIBC, IRON, RETICCTPCT in the last 72 hours. Urine analysis:    Component Value Date/Time   COLORURINE YELLOW 12/09/2020 1350   APPEARANCEUR CLEAR 12/09/2020 1350   LABSPEC 1.019 12/09/2020 1350   PHURINE 5.0 12/09/2020 1350   GLUCOSEU NEGATIVE 12/09/2020 1350   HGBUR NEGATIVE 12/09/2020 1350   BILIRUBINUR NEGATIVE 12/09/2020 1350   KETONESUR NEGATIVE 12/09/2020 1350   PROTEINUR NEGATIVE 12/09/2020 1350   UROBILINOGEN 0.2 08/20/2014 2042   NITRITE NEGATIVE 12/09/2020 1350   LEUKOCYTESUR NEGATIVE  12/09/2020 1350   Sepsis Labs: @LABRCNTIP (procalcitonin:4,lacticidven:4)  )No results found for this or any previous visit (from the past 240 hour(s)).    Studies: No results found.  Scheduled Meds: . amiodarone  200 mg Oral Daily  . atorvastatin  40 mg Oral QHS  . cephALEXin  500 mg Oral TID  . donepezil  5 mg Oral QHS  . finasteride  5 mg Oral Q1200  . insulin aspart  0-5 Units Subcutaneous QHS  . insulin aspart  0-9 Units Subcutaneous TID WC  . memantine  5 mg Oral BID  . pantoprazole  40 mg Oral Daily  . sodium hypochlorite   Irrigation Daily    Continuous Infusions:   LOS: 2 days     Kayleen Memos, MD [facility] Pager [phone]  If 7PM-7AM, please contact night-coverage [email or web address] Password [facility] 01/12/2021, 4:45 PM

## 2021-01-12 NOTE — Progress Notes (Signed)
Patient doesn't wear CPAP at home and stated he  will not wear at this time.

## 2021-01-12 NOTE — Evaluation (Signed)
Occupational Therapy Evaluation Patient Details Name: [name] MRN: [id number] DOB: 07/13/1951 Today's Date: 01/12/2021    History of Present Illness Pt is a 70 y.o. male who presented 4/1 with bleeding from his L AKA wound (s/p 11/11/20). Of note, pt was admitted to the hospital in the interim with septic shock secondary to pneumonia, was intubated initially, and discharged to SNF on 12/18/2020 on room air. Per chart, reportedly he had a mechanical fall recently after AKA in January. Pt being considered for washout and wound vac. PMH: a-fib, ischemic cardiomyopathy with EF 20-25%, DM, PAD, TBI, CVA (left hemiparesis, splint), SDH, peripheral neuropathy, pelvic fx, MI, HTN, humeral fx, CAD, and L AKA, COVID 11/19/2020   Clinical Impression   This 70 yo male admitted with above presents to acute OT with PLOF of being at SNF and needed A for all basic ADLs and mobility, but was able to A some. Due to his LUE hemiplegia and LLE AKA he needs increased A for all ADLs. He will continue to benefit from acute OT with follow up back at SNF.    Follow Up Recommendations  SNF;Supervision/Assistance - 24 hour    Equipment Recommendations  Other (comment) (TBD next venue)       Precautions / Restrictions Precautions Precautions: Fall Precaution Comments: L AKA Restrictions Weight Bearing Restrictions: No      Mobility Bed Mobility Overal bed mobility: Needs Assistance Bed Mobility: Supine to Sit;Sit to Supine     Supine to sit: Mod assist Sit to supine: Min guard assist   General bed mobility comments: ModA with pt pulling up on OT to raise up trunk and scoot hips to square with EOB.      Balance Overall balance assessment: Needs assistance Sitting-balance support: No upper extremity supported (RLE supported) Sitting balance-Leahy Scale: Fair Sitting balance - Comments: Static sitting EOB, min guard.                             ADL either performed or assessed with  clinical judgement   ADL Overall ADL's : Needs assistance/impaired Eating/Feeding: Sitting;Set up Eating/Feeding Details (indicate cue type and reason): EOB Grooming: Sitting;Minimal assistance Grooming Details (indicate cue type and reason): EOB Upper Body Bathing: Maximal assistance;Sitting Upper Body Bathing Details (indicate cue type and reason): EOB Lower Body Bathing: Total assistance;Bed level   Upper Body Dressing : Maximal assistance;Sitting Upper Body Dressing Details (indicate cue type and reason): EOB Lower Body Dressing: Total assistance;Bed level                       Vision Patient Visual Report: No change from baseline              Pertinent Vitals/Pain Pain Assessment: Faces Faces Pain Scale: Hurts little more Pain Location: L AKA Pain Descriptors / Indicators: Discomfort;Guarding;Sore Pain Intervention(s): Limited activity within patient's tolerance;Monitored during session;Repositioned     Hand Dominance Right   Extremity/Trunk Assessment Upper Extremity Assessment Upper Extremity Assessment: LUE deficits/detail LUE Deficits / Details: h/o CVA; pt with flexion synergy pattern. Resting hand splint in room--I was unable to get this donned to do increased flexion tone at wrist and digits LUE Coordination: decreased fine motor;decreased gross motor     Communication Communication Communication:  (soft voice, difficult to understand at times)   Cognition Arousal/Alertness: Awake/alert Behavior During Therapy: WFL for tasks assessed/performed Overall Cognitive Status: No family/caregiver present to determine baseline cognitive functioning  Home Living Family/patient expects to be discharged to:: Skilled nursing facility                                 Additional Comments: Pt reports he has not yet stood up since his L AKA surgery, but has been performing transfers via  squat pivot or using the sliding board. Pt reports he sits in a w/c that his wife pushes for him for mobility since his AKA.      Prior Functioning/Environment Level of Independence: Needs assistance  Gait / Transfers Assistance Needed: Since AKA, pt has needed physical assistance for squat pivot or sliding board transfers between surfaces and is dependent on his wife to push him in a w/c. ADL's / Homemaking Assistance Needed: assist for all ADLS from staff            OT Problem List: Decreased strength;Impaired balance (sitting and/or standing);Pain      OT Treatment/Interventions: Self-care/ADL training;DME and/or AE instruction;Patient/family education;Balance training    OT Goals(Current goals can be found in the care plan section) Acute Rehab OT Goals Patient Stated Goal: to go back to rehab OT Goal Formulation: With patient Time For Goal Achievement: 01/26/21 Potential to Achieve Goals: Good  OT Frequency: Min 2X/week              AM-PAC OT "6 Clicks" Daily Activity     Outcome Measure Help from another person eating meals?: A Little (setup) Help from another person taking care of personal grooming?: A Lot Help from another person toileting, which includes using toliet, bedpan, or urinal?: Total Help from another person bathing (including washing, rinsing, drying)?: A Lot Help from another person to put on and taking off regular upper body clothing?: A Lot Help from another person to put on and taking off regular lower body clothing?: Total 6 Click Score: 11   End of Session    Activity Tolerance: Patient tolerated treatment well Patient left: in bed;with call bell/phone within reach;with bed alarm set  OT Visit Diagnosis: Unsteadiness on feet (R26.81);Other abnormalities of gait and mobility (R26.89);Muscle weakness (generalized) (M62.81);Pain Pain - Right/Left: Left Pain - part of body: Leg                Time: 1749-4496 OT Time Calculation (min): 31  min Charges:  OT General Charges $OT Visit: 1 Visit OT Evaluation $OT Eval Moderate Complexity: 1 Mod OT Treatments $Self Care/Home Management : 8-22 mins Golden Circle, OTR/L Acute [employer] Pager [phone] Office [phone]     Almon Register 01/12/2021, 2:23 PM

## 2021-01-12 NOTE — Evaluation (Signed)
Physical Therapy Evaluation Patient Details Name: [name] MRN: [id number] DOB: 02-27-1951 Today's Date: 01/12/2021   History of Present Illness  Pt is a 70 y.o. male who presented 4/1 with bleeding from his L AKA wound (s/p 11/11/20). Of note, pt was admitted to the hospital in the interim with septic shock secondary to pneumonia, was intubated initially, and discharged to SNF on 12/18/2020 on room air. Per chart, reportedly he had a mechanical fall recently after AKA in January. Pt being considered for washout and wound vac. PMH: a-fib, ischemic cardiomyopathy with EF 20-25%, DM, PAD, TBI, CVA, SDH, peripheral neuropathy, pelvic fx, MI, HTN, humeral fx, CAD, and L AKA.    Clinical Impression  Pt presents with condition above and deficits mentioned below, see PT Problem List. Pt s/p L AKA 11/11/20 and reports has since then not stood all the way up yet and was performing squat pivot or sliding board transfers at the SNF with assistance. He reports he is working with PT there. Pt dependent on his wife to push him in a w/c for mobility. Currently, pt demonstrates impaired generalized strength, ROM, coordination, balance, and activity tolerance. Pt needing modA to transition to sit EOB and to perform full sit to stand transfer with R knee blocked and R UE support on PT. Pt also limited in mobility by pain in his residual limb and his R 5th toe, which is black in color medially and with erythema (RN notified). Pt would benefit from further acute PT and follow-up with PT at his SNF to address his deficits to maximize his independence and safety with all functional mobility and thereby improve his quality of life.    Follow Up Recommendations SNF    Equipment Recommendations  None recommended by PT (defer to next venue of care)    Recommendations for Other Services       Precautions / Restrictions Precautions Precautions: Fall Precaution Comments: L AKA      Mobility  Bed Mobility Overal bed  mobility: Needs Assistance Bed Mobility: Supine to Sit;Sit to Supine     Supine to sit: Mod assist Sit to supine: Min assist   General bed mobility comments: ModA with pt pulling up on PT to ascend trunk and scoot hips to square with EOB. MinA for safety to manage pt's legs back into bed.    Transfers Overall transfer level: Needs assistance   Transfers: Sit to/from Stand Sit to Stand: Mod assist         General transfer comment: ModA to power up to stand with extra time and R knee blocked and pt hugging onto PT. Performed several anterior<>posterior trunk rocking with pt cued to place chest on PT's shoulder to decrease his initial fear of leaning anteriorly prior to sit to stand transfer, success noted.  Ambulation/Gait             General Gait Details: NT, pt has not ambulated since his AKA  Stairs            Wheelchair Mobility    Modified Rankin (Stroke Patients Only)       Balance Overall balance assessment: Needs assistance Sitting-balance support: No upper extremity supported;Feet supported Sitting balance-Leahy Scale: Fair Sitting balance - Comments: Static sitting EOB, min guard.   Standing balance support: Single extremity supported Standing balance-Leahy Scale: Poor Standing balance comment: ModA and R knee block with pt holding onto PT with R hand to stand upright for ~15 sec before returning to sit.  Pertinent Vitals/Pain Pain Assessment: Faces Faces Pain Scale: Hurts even more Pain Location: L AKA, R 5th toe Pain Descriptors / Indicators: Discomfort;Grimacing;Guarding Pain Intervention(s): Limited activity within patient's tolerance;Monitored during session;Repositioned;Premedicated before session    Home Living Family/patient expects to be discharged to:: Skilled nursing facility                 Additional Comments: Pt reports he has not yet stood up since his L AKA surgery, but has been  performing transfers via squat pivot or using the sliding board. Pt reports he sits in a w/c that his wife pushes for him for mobility since his AKA.    Prior Function Level of Independence: Needs assistance   Gait / Transfers Assistance Needed: Since AKA, pt has needed physical assistance for squat pivot or sliding board transfers between surfaces and is dependent on his wife to push him in a w/c.           Hand Dominance   Dominant Hand: Right (L hemiparesis)    Extremity/Trunk Assessment   Upper Extremity Assessment Upper Extremity Assessment: Defer to OT evaluation    Lower Extremity Assessment Lower Extremity Assessment: Generalized weakness;LLE deficits/detail;RLE deficits/detail RLE Deficits / Details: R medial fifth toe black in color and with erythema LLE Deficits / Details: L AKA    Cervical / Trunk Assessment Cervical / Trunk Assessment: Kyphotic  Communication   Communication:  (soft voice, difficult to understand at times)  Cognition Arousal/Alertness: Awake/alert Behavior During Therapy: [facility] for tasks assessed/performed Overall Cognitive Status: No family/caregiver present to determine baseline cognitive functioning                                 General Comments: Pt with difficulty recalling current date, saying it was April then changed his answer to May. He said he is unsure of the date due to being in a SNF for so long. Pt needing cues to sequence mobility. No family present to confirm current cognitive status.      General Comments      Exercises     Assessment/Plan    PT Assessment Patient needs continued PT services  PT Problem List Decreased strength;Decreased range of motion;Decreased activity tolerance;Decreased balance;Decreased mobility;Decreased coordination;Decreased knowledge of use of DME;Decreased safety awareness;Decreased knowledge of precautions;Obesity;Pain;Decreased cognition       PT Treatment Interventions DME  instruction;Gait training;Functional mobility training;Therapeutic exercise;Therapeutic activities;Balance training;Neuromuscular re-education;Cognitive remediation;Patient/family education;Wheelchair mobility training    PT Goals (Current goals can be found in the Care Plan section)  Acute Rehab PT Goals Patient Stated Goal: to have a better life PT Goal Formulation: With patient Time For Goal Achievement: 01/26/21 Potential to Achieve Goals: Good    Frequency Min 2X/week   Barriers to discharge        Co-evaluation               AM-PAC PT "6 Clicks" Mobility  Outcome Measure Help needed turning from your back to your side while in a flat bed without using bedrails?: A Lot Help needed moving from lying on your back to sitting on the side of a flat bed without using bedrails?: A Lot Help needed moving to and from a bed to a chair (including a wheelchair)?: A Lot Help needed standing up from a chair using your arms (e.g., wheelchair or bedside chair)?: A Lot Help needed to walk in hospital room?: Total Help needed climbing 3-5 steps  with a railing? : Total 6 Click Score: 10    End of Session Equipment Utilized During Treatment: Gait belt Activity Tolerance: Patient tolerated treatment well Patient left: in bed;with call bell/phone within reach;with bed alarm set Nurse Communication: Mobility status;Other (comment) (R 5th toe black with erythema noted) PT Visit Diagnosis: Unsteadiness on feet (R26.81);Muscle weakness (generalized) (M62.81);Difficulty in walking, not elsewhere classified (R26.2);Pain Pain - Right/Left:  (L residual limb, R 5th toe) Pain - part of body: Leg;Ankle and joints of foot    Time: 1048-1106 PT Time Calculation (min) (ACUTE ONLY): 18 min   Charges:   PT Evaluation $PT Eval Moderate Complexity: 1 Mod          Moishe Spice, PT, DPT Acute Rehabilitation Services  Pager: [phone] Office: [place] 01/12/2021, 1:01  PM

## 2021-01-12 NOTE — Progress Notes (Signed)
Pharmacy Antibiotic Note  [name] is a 70 y.o. male admitted on 01/10/2021 with bleeding from his recent L AKA wound. .  Pharmacy has been consulted for levaquin dosing. Patient was on cephalexin with 2 weeks of antibiotics planned, new concern for pseudomonas from wound (culture pending) so antibiotics being changed to levofloxacin.   Patient currently afebrile, wbc 11, renal function is normal.   Plan: Levofloxacin 750mg  daily  Will stop cephalexin for now  Height: 5\' 7"  (170.2 cm) Weight: 86.5 kg (190 lb 11.2 oz) IBW/kg (Calculated) : 66.1  Temp (24hrs), Avg:98.4 F (36.9 C), Min:98 F (36.7 C), Max:98.8 F (37.1 C)  Recent Labs  Lab 01/10/21 1640 01/11/21 0211 01/11/21 1909 01/12/21 0244  WBC 12.5* 10.6* 12.1* 11.8*  CREATININE 0.87 0.87 1.02 0.88    Estimated Creatinine Clearance: 84.4 mL/min (by C-G formula based on SCr of 0.88 mg/dL).    Allergies  Allergen Reactions  . Flexeril [Cyclobenzaprine] Shortness Of Breath  . Tamsulosin Hcl Swelling, Rash and Other (See Comments)    Swelling around the mouth with rash on face Swelling around the mouth with rash on face Swelling around the mouth with rash on face  Swelling around the mouth with rash on face  . Tramadol Other (See Comments)    Headache  . Robaxin [Methocarbamol] Rash  . Aspirin     On plavix  . Morphine And Related Itching    Thank you for allowing pharmacy to be a part of this patient's care.  Erin Hearing PharmD., BCPS Clinical Pharmacist 01/12/2021 6:04 PM

## 2021-01-13 LAB — CBC WITH DIFFERENTIAL/PLATELET
Abs Immature Granulocytes: 0.11 10*3/uL — ABNORMAL HIGH (ref 0.00–0.07)
Basophils Absolute: 0.1 10*3/uL (ref 0.0–0.1)
Basophils Relative: 1 %
Eosinophils Absolute: 0.2 10*3/uL (ref 0.0–0.5)
Eosinophils Relative: 1 %
HCT: 29.3 % — ABNORMAL LOW (ref 39.0–52.0)
Hemoglobin: 9.2 g/dL — ABNORMAL LOW (ref 13.0–17.0)
Immature Granulocytes: 1 %
Lymphocytes Relative: 22 %
Lymphs Abs: 2.6 10*3/uL (ref 0.7–4.0)
MCH: 30.9 pg (ref 26.0–34.0)
MCHC: 31.4 g/dL (ref 30.0–36.0)
MCV: 98.3 fL (ref 80.0–100.0)
Monocytes Absolute: 1.1 10*3/uL — ABNORMAL HIGH (ref 0.1–1.0)
Monocytes Relative: 9 %
Neutro Abs: 7.8 10*3/uL — ABNORMAL HIGH (ref 1.7–7.7)
Neutrophils Relative %: 66 %
Platelets: 300 10*3/uL (ref 150–400)
RBC: 2.98 MIL/uL — ABNORMAL LOW (ref 4.22–5.81)
RDW: 14.5 % (ref 11.5–15.5)
WBC: 11.9 10*3/uL — ABNORMAL HIGH (ref 4.0–10.5)
nRBC: 0 % (ref 0.0–0.2)

## 2021-01-13 LAB — BASIC METABOLIC PANEL
Anion gap: 9 (ref 5–15)
BUN: 12 mg/dL (ref 8–23)
CO2: 23 mmol/L (ref 22–32)
Calcium: 8.7 mg/dL — ABNORMAL LOW (ref 8.9–10.3)
Chloride: 102 mmol/L (ref 98–111)
Creatinine, Ser: 0.92 mg/dL (ref 0.61–1.24)
GFR, Estimated: >60 mL/min (ref >60)
Glucose, Bld: 144 mg/dL — ABNORMAL HIGH (ref 70–99)
Potassium: 4 mmol/L (ref 3.5–5.1)
Sodium: 134 mmol/L — ABNORMAL LOW (ref 135–145)

## 2021-01-13 LAB — GLUCOSE, CAPILLARY
Glucose-Capillary: 144 mg/dL — ABNORMAL HIGH (ref 70–99)
Glucose-Capillary: 127 mg/dL — ABNORMAL HIGH (ref 70–99)
Glucose-Capillary: 159 mg/dL — ABNORMAL HIGH (ref 70–99)
Glucose-Capillary: 142 mg/dL — ABNORMAL HIGH (ref 70–99)

## 2021-01-13 LAB — PROCALCITONIN: Procalcitonin: <0.1 ng/mL

## 2021-01-13 LAB — PHOSPHORUS: Phosphorus: 4.2 mg/dL (ref 2.5–4.6)

## 2021-01-13 LAB — MAGNESIUM: Magnesium: 1.8 mg/dL (ref 1.7–2.4)

## 2021-01-13 LAB — TSH: TSH: 4.015 u[IU]/mL (ref 0.350–4.500)

## 2021-01-13 MED ORDER — OXYCODONE HCL 5 MG PO TABS
5.0000 mg | ORAL_TABLET | ORAL | Status: DC | PRN
  Administered 2021-01-13 – 2021-01-17 (×16): 5 mg via ORAL
  Filled 2021-01-13 (×16): qty 1

## 2021-01-13 MED ORDER — MORPHINE SULFATE (PF) 2 MG/ML IV SOLN
2.0000 mg | INTRAVENOUS | Status: DC | PRN
  Administered 2021-01-13 – 2021-01-16 (×7): 2 mg via INTRAVENOUS
  Filled 2021-01-13 (×7): qty 1

## 2021-01-13 MED ORDER — SACCHAROMYCES BOULARDII 250 MG PO CAPS
250.0000 mg | ORAL_CAPSULE | Freq: Two times a day (BID) | ORAL | Status: DC
  Administered 2021-01-13 – 2021-01-17 (×9): 250 mg via ORAL
  Filled 2021-01-13 (×10): qty 1

## 2021-01-13 MED ORDER — SENNOSIDES-DOCUSATE SODIUM 8.6-50 MG PO TABS
2.0000 | ORAL_TABLET | Freq: Every day | ORAL | Status: DC
  Administered 2021-01-13 – 2021-01-14 (×2): 2 via ORAL
  Filled 2021-01-13 (×4): qty 2

## 2021-01-13 MED ORDER — ENOXAPARIN SODIUM 40 MG/0.4ML ~~LOC~~ SOLN
40.0000 mg | SUBCUTANEOUS | Status: DC
  Administered 2021-01-13 – 2021-01-15 (×3): 40 mg via SUBCUTANEOUS
  Filled 2021-01-13 (×3): qty 0.4

## 2021-01-13 MED ORDER — LOPERAMIDE HCL 2 MG PO CAPS: 2.0000 mg | ORAL_CAPSULE | ORAL | Status: DC | PRN

## 2021-01-13 NOTE — TOC Initial Note (Signed)
Transition of Care [facility]) - Initial/Assessment Note    Patient Details  Name: [name] MRN: [id number] Date of Birth: [birth year]  [facility]) CM/SW Contact:    Trula Ore, [place] Phone Number: 01/13/2021, 11:18 AM  Clinical Narrative:                 CSW received consult for possible SNF placement at time of discharge. CSW spoke with patient at beside regarding PT recommendation of SNF placement at time of discharge. Patient comes from [facility] term. Patient reports plan is to return to [facility] when medically ready. Patient expressed understanding of PT recommendation and is agreeable to SNF placement at time of discharge.   Patient has received the COVID vaccines as well as the booster. Patient expressed being hopeful for rehab and to feel better soon. No further questions reported at this time. CSW to continue to follow and assist with discharge planning needs.    Expected Discharge Plan: Skilled Nursing Facility Barriers to Discharge: Continued Medical Work up   Patient Goals and CMS Choice Patient states their goals for this hospitalization and ongoing recovery are:: to go to SNF CMS Medicare.gov Compare Post Acute Care list provided to:: Patient Choice offered to / list presented to : Patient  Expected Discharge Plan and Services Expected Discharge Plan: [place] In-house Referral: Clinical Social Work     Living arrangements for the past 2 months: [name] (was recently at [facility] for short term rehab)                                      Prior Living Arrangements/Services Living arrangements for the past 2 months: [name] (was recently at [facility] for short term rehab) Lives with:: Self Patient language and need for interpreter reviewed:: Yes Do you feel safe going back to the place where you live?: No   SNF  Need for Family Participation in Patient  Care: Yes (Comment) Care giver support system in place?: Yes (comment)   Criminal Activity/Legal Involvement Pertinent to Current Situation/Hospitalization: No - Comment as needed  Activities of Daily Living Home Assistive Devices/Equipment: None ADL Screening (condition at time of admission) Patient's cognitive ability adequate to safely complete daily activities?: Yes Is the patient deaf or have difficulty hearing?: No Does the patient have difficulty seeing, even when wearing glasses/contacts?: No Does the patient have difficulty concentrating, remembering, or making decisions?: No Patient able to express need for assistance with ADLs?: Yes Does the patient have difficulty dressing or bathing?: Yes Independently performs ADLs?: No Communication: Independent Dressing (OT): Needs assistance Does the patient have difficulty walking or climbing stairs?: Yes Weakness of Legs: Left Weakness of Arms/Hands: None (bka)  Permission Sought/Granted Permission sought to share information with : Case Manager,Family Chief Financial Officer    Share Information with NAME: [name]  Permission granted to share info w AGENCY: SNF  Permission granted to share info w Relationship: spouse  Permission granted to share info w Contact Information: [name] [phone]  Emotional Assessment Appearance:: Appears stated age Attitude/Demeanor/Rapport: Gracious Affect (typically observed): Calm Orientation: : Oriented to Self,Oriented to Place,Oriented to Situation Alcohol / Substance Use: Not Applicable Psych Involvement: No (comment)  Admission diagnosis:  Surgical wound dehiscence [T81.31XA] Bleeding from wound [T14.8XXA] Patient Active Problem List   Diagnosis Date Noted  . Surgical wound dehiscence  01/10/2021  . Atrial fibrillation, chronic ([place]) 01/10/2021  . Pressure injury of skin 12/11/2020  . Septic shock ([place])   . Acute respiratory failure with hypoxia ([place])   . Multifocal  pneumonia   . Palliative care by specialist   . Goals of care, counseling/discussion   . Osteomyelitis of left foot ([place])   . Cardiomyopathy ([place])   . PAD (peripheral artery disease) /SMA Stenosis/Rt and Lt SFA Stenosis/ 10/28/2020  . SIRS (systemic inflammatory response syndrome) ([place]) 10/26/2020  . Sepsis --due to Infected Lt Foot/Ostepmyleitis---POA 10/26/2020  . Acute osteomyelitis of toe, left ([place]) 10/25/2020  . Diabetic ulcer of left heel ([place]) 10/25/2020  . Fracture of left toe 10/25/2020  . Leukocytosis 10/25/2020  . Hypokalemia 10/25/2020  . Hypoalbuminemia 10/25/2020  . Hyperglycemia due to diabetes mellitus ([place]) 10/25/2020  . History of stroke 10/25/2020  . Abdominal pain, epigastric 02/09/2017  . Morbid obesity ([place]) 09/17/2016  . Sedentary lifestyle 11/20/2015  . Type 2 diabetes mellitus with vascular disease ([place]) 07/25/2015  . Bilateral carotid artery disease ([place]) 05/01/2015  . Atherosclerosis of artery of extremity with ulceration ([place]) 04/08/2015  . Lower extremity edema 02/11/2015  . Tobacco use disorder 07/16/2014  . Chest pain 06/30/2014  . OSA on CPAP 06/30/2014  . Aftercare following surgery of the circulatory system, [place] 12/27/2013  . Chronic pain syndrome 11/03/2013  . Contracture of muscle of left upper arm 10/10/2013  . Foot fracture 10/10/2013  . Generalized OA 10/10/2013  . Anemia, iron deficiency 09/26/2013  . Insomnia 09/26/2013  . Stasis dermatitis 07/31/2013  . Peripheral autonomic neuropathy due to diabetes mellitus ([place]) 07/31/2013  . RLS (restless legs syndrome) 07/31/2013  . Allergic rhinitis 07/28/2013  . CAD, multiple vessel, 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 06/21/2013  . Essential hypertension 06/21/2013  . GERD (gastroesophageal reflux disease) 06/21/2013  . BPH (benign prostatic hyperplasia) 06/21/2013  . Cardiomyopathy, ischemic 02/22/2013  . Mixed hyperlipidemia 02/22/2013  . Atrial flutter ([place]) 01/02/2013  . Acute myocardial infarction,  subendocardial infarction, initial episode of care ([place]) 01/02/2013  . Occlusion and stenosis of carotid artery without mention of cerebral infarction 10/27/2012  . CVA (cerebral infarction) 06/03/2012   PCP:  Caprice Renshaw, MD Pharmacy:   [facility], [place] [place] [address] Phone: [phone] Fax: [phone]     Social Determinants of Health (SDOH) Interventions    Readmission Risk Interventions No flowsheet data found.

## 2021-01-13 NOTE — NC FL2 (Signed)
[place] LEVEL OF CARE SCREENING TOOL     IDENTIFICATION  Patient Name: [name] Birthdate: [birth year] Sex: male Admission Date (Current Location): 01/10/2021  [facility] and [place] Number:  Herbalist and Address:  [facility] [place], [place] [address], [place], [place] [address]      Provider Number: [id number]  Attending Physician Name and Address:  Kayleen Memos, DO  Relative Name and Phone Number:  [name] [phone]    Current Level of Care: Hospital Recommended Level of Care: [place] Prior Approval Number:    Date Approved/Denied:   PASRR Number: [id number]  Discharge Plan: SNF    Current Diagnoses: Patient Active Problem List   Diagnosis Date Noted  . Surgical wound dehiscence 01/10/2021  . Atrial fibrillation, chronic ([place]) 01/10/2021  . Pressure injury of skin 12/11/2020  . Septic shock ([place])   . Acute respiratory failure with hypoxia ([place])   . Multifocal pneumonia   . Palliative care by specialist   . Goals of care, counseling/discussion   . Osteomyelitis of left foot ([place])   . Cardiomyopathy ([place])   . PAD (peripheral artery disease) /SMA Stenosis/Rt and Lt SFA Stenosis/ 10/28/2020  . SIRS (systemic inflammatory response syndrome) ([place]) 10/26/2020  . Sepsis --due to Infected Lt Foot/Ostepmyleitis---POA 10/26/2020  . Acute osteomyelitis of toe, left ([place]) 10/25/2020  . Diabetic ulcer of left heel ([place]) 10/25/2020  . Fracture of left toe 10/25/2020  . Leukocytosis 10/25/2020  . Hypokalemia 10/25/2020  . Hypoalbuminemia 10/25/2020  . Hyperglycemia due to diabetes mellitus ([place]) 10/25/2020  . History of stroke 10/25/2020  . Abdominal pain, epigastric 02/09/2017  . Morbid obesity ([place]) 09/17/2016  . Sedentary lifestyle 11/20/2015  . Type 2 diabetes mellitus with vascular disease ([place]) 07/25/2015  . Bilateral carotid artery disease ([place]) 05/01/2015  . Atherosclerosis of artery of extremity with  ulceration ([place]) 04/08/2015  . Lower extremity edema 02/11/2015  . Tobacco use disorder 07/16/2014  . Chest pain 06/30/2014  . OSA on CPAP 06/30/2014  . Aftercare following surgery of the circulatory system, [place] 12/27/2013  . Chronic pain syndrome 11/03/2013  . Contracture of muscle of left upper arm 10/10/2013  . Foot fracture 10/10/2013  . Generalized OA 10/10/2013  . Anemia, iron deficiency 09/26/2013  . Insomnia 09/26/2013  . Stasis dermatitis 07/31/2013  . Peripheral autonomic neuropathy due to diabetes mellitus ([place]) 07/31/2013  . RLS (restless legs syndrome) 07/31/2013  . Allergic rhinitis 07/28/2013  . CAD, multiple vessel, 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 06/21/2013  . Essential hypertension 06/21/2013  . GERD (gastroesophageal reflux disease) 06/21/2013  . BPH (benign prostatic hyperplasia) 06/21/2013  . Cardiomyopathy, ischemic 02/22/2013  . Mixed hyperlipidemia 02/22/2013  . Atrial flutter ([place]) 01/02/2013  . Acute myocardial infarction, subendocardial infarction, initial episode of care ([place]) 01/02/2013  . Occlusion and stenosis of carotid artery without mention of cerebral infarction 10/27/2012  . CVA (cerebral infarction) 06/03/2012    Orientation RESPIRATION BLADDER Height & Weight     Self,Situation,Place  Normal Incontinent Weight: 188 lb 0.8 oz (85.3 kg) Height:  5\' 7"  (170.2 cm)  BEHAVIORAL SYMPTOMS/MOOD NEUROLOGICAL BOWEL NUTRITION STATUS      Incontinent Diet (See Discharge Summary)  AMBULATORY STATUS COMMUNICATION OF NEEDS Skin   Limited Assist Verbally Other (Comment) (amputation leg left,wound/incision open or dehiced leg left,upper,mid,compression wrap changed,clean,dry,intact)                       Personal Care Assistance Level  of Assistance  Bathing,Feeding,Dressing Bathing Assistance: Maximum assistance Feeding assistance: Limited assistance (Needs assist sitting up) Dressing Assistance: Maximum assistance     Functional Limitations Info   Sight,Hearing,Speech   Hearing Info: Adequate Speech Info: Adequate    SPECIAL CARE FACTORS FREQUENCY  PT (By licensed PT),OT (By licensed OT)     PT Frequency: 5x min weekly OT Frequency: 5x min weekly            Contractures Contractures Info: Not present    Additional Factors Info  Code Status,Allergies,Insulin Sliding Scale Code Status Info: FULL Allergies Info: Flexeril (cyclobenzaprine),Tamsulosin Hcl,Tramadol,Robaxin (methocarbamol),Aspirin,Morphine And Related   Insulin Sliding Scale Info: insulin aspart (novoLOG) injection 0-5 Units daily at bedtime,insulin aspart (novoLOG) injection 0-9 Units 3 times daily with meals,       Current Medications (01/13/2021):  This is the current hospital active medication list Current Facility-Administered Medications  Medication Dose Route Frequency Provider Last Rate Last Admin  . acetaminophen (TYLENOL) tablet 650 mg  650 mg Oral Q6H PRN Opyd, Ilene Qua, MD   650 mg at 01/13/21 0033   Or  . acetaminophen (TYLENOL) suppository 650 mg  650 mg Rectal Q6H PRN Opyd, Ilene Qua, MD      . amiodarone (PACERONE) tablet 200 mg  200 mg Oral Daily Opyd, Ilene Qua, MD   200 mg at 01/13/21 0830  . atorvastatin (LIPITOR) tablet 40 mg  40 mg Oral QHS Vianne Bulls, MD   40 mg at 01/12/21 2152  . donepezil (ARICEPT) tablet 5 mg  5 mg Oral QHS Opyd, Ilene Qua, MD   5 mg at 01/12/21 2152  . finasteride (PROSCAR) tablet 5 mg  5 mg Oral Q1200 Opyd, Ilene Qua, MD   5 mg at 01/12/21 1256  . insulin aspart (novoLOG) injection 0-5 Units  0-5 Units Subcutaneous QHS Vianne Bulls, MD   4 Units at 01/10/21 2204  . insulin aspart (novoLOG) injection 0-9 Units  0-9 Units Subcutaneous TID WC Opyd, Ilene Qua, MD   1 Units at 01/13/21 0829  . levofloxacin (LEVAQUIN) IVPB 750 mg  750 mg Intravenous Q24H Lyndee Leo, [facility]   Stopped at 01/12/21 1846  . memantine (NAMENDA) tablet 5 mg  5 mg Oral BID Opyd, Ilene Qua, MD   5 mg at 01/13/21 0830  . metoprolol  tartrate (LOPRESSOR) tablet 12.5 mg  12.5 mg Oral BID Irene Pap N, DO   12.5 mg at 01/13/21 0830  . morphine 2 MG/ML injection 2 mg  2 mg Intravenous Q3H PRN Irene Pap N, DO   2 mg at 01/13/21 1116  . ondansetron (ZOFRAN) tablet 4 mg  4 mg Oral Q6H PRN Opyd, Ilene Qua, MD       Or  . ondansetron (ZOFRAN) injection 4 mg  4 mg Intravenous Q6H PRN Opyd, Ilene Qua, MD      . oxyCODONE (Oxy IR/ROXICODONE) immediate release tablet 5 mg  5 mg Oral Q4H PRN Hall, Carole N, DO      . pantoprazole (PROTONIX) EC tablet 40 mg  40 mg Oral Daily Opyd, Ilene Qua, MD   40 mg at 01/13/21 0830  . Resource ThickenUp Clear   Oral PRN Irene Pap N, DO      . senna-docusate (Senokot-S) tablet 1 tablet  1 tablet Oral QHS PRN Opyd, Ilene Qua, MD      . senna-docusate (Senokot-S) tablet 2 tablet  2 tablet Oral QHS Hall, Carole N, DO      . sodium  hypochlorite (DAKIN'S 1/4 STRENGTH) topical solution   Irrigation Daily Marty Heck, MD   Given at 01/13/21 [phone]  . traMADol (ULTRAM) tablet 50 mg  50 mg Oral Q6H PRN Opyd, Ilene Qua, MD   50 mg at 01/13/21 8563     Discharge Medications: Please see discharge summary for a list of discharge medications.  Relevant Imaging Results:  Relevant Lab Results:   Additional Information [phone]  Trula Ore, LCSWA

## 2021-01-13 NOTE — Progress Notes (Addendum)
[facility]  Subjective  - No new complaints.   Objective 105/66 (!) 46 97.8 F (36.6 C) (Oral) 16 97%  Intake/Output Summary (Last 24 hours) at 01/13/2021 0728 Last data filed at 01/13/2021 0348 Gross per 24 hour  Intake 148.65 ml  Output 350 ml  Net -201.35 ml    Left AKA wound/hematoma dressing changed 1 hour ago by RN minimal SS drainage, dressing wet to dry with Dakin's solution Right 5th toe ulcer with gangrene   Assessment/Plan: Left AKA wound with pseudomonas wet to dry dakin's daily   In review of the notes previously seen by Dr. Oneida Alar and offered AKA on the left given he was nonambulatory so likely not candidate for extensive revascularization of right leg given non-ambulatory with previous stroke.  Mild leukocytosis WBC 11.8 on IV Levaquin Afebrile  Pending future plans once Dr. Oneida Alar has examined the patient  Roxy Horseman 01/13/2021 7:28 AM --  Laboratory Lab Results: Recent Labs    01/11/21 1909 01/12/21 0244  WBC 12.1* 11.8*  HGB 8.4* 8.6*  HCT 27.1* 26.8*  PLT 265 238   BMET Recent Labs    01/11/21 1909 01/12/21 0244  NA 134* 135  K 4.8 4.4  CL 103 105  CO2 25 23  GLUCOSE 215* 179*  BUN 18 15  CREATININE 1.02 0.88  CALCIUM 8.2* 8.4*    COAG Lab Results  Component Value Date   INR 1.5 (H) 01/10/2021   INR 1.7 (H) 12/11/2020   INR 2.2 (H) 12/10/2020   No results found for: PTT  VASCULAR STAFF ADDENDUM: I have independently interviewed and examined the patient. I agree with the above.  Continue local care to R AKA hematoma sinus with twice daily Dakins and gauze dressing changes. Not a candidate for revascularization of left leg, would only offer him a palliative amputation.  Yevonne Aline. Stanford Breed, MD [facility] Phone Number: [phone] 01/13/2021 12:16 PM

## 2021-01-13 NOTE — Progress Notes (Addendum)
PROGRESS NOTE  [name] WSF:[id number] DOB: 1951-09-29 DOA: 01/10/2021 PCP: Caprice Renshaw, MD  HPI/Recap of past 24 hours: [name] is a 70 y.o. 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  Patient underwent a left AKA on 11/11/2020, was admitted to the hospital in the interim with septic shock secondary to pneumonia, was intubated initially, and discharged to SNF on 12/18/2020 on room air.  His wife is at the bedside and assist with the history.  They report bleeding from the left AKA wound for at least a couple weeks now.  There was recent notation of a fall onto the left AKA but patient and his wife deny this. Patient reports some pain at the stump but denies any fevers, cough, chest pain, or shortness of breath.  He reportedly had a blood pressure of 81/50 with EMS and was given 700 cc of saline prior to arrival in the ED.Marland Kitchen  Seen by vascular surgery.  Change of left stump dressing on 01/12/2021 by vascular surgery revealed some bluish discoloration on the dressing concerning for Pseudomonas.  Superficial wound culture ordered and started on IV Levaquin empirically.  Patient reports having some pain in his fifth right toe.  Evaluated by vascular surgery, likely not a candidate for extensive revascularization on the right leg given nonambulatory with previous stroke.  01/13/21: Patient was seen and examined at his bedside.  He reports having diarrhea last night and this morning.  Started on probiotics and Imodium as needed.  Denies having any abdominal pain or nausea.  Assessment/Plan: Principal Problem:   Surgical wound dehiscence Active  Problems:   Cardiomyopathy, ischemic   CAD, multiple vessel, 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   OSA on CPAP   Type 2 diabetes mellitus with vascular disease (HCC)   Atrial fibrillation, chronic (HCC)  Bleeding from left AKA wound  - Presents with persistent bleeding from left AKA wound, seen by vascular surgery in ED, initially being considered for washout and wound vac, recommends holding Eliquis for now, on hold since admission.  -Hemoglobin uptrending 9.2 from 8.6 from 8.4. -Local dressing change per vascular surgery. -Continue to monitor H&H  Suspected left AKA wound, possible Pseudomonas infection During change of dressing on 01/12/2021 by vascular surgery there was some bluish discoloration on the dressing concerning for Pseudomonas.   Follow superficial wound culture ordered on 01/12/2021. Continue IV Levaquin, started on 01/12/2021.   WBC mildly elevated 11.9. Continue to follow cultures.  Loose stools Reports loose stools yesterday and this morning. Started Florastor 250 mg twice daily Imodium as needed  Permanent A. fib with RVR Continue home p.o. Lopressor at lower dose to avoid beta-blockade withdrawal. Continue home amiodarone. Eliquis on hold due to recent hematoma from left AKA Resume Eliquis if okay with vascular surgery. Continue to closely monitor on telemetry  History of CVA Continue home Lipitor 40 mg daily He was on Eliquis for secondary CVA prevention in the setting of permanent A. Fib Resume Eliquis when okay with vascular surgery.  Left fifth toe dry gangrene Evaluated by vascular surgery likely not a candidate for extensive revascularization on the right leg given nonambulatory with previous stroke. Management per vascular surgery.  Chronic systolic CHF  -  Appears compensated  - EF was 20-25% one month ago  Diuretics are currently on hold Continue strict I's and O's and daily weight Net I&O -1.0 L.  CAD  - No anginal complaints  - Continue statin     Insulin-dependent DM 2 with hyperglycemia. - A1c was 6.9% in January 2022  - Continue CBG checks and insulin    Dementia  - Continue Namenda and Aricept  - Delirium precautions   Tobacco use disorder Nicotine patch not order to avoid poor wound healing in the setting of PVD.   DVT prophylaxis:  Subcu Lovenox daily.  Hemoglobin is uptrending.  Closely monitor H&H while on pharmacological DVT prophylaxis.  Prior to admission patient was on Eliquis for secondary CVA prevention, held due to hematoma and acute blood loss on presentation.  Code Status: Full   Level of Care: Level of care: Telemetry Cardiac Family Communication: none at bedside Disposition Plan:  Patient is from: SNF  Anticipated d/c is to: SNF  Anticipated d/c date is: ~01/14/21 Patient currently: not safe for dc, ongoing management of left AKA wound. Consults called: Vascular surgery  Admission status: Inpatient       Status is: Inpatient    Dispo:  Patient From: [facility]  Planned Disposition: [place]  Medically stable for discharge: No          Objective: Vitals:   01/13/21 0003 01/13/21 0345 01/13/21 0830 01/13/21 1232  BP: 115/68 105/66 131/68 102/65  Pulse: 96 (!) 46 (!) 103 100  Resp: 15 16 18 17   Temp: 98.2 F (36.8 C) 97.8 F (36.6 C) 97.7 F (36.5 C) 97.9 F (36.6 C)  TempSrc: Oral Oral  Oral  SpO2: 94% 97% 99% 94%  Weight:  85.3 kg    Height:        Intake/Output Summary (Last 24 hours) at 01/13/2021 1523 Last data filed at 01/13/2021 0348 Gross per 24 hour  Intake 148.65 ml  Output 350 ml  Net -201.35 ml   Filed Weights   01/11/21 0111 01/12/21 0342 01/13/21 0345  Weight: 87.2 kg 86.5 kg 85.3 kg    Exam:  . General: 70 y.o. year-old male chronically ill-appearing in no distress.  He is alert and interactive.   . Cardiovascular: Irregular rate and rhythm no rubs or gallops. Marland Kitchen Respiratory: Clear to auscultation with no wheezes or  rales. . Abdomen: Soft nontender normal bowel sounds present. . Musculoskeletal: Dry gangrene affecting the right fifth toe.   . Skin: left AKA wrapped with surgical dressing. Marland Kitchen Psychiatry: Mood is appropriate for condition and setting.  Data Reviewed: CBC: Recent Labs  Lab 01/10/21 1640 01/11/21 0211 01/11/21 1909 01/12/21 0244 01/13/21 0728  WBC 12.5* 10.6* 12.1* 11.8* 11.9*  NEUTROABS 8.5*  --   --   --  7.8*  HGB 9.0* 8.2* 8.4* 8.6* 9.2*  HCT 28.6* 26.1* 27.1* 26.8* 29.3*  MCV 100.7* 98.5 100.0 97.5 98.3  PLT 285 260 265 238 017   Basic Metabolic Panel: Recent Labs  Lab 01/10/21 1640 01/11/21 0211 01/11/21 1909 01/12/21 0244 01/13/21 0728  NA 133* 135 134* 135 134*  K 4.2 4.6 4.8 4.4 4.0  CL 103 105 103 105 102  CO2 24 23 25 23 23   GLUCOSE 219* 122* 215* 179* 144*  BUN 24* 22 18 15 12   CREATININE 0.87 0.87 1.02 0.88 0.92  CALCIUM 8.2* 8.2* 8.2* 8.4* 8.7*  MG  --   --   --   --  1.8  PHOS  --   --   --   --  4.2   GFR: Estimated Creatinine Clearance: 80.2 mL/min (by C-G formula based on SCr of 0.92 mg/dL). Liver Function Tests: Recent Labs  Lab 01/11/21 1909  AST 15  ALT 13  ALKPHOS 79  BILITOT 0.4  PROT 6.2*  ALBUMIN 2.2*   No results for input(s): LIPASE, AMYLASE in the last 168 hours. No results for input(s): AMMONIA in the last 168 hours. Coagulation Profile: Recent Labs  Lab 01/10/21 1640  INR 1.5*   Cardiac Enzymes: No results for input(s): CKTOTAL, CKMB, CKMBINDEX, TROPONINI in the last 168 hours. BNP (last 3 results) No results for input(s): PROBNP in the last 8760 hours. HbA1C: No results for input(s): HGBA1C in the last 72 hours. CBG: Recent Labs  Lab 01/12/21 1123 01/12/21 1628 01/12/21 2005 01/13/21 0750 01/13/21 1133  GLUCAP 122* 159* 174* 144* 127*   Lipid Profile: No results for input(s): CHOL, HDL, LDLCALC, TRIG, CHOLHDL, LDLDIRECT in the last 72 hours. Thyroid Function Tests: Recent Labs    01/13/21 0728  TSH  4.015   Anemia Panel: No results for input(s): VITAMINB12, FOLATE, FERRITIN, TIBC, IRON, RETICCTPCT in the last 72 hours. Urine analysis:    Component Value Date/Time   COLORURINE YELLOW 12/09/2020 1350   APPEARANCEUR CLEAR 12/09/2020 1350   LABSPEC 1.019 12/09/2020 1350   PHURINE 5.0 12/09/2020 1350   GLUCOSEU NEGATIVE 12/09/2020 1350   HGBUR NEGATIVE 12/09/2020 1350   BILIRUBINUR NEGATIVE 12/09/2020 1350   KETONESUR NEGATIVE 12/09/2020 1350   PROTEINUR NEGATIVE 12/09/2020 1350   UROBILINOGEN 0.2 08/20/2014 2042   NITRITE NEGATIVE 12/09/2020 1350   LEUKOCYTESUR NEGATIVE 12/09/2020 1350   Sepsis Labs: @LABRCNTIP (procalcitonin:4,lacticidven:4)  )No results found for this or any previous visit (from the past 240 hour(s)).    Studies: No results found.  Scheduled Meds: . amiodarone  200 mg Oral Daily  . atorvastatin  40 mg Oral QHS  . donepezil  5 mg Oral QHS  . finasteride  5 mg Oral Q1200  . insulin aspart  0-5 Units Subcutaneous QHS  . insulin aspart  0-9 Units Subcutaneous TID WC  . memantine  5 mg Oral BID  . metoprolol tartrate  12.5 mg Oral BID  . pantoprazole  40 mg Oral Daily  . saccharomyces boulardii  250 mg Oral BID  . senna-docusate  2 tablet Oral QHS  . sodium hypochlorite   Irrigation Daily    Continuous Infusions: . levofloxacin (LEVAQUIN) IV Stopped (01/12/21 1846)     LOS: 3 days     Kayleen Memos, MD [facility] Pager [phone]  If 7PM-7AM, please contact night-coverage [email or web address] Password [facility] 01/13/2021, 3:23 PM

## 2021-01-14 LAB — GLUCOSE, CAPILLARY
Glucose-Capillary: 128 mg/dL — ABNORMAL HIGH (ref 70–99)
Glucose-Capillary: 147 mg/dL — ABNORMAL HIGH (ref 70–99)
Glucose-Capillary: 120 mg/dL — ABNORMAL HIGH (ref 70–99)
Glucose-Capillary: 112 mg/dL — ABNORMAL HIGH (ref 70–99)

## 2021-01-14 LAB — BASIC METABOLIC PANEL
Anion gap: 8 (ref 5–15)
BUN: 10 mg/dL (ref 8–23)
CO2: 24 mmol/L (ref 22–32)
Calcium: 8.5 mg/dL — ABNORMAL LOW (ref 8.9–10.3)
Chloride: 103 mmol/L (ref 98–111)
Creatinine, Ser: 0.95 mg/dL (ref 0.61–1.24)
GFR, Estimated: >60 mL/min (ref >60)
Glucose, Bld: 135 mg/dL — ABNORMAL HIGH (ref 70–99)
Potassium: 4 mmol/L (ref 3.5–5.1)
Sodium: 135 mmol/L (ref 135–145)

## 2021-01-14 LAB — CBC
HCT: 29.3 % — ABNORMAL LOW (ref 39.0–52.0)
Hemoglobin: 9.3 g/dL — ABNORMAL LOW (ref 13.0–17.0)
MCH: 31.2 pg (ref 26.0–34.0)
MCHC: 31.7 g/dL (ref 30.0–36.0)
MCV: 98.3 fL (ref 80.0–100.0)
Platelets: 318 10*3/uL (ref 150–400)
RBC: 2.98 MIL/uL — ABNORMAL LOW (ref 4.22–5.81)
RDW: 14.5 % (ref 11.5–15.5)
WBC: 10.3 10*3/uL (ref 4.0–10.5)
nRBC: 0 % (ref 0.0–0.2)

## 2021-01-14 LAB — PHOSPHORUS: Phosphorus: 4 mg/dL (ref 2.5–4.6)

## 2021-01-14 LAB — MAGNESIUM: Magnesium: 1.8 mg/dL (ref 1.7–2.4)

## 2021-01-14 MED ORDER — LEVOFLOXACIN 750 MG PO TABS
750.0000 mg | ORAL_TABLET | Freq: Every day | ORAL | Status: DC
  Administered 2021-01-14 – 2021-01-17 (×4): 750 mg via ORAL
  Filled 2021-01-14 (×4): qty 1
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
## 2021-01-14 NOTE — Progress Notes (Signed)
Physical Therapy Treatment Patient Details Name: [name] MRN: [id number] DOB: June 02, 1951 Today's Date: 01/14/2021    History of Present Illness Pt is a 70 y.o. male admitted from SNF on 01/10/21 with bleeding from L AKA wound. Per vascular, L AKA drainage concern for pseudomonas; plan for conservative management. Pt with RLE vascular changes, not a candidate for extensive revascularization. PMH includes afib, HTN, CAD, ischemic cardiomyopathy (EG 20-25%), DM, PAD (s/p L AKA 11/11/20), SDH, CVA, dementia; of note, 2x recent admissions since L AKA for septic shock secondary to PNA requiring initial ETT with d/c to SNF.   PT Comments    Pt progressing with mobility. Tolerated prolonged seated EOB activity this session with min guard to modA; transfer to recliner deferred secondary to pallor/dizziness and L AKA dressing dislodged with drainage. Sitting BP 115/94, HR 117. Pt remains limited by generalized weakness, pain, impaired balance and poor cognition, including decreased attention and poor awareness. Continue to recommend SNF-level therapies to maximize functional mobility and independence.   Follow Up Recommendations  SNF;Supervision for mobility/OOB     Equipment Recommendations  None recommended by PT    Recommendations for Other Services       Precautions / Restrictions Precautions Precautions: Fall;Other (comment) Precaution Comments: H/o L AKA; bladder/bowel incontinence; LUE hemiparesis    Mobility  Bed Mobility Overal bed mobility: Needs Assistance Bed Mobility: Supine to Sit;Sit to Supine     Supine to sit: Mod assist Sit to supine: Min assist   General bed mobility comments: ModA for LE management and HHA to elevate trunk; minA for return to supine, pt repositioning well in bed with use of RUE/RLE and minA    Transfers                 General transfer comment: Deferred secondary to pt's pallor and c/o dizziness as well as L AKA dressing disloged and wound  draining; seated BP 115/94, HR 117  Ambulation/Gait                 Stairs             Wheelchair Mobility    Modified Rankin (Stroke Patients Only)       Balance Overall balance assessment: Needs assistance Sitting-balance support: No upper extremity supported Sitting balance-Leahy Scale: Fair                                      Cognition Arousal/Alertness: Awake/alert Behavior During Therapy: WFL for tasks assessed/performed Overall Cognitive Status: History of cognitive impairments - at baseline                                 General Comments: Per chart, h/o dementia. Pt tangential and quickly changing topics, easily distracted requiring redirection to current conversation; following majority of simple commands appropriately; poor historian      Exercises General Exercises - Lower Extremity Heel Slides: AROM;Right;Supine Amputee Exercises Hip ABduction/ADduction: AROM;Left;Supine Hip Flexion/Marching: AROM;Left;Supine    General Comments General comments (skin integrity, edema, etc.): Pt's L AKA dressing dislodged during session with drainage from wound; nursing student present and aware, to notify RN      Pertinent Vitals/Pain Pain Assessment: Faces Faces Pain Scale: Hurts even more Pain Location: L AKA Pain Descriptors / Indicators: Discomfort;Guarding;Sore Pain Intervention(s): Monitored during session;Limited activity within patient's tolerance;Repositioned;Patient requesting pain  meds-RN notified    Home Living                      Prior Function            PT Goals (current goals can now be found in the care plan section) Progress towards PT goals: Progressing toward goals    Frequency    Min 2X/week      PT Plan Current plan remains appropriate    Co-evaluation              AM-PAC PT "6 Clicks" Mobility   Outcome Measure  Help needed turning from your back to your side while in  a flat bed without using bedrails?: A Lot Help needed moving from lying on your back to sitting on the side of a flat bed without using bedrails?: A Lot Help needed moving to and from a bed to a chair (including a wheelchair)?: A Lot Help needed standing up from a chair using your arms (e.g., wheelchair or bedside chair)?: A Lot Help needed to walk in hospital room?: Total Help needed climbing 3-5 steps with a railing? : Total 6 Click Score: 10    End of Session   Activity Tolerance: Patient tolerated treatment well;Treatment limited secondary to medical complications (Comment) Patient left: in bed;with call bell/phone within reach (with nursing student present and to turn on bed alarm) Nurse Communication: Mobility status;Patient requests pain meds (dressing off) PT Visit Diagnosis: Unsteadiness on feet (R26.81);Muscle weakness (generalized) (M62.81);Difficulty in walking, not elsewhere classified (R26.2);Pain Pain - part of body: Leg;Ankle and joints of foot     Time: 0851-0909 PT Time Calculation (min) (ACUTE ONLY): 18 min  Charges:  $Therapeutic Activity: 8-22 mins                     Mabeline Caras, PT, DPT Acute Rehabilitation Services  Pager [phone] [place] 01/14/2021, 9:50 AM

## 2021-01-14 NOTE — Progress Notes (Signed)
Patient ID: [name], male   DOB: 20-Jul-1951, 70 y.o.   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  On presentation, he was hypotensive which responded to IV fluids.  Vascular surgery was consulted.  He was started on IV Levaquin empirically.  He was also found to have pain in the right fifth toe for which vascular surgery stated that he is likely not a candidate for extensive revascularization on the right leg given nonambulatory with previous stroke.  Assessment & Plan:   Bleeding from left AKA wound Possible left AKA wound infection -Patient presented with persistent bleeding from left AKA wound.  Vascular surgery following.  Currently on empiric IV Levaquin for possible Pseudomonas infection as well. -Eliquis on hold since admission.  Hemoglobin 9.3 today. -Follow further vascular surgery recommendations.  Dressing as per vascular surgery recommendations  Leukocytosis -Resolved  Permanent A. fib with RVR -Still intermittently tachycardic.  Continue amiodarone, metoprolol.  Eliquis on hold.  History of unspecified CVA -Continue Lipitor.  Resume Eliquis only once cleared by vascular surgery  Left fifth toe dry gangrene -not a candidate for revascularization of left leg as per vascular surgery.  Vascular surgery would only offer him a palliative amputation.  Chronic systolic CHF -Compensated.  EF was 20 to 25% during last admission.  Diuretics on hold.  Continue daily weights, strict  input output and fluid restriction  CAD -Stable.  Continue statin and metoprolol.  Outpatient follow-up with cardiology  Dementia -Continue memantine and donepezil  BPH -Continue finasteride  Generalized deconditioning -Will need SNF placement.  Social worker following -Palliative care consultation for goals of care discussion  DVT prophylaxis: Lovenox Code Status: Full Family Communication: None at bedside Disposition Plan: Status is: Inpatient  Remains inpatient appropriate because:Inpatient level of care appropriate due to severity of illness   Dispo:  Patient From: [place]  Planned Disposition: [place] in 1 to 2 days once cleared by vascular surgery  Medically stable for discharge: No     Consultants: Vascular surgery  Procedures: None  Antimicrobials: Levaquin   Subjective: Patient seen and examined at bedside.  Poor historian.  Pleasantly confused.  No overnight fever, vomiting or worsening shortness of breath reported.  Objective: Vitals:   01/14/21 0025 01/14/21 0405 01/14/21 0805 01/14/21 1030  BP: (!) 100/56 110/66 (!) 125/59 115/67  Pulse: 96 96 (!) 103 (!) 108  Resp: 18 18 20 18   Temp: 98 F (36.7 C) 97.9 F (36.6 C) 97.9 F (36.6 C) 98.1 F (36.7 C)  TempSrc: Oral Oral Oral Oral  SpO2:  98% 100% 97%  Weight:  85.5 kg    Height:        Intake/Output Summary (Last 24 hours) at 01/14/2021 1049 Last data filed at 01/14/2021 1000 Gross per 24 hour  Intake 278.17 ml  Output 450 ml  Net -171.83 ml   Filed Weights   01/12/21 0342 01/13/21 0345 01/14/21 0405  Weight: 86.5 kg 85.3 kg 85.5 kg    Examination:  General exam: Appears calm and comfortable.  Currently  on room air.  Pleasantly confused. Respiratory system: Bilateral decreased breath sounds at bases with scattered crackles Cardiovascular system: S1 & S2 heard, Rate controlled Gastrointestinal system: Abdomen is nondistended, soft and nontender. Normal  bowel sounds heard. Extremities: No cyanosis, clubbing; right AKA dressing present  Central nervous system: Awake, very slow to respond.  Poor historian.  No focal neurological deficits. Moving extremities Skin: No obvious ecchymosis/lesions Psychiatry: Flat affect    Data Reviewed: I have personally reviewed following labs and imaging studies  CBC: Recent Labs  Lab 01/10/21 1640 01/11/21 0211 01/11/21 1909 01/12/21 0244 01/13/21 0728 01/14/21 0317  WBC 12.5* 10.6* 12.1* 11.8* 11.9* 10.3  NEUTROABS 8.5*  --   --   --  7.8*  --   HGB 9.0* 8.2* 8.4* 8.6* 9.2* 9.3*  HCT 28.6* 26.1* 27.1* 26.8* 29.3* 29.3*  MCV 100.7* 98.5 100.0 97.5 98.3 98.3  PLT 285 260 265 238 300 094   Basic Metabolic Panel: Recent Labs  Lab 01/11/21 0211 01/11/21 1909 01/12/21 0244 01/13/21 0728 01/14/21 0317  NA 135 134* 135 134* 135  K 4.6 4.8 4.4 4.0 4.0  CL 105 103 105 102 103  CO2 23 25 23 23 24   GLUCOSE 122* 215* 179* 144* 135*  BUN 22 18 15 12 10   CREATININE 0.87 1.02 0.88 0.92 0.95  CALCIUM 8.2* 8.2* 8.4* 8.7* 8.5*  MG  --   --   --  1.8 1.8  PHOS  --   --   --  4.2 4.0   GFR: Estimated Creatinine Clearance: 77.8 mL/min (by C-G formula based on SCr of 0.95 mg/dL). Liver Function Tests: Recent Labs  Lab 01/11/21 1909  AST 15  ALT 13  ALKPHOS 79  BILITOT 0.4  PROT 6.2*  ALBUMIN 2.2*   No results for input(s): LIPASE, AMYLASE in the last 168 hours. No results for input(s): AMMONIA in the last 168 hours. Coagulation Profile: Recent Labs  Lab 01/10/21 1640  INR 1.5*   Cardiac Enzymes: No results for input(s): CKTOTAL, CKMB, CKMBINDEX, TROPONINI in the last 168 hours. BNP (last 3 results) No results for input(s): PROBNP in the last 8760 hours. HbA1C: No results for input(s): HGBA1C in the last 72 hours. CBG: Recent Labs  Lab 01/13/21 0750 01/13/21 1133 01/13/21 1619 01/13/21 2040 01/14/21 0804  GLUCAP 144* 127* 159* 142* 128*   Lipid Profile: No results for  input(s): CHOL, HDL, LDLCALC, TRIG, CHOLHDL, LDLDIRECT in the last 72 hours. Thyroid Function Tests: Recent Labs    01/13/21 0728  TSH 4.015   Anemia Panel: No results for input(s): VITAMINB12, FOLATE, FERRITIN, TIBC, IRON, RETICCTPCT in the last 72 hours. Sepsis Labs: Recent Labs  Lab 01/12/21 1722  PROCALCITON <0.10    Recent Results (from the past 240 hour(s))  Culture, blood (routine x 2)     Status: None (Preliminary result)   Collection Time: 01/12/21  5:22 PM   Specimen: BLOOD RIGHT HAND  Result Value Ref Range Status   Specimen Description BLOOD RIGHT HAND  Final   Special Requests   Final    BOTTLES DRAWN AEROBIC ONLY Blood Culture results may not be optimal due to an inadequate volume of blood received in culture bottles   Culture   Final    NO GROWTH 2 DAYS Performed at [facility], [place] [address], [place], [place] [address]    Report Status PENDING  Incomplete  Culture, blood (routine x 2)     Status: None (Preliminary result)  Collection Time: 01/12/21  5:25 PM   Specimen: BLOOD RIGHT HAND  Result Value Ref Range Status   Specimen Description BLOOD RIGHT HAND  Final   Special Requests   Final    BOTTLES DRAWN AEROBIC ONLY Blood Culture adequate volume   Culture   Final    NO GROWTH 2 DAYS Performed at [facility], [address] [place], [place] [address]    Report Status PENDING  Incomplete         Radiology Studies: No results found.      Scheduled Meds: . amiodarone  200 mg Oral Daily  . atorvastatin  40 mg Oral QHS  . donepezil  5 mg Oral QHS  . enoxaparin (LOVENOX) injection  40 mg Subcutaneous Q24H  . finasteride  5 mg Oral Q1200  . insulin aspart  0-5 Units Subcutaneous QHS  . insulin aspart  0-9 Units Subcutaneous TID WC  . memantine  5 mg Oral BID  . metoprolol tartrate  12.5 mg Oral BID  . pantoprazole  40 mg Oral Daily  . saccharomyces boulardii  250 mg Oral BID  . senna-docusate  2 tablet Oral QHS  . sodium  hypochlorite   Irrigation Daily   Continuous Infusions: . levofloxacin (LEVAQUIN) IV Stopped (01/13/21 1951)          Aline August, MD [facility] 01/14/2021, 10:49 AM

## 2021-01-14 NOTE — Progress Notes (Addendum)
Patient arrived from [place] to 4E07,patient placed on monitor and CCMD made aware. Vital signs obtained and CHG bath completed call bell with in reach. Upon skin assessment noted area of broken skin on sacrum stage 2. foam placed. See flowsheet. Will monitor patient. Emannuel Vise, Bettina Gavia RN

## 2021-01-14 NOTE — Care Management Important Message (Signed)
Important Message  Patient Details  Name: [name] MRN: [id number] Date of Birth: 01-30-1951   Medicare Important Message Given:  Yes     Shelda Altes 01/14/2021, 12:13 PM

## 2021-01-14 NOTE — Progress Notes (Signed)
Pt. Refused cpap. 

## 2021-01-14 NOTE — Plan of Care (Signed)
  Problem: Education: Goal: Knowledge of General Education information will improve Description: Including pain rating scale, medication(s)/side effects and non-pharmacologic comfort measures Outcome: Progressing   Problem: Clinical Measurements: Goal: Respiratory complications will improve Outcome: Progressing Goal: Cardiovascular complication will be avoided Outcome: Progressing   Problem: Nutrition: Goal: Adequate nutrition will be maintained Outcome: Progressing   Problem: Cardiac: Goal: Ability to achieve and maintain adequate cardiopulmonary perfusion will improve Outcome: Progressing   Problem: Health Behavior/Discharge Planning: Goal: Ability to safely manage health-related needs after discharge will improve Outcome: Progressing   Problem: Cardiac: Goal: Ability to achieve and maintain adequate cardiopulmonary perfusion will improve Outcome: Progressing

## 2021-01-15 LAB — BASIC METABOLIC PANEL
Anion gap: 7 (ref 5–15)
BUN: 10 mg/dL (ref 8–23)
CO2: 23 mmol/L (ref 22–32)
Calcium: 8.5 mg/dL — ABNORMAL LOW (ref 8.9–10.3)
Chloride: 103 mmol/L (ref 98–111)
Creatinine, Ser: 1.01 mg/dL (ref 0.61–1.24)
GFR, Estimated: >60 mL/min (ref >60)
Glucose, Bld: 128 mg/dL — ABNORMAL HIGH (ref 70–99)
Potassium: 4.1 mmol/L (ref 3.5–5.1)
Sodium: 133 mmol/L — ABNORMAL LOW (ref 135–145)

## 2021-01-15 LAB — CBC
HCT: 28.7 % — ABNORMAL LOW (ref 39.0–52.0)
Hemoglobin: 9.2 g/dL — ABNORMAL LOW (ref 13.0–17.0)
MCH: 31.3 pg (ref 26.0–34.0)
MCHC: 32.1 g/dL (ref 30.0–36.0)
MCV: 97.6 fL (ref 80.0–100.0)
Platelets: 301 10*3/uL (ref 150–400)
RBC: 2.94 MIL/uL — ABNORMAL LOW (ref 4.22–5.81)
RDW: 14.5 % (ref 11.5–15.5)
WBC: 12.2 10*3/uL — ABNORMAL HIGH (ref 4.0–10.5)
nRBC: 0 % (ref 0.0–0.2)

## 2021-01-15 LAB — GLUCOSE, CAPILLARY
Glucose-Capillary: 127 mg/dL — ABNORMAL HIGH (ref 70–99)
Glucose-Capillary: 107 mg/dL — ABNORMAL HIGH (ref 70–99)
Glucose-Capillary: 130 mg/dL — ABNORMAL HIGH (ref 70–99)

## 2021-01-15 LAB — MAGNESIUM: Magnesium: 1.8 mg/dL (ref 1.7–2.4)

## 2021-01-15 MED ORDER — SODIUM CHLORIDE 0.9 % IV SOLN: INTRAVENOUS | Status: AC

## 2021-01-15 NOTE — Progress Notes (Addendum)
[facility] [place]  Subjective  - no new complaints   Objective 103/62 73 98.1 F (36.7 C) (Oral) 20 97%  Intake/Output Summary (Last 24 hours) at 01/15/2021 0726 Last data filed at 01/14/2021 1000 Gross per 24 hour  Intake 120 ml  Output --  Net 120 ml    Left AKA wound without bleeding, no erythema surrounding open wound.  Old packing has greenish discoloration on it. Wet to dry with dakin packed Lungs non labored breathing   Assessment/Planning: Left AKA wound with pseudomonas wet to dry dakin's daily  Cont. Wet to dry dakins dressing changes Leukocytosis WBC 12.2  Roxy Horseman 01/15/2021 7:26 AM --  Laboratory Lab Results: Recent Labs    01/14/21 0317 01/15/21 0132  WBC 10.3 12.2*  HGB 9.3* 9.2*  HCT 29.3* 28.7*  PLT 318 301   BMET Recent Labs    01/14/21 0317 01/15/21 0132  NA 135 133*  K 4.0 4.1  CL 103 103  CO2 24 23  GLUCOSE 135* 128*  BUN 10 10  CREATININE 0.95 1.01  CALCIUM 8.5* 8.5*    COAG Lab Results  Component Value Date   INR 1.5 (H) 01/10/2021   INR 1.7 (H) 12/11/2020   INR 2.2 (H) 12/10/2020   No results found for: PTT  VASCULAR STAFF ADDENDUM: I have independently interviewed and examined the patient. I agree with the above.   Yevonne Aline. Stanford Breed, MD [facility] Phone Number: [phone] 01/16/2021 7:46 AM

## 2021-01-15 NOTE — Progress Notes (Signed)
Patient alert and oriented X 4 stating pain level of 10 on 0-10 pain scale.  Patient given bed bath and repositioned in bed.  Patient falling asleep intermittently during assessment.  States only pain to left lower amputation stump.  No drainage from wound area at this time.  Elevated stump on pillow with clean pillow case.  Will continue to follow prescribed orders.

## 2021-01-15 NOTE — Progress Notes (Signed)
Patient ID: [name], male   DOB: 1951-08-20, 70 y.o.   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  On presentation, he was hypotensive which responded to IV fluids.  Vascular surgery was consulted.  He was started on IV Levaquin empirically.  He was also found to have pain in the right fifth toe for which vascular surgery stated that he is likely not a candidate for extensive revascularization on the right leg given nonambulatory with previous stroke.  Assessment & Plan:   Bleeding from left AKA wound Possible left AKA wound infection -Patient presented with persistent bleeding from left AKA wound.  Vascular surgery following.  Currently on empiric Levaquin for possible Pseudomonas infection as well. -Eliquis on hold since admission.  Hemoglobin 9.2 today. -Follow further vascular surgery recommendations.  Dressing as per vascular surgery recommendations  Leukocytosis -Mild.  Monitor  Permanent A. fib with RVR -Still intermittently tachycardic.  Continue amiodarone, metoprolol.  Eliquis on hold.  History of unspecified CVA -Continue Lipitor.  Resume Eliquis only once cleared by vascular surgery  Left fifth toe dry gangrene -not a candidate for revascularization of left leg as per vascular surgery.  Vascular surgery would only offer him a palliative amputation.  Chronic systolic CHF -Compensated.  EF was 20 to 25% during last admission.  Diuretics on hold.  Continue daily weights, strict  input output and fluid restriction  CAD -Stable.  Continue statin and metoprolol.  Outpatient follow-up with cardiology  Dementia -Continue memantine and donepezil  BPH -Continue finasteride  Generalized deconditioning -Will need SNF placement.  Social worker following -Palliative care consultation for goals of care discussion  DVT prophylaxis: Lovenox Code Status: Full Family Communication: None at bedside Disposition Plan: Status is: Inpatient  Remains inpatient appropriate because:Inpatient level of care appropriate due to severity of illness   Dispo:  Patient From: [place]  Planned Disposition: [place] in 1 to 2 days once cleared by vascular surgery  Medically stable for discharge: No     Consultants: Vascular surgery  Procedures: None  Antimicrobials: Levaquin   Subjective: Patient seen and examined at bedside.  Poor historian.  Pleasantly confused.  No vomiting, fever, worsening agitation reported by nursing staff  Objective: Vitals:   01/14/21 1912 01/14/21 2107 01/14/21 2336 01/15/21 0259  BP: 92/67 (!) 101/55 101/71 103/62  Pulse: 99 69 81 73  Resp:  19 18 20   Temp:  98.1 F (36.7 C) 98 F (36.7 C) 98.1 F (36.7 C)  TempSrc:  Oral Oral Oral  SpO2: 98% 96% 96% 97%  Weight:      Height:        Intake/Output Summary (Last 24 hours) at 01/15/2021 0756 Last data filed at 01/14/2021 1000 Gross per 24 hour  Intake 120 ml  Output --  Net 120 ml   Filed Weights   01/12/21 0342 01/13/21 0345 01/14/21 0405  Weight: 86.5 kg 85.3 kg 85.5 kg    Examination:  General exam: On room air currently.  No distress.  Looks chronically ill.  Pleasantly confused.  Respiratory system: Decreased breath sounds at bases bilaterally with some crackles Cardiovascular system: Intermittently tachycardic, S1-S2 heard  gastrointestinal system: Abdomen is slightly distended, soft and nontender.  Bowel sounds heard  extremities: Right AKA  dressing present; no clubbing Central nervous system: Alert; still very slow to respond.  Poor historian.  No focal neurological deficits.  Moves extremities Skin: No obvious petechiae/other rashes Psychiatry: Affect is flat     Data Reviewed: I have personally reviewed following labs and imaging studies  CBC: Recent Labs  Lab 01/10/21 1640 01/11/21 0211 01/11/21 1909 01/12/21 0244 01/13/21 0728 01/14/21 0317 01/15/21 0132  WBC 12.5*   < > 12.1* 11.8* 11.9* 10.3 12.2*  NEUTROABS 8.5*  --   --   --  7.8*  --   --   HGB 9.0*   < > 8.4* 8.6* 9.2* 9.3* 9.2*  HCT 28.6*   < > 27.1* 26.8* 29.3* 29.3* 28.7*  MCV 100.7*   < > 100.0 97.5 98.3 98.3 97.6  PLT 285   < > 265 238 300 318 301   < > = values in this interval not displayed.   Basic Metabolic Panel: Recent Labs  Lab 01/11/21 1909 01/12/21 0244 01/13/21 0728 01/14/21 0317 01/15/21 0132  NA 134* 135 134* 135 133*  K 4.8 4.4 4.0 4.0 4.1  CL 103 105 102 103 103  CO2 25 23 23 24 23   GLUCOSE 215* 179* 144* 135* 128*  BUN 18 15 12 10 10   CREATININE 1.02 0.88 0.92 0.95 1.01  CALCIUM 8.2* 8.4* 8.7* 8.5* 8.5*  MG  --   --  1.8 1.8 1.8  PHOS  --   --  4.2 4.0  --    GFR: Estimated Creatinine Clearance: 73.2 mL/min (by C-G formula based on SCr of 1.01 mg/dL). Liver Function Tests: Recent Labs  Lab 01/11/21 1909  AST 15  ALT 13  ALKPHOS 79  BILITOT 0.4  PROT 6.2*  ALBUMIN 2.2*   No results for input(s): LIPASE, AMYLASE in the last 168 hours. No results for input(s): AMMONIA in the last 168 hours. Coagulation Profile: Recent Labs  Lab 01/10/21 1640  INR 1.5*   Cardiac Enzymes: No results for input(s): CKTOTAL, CKMB, CKMBINDEX, TROPONINI in the last 168 hours. BNP (last 3 results) No results for input(s): PROBNP in the last 8760 hours. HbA1C: No results for input(s): HGBA1C in the last 72 hours. CBG: Recent Labs  Lab 01/14/21 0804 01/14/21 1214 01/14/21 1553 01/14/21 2109 01/15/21 0625  GLUCAP 128* 147*  120* 112* 127*   Lipid Profile: No results for input(s): CHOL, HDL, LDLCALC, TRIG, CHOLHDL, LDLDIRECT in the last 72 hours. Thyroid Function Tests: Recent Labs    01/13/21 0728  TSH 4.015   Anemia Panel: No results for input(s): VITAMINB12, FOLATE, FERRITIN, TIBC, IRON, RETICCTPCT in the last 72 hours. Sepsis Labs: Recent Labs  Lab 01/12/21 1722  PROCALCITON <0.10    Recent Results (from the past 240 hour(s))  Culture, blood (routine x 2)     Status: None (Preliminary result)   Collection Time: 01/12/21  5:22 PM   Specimen: BLOOD RIGHT HAND  Result Value Ref Range Status   Specimen Description BLOOD RIGHT HAND  Final   Special Requests   Final    BOTTLES DRAWN AEROBIC ONLY Blood Culture results may not be optimal due to an inadequate volume of blood received in culture bottles   Culture   Final    NO GROWTH 2 DAYS Performed at [facility], [place] [address], [place], [place] [address]    Report Status PENDING  Incomplete  Culture, blood (routine x 2)     Status: None (Preliminary result)   Collection Time: 01/12/21  5:25 PM   Specimen: BLOOD RIGHT HAND  Result Value Ref Range Status   Specimen Description BLOOD RIGHT HAND  Final   Special Requests   Final    BOTTLES DRAWN AEROBIC ONLY Blood Culture adequate volume   Culture   Final    NO GROWTH 2 DAYS Performed at [facility], [place] [address] [place], [place] [address]    Report Status PENDING  Incomplete         Radiology Studies: No results found.      Scheduled Meds: . amiodarone  200 mg Oral Daily  . atorvastatin  40 mg Oral QHS  . donepezil  5 mg Oral QHS  . enoxaparin (LOVENOX) injection  40 mg Subcutaneous Q24H  . finasteride  5 mg Oral Q1200  . insulin aspart  0-5 Units Subcutaneous QHS  . insulin aspart  0-9 Units Subcutaneous TID WC  . levofloxacin  750 mg Oral Daily  . memantine  5 mg Oral BID  . metoprolol tartrate  12.5 mg Oral BID  . pantoprazole  40 mg Oral Daily  .  saccharomyces boulardii  250 mg Oral BID  . senna-docusate  2 tablet Oral QHS   Continuous Infusions:         Aline August, MD [facility] 01/15/2021, 7:56 AM

## 2021-01-16 DIAGNOSIS — Z515 Encounter for palliative care: Secondary | ICD-10-CM

## 2021-01-16 DIAGNOSIS — Z7189 Other specified counseling: Secondary | ICD-10-CM

## 2021-01-16 LAB — GLUCOSE, CAPILLARY
Glucose-Capillary: 145 mg/dL — ABNORMAL HIGH (ref 70–99)
Glucose-Capillary: 95 mg/dL (ref 70–99)
Glucose-Capillary: 144 mg/dL — ABNORMAL HIGH (ref 70–99)
Glucose-Capillary: 145 mg/dL — ABNORMAL HIGH (ref 70–99)

## 2021-01-16 LAB — C-REACTIVE PROTEIN: CRP: 7.7 mg/dL — ABNORMAL HIGH (ref <1.0)

## 2021-01-16 LAB — BASIC METABOLIC PANEL
Anion gap: 7 (ref 5–15)
BUN: 10 mg/dL (ref 8–23)
CO2: 24 mmol/L (ref 22–32)
Calcium: 8.2 mg/dL — ABNORMAL LOW (ref 8.9–10.3)
Chloride: 102 mmol/L (ref 98–111)
Creatinine, Ser: 1.01 mg/dL (ref 0.61–1.24)
GFR, Estimated: >60 mL/min (ref >60)
Glucose, Bld: 159 mg/dL — ABNORMAL HIGH (ref 70–99)
Potassium: 4.2 mmol/L (ref 3.5–5.1)
Sodium: 133 mmol/L — ABNORMAL LOW (ref 135–145)

## 2021-01-16 LAB — CBC WITH DIFFERENTIAL/PLATELET
Abs Immature Granulocytes: 0.09 10*3/uL — ABNORMAL HIGH (ref 0.00–0.07)
Basophils Absolute: 0.1 10*3/uL (ref 0.0–0.1)
Basophils Relative: 1 %
Eosinophils Absolute: 0.1 10*3/uL (ref 0.0–0.5)
Eosinophils Relative: 1 %
HCT: 27.9 % — ABNORMAL LOW (ref 39.0–52.0)
Hemoglobin: 8.7 g/dL — ABNORMAL LOW (ref 13.0–17.0)
Immature Granulocytes: 1 %
Lymphocytes Relative: 23 %
Lymphs Abs: 2.4 10*3/uL (ref 0.7–4.0)
MCH: 30.5 pg (ref 26.0–34.0)
MCHC: 31.2 g/dL (ref 30.0–36.0)
MCV: 97.9 fL (ref 80.0–100.0)
Monocytes Absolute: 0.9 10*3/uL (ref 0.1–1.0)
Monocytes Relative: 9 %
Neutro Abs: 7.2 10*3/uL (ref 1.7–7.7)
Neutrophils Relative %: 65 %
Platelets: 330 10*3/uL (ref 150–400)
RBC: 2.85 MIL/uL — ABNORMAL LOW (ref 4.22–5.81)
RDW: 14.5 % (ref 11.5–15.5)
WBC: 10.8 10*3/uL — ABNORMAL HIGH (ref 4.0–10.5)
nRBC: 0 % (ref 0.0–0.2)

## 2021-01-16 LAB — MAGNESIUM: Magnesium: 1.9 mg/dL (ref 1.7–2.4)

## 2021-01-16 LAB — SARS CORONAVIRUS 2 (TAT 6-24 HRS): SARS Coronavirus 2: NEGATIVE

## 2021-01-16 MED ORDER — APIXABAN 5 MG PO TABS
5.0000 mg | ORAL_TABLET | Freq: Two times a day (BID) | ORAL | Status: DC
  Administered 2021-01-16: 5 mg via ORAL
  Filled 2021-01-16: qty 1

## 2021-01-16 NOTE — Progress Notes (Signed)
No change in appearance of L AKA stump wound.  Continue wet-to-dry dressing changes as directed. Follow up with me in clinic in 1-2 weeks. No role for revascularization in RLE. Palliative care may be appropriate. Please call for questions.  [name]. Stanford Breed, MD [facility] Phone Number: [phone] 01/16/2021 7:47 AM

## 2021-01-16 NOTE — TOC Progression Note (Signed)
Transition of Care [facility]) - Progression Note    Patient Details  Name: [name] MRN: [id number] Date of Birth: [birth year]  Transition of Care [facility]) CM/SW [place], [place] Phone Number: 01/16/2021, 10:01 AM  Clinical Narrative:     CSW visit with patient at bedside. Patient is agreeable to returning to [facility].  Insurance authorization started- reference # [id number] MD updated-covid test requested   CSW will continue to follow and assist with discharge planning.  Thurmond Butts, MSW, LCSW Clinical Social Worker   Expected Discharge Plan: Skilled Nursing Facility Barriers to Discharge: Insurance Authorization  Expected Discharge Plan and Services Expected Discharge Plan: [place] In-house Referral: Clinical Social Work     Living arrangements for the past 2 months: [place]                                       Social Determinants of Health (SDOH) Interventions    Readmission Risk Interventions No flowsheet data found.

## 2021-01-16 NOTE — Progress Notes (Signed)
Patient ID: [name], male   DOB: September 30, 1951, 70 y.o.   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  On presentation, he was hypotensive which responded to IV fluids.  Vascular surgery was consulted.  He was started on IV Levaquin empirically.  He was also found to have pain in the right fifth toe for which vascular surgery stated that he is likely not a candidate for extensive revascularization on the right leg given nonambulatory with previous stroke.  Assessment & Plan:   Bleeding from left AKA wound Possible left AKA wound infection -Patient presented with persistent bleeding from left AKA wound.  Vascular surgery following.  Currently on empiric Levaquin for possible Pseudomonas infection as well. -Eliquis on hold since admission.  Hemoglobin 8.7 today. -Follow further vascular surgery recommendations regarding duration of antibiotic treatment and timing of resumption of Eliquis.  Dressing as per vascular surgery recommendations  Leukocytosis -Resolved.  Permanent A. fib with RVR -Still intermittently tachycardic.  Continue amiodarone, metoprolol.  Eliquis on hold.  History of unspecified CVA -Continue Lipitor.  Resume Eliquis only once cleared by vascular surgery  Left fifth toe dry gangrene -not a candidate for revascularization of left leg as per vascular surgery.  Vascular surgery would only offer him a palliative amputation.  Chronic systolic CHF -Compensated.  EF was 20 to 25%  during last admission.  Diuretics on hold; may resume on discharge.  Continue daily weights, strict input output and fluid restriction  Mild hyponatremia -Monitor  CAD -Stable.  Continue statin and metoprolol.  Outpatient follow-up with cardiology  Dementia -Continue memantine and donepezil  BPH -Continue finasteride  Generalized deconditioning -Will need SNF placement.  Social worker following -Palliative care consultation for goals of care discussion  DVT prophylaxis: Lovenox Code Status: Full Family Communication: None at bedside Disposition Plan: Status is: Inpatient  Remains inpatient appropriate because:Inpatient level of care appropriate due to severity of illness   Dispo:  Patient From: [place]  Planned Disposition: [place] tomorrow if remains stable and if cleared by vascular surgery.  Eliquis needs to be restarted as well  medically stable for discharge: No     Consultants: Vascular surgery  Procedures: None  Antimicrobials: Levaquin   Subjective: Patient seen and examined at bedside.  Poor historian.  Still confused pleasantly.  No worsening shortness of breath, abdominal pain or vomiting reported.  Objective: Vitals:   01/15/21 1200 01/15/21 1300 01/15/21 2343 01/16/21 0441  BP: 109/73 117/67 102/65 100/60  Pulse: 96 99 98 93  Resp:  14 16 18   Temp:   98 F (36.7 C) 97.7 F (36.5 C)  TempSrc:   Oral Oral  SpO2: 98%  100% 95%  Weight:    83.9 kg  Height:       No intake or output data in the 24 hours ending 01/16/21 0749 Filed Weights   01/13/21 0345 01/14/21 0405 01/16/21 0441  Weight: 85.3 kg 85.5 kg 83.9 kg    Examination:  General exam: No acute distress.  Currently still on room air.  Looks chronically ill.  Still pleasantly confused. Respiratory system: Bilateral decreased breath sounds at bases with scattered crackles Cardiovascular system: S1-S2 heard, rate controlled  gastrointestinal system:  Abdomen is distended mildly, soft and nontender.  Normal bowel sounds heard  extremities: No cyanosis; right AKA dressing present   Data Reviewed: I have personally reviewed following labs and imaging studies  CBC: Recent Labs  Lab 01/10/21 1640 01/11/21 0211 01/12/21 0244 01/13/21 0728 01/14/21 0317 01/15/21 0132 01/16/21 0219  WBC 12.5*   < > 11.8* 11.9* 10.3 12.2* 10.8*  NEUTROABS 8.5*  --   --  7.8*  --   --  7.2  HGB 9.0*   < > 8.6* 9.2* 9.3* 9.2* 8.7*  HCT 28.6*   < > 26.8* 29.3* 29.3* 28.7* 27.9*  MCV 100.7*   < > 97.5 98.3 98.3 97.6 97.9  PLT 285   < > 238 300 318 301 330   < > = values in this interval not displayed.   Basic Metabolic Panel: Recent Labs  Lab 01/12/21 0244 01/13/21 0728 01/14/21 0317 01/15/21 0132 01/16/21 0219  NA 135 134* 135 133* 133*  K 4.4 4.0 4.0 4.1 4.2  CL 105 102 103 103 102  CO2 23 23 24 23 24   GLUCOSE 179* 144* 135* 128* 159*  BUN 15 12 10 10 10   CREATININE 0.88 0.92 0.95 1.01 1.01  CALCIUM 8.4* 8.7* 8.5* 8.5* 8.2*  MG  --  1.8 1.8 1.8 1.9  PHOS  --  4.2 4.0  --   --    GFR: Estimated Creatinine Clearance: 72.5 mL/min (by C-G formula based on SCr of 1.01 mg/dL). Liver Function Tests: Recent Labs  Lab 01/11/21 1909  AST 15  ALT 13  ALKPHOS 79  BILITOT 0.4  PROT 6.2*  ALBUMIN 2.2*   No results for input(s): LIPASE, AMYLASE in the last 168 hours. No results for input(s): AMMONIA in the last 168 hours. Coagulation Profile: Recent Labs  Lab 01/10/21 1640  INR 1.5*   Cardiac Enzymes: No results for input(s): CKTOTAL, CKMB, CKMBINDEX, TROPONINI in the last 168 hours. BNP (last 3 results) No results for input(s): PROBNP in the last 8760 hours. HbA1C: No results for input(s): HGBA1C in the last 72 hours. CBG: Recent Labs  Lab 01/14/21 2109 01/15/21 0625 01/15/21 1116 01/15/21 2204 01/16/21 0629  GLUCAP 112* 127* 107* 130* 145*   Lipid Profile: No results for input(s): CHOL, HDL, LDLCALC, TRIG, CHOLHDL,  LDLDIRECT in the last 72 hours. Thyroid Function Tests: No results for input(s): TSH, T4TOTAL, FREET4, T3FREE, THYROIDAB in the last 72 hours. Anemia Panel: No results for input(s): VITAMINB12, FOLATE, FERRITIN, TIBC, IRON, RETICCTPCT in the last 72 hours. Sepsis Labs: Recent Labs  Lab 01/12/21 1722  PROCALCITON <0.10    Recent Results (from the past 240 hour(s))  Culture, blood (routine x 2)     Status: None (Preliminary result)   Collection Time: 01/12/21  5:22 PM   Specimen: BLOOD RIGHT HAND  Result Value Ref Range Status   Specimen Description BLOOD RIGHT HAND  Final   Special Requests   Final    BOTTLES DRAWN AEROBIC ONLY Blood Culture results may not be optimal due to an inadequate volume of blood received in culture bottles   Culture   Final    NO GROWTH 3 DAYS Performed at [facility], [place] [address] [place], [place] [address]    Report Status PENDING  Incomplete  Culture, blood (routine x 2)  Status: None (Preliminary result)   Collection Time: 01/12/21  5:25 PM   Specimen: BLOOD RIGHT HAND  Result Value Ref Range Status   Specimen Description BLOOD RIGHT HAND  Final   Special Requests   Final    BOTTLES DRAWN AEROBIC ONLY Blood Culture adequate volume   Culture   Final    NO GROWTH 3 DAYS Performed at [facility], [address], [place], [place] [address]    Report Status PENDING  Incomplete         Radiology Studies: No results found.      Scheduled Meds: . amiodarone  200 mg Oral Daily  . atorvastatin  40 mg Oral QHS  . donepezil  5 mg Oral QHS  . enoxaparin (LOVENOX) injection  40 mg Subcutaneous Q24H  . finasteride  5 mg Oral Q1200  . insulin aspart  0-5 Units Subcutaneous QHS  . insulin aspart  0-9 Units Subcutaneous TID WC  . levofloxacin  750 mg Oral Daily  . memantine  5 mg Oral BID  . metoprolol tartrate  12.5 mg Oral BID  . pantoprazole  40 mg Oral Daily  . saccharomyces boulardii  250 mg Oral BID  . senna-docusate   2 tablet Oral QHS   Continuous Infusions: . sodium chloride 75 mL/hr at 01/15/21 1102          Jaunita Mikels, MD [facility] 01/16/2021, 7:49 AM

## 2021-01-16 NOTE — Progress Notes (Signed)
Occupational Therapy Treatment Patient Details Name: [name] MRN: [id number] DOB: 21-Aug-1951 Today's Date: 01/16/2021    History of present illness Pt is a 70 y.o. male admitted from SNF on 01/10/21 with bleeding from L AKA wound. Per vascular, L AKA drainage concern for pseudomonas; plan for conservative management. Pt with RLE vascular changes, not a candidate for extensive revascularization. PMH includes afib, HTN, CAD, ischemic cardiomyopathy (EG 20-25%), DM, PAD (s/p L AKA 11/11/20), SDH, CVA, dementia; of note, 2x recent admissions since L AKA for septic shock secondary to PNA requiring initial ETT with d/c to SNF.   OT comments  Patient making incremental gains to patient stated goals this date.  See eval for prior level of function and supports available.  Patient able to sit EOB with Mod A to bring trunk up and scoot to edge of the bed.  He was able to sit EOB with supervision for light grooming.  Patient with no dizziness expressed and VSS on RA.  Patient squat pivot to recliner with Mod A.  Setup as needed for lunch tray.  OT to continue in the acute setting to maximize his functional status for return to SNF for continued rehab.    Follow Up Recommendations  SNF;Supervision/Assistance - 24 hour    Equipment Recommendations       Recommendations for Other Services      Precautions / Restrictions Precautions Precautions: Fall Precaution Comments: H/o L AKA; bladder/bowel incontinence; LUE hemiparesis Restrictions Weight Bearing Restrictions: No       Mobility Bed Mobility Overal bed mobility: Needs Assistance Bed Mobility: Supine to Sit     Supine to sit: Mod assist          Transfers Overall transfer level: Needs assistance   Transfers: Squat Pivot Transfers Sit to Stand: Mod assist              Balance Overall balance assessment: Needs assistance Sitting-balance support: Feet supported;Single extremity supported Sitting balance-Leahy Scale: Fair      Standing balance support: Single extremity supported Standing balance-Leahy Scale: Poor Standing balance comment: needing external support                           ADL either performed or assessed with clinical judgement   ADL   Eating/Feeding: Sitting;Set up   Grooming: Sitting;Minimal assistance                                       Vision Patient Visual Report: No change from baseline                Cognition Arousal/Alertness: Awake/alert Behavior During Therapy: WFL for tasks assessed/performed Overall Cognitive Status: History of cognitive impairments - at baseline                                                            Pertinent Vitals/ Pain       Faces Pain Scale: Hurts a little bit Pain Location: L AKA Pain Descriptors / Indicators: Tender Pain Intervention(s): Monitored during session  Frequency  Min 2X/week        Progress Toward Goals  OT Goals(current goals can now be found in the care plan section)     Acute Rehab OT Goals Patient Stated Goal: to go back to rehab OT Goal Formulation: With patient Time For Goal Achievement: 01/26/21 Potential to Achieve Goals: Good  Plan Discharge plan remains appropriate    Co-evaluation                 AM-PAC OT "6 Clicks" Daily Activity     Outcome Measure   Help from another person eating meals?: A Little Help from another person taking care of personal grooming?: A Lot Help from another person toileting, which includes using toliet, bedpan, or urinal?: A Lot Help from another person bathing (including washing, rinsing, drying)?: A Lot Help from another person to put on and taking off regular upper body clothing?: A Lot Help from another person to put on and taking off regular lower body clothing?: A Lot 6 Click Score: 13    End of Session Equipment  Utilized During Treatment: Gait belt  OT Visit Diagnosis: Unsteadiness on feet (R26.81);Other abnormalities of gait and mobility (R26.89);Muscle weakness (generalized) (M62.81);Pain Pain - Right/Left: Left Pain - part of body: Leg   Activity Tolerance Patient tolerated treatment well   Patient Left in chair;with call bell/phone within reach;Other (comment) (no chair alarm unit in room.  nursing notified)   Nurse Communication Mobility status        Time: 2549-8264 OT Time Calculation (min): 17 min  Charges: OT General Charges $OT Visit: 1 Visit OT Treatments $Therapeutic Activity: 8-22 mins  01/16/2021  Rich, OTR/L  Acute Rehabilitation Services  Office:  [phone]    Metta Clines 01/16/2021, 12:24 PM
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
## 2021-01-16 NOTE — Progress Notes (Signed)
Mobility Specialist: Progress Note   01/16/21 1405  Mobility  Activity Transferred:  Chair to bed  Level of Assistance +2 (takes two people)  Mobility Response Tolerated poorly  Mobility performed by Mobility specialist  $Mobility charge 1 Mobility   Assisted NT and nursing student in transferring pt back to bed from recliner. Pt was unable to stand and pivot to bed so pt was picked up and pivoted to the bed with help from staff. Pt back in bed with NT and nursing student in room.   [facility] Angellynn Kimberlin Mobility Specialist Mobility Specialist Phone: [phone]

## 2021-01-16 NOTE — TOC Initial Note (Signed)
Transition of Care [facility]) - Initial/Assessment Note    Patient Details  Name: [name] MRN: [id number] Date of Birth: 31-Dec-1950  Transition of Care [facility]) CM/SW Contact:    Vinie Sill, LCSW Phone Number: 01/16/2021, 9:46 AM  Clinical Narrative:                  CSW spoke with patient's spouse,Wanda, by phone. CSW introduced self and explained role. Patient's wife confirmed patient is from the [facility] and is agreeable to patient returning.   CSW contacted [facility]- confirmed bed offer  Patient will need insurance authorization and covid test before discharge to SNF.  CSW will continue to follow and assist with discharge planning.  Thurmond Butts, MSW, LCSW Clinical Social Worker    Expected Discharge Plan: Skilled Nursing Facility Barriers to Discharge: Insurance Authorization   Patient Goals and CMS Choice Patient states their goals for this hospitalization and ongoing recovery are:: to go to SNF CMS Medicare.gov Compare Post Acute Care list provided to:: Patient Choice offered to / list presented to : Patient  Expected Discharge Plan and Services Expected Discharge Plan: [place] In-house Referral: Clinical Social Work     Living arrangements for the past 2 months: [facility]                                      Prior Living Arrangements/Services Living arrangements for the past 2 months: [place] Lives with:: Self Patient language and need for interpreter reviewed:: No Do you feel safe going back to the place where you live?: No   SNF  Need for Family Participation in Patient Care: Yes (Comment) Care giver support system in place?: Yes (comment)   Criminal Activity/Legal Involvement Pertinent to Current Situation/Hospitalization: No - Comment as needed  Activities of Daily Living Home Assistive Devices/Equipment: None ADL Screening (condition at time of admission) Patient's  cognitive ability adequate to safely complete daily activities?: Yes Is the patient deaf or have difficulty hearing?: No Does the patient have difficulty seeing, even when wearing glasses/contacts?: No Does the patient have difficulty concentrating, remembering, or making decisions?: No Patient able to express need for assistance with ADLs?: Yes Does the patient have difficulty dressing or bathing?: Yes Independently performs ADLs?: No Communication: Independent Dressing (OT): Needs assistance Does the patient have difficulty walking or climbing stairs?: Yes Weakness of Legs: Left Weakness of Arms/Hands: None (bka)  Permission Sought/Granted Permission sought to share information with : Family Supports Permission granted to share information with : Yes, Verbal Permission Granted  Share Information with NAME: Benn Tarver  Permission granted to share info w AGENCY: SNFs  Permission granted to share info w Relationship: spouse  Permission granted to share info w Contact Information: [phone]  Emotional Assessment Appearance:: Appears stated age Attitude/Demeanor/Rapport: Gracious Affect (typically observed): Calm Orientation: : Oriented to Self,Oriented to Place,Oriented to  Time,Oriented to Situation Alcohol / Substance Use: Not Applicable Psych Involvement: No (comment)  Admission diagnosis:  Surgical wound dehiscence [T81.31XA] Bleeding from wound [T14.8XXA] Patient Active Problem List   Diagnosis Date Noted  . Surgical wound dehiscence 01/10/2021  . Atrial fibrillation, chronic ([place]) 01/10/2021  . Pressure injury of skin 12/11/2020  . Septic shock ([place])   . Acute respiratory failure with hypoxia ([place])   . Multifocal pneumonia   . Palliative care by specialist   . Goals of care, counseling/discussion   .  Osteomyelitis of left foot ([place])   . Cardiomyopathy ([place])   . PAD (peripheral artery disease) /SMA Stenosis/Rt and Lt SFA Stenosis/ 10/28/2020  . SIRS (systemic  inflammatory response syndrome) ([place]) 10/26/2020  . Sepsis --due to Infected Lt Foot/Ostepmyleitis---POA 10/26/2020  . Acute osteomyelitis of toe, left ([place]) 10/25/2020  . Diabetic ulcer of left heel ([place]) 10/25/2020  . Fracture of left toe 10/25/2020  . Leukocytosis 10/25/2020  . Hypokalemia 10/25/2020  . Hypoalbuminemia 10/25/2020  . Hyperglycemia due to diabetes mellitus ([place]) 10/25/2020  . History of stroke 10/25/2020  . Abdominal pain, epigastric 02/09/2017  . Morbid obesity ([place]) 09/17/2016  . Sedentary lifestyle 11/20/2015  . Type 2 diabetes mellitus with vascular disease ([place]) 07/25/2015  . Bilateral carotid artery disease ([place]) 05/01/2015  . Atherosclerosis of artery of extremity with ulceration ([place]) 04/08/2015  . Lower extremity edema 02/11/2015  . Tobacco use disorder 07/16/2014  . Chest pain 06/30/2014  . OSA on CPAP 06/30/2014  . Aftercare following surgery of the circulatory system, [place] 12/27/2013  . Chronic pain syndrome 11/03/2013  . Contracture of muscle of left upper arm 10/10/2013  . Foot fracture 10/10/2013  . Generalized OA 10/10/2013  . Anemia, iron deficiency 09/26/2013  . Insomnia 09/26/2013  . Stasis dermatitis 07/31/2013  . Peripheral autonomic neuropathy due to diabetes mellitus ([place]) 07/31/2013  . RLS (restless legs syndrome) 07/31/2013  . Allergic rhinitis 07/28/2013  . CAD, multiple vessel, 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 06/21/2013  . Essential hypertension 06/21/2013  . GERD (gastroesophageal reflux disease) 06/21/2013  . BPH (benign prostatic hyperplasia) 06/21/2013  . Cardiomyopathy, ischemic 02/22/2013  . Mixed hyperlipidemia 02/22/2013  . Atrial flutter ([place]) 01/02/2013  . Acute myocardial infarction, subendocardial infarction, initial episode of care ([place]) 01/02/2013  . Occlusion and stenosis of carotid artery without mention of cerebral infarction 10/27/2012  . CVA (cerebral infarction) 06/03/2012   PCP:  Caprice Renshaw, MD Pharmacy:   [place], [place] [address] Phone: [phone] Fax: [phone]     Social Determinants of Health (SDOH) Interventions    Readmission Risk Interventions No flowsheet data found.

## 2021-01-17 LAB — CBC WITH DIFFERENTIAL/PLATELET
Abs Immature Granulocytes: 0.09 10*3/uL — ABNORMAL HIGH (ref 0.00–0.07)
Basophils Absolute: 0.1 10*3/uL (ref 0.0–0.1)
Basophils Relative: 1 %
Eosinophils Absolute: 0.2 10*3/uL (ref 0.0–0.5)
Eosinophils Relative: 2 %
HCT: 25.5 % — ABNORMAL LOW (ref 39.0–52.0)
Hemoglobin: 8.2 g/dL — ABNORMAL LOW (ref 13.0–17.0)
Immature Granulocytes: 1 %
Lymphocytes Relative: 25 %
Lymphs Abs: 2.3 10*3/uL (ref 0.7–4.0)
MCH: 31.5 pg (ref 26.0–34.0)
MCHC: 32.2 g/dL (ref 30.0–36.0)
MCV: 98.1 fL (ref 80.0–100.0)
Monocytes Absolute: 0.9 10*3/uL (ref 0.1–1.0)
Monocytes Relative: 10 %
Neutro Abs: 5.6 10*3/uL (ref 1.7–7.7)
Neutrophils Relative %: 61 %
Platelets: 289 10*3/uL (ref 150–400)
RBC: 2.6 MIL/uL — ABNORMAL LOW (ref 4.22–5.81)
RDW: 14.6 % (ref 11.5–15.5)
WBC: 9.2 10*3/uL (ref 4.0–10.5)
nRBC: 0 % (ref 0.0–0.2)

## 2021-01-17 LAB — CULTURE, BLOOD (ROUTINE X 2)
Culture: NO GROWTH
Culture: NO GROWTH
Special Requests: ADEQUATE

## 2021-01-17 LAB — BASIC METABOLIC PANEL
Anion gap: 6 (ref 5–15)
BUN: 8 mg/dL (ref 8–23)
CO2: 23 mmol/L (ref 22–32)
Calcium: 8 mg/dL — ABNORMAL LOW (ref 8.9–10.3)
Chloride: 107 mmol/L (ref 98–111)
Creatinine, Ser: 0.95 mg/dL (ref 0.61–1.24)
GFR, Estimated: >60 mL/min (ref >60)
Glucose, Bld: 122 mg/dL — ABNORMAL HIGH (ref 70–99)
Potassium: 3.7 mmol/L (ref 3.5–5.1)
Sodium: 136 mmol/L (ref 135–145)

## 2021-01-17 LAB — GLUCOSE, CAPILLARY
Glucose-Capillary: 142 mg/dL — ABNORMAL HIGH (ref 70–99)
Glucose-Capillary: 159 mg/dL — ABNORMAL HIGH (ref 70–99)

## 2021-01-17 LAB — MAGNESIUM: Magnesium: 1.7 mg/dL (ref 1.7–2.4)

## 2021-01-17 MED ORDER — TRAMADOL HCL 50 MG PO TABS: 50.0000 mg | ORAL_TABLET | Freq: Four times a day (QID) | ORAL | 0 refills | Status: AC | PRN

## 2021-01-17 MED ORDER — METOPROLOL TARTRATE 25 MG PO TABS: 12.5000 mg | ORAL_TABLET | Freq: Two times a day (BID) | ORAL | 0 refills | Status: DC

## 2021-01-17 MED ORDER — INSULIN ASPART 100 UNIT/ML ~~LOC~~ SOLN: 0.0000 [IU] | SUBCUTANEOUS | Status: DC

## 2021-01-17 NOTE — Discharge Summary (Signed)
Physician Discharge Summary  [name]:[id number] DOB: 01-27-1951 DOA: 01/10/2021  PCP: Caprice Renshaw, MD  Admit date: 01/10/2021 Discharge date: 01/17/2021  Admitted From: SNF Disposition: SNF  Recommendations for Outpatient Follow-up:  1. Follow up with PCP in 1 week with repeat CBC/BMP 2. Outpatient follow-up with vascular surgery.  Wound care as per vascular surgery recommendations 3. Outpatient follow-up with cardiology 4. Recommend outpatient follow-up with palliative care 5. 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  On presentation, he was hypotensive which responded to IV fluids.  Vascular surgery was consulted.  He was started on IV Levaquin empirically.  He was also found to have pain in the right fifth toe for which vascular surgery stated that he is likely not a candidate for extensive revascularization on the right leg given nonambulatory with previous stroke.  During the hospitalization, his condition has improved and hemoglobin has stabilized.  Vascular surgery has signed off and recommend no further antibiotic treatment and they have cleared the patient to be restarted on Eliquis.  He will be restarted on Eliquis on discharge.  Discharge patient to SNF today if bed is available.  Outpatient follow-up with vascular surgery.  Discharge Diagnoses:   Bleeding from left  AKA wound Possible left AKA wound infection -Patient presented with persistent bleeding from left AKA wound.    Followed the patient during his hospitalization.    Patient was treated with empiric Levaquin for possible Pseudomonas infection as well. -Eliquis on hold since admission.  Hemoglobin 8.2 today. -Vascular surgery has signed off and recommend no further antibiotic treatment and they have cleared the patient to be restarted on Eliquis.  Outpatient follow-up with vascular surgery and wound care as per vascular surgery recommendations.  Leukocytosis -Resolved.  Permanent A. fib with RVR -Still intermittently tachycardic.  Continue amiodarone, metoprolol.    Resume Eliquis and discitis.  Outpatient follow-up with cardiology.  History of unspecified CVA -Continue Lipitor.  Resume Eliquis  Left fifth toe dry gangrene -not a candidate for revascularization of left leg as per vascular surgery.  Vascular surgery would only offer him a palliative amputation.  Patient/family not decided yet.  Chronic systolic CHF -Compensated.  EF was 20 to 25% during last admission.  Diuretics on hold; will resume on discharge.  Continue diet and fluid restriction.  Mild hyponatremia -Improved  CAD -Stable.  Continue statin and metoprolol.  Outpatient follow-up with cardiology  Dementia -Continue memantine and donepezil  BPH -Continue finasteride  Generalized deconditioning -Palliative care consultation appreciated: Patient remains full code.  Recommend outpatient follow-up with palliative care  Discharge Instructions  Discharge Instructions    Amb Referral to Palliative Care   Complete by: As directed    Goals of care   Ambulatory referral to Cardiology   Complete by: As directed    Followup for heart failure   Diet - low sodium heart healthy   Complete by: As directed    Discharge wound care:   Complete by: As directed  As per vascular surgery recommendations   Increase  activity slowly   Complete by: As directed      Allergies as of 01/17/2021      Reactions   Flexeril [cyclobenzaprine] Shortness Of Breath   Tamsulosin Hcl Swelling, Rash, Other (See Comments)   Swelling around the mouth with rash on face Swelling around the mouth with rash on face Swelling around the mouth with rash on face Swelling around the mouth with rash on face   Tramadol Other (See Comments)   Headache   Robaxin [methocarbamol] Rash   Aspirin    On plavix   Morphine And Related Itching      Medication List    STOP taking these medications   cephALEXin 500 MG capsule Commonly known as: KEFLEX   Ensure Max Protein Liqd   hydrOXYzine 25 MG tablet Commonly known as: ATARAX/VISTARIL   losartan 25 MG tablet Commonly known as: COZAAR   spironolactone 25 MG tablet Commonly known as: ALDACTONE     TAKE these medications   acetaminophen 325 MG tablet Commonly known as: Tylenol Take 2 tablets (650 mg total) by mouth every 6 (six) hours as needed for mild pain or moderate pain.   amiodarone 200 MG tablet Commonly known as: PACERONE Take 1 tablet (200 mg total) by mouth daily.   apixaban 5 MG Tabs tablet Commonly known as: ELIQUIS Take 1 tablet (5 mg total) by mouth 2 (two) times daily.   atorvastatin 40 MG tablet Commonly known as: LIPITOR Take 1 tablet (40 mg total) by mouth at bedtime.   donepezil 5 MG tablet Commonly known as: ARICEPT Take 1 tablet (5 mg total) by mouth at bedtime.   finasteride 5 MG tablet Commonly known as: PROSCAR Take 5 mg by mouth daily at 12 noon.   furosemide 40 MG tablet Commonly known as: LASIX Take 0.5 tablets (20 mg total) by mouth daily at 12 noon. What changed: when to take this   insulin aspart 100 UNIT/ML injection Commonly known as: novoLOG Inject 0-20 Units into the skin See admin instructions. Per sliding scale tid  0-120=0 units, 121-150=3 units, 151-200=4 units, 201-250= 7 units, 351-300= 11 units, 301-350= 15  units, 351-400= 20 units   magnesium oxide 400 MG tablet Commonly known as: MAG-OX Take 400 mg by mouth daily.   memantine 5 MG tablet Commonly known as: NAMENDA Take 1 tablet (5 mg total) by mouth 2 (two) times daily.   metoprolol tartrate 25 MG tablet Commonly known as: LOPRESSOR Take 0.5 tablets (12.5 mg total) by mouth 2 (two) times daily. What changed:   medication strength  how much to take   pantoprazole 40 MG tablet Commonly known as: PROTONIX Take 1 tablet (40 mg total) by mouth daily.   povidone-Iodine 5 % Soln topical solution Commonly known as: BETADINE Apply 1 application topically See admin instructions. Paint 5th toe with betadine qd for gangrene   pregabalin 25 MG capsule Commonly known as: LYRICA Take 1 capsule (25 mg total) by mouth daily.   PROTEIN PO Take 30 mLs by mouth See admin instructions. Protein liquid  bid for wound healing   Resource ThickenUp Clear Powd Nectar thick with all liquids   traMADol 50 MG tablet Commonly known as: ULTRAM Take 1 tablet (50 mg total) by mouth every 6 (six) hours as needed for moderate pain or severe pain.            Discharge Care Instructions  (From admission, onward)  Start     Ordered   01/17/21 0000  Discharge wound care:       Comments: As per vascular surgery recommendations   01/17/21 1000          Follow-up Information    Caprice Renshaw, MD. Schedule an appointment as soon as possible for a visit in 1 week(s).   Specialty: Internal Medicine Why: with repeat cbc/bmp Contact information: [place] [address] [phone]        Lelon Perla, MD .   Specialty: Cardiology Contact information: [address] [place] [address] [phone]        Cherre Robins, MD. Schedule an appointment as soon as possible for a visit in 1 week(s).   Specialties: Vascular Surgery, Interventional Cardiology Contact information: [address] [place]  [place] [address] [phone]              Allergies  Allergen Reactions  . Flexeril [Cyclobenzaprine] Shortness Of Breath  . Tamsulosin Hcl Swelling, Rash and Other (See Comments)    Swelling around the mouth with rash on face Swelling around the mouth with rash on face Swelling around the mouth with rash on face  Swelling around the mouth with rash on face  . Tramadol Other (See Comments)    Headache  . Robaxin [Methocarbamol] Rash  . Aspirin     On plavix  . Morphine And Related Itching    Consultations:  Vascular surgery/palliative care   Procedures/Studies:  No results found.    Subjective: Patient seen and examined at bedside.  Poor historian.  Denies fever, vomiting.  No stump bleeding reported.  Discharge Exam: Vitals:   01/17/21 0324 01/17/21 0747  BP: 128/78 107/81  Pulse: 97 99  Resp: 20 17  Temp: 98 F (36.7 C) 97.6 F (36.4 C)  SpO2: 100% 96%    General: Pt is awake, chronically ill looking and deconditioned.  Poor historian. Cardiovascular: rate controlled, S1/S2 + Respiratory: bilateral decreased breath sounds at bases with scattered crackles Abdominal: Soft, NT, ND, bowel sounds + Extremities: Right AKA dressing present; no cyanosis.  Dry gangrene of the right fifth toe    The results of significant diagnostics from this hospitalization (including imaging, microbiology, ancillary and laboratory) are listed below for reference.     Microbiology: Recent Results (from the past 240 hour(s))  Culture, blood (routine x 2)     Status: None   Collection Time: 01/12/21  5:22 PM   Specimen: BLOOD RIGHT HAND  Result Value Ref Range Status   Specimen Description BLOOD RIGHT HAND  Final   Special Requests   Final    BOTTLES DRAWN AEROBIC ONLY Blood Culture results may not be optimal due to an inadequate volume of blood received in culture bottles   Culture   Final    NO GROWTH 5 DAYS Performed at [facility], [place] [address],  [place], [place] [address]    Report Status 01/17/2021 FINAL  Final  Culture, blood (routine x 2)     Status: None   Collection Time: 01/12/21  5:25 PM   Specimen: BLOOD RIGHT HAND  Result Value Ref Range Status   Specimen Description BLOOD RIGHT HAND  Final   Special Requests   Final    BOTTLES DRAWN AEROBIC ONLY Blood Culture adequate volume   Culture   Final    NO GROWTH 5 DAYS Performed at [facility], [place] [address], [place], [place] [address]  Report Status 01/17/2021 FINAL  Final  SARS CORONAVIRUS 2 (TAT 6-24 HRS) Nasopharyngeal Nasopharyngeal Swab     Status: None   Collection Time: 01/16/21  9:53 AM   Specimen: Nasopharyngeal Swab  Result Value Ref Range Status   SARS Coronavirus 2 NEGATIVE NEGATIVE Final    Comment: (NOTE) SARS-CoV-2 target nucleic acids are NOT DETECTED.  The SARS-CoV-2 RNA is generally detectable in upper and lower respiratory specimens during the acute phase of infection. Negative results do not preclude SARS-CoV-2 infection, do not rule out co-infections with other pathogens, and should not be used as the sole basis for treatment or other patient management decisions. Negative results must be combined with clinical observations, patient history, and epidemiological information. The expected result is Negative.  Fact Sheet for Patients: SugarRoll.be  Fact Sheet for Healthcare Providers: https://www.woods-mathews.com/  This test is not yet approved or cleared by the [place] FDA and  has been authorized for detection and/or diagnosis of SARS-CoV-2 by FDA under an Emergency Use Authorization (EUA). This EUA will remain  in effect (meaning this test can be used) for the duration of the COVID-19 declaration under Se ction 564(b)(1) of the Act, 21 U.S.C. section 360bbb-3(b)(1), unless the authorization is terminated or revoked sooner.  Performed at [facility], [place] [address],  [place], [place] [address]      Labs: BNP (last 3 results) Recent Labs    12/16/20 0131 12/17/20 0217 12/18/20 0142  BNP 1,385.6* 1,317.3* 1,324.4*   Basic Metabolic Panel: Recent Labs  Lab 01/13/21 0728 01/14/21 0317 01/15/21 0132 01/16/21 0219 01/17/21 0217  NA 134* 135 133* 133* 136  K 4.0 4.0 4.1 4.2 3.7  CL 102 103 103 102 107  CO2 23 24 23 24 23   GLUCOSE 144* 135* 128* 159* 122*  BUN 12 10 10 10 8   CREATININE 0.92 0.95 1.01 1.01 0.95  CALCIUM 8.7* 8.5* 8.5* 8.2* 8.0*  MG 1.8 1.8 1.8 1.9 1.7  PHOS 4.2 4.0  --   --   --    Liver Function Tests: Recent Labs  Lab 01/11/21 1909  AST 15  ALT 13  ALKPHOS 79  BILITOT 0.4  PROT 6.2*  ALBUMIN 2.2*   No results for input(s): LIPASE, AMYLASE in the last 168 hours. No results for input(s): AMMONIA in the last 168 hours. CBC: Recent Labs  Lab 01/10/21 1640 01/11/21 0211 01/13/21 0728 01/14/21 0317 01/15/21 0132 01/16/21 0219 01/17/21 0217  WBC 12.5*   < > 11.9* 10.3 12.2* 10.8* 9.2  NEUTROABS 8.5*  --  7.8*  --   --  7.2 5.6  HGB 9.0*   < > 9.2* 9.3* 9.2* 8.7* 8.2*  HCT 28.6*   < > 29.3* 29.3* 28.7* 27.9* 25.5*  MCV 100.7*   < > 98.3 98.3 97.6 97.9 98.1  PLT 285   < > 300 318 301 330 289   < > = values in this interval not displayed.   Cardiac Enzymes: No results for input(s): CKTOTAL, CKMB, CKMBINDEX, TROPONINI in the last 168 hours. BNP: Invalid input(s): POCBNP CBG: Recent Labs  Lab 01/16/21 0629 01/16/21 1152 01/16/21 1635 01/16/21 2127 01/17/21 0610  GLUCAP 145* 95 144* 145* 142*   D-Dimer No results for input(s): DDIMER in the last 72 hours. Hgb A1c No results for input(s): HGBA1C in the last 72 hours. Lipid Profile No results for input(s): CHOL, HDL, LDLCALC, TRIG, CHOLHDL, LDLDIRECT in the last 72 hours. Thyroid function studies No results for input(s): TSH,  T4TOTAL, T3FREE, THYROIDAB in the last 72 hours.  Invalid input(s): FREET3 Anemia work up No results for input(s): VITAMINB12,  FOLATE, FERRITIN, TIBC, IRON, RETICCTPCT in the last 72 hours. Urinalysis    Component Value Date/Time   COLORURINE YELLOW 12/09/2020 1350   APPEARANCEUR CLEAR 12/09/2020 1350   LABSPEC 1.019 12/09/2020 1350   PHURINE 5.0 12/09/2020 1350   GLUCOSEU NEGATIVE 12/09/2020 1350   HGBUR NEGATIVE 12/09/2020 1350   BILIRUBINUR NEGATIVE 12/09/2020 1350   KETONESUR NEGATIVE 12/09/2020 1350   PROTEINUR NEGATIVE 12/09/2020 1350   UROBILINOGEN 0.2 08/20/2014 2042   NITRITE NEGATIVE 12/09/2020 1350   LEUKOCYTESUR NEGATIVE 12/09/2020 1350   Sepsis Labs Invalid input(s): PROCALCITONIN,  WBC,  LACTICIDVEN Microbiology Recent Results (from the past 240 hour(s))  Culture, blood (routine x 2)     Status: None   Collection Time: 01/12/21  5:22 PM   Specimen: BLOOD RIGHT HAND  Result Value Ref Range Status   Specimen Description BLOOD RIGHT HAND  Final   Special Requests   Final    BOTTLES DRAWN AEROBIC ONLY Blood Culture results may not be optimal due to an inadequate volume of blood received in culture bottles   Culture   Final    NO GROWTH 5 DAYS Performed at [facility], [place] [address] [place], [place] [address]    Report Status 01/17/2021 FINAL  Final  Culture, blood (routine x 2)     Status: None   Collection Time: 01/12/21  5:25 PM   Specimen: BLOOD RIGHT HAND  Result Value Ref Range Status   Specimen Description BLOOD RIGHT HAND  Final   Special Requests   Final    BOTTLES DRAWN AEROBIC ONLY Blood Culture adequate volume   Culture   Final    NO GROWTH 5 DAYS Performed at [facility], [place] [address] [place], [place] [address]    Report Status 01/17/2021 FINAL  Final  SARS CORONAVIRUS 2 (TAT 6-24 HRS) Nasopharyngeal Nasopharyngeal Swab     Status: None   Collection Time: 01/16/21  9:53 AM   Specimen: Nasopharyngeal Swab  Result Value Ref Range Status   SARS Coronavirus 2 NEGATIVE NEGATIVE Final    Comment: (NOTE) SARS-CoV-2 target nucleic acids are NOT  DETECTED.  The SARS-CoV-2 RNA is generally detectable in upper and lower respiratory specimens during the acute phase of infection. Negative results do not preclude SARS-CoV-2 infection, do not rule out co-infections with other pathogens, and should not be used as the sole basis for treatment or other patient management decisions. Negative results must be combined with clinical observations, patient history, and epidemiological information. The expected result is Negative.  Fact Sheet for Patients: SugarRoll.be  Fact Sheet for Healthcare Providers: https://www.woods-mathews.com/  This test is not yet approved or cleared by the [place] FDA and  has been authorized for detection and/or diagnosis of SARS-CoV-2 by FDA under an Emergency Use Authorization (EUA). This EUA will remain  in effect (meaning this test can be used) for the duration of the COVID-19 declaration under Se ction 564(b)(1) of the Act, 21 U.S.C. section 360bbb-3(b)(1), unless the authorization is terminated or revoked sooner.  Performed at [facility], [place] [address] [place], [place] [address]      Time coordinating discharge: 35 minutes  SIGNED:   Aline August, MD  [facility] 01/17/2021, 10:38 AM

## 2021-01-17 NOTE — Progress Notes (Signed)
[facility]) Hospital Liaison note:  This is a pending outpatient-based Palliative Care patient. Will continue to follow for disposition.  Please call with any outpatient palliative questions or concerns.  Thank you, Lorelee Market, LPN [facility] Liaison [phone]

## 2021-01-17 NOTE — Progress Notes (Signed)
[facility] discharged [facility] via [place], report called to [place].  Script and discharge instructions given to patient.

## 2021-01-17 NOTE — TOC Transition Note (Signed)
Transition of Care [facility]) - CM/SW Discharge Note   Patient Details  Name: [name] MRN: [id number] Date of Birth: 06-27-1951  [facility] of [facility]) CM/SW Contact:  Vinie Sill, LCSW Phone Number: 01/17/2021, 11:20 AM   Clinical Narrative:     Patient will Discharge to: [facility] Discharge Date:01/17/21 Family Notified:Wanda,spouse  Transport By: Corey Harold  Per MD patient is ready for discharge. RN, patient, and facility notified of discharge. Discharge Summary sent to facility. RN given number for [phone]. Ambulance transport requested for patient.   Clinical Social Worker signing off.  Thurmond Butts, MSW, LCSW Clinical Social Worker   Final next level of care: Skilled Nursing Facility Barriers to Discharge: Barriers Resolved   Patient Goals and CMS Choice Patient states their goals for this hospitalization and ongoing recovery are:: to go to SNF CMS Medicare.gov Compare Post Acute Care list provided to:: Patient Choice offered to / list presented to : Patient  Discharge Placement              Patient chooses bed at: [facility] Patient to be transferred to facility by: [facility] Name of family member notified: Wanda,spouse Patient and family notified of of transfer: 01/17/21  Discharge Plan and Services In-house Referral: Clinical Social Work                                   Social Determinants of Health ([place]) Interventions     Readmission Risk Interventions No flowsheet data found.

## 2021-01-28 ENCOUNTER — Ambulatory Visit: Payer: Medicare Other

## 2021-01-29 ENCOUNTER — Encounter (HOSPITAL_COMMUNITY): Payer: Medicare Other

## 2021-01-29 ENCOUNTER — Ambulatory Visit: Payer: Medicare Other

## 2021-01-31 ENCOUNTER — Non-Acute Institutional Stay: Payer: Medicare Other | Admitting: Nurse Practitioner

## 2021-01-31 ENCOUNTER — Other Ambulatory Visit: Payer: Self-pay

## 2021-01-31 DIAGNOSIS — R52 Pain, unspecified: Secondary | ICD-10-CM

## 2021-01-31 DIAGNOSIS — Z515 Encounter for palliative care: Secondary | ICD-10-CM

## 2021-01-31 NOTE — Progress Notes (Signed)
[facility] Consult Note Telephone: [phone]  Fax: [phone]    Date of encounter: 01/31/21 PATIENT NAME: [name] [id number]   [phone] (home)  DOB: Sep 09, 1951 MRN: [id number]   PRIMARY CARE PROVIDER:    Caprice Renshaw, MD,  [place] [address] [phone]  REFERRING PROVIDER:   Caprice Renshaw, [address],  [place] [address] [phone]  RESPONSIBLE PARTY:    Contact Information    Name Relation Home Work Mobile   [name],[name] Spouse [phone]     [name] [phone]  [phone]   [name], [name]. Son   [phone]      I met face to face with patient in facility. Wife present in room at visit.  Palliative Care was asked to follow this patient by consultation request of  Caprice Renshaw, MD to address advance care planning and complex medical decision making. This is the initial visit.                                   ASSESSMENT AND PLAN / RECOMMENDATIONS:   Advance Care Planning/Goals of Care: Goals include to maximize quality of life and symptom management. Our advance care planning conversation included a discussion about:     The value and importance of advance care planning   Experiences with loved ones who have been seriously ill or have died   Exploration of personal, cultural or spiritual beliefs that might influence medical decisions   Exploration of goals of care in the event of a sudden injury or illness  Goal of Care: Patient goal of care is function. He verbalized desire to get stronger with therapy and be able to function independently. Code Status: Full Code Directives: Patient desire full resuscitation effort in the event of cardiac or respiratory arrest, saying he wants to stay around for as long as possible. He however does not want to live on Ventilator support for long, saying he would want his family to make a decision to take  him off the vent if he has no chance of survival. Reviewed signed MOST form today, no change made to sections. Details include Attempt resuscitation, full scope of treatment, antibiotics if indicated, IV fluids if indicate, feeding tube for a defined trial period.                                                                                                                                                             I spent 30 minutes providing this consultation. More than 50% of the time in this consultation was spent in counseling and care coordination.   ---------------------------------------------------------------------------------  Symptom Management/Plan: Pain at stump: Recommend scheduling Tylenol 1080m twice a day. Continue Tramadol 563mevery 6hrs as needed, continue PT/OT. Has follow up with vascular surgery on May 27th. Needs follow up on 5th toe. Constipation: likely related to use of tramadol, report last BM was 3 days ago. Recommend starting Sennokot 8.35m70mwo tablets by mouth daily for constipation.  Follow up Palliative Care Visit: Palliative care will continue to follow for complex medical decision making, advance care planning, and clarification of goals. Return in about 6-8 weeks or prn.  This visit was coded based on medical decision making (MDM).  PPS: 30%  HOSPICE ELIGIBILITY/DIAGNOSIS: TBD  CHIEF COMPLAIN: left leg stump  History obtained from review of EMR, discussion with facility staff, and interview patient and wife.   HISTORY OF PRESENT ILLNESS:  [name] a [age]o. year old male  with complain of severe pain in the left stump in the context of left AKA due to non-healing diabetic foot ulcer. Patient report pain of 9/10, described pain as sharp and throbbing.  Pain aggravated by movement relieved by pain med. Other medical condition include Afib on Eliquis, ischemic cardiomyopathy with EF 20-25%, insulin-dependent diabetes mellitus, PAD.   lab 01/17/2021 Na 136, K 3.7, Cr 0.95, GFR >60, Hgb 8.2, Hct 25.5 Reviewed Imaging  I reviewed available labs, medications, imaging, studies and related documents from the EMR.  Records reviewed and summarized above.   ROS General: NAD EYES: denies acute vision changes ENMT: denies dysphagia Cardiovascular: denies chest pain, denies DOE Pulmonary: denies cough, denies increased SOB Abdomen: endorses good appetite, endorsed constipation, endorses continence of bowel GU: denies dysuria, endorses incontinence of urine MSK:  endorsed weakness, no falls reported Skin: denies rashes, necrotic wound noted to right 5th toe Neurological: endorsed pain, denies insomnia Psych: Endorses positive mood Heme/lymph/immuno: denies bruises, abnormal bleeding  Physical Exam: Constitutional: NAD General: frail appearing, obese  EYES: anicteric sclera, no discharge  ENMT: intact hearing, oral mucous membranes moist CV: no LE edema Pulmonary: LCTA, no increased work of breathing, no cough, room air Abdomen: no ascites GU: deferred MSK: left AKA, left arm contracture Skin: warm and dry, no rashes, necrotic wound noted to right 5th toes Neuro:  generalized weakness,  no cognitive impairment Psych: non-anxious affect, A and O x 3 Hem/lymph/immuno: no widespread bruising  CURRENT PROBLEM LIST:  Patient Active Problem List   Diagnosis Date Noted  . Surgical wound dehiscence 01/10/2021  . Atrial fibrillation, chronic (HCC[place]atch4/10/2020  . Pressure injury of skin 12/11/2020  . Septic shock (HCCC[place] . Acute respiratory failure with hypoxia (HCC[place]nebago . Multifocal pneumonia   . Palliative care by specialist   . Goals of care, counseling/discussion   . Osteomyelitis of left foot (HCCWeddington . Cardiomyopathy (HCCLynnwood . PAD  (peripheral artery disease) /SMA Stenosis/Rt and Lt SFA Stenosis/ 10/28/2020  . SIRS (systemic inflammatory response syndrome) (HCCRolling Hills1/15/2022  . Sepsis --due to Infected Lt Foot/Ostepmyleitis---POA 10/26/2020  . Acute osteomyelitis of toe, left (HCCMiami Springs1/14/2022  . Diabetic ulcer of left heel (HCCNorway1/14/2022  . Fracture of left toe 10/25/2020  . Leukocytosis 10/25/2020  . Hypokalemia 10/25/2020  . Hypoalbuminemia 10/25/2020  . Hyperglycemia due to diabetes mellitus (HCCThaxton1/14/2022  . History of stroke 10/25/2020  . Abdominal pain, epigastric 02/09/2017  . Morbid obesity (HCCKingdom City2/04/2016  . Sedentary lifestyle 11/20/2015  . Type 2 diabetes mellitus with vascular disease (HCCHeartwell0/13/2016  . Bilateral carotid artery disease (HCCBelleville  05/01/2015  . Atherosclerosis of artery of extremity with ulceration ([place]) 04/08/2015  . Lower extremity edema 02/11/2015  . Tobacco use disorder 07/16/2014  . Chest pain 06/30/2014  . OSA on CPAP 06/30/2014  . Aftercare following surgery of the circulatory system, [place] 12/27/2013  . Chronic pain syndrome 11/03/2013  . Contracture of muscle of left upper arm 10/10/2013  . Foot fracture 10/10/2013  . Generalized OA 10/10/2013  . Anemia, iron deficiency 09/26/2013  . Insomnia 09/26/2013  . Stasis dermatitis 07/31/2013  . Peripheral autonomic neuropathy due to diabetes mellitus ([place]) 07/31/2013  . RLS (restless legs syndrome) 07/31/2013  . Allergic rhinitis 07/28/2013  . CAD, multiple vessel, 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 06/21/2013  . Essential hypertension 06/21/2013  . GERD (gastroesophageal reflux disease) 06/21/2013  . BPH (benign prostatic hyperplasia) 06/21/2013  . Cardiomyopathy, ischemic 02/22/2013  . Mixed hyperlipidemia 02/22/2013  . Atrial flutter ([place]) 01/02/2013  . Acute myocardial infarction, subendocardial infarction, initial episode of care ([place]) 01/02/2013  . Occlusion and stenosis of carotid artery without mention of cerebral infarction  10/27/2012  . CVA (cerebral infarction) 06/03/2012   PAST MEDICAL HISTORY:  Active Ambulatory Problems    Diagnosis Date Noted  . CVA (cerebral infarction) 06/03/2012  . Occlusion and stenosis of carotid artery without mention of cerebral infarction 10/27/2012  . Atrial flutter ([place]) 01/02/2013  . Acute myocardial infarction, subendocardial infarction, initial episode of care ([place]) 01/02/2013  . Cardiomyopathy, ischemic 02/22/2013  . Mixed hyperlipidemia 02/22/2013  . CAD, multiple vessel, 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 06/21/2013  . Essential hypertension 06/21/2013  . GERD (gastroesophageal reflux disease) 06/21/2013  . BPH (benign prostatic hyperplasia) 06/21/2013  . Allergic rhinitis 07/28/2013  . Stasis dermatitis 07/31/2013  . Peripheral autonomic neuropathy due to diabetes mellitus ([place]) 07/31/2013  . RLS (restless legs syndrome) 07/31/2013  . Anemia, iron deficiency 09/26/2013  . Insomnia 09/26/2013  . Contracture of muscle of left upper arm 10/10/2013  . Foot fracture 10/10/2013  . Generalized OA 10/10/2013  . Chronic pain syndrome 11/03/2013  . Aftercare following surgery of the circulatory system, [place] 12/27/2013  . Chest pain 06/30/2014  . OSA on CPAP 06/30/2014  . Tobacco use disorder 07/16/2014  . Lower extremity edema 02/11/2015  . Atherosclerosis of artery of extremity with ulceration ([place]) 04/08/2015  . Bilateral carotid artery disease ([place]) 05/01/2015  . Type 2 diabetes mellitus with vascular disease ([name]) 07/25/2015  . Sedentary lifestyle 11/20/2015  . Morbid obesity ([place]) 09/17/2016  . Abdominal pain, epigastric 02/09/2017  . Acute osteomyelitis of toe, left ([place]) 10/25/2020  . Diabetic ulcer of left heel ([place]) 10/25/2020  . Fracture of left toe 10/25/2020  . Leukocytosis 10/25/2020  . Hypokalemia 10/25/2020  . Hypoalbuminemia 10/25/2020  . Hyperglycemia due to diabetes mellitus ([place]) 10/25/2020  . History of stroke 10/25/2020  . SIRS (systemic inflammatory  response syndrome) ([place]) 10/26/2020  . Sepsis --due to Infected Lt Foot/Ostepmyleitis---POA 10/26/2020  . PAD (peripheral artery disease) /SMA Stenosis/Rt and Lt SFA Stenosis/ 10/28/2020  . Osteomyelitis of left foot ([place])   . Cardiomyopathy ([place])   . Palliative care by specialist   . Goals of care, counseling/discussion   . Septic shock ([place])   . Acute respiratory failure with hypoxia ([place])   . Multifocal pneumonia   . Pressure injury of skin 12/11/2020  . Surgical wound dehiscence 01/10/2021  . Atrial fibrillation, chronic ([place]) 01/10/2021   Resolved Ambulatory Problems    Diagnosis Date Noted  . Atypical chest pain 06/03/2012  . Type II or  unspecified type diabetes mellitus with peripheral circulatory disorders, uncontrolled(250.72) 06/03/2012  . Hypertension 06/03/2012  . S/P CABG x 5 01/19/2013  . CAD (coronary artery disease) 02/22/2013  . HLD (hyperlipidemia) 06/21/2013  . Urinary urgency 06/21/2013  . Sore throat 07/28/2013  . Unspecified constipation 09/26/2013  . Medication monitoring encounter 09/26/2013  . Constipation 10/10/2013   Past Medical History:  Diagnosis Date  . Carotid artery disease ([place])   . Chronic back pain   . Chronic pain   . Diabetes mellitus   . Humeral fracture 2016  . Hyperlipidemia   . Kidney stones   . Myocardial infarction [facility]) December 12, 2012  . OSA (obstructive sleep apnea)   . Pelvic fracture ([place])   . Peripheral neuropathy   . SDH (subdural hematoma) ([place])   . Stroke ([place])   . Traumatic brain injury [facility])    SOCIAL HX:  Social History   Tobacco Use  . Smoking status: Current Every Day Smoker    Packs/day: 1.00    Years: 47.00    Pack years: 47.00    Types: Cigarettes  . Smokeless tobacco: Never Used  Substance Use Topics  . Alcohol use: No    Alcohol/week: 0.0 standard drinks   FAMILY HX:  Family History  Problem Relation Age of Onset  . Diabetes Mellitus II Mother   . Diabetes Mother   . CAD Brother        s/p  CABG, PCI in 40-50s  . Diabetes Brother   . Heart attack Brother   . Heart disease Brother        Heart Disease before age 57  . Peripheral vascular disease Brother        amputation  . Diabetes Sister   . Hyperlipidemia Sister   . Diabetes Brother   . Alcohol abuse Brother      ALLERGIES:  Allergies  Allergen Reactions  . Flexeril [Cyclobenzaprine] Shortness Of Breath  . Tamsulosin Hcl Swelling, Rash and Other (See Comments)    Swelling around the mouth with rash on face Swelling around the mouth with rash on face Swelling around the mouth with rash on face  Swelling around the mouth with rash on face  . Tramadol Other (See Comments)    Headache  . Robaxin [Methocarbamol] Rash  . Aspirin     On plavix  . Morphine And Related Itching     PERTINENT MEDICATIONS:  Outpatient Encounter Medications as of 01/31/2021  Medication Sig  . acetaminophen (TYLENOL) 325 MG tablet Take 2 tablets (650 mg total) by mouth every 6 (six) hours as needed for mild pain or moderate pain.  Marland Kitchen amiodarone (PACERONE) 200 MG tablet Take 1 tablet (200 mg total) by mouth daily.  Marland Kitchen apixaban (ELIQUIS) 5 MG TABS tablet Take 1 tablet (5 mg total) by mouth 2 (two) times daily.  Marland Kitchen atorvastatin (LIPITOR) 40 MG tablet Take 1 tablet (40 mg total) by mouth at bedtime.  . donepezil (ARICEPT) 5 MG tablet Take 1 tablet (5 mg total) by mouth at bedtime.  . finasteride (PROSCAR) 5 MG tablet Take 5 mg by mouth daily at 12 noon.   . furosemide (LASIX) 40 MG tablet Take 0.5 tablets (20 mg total) by mouth daily at 12 noon. (Patient taking differently: Take 20 mg by mouth daily.)  . insulin aspart (NOVOLOG) 100 UNIT/ML injection Inject 0-20 Units into the skin See admin instructions. Per sliding scale tid  0-120=0 units, 121-150=3 units, 151-200=4 units, 201-250= 7 units, 351-300= 11 units,  301-350= 15 units, 351-400= 20 units  . magnesium oxide (MAG-OX) 400 MG tablet Take 400 mg by mouth daily.  . Maltodextrin-Xanthan Gum  (RESOURCE THICKENUP CLEAR) POWD Nectar thick with all liquids  . memantine (NAMENDA) 5 MG tablet Take 1 tablet (5 mg total) by mouth 2 (two) times daily.  . metoprolol tartrate (LOPRESSOR) 25 MG tablet Take 0.5 tablets (12.5 mg total) by mouth 2 (two) times daily.  . pantoprazole (PROTONIX) 40 MG tablet Take 1 tablet (40 mg total) by mouth daily.  . povidone-Iodine (BETADINE) 5 % SOLN topical solution Apply 1 application topically See admin instructions. Paint 5th toe with betadine qd for gangrene  . pregabalin (LYRICA) 25 MG capsule Take 1 capsule (25 mg total) by mouth daily.  Marland Kitchen PROTEIN PO Take 30 mLs by mouth See admin instructions. Protein liquid  bid for wound healing  . traMADol (ULTRAM) 50 MG tablet Take 1 tablet (50 mg total) by mouth every 6 (six) hours as needed for moderate pain or severe pain.   No facility-administered encounter medications on file as of 01/31/2021.    Thank you for the opportunity to participate in the care of Mr. Sia.  The palliative care team will continue to follow. Please call our office at [phone] if we can be of additional assistance   [place] Aymen Widrig, DNP, AGPCNP-BC  COVID-19 PATIENT SCREENING TOOL Asked and negative response unless otherwise noted:   Have you had symptoms of covid, tested positive or been in contact with someone with symptoms/positive test in the past 5-10 days?

## 2021-02-03 ENCOUNTER — Other Ambulatory Visit: Payer: Self-pay

## 2021-02-03 DIAGNOSIS — I739 Peripheral vascular disease, unspecified: Secondary | ICD-10-CM

## 2021-02-03 DIAGNOSIS — I6523 Occlusion and stenosis of bilateral carotid arteries: Secondary | ICD-10-CM

## 2021-02-03 NOTE — Addendum Note (Signed)
Addended byDoylene Bode on: 02/03/2021 06:52 PM   Modules accepted: Orders

## 2021-02-05 ENCOUNTER — Ambulatory Visit (INDEPENDENT_AMBULATORY_CARE_PROVIDER_SITE_OTHER): Payer: Medicare Other | Admitting: Physician Assistant

## 2021-02-05 ENCOUNTER — Ambulatory Visit (HOSPITAL_COMMUNITY)
Admission: RE | Admit: 2021-02-05 | Discharge: 2021-02-05 | Disposition: A | Discharge status: Home / Self Care | Payer: Medicare Other | Source: Ambulatory Visit | Attending: Vascular Surgery | Admitting: Vascular Surgery

## 2021-02-05 ENCOUNTER — Encounter: Payer: Self-pay | Admitting: Physician Assistant

## 2021-02-05 ENCOUNTER — Other Ambulatory Visit: Payer: Self-pay

## 2021-02-05 VITALS — BP 113/76 | HR 102 | Temp 97.3°F | Resp 20

## 2021-02-05 DIAGNOSIS — I739 Peripheral vascular disease, unspecified: Secondary | ICD-10-CM | POA: Insufficient documentation

## 2021-02-05 DIAGNOSIS — I6523 Occlusion and stenosis of bilateral carotid arteries: Secondary | ICD-10-CM

## 2021-02-05 DIAGNOSIS — I70261 Atherosclerosis of native arteries of extremities with gangrene, right leg: Secondary | ICD-10-CM

## 2021-02-05 DIAGNOSIS — Z89612 Acquired absence of left leg above knee: Secondary | ICD-10-CM
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
## 2021-02-12 ENCOUNTER — Ambulatory Visit (HOSPITAL_COMMUNITY): Payer: Medicare Other

## 2021-02-12 ENCOUNTER — Encounter (HOSPITAL_COMMUNITY): Payer: Medicare Other

## 2021-02-12 ENCOUNTER — Ambulatory Visit: Payer: Medicare Other | Admitting: Vascular Surgery

## 2021-03-05 ENCOUNTER — Ambulatory Visit: Payer: Medicare Other | Admitting: Vascular Surgery

## 2021-03-05 ENCOUNTER — Encounter (HOSPITAL_COMMUNITY): Payer: Medicare Other

## 2021-03-06 ENCOUNTER — Telehealth: Payer: Self-pay | Admitting: Cardiology

## 2021-03-06 NOTE — Telephone Encounter (Signed)
Please call [facility] in [place]. They said that they faxed in a form stating that the patient needs an arteriogram. Would like for Dr. Stanford Breed or nurse to call. Please call Marlaine Hind at  [phone] or [phone].

## 2021-03-07 NOTE — Telephone Encounter (Signed)
Left message for [name] to re-fax form to [phone]. She will call with questions.

## 2021-03-12 ENCOUNTER — Ambulatory Visit (HOSPITAL_COMMUNITY): Payer: Medicare Other

## 2021-03-12 ENCOUNTER — Ambulatory Visit: Payer: Medicare Other | Admitting: Vascular Surgery

## 2021-03-20 ENCOUNTER — Other Ambulatory Visit: Payer: Self-pay

## 2021-03-20 DIAGNOSIS — I739 Peripheral vascular disease, unspecified: Secondary | ICD-10-CM

## 2021-03-27 ENCOUNTER — Ambulatory Visit (INDEPENDENT_AMBULATORY_CARE_PROVIDER_SITE_OTHER): Payer: Medicare Other | Admitting: Gastroenterology

## 2021-03-27 ENCOUNTER — Ambulatory Visit (INDEPENDENT_AMBULATORY_CARE_PROVIDER_SITE_OTHER): Payer: Medicare HMO | Admitting: Gastroenterology

## 2021-03-27 ENCOUNTER — Other Ambulatory Visit: Payer: Self-pay

## 2021-03-27 ENCOUNTER — Encounter (INDEPENDENT_AMBULATORY_CARE_PROVIDER_SITE_OTHER): Payer: Self-pay | Admitting: Gastroenterology

## 2021-03-27 VITALS — BP 101/57 | HR 104 | Temp 98.5°F | Ht 67.0 in

## 2021-03-27 DIAGNOSIS — K219 Gastro-esophageal reflux disease without esophagitis: Secondary | ICD-10-CM | POA: Diagnosis not present

## 2021-03-27 DIAGNOSIS — K5903 Drug induced constipation: Secondary | ICD-10-CM | POA: Diagnosis not present

## 2021-03-27 MED ORDER — NALOXEGOL OXALATE 25 MG PO TABS: 25.0000 mg | ORAL_TABLET | Freq: Every day | ORAL | 3 refills | Status: DC
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
## 2021-03-27 NOTE — Patient Instructions (Signed)
Schedule colonoscopy Restart Movantik 25 mg qday Start taking Miralax 1 capful every day Continue pantoprazole 40 mg qday

## 2021-03-28 ENCOUNTER — Telehealth: Payer: Self-pay

## 2021-03-28 ENCOUNTER — Other Ambulatory Visit (INDEPENDENT_AMBULATORY_CARE_PROVIDER_SITE_OTHER): Payer: Self-pay

## 2021-03-28 DIAGNOSIS — Z01812 Encounter for preprocedural laboratory examination: Secondary | ICD-10-CM

## 2021-03-28 NOTE — Telephone Encounter (Signed)
Patient with diagnosis of atrial fibrillation on Eliquis for anticoagulation.    Procedure: colonoscopy Date of procedure: 04/29/21   CHA2DS2-VASc Score = 7  This indicates a 11.2% annual risk of stroke. The patient's score is based upon: CHF History: Yes HTN History: Yes Diabetes History: Yes Stroke History: Yes Vascular Disease History: Yes Age Score: 1 Gender Score: 0    CrCl 90 Platelet count 289  Chart only indicates that patient has history of stroke, noted in chart 05/2012.  Would recommend that he only hold Eliquis for 1 day prior to colonoscopy, but will send to primary cardiologist for final decision.

## 2021-03-28 NOTE — Telephone Encounter (Signed)
   [facility] Pre-operative Risk Assessment    Patient Name: [name]  DOB: 23-Mar-1951  MRN: [id number]   HEARTCARE STAFF: - Please ensure there is not already an duplicate clearance open for this procedure. - Under Visit Info/Reason for Call, type in Other and utilize the format Clearance MM/DD/YY or Clearance TBD. Do not use dashes or single digits. - If request is for dental extraction, please clarify the # of teeth to be extracted. - If the patient is currently at the dentist's office, call Pre-Op APP to address. If the patient is not currently in the dentist office, please route to the Pre-Op pool  Request for surgical clearance:  What type of surgery is being performed? Colonoscopy    When is this surgery scheduled? 04/29/2021   What type of clearance is required (medical clearance vs. Pharmacy clearance to hold med vs. Both)? pharmacy  Are there any medications that need to be held prior to surgery and how long? Eliquis 2 days    Practice name and name of physician performing surgery? Maylon Peppers MD [facility]   What is the office phone number? [phone]   7.   What is the office fax number? [phone]  8.   Anesthesia type (None, local, MAC, general) ? MAC   Ulice Brilliant T 03/28/2021, 12:11 PM  _________________________________________________________________   (provider comments below)

## 2021-03-31 ENCOUNTER — Encounter (INDEPENDENT_AMBULATORY_CARE_PROVIDER_SITE_OTHER): Payer: Self-pay

## 2021-03-31 ENCOUNTER — Telehealth (INDEPENDENT_AMBULATORY_CARE_PROVIDER_SITE_OTHER): Payer: Self-pay

## 2021-03-31 MED ORDER — PEG 3350-KCL-NA BICARB-NACL 420 G PO SOLR: 4000.0000 mL | ORAL | 0 refills | Status: DC

## 2021-03-31 NOTE — Telephone Encounter (Signed)
    [name] DOB:  07-22-1951  MRN:  [id number]   Primary Cardiologist: Kirk Ruths, MD  Chart reviewed as part of pre-operative protocol coverage. Patient has colonoscopy scheduled for 04/29/2021 and we were asked to give our recommendations for holding Eliquis. Given history of stroke, Pharmacy and Dr. Stanford Breed have recommended only holding for 1 day prior to colonoscopy. This should be restarted as soon as able following procedure.   I will route this recommendation to the requesting party via Epic fax function and remove from pre-op pool.  Please call with questions.  Darreld Mclean, PA-C 03/31/2021, 10:59 AM

## 2021-03-31 NOTE — Telephone Encounter (Signed)
[name], CMA  

## 2021-04-02 ENCOUNTER — Ambulatory Visit: Payer: Medicare Other | Admitting: Vascular Surgery

## 2021-04-02 ENCOUNTER — Encounter (HOSPITAL_COMMUNITY): Payer: Medicare Other

## 2021-04-09 ENCOUNTER — Other Ambulatory Visit: Payer: Self-pay

## 2021-04-09 ENCOUNTER — Encounter: Payer: Self-pay | Admitting: Vascular Surgery

## 2021-04-09 ENCOUNTER — Ambulatory Visit (HOSPITAL_COMMUNITY)
Admission: RE | Admit: 2021-04-09 | Discharge: 2021-04-09 | Disposition: A | Discharge status: Home / Self Care | Payer: Medicare Other | Source: Ambulatory Visit | Attending: Vascular Surgery | Admitting: Vascular Surgery

## 2021-04-09 ENCOUNTER — Ambulatory Visit (INDEPENDENT_AMBULATORY_CARE_PROVIDER_SITE_OTHER)
Admission: RE | Admit: 2021-04-09 | Discharge: 2021-04-09 | Disposition: A | Discharge status: Home / Self Care | Payer: Medicare Other | Source: Ambulatory Visit | Attending: Vascular Surgery | Admitting: Vascular Surgery

## 2021-04-09 ENCOUNTER — Ambulatory Visit (INDEPENDENT_AMBULATORY_CARE_PROVIDER_SITE_OTHER): Payer: Medicare Other | Admitting: Vascular Surgery

## 2021-04-09 VITALS — BP 94/60 | HR 60 | Temp 97.6°F | Resp 18

## 2021-04-09 DIAGNOSIS — I6523 Occlusion and stenosis of bilateral carotid arteries: Secondary | ICD-10-CM | POA: Insufficient documentation

## 2021-04-09 DIAGNOSIS — I739 Peripheral vascular disease, unspecified: Secondary | ICD-10-CM

## 2021-04-09 DIAGNOSIS — Z89612 Acquired absence of left leg above knee: Secondary | ICD-10-CM
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
## 2021-04-23 NOTE — Patient Instructions (Signed)
[name]  04/23/2021     @PREFPERIOPPHARMACY @   Your procedure is scheduled on  04/29/2021.   Report to [facility] at  0700  A.M.   Call this number if you have problems the morning of surgery:  [phone]   Remember:  Follow the diet and prep instructions given to you by the office.   Take 1/2 of your usual night time insulin dosage.  DO NOT take any medications for diabetes the morning of your procedure.     Take these medicines the morning of surgery with A SIP OF WATER     pacerone, namenda, metoprolol, protonix, lyrica, tramadol.     Do not wear jewelry, make-up or nail polish.  Do not wear lotions, powders, or perfumes, or deodorant.  Do not shave 48 hours prior to surgery.  Men may shave face and neck.  Do not bring valuables to the hospital.  [facility] is not responsible for any belongings or valuables.  Contacts, dentures or bridgework may not be worn into surgery.  Leave your suitcase in the car.  After surgery it may be brought to your room.  For patients admitted to the hospital, discharge time will be determined by your treatment team.  Patients discharged the day of surgery will not be allowed to drive home and must have someone with them for 24 hours.    Special instructions:     DO NOT smoke tobacco or vape for 24 hours before your procedure.  Please read over the following fact sheets that you were given. Anesthesia Post-op Instructions and Care and Recovery After Surgery      Colonoscopy, Adult, Care After This sheet gives you information about how to care for yourself after your procedure. Your health care provider may also give you more specific instructions. If you have problems or questions, contact your health careprovider. What can I expect after the procedure? After the procedure, it is common to have: A small amount of blood in your stool for 24 hours after the procedure. Some gas. Mild cramping or bloating of your  abdomen. Follow these instructions at home: Eating and drinking  Drink enough fluid to keep your urine pale yellow. Follow instructions from your health care provider about eating or drinking restrictions. Resume your normal diet as instructed by your health care provider. Avoid heavy or fried foods that are hard to digest.  Activity Rest as told by your health care provider. Avoid sitting for a long time without moving. Get up to take short walks every 1-2 hours. This is important to improve blood flow and breathing. Ask for help if you feel weak or unsteady. Return to your normal activities as told by your health care provider. Ask your health care provider what activities are safe for you. Managing cramping and bloating  Try walking around when you have cramps or feel bloated. Apply heat to your abdomen as told by your health care provider. Use the heat source that your health care provider recommends, such as a moist heat pack or a heating pad. Place a towel between your skin and the heat source. Leave the heat on for 20-30 minutes. Remove the heat if your skin turns bright red. This is especially important if you are unable to feel pain, heat, or cold. You may have a greater risk of getting burned.  General instructions If you were given a sedative during the procedure, it can affect you for  several hours. Do not drive or operate machinery until your health care provider says that it is safe. For the first 24 hours after the procedure: Do not sign important documents. Do not drink alcohol. Do your regular daily activities at a slower pace than normal. Eat soft foods that are easy to digest. Take over-the-counter and prescription medicines only as told by your health care provider. Keep all follow-up visits as told by your health care provider. This is important. Contact a health care provider if: You have blood in your stool 2-3 days after the procedure. Get help right away if you  have: More than a small spotting of blood in your stool. Large blood clots in your stool. Swelling of your abdomen. Nausea or vomiting. A fever. Increasing pain in your abdomen that is not relieved with medicine. Summary After the procedure, it is common to have a small amount of blood in your stool. You may also have mild cramping and bloating of your abdomen. If you were given a sedative during the procedure, it can affect you for several hours. Do not drive or operate machinery until your health care provider says that it is safe. Get help right away if you have a lot of blood in your stool, nausea or vomiting, a fever, or increased pain in your abdomen. This information is not intended to replace advice given to you by your health care provider. Make sure you discuss any questions you have with your healthcare provider. Document Revised: 09/22/2019 Document Reviewed: 04/24/2019 Elsevier Patient Education  [place] After This sheet gives you information about how to care for yourself after your procedure. Your health care provider may also give you more specific instructions. If you have problems or questions, contact your health careprovider. What can I expect after the procedure? After the procedure, it is common to have: Tiredness. Forgetfulness about what happened after the procedure. Impaired judgment for important decisions. Nausea or vomiting. Some difficulty with balance. Follow these instructions at home: For the time period you were told by your health care provider:     Rest as needed. Do not participate in activities where you could fall or become injured. Do not drive or use machinery. Do not drink alcohol. Do not take sleeping pills or medicines that cause drowsiness. Do not make important decisions or sign legal documents. Do not take care of children on your own. Eating and drinking Follow the diet that is recommended  by your health care provider. Drink enough fluid to keep your urine pale yellow. If you vomit: Drink water, juice, or soup when you can drink without vomiting. Make sure you have little or no nausea before eating solid foods. General instructions Have a responsible adult stay with you for the time you are told. It is important to have someone help care for you until you are awake and alert. Take over-the-counter and prescription medicines only as told by your health care provider. If you have sleep apnea, surgery and certain medicines can increase your risk for breathing problems. Follow instructions from your health care provider about wearing your sleep device: Anytime you are sleeping, including during daytime naps. While taking prescription pain medicines, sleeping medicines, or medicines that make you drowsy. Avoid smoking. Keep all follow-up visits as told by your health care provider. This is important. Contact a health care provider if: You keep feeling nauseous or you keep vomiting. You feel light-headed. You are still sleepy or having trouble  with balance after 24 hours. You develop a rash. You have a fever. You have redness or swelling around the IV site. Get help right away if: You have trouble breathing. You have new-onset confusion at home. Summary For several hours after your procedure, you may feel tired. You may also be forgetful and have poor judgment. Have a responsible adult stay with you for the time you are told. It is important to have someone help care for you until you are awake and alert. Rest as told. Do not drive or operate machinery. Do not drink alcohol or take sleeping pills. Get help right away if you have trouble breathing, or if you suddenly become confused. This information is not intended to replace advice given to you by your health care provider. Make sure you discuss any questions you have with your healthcare provider. Document Revised: 06/13/2020  Document Reviewed: 08/31/2019 Elsevier Patient Education  2022 [employer].

## 2021-04-24 ENCOUNTER — Other Ambulatory Visit: Payer: Self-pay

## 2021-04-24 ENCOUNTER — Ambulatory Visit (INDEPENDENT_AMBULATORY_CARE_PROVIDER_SITE_OTHER): Payer: Medicare Other | Admitting: Cardiology

## 2021-04-24 ENCOUNTER — Encounter: Payer: Self-pay | Admitting: Cardiology

## 2021-04-24 VITALS — BP 110/60 | HR 98 | Ht 67.0 in | Wt 198.6 lb

## 2021-04-24 DIAGNOSIS — I2510 Atherosclerotic heart disease of native coronary artery without angina pectoris: Secondary | ICD-10-CM

## 2021-04-24 DIAGNOSIS — E7800 Pure hypercholesterolemia, unspecified: Secondary | ICD-10-CM | POA: Diagnosis not present

## 2021-04-24 DIAGNOSIS — I10 Essential (primary) hypertension: Secondary | ICD-10-CM

## 2021-04-24 DIAGNOSIS — I255 Ischemic cardiomyopathy: Secondary | ICD-10-CM

## 2021-04-24 DIAGNOSIS — I4892 Unspecified atrial flutter: Secondary | ICD-10-CM | POA: Diagnosis not present

## 2021-04-24 MED ORDER — LOSARTAN POTASSIUM 25 MG PO TABS: 25.0000 mg | ORAL_TABLET | Freq: Every day | ORAL | 3 refills | Status: AC

## 2021-04-24 NOTE — Patient Instructions (Signed)
Medication Instructions:   START LOSARTAN 25 MG ONCE DAILY  *If you need a refill on your cardiac medications before your next appointment, please call your pharmacy*   Lab Work:  Your physician recommends that you return for lab work in: [place]  If you have labs (blood work) drawn today and your tests are completely normal, you will receive your results only by: [place] (if you have MyChart) OR A paper copy in the mail If you have any lab test that is abnormal or we need to change your treatment, we will call you to review the results.  Follow-Up: At [facility], you and your health needs are our priority.  As part of our continuing mission to provide you with exceptional heart care, we have created designated Provider Care Teams.  These Care Teams include your primary Cardiologist (physician) and Advanced Practice Providers (APPs -  Physician Assistants and Nurse Practitioners) who all work together to provide you with the care you need, when you need it.  We recommend signing up for the patient portal called "MyChart".  Sign up information is provided on this After Visit Summary.  MyChart is used to connect with patients for Virtual Visits (Telemedicine).  Patients are able to view lab/test results, encounter notes, upcoming appointments, etc.  Non-urgent messages can be sent to your provider as well.   To learn more about what you can do with MyChart, go to [email or web address].    Your next appointment:   8 week(s)  The format for your next appointment:   In Person  Provider:   You will see one of the following Advanced Practice Providers on your designated Care Team:   Sande Rives, PA-C Coletta Memos, FNP  Then, Kirk Ruths, MD will plan to see you again in 4 month(s).

## 2021-04-25 ENCOUNTER — Encounter (HOSPITAL_COMMUNITY)
Admission: RE | Admit: 2021-04-25 | Discharge: 2021-04-25 | Disposition: A | Discharge status: Home / Self Care | Payer: Medicare Other | Source: Ambulatory Visit | Attending: Gastroenterology | Admitting: Gastroenterology

## 2021-04-25 ENCOUNTER — Encounter (HOSPITAL_COMMUNITY): Payer: Self-pay

## 2021-04-25 DIAGNOSIS — Z01812 Encounter for preprocedural laboratory examination: Secondary | ICD-10-CM | POA: Diagnosis not present

## 2021-04-25 LAB — BASIC METABOLIC PANEL
Anion gap: 6 (ref 5–15)
BUN: 18 mg/dL (ref 8–23)
CO2: 28 mmol/L (ref 22–32)
Calcium: 8.7 mg/dL — ABNORMAL LOW (ref 8.9–10.3)
Chloride: 102 mmol/L (ref 98–111)
Creatinine, Ser: 1.04 mg/dL (ref 0.61–1.24)
GFR, Estimated: >60 mL/min (ref >60)
Glucose, Bld: 210 mg/dL — ABNORMAL HIGH (ref 70–99)
Potassium: 4.1 mmol/L (ref 3.5–5.1)
Sodium: 136 mmol/L (ref 135–145)

## 2021-04-29 ENCOUNTER — Encounter (HOSPITAL_COMMUNITY): Payer: Self-pay | Admitting: Gastroenterology

## 2021-04-29 ENCOUNTER — Ambulatory Visit (HOSPITAL_COMMUNITY): Payer: Medicare Other | Admitting: Anesthesiology

## 2021-04-29 ENCOUNTER — Encounter (HOSPITAL_COMMUNITY)
Admission: RE | Disposition: A | Discharge status: Home / Self Care | Payer: Self-pay | Source: Home / Self Care | Attending: Gastroenterology

## 2021-04-29 ENCOUNTER — Telehealth (INDEPENDENT_AMBULATORY_CARE_PROVIDER_SITE_OTHER): Payer: Self-pay | Admitting: *Deleted

## 2021-04-29 ENCOUNTER — Ambulatory Visit (HOSPITAL_COMMUNITY)
Admission: RE | Admit: 2021-04-29 | Discharge: 2021-04-29 | Disposition: A | Discharge status: Home / Self Care | Payer: Medicare Other | Attending: Gastroenterology | Admitting: Gastroenterology

## 2021-04-29 DIAGNOSIS — Z888 Allergy status to other drugs, medicaments and biological substances status: Secondary | ICD-10-CM | POA: Insufficient documentation

## 2021-04-29 DIAGNOSIS — Z79899 Other long term (current) drug therapy: Secondary | ICD-10-CM | POA: Diagnosis not present

## 2021-04-29 DIAGNOSIS — I2510 Atherosclerotic heart disease of native coronary artery without angina pectoris: Secondary | ICD-10-CM | POA: Diagnosis not present

## 2021-04-29 DIAGNOSIS — E1142 Type 2 diabetes mellitus with diabetic polyneuropathy: Secondary | ICD-10-CM | POA: Insufficient documentation

## 2021-04-29 DIAGNOSIS — Z885 Allergy status to narcotic agent status: Secondary | ICD-10-CM | POA: Insufficient documentation

## 2021-04-29 DIAGNOSIS — Z1211 Encounter for screening for malignant neoplasm of colon: Secondary | ICD-10-CM | POA: Insufficient documentation

## 2021-04-29 DIAGNOSIS — D123 Benign neoplasm of transverse colon: Secondary | ICD-10-CM | POA: Insufficient documentation

## 2021-04-29 DIAGNOSIS — F17210 Nicotine dependence, cigarettes, uncomplicated: Secondary | ICD-10-CM | POA: Diagnosis not present

## 2021-04-29 DIAGNOSIS — Z89612 Acquired absence of left leg above knee: Secondary | ICD-10-CM | POA: Insufficient documentation

## 2021-04-29 DIAGNOSIS — Z8673 Personal history of transient ischemic attack (TIA), and cerebral infarction without residual deficits: Secondary | ICD-10-CM | POA: Diagnosis not present

## 2021-04-29 DIAGNOSIS — I252 Old myocardial infarction: Secondary | ICD-10-CM | POA: Insufficient documentation

## 2021-04-29 DIAGNOSIS — Z09 Encounter for follow-up examination after completed treatment for conditions other than malignant neoplasm: Secondary | ICD-10-CM | POA: Diagnosis not present

## 2021-04-29 DIAGNOSIS — Z7901 Long term (current) use of anticoagulants: Secondary | ICD-10-CM | POA: Insufficient documentation

## 2021-04-29 DIAGNOSIS — Z8249 Family history of ischemic heart disease and other diseases of the circulatory system: Secondary | ICD-10-CM | POA: Insufficient documentation

## 2021-04-29 DIAGNOSIS — Z951 Presence of aortocoronary bypass graft: Secondary | ICD-10-CM | POA: Insufficient documentation

## 2021-04-29 DIAGNOSIS — E1151 Type 2 diabetes mellitus with diabetic peripheral angiopathy without gangrene: Secondary | ICD-10-CM | POA: Insufficient documentation

## 2021-04-29 DIAGNOSIS — Z833 Family history of diabetes mellitus: Secondary | ICD-10-CM | POA: Diagnosis not present

## 2021-04-29 DIAGNOSIS — I10 Essential (primary) hypertension: Secondary | ICD-10-CM | POA: Insufficient documentation

## 2021-04-29 DIAGNOSIS — G4733 Obstructive sleep apnea (adult) (pediatric): Secondary | ICD-10-CM | POA: Diagnosis not present

## 2021-04-29 DIAGNOSIS — Z794 Long term (current) use of insulin: Secondary | ICD-10-CM | POA: Insufficient documentation

## 2021-04-29 DIAGNOSIS — Z86010 Personal history of colonic polyps: Secondary | ICD-10-CM | POA: Diagnosis not present

## 2021-04-29 DIAGNOSIS — E785 Hyperlipidemia, unspecified: Secondary | ICD-10-CM | POA: Insufficient documentation

## 2021-04-29 DIAGNOSIS — Z886 Allergy status to analgesic agent status: Secondary | ICD-10-CM | POA: Insufficient documentation

## 2021-04-29 DIAGNOSIS — Z01812 Encounter for preprocedural laboratory examination: Secondary | ICD-10-CM

## 2021-04-29 DIAGNOSIS — I4891 Unspecified atrial fibrillation: Secondary | ICD-10-CM | POA: Diagnosis not present

## 2021-04-29 HISTORY — PX: POLYPECTOMY: SHX5525

## 2021-04-29 HISTORY — PX: COLONOSCOPY WITH PROPOFOL: SHX5780

## 2021-04-29 LAB — GLUCOSE, CAPILLARY
Glucose-Capillary: 161 mg/dL — ABNORMAL HIGH (ref 70–99)
Glucose-Capillary: 152 mg/dL — ABNORMAL HIGH (ref 70–99)

## 2021-04-29 SURGERY — COLONOSCOPY WITH PROPOFOL
Anesthesia: General

## 2021-04-29 MED ORDER — PROPOFOL 10 MG/ML IV BOLUS
INTRAVENOUS | Status: DC | PRN
  Administered 2021-04-29: 25 ug/kg/min via INTRAVENOUS

## 2021-04-29 MED ORDER — METOPROLOL TARTRATE 5 MG/5ML IV SOLN
INTRAVENOUS | Status: AC
  Filled 2021-04-29: qty 5

## 2021-04-29 MED ORDER — KETAMINE HCL 10 MG/ML IJ SOLN
INTRAMUSCULAR | Status: DC | PRN
  Administered 2021-04-29: 10 mg via INTRAVENOUS

## 2021-04-29 MED ORDER — STERILE WATER FOR IRRIGATION IR SOLN
Status: DC | PRN
  Administered 2021-04-29: 1.5 mL

## 2021-04-29 MED ORDER — METOPROLOL TARTRATE 5 MG/5ML IV SOLN
1.0000 mg | INTRAVENOUS | Status: AC | PRN
Start: 2021-04-29 — End: 2021-04-29
  Administered 2021-04-29 (×2): 1 mg via INTRAVENOUS

## 2021-04-29 MED ORDER — PHENYLEPHRINE HCL (PRESSORS) 10 MG/ML IV SOLN
INTRAVENOUS | Status: DC | PRN
  Administered 2021-04-29 (×2): 50 ug via INTRAVENOUS

## 2021-04-29 MED ORDER — LACTATED RINGERS IV SOLN: INTRAVENOUS | Status: DC

## 2021-04-29 MED ORDER — KETAMINE HCL 50 MG/5ML IJ SOSY
PREFILLED_SYRINGE | INTRAMUSCULAR | Status: AC
  Filled 2021-04-29: qty 5
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
## 2021-04-29 NOTE — Anesthesia Preprocedure Evaluation (Addendum)
Anesthesia Evaluation  Patient identified by MRN, date of birth, ID band Patient awake    Reviewed: Allergy & Precautions, NPO status , Patient's Chart, lab work & pertinent test results, reviewed documented beta blocker date and time   Airway Mallampati: II  TM Distance: >3 FB Neck ROM: Full    Dental  (+) Edentulous Upper, Edentulous Lower   Pulmonary sleep apnea and Continuous Positive Airway Pressure Ventilation , pneumonia, Current Smoker and Patient abstained from smoking.,    Pulmonary exam normal breath sounds clear to auscultation       Cardiovascular Exercise Tolerance: Poor hypertension, Pt. on medications and Pt. on home beta blockers + CAD, + Past MI, + CABG, + Peripheral Vascular Disease and +CHF (EF 20 - 25%)  + dysrhythmias Atrial Fibrillation  Rhythm:Irregular Rate:Abnormal - Systolic murmurs, - Diastolic murmurs, - Friction Rub, - Carotid Bruit, - Peripheral Edema and - Systolic Click 1. Left ventricular ejection fraction, by estimation, is 20 to 25%. The left ventricle has severely decreased function. The left ventricle demonstrates global hypokinesis. Left ventricular diastolic parameters are indeterminate.  2. Right ventricular systolic function is normal. The right ventricular size is normal.  3. Left atrial size was mildly dilated.  4. The mitral valve is abnormal. Trivial mitral valve regurgitation. No evidence of mitral stenosis. Moderate mitral annular calcification.  5. The aortic valve is abnormal. There is moderate calcification of the aortic valve. There is moderate thickening of the aortic valve. Aortic valve regurgitation is not visualized. Mild to moderate aortic valve sclerosis/calcification is present, without any evidence of aortic stenosis.  6. The inferior vena cava is normal in size with greater than 50% respiratory variability, suggesting right atrial pressure of 3 mmHg.    Neuro/Psych   Neuromuscular disease CVA, Residual Symptoms    GI/Hepatic GERD  Medicated,  Endo/Other  diabetes, Well Controlled, Type 2, Insulin Dependent  Renal/GU Renal InsufficiencyRenal disease     Musculoskeletal  (+) Arthritis  (chronic back pain),   Abdominal   Peds  Hematology  (+) anemia ,   Anesthesia Other Findings   Reproductive/Obstetrics                            Anesthesia Physical Anesthesia Plan  ASA: 4  Anesthesia Plan: General   Post-op Pain Management:    Induction: Intravenous  PONV Risk Score and Plan: TIVA  Airway Management Planned: Nasal Cannula and Natural Airway  Additional Equipment:   Intra-op Plan:   Post-operative Plan:   Informed Consent: I have reviewed the patients History and Physical, chart, labs and discussed the procedure including the risks, benefits and alternatives for the proposed anesthesia with the patient or authorized representative who has indicated his/her understanding and acceptance.     Dental advisory given  Plan Discussed with: CRNA and Surgeon  Anesthesia Plan Comments:         Anesthesia Quick Evaluation

## 2021-04-29 NOTE — Transfer of Care (Signed)
Immediate Anesthesia Transfer of Care Note  Patient: [name]  Procedure(s) Performed: COLONOSCOPY WITH PROPOFOL POLYPECTOMY  Patient Location: PACU  Anesthesia Type:MAC  Level of Consciousness: awake, alert , patient cooperative and responds to stimulation  Airway & Oxygen Therapy: Patient Spontanous Breathing  Post-op Assessment: Report given to RN, Post -op Vital signs reviewed and stable and Patient moving all extremities X 4  Post vital signs: Reviewed and stable  Last Vitals:  Vitals Value Taken Time  BP    Temp    Pulse    Resp    SpO2      Last Pain:  Vitals:   04/29/21 0905  TempSrc:   PainSc: 0-No pain      Patients Stated Pain Goal: 9 (12/82/08 1388)  Complications: No notable events documented.

## 2021-04-29 NOTE — Anesthesia Postprocedure Evaluation (Signed)
Anesthesia Post Note  Patient: [name]  Procedure(s) Performed: COLONOSCOPY WITH PROPOFOL POLYPECTOMY  Patient location during evaluation: PACU Anesthesia Type: General Level of consciousness: awake and alert and oriented Pain management: pain level controlled Vital Signs Assessment: post-procedure vital signs reviewed and stable Respiratory status: spontaneous breathing and respiratory function stable Cardiovascular status: blood pressure returned to baseline and stable Postop Assessment: no apparent nausea or vomiting Anesthetic complications: no   No notable events documented.   Last Vitals:  Vitals:   04/29/21 0939 04/29/21 0945  BP: 112/70 (!) 129/91  Pulse: 60 71  Resp: 15 (!) 26  Temp:    SpO2: 100% 97%    Last Pain:  Vitals:   04/29/21 0945  TempSrc:   PainSc: 0-No pain                 Carilyn Woolston C Abimelec Grochowski

## 2021-04-29 NOTE — Discharge Instructions (Addendum)
You are being discharged to home.  Resume your previous diet.  We are waiting for your pathology results.  Your physician has recommended a repeat colonoscopy at the next available appointment (within 3 months) because the bowel preparation was poor.  Restart Eliquis today

## 2021-04-29 NOTE — Op Note (Signed)
[facility] Patient Name: [name] Procedure Date: 04/29/2021 9:11 AM MRN: [id number] Date of Birth: 08/24/1951 Attending MD: Maylon Peppers ,  CSN: [id number] Age: 70 Admit Type: Outpatient Procedure:                Colonoscopy Indications:              High risk colon cancer surveillance: Personal                            history of colonic polyps Providers:                Maylon Peppers, Caprice Kluver, Raphael Gibney,                            Technician Referring MD:              Medicines:                Monitored Anesthesia Care Complications:            No immediate complications. Estimated Blood Loss:     Estimated blood loss: none. Procedure:                Pre-Anesthesia Assessment:                           - Prior to the procedure, a History and Physical                            was performed, and patient medications, allergies                            and sensitivities were reviewed. The patient's                            tolerance of previous anesthesia was reviewed.                           - The risks and benefits of the procedure and the                            sedation options and risks were discussed with the                            patient. All questions were answered and informed                            consent was obtained.                           - ASA Grade Assessment: IV - A patient with severe                            systemic disease that is a constant threat to life.                           After obtaining informed consent, the colonoscope  was passed under direct vision. Throughout the                            procedure, the patient's blood pressure, pulse, and                            oxygen saturations were monitored continuously. The                            [id number]-[id number] ([id number]) scope was introduced through                            the anus and advanced to the the cecum, identified                             by appendiceal orifice and ileocecal valve. The                            colonoscopy was technically difficult and complex                            due to poor bowel prep. The patient tolerated the                            procedure well. The quality of the bowel                            preparation was poor. Scope In: 9:13:12 AM Scope Out: 9:28:55 AM Scope Withdrawal Time: 0 hours 10 minutes 19 seconds  Total Procedure Duration: 0 hours 15 minutes 43 seconds  Findings:      The perianal and digital rectal examinations were normal.      A 6 mm polyp was found in the transverse colon. The polyp was sessile.       The polyp was removed with a cold snare. Resection and retrieval were       complete.      A large amount of semi-solid solid stool was found in the entire colon,       interfering with visualization.      The retroflexed view of the distal rectum and anal verge was normal and       showed no anal or rectal abnormalities. Impression:               - Preparation of the colon was poor.                           - One 6 mm polyp in the transverse colon, removed                            with a cold snare. Resected and retrieved.                           - Stool in the entire examined colon.                           -  The distal rectum and anal verge are normal on                            retroflexion view. Moderate Sedation:      Per Anesthesia Care Recommendation:           - Discharge patient to home (ambulatory).                           - Resume previous diet.                           - Await pathology results.                           - If patient is agreeable, repeat colonoscopy at                            next available appointment (within 3 months)                            because the bowel preparation was poor.                           - Restart Eliquis today. Procedure Code(s):        --- Professional ---                            [phone], Colonoscopy, flexible; with removal of                            tumor(s), polyp(s), or other lesion(s) by snare                            technique Diagnosis Code(s):        --- Professional ---                           Z86.010, Personal history of colonic polyps                           K63.5, Polyp of colon CPT copyright 2019 American Medical Association. All rights reserved. The codes documented in this report are preliminary and upon coder review may  be revised to meet current compliance requirements. Maylon Peppers, MD Maylon Peppers,  04/29/2021 9:33:58 AM This report has been signed electronically. Number of Addenda: 0

## 2021-04-29 NOTE — Telephone Encounter (Signed)
If patient is agreeable, repeat colonoscopy at next available appointment (within 3 months) because the bowel preparation was poor

## 2021-04-30 ENCOUNTER — Other Ambulatory Visit: Payer: Self-pay

## 2021-05-01 LAB — SURGICAL PATHOLOGY

## 2021-05-01 NOTE — Addendum Note (Signed)
Addended by: Hinton Dyer on: 05/01/2021 02:25 PM   Modules accepted: Orders

## 2021-05-02 ENCOUNTER — Encounter (HOSPITAL_COMMUNITY): Payer: Self-pay | Admitting: Gastroenterology

## 2021-05-09 ENCOUNTER — Ambulatory Visit (HOSPITAL_COMMUNITY)
Admission: RE | Admit: 2021-05-09 | Discharge: 2021-05-09 | Disposition: A | Discharge status: Home / Self Care | Payer: Medicare Other | Attending: Vascular Surgery | Admitting: Vascular Surgery

## 2021-05-09 ENCOUNTER — Encounter (HOSPITAL_COMMUNITY): Payer: Self-pay | Admitting: Vascular Surgery

## 2021-05-09 ENCOUNTER — Other Ambulatory Visit: Payer: Self-pay

## 2021-05-09 ENCOUNTER — Encounter (HOSPITAL_COMMUNITY)
Admission: RE | Disposition: A | Discharge status: Home / Self Care | Payer: Self-pay | Source: Home / Self Care | Attending: Vascular Surgery

## 2021-05-09 DIAGNOSIS — Z885 Allergy status to narcotic agent status: Secondary | ICD-10-CM | POA: Insufficient documentation

## 2021-05-09 DIAGNOSIS — Z79899 Other long term (current) drug therapy: Secondary | ICD-10-CM | POA: Insufficient documentation

## 2021-05-09 DIAGNOSIS — F17210 Nicotine dependence, cigarettes, uncomplicated: Secondary | ICD-10-CM | POA: Diagnosis not present

## 2021-05-09 DIAGNOSIS — E1152 Type 2 diabetes mellitus with diabetic peripheral angiopathy with gangrene: Secondary | ICD-10-CM | POA: Insufficient documentation

## 2021-05-09 DIAGNOSIS — Z794 Long term (current) use of insulin: Secondary | ICD-10-CM | POA: Insufficient documentation

## 2021-05-09 DIAGNOSIS — Z89612 Acquired absence of left leg above knee: Secondary | ICD-10-CM | POA: Diagnosis not present

## 2021-05-09 DIAGNOSIS — I96 Gangrene, not elsewhere classified: Secondary | ICD-10-CM

## 2021-05-09 DIAGNOSIS — I6523 Occlusion and stenosis of bilateral carotid arteries: Secondary | ICD-10-CM | POA: Diagnosis not present

## 2021-05-09 DIAGNOSIS — I739 Peripheral vascular disease, unspecified: Secondary | ICD-10-CM

## 2021-05-09 DIAGNOSIS — Z886 Allergy status to analgesic agent status: Secondary | ICD-10-CM | POA: Diagnosis not present

## 2021-05-09 DIAGNOSIS — Z7901 Long term (current) use of anticoagulants: Secondary | ICD-10-CM | POA: Insufficient documentation

## 2021-05-09 DIAGNOSIS — Z888 Allergy status to other drugs, medicaments and biological substances status: Secondary | ICD-10-CM | POA: Insufficient documentation

## 2021-05-09 DIAGNOSIS — I70261 Atherosclerosis of native arteries of extremities with gangrene, right leg: Secondary | ICD-10-CM | POA: Insufficient documentation

## 2021-05-09 DIAGNOSIS — E7800 Pure hypercholesterolemia, unspecified: Secondary | ICD-10-CM

## 2021-05-09 HISTORY — PX: ABDOMINAL AORTOGRAM W/LOWER EXTREMITY: CATH118223

## 2021-05-09 LAB — POCT I-STAT, CHEM 8
BUN: 14 mg/dL (ref 8–23)
Calcium, Ion: 1.24 mmol/L (ref 1.15–1.40)
Chloride: 103 mmol/L (ref 98–111)
Creatinine, Ser: 1 mg/dL (ref 0.61–1.24)
Glucose, Bld: 178 mg/dL — ABNORMAL HIGH (ref 70–99)
HCT: 39 % (ref 39.0–52.0)
Hemoglobin: 13.3 g/dL (ref 13.0–17.0)
Potassium: 3.8 mmol/L (ref 3.5–5.1)
Sodium: 141 mmol/L (ref 135–145)
TCO2: 28 mmol/L (ref 22–32)

## 2021-05-09 LAB — GLUCOSE, CAPILLARY: Glucose-Capillary: 149 mg/dL — ABNORMAL HIGH (ref 70–99)

## 2021-05-09 SURGERY — ABDOMINAL AORTOGRAM W/LOWER EXTREMITY
Anesthesia: LOCAL | Laterality: Right

## 2021-05-09 MED ORDER — DONEPEZIL HCL 5 MG PO TABS: 5.0000 mg | ORAL_TABLET | Freq: Every day | ORAL | Status: AC

## 2021-05-09 MED ORDER — IODIXANOL 320 MG/ML IV SOLN
INTRAVENOUS | Status: DC | PRN
  Administered 2021-05-09: 70 mL

## 2021-05-09 MED ORDER — PREGABALIN 25 MG PO CAPS: 25.0000 mg | ORAL_CAPSULE | Freq: Every day | ORAL | Status: AC

## 2021-05-09 MED ORDER — MEMANTINE HCL 5 MG PO TABS: 5.0000 mg | ORAL_TABLET | Freq: Two times a day (BID) | ORAL | Status: AC

## 2021-05-09 MED ORDER — INSULIN ASPART 100 UNIT/ML ~~LOC~~ SOLN: 0.0000 [IU] | Freq: Three times a day (TID) | SUBCUTANEOUS | Status: AC

## 2021-05-09 MED ORDER — HEPARIN (PORCINE) IN NACL 1000-0.9 UT/500ML-% IV SOLN
INTRAVENOUS | Status: AC
  Filled 2021-05-09: qty 1000

## 2021-05-09 MED ORDER — HEPARIN (PORCINE) IN NACL 1000-0.9 UT/500ML-% IV SOLN
INTRAVENOUS | Status: DC | PRN
  Administered 2021-05-09 (×2): 500 mL

## 2021-05-09 MED ORDER — SODIUM CHLORIDE 0.9 % IV SOLN: INTRAVENOUS | Status: DC

## 2021-05-09 MED ORDER — SODIUM CHLORIDE 0.9% FLUSH: 3.0000 mL | INTRAVENOUS | Status: DC | PRN

## 2021-05-09 MED ORDER — SODIUM CHLORIDE 0.9 % IV SOLN: 250.0000 mL | INTRAVENOUS | Status: DC | PRN

## 2021-05-09 MED ORDER — ONDANSETRON HCL 4 MG/2ML IJ SOLN: 4.0000 mg | Freq: Four times a day (QID) | INTRAMUSCULAR | Status: DC | PRN

## 2021-05-09 MED ORDER — METOPROLOL TARTRATE 25 MG PO TABS: 12.5000 mg | ORAL_TABLET | Freq: Two times a day (BID) | ORAL | 0 refills | Status: DC

## 2021-05-09 MED ORDER — LIDOCAINE HCL (PF) 1 % IJ SOLN
INTRAMUSCULAR | Status: DC | PRN
  Administered 2021-05-09: 15 mL via INTRADERMAL

## 2021-05-09 MED ORDER — HYDRALAZINE HCL 20 MG/ML IJ SOLN: 5.0000 mg | INTRAMUSCULAR | Status: DC | PRN

## 2021-05-09 MED ORDER — SODIUM CHLORIDE 0.9 % WEIGHT BASED INFUSION
1.0000 mL/kg/h | INTRAVENOUS | Status: DC
  Administered 2021-05-09: 1 mL/kg/h via INTRAVENOUS

## 2021-05-09 MED ORDER — LIDOCAINE HCL (PF) 1 % IJ SOLN
INTRAMUSCULAR | Status: AC
  Filled 2021-05-09: qty 30

## 2021-05-09 MED ORDER — ACETAMINOPHEN 325 MG PO TABS: 650.0000 mg | ORAL_TABLET | ORAL | Status: DC | PRN

## 2021-05-09 MED ORDER — SODIUM CHLORIDE 0.9% FLUSH: 3.0000 mL | Freq: Two times a day (BID) | INTRAVENOUS | Status: DC

## 2021-05-09 MED ORDER — LABETALOL HCL 5 MG/ML IV SOLN: 10.0000 mg | INTRAVENOUS | Status: DC | PRN

## 2021-05-09 MED ORDER — ATORVASTATIN CALCIUM 40 MG PO TABS: 40.0000 mg | ORAL_TABLET | Freq: Every day | ORAL | 1 refills | Status: AC

## 2021-05-09 SURGICAL SUPPLY — 11 items
CATH ANGIO 5F PIGTAIL 65CM (CATHETERS) ×2 IMPLANT
CATH CROSS OVER TEMPO 5F (CATHETERS) ×1 IMPLANT
CATH STRAIGHT 5FR 65CM (CATHETERS) ×2 IMPLANT
KIT MICROPUNCTURE NIT STIFF (SHEATH) ×1 IMPLANT
KIT PV (KITS) ×2 IMPLANT
SHEATH PINNACLE 5F 10CM (SHEATH) ×2 IMPLANT
SHEATH PROBE COVER 6X72 (BAG) ×1 IMPLANT
SYR MEDRAD MARK V 150ML (SYRINGE) ×1 IMPLANT
TRANSDUCER W/STOPCOCK (MISCELLANEOUS) ×2 IMPLANT
TRAY PV CATH (CUSTOM PROCEDURE TRAY) ×2 IMPLANT
WIRE BENTSON .035X145CM (WIRE) ×1 IMPLANT

## 2021-05-09 NOTE — Op Note (Signed)
   PATIENT: [name]      MRN: [id number] DOB: March 19, 1951    DATE OF PROCEDURE: 05/09/2021  INDICATIONS:    [name] is a 70 y.o. male who was seen in the office with gangrene of the first and fifth toes.  He had evidence of diffuse peripheral vascular disease.  He has had a left AKA.  He presents for arteriography and possible intervention  PROCEDURE:    Ultrasound-guided access to the left common femoral artery Aortogram with bilateral iliac arteriogram Selective catheterization of the right external iliac artery (second order catheterization) with right lower extremity runoff  SURGEON: Judeth Cornfield. Scot Dock, MD, FACS  ANESTHESIA: Local  EBL: Minimal  TECHNIQUE: The patient was brought to the peripheral vascular lab. Both groins were prepped and draped in the usual sterile fashion.  Under ultrasound guidance, after the skin was anesthetized, I cannulated the left common femoral artery with a micropuncture needle and a micropuncture sheath was introduced over a wire.  This was exchanged for a 5 [place] sheath over a Bentson wire.  By ultrasound the femoral artery was patent. A real-time image was obtained and sent to the server.  I then positioned a pigtail catheter at the L1 vertebral body and flush aortogram obtained.  The catheter was in position below the renals and an oblique iliac projection was obtained.  I then exchanged the pigtail catheter for a crossover catheter which was positioned into the right common iliac artery.  A wire was advanced into the external iliac artery and the crossover catheter exchanged for a straight catheter.  Selective right external iliac arteriogram was obtained with right lower extremity runoff  FINDINGS:   There are 2 right renal arteries and a single left renal artery.  No significant renal artery stenosis is identified. The infrarenal aorta has mild diffuse disease but no focal stenosis.  There is significant calcific disease.  At the  aortic bifurcation. On the right side the common iliac artery, external iliac artery, and hypogastric arteries are patent.  There is some mild narrowing at the proximal right common iliac artery but I did not measure a significant pressure gradient across the stenosis using a pullback technique with the straight catheter. Below that, there is calcific disease in the common femoral artery but no focal stenosis.  The deep femoral artery and superficial femoral artery are patent.  There is moderate diffuse disease throughout the superficial femoral artery but no critical stenosis.  The popliteal artery is patent.  The anterior tibial and peroneal arteries are occluded.  There is single-vessel runoff on the right via the posterior tibial artery which has mild diffuse disease.  CLINICAL NOTE: The patient has inline flow through the posterior tibial artery.  He has diffuse mild disease and I do not see any thing that we could address with an endovascular approach that would significantly impact his distal perfusion.  In addition, any intervention would be associated with risk of distal embolization.  I think his best chance is if the toes progressed to amputate the toes.  If this did not heal he would require an above-the-knee amputation on the right as he is nonambulatory and has a left above-the-knee amputation.  I will arrange follow-up in my office in approximately 3 weeks.   Deitra Mayo, MD, FACS [facility]  DATE OF DICTATION:   05/09/2021

## 2021-05-09 NOTE — Interval H&P Note (Signed)
History and Physical Interval Note:  05/09/2021 8:32 AM  [name]  has presented today for surgery, with the diagnosis of PAD.  The various methods of treatment have been discussed with the patient and family. After consideration of risks, benefits and other options for treatment, the patient has consented to  Procedure(s): ABDOMINAL AORTOGRAM W/LOWER EXTREMITY (Right) as a surgical intervention.  The patient's history has been reviewed, patient examined, no change in status, stable for surgery.  I have reviewed the patient's chart and labs.  Questions were answered to the patient's satisfaction.     Deitra Mayo

## 2021-05-09 NOTE — Progress Notes (Signed)
5Fr left femoral arterial sheath aspirated and removed by Wynonia Sours.  Manual pressure held for 10 minutes, then taken over by Leone Payor for the remainder of the hold.  hemostasis achieved.  Site level 0.  Tegaderm dressing applied.  Instructions given to pt, pt verbalizes understanding. Right distal pulses dopplered by Wynonia Sours prior to sheath removal.    Bedrest begins 10:20.

## 2021-05-09 NOTE — Discharge Instructions (Signed)
Femoral Site Care This sheet gives you information about how to care for yourself after your procedure. Your health care provider may also give you more specific instructions. If you have problems or questions, contact your health care provider. What can I expect after the procedure?  After the procedure, it is common to have: Bruising that usually fades within 1-2 weeks. Tenderness at the site. Follow these instructions at home: Wound care Follow instructions from your health care provider about how to take care of your insertion site. Make sure you: Wash your hands with soap and water before you change your bandage (dressing). If soap and water are not available, use hand sanitizer. Remove your dressing as told by your health care provider. Do not take baths, swim, or use a hot tub until your health care provider approves. You may shower 24-48 hours after the procedure or as told by your health care provider. Gently wash the site with plain soap and water. Pat the area dry with a clean towel. Do not rub the site. This may cause bleeding. Do not apply powder or lotion to the site. Keep the site clean and dry. Check your femoral site every day for signs of infection. Check for: Redness, swelling, or pain. Fluid or blood. Warmth. Pus or a bad smell. Activity For the first 2-3 days after your procedure, or as long as directed: Avoid climbing stairs as much as possible. Do not squat. Do not lift anything that is heavier than 10 lb (4.5 kg), or the limit that you are told, until your health care provider says that it is safe. For 5 days Rest as directed. Avoid sitting for a long time without moving. Get up to take short walks every 1-2 hours. Do not drive for 24 hours if you were given a medicine to help you relax (sedative). General instructions Take over-the-counter and prescription medicines only as told by your health care provider. Keep all follow-up visits as told by your health  care provider. This is important. Contact a health care provider if you have: A fever or chills. You have redness, swelling, or pain around your insertion site. Get help right away if: The catheter insertion area swells very fast. You pass out. You suddenly start to sweat or your skin gets clammy. The catheter insertion area is bleeding, and the bleeding does not stop when you hold steady pressure on the area. The area near or just beyond the catheter insertion site becomes pale, cool, tingly, or numb. These symptoms may represent a serious problem that is an emergency. Do not wait to see if the symptoms will go away. Get medical help right away. Call your local emergency services (911 in the [place] Do not drive yourself to the hospital. Summary After the procedure, it is common to have bruising that usually fades within 1-2 weeks. Check your femoral site every day for signs of infection. Do not lift anything that is heavier than 10 lb (4.5 kg), or the limit that you are told, until your health care provider says that it is safe. This information is not intended to replace advice given to you by your health care provider. Make sure you discuss any questions you have with your health care provider. Document Revised: 10/11/2017 Document Reviewed: 10/11/2017 Elsevier Patient Education  2020 [employer].

## 2021-06-04 ENCOUNTER — Ambulatory Visit: Payer: Medicare Other

## 2021-07-08 ENCOUNTER — Encounter (INDEPENDENT_AMBULATORY_CARE_PROVIDER_SITE_OTHER): Payer: Self-pay | Admitting: *Deleted

## 2021-07-10 ENCOUNTER — Encounter: Payer: Self-pay | Admitting: Vascular Surgery

## 2021-07-10 ENCOUNTER — Ambulatory Visit (INDEPENDENT_AMBULATORY_CARE_PROVIDER_SITE_OTHER): Payer: Medicare Other | Admitting: Vascular Surgery

## 2021-07-10 ENCOUNTER — Other Ambulatory Visit: Payer: Self-pay

## 2021-07-10 VITALS — BP 134/85 | HR 83 | Temp 98.3°F | Resp 20 | Ht 67.0 in | Wt 194.7 lb

## 2021-07-10 DIAGNOSIS — I739 Peripheral vascular disease, unspecified: Secondary | ICD-10-CM

## 2021-07-10 NOTE — Progress Notes (Signed)
Patient name: [name] MRN: [id number] DOB: 13-Nov-1950 Sex: male  REASON FOR VISIT:   Follow-up of gangrene of the right first and fifth toes.  HPI:   [name] is a pleasant 70 y.o. male who would presented with gangrene of the right first and fifth toes.  He had evidence of infrainguinal arterial occlusive disease.  He has a left above-the-knee amputation.  He underwent an arteriogram on 05/09/2021.  On the right side the common iliac, external iliac, and hypogastric arteries were patent.  There was some mild narrowing at the proximal right common iliac artery but I measured a pressure gradient and this did not appear to be significant.  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  The deep femoral artery and superficial femoral artery were patent.  There was moderate diffuse disease throughout the superficial femoral artery but no critical stenosis.  The popliteal artery was patent.  There is single-vessel runoff on the right via the posterior tibial artery with only mild disease noted.  Today he has no specific complaints.  He denies fever.  He denies drainage or erythema.   Current Outpatient Medications  Medication Sig Dispense Refill   acetaminophen (TYLENOL) 325 MG tablet Take 2 tablets (650 mg total) by mouth every 6 (six) hours as needed for mild pain or moderate pain. 100 tablet 2   amiodarone (PACERONE) 200 MG tablet Take 1 tablet (200 mg total) by mouth daily. 90 tablet 2   apixaban (ELIQUIS) 5 MG TABS tablet Take 1 tablet (5 mg total) by mouth 2 (two) times daily. 60 tablet 6   atorvastatin (LIPITOR) 40 MG tablet Take 1 tablet (40 mg total) by mouth daily. (0900) 30 tablet 1   docusate sodium (COLACE) 100 MG capsule Take 100 mg by mouth 2 (two) times daily. (0800 & 1700)     donepezil (ARICEPT) 5 MG tablet Take 1 tablet (5 mg total) by mouth at bedtime. (2100)     finasteride (PROSCAR) 5 MG tablet Take 5 mg by mouth daily at 12  noon.      furosemide (LASIX) 20 MG tablet Take 10 mg by mouth daily at 12 noon.     furosemide (LASIX) 40 MG tablet Take 0.5 tablets (20 mg total) by mouth daily at 12 noon. 30 tablet    insulin aspart (NOVOLOG) 100 UNIT/ML injection Inject 0-20 Units into the skin 4 (four) times daily -  before meals and at bedtime. Per sliding scale tid  0-120=0 units, 121-150=3 units, 151-200=4 units, 201-250= 7 units, 351-300= 11 units, 301-350= 15 units, 351-400= 20 units     losartan (COZAAR) 25 MG tablet Take 1 tablet (25 mg total) by mouth daily. 90 tablet 3   magnesium hydroxide (MILK OF MAGNESIA) 400 MG/5ML suspension Take 30 mLs by mouth daily as needed (bowel management).     magnesium oxide (MAG-OX) 400 MG tablet Take 400 mg by mouth daily. (0900)     Maltodextrin-Xanthan Gum (RESOURCE THICKENUP CLEAR) POWD Nectar thick with all liquids     melatonin 3 MG TABS tablet Take 3 mg by mouth at bedtime. (2000)     memantine (NAMENDA) 5 MG tablet Take 1 tablet (5 mg total) by mouth 2 (two) times daily. (0900 & 2100)     metoprolol tartrate (LOPRESSOR) 25 MG tablet Take 0.5 tablets (12.5 mg total) by mouth 2 (two) times daily. (0900 & 2100) 30 tablet 0   naloxegol oxalate (MOVANTIK) 25  MG TABS tablet Take 1 tablet (25 mg total) by mouth daily. 90 tablet 3   pantoprazole (PROTONIX) 40 MG tablet Take 1 tablet (40 mg total) by mouth daily. 90 tablet 3   polyethylene glycol (MIRALAX / GLYCOLAX) 17 g packet Take 17 g by mouth daily. (0600)     povidone-Iodine (BETADINE) 5 % SOLN topical solution Apply 1 application topically See admin instructions. Paint 5th toe with betadine qd for gangrene     pregabalin (LYRICA) 25 MG capsule Take 1 capsule (25 mg total) by mouth daily. (0900)     PROTEIN PO Take 30 mLs by mouth 2 (two) times daily. For wound healing (0900 & 2100)     traMADol (ULTRAM) 50 MG tablet Take 1 tablet (50 mg total) by mouth every 6 (six) hours as needed for moderate pain or severe pain. 10 tablet 0    No current facility-administered medications for this visit.    REVIEW OF SYSTEMS:  [X]  denotes positive finding, [ ]  denotes negative finding Vascular    Leg swelling    Cardiac    Chest pain or chest pressure:    Shortness of breath upon exertion:    Short of breath when lying flat:    Irregular heart rhythm:    Constitutional    Fever or chills:     PHYSICAL EXAM:   Vitals:   07/10/21 1045  BP: 134/85  Pulse: 83  Resp: 20  Temp: 98.3 F (36.8 C)  SpO2: 98%  Weight: 194 lb 11.2 oz (88.3 kg)  Height: 5\' 7"  (1.702 m)    GENERAL: The patient is a well-nourished male, in no acute distress. The vital signs are documented above. CARDIOVASCULAR: There is a regular rate and rhythm. PULMONARY: There is good air exchange bilaterally without wheezing or rales. He has a left above-the-knee amputation. On the right he has a brisk posterior tibial signal with the Doppler. The wounds on his toes are stable and it appears that the fifth toe starting to auto amputate.        DATA:   The results of his arteriogram are discussed above.  MEDICAL ISSUES:   DRY GANGRENE RIGHT FIRST AND FIFTH TOES: The toes are improving and we will hold off on toe amputation unless he develops drainage or erythema.  It looks like the tip of the fifth toe may auto amputate.  He has excellent perfusion through the posterior tibial artery with a patent plantar arch based on his arteriogram.  I do not see any options for an endovascular approach that would significantly impact his distal perfusion.  We will see him back in 2 months.  He knows to call sooner if he has problems.  I instructed the nursing home to keep the wounds clean and dry.  Deitra Mayo [facility] [phone]

## 2021-07-14 ENCOUNTER — Other Ambulatory Visit (INDEPENDENT_AMBULATORY_CARE_PROVIDER_SITE_OTHER): Payer: Self-pay | Admitting: *Deleted

## 2021-07-14 DIAGNOSIS — K5903 Drug induced constipation: Secondary | ICD-10-CM

## 2021-07-14 MED ORDER — NALOXEGOL OXALATE 25 MG PO TABS: 25.0000 mg | ORAL_TABLET | Freq: Every day | ORAL | 3 refills | Status: AC
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
## 2021-07-17 ENCOUNTER — Ambulatory Visit (INDEPENDENT_AMBULATORY_CARE_PROVIDER_SITE_OTHER): Payer: Medicare Other | Admitting: Student

## 2021-07-17 ENCOUNTER — Other Ambulatory Visit: Payer: Self-pay

## 2021-07-17 ENCOUNTER — Encounter: Payer: Self-pay | Admitting: Student

## 2021-07-17 VITALS — BP 114/56 | HR 66 | Ht 67.0 in | Wt 195.0 lb

## 2021-07-17 DIAGNOSIS — I255 Ischemic cardiomyopathy: Secondary | ICD-10-CM

## 2021-07-17 DIAGNOSIS — I4892 Unspecified atrial flutter: Secondary | ICD-10-CM | POA: Diagnosis not present

## 2021-07-17 DIAGNOSIS — D692 Other nonthrombocytopenic purpura: Secondary | ICD-10-CM

## 2021-07-17 DIAGNOSIS — E118 Type 2 diabetes mellitus with unspecified complications: Secondary | ICD-10-CM

## 2021-07-17 DIAGNOSIS — I2510 Atherosclerotic heart disease of native coronary artery without angina pectoris: Secondary | ICD-10-CM | POA: Diagnosis not present

## 2021-07-17 DIAGNOSIS — I10 Essential (primary) hypertension: Secondary | ICD-10-CM

## 2021-07-17 DIAGNOSIS — G4733 Obstructive sleep apnea (adult) (pediatric): Secondary | ICD-10-CM

## 2021-07-17 DIAGNOSIS — E785 Hyperlipidemia, unspecified: Secondary | ICD-10-CM

## 2021-07-17 DIAGNOSIS — I739 Peripheral vascular disease, unspecified: Secondary | ICD-10-CM

## 2021-07-17 DIAGNOSIS — Z794 Long term (current) use of insulin: Secondary | ICD-10-CM

## 2021-07-17 DIAGNOSIS — I5042 Chronic combined systolic (congestive) and diastolic (congestive) heart failure: Secondary | ICD-10-CM

## 2021-07-17 DIAGNOSIS — I779 Disorder of arteries and arterioles, unspecified: Secondary | ICD-10-CM

## 2021-07-17 LAB — CBC
Hematocrit: 32.8 % — ABNORMAL LOW (ref 37.5–51.0)
Hemoglobin: 10.8 g/dL — ABNORMAL LOW (ref 13.0–17.7)
MCH: 30 pg (ref 26.6–33.0)
MCHC: 32.9 g/dL (ref 31.5–35.7)
MCV: 91 fL (ref 79–97)
Platelets: 199 10*3/uL (ref 150–450)
RBC: 3.6 x10E6/uL — ABNORMAL LOW (ref 4.14–5.80)
RDW: 13.7 % (ref 11.6–15.4)
WBC: 8.1 10*3/uL (ref 3.4–10.8)

## 2021-07-17 MED ORDER — METOPROLOL SUCCINATE ER 25 MG PO TB24: 25.0000 mg | ORAL_TABLET | Freq: Every day | ORAL | 3 refills | Status: AC

## 2021-07-17 NOTE — Patient Instructions (Addendum)
Medication Instructions:  STOP Metoprolol Tartrate (Lopressor)  START Metoprolol Succinate (Toprol-XL) 25 mg daily *If you need a refill on your cardiac medications before your next appointment, please call your pharmacy*  Lab Work: Your physician recommends that you return for lab work TODAY:  CBC  If you have labs (blood work) drawn today and your tests are completely normal, you will receive your results only by: [place] (if you have MyChart) OR A paper copy in the mail If you have any lab test that is abnormal or we need to change your treatment, we will call you to review the results.  Testing/Procedures: Your physician has requested that you have an echocardiogram. Echocardiography is a painless test that uses sound waves to create images of your heart. It provides your doctor with information about the size and shape of your heart and how well your heart's chambers and valves are working. This procedure takes approximately one hour. There are no restrictions for this procedure.  Please schedule for 2-3 weeks at [facility] office   Follow-Up: At [facility], you and your health needs are our priority.  As part of our continuing mission to provide you with exceptional heart care, we have created designated Provider Care Teams.  These Care Teams include your primary Cardiologist (physician) and Advanced Practice Providers (APPs -  Physician Assistants and Nurse Practitioners) who all work together to provide you with the care you need, when you need it.  We recommend signing up for the patient portal called "MyChart".  Sign up information is provided on this After Visit Summary.  MyChart is used to connect with patients for Virtual Visits (Telemedicine).  Patients are able to view lab/test results, encounter notes, upcoming appointments, etc.  Non-urgent messages can be sent to your provider as well.   To learn more about what you can do with MyChart, go to  [email or web address].    Your next appointment:   As scheduled   The format for your next appointment:   In Person  Provider:   Kirk Ruths, MD  Other Instructions

## 2021-08-19 ENCOUNTER — Ambulatory Visit (HOSPITAL_COMMUNITY): Admission: RE | Admit: 2021-08-19 | Payer: Medicare Other | Source: Ambulatory Visit
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
## 2021-08-28 ENCOUNTER — Ambulatory Visit: Payer: Medicare Other

## 2021-09-08 ENCOUNTER — Ambulatory Visit: Payer: Medicare Other | Admitting: Cardiology

## 2021-09-11 DEATH — deceased
